# Patient Record
Sex: Male | Born: 1960 | Race: Black or African American | Hispanic: No | Marital: Married | Smoking: Former smoker
Health system: Southern US, Community
[De-identification: ages and names within clinical notes are randomized; demographics above are authoritative.]

## PROBLEM LIST (undated history)

## (undated) DIAGNOSIS — I5043 Acute on chronic combined systolic (congestive) and diastolic (congestive) heart failure: Secondary | ICD-10-CM

## (undated) DIAGNOSIS — I2510 Atherosclerotic heart disease of native coronary artery without angina pectoris: Secondary | ICD-10-CM

## (undated) DIAGNOSIS — E782 Mixed hyperlipidemia: Secondary | ICD-10-CM

## (undated) DIAGNOSIS — I10 Essential (primary) hypertension: Secondary | ICD-10-CM

## (undated) DIAGNOSIS — F1010 Alcohol abuse, uncomplicated: Secondary | ICD-10-CM

## (undated) DIAGNOSIS — I160 Hypertensive urgency: Secondary | ICD-10-CM

## (undated) DIAGNOSIS — I509 Heart failure, unspecified: Secondary | ICD-10-CM

## (undated) DIAGNOSIS — I219 Acute myocardial infarction, unspecified: Secondary | ICD-10-CM

## (undated) DIAGNOSIS — I340 Nonrheumatic mitral (valve) insufficiency: Secondary | ICD-10-CM

## (undated) DIAGNOSIS — I429 Cardiomyopathy, unspecified: Secondary | ICD-10-CM

## (undated) DIAGNOSIS — J449 Chronic obstructive pulmonary disease, unspecified: Secondary | ICD-10-CM

## (undated) DIAGNOSIS — E669 Obesity, unspecified: Secondary | ICD-10-CM

## (undated) DIAGNOSIS — E119 Type 2 diabetes mellitus without complications: Secondary | ICD-10-CM

## (undated) DIAGNOSIS — J9601 Acute respiratory failure with hypoxia: Secondary | ICD-10-CM

## (undated) HISTORY — PX: OTHER SURGICAL HISTORY: SHX169

## (undated) HISTORY — DX: Obesity, unspecified: E66.9

## (undated) HISTORY — DX: Acute on chronic combined systolic (congestive) and diastolic (congestive) heart failure: I50.43

## (undated) HISTORY — PX: CARDIAC CATHETERIZATION: SHX172

## (undated) HISTORY — DX: Acute respiratory failure with hypoxia: J96.01

## (undated) HISTORY — DX: Hypertensive urgency: I16.0

## (undated) HISTORY — DX: Type 2 diabetes mellitus without complications: E11.9

---

## 2001-09-08 ENCOUNTER — Ambulatory Visit (HOSPITAL_COMMUNITY): Admission: RE | Admit: 2001-09-08 | Discharge: 2001-09-08 | Payer: Self-pay | Admitting: Family Medicine

## 2001-09-08 ENCOUNTER — Encounter: Payer: Self-pay | Admitting: Family Medicine

## 2003-06-02 ENCOUNTER — Encounter: Payer: Self-pay | Admitting: *Deleted

## 2003-06-02 ENCOUNTER — Emergency Department (HOSPITAL_COMMUNITY): Admission: EM | Admit: 2003-06-02 | Discharge: 2003-06-02 | Payer: Self-pay | Admitting: *Deleted

## 2004-12-29 ENCOUNTER — Ambulatory Visit: Payer: Self-pay | Admitting: Family Medicine

## 2005-01-07 ENCOUNTER — Ambulatory Visit: Payer: Self-pay | Admitting: Family Medicine

## 2005-02-09 ENCOUNTER — Ambulatory Visit: Payer: Self-pay | Admitting: Family Medicine

## 2005-03-13 ENCOUNTER — Ambulatory Visit: Payer: Self-pay | Admitting: Family Medicine

## 2005-05-20 ENCOUNTER — Ambulatory Visit: Payer: Self-pay | Admitting: Family Medicine

## 2005-12-29 ENCOUNTER — Ambulatory Visit: Payer: Self-pay | Admitting: Family Medicine

## 2005-12-31 ENCOUNTER — Encounter (INDEPENDENT_AMBULATORY_CARE_PROVIDER_SITE_OTHER): Payer: Self-pay | Admitting: *Deleted

## 2005-12-31 LAB — CONVERTED CEMR LAB: PSA: 0.33 ng/mL

## 2006-01-06 ENCOUNTER — Ambulatory Visit: Payer: Self-pay | Admitting: *Deleted

## 2006-01-19 ENCOUNTER — Encounter (HOSPITAL_COMMUNITY): Admission: RE | Admit: 2006-01-19 | Discharge: 2006-02-18 | Payer: Self-pay | Admitting: *Deleted

## 2006-01-19 ENCOUNTER — Ambulatory Visit: Payer: Self-pay | Admitting: *Deleted

## 2006-01-21 ENCOUNTER — Ambulatory Visit: Payer: Self-pay | Admitting: *Deleted

## 2006-02-02 ENCOUNTER — Ambulatory Visit: Payer: Self-pay | Admitting: Family Medicine

## 2006-04-22 ENCOUNTER — Ambulatory Visit: Payer: Self-pay | Admitting: Family Medicine

## 2006-04-26 ENCOUNTER — Ambulatory Visit: Payer: Self-pay | Admitting: Family Medicine

## 2006-05-12 ENCOUNTER — Ambulatory Visit: Payer: Self-pay | Admitting: Family Medicine

## 2007-01-20 ENCOUNTER — Ambulatory Visit: Payer: Self-pay | Admitting: Family Medicine

## 2007-03-02 ENCOUNTER — Ambulatory Visit: Payer: Self-pay | Admitting: Family Medicine

## 2007-03-04 ENCOUNTER — Encounter: Payer: Self-pay | Admitting: Family Medicine

## 2007-03-04 LAB — CONVERTED CEMR LAB
Bilirubin Urine: NEGATIVE
Ketones, ur: NEGATIVE mg/dL
Specific Gravity, Urine: 1.022 (ref 1.005–1.03)
Urine Glucose: NEGATIVE mg/dL
Urobilinogen, UA: 1 (ref 0.0–1.0)
pH: 6 (ref 5.0–8.0)

## 2007-03-07 ENCOUNTER — Encounter (INDEPENDENT_AMBULATORY_CARE_PROVIDER_SITE_OTHER): Payer: Self-pay | Admitting: *Deleted

## 2007-03-07 ENCOUNTER — Encounter: Payer: Self-pay | Admitting: Family Medicine

## 2007-03-07 ENCOUNTER — Ambulatory Visit (HOSPITAL_COMMUNITY): Admission: RE | Admit: 2007-03-07 | Discharge: 2007-03-07 | Payer: Self-pay | Admitting: Family Medicine

## 2007-03-07 LAB — CONVERTED CEMR LAB
Albumin: 4.5 g/dL (ref 3.5–5.2)
Alkaline Phosphatase: 74 units/L (ref 39–117)
CO2: 24 meq/L (ref 19–32)
Calcium: 9.4 mg/dL (ref 8.4–10.5)
Creatinine, Ser: 1.04 mg/dL (ref 0.40–1.50)
Hgb A1c MFr Bld: 6.7 % — ABNORMAL HIGH (ref 4.6–6.1)
Indirect Bilirubin: 0.9 mg/dL (ref 0.0–0.9)
LDL Cholesterol: 91 mg/dL (ref 0–99)
Sodium: 139 meq/L (ref 135–145)
Total Bilirubin: 1.1 mg/dL (ref 0.3–1.2)
Total Protein: 7.4 g/dL (ref 6.0–8.3)
VLDL: 59 mg/dL — ABNORMAL HIGH (ref 0–40)

## 2007-03-09 ENCOUNTER — Ambulatory Visit (HOSPITAL_COMMUNITY): Admission: RE | Admit: 2007-03-09 | Discharge: 2007-03-09 | Payer: Self-pay | Admitting: Family Medicine

## 2007-08-03 ENCOUNTER — Ambulatory Visit: Payer: Self-pay | Admitting: Family Medicine

## 2007-12-03 ENCOUNTER — Encounter (INDEPENDENT_AMBULATORY_CARE_PROVIDER_SITE_OTHER): Payer: Self-pay | Admitting: *Deleted

## 2008-02-08 ENCOUNTER — Ambulatory Visit: Payer: Self-pay | Admitting: Family Medicine

## 2008-02-09 ENCOUNTER — Ambulatory Visit: Payer: Self-pay | Admitting: Internal Medicine

## 2008-02-10 ENCOUNTER — Ambulatory Visit: Payer: Self-pay | Admitting: Cardiology

## 2008-02-10 ENCOUNTER — Encounter (HOSPITAL_COMMUNITY): Admission: RE | Admit: 2008-02-10 | Discharge: 2008-03-11 | Payer: Self-pay | Admitting: Internal Medicine

## 2008-03-05 ENCOUNTER — Ambulatory Visit: Payer: Self-pay | Admitting: Internal Medicine

## 2008-03-05 ENCOUNTER — Ambulatory Visit: Admission: RE | Admit: 2008-03-05 | Discharge: 2008-03-05 | Payer: Self-pay | Admitting: Cardiology

## 2008-03-05 ENCOUNTER — Ambulatory Visit (HOSPITAL_COMMUNITY): Admission: RE | Admit: 2008-03-05 | Discharge: 2008-03-05 | Payer: Self-pay | Admitting: Cardiology

## 2008-03-07 ENCOUNTER — Inpatient Hospital Stay (HOSPITAL_BASED_OUTPATIENT_CLINIC_OR_DEPARTMENT_OTHER): Admission: RE | Admit: 2008-03-07 | Discharge: 2008-03-07 | Payer: Self-pay | Admitting: Cardiology

## 2008-03-07 ENCOUNTER — Ambulatory Visit: Payer: Self-pay | Admitting: Cardiology

## 2008-03-08 ENCOUNTER — Ambulatory Visit: Payer: Self-pay | Admitting: Family Medicine

## 2008-03-08 DIAGNOSIS — I10 Essential (primary) hypertension: Secondary | ICD-10-CM

## 2008-03-08 DIAGNOSIS — Z794 Long term (current) use of insulin: Secondary | ICD-10-CM

## 2008-03-08 DIAGNOSIS — E782 Mixed hyperlipidemia: Secondary | ICD-10-CM

## 2008-03-08 DIAGNOSIS — E1165 Type 2 diabetes mellitus with hyperglycemia: Secondary | ICD-10-CM

## 2008-03-14 ENCOUNTER — Ambulatory Visit: Payer: Self-pay | Admitting: Family Medicine

## 2008-03-14 ENCOUNTER — Ambulatory Visit (HOSPITAL_COMMUNITY): Admission: RE | Admit: 2008-03-14 | Discharge: 2008-03-14 | Payer: Self-pay | Admitting: Family Medicine

## 2008-03-14 LAB — CONVERTED CEMR LAB
Albumin: 4.6 g/dL (ref 3.5–5.2)
Bilirubin, Direct: 0.2 mg/dL (ref 0.0–0.3)
Cholesterol: 183 mg/dL (ref 0–200)
HDL: 27 mg/dL — ABNORMAL LOW (ref >39)
LDL Cholesterol: 90 mg/dL (ref 0–99)
Potassium: 3.6 meq/L (ref 3.5–5.3)
Sodium: 142 meq/L (ref 135–145)
Total Protein: 7.4 g/dL (ref 6.0–8.3)

## 2008-03-22 ENCOUNTER — Ambulatory Visit: Payer: Self-pay | Admitting: Internal Medicine

## 2008-08-01 ENCOUNTER — Telehealth: Payer: Self-pay | Admitting: Family Medicine

## 2008-09-21 ENCOUNTER — Encounter: Payer: Self-pay | Admitting: Family Medicine

## 2008-10-17 ENCOUNTER — Ambulatory Visit: Payer: Self-pay | Admitting: Family Medicine

## 2008-10-19 ENCOUNTER — Encounter: Payer: Self-pay | Admitting: Family Medicine

## 2008-10-19 LAB — CONVERTED CEMR LAB
Creatinine, Urine: 161.9 mg/dL
Microalb, Ur: 2.24 mg/dL — ABNORMAL HIGH (ref 0.00–1.89)

## 2008-11-12 ENCOUNTER — Ambulatory Visit: Payer: Self-pay | Admitting: Family Medicine

## 2008-11-17 ENCOUNTER — Encounter: Payer: Self-pay | Admitting: Family Medicine

## 2008-11-17 LAB — CONVERTED CEMR LAB
ALT: 34 units/L (ref 0–53)
AST: 21 units/L (ref 0–37)
Alkaline Phosphatase: 72 units/L (ref 39–117)
BUN: 20 mg/dL (ref 6–23)
Bilirubin, Direct: 0.2 mg/dL (ref 0.0–0.3)
Chloride: 103 meq/L (ref 96–112)
Cholesterol: 168 mg/dL (ref 0–200)
Creatinine, Ser: 1.16 mg/dL (ref 0.40–1.50)
Glucose, Bld: 108 mg/dL — ABNORMAL HIGH (ref 70–99)
HDL: 37 mg/dL — ABNORMAL LOW (ref >39)
Total CHOL/HDL Ratio: 4.5
VLDL: 44 mg/dL — ABNORMAL HIGH (ref 0–40)

## 2008-12-20 ENCOUNTER — Ambulatory Visit: Payer: Self-pay | Admitting: Family Medicine

## 2008-12-20 LAB — CONVERTED CEMR LAB: Glucose, Bld: 196 mg/dL

## 2009-01-21 ENCOUNTER — Ambulatory Visit: Payer: Self-pay | Admitting: Family Medicine

## 2009-01-21 LAB — CONVERTED CEMR LAB: Glucose, Bld: 152 mg/dL

## 2009-02-07 ENCOUNTER — Encounter: Payer: Self-pay | Admitting: Family Medicine

## 2009-03-05 ENCOUNTER — Ambulatory Visit: Payer: Self-pay | Admitting: Family Medicine

## 2009-03-06 ENCOUNTER — Encounter: Payer: Self-pay | Admitting: Family Medicine

## 2009-03-06 LAB — CONVERTED CEMR LAB: Microalb Creat Ratio: 11.7 mg/g (ref 0.0–30.0)

## 2009-05-06 ENCOUNTER — Telehealth: Payer: Self-pay | Admitting: Family Medicine

## 2009-05-09 ENCOUNTER — Ambulatory Visit: Payer: Self-pay | Admitting: Family Medicine

## 2009-05-09 DIAGNOSIS — J42 Unspecified chronic bronchitis: Secondary | ICD-10-CM | POA: Insufficient documentation

## 2009-05-09 LAB — CONVERTED CEMR LAB: Hgb A1c MFr Bld: 8.6 %

## 2009-07-08 ENCOUNTER — Telehealth: Payer: Self-pay | Admitting: Family Medicine

## 2009-11-21 ENCOUNTER — Ambulatory Visit: Payer: Self-pay | Admitting: Family Medicine

## 2009-12-11 ENCOUNTER — Encounter: Payer: Self-pay | Admitting: Family Medicine

## 2009-12-19 LAB — CONVERTED CEMR LAB
ALT: 28 units/L (ref 0–53)
Basophils Absolute: 0 10*3/uL (ref 0.0–0.1)
Basophils Relative: 1 % (ref 0–1)
Calcium: 9.2 mg/dL (ref 8.4–10.5)
Chloride: 100 meq/L (ref 96–112)
Cholesterol: 247 mg/dL — ABNORMAL HIGH (ref 0–200)
HCT: 45.5 % (ref 39.0–52.0)
HDL: 37 mg/dL — ABNORMAL LOW (ref >39)
Hemoglobin: 15.5 g/dL (ref 13.0–17.0)
Lymphocytes Relative: 39 % (ref 12–46)
MCV: 86.2 fL (ref 78.0–100.0)
Monocytes Absolute: 0.5 10*3/uL (ref 0.1–1.0)
Platelets: 196 10*3/uL (ref 150–400)
Potassium: 3.7 meq/L (ref 3.5–5.3)
Sodium: 137 meq/L (ref 135–145)
Total CHOL/HDL Ratio: 6.7
Total Protein: 7 g/dL (ref 6.0–8.3)
Triglycerides: 777 mg/dL — ABNORMAL HIGH (ref <150)

## 2010-01-20 ENCOUNTER — Telehealth: Payer: Self-pay | Admitting: Family Medicine

## 2010-05-06 ENCOUNTER — Ambulatory Visit: Payer: Self-pay | Admitting: Family Medicine

## 2010-05-07 ENCOUNTER — Encounter: Payer: Self-pay | Admitting: Family Medicine

## 2010-05-09 ENCOUNTER — Encounter: Payer: Self-pay | Admitting: Family Medicine

## 2010-05-09 LAB — CONVERTED CEMR LAB: Direct LDL: 58 mg/dL

## 2010-05-14 ENCOUNTER — Encounter: Payer: Self-pay | Admitting: Family Medicine

## 2010-05-19 LAB — CONVERTED CEMR LAB
AST: 17 units/L (ref 0–37)
Alkaline Phosphatase: 74 units/L (ref 39–117)
Bilirubin, Direct: 0.1 mg/dL (ref 0.0–0.3)
Calcium: 9.9 mg/dL (ref 8.4–10.5)
Chloride: 102 meq/L (ref 96–112)
Creatinine, Ser: 1.11 mg/dL (ref 0.40–1.50)
HDL: 37 mg/dL — ABNORMAL LOW (ref >39)
Hgb A1c MFr Bld: 9 % — ABNORMAL HIGH (ref <5.7)
Microalb Creat Ratio: 11.9 mg/g (ref 0.0–30.0)
Potassium: 4.2 meq/L (ref 3.5–5.3)
Sodium: 139 meq/L (ref 135–145)
Total CHOL/HDL Ratio: 6.2

## 2010-07-03 ENCOUNTER — Encounter (INDEPENDENT_AMBULATORY_CARE_PROVIDER_SITE_OTHER): Payer: Self-pay | Admitting: *Deleted

## 2010-08-06 ENCOUNTER — Ambulatory Visit: Payer: Self-pay | Admitting: Family Medicine

## 2010-08-12 ENCOUNTER — Encounter: Payer: Self-pay | Admitting: Family Medicine

## 2010-08-12 DIAGNOSIS — E663 Overweight: Secondary | ICD-10-CM

## 2010-08-12 LAB — CONVERTED CEMR LAB
ALT: 27 units/L (ref 0–53)
BUN: 18 mg/dL (ref 6–23)
CO2: 25 meq/L (ref 19–32)
Calcium: 9.7 mg/dL (ref 8.4–10.5)
Chloride: 101 meq/L (ref 96–112)
Hgb A1c MFr Bld: 10.2 % — ABNORMAL HIGH (ref <5.7)
Indirect Bilirubin: 0.7 mg/dL (ref 0.0–0.9)
Potassium: 3.4 meq/L — ABNORMAL LOW (ref 3.5–5.3)
Sodium: 139 meq/L (ref 135–145)

## 2010-10-01 ENCOUNTER — Telehealth: Payer: Self-pay | Admitting: Family Medicine

## 2010-11-11 ENCOUNTER — Encounter: Payer: Self-pay | Admitting: Family Medicine

## 2010-11-13 ENCOUNTER — Ambulatory Visit: Payer: Self-pay | Admitting: Family Medicine

## 2010-11-13 LAB — CONVERTED CEMR LAB
Bilirubin Urine: NEGATIVE
Nitrite: NEGATIVE
Protein, U semiquant: NEGATIVE
Specific Gravity, Urine: 1.01
WBC Urine, dipstick: NEGATIVE

## 2010-11-14 ENCOUNTER — Encounter: Payer: Self-pay | Admitting: Family Medicine

## 2010-11-14 LAB — CONVERTED CEMR LAB
Alkaline Phosphatase: 87 units/L (ref 39–117)
BUN: 19 mg/dL (ref 6–23)
CO2: 25 meq/L (ref 19–32)
Calcium: 9.7 mg/dL (ref 8.4–10.5)
Chloride: 96 meq/L (ref 96–112)
Creatinine, Ser: 1.13 mg/dL (ref 0.40–1.50)
Glucose, Bld: 284 mg/dL — ABNORMAL HIGH (ref 70–99)
Sodium: 134 meq/L — ABNORMAL LOW (ref 135–145)
Total Bilirubin: 0.8 mg/dL (ref 0.3–1.2)

## 2010-12-12 ENCOUNTER — Telehealth: Payer: Self-pay | Admitting: Family Medicine

## 2010-12-22 ENCOUNTER — Ambulatory Visit
Admission: RE | Admit: 2010-12-22 | Discharge: 2010-12-22 | Payer: Self-pay | Source: Home / Self Care | Attending: Family Medicine | Admitting: Family Medicine

## 2010-12-29 ENCOUNTER — Ambulatory Visit
Admission: RE | Admit: 2010-12-29 | Discharge: 2010-12-29 | Payer: Self-pay | Source: Home / Self Care | Attending: Family Medicine | Admitting: Family Medicine

## 2010-12-29 LAB — CONVERTED CEMR LAB: Glucose, Bld: 187 mg/dL

## 2011-01-04 ENCOUNTER — Encounter: Payer: Self-pay | Admitting: Family Medicine

## 2011-01-11 LAB — CONVERTED CEMR LAB: Glucose, Bld: 133 mg/dL

## 2011-01-13 NOTE — Progress Notes (Signed)
Summary: refill  Phone Note Call from Patient   Summary of Call: needs sugar medicine called into [facility]. [phone] Initial call taken by: Rudene Anda,  October 01, 2010 8:28 AM    Prescriptions: KOMBIGLYZE XR 2.04-999 MG XR24H-TAB (SAXAGLIPTIN-METFORMIN) 2 tablets once daily  #60 x 1   Entered by:   Adella Hare LPN   Authorized by:   Syliva Overman MD   Signed by:   Adella Hare LPN on 45/40/9811   Method used:   Electronically to        [facility]  [address] (retail)       [address]       [place], [place]  [address]       Ph: [phone]       Fax: [phone]   RxID:   [id number]

## 2011-01-13 NOTE — Letter (Signed)
Summary: Letter  Letter   Imported By: Lind Guest 05/14/2010 15:32:36  _____________________________________________________________________  External Attachment:    Type:   Image     Comment:   External Document

## 2011-01-13 NOTE — Letter (Addendum)
Summary: Laboratory/X-Ray Results  [facility]  [address]   [place], [place] [address]   Phone: [phone]  Fax: [phone]    Lab/X-Ray Results  November 14, 2010  MRN: [id number]  [facility] [address] [place], [place]  [address]  Dear Mr. PERCIVAL,  The results of your recent lab has been reviewed and were found: ABNORMAL     I have attempted unsuccessfully to reach you by telephone. Please contact our office.     Adella Hare LPN  Appended Document: Laboratory/X-Ray Results when you spk to the pt,pls find out what his fasting sgars anfd other are,let me know if still over 200 most of the time, i will add extr glucotrol i

## 2011-01-13 NOTE — Assessment & Plan Note (Signed)
Summary: office visit   Vital Signs:  Patient profile:   50 year old male Height:      70 inches Weight:      206.25 pounds BMI:     29.70 O2 Sat:      97 % Pulse rate:   91 / minute Pulse rhythm:   regular Resp:     16 per minute BP sitting:   120 / 90  (left arm)  Vitals Entered By: Everitt Amber LPN (August 06, 2010 4:07 PM)  Nutrition Counseling: Patient's BMI is greater than 25 and therefore counseled on weight management options. CC: Follow up chronic problems   CC:  Follow up chronic problems.  History of Present Illness: Reports  that he has been doing well, staes he both drinks and smokes less Denies recent fever or chills. Denies sinus pressure, nasal congestion , ear pain or sore throat. Denies chest congestion, or cough productive of sputum. Denies chest pain, palpitations, PND, orthopnea or leg swelling. Denies abdominal pain, nausea, vomitting, diarrhea or constipation. Denies change in bowel movements or bloody stool. Denies dysuria , frequency, incontinence or hesitancy. Denies  joint pain, swelling, or reduced mobility. Denies headaches, vertigo, seizures. Denies depression, anxiety or insomnia. Denies  rash, lesions, or itch.     Current Medications (verified): 1)  Avalide 300-25 Mg  Tabs (Irbesartan-Hydrochlorothiazide) .... One Tab By Mouth Every Morning 2)  Proventil Hfa 108 (90 Base) Mcg/act Aers (Albuterol Sulfate) .... 2 Puffs Every 6 To 8 Hours As Needed 3)  Metformin Hcl 1000 Mg Tabs (Metformin Hcl) .... Take 1 Tablet By Mouth Two Times A Day 4)  Glipizide 5 Mg Tabs (Glipizide) .... Take 1 Tablet By Mouth Two Times A Day 5)  Amlodipine Besylate 10 Mg Tabs (Amlodipine Besylate) .... One Tab By Mouth Qd 6)  Spironolactone 25 Mg Tabs (Spironolactone) .... One Tab By Mouth Qd 7)  Metoprolol Succinate 50 Mg Xr24h-Tab (Metoprolol Succinate) .... One Tab By Mouth Once Daily 8)  Klor-Con M20 20 Meq Cr-Tabs (Potassium Chloride Crys Cr) .... One Tab By  Mouth Qd 9)  Vitamin E 200 Unit Caps (Vitamin E) .... One Cap By Mouth Qd 10)  Fish Oil 1000 Mg Caps (Omega-3 Fatty Acids) .... One Cap By Mouth Qd 11)  Garlic 500 Mg Caps (Garlic) .... One Tab By Mouth Qd 12)  Adult Aspirin Low Strength 81 Mg Tbdp (Aspirin) .... One Tab By Mouth Qd 13)  Simvastatin 40 Mg Tabs (Simvastatin) .... Take 1 Tab By Mouth At Bedtime 14)  Hydrochlorothiazide 25 Mg Tabs (Hydrochlorothiazide) .[name].. 1 Daily Q Am 15)  Avapro 300 Mg Tabs (Irbesartan) .[name].. 1 Daily  Allergies (verified): No Known Drug Allergies  Review of Systems      See HPI General:  Complains of fatigue. Eyes:  Complains of blurring; denies discharge, eye pain, and red eye. Endo:  Complains of excessive thirst; denies excessive urination; not testing sugars regularly. Heme:  Denies abnormal bruising and bleeding. Allergy:  Complains of seasonal allergies; denies hives or rash and itching eyes.  Physical Exam  General:  Well-developed,obese,in no acute distress; alert,appropriate and cooperative throughout examination HEENT: No facial asymmetry,  EOMI, No sinus tenderness, TM's Clear, oropharynx  pink and moist.   Chest: Clear to auscultation bilaterally.  CVS: S1, S2, No murmurs, No S3.   Abd: Soft, Nontender.  MS: Adequate ROM spine, hips, shoulders and knees.  Ext: No edema.   CNS: CN 2-12 intact, power tone and sensation normal throughout.  Skin: Intact, no visible lesions or rashes.  Psych: Good eye contact, normal affect.  Memory intact, not anxious or depressed appearing.    Impression & Recommendations:  Problem # 1:  OVERWEIGHT (ICD-278.02) Assessment Unchanged  Ht: 70 (08/06/2010)   Wt: 206.25 (08/06/2010)   BMI: 29.70 (08/06/2010)  Problem # 2:  NICOTINE ADDICTION (ICD-305.1) Assessment: Unchanged  Encouraged smoking cessation and discussed different methods for smoking cessation.   Problem # 3:  HYPERTENSION (ICD-401.9) Assessment: Improved  His updated medication  list for this problem includes:    Avalide 300-25 Mg Tabs (Irbesartan-hydrochlorothiazide) ..... One tab by mouth every morning    Amlodipine Besylate 10 Mg Tabs (Amlodipine besylate) ..... One tab by mouth qd    Spironolactone 25 Mg Tabs (Spironolactone) ..... One tab by mouth qd    Metoprolol Succinate 50 Mg Xr24h-tab (Metoprolol succinate) ..... One tab by mouth once daily    Hydrochlorothiazide 25 Mg Tabs (Hydrochlorothiazide) .[name]... 1 daily q am    Avapro 300 Mg Tabs (Irbesartan) .[name]... 1 daily  Orders: T-Basic Metabolic Panel [id number]) T-Basic Metabolic Panel [phone])  BP today: 120/90 Prior BP: 136/88 (05/06/2010)  Labs Reviewed: K+: 4.2 (05/07/2010) Creat: : 1.11 (05/07/2010)   Chol: 230 (05/07/2010)   HDL: 37 (05/07/2010)   LDL: See Comment mg/dL (29/56/2130)   TG: 865 (05/07/2010)  Problem # 4:  DIABETES MELLITUS, TYPE II, WITHOUT COMPLICATIONS (ICD-250.00) Assessment: Deteriorated  The following medications were removed from the medication list:    Metformin Hcl 1000 Mg Tabs (Metformin hcl) .[name]... Take 1 tablet by mouth two times a day His updated medication list for this problem includes:    Avalide 300-25 Mg Tabs (Irbesartan-hydrochlorothiazide) ..... One tab by mouth every morning    Glipizide 5 Mg Tabs (Glipizide) .[name]... Take 1 tablet by mouth two times a day    Adult Aspirin Low Strength 81 Mg Tbdp (Aspirin) ..... One tab by mouth qd    Avapro 300 Mg Tabs (Irbesartan) .[name]... 1 daily    Kombiglyze Xr 2.04-999 Mg Xr24h-tab (Saxagliptin-metformin) .[name]... 2 tablets once daily  Orders: T- Hemoglobin A1C ([id number]) T- Hemoglobin A1C ([id number])  Labs Reviewed: Creat: 1.11 (05/07/2010)    Reviewed HgBA1c results: 9.0 (05/07/2010)  9.5 (11/21/2009)  Complete Medication List: 1)  Avalide 300-25 Mg Tabs (Irbesartan-hydrochlorothiazide) .... One tab by mouth every morning 2)  Proventil Hfa 108 (90 Base) Mcg/act Aers (Albuterol sulfate) .... 2 puffs every 6 to 8  hours as needed 3)  Glipizide 5 Mg Tabs (Glipizide) .... Take 1 tablet by mouth two times a day 4)  Amlodipine Besylate 10 Mg Tabs (Amlodipine besylate) .... One tab by mouth qd 5)  Spironolactone 25 Mg Tabs (Spironolactone) .... One tab by mouth qd 6)  Metoprolol Succinate 50 Mg Xr24h-tab (Metoprolol succinate) .... One tab by mouth once daily 7)  Klor-con M20 20 Meq Cr-tabs (Potassium chloride crys cr) .... One tab by mouth qd 8)  Vitamin E 200 Unit Caps (Vitamin e) .... One cap by mouth qd 9)  Fish Oil 1000 Mg Caps (Omega-3 fatty acids) .... One cap by mouth qd 10)  Garlic 500 Mg Caps (Garlic) .... One tab by mouth qd 11)  Adult Aspirin Low Strength 81 Mg Tbdp (Aspirin) .... One tab by mouth qd 12)  Hydrochlorothiazide 25 Mg Tabs (Hydrochlorothiazide) .[name].. 1 daily q am 13)  Avapro 300 Mg Tabs (Irbesartan) .[name].. 1 daily 14)  Lipitor 40 Mg Tabs (Atorvastatin calcium) .... Take 1 tab by mouth at bedtime 15)  Tessalon Perles 100 Mg Caps (Benzonatate) .... Take 1 capsule by mouth three times a day 16)  Penicillin V Potassium 500 Mg Tabs (Penicillin v potassium) .... Take 1 tablet by mouth three times a day 17)  Kombiglyze Xr 2.04-999 Mg Xr24h-tab (Saxagliptin-metformin) .... 2 tablets once daily  Other Orders: T-Hepatic Function [id number]) T-Hepatic Function [phone]) T-Lipid Profile [id number])  Patient Instructions: 1)  Please schedule a follow-up appointment in 3 months. 2)  It is important that you exercise regularly at least 20 minutes 5 times a week. If you develop chest pain, have severe difficulty breathing, or feel very tired , stop exercising immediately and seek medical attention. 3)  You need to lose weight. Consider a lower calorie diet and regular exercise.  4)  It is not healthy  for men to drink more than 2-3 drinks per day or for women to drink more than 1-2 drinks per day. 5)  BMP prior to visit, ICD-9: 6)  Hepatic Panel prior to visit, ICD-9:   today 7)  HbgA1C  prior to visit, ICD-9: 8)  PLS change your eating as discussed Prescriptions: KOMBIGLYZE XR 2.04-999 MG XR24H-TAB (SAXAGLIPTIN-METFORMIN) 2 tablets once daily  #60 x 3   Entered and Authorized by:   Syliva Overman MD   Signed by:   Syliva Overman MD on 08/07/2010   Method used:   Historical   RxID:   [id number] AVAPRO 300 MG TABS (IRBESARTAN) 1 daily  #90 x 3   Entered by:   Adella Hare LPN   Authorized by:   Syliva Overman MD   Signed by:   Adella Hare LPN on 44/12/270   Method used:   Electronically to        [facility] (retail)       [address]       [place], [place]  [address]       Ph: [phone]       Fax: [phone]   RxID:   [id number] HYDROCHLOROTHIAZIDE 25 MG TABS (HYDROCHLOROTHIAZIDE) 1 daily q am  #90 x 3   Entered by:   Adella Hare LPN   Authorized by:   Syliva Overman MD   Signed by:   Adella Hare LPN on 10/93/2355   Method used:   Electronically to        [facility] (retail)       [address]       [place], [place]  [address]       Ph: [phone]       Fax: [phone]   RxID:   [id number] KLOR-CON M20 20 MEQ CR-TABS (POTASSIUM CHLORIDE CRYS CR) one tab by mouth qd  #30 Tablet x 3   Entered by:   Adella Hare LPN   Authorized by:   Syliva Overman MD   Signed by:   Adella Hare LPN on 27/02/5008   Method used:   Electronically to        [facility] [address] (retail)       [address]       [place], [place]  [address]       Ph: [phone]       Fax: [phone]   RxID:   [id number] METOPROLOL SUCCINATE 50 MG XR24H-TAB (METOPROLOL SUCCINATE) one tab by mouth once daily  #30 Tablet x 3  Entered by:   Adella Hare LPN   Authorized by:   Syliva Overman MD   Signed by:   Adella Hare LPN on 36/64/4034   Method used:   Electronically to        [facility] [address] (retail)       [address]       [place], [place]  [address]       Ph: [phone]       Fax: [phone]   RxID:   [id number] AMLODIPINE BESYLATE 10 MG TABS (AMLODIPINE BESYLATE) one tab by mouth qd  #30 Tablet x 3   Entered by:   Adella Hare LPN   Authorized by:   Syliva Overman MD   Signed by:   Adella Hare LPN on 32/20/2542   Method used:   Electronically to        [facility] [address] (retail)       [address]       [place], [place]  [address]       Ph: [phone]       Fax: [phone]   RxID:   [id number] PENICILLIN V POTASSIUM 500 MG TABS (PENICILLIN V POTASSIUM) Take 1 tablet by mouth three times a day  #30 x 0   Entered and Authorized by:   Syliva Overman MD   Signed by:   Syliva Overman MD on 08/06/2010   Method used:   Electronically to        [facility] [address] (retail)       [address]       [place], [place]  [address]       Ph: [phone]       Fax: [phone]   RxID:   [id number] TESSALON PERLES 100 MG CAPS (BENZONATATE) Take 1 capsule by mouth three times a day  #30 x 0   Entered and Authorized by:   Syliva Overman MD   Signed by:   Syliva Overman MD on 08/06/2010   Method used:   Electronically to        [facility] [address] (retail)       [address]       [place], [place]  [address]       Ph: [phone]       Fax: [phone]   RxID:   [id number] LIPITOR 40 MG TABS (ATORVASTATIN CALCIUM) Take 1 tab by mouth at bedtime  #30 x 3   Entered and Authorized by:   Syliva Overman MD   Signed by:   Syliva Overman MD on 08/06/2010   Method used:   Printed then faxed to ...       [facility] (retail)       [address]       [place], [place]  [address]       Ph: [phone]       Fax: [phone]   RxID:   [id number]

## 2011-01-13 NOTE — Letter (Signed)
Summary: 1st Missed Appt.  [facility]  [address]   [place], [place] [address]   Phone: [phone]  Fax: [phone]    July 03, 2010  MRN: [id number]  [facility] [address] [place], [place]  [address]  Dear Antonio Cobb,  At [facility], we make every attempt to fit patients into our schedule by reserving several appointment slots for same-day appointments.  However, we cannot always make appointments for patients the same day they are calling.  At the end of the day, we look back at our schedule and find that because of last-minute cancellations and patients not showing up for their scheduled appointments, we have several appointment slots that are left open and could have been used by another person who really needed it.  In the past, you may have been one of the patients who could not get in when you needed to.  But recently, you were one of the patients with an appointment that you didn't show up for or canceled too late for [place] to fill it.  We choose not to charge no-show or last minute cancellation fees to our patients, like many other offices do.  We do not wish to institute that policy and hope we never have to.  However, we kindly request that you assist [place] by providing at least 24 hours' notice if you can't make your appointment.  If no-shows or late cancellations become habitual (i.e. Three or more in a one-year period), we may terminate the physician-patient relationship.    Thank you for your consideration and cooperation.   Luann Bullins

## 2011-01-13 NOTE — Assessment & Plan Note (Signed)
Summary: OV   Vital Signs:  Patient profile:   50 year old male Height:      70 inches Weight:      197.50 pounds BMI:     28.44 O2 Sat:      98 % on Room air Pulse rate:   93 / minute Pulse rhythm:   regular Resp:     16 per minute BP sitting:   140 / 100  (left arm)  Vitals Entered By: Adella Hare LPN (November 13, 2010 4:02 PM)  Nutrition Counseling: Patient's BMI is greater than 25 and therefore counseled on weight management options.  O2 Flow:  Room air CC: sugars are high Is Patient Diabetic? Yes Did you bring your meter with you today? No Pain Assessment Patient in pain? no      Comments did not bring meds to ov    CC:  sugars are high.  History of Present Illness: 3 weeks h/o inc fatigue, poyuria, polydypsia, dry mouth, sugar too high to read. Pt continues to drink alcohol, and remaijns non compliant with the treatment plan. He denies any recent fever or chills, head or chest congestion. He denies dysuria. He denies depression or anxiety. He denies abdominal pain, diarreah,nausea,vomitting or constipation. He denies joint pain, stiffness, or swelling. He denies rash, lesion , skin breakdown or itch.  Preventive Screening-Counseling & Management  Alcohol-Tobacco     Smoking Cessation Counseling: yes  Allergies (verified): No Known Drug Allergies  Review of Systems      See HPI General:  Complains of fatigue, weakness, and weight loss; denies chills and fever; 1 wek history. Eyes:  Complains of blurring; denies eye pain and red eye. ENT:  Denies earache, hoarseness, nasal congestion, postnasal drainage, and sinus pressure. CV:  Denies chest pain or discomfort, near fainting, palpitations, shortness of breath with exertion, and swelling of feet. Resp:  Complains of cough; denies shortness of breath, sputum productive, and wheezing; chronic smokers cough. GI:  Denies abdominal pain, constipation, diarrhea, nausea, and vomiting. GU:  Complains of urinary  frequency; denies dysuria and urinary hesitancy. Neuro:  Denies headaches, memory loss, seizures, and sensation of room spinning. Endo:  Complains of excessive thirst, excessive urination, and weight change; dry mouth, nasty taste in mouth for 5 days. Heme:  Denies abnormal bruising and bleeding. Allergy:  Denies hives or rash and itching eyes.  Physical Exam  General:  Well-developed,well-nourished,in no acute distress; alert,appropriate and cooperative throughout examination HEENT: No facial asymmetry,  EOMI, No sinus tenderness, TM's Clear, oropharynx  pink and moist.   Chest: Clear to auscultation bilaterally.  CVS: S1, S2, No murmurs, No S3.   Abd: Soft, Nontender.  MS: Adequate ROM spine, hips, shoulders and knees.  Ext: No edema.   CNS: CN 2-12 intact, power tone and sensation normal throughout.   Skin: Intact, no visible lesions or rashes.  Psych: Good eye contact, normal affect.  Memory intact, not anxious or depressed appearing.    Impression & Recommendations:  Problem # 1:  ALCOHOL USE (ICD-305.00) Assessment Unchanged the absolute need to discontinue alcohol use is again stressed  Problem # 2:  NICOTINE ADDICTION (ICD-305.1) Assessment: Unchanged  Encouraged smoking cessation and discussed different methods for smoking cessation.   Problem # 3:  DIABETES MELLITUS, TYPE II, WITHOUT COMPLICATIONS (ICD-250.00) Assessment: Deteriorated  The following medications were removed from the medication list:    Glipizide 5 Mg Tabs (Glipizide) .[name]... Take 1 tablet by mouth two times a day His updated  medication list for this problem includes:    Avalide 300-25 Mg Tabs (Irbesartan-hydrochlorothiazide) ..... One tab by mouth every morning    Adult Aspirin Low Strength 81 Mg Tbdp (Aspirin) ..... One tab by mouth qd    Avapro 300 Mg Tabs (Irbesartan) .[name]... 1 daily    Kombiglyze Xr 2.04-999 Mg Xr24h-tab (Saxagliptin-metformin) .[name]... 2 tablets once daily    Glipizide 10 Mg  Xr24h-tab (Glipizide) .[name]... Take 1 tablet by mouth once a day  Orders: Glucose, (CBG) (82962) T- Hemoglobin A1C (14782-95621) Insulin 5 units (H0865) Admin of Therapeutic Inj  intramuscular or subcutaneous (78469)  Labs Reviewed: Creat: 1.10 (08/06/2010)    Reviewed HgBA1c results: 10.2 (08/06/2010)  9.0 (05/07/2010)  Problem # 4:  HYPERLIPIDEMIA (ICD-272.4) Assessment: Comment Only  His updated medication list for this problem includes:    Lipitor 40 Mg Tabs (Atorvastatin calcium) .[name]... Take 1 tab by mouth at bedtime Low fat dietdiscussed and encouraged  Labs Reviewed: SGOT: 23 (08/06/2010)   SGPT: 27 (08/06/2010)   HDL:37 (05/07/2010), 37 (12/11/2009)  LDL:See Comment mg/dL (62/95/2841), See Comment mg/dL (32/44/0102)  VOZD:664 (05/07/2010), 247 (12/11/2009)  Trig:651 (05/07/2010), 777 (12/11/2009)  Problem # 5:  HYPERTENSION (ICD-401.9) Assessment: Deteriorated  The following medications were removed from the medication list:    Spironolactone 25 Mg Tabs (Spironolactone) ..... One tab by mouth qd His updated medication list for this problem includes:    Avalide 300-25 Mg Tabs (Irbesartan-hydrochlorothiazide) ..... One tab by mouth every morning    Amlodipine Besylate 10 Mg Tabs (Amlodipine besylate) ..... One tab by mouth qd    Metoprolol Succinate 50 Mg Xr24h-tab (Metoprolol succinate) ..... One tab by mouth once daily    Hydrochlorothiazide 25 Mg Tabs (Hydrochlorothiazide) .[name]... 1 daily q am    Avapro 300 Mg Tabs (Irbesartan) .[name]... 1 daily    Spironolactone 25 Mg Tabs (Spironolactone) .[name]... Take 1 tablet by mouth once a day  Orders: T-CMP with estimated GFR ([address]) Urinalysis (81003-65000)  BP today: 140/100 Prior BP: 120/90 (08/06/2010)  Labs Reviewed: K+: 3.4 (08/06/2010) Creat: : 1.10 (08/06/2010)   Chol: 230 (05/07/2010)   HDL: 37 (05/07/2010)   LDL: See Comment mg/dL (56/38/7564)   TG: 332 (05/07/2010)  Complete Medication List: 1)  Avalide 300-25 Mg  Tabs (Irbesartan-hydrochlorothiazide) .... One tab by mouth every morning 2)  Proventil Hfa 108 (90 Base) Mcg/act Aers (Albuterol sulfate) .... 2 puffs every 6 to 8 hours as needed 3)  Amlodipine Besylate 10 Mg Tabs (Amlodipine besylate) .... One tab by mouth qd 4)  Metoprolol Succinate 50 Mg Xr24h-tab (Metoprolol succinate) .... One tab by mouth once daily 5)  Klor-con M20 20 Meq Cr-tabs (Potassium chloride crys cr) .... One tab by mouth qd 6)  Vitamin E 200 Unit Caps (Vitamin e) .... One cap by mouth qd 7)  Fish Oil 1000 Mg Caps (Omega-3 fatty acids) .... One cap by mouth qd 8)  Garlic 500 Mg Caps (Garlic) .... One tab by mouth qd 9)  Adult Aspirin Low Strength 81 Mg Tbdp (Aspirin) .... One tab by mouth qd 10)  Hydrochlorothiazide 25 Mg Tabs (Hydrochlorothiazide) .[name].. 1 daily q am 11)  Avapro 300 Mg Tabs (Irbesartan) .[name].. 1 daily 12)  Lipitor 40 Mg Tabs (Atorvastatin calcium) .... Take 1 tab by mouth at bedtime 13)  Kombiglyze Xr 2.04-999 Mg Xr24h-tab (Saxagliptin-metformin) .... 2 tablets once daily 14)  Glipizide 10 Mg Xr24h-tab (Glipizide) .... Take 1 tablet by mouth once a day 15)  Spironolactone 25 Mg Tabs (Spironolactone) .... Take  1 tablet by mouth once a day  Patient Instructions: 1)  F/u in 6 to 8 weeks 2)  BMP prior to visit, ICD-9: 3)  HbgA1C prior to visit, ICD-9:  today. 4)  PLS take glipizide 10mg  one daily at breakfast, start tomorrow 5)  Continue kombiglyze 2 daily 6)  Spironolactone is also sent in your bP is high 7)  Tobacco is very bad for your health and your loved ones! You Should stop smoking!. 8)  It is not healthy  for men to drink more than 2-3 drinks per day or for women to drink more than 1-2 drinks per day. Prescriptions: SPIRONOLACTONE 25 MG TABS (SPIRONOLACTONE) Take 1 tablet by mouth once a day  #30 x 3   Entered and Authorized by:   Syliva Overman MD   Signed by:   Syliva Overman MD on 11/13/2010   Method used:   Printed then faxed to ...        [facility] [address] [facility] (retail)       [address]       [place], [place]  [address]       Ph: [phone]       Fax: [phone]   RxID:   [id number] GLIPIZIDE 10 MG XR24H-TAB (GLIPIZIDE) Take 1 tablet by mouth once a day  #30 x 3   Entered and Authorized by:   Syliva Overman MD   Signed by:   Syliva Overman MD on 11/13/2010   Method used:   Printed then faxed to ...       [facility] [address] [facility] (retail)       [address]       [place], [place]  [address]       Ph: [phone]       Fax: [phone]   RxID:   [id number]    Medication Administration  Injection # 1:    Medication: Insulin 5 units    Diagnosis: DIABETES MELLITUS, TYPE II, WITHOUT COMPLICATIONS (ICD-250.00)    Route: SQ    Site: R deltoid    Exp Date: 11/13    Lot #: [id number]    Mfr: novo nordisk    Comments: 7 units given    Patient tolerated injection without complications    Given by: Adella Hare LPN (November 13, 2010 5:01 PM)  Orders Added: 1)  Glucose, (CBG) [82962] 2)  Est. Patient Level IV [10932] 3)  T-CMP with estimated GFR [[address]] 4)  T- Hemoglobin A1C [83036-23375] 5)  Urinalysis [81003-65000] 6)  Insulin 5 units [J1815] 7)  Admin of Therapeutic Inj  intramuscular or subcutaneous [96372]    Laboratory Results   Urine Tests  Date/Time Received: November 13, 2010 5:00 PM  Date/Time Reported: November 13, 2010 5:00 PM   Routine Urinalysis   Color: yellow Appearance: Clear Glucose: >=1000   (Normal Range: Negative) Bilirubin: negative   (Normal Range: Negative) Ketone: trace (5)   (Normal Range: Negative) Spec. Gravity: 1.010   (Normal Range: 1.003-1.035) Blood: negative   (Normal Range: Negative) pH: 5.0   (Normal Range: 5.0-8.0) Protein: negative   (Normal Range: Negative) Urobilinogen: 0.2   (Normal Range: 0-1) Nitrite: negative   (Normal Range: Negative) Leukocyte Esterace: negative   (Normal Range: Negative)     Blood  Tests   Date/Time Received: November 13, 2010 4:03 PM  Date/Time Reported: November 13, 2010 4:03 PM   Glucose (random): 385 mg/dL   (Normal Range: 16-109)        Medication Administration  Injection # 1:    Medication: Insulin 5 units    Diagnosis: DIABETES MELLITUS, TYPE II, WITHOUT COMPLICATIONS (ICD-250.00)    Route: SQ    Site: R deltoid    Exp Date: 11/13    Lot #: [id number]    Mfr: novo nordisk    Comments: 7 units given    Patient tolerated injection without complications    Given by: Adella Hare LPN (November 13, 2010 5:01 PM)  Orders Added: 1)  Glucose, (CBG) [82962] 2)  Est. Patient Level IV [81191] 3)  T-CMP with estimated GFR [[address]] 4)  T- Hemoglobin A1C [83036-23375] 5)  Urinalysis [81003-65000] 6)  Insulin 5 units [J1815] 7)  Admin of Therapeutic Inj  intramuscular or subcutaneous [47829]

## 2011-01-13 NOTE — Progress Notes (Signed)
  Phone Note From Pharmacy   Caller: [facility]  [address] Summary of Call: Adan Sis not available can this be changed to two individual drugs? Initial call taken by: Worthy Keeler LPN,  January 20, 2010 3:29 PM  Follow-up for Phone Call        Is there a manufacturing  problem or temporarily out of stock at pharmacy? Follow-up by: Esperanza Sheets PA,  January 20, 2010 4:23 PM  Additional Follow-up for Phone Call Additional follow up Details #1::        recall and not available from [facility]. Additional Follow-up by: Worthy Keeler LPN,  January 20, 2010 4:37 PM    Additional Follow-up for Phone Call Additional follow up Details #2::    E Rx'd the seperate meds to pharm.  Please advise pt that these 2 meds replace his Avalide, not to take them in addition to the Avalide. Follow-up by: Esperanza Sheets PA,  January 20, 2010 5:12 PM  New/Updated Medications: HYDROCHLOROTHIAZIDE 25 MG TABS (HYDROCHLOROTHIAZIDE) 1 daily q am AVAPRO 300 MG TABS (IRBESARTAN) 1 daily Prescriptions: AVAPRO 300 MG TABS (IRBESARTAN) 1 daily  #90 x 3   Entered and Authorized by:   Esperanza Sheets PA   Signed by:   Esperanza Sheets PA on 01/20/2010   Method used:   Electronically to        [facility] (retail)       [address]       [place], [place]  [address]       Ph: [phone]       Fax: [phone]   RxID:   [id number] HYDROCHLOROTHIAZIDE 25 MG TABS (HYDROCHLOROTHIAZIDE) 1 daily q am  #90 x 3   Entered and Authorized by:   Esperanza Sheets PA   Signed by:   Esperanza Sheets PA on 01/20/2010   Method used:   Electronically to        [facility] (retail)       [address]       [place], [place]  [address]       Ph: [phone]       Fax: [phone]   RxID:   [id number]  noted, and agree

## 2011-01-13 NOTE — Letter (Signed)
Summary: Letter  Letter   Imported By: Lind Guest 08/12/2010 12:58:41  _____________________________________________________________________  External Attachment:    Type:   Image     Comment:   External Document

## 2011-01-13 NOTE — Letter (Signed)
Summary: 1ST MISSED LETTER  1ST MISSED LETTER   Imported By: Lind Guest 11/12/2010 09:53:40  _____________________________________________________________________  External Attachment:    Type:   Image     Comment:   External Document

## 2011-01-13 NOTE — Progress Notes (Signed)
Summary: dr.knowlton  dr.knowlton   Imported By: Lind Guest 04/01/2010 10:11:55  _____________________________________________________________________  External Attachment:    Type:   Image     Comment:   External Document

## 2011-01-13 NOTE — Assessment & Plan Note (Signed)
Summary: OV   Vital Signs:  Patient profile:   50 year old male Height:      70 inches Weight:      203 pounds BMI:     29.23 O2 Sat:      97 % Pulse rate:   84 / minute Pulse rhythm:   regular Resp:     16 per minute BP sitting:   136 / 88  (left arm) Cuff size:   large  Vitals Entered By: Everitt Amber LPN (May 06, 2010 4:01 PM)  Nutrition Counseling: Patient's BMI is greater than 25 and therefore counseled on weight management options. CC: Follow up chronic problems, stopped the metformin and glipizide because everytime he took it he would get the shakes and feel really funny. States it would be in the 60's, so he stopped the diabetes meds   CC:  Follow up chronic problems, stopped the metformin and glipizide because everytime he took it he would get the shakes and feel really funny. States it would be in the 60's, and so he stopped the diabetes meds.  History of Present Illness:  Reports  that he feels well, and is hppy that he has lost weight with no effort. I explained that this was mostlikely due to uncontrolled and elevated blood sugars, which is not a good thing. Heactuillly discontinued his diabetic meds stating that they were causing his blood sugar to fall too low. He continues to smoke as well as to use alcohol, reportedly less, but unfortiunately i doobt this. Denies recent fever or chills. Denies sinus pressure, nasal congestion , ear pain or sore throat. Denies chest congestion, or cough productive of sputum. Denies chest pain, palpitations, PND, orthopnea or leg swelling. Denies abdominal pain, nausea, vomitting, diarrhea or constipation. Denies change in bowel movements or bloody stool. Denies dysuria , frequency, incontinence or hesitancy. Denies  joint pain, swelling, or reduced mobility. Denies headaches, vertigo, seizures. Denies depression, anxiety or insomnia. Denies  rash, lesions, or itch.     Preventive Screening-Counseling &  Management  Alcohol-Tobacco     Smoking Cessation Counseling: yes  Allergies (verified): No Known Drug Allergies  Review of Systems      See HPI General:  Complains of fatigue. Eyes:  Denies discharge and red eye. Endo:  Complains of excessive thirst and excessive urination. Heme:  Denies abnormal bruising and bleeding. Allergy:  Denies hives or rash and itching eyes.  Physical Exam  General:  Well-developed,obese,in no acute distress; alert,appropriate and cooperative throughout examination HEENT: No facial asymmetry,  EOMI, No sinus tenderness, TM's Clear, oropharynx  pink and moist.   Chest: Clear to auscultation bilaterally.  CVS: S1, S2, No murmurs, No S3.   Abd: Soft, Nontender.  MS: Adequate ROM spine, hips, shoulders and knees.  Ext: No edema.   CNS: CN 2-12 intact, power tone and sensation normal throughout.   Skin: Intact, no visible lesions or rashes.  Psych: Good eye contact, normal affect.  Memory intact, not anxious or depressed appearing.    Impression & Recommendations:  Problem # 1:  ALCOHOL USE (ICD-305.00) Assessment Unchanged pt encouraged of the need to quit alcohol use , it negatively affects him, he is dependent  Problem # 2:  OBESITY (ICD-278.00)  Ht: 70 (05/06/2010)   Wt: 203 (05/06/2010)   BMI: 29.23 (05/06/2010)  Problem # 3:  NICOTINE ADDICTION (ICD-305.1) Assessment: Unchanged  Encouraged smoking cessation and discussed different methods for smoking cessation.   Problem # 4:  DIABETES MELLITUS, TYPE  II, WITHOUT COMPLICATIONS (ICD-250.00) Assessment: Comment Only  His updated medication list for this problem includes:    Avalide 300-25 Mg Tabs (Irbesartan-hydrochlorothiazide) ..... One tab by mouth every morning    Metformin Hcl 1000 Mg Tabs (Metformin hcl) .[name]... Take 1 tablet by mouth two times a day    Glipizide 5 Mg Tabs (Glipizide) .[name]... Take 1 tablet by mouth two times a day    Adult Aspirin Low Strength 81 Mg Tbdp (Aspirin) .....  One tab by mouth qd    Avapro 300 Mg Tabs (Irbesartan) .[name]... 1 daily pt advised of the need to take his meds and start testing his sugars to prevent further deterioration in blood sugar control Orders: T- Hemoglobin A1C (10272-53664) T-Urine Microalbumin w/creat. ratio [phone])  Labs Reviewed: Creat: 1.07 (12/11/2009)    Reviewed HgBA1c results: 9.5 (11/21/2009)  8.6 (05/09/2009)  Problem # 5:  HYPERLIPIDEMIA (ICD-272.4) Assessment: Comment Only  His updated medication list for this problem includes:    Simvastatin 40 Mg Tabs (Simvastatin) .[name]... Take 1 tab by mouth at bedtime  Orders: T-Hepatic Function [phone]) T-Lipid Profile [id number])  Labs Reviewed: SGOT: 20 (12/11/2009)   SGPT: 28 (12/11/2009)   HDL:37 (12/11/2009), 37 (11/17/2008)  LDL:See Comment mg/dL (60/09/9322), 87 (55/73/2202)  Chol:247 (12/11/2009), 168 (11/17/2008)  Trig:777 (12/11/2009), 222 (11/17/2008)  Problem # 6:  HYPERTENSION (ICD-401.9) Assessment: Improved  His updated medication list for this problem includes:    Avalide 300-25 Mg Tabs (Irbesartan-hydrochlorothiazide) ..... One tab by mouth every morning    Amlodipine Besylate 10 Mg Tabs (Amlodipine besylate) ..... One tab by mouth qd    Spironolactone 25 Mg Tabs (Spironolactone) ..... One tab by mouth qd    Metoprolol Succinate 50 Mg Xr24h-tab (Metoprolol succinate) ..... One tab by mouth once daily    Hydrochlorothiazide 25 Mg Tabs (Hydrochlorothiazide) .[name]... 1 daily q am    Avapro 300 Mg Tabs (Irbesartan) .[name]... 1 daily  Orders: T-Basic Metabolic Panel [phone])  BP today: 136/88 Prior BP: 140/90 (11/21/2009)  Labs Reviewed: K+: 3.7 (12/11/2009) Creat: : 1.07 (12/11/2009)   Chol: 247 (12/11/2009)   HDL: 37 (12/11/2009)   LDL: See Comment mg/dL (28/31/5176)   TG: 160 (12/11/2009)  Complete Medication List: 1)  Avalide 300-25 Mg Tabs (Irbesartan-hydrochlorothiazide) .... One tab by mouth every morning 2)  Proventil Hfa  108 (90 Base) Mcg/act Aers (Albuterol sulfate) .... 2 puffs every 6 to 8 hours as needed 3)  Metformin Hcl 1000 Mg Tabs (Metformin hcl) .... Take 1 tablet by mouth two times a day 4)  Glipizide 5 Mg Tabs (Glipizide) .... Take 1 tablet by mouth two times a day 5)  Amlodipine Besylate 10 Mg Tabs (Amlodipine besylate) .... One tab by mouth qd 6)  Spironolactone 25 Mg Tabs (Spironolactone) .... One tab by mouth qd 7)  Metoprolol Succinate 50 Mg Xr24h-tab (Metoprolol succinate) .... One tab by mouth once daily 8)  Klor-con M20 20 Meq Cr-tabs (Potassium chloride crys cr) .... One tab by mouth qd 9)  Vitamin E 200 Unit Caps (Vitamin e) .... One cap by mouth qd 10)  Fish Oil 1000 Mg Caps (Omega-3 fatty acids) .... One cap by mouth qd 11)  Garlic 500 Mg Caps (Garlic) .... One tab by mouth qd 12)  Adult Aspirin Low Strength 81 Mg Tbdp (Aspirin) .... One tab by mouth qd 13)  Simvastatin 40 Mg Tabs (Simvastatin) .... Take 1 tab by mouth at bedtime 14)  Hydrochlorothiazide 25 Mg Tabs (Hydrochlorothiazide) .[name].. 1 daily q am 15)  Avapro 300 Mg Tabs (Irbesartan) .[name].. 1 daily  Patient Instructions: 1)  Please schedule a follow-up appointment in 3 months. 2)  It is important that you exercise regularly at least 20 minutes 5 times a week. If you develop chest pain, have severe difficulty breathing, or feel very tired , stop exercising immediately and seek medical attention. 3)  You need to lose weight. Consider a lower calorie diet and regular exercise.  4)  Check your blood sugars regularly. If your readings are usually above : or below 70 you should contact our office. 5)  Tobacco is very bad for your health and your loved ones! You Should stop smoking!. 6)  Stop Smoking Tips: Choose a Quit date. Cut down before the Quit date. decide what you will do as a substitute when you feel the urge to smoke(gum,toothpick,exercise). 7)  BMP prior to visit, ICD-9: 8)  Hepatic Panel prior to visit, ICD-9: 9)  Lipid Panel  prior to visit, ICD-9:   labs today 10)  HbgA1C prior to visit, ICD-9: 11)  Urine Microalbumin prior to visit, ICD-9:

## 2011-01-13 NOTE — Letter (Signed)
Summary: 1st Missed Appt.  [facility]  [address]   [place], [place] [address]   Phone: [phone]  Fax: [phone]    July 03, 2010  MRN: [id number]  Menachem Hebert [address] [place], [place]  [address]  Dear Mr. Fennimore,  At [facility], we make every attempt to fit patients into our schedule by reserving several appointment slots for same-day appointments.  However, we cannot always make appointments for patients the same day they are calling.  At the end of the day, we look back at our schedule and find that because of last-minute cancellations and patients not showing up for their scheduled appointments, we have several appointment slots that are left open and could have been used by another person who really needed it.  In the past, you may have been one of the patients who could not get in when you needed to.  But recently, you were one of the patients with an appointment that you didn't show up for or canceled too late for us to fill it.  We choose not to charge no-show or last minute cancellation fees to our patients, like many other offices do.  We do not wish to institute that policy and hope we never have to.  However, we kindly request that you assist us by providing at least 24 hours' notice if you can't make your appointment.  If no-shows or late cancellations become habitual (i.e. Three or more in a one-year period), we may terminate the physician-patient relationship.    Thank you for your consideration and cooperation.   Luann Bullins  

## 2011-01-15 NOTE — Progress Notes (Signed)
Summary: please advise  Phone Note Call from Patient   Summary of Call: Patient called in and would like for you to call him he states his son's girlfriends mom called and said they need to get checked for something, he would like to discuss with you. Initial call taken by: Curtis Sites,  December 12, 2010 9:29 AM  Follow-up for Phone Call        sons girlfriends doctor told her that anyone she has been in contact with needs to be tested for TB  son, dad, and mom all coming for nurse visit ppd skin test 12/22/10 Follow-up by: Adella Hare LPN,  December 12, 2010 2:08 PM

## 2011-01-15 NOTE — Assessment & Plan Note (Signed)
Summary: F UP   Vital Signs:  Patient profile:   50 year old male Height:      70 inches Weight:      205 pounds BMI:     29.52 O2 Sat:      98 % Pulse rate:   83 / minute Pulse rhythm:   regular Resp:     16 per minute BP sitting:   130 / 84  (left arm) Cuff size:   large  Vitals Entered By: Everitt Amber LPN (December 29, 2010 4:10 PM)  Nutrition Counseling: Patient's BMI is greater than 25 and therefore counseled on weight management options. CC: Follow up chronic problems   CC:  Follow up chronic problems.  History of Present Illness: Reports  that he is doing fairly well. He has no expressed concern. Denies recent fever or chills. Denies sinus pressure, nasal congestion , ear pain or sore throat. Denies chest congestion, or cough productive of sputum. Denies chest pain, palpitations, PND, orthopnea or leg swelling. Denies abdominal pain, nausea, vomitting, diarrhea or constipation. Denies change in bowel movements or bloody stool. Denies dysuria , frequency, incontinence or hesitancy. Denies  joint pain, swelling, or reduced mobility. Denies headaches, vertigo, seizures. Denies depression, anxiety or insomnia. Denies  rash, lesions, or itch.     Preventive Screening-Counseling & Management  Alcohol-Tobacco     Smoking Cessation Counseling: yes  Current Medications (verified): 1)  Proventil Hfa 108 (90 Base) Mcg/act Aers (Albuterol Sulfate) .... 2 Puffs Every 6 To 8 Hours As Needed 2)  Amlodipine Besylate 10 Mg Tabs (Amlodipine Besylate) .... One Tab By Mouth Qd 3)  Metoprolol Succinate 50 Mg Xr24h-Tab (Metoprolol Succinate) .... One Tab By Mouth Once Daily 4)  Klor-Con M20 20 Meq Cr-Tabs (Potassium Chloride Crys Cr) .... One Tab By Mouth Qd 5)  Vitamin E 200 Unit Caps (Vitamin E) .... One Cap By Mouth Qd 6)  Fish Oil 1000 Mg Caps (Omega-3 Fatty Acids) .... One Cap By Mouth Qd 7)  Garlic 500 Mg Caps (Garlic) .... One Tab By Mouth Qd 8)  Adult Aspirin Low Strength  81 Mg Tbdp (Aspirin) .... One Tab By Mouth Qd 9)  Hydrochlorothiazide 25 Mg Tabs (Hydrochlorothiazide) .[name].. 1 Daily Q Am 10)  Avapro 300 Mg Tabs (Irbesartan) .[name].. 1 Daily 11)  Kombiglyze Xr 2.04-999 Mg Xr24h-Tab (Saxagliptin-Metformin) .... 2 Tablets Once Daily 12)  Glipizide 10 Mg Xr24h-Tab (Glipizide) .... Take 1 Tablet By Mouth Once A Day 13)  Spironolactone 25 Mg Tabs (Spironolactone) .... Take 1 Tablet By Mouth Once A Day  Allergies (verified): No Known Drug Allergies  Review of Systems General:  Complains of fatigue. Eyes:  Complains of blurring; denies eye irritation and halos. Endo:  Complains of excessive hunger, excessive thirst, excessive urination, and polyuria. Heme:  Denies abnormal bruising and bleeding. Allergy:  Complains of seasonal allergies; denies hives or rash and itching eyes.  Physical Exam  General:  Well-developed,well-nourished,in no acute distress; alert,appropriate and cooperative throughout examination HEENT: No facial asymmetry,  EOMI, No sinus tenderness, TM's Clear, oropharynx  pink and moist.   Chest: Clear to auscultation bilaterally.  CVS: S1, S2, No murmurs, No S3.   Abd: Soft, Nontender.  MS: Adequate ROM spine, hips, shoulders and knees.  Ext: No edema.   CNS: CN 2-12 intact, power tone and sensation normal throughout.   Skin: Intact, no visible lesions or rashes.  Psych: Good eye contact, normal affect.  Memory intact, not anxious or depressed appearing.  Impression & Recommendations:  Problem # 1:  OVERWEIGHT (ICD-278.02) Assessment Deteriorated  Orders: T-TSH ([id number])  Ht: 70 (12/29/2010)   Wt: 205 (12/29/2010)   BMI: 29.52 (12/29/2010) therapeutic lifestyle change discussed and encouraged  Problem # 2:  ALCOHOL USE (ICD-305.00) Assessment: Comment Only Pt and spouse report lessuse, hiowever I believe hat tey are both in denial to some extent. I have repeatedly recommended professional help  Problem # 3:  NICOTINE  ADDICTION (ICD-305.1) Assessment: Unchanged  Encouraged smoking cessation and discussed different methods for smoking cessation.   Problem # 4:  HYPERLIPIDEMIA (ICD-272.4) Assessment: Comment Only  The following medications were removed from the medication list:    Lipitor 40 Mg Tabs (Atorvastatin calcium) .[name]... Take 1 tab by mouth at bedtime His updated medication list for this problem includes:    Lipitor 40 Mg Tabs (Atorvastatin calcium) .[name]... Take 1 tab by mouth at bedtime  for high cholesterol Low fat dietdiscussed and encouraged  Orders: T-Hepatic Function [id number]) T-Lipid Profile [id number])  Labs Reviewed: SGOT: 19 (11/13/2010)   SGPT: 28 (11/13/2010)   HDL:37 (05/07/2010), 37 (12/11/2009)  LDL:See Comment mg/dL (57/84/6962), See Comment mg/dL (95/28/4132)  GMWN:027 (05/07/2010), 247 (12/11/2009)  Trig:651 (05/07/2010), 777 (12/11/2009)  Problem # 5:  DIABETES MELLITUS, TYPE II, WITHOUT COMPLICATIONS (ICD-250.00) Assessment: Deteriorated  The following medications were removed from the medication list:    Avalide 300-25 Mg Tabs (Irbesartan-hydrochlorothiazide) ..... One tab by mouth every morning His updated medication list for this problem includes:    Adult Aspirin Low Strength 81 Mg Tbdp (Aspirin) ..... One tab by mouth qd    Avapro 300 Mg Tabs (Irbesartan) .[name]... 1 daily    Kombiglyze Xr 2.04-999 Mg Xr24h-tab (Saxagliptin-metformin) .[name]... 2 tablets once daily    Glipizide 10 Mg Xr24h-tab (Glipizide) .[name]... Take 1 tablet by mouth once a day  Orders: T- Hemoglobin A1C ([id number]) Glucose, (CBG) (74259)  Labs Reviewed: Creat: 1.13 (11/13/2010)    Reviewed HgBA1c results: 12.0 (11/13/2010)  10.2 (08/06/2010)  Problem # 6:  HYPERTENSION (ICD-401.9) Assessment: Improved  The following medications were removed from the medication list:    Avalide 300-25 Mg Tabs (Irbesartan-hydrochlorothiazide) ..... One tab by mouth every morning His updated medication list  for this problem includes:    Amlodipine Besylate 10 Mg Tabs (Amlodipine besylate) ..... One tab by mouth qd    Metoprolol Succinate 50 Mg Xr24h-tab (Metoprolol succinate) ..... One tab by mouth once daily    Hydrochlorothiazide 25 Mg Tabs (Hydrochlorothiazide) .[name]... 1 daily q am    Avapro 300 Mg Tabs (Irbesartan) .[name]... 1 daily    Spironolactone 25 Mg Tabs (Spironolactone) .[name]... Take 1 tablet by mouth once a day  Orders: T-Basic Metabolic Panel [id number])  BP today: 130/84 Prior BP: 140/100 (11/13/2010)  Labs Reviewed: K+: 3.4 (11/13/2010) Creat: : 1.13 (11/13/2010)   Chol: 230 (05/07/2010)   HDL: 37 (05/07/2010)   LDL: See Comment mg/dL (29/51/8841)   TG: 660 (05/07/2010)  Complete Medication List: 1)  Proventil Hfa 108 (90 Base) Mcg/act Aers (Albuterol sulfate) .... 2 puffs every 6 to 8 hours as needed 2)  Amlodipine Besylate 10 Mg Tabs (Amlodipine besylate) .... One tab by mouth qd 3)  Metoprolol Succinate 50 Mg Xr24h-tab (Metoprolol succinate) .... One tab by mouth once daily 4)  Klor-con M20 20 Meq Cr-tabs (Potassium chloride crys cr) .... One tab by mouth qd 5)  Vitamin E 200 Unit Caps (Vitamin e) .... One cap by mouth qd 6)  Fish Oil 1000 Mg Caps (Omega-3 fatty  acids) .... One cap by mouth qd 7)  Garlic 500 Mg Caps (Garlic) .... One tab by mouth qd 8)  Adult Aspirin Low Strength 81 Mg Tbdp (Aspirin) .... One tab by mouth qd 9)  Hydrochlorothiazide 25 Mg Tabs (Hydrochlorothiazide) .[name].. 1 daily q am 10)  Avapro 300 Mg Tabs (Irbesartan) .[name].. 1 daily 11)  Kombiglyze Xr 2.04-999 Mg Xr24h-tab (Saxagliptin-metformin) .... 2 tablets once daily 12)  Glipizide 10 Mg Xr24h-tab (Glipizide) .... Take 1 tablet by mouth once a day 13)  Spironolactone 25 Mg Tabs (Spironolactone) .... Take 1 tablet by mouth once a day 14)  Lipitor 40 Mg Tabs (Atorvastatin calcium) .... Take 1 tab by mouth at bedtime  for high cholesterol  Other Orders: T-PSA ([id number])  Patient Instructions: 1)   Please schedule a follow-up appointment in 2 months. 2)  Tobacco is very bad for your health and your loved ones! You Should stop smoking!. 3)  Stop Smoking Tips: Choose a Quit date. Cut down before the Quit date. decide what you will do as a substitute when you feel the urge to smoke(gum,toothpick,exercise). 4)  It is important that you exercise regularly at least 20 minutes 5 times a week. If you develop chest pain, have severe difficulty breathing, or feel very tired , stop exercising immediately and seek medical attention. 5)  You need to lose weight. Consider a lower calorie diet and regular exercise.  6)  BMP prior to visit, ICD-9: 7)  HbgA1C prior to visit, ICD-9: 8)  Hepatic Panel prior to visit, ICD-9:  fasting in March 9)  Lipid Panel prior to visit, ICD-9: 10)  TSH prior to visit, ICD-9: 11)  PSA prior to visit, ICD-9: 12)  You absolutely nEED to cut back/stop the cake, cookies, candy Prescriptions: KLOR-CON M20 20 MEQ CR-TABS (POTASSIUM CHLORIDE CRYS CR) one tab by mouth qd  #30 Tablet x 3   Entered by:   Adella Hare LPN   Authorized by:   Syliva Overman MD   Signed by:   Adella Hare LPN on 87/56/4332   Method used:   Electronically to        [facility] (retail)       [address]       [place], [place]  [address]       Ph: [phone]       Fax: [phone]   RxID:   [id number] METOPROLOL SUCCINATE 50 MG XR24H-TAB (METOPROLOL SUCCINATE) one tab by mouth once daily  #30 Tablet x 3   Entered by:   Adella Hare LPN   Authorized by:   Syliva Overman MD   Signed by:   Adella Hare LPN on 83/15/1761   Method used:   Electronically to        [facility] (retail)       [address]       [place], [place]  [address]       Ph: [phone]       Fax: [phone]   RxID:   [id number] AMLODIPINE BESYLATE 10 MG TABS (AMLODIPINE BESYLATE) one tab by mouth qd  #30 Tablet x 3   Entered by:   Adella Hare LPN   Authorized by:   Syliva Overman MD   Signed by:   Adella Hare LPN on 81/12/7508   Method [facility]:   [facility]       [address]       [place], [place]  [address]       Ph: [phone]       Fax: [phone]   RxID:   [id number] LIPITOR 40 MG TABS (ATORVASTATIN CALCIUM) Take 1 tab by mouth at bedtime  for high cholesterol  #30 x 4   Entered and Authorized by:   Syliva Overman MD   Signed by:   Syliva Overman MD on 12/29/2010   Method used:   Electronically to        [facility] (retail)       [address]       [place], [place]  [address]       Ph: [phone]       Fax: [phone]   RxID:   [id number]    Orders Added: 1)  Est. Patient Level IV [06301] 2)  T-Basic Metabolic Panel [60109-32355] 3)  T-Hepatic Function [80076-22960] 4)  T-Lipid Profile [80061-22930] 5)  T- Hemoglobin A1C [83036-23375] 6)  T-PSA [73220-25427] 7)  T-TSH [06237-62831] 8)  Glucose, (CBG) [82962]    Laboratory Results   Blood Tests   Date/Time Received: December 29, 2010 4:54 PM  Date/Time Reported: December 29, 2010 4:54 PM   Glucose (random): 187 mg/dL   (Normal Range: 51-761)

## 2011-01-15 NOTE — Assessment & Plan Note (Signed)
Summary: ppd test/per Jamie--slj  Nurse Visit   Allergies: No Known Drug Allergies  Immunizations Administered:  PPD Skin Test:    Vaccine Type: PPD    Site: left forearm    Mfr: Sanofi Pasteur    Dose: 0.1 ml    Route: ID    Given by: Adella Hare LPN    Exp. Date: 10/16/2011    Lot #: [id number]  Orders Added: 1)  TB Skin Test [86580] 2)  Admin 1st Vaccine [90471]  pt to return in 48 to 72 hours to have test read

## 2011-03-09 ENCOUNTER — Encounter: Payer: Self-pay | Admitting: Family Medicine

## 2011-03-10 ENCOUNTER — Encounter: Payer: Self-pay | Admitting: Family Medicine

## 2011-03-11 ENCOUNTER — Encounter: Payer: Self-pay | Admitting: Family Medicine

## 2011-03-11 ENCOUNTER — Ambulatory Visit (INDEPENDENT_AMBULATORY_CARE_PROVIDER_SITE_OTHER): Payer: PRIVATE HEALTH INSURANCE | Admitting: Family Medicine

## 2011-03-11 VITALS — BP 120/88 | HR 88 | Resp 16 | Ht 69.5 in | Wt 204.0 lb

## 2011-03-11 DIAGNOSIS — F17200 Nicotine dependence, unspecified, uncomplicated: Secondary | ICD-10-CM

## 2011-03-11 DIAGNOSIS — I10 Essential (primary) hypertension: Secondary | ICD-10-CM

## 2011-03-11 DIAGNOSIS — E119 Type 2 diabetes mellitus without complications: Secondary | ICD-10-CM

## 2011-03-11 DIAGNOSIS — E785 Hyperlipidemia, unspecified: Secondary | ICD-10-CM

## 2011-03-11 MED ORDER — SPIRONOLACTONE 25 MG PO TABS: 25.0000 mg | ORAL_TABLET | Freq: Every day | ORAL | Status: DC

## 2011-03-11 NOTE — Progress Notes (Signed)
Did not being meds to OV

## 2011-03-11 NOTE — Patient Instructions (Addendum)
F/u in 3 months.  You need to quit nicotine and alcohol use.  pls start taking the lipitor for your cholesterol, also the spironolactone for your blood pressure and heart.  Today labs are for hBA1C, chem 7.  lipid , hepatic , chem , hBA1c  Fasting in 3 months

## 2011-03-12 LAB — BASIC METABOLIC PANEL
CO2: 22 mEq/L (ref 19–32)
Calcium: 9.7 mg/dL (ref 8.4–10.5)
Sodium: 142 mEq/L (ref 135–145)

## 2011-03-12 MED ORDER — GLIPIZIDE ER 5 MG PO TB24: 5.0000 mg | ORAL_TABLET | Freq: Every day | ORAL | Status: DC

## 2011-03-15 ENCOUNTER — Encounter: Payer: Self-pay | Admitting: Family Medicine

## 2011-03-15 NOTE — Progress Notes (Signed)
Subjective:    Patient ID: Antonio Cobb, male    DOB: 04/30/1961, 50 y.o.   MRN: [id number] The PT is here for follow up and re-evaluation of chronic medical conditions, medication management and review of recent lab and radiology data.  Preventive health is updated, specifically  Cancer screening, Osteoporosis screening and Immunization.   Questions or concerns regarding consultations or procedures which the PT has had in the interim are  addressed. The PT denies any adverse reactions to current medications since the last visit.  There are no new concerns.  There are no specific complaints     Hypertension This is a chronic problem. The current episode started more than 1 year ago. The problem has been gradually improving since onset. The problem is uncontrolled. Associated symptoms include chest pain ( intermittent ) and shortness of breath. Pertinent negatives include no headaches, neck pain, orthopnea, palpitations, peripheral edema or PND. There are no associated agents to hypertension. Risk factors for coronary artery disease include diabetes mellitus, dyslipidemia, male gender, obesity, smoking/tobacco exposure and stress. Past treatments include angiotensin blockers, calcium channel blockers, diuretics and beta blockers. The current treatment provides moderate improvement. Compliance problems: non compiant ,states unsure what he is on.   Diabetes He presents for his follow-up diabetic visit. He has type 2 diabetes mellitus. No MedicAlert identification noted. His disease course has been worsening. Pertinent negatives for hypoglycemia include no confusion, headaches, nervousness/anxiousness or seizures. Associated symptoms include chest pain ( intermittent ). Symptoms are improving. Current diabetic treatment includes oral agent (triple therapy). He is compliant with treatment some of the time. His weight is stable. He has not had a previous visit with a dietician. An ACE  inhibitor/angiotensin II receptor blocker is being taken. He does not see a podiatrist. Hyperlipidemia Associated symptoms include chest pain ( intermittent ) and shortness of breath.  Nicotine Dependence His urge triggers include company of smokers.  Alcohol Problem Pertinent negatives include no agitation, confusion or seizures. Pertinent negatives include no fever, nausea or vomiting.      Review of Systems  Constitutional: Negative for fever, chills and activity change.  HENT: Positive for postnasal drip. Negative for hearing loss, congestion, facial swelling, sneezing, neck pain, neck stiffness and sinus pressure.   Eyes: Negative for pain and redness.  Respiratory: Positive for cough ( chronic smoker's cough) and shortness of breath. Negative for choking and chest tightness.   Cardiovascular: Positive for chest pain ( intermittent ). Negative for palpitations, orthopnea, leg swelling and PND.  Gastrointestinal: Negative for nausea, vomiting, diarrhea, constipation and abdominal distention.  Genitourinary: Negative for dysuria.  Skin: Negative for rash and wound.  Neurological: Negative for seizures, numbness and headaches.  Hematological: Negative for adenopathy. Does not bruise/bleed easily.  Psychiatric/Behavioral: Positive for sleep disturbance ( reports nightmares, wonders  if due to statin). Negative for behavioral problems, confusion and agitation. The patient is not nervous/anxious.        Objective:   Physical Exam  [nursing notereviewed. Patient alert and oriented and in no Cardiopulmonary distress.  HEENT: No facial asymmetry, EOMI, no sinus tenderness, TM's clear, Oropharynx pink and moist.  Neck supple no adenopathy.  Chest: Clear to auscultation bilaterally.  CVS: S1, S2 no murmurs, no S3.  ABD: Soft non tender. Bowel sounds normal.  Ext: No edema  MS: Adequate ROM spine, shoulders, hips and knees.  Skin: Intact, no ulcerations or rash noted.  Psych:  Good eye contact, normal affect. Memory intact not anxious or depressed appearing.  CNS: CN 2-12 intact, power, tone and sensation normal throughout.         Assessment & Plan:  1.Hypertension: improved,no change in meds. 2.Type 2 DM , blood glucose checked in office, and labs drawn today, appears to be improved. 3.Hyperlipidemia: unchanged, compliance issues adderssed, Hyperlipidemia:Low fat diet discussed and encouraged. 4.Nicotine : unchanged, cessation counselling done. 5.Alcohol dependence: improved buy history, encouraged abstinance

## 2011-03-16 ENCOUNTER — Other Ambulatory Visit: Payer: Self-pay | Admitting: Family Medicine

## 2011-04-28 NOTE — Procedures (Signed)
NAME:  Antonio Cobb, Antonio Cobb            ACCOUNT NO.:  [id number]   MEDICAL RECORD NO.:  [id number]         PATIENT TYPE:  POUT   LOCATION:  SLEE                          FACILITY:  [facility]   PHYSICIAN:  Kofi A. Gerilyn Pilgrim, M.D. DATE OF BIRTH:  1961-05-05   DATE OF PROCEDURE:  03/05/2008  DATE OF DISCHARGE:                             SLEEP DISORDER REPORT   POLYSOMNOGRAPHY REPORT   REFERRING PHYSICIAN:  Dietrich Pates.   RECORDING DATE:  03/05/2008.   INDICATIONS:  A 50 year old man who presents with snoring hence is being  evaluated for possible obstructive sleep apnea syndrome, BMI of 31,  Epworth sleepiness scale 7.   MEDICATION:  1. Metformin.  2. Potassium.  3. Metoprolol.  4. Glipizide.  5. Avapro.  6. Amlodipine.  7. Aspirin.  8. Tekturna.   SLEEP STAGE SUMMARY:  The total recording time was 402 minutes.  The  sleep latency is 7 minutes.  The REM latency is 70 minutes.  Sleep  efficiency 88%.  Stage N1 of 9% and 2 of 64% and 3 of 0%.  REM sleep  22%.   RESPIRATORY SUMMARY:  Baseline oxygen saturation 98%.  Lowest saturation  86% during REM sleep, non-REM lowest saturation 89%.  The AHI index is  13.5 with most of the events seen during REM sleep.  The REM AHI is  34.5.   LEG MOVEMENT SUMMARY:  No nocturna leg movements observed.   ELECTROCARDIOGRAM STUDY:  Average heart rate is 69, rare PVCs are seen.   IMPRESSION:  Mild obstructive sleep apnea syndrome.   RECOMMENDATIONS:  Consider formal titration study.   Thanks for this referral.      Kofi A. Gerilyn Pilgrim, M.D.  Electronically Signed     KAD/MEDQ  D:  03/06/2008  T:  03/07/2008  Job:  [id number]

## 2011-04-28 NOTE — Cardiovascular Report (Signed)
[name], [name] NO.:  [id number]   MEDICAL RECORD NO.:  [id number]          PATIENT TYPE:  OIB   LOCATION:  1961                         FACILITY:  [facility]   PHYSICIAN:  Rollene Rotunda, MD, FACCDATE OF BIRTH:  1961/06/14   DATE OF PROCEDURE:  DATE OF DISCHARGE:                            CARDIAC CATHETERIZATION   PRIMARY CARE PHYSICIAN:  Milus Mallick. Lodema Hong, M.D.   CARDIOLOGIST:  Pricilla Riffle, MD.   REASON FOR PRESENTATION:  Evaluate patient with cardiomyopathy and  abnormal Cardiolite suggesting possible inferior wall ischemia.  The  patient has an ejection fraction around 40%.  He has very difficult to  control hypertension.   PROCEDURE NOTE:  Left heart catheterization was performed via the right  femoral artery.  The area was cannulated using anterior wall puncture  with number a 4-French arterial sheath was inserted via the modified  Seldinger technique.  Preformed Judkins and a pigtail catheter were  utilized.  Of note, Judkins left I was used to cannulate the right  coronary which had a high anterior takeoff.  A distal aortogram was  obtained as well.  The patient tolerated the procedure well and left the  lab in stable condition.   RESULTS:  Hemodynamics:  LV 176/37, AO 170/110.   Coronary arteries:  The left main had luminal irregularities.  There was  distal 25% stenosis.  The LAD had ostial calcified plaque.  There was  diffuse 25-30% plaque.  There was mid 40% stenosis.  First diagonal was  small and normal.  Second diagonal was small with ostial 25% stenosis.  Circumflex in the AV groove had ostial 25% stenosis.  There were diffuse  25-30% lesions.  The ramus intermediate was large with luminal  irregularities.  First obtuse marginal was small and normal.  Second  obtuse marginal was moderate with diffuse 25-30% stenosis.  The right  coronary artery was dominant.  It had long mid 50% stenosis.  There were  diffuse luminal irregularities.   The PDA was small-moderate size with  diffuse nonobstructive disease.  Posterolaterals appeared to be very  small and diffusely diseased.   Left ventriculogram:  The left ventriculogram was obtained in the RAO  projection.  The EF was about 45% with global hypokinesis and inferior  akinesis.   Aortogram:  The distal aortogram was obtained secondary to the difficult  to control hypertension, specifically looking at the renal arteries.  These were somewhat underfilled, but could be determined to be widely  patent.   CONCLUSION:  Diffuse nonobstructive plaque.  Calcification and plaque  are significant for this gentleman's age.  Mild-moderate LV dysfunction  felt to be nonischemic though with regional wall motion abnormalities.  No obstructive renal artery stenosis to explain his hypertension.   PLAN:  The patient will need aggressive risk reduction.  I am going to  add spironolactone.  He was hypokalemic.  We will be watching his  potassium carefully once he starts this drug.      Rollene Rotunda, MD, [facility]  Electronically Signed     JH/MEDQ  D:  03/07/2008  T:  03/07/2008  Job:  [id number]   cc:   Milus Mallick. Lodema Hong, M.D.  Pricilla Riffle, MD, [facility]

## 2011-04-28 NOTE — Assessment & Plan Note (Signed)
[facility]                       [place] [facility] OFFICE NOTE   NAME:Antonio Cobb                   MRN:          [id number]  DATE:03/05/2008                            DOB:          10/29/1961    IDENTIFICATION:  Antonio Cobb is a 50 year old gentleman who I saw back  in February for an abnormal EKG and chest pain. Refer to this for full  details.   With the patient's risk factors for coronary disease,  I went ahead and  scheduled him for an echocardiogram and a Myoview study.   He had these done on February 27.  He exercised for a total of 10.4  METs.  He stopped because of dyspnea and blood pressure elevation.  Peak  blood pressure was of 260/120.  Consistent with hypertensive response.  He was treated for this. Myoview scan showed LV EF of 32% with what  appeared to be in inferior infarct, slight anterior wall ischemia, but  difficult to question if quantitatively significant.   The patient also had an echocardiogram that again showed the LV function  to be mildly decreased at 45% with severe hypokinesis of the posterior  wall apical hypokinesis.  Given these findings, I recommend the patient  have cardiac catheterization.  He comes in today to discuss this.   CURRENT MEDICINES:  1. Include metformin 1 gram b.i.d.  2. Avalide 300/25 daily.  3. Toprol XL 50.  4. Glipizide 5.  5. Potassium 20.  6. Simvastatin 20.  7. Amlodipine 5.  8. Aspirin 81 mg daily.   ALLERGIES:  None.   PAST MEDICAL HISTORY:  1. Diabetes.  2. Hypertension.  3. Dyslipidemia.   SOCIAL HISTORY:  The patient continues to smoke.  He is down to four  cigarettes per day.  Does not drink.   FAMILY HISTORY:  Mother died of heart attack at age 31.  Father died of  emphysema.  One brother and one sister with no known heart problems.   REVIEW OF SYSTEMS:  All systems reviewed; negative to the same problem  except as noted above.   PHYSICAL EXAM:  The  patient currently in no distress.  Blood pressure is  150/100 today, pulse is 70.  Weight is 216.  LUNGS:  His lungs are clear.  HEENT:  Normocephalic, atraumatic, EOMI, PERRL.  Mucous membranes are  moist.  CARDIAC:  Cardiac exam regular rate and rhythm, S1-S2, no S3; no  significant murmurs.  ABDOMEN: Abdomen is supple, no masses.  No hepatomegaly.  Normal bowel  sounds.  EXTREMITIES:  Good distal pulses.  No lower extremity edema.   A 12-lead EKG done last visit shows sinus rhythm 78 beats per minute.  T-  wave inversion in the inferior and lateral leads.   IMPRESSION:  Antonio Cobb is a 50 year old gentleman with diabetes,  dyslipidemia and significant hypertension.  He has been seen in the past  and actually had a Myoview study done in February 2007 which showed  normal perfusion.  Now with a Myoview scan with defects as noted and  echocardiogram also with regional wall motion abnormalities. Based on  all the  above, I would recommend left heart catheterization to find his  anatomy. Discussed the risks and benefit and the patient is agreeing to  proceed.  He understands.   I would increase his amlodipine to 10 mg daily, increase his Toprol to  75 mg daily.  (1.5 half tabs daily).  We will plan for this on Wednesday  and will get pre labs today.  Continue other meds for now, but hold  metformin and glipizide the day of the procedure.     Antonio Riffle, MD, [facility]  Electronically Signed    PVR/MedQ  DD: 03/05/2008  DT: 03/05/2008  Job #: [id number]   cc:   Antonio Cobb. Antonio Cobb, M.D.

## 2011-04-28 NOTE — Procedures (Signed)
NAME:  Antonio Cobb, Antonio Cobb            ACCOUNT NO.:  [id number]   MEDICAL RECORD NO.:  [id number]         PATIENT TYPE:  POUT   LOCATION:                                FACILITY:  [facility]   PHYSICIAN:  Kofi A. Gerilyn Pilgrim, M.D. DATE OF BIRTH:  Jan 02, 1961   DATE OF PROCEDURE:  03/05/2008  DATE OF DISCHARGE:                             SLEEP DISORDER REPORT   POLYSOMNOGRAPHY REPORT   REFERRING PHYSICIAN:  Dietrich Pates.   RECORDING DATE:  03/05/2008.   INDICATIONS:  A 50 year old man who presents with snoring hence is being  evaluated for possible obstructive sleep apnea syndrome, BMI of 31,  Epworth sleepiness scale 7.   MEDICATION:  1. Metformin.  2. Potassium.  3. Metoprolol.  4. Glipizide.  5. Avapro.  6. Amlodipine.  7. Aspirin.  8. Tekturna.   SLEEP STAGE SUMMARY:  The total recording time was 402 minutes.  The  sleep latency is 7 minutes.  The REM latency is 70 minutes.  Sleep  efficiency 88%.  Stage N1 of 9% and 2 of 64% and 3 of 0%.  REM sleep  22%.   RESPIRATORY SUMMARY:  Baseline oxygen saturation 98%.  Lowest saturation  86% during REM sleep, non-REM lowest saturation 89%.  The AHI index is  13.5 with most of the events seen during REM sleep.  The REM AHI is  34.5.   LEG MOVEMENT SUMMARY:  No nocturna leg movements observed.   ELECTROCARDIOGRAM STUDY:  Average heart rate is 69, rare PVCs are seen.   IMPRESSION:  Mild obstructive sleep apnea syndrome.   RECOMMENDATIONS:  Consider formal titration study.   Thanks for this referral.      Kofi A. Gerilyn Pilgrim, M.D.     KAD/MEDQ  D:  03/06/2008  T:  03/07/2008  Job:  [id number]

## 2011-04-28 NOTE — Assessment & Plan Note (Signed)
[facility]                       [place] [facility] OFFICE NOTE   NAME:Antonio Cobb, Antonio Cobb                   MRN:          [id number]  DATE:03/22/2008                            DOB:          10/18/1961    IDENTIFICATION:  Mr. Clubb is a 50 year old gentleman who I saw back  in March, refer to this for full details.   Because of the patient's abnormal test findings, I set him up for  cardiac catheterization.  He had this done on March 25 by Angelina Sheriff.  This showed a 25% distal left main, the LAD had ostial calcification.  There were 25-30% diffuse plaque noted, 40% mid stenosis, D1 was small,  D2 small with a 25% stenosis. The circumflex artery had a diffuse 25-30%  narrowing along the vessel, second OM 25-30%, RCA was dominant and had a  long 50% stenosis.  PLSA appeared small and diffusely diseased. LVEF was  45%.   In addition since seen, the patient has had a sleep study done by Dr.  Gerilyn Pilgrim and this showed mild obstructive sleep apnea.   Since seen the patient denies chest pain, no shortness of breath.   CURRENT MEDICINES:  1. Metformin 1 gram b.i.d.  2. Avalide 300/25 daily.  3. Potassium 20 mEq daily.  4. Aspirin 81.  5. Aldactone 25.  6. Metoprolol 75 daily.  7. Crestor 10.  8. Amlodipine 10.  9. Glipizide 5 b.i.d.   PHYSICAL EXAM:  The patient is in no distress.  He does have a cold  today, has got some nasal congestion and is coughing.  Blood pressure is 136/90, pulse is 64 and regular.  Weight 211, down 5  pounds from previous.  LUNGS:  Relatively clear.  CARDIAC:  Regular rate and rhythm, S1 and S2 no murmurs.  ABDOMEN:  Benign.  EXTREMITIES:  No lower extremity edema.   IMPRESSION:  1. Coronary artery disease, mild but diffuse with definite plaquing      and calcification.  Recommend aggressive risk factor modification.  2. Left ventricular dysfunction, mild. He is asymptomatic.  He is on      an ACE beta  blocker, aldactone, aspirin. Blood pressure is still      up, I have increased the metoprolol.  I would aggressively manage      risk factors for coronary artery disease.  3. Dyslipidemia.  Will get a fasting LipoMed.  4. Diabetes. Again needs aggressive control.   I will set to see the patient back later this summer.  I will refer the  patient to  Dr. Juanetta Gosling with a positive sleep study.     Pricilla Riffle, MD, [facility]  Electronically Signed    PVR/MedQ  DD: 03/22/2008  DT: 03/23/2008  Job #: [id number]   cc:   Milus Mallick. Lodema Hong, M.D.  Edward L. Juanetta Gosling, M.D.

## 2011-04-28 NOTE — Assessment & Plan Note (Signed)
[facility]                       [place] [facility] OFFICE NOTE   NAME:Haisley, TRASHAWN OQUENDO                   MRN:          [id number]  DATE:02/09/2008                            DOB:          02-29-1961    IDENTIFICATION:  Mr. Gibbs is a 50 year old gentleman who is referred  by Syliva Overman for evaluation of abnormal EKG and chest pain.   HISTORY OF PRESENT ILLNESS:  The patient has no known history of  coronary artery disease.  Note, he was last seen actually by Dionicio Stall  back in February 2007 for similar symptoms.  He underwent Myoview at  that time exercising 8 minutes 22 seconds, developed a hypertensive  response, the test was stopped before a region maximal heart rate.  Myoview scan showed a prominent right ventricle but normal perfusion.   The patient comes in today.  Says he has had occasional episodes of  substernal pain.  He does not associate them with any particular  activity.  A few weeks ago he had an episode that occurred below his  sternum, radiated to the left side of his chest.  He questioned if it  was a pulled muscle though, he says it did not change when he moved his  upper extremities.  It went away on its own.  He has not had any since.   He takes activities as tolerated.  He said people at work said he gets a  little more short of breath, although he does not notice.   CURRENT MEDICATIONS:  1. Metformin 1 gram b.i.d.  2. Avalide 300/25 daily.  3. Toprol XL 50.  4. Glipizide 5 b.i.d.  5. Potassium 20 mEq daily.   PAST MEDICAL HISTORY:  1. Hypertension.  The patient is erratic in taking medicines and does      not have scheduled time during the day he takes them.  2. Diabetes on oral agents.  3. Tobacco use.   SOCIAL HISTORY:  The patient is married.  He has two children.  He is a  Curator.  He continues to smoke about a pack per day, one time nearly  quit, was down to one cigarette per day.  Used to  drink heavily, but not  now.   REVIEW OF SYSTEMS:  All systems reviewed, negative to the above problem  except as noted above.  Wife reports the patient does snore a lot.   PHYSICAL EXAMINATION:  GENERAL:  The patient is in no distress.  VITAL SIGNS:  Blood pressure is 142/110, pulse is 68, weight 212 (the  patient did not take medicines before coming here).  HEENT:  Normocephalic, atraumatic, EOMI, PERRL.  Mucous membranes moist.  NECK:  JVP is normal.  No thyromegaly or bruits.  LUNGS:  Clear to auscultation without rales or wheezes.  CARDIAC:  Regular rate and rhythm, S1-S2, no S3-S4 or murmurs.  ABDOMEN:  Benign.  Normal bowel sounds.  No hepatomegaly.  EXTREMITIES:  Good distal pulses.  No lower extremity edema.   STUDIES:  A 12-lead EKG, normal sinus rhythm, 78 beats per minute.  T-  wave inversion in leads 2, 3,  F, V4-V6.   IMPRESSION:  1. The patient is a 50 year old with multiple risk factors for      coronary artery disease.  Symptoms are somewhat atypical, but he is      a difficult historian.  His EKG is unchanged from the one a couple      years ago, but again, it is also difficult to interpret potential      changes through.  What I have recommended, is to go ahead and do      another stress test and rule out inducible ischemia.  We will also      set up for an echocardiogram given that his RV was noted to be      prominent on the previous Myoview, I question in deed if he has      sleep apnea, I think this probably should be tested for, especially      with his difficult hypertension.  I will be in touch with him with      test results too.  2. Hypertension.  The patient needs to get a pill sorter to take his      medicines at the same time in the morning every day.  I have told      him this and he says he has had some.  I have discussed at length      the long-term effects of blood pressure not being treated      adequately.  This will need to be followed before I  change any      medicines for now.  Again, a sleep study would be helpful as indeed      this may be contributing if he has sleep apnea.   HEALTH CARE MAINTENANCE:  Counseled on tobacco.  Looking at his labs, I  do not have this year's labs, and he is a patient though with so many  risks, that I would consider aggressively treating to an LDL of around  70 to prevent artery disease from developing.   I will be in touch with the patient regarding the results.  I have not  set a definite follow up.     Pricilla Riffle, MD, [facility]  Electronically Signed    PVR/MedQ  DD: 02/09/2008  DT: 02/10/2008  Job #: [id number]   cc:   Milus Mallick. Lodema Hong, M.D.

## 2011-05-01 NOTE — Procedures (Signed)
NAMENICO, SYME NO.:  [id number]   MEDICAL RECORD NO.:  [id number]         PATIENT TYPE:  REC   LOCATION:                                FACILITY:  [facility]   PHYSICIAN:  Vida Roller, M.D.   DATE OF BIRTH:  04/09/1961   DATE OF PROCEDURE:  01/19/2006  DATE OF DISCHARGE:                                    STRESS TEST   HISTORY:  Mr. Vandam is a 50 year old male with no known coronary  disease/atypical chest discomfort. Cardiac risk factors include diabetes,  hypertension, hyperlipidemia, tobacco abuse and family history.   BASELINE DATA:  Electrocardiogram sinus rhythm at 62 beats per minute,  nonspecific ST abnormalities, blood pressure is 152/98.   The patient exercised for a total of 8 minutes and 22 seconds Bruce protocol  stage III and 10.1 METS. Maximal heart rate achieved was 140 beats per  minute which is 80% of predicted maximum. Maximal blood pressure is 222/110,  this resolved down to 148/98 in recovery. Exercise was stopped secondary to  hypertension. The patient denied any chest discomfort or shortness of  breath.   Final images and results are pending MD review.      Jae Dire, P.A. [facility]      Vida Roller, M.D.  Electronically Signed    AB/MEDQ  D:  01/19/2006  T:  01/19/2006  Job:  [id number]

## 2011-06-18 ENCOUNTER — Encounter: Payer: Self-pay | Admitting: Family Medicine

## 2011-06-19 ENCOUNTER — Other Ambulatory Visit: Payer: Self-pay | Admitting: Family Medicine

## 2011-06-20 LAB — HEPATIC FUNCTION PANEL
ALT: 26 U/L (ref 0–53)
Albumin: 4.3 g/dL (ref 3.5–5.2)
Bilirubin, Direct: 0.1 mg/dL (ref 0.0–0.3)
Total Protein: 7.1 g/dL (ref 6.0–8.3)

## 2011-06-20 LAB — HEMOGLOBIN A1C
Hgb A1c MFr Bld: 8.7 % — ABNORMAL HIGH (ref <5.7)
Mean Plasma Glucose: 203 mg/dL — ABNORMAL HIGH (ref <117)

## 2011-06-20 LAB — LIPID PANEL
Cholesterol: 195 mg/dL (ref 0–200)
Total CHOL/HDL Ratio: 5.7 Ratio
Triglycerides: 471 mg/dL — ABNORMAL HIGH (ref <150)

## 2011-06-20 LAB — BASIC METABOLIC PANEL
Glucose, Bld: 157 mg/dL — ABNORMAL HIGH (ref 70–99)
Potassium: 3.6 mEq/L (ref 3.5–5.3)
Sodium: 139 mEq/L (ref 135–145)

## 2011-06-29 ENCOUNTER — Ambulatory Visit: Payer: PRIVATE HEALTH INSURANCE | Admitting: Family Medicine

## 2011-06-29 ENCOUNTER — Encounter: Payer: Self-pay | Admitting: Family Medicine

## 2011-06-30 ENCOUNTER — Encounter: Payer: Self-pay | Admitting: Family Medicine

## 2011-06-30 ENCOUNTER — Encounter: Payer: Self-pay | Admitting: *Deleted

## 2011-07-01 ENCOUNTER — Encounter: Payer: Self-pay | Admitting: Family Medicine

## 2011-08-12 ENCOUNTER — Encounter: Payer: Self-pay | Admitting: Family Medicine

## 2011-08-13 ENCOUNTER — Encounter: Payer: Self-pay | Admitting: Family Medicine

## 2011-08-13 ENCOUNTER — Ambulatory Visit (INDEPENDENT_AMBULATORY_CARE_PROVIDER_SITE_OTHER): Payer: PRIVATE HEALTH INSURANCE | Admitting: Family Medicine

## 2011-08-13 DIAGNOSIS — Z125 Encounter for screening for malignant neoplasm of prostate: Secondary | ICD-10-CM

## 2011-08-13 DIAGNOSIS — Z1211 Encounter for screening for malignant neoplasm of colon: Secondary | ICD-10-CM

## 2011-08-13 DIAGNOSIS — F17200 Nicotine dependence, unspecified, uncomplicated: Secondary | ICD-10-CM

## 2011-08-13 DIAGNOSIS — I10 Essential (primary) hypertension: Secondary | ICD-10-CM

## 2011-08-13 DIAGNOSIS — R5381 Other malaise: Secondary | ICD-10-CM

## 2011-08-13 DIAGNOSIS — E785 Hyperlipidemia, unspecified: Secondary | ICD-10-CM

## 2011-08-13 DIAGNOSIS — Z23 Encounter for immunization: Secondary | ICD-10-CM

## 2011-08-13 DIAGNOSIS — E119 Type 2 diabetes mellitus without complications: Secondary | ICD-10-CM

## 2011-08-13 DIAGNOSIS — F1010 Alcohol abuse, uncomplicated: Secondary | ICD-10-CM

## 2011-08-13 DIAGNOSIS — R5383 Other fatigue: Secondary | ICD-10-CM

## 2011-08-13 NOTE — Patient Instructions (Addendum)
CPE in 3 months.  New additional medication for cholesterol, crestor, also you need to stop cheese and red meat   Blood sugar is HIGH, PLEASE register and go to class, also stop ice cream. PLS take TWO KOMBIGLYZE every day as prescribed  Test once daily 7 days per week.  Call if numbers are high  LABWORK  NEEDS TO BE DONE BETWEEN 3 TO 7 DAYS BEFORE YOUR NEXT SCEDULED  VISIT.  THIS WILL IMPROVE THE QUALITY OF YOUR CARE.   You are referred for colonoscopy and eye exam  Pneumonia vaccine today.   Please think about quitting smoking.  This is very important for your health.  Consider setting a quit date, then cutting back or switching brands to prepare to stop.  Also think of the money you will save every day by not smoking.  Quick Tips to Quit Smoking: Fix a date i.e. keep a date in mind from when you would not touch a tobacco product to smoke  Keep yourself busy and block your mind with work loads or reading books or watching movies in malls where smoking is not allowed  Vanish off the things which reminds you about smoking for example match box, or your favorite lighter, or the pipe you used for smoking, or your favorite jeans and shirt with which you used to enjoy smoking, or the club where you used to do smoking  Try to avoid certain people places and incidences where and with whom smoking is a common factor to add on  Praise yourself with some token gifts from the money you saved by stopping smoking  Anti Smoking teams are there to help you. Join their programs  Anti-smoking Gums are there in many medical shops. Try them to quit smoking   Side-effects of Smoking: Disease caused by smoking cigarettes are emphysema, bronchitis, heart failures  Premature death  Cancer is the major side effect of smoking  Heart attacks and strokes are the quick effects of smoking causing sudden death  Some smokers lives end up with limbs amputated  Breathing problem or fast breathing is another  side effect of smoking  Due to more intakes of smokes, carbon mono-oxide goes into your brain and other muscles of the body which leads to swelling of the veins and blockage to the air passage to lungs  Carbon monoxide blocks blood vessels which leads to blockage in the flow of blood to different major body organs like heart lungs and thus leads to attacks and deaths  During pregnancy smoking is very harmful and leads to premature birth of the infant, spontaneous abortions, low weight of the infant during birth  Fat depositions to narrow and blocked blood vessels causing heart attacks  In many cases cigarette smoking caused infertility in men

## 2011-08-15 LAB — MICROALBUMIN / CREATININE URINE RATIO
Creatinine, Urine: 139.1 mg/dL
Microalb Creat Ratio: 4.7 mg/g (ref 0.0–30.0)

## 2011-08-23 NOTE — Assessment & Plan Note (Signed)
Unchanged, counseled to quit 

## 2011-08-23 NOTE — Assessment & Plan Note (Signed)
Uncontrolled, pt counseled re the need to follow diabetic diet, states he will attend group session on this

## 2011-08-23 NOTE — Assessment & Plan Note (Signed)
Reports less dependent, counseled re the need to quit

## 2011-08-23 NOTE — Assessment & Plan Note (Signed)
Uncontrolled, dietary non compliance remains a major problem

## 2011-08-23 NOTE — Progress Notes (Signed)
  Subjective:    Patient ID: Antonio Cobb, male    DOB: Mar 03, 1961, 50 y.o.   MRN: [id number]  HPI The PT is here for follow up and re-evaluation of chronic medical conditions, medication management and review of any available recent lab and radiology data.  Preventive health is updated, specifically  Cancer screening and Immunization.   Questions or concerns regarding consultations or procedures which the PT has had in the interim are  addressed. The PT denies any adverse reactions to current medications since the last visit.  Pt reports improved blood sugar control, denies polyuria, polydipsia, b;lurred vision or hypoglycemic episodes. He has been testing, taking med, drinking less alcohol, and reports fasting sugars averaging around 140 to 150      Review of Systems See HPI Denies recent fever or chills. Denies sinus pressure, nasal congestion, ear pain or sore throat. Denies chest congestion, productive cough or wheezing. Denies chest pains, palpitations and leg swelling Denies abdominal pain, nausea, vomiting,diarrhea or constipation.   Denies dysuria, frequency, hesitancy or incontinence. Denies joint pain, swelling and limitation in mobility. Denies headaches, seizures, numbness, or tingling. Denies depression, anxiety or insomnia. Denies skin break down or rash.        Objective:   Physical Exam Patient alert and oriented and in no cardiopulmonary distress.  HEENT: No facial asymmetry, EOMI, no sinus tenderness,  oropharynx pink and moist.  Neck supple no adenopathy.  Chest: Clear to auscultation bilaterally.Decreased air entry bilaterally  CVS: S1, S2 no murmurs, no S3.  ABD: Soft non tender. Bowel sounds normal.  Ext: No edema  MS: Adequate ROM spine, shoulders, hips and knees.  Skin: Intact, no ulcerations or rash noted.  Psych: Good eye contact, normal affect. Memory intact not anxious or depressed appearing.  CNS: CN 2-12 intact, power, tone and  sensation normal throughout.        Assessment & Plan:

## 2011-08-23 NOTE — Assessment & Plan Note (Signed)
Controlled, no change in medication  

## 2011-08-24 ENCOUNTER — Other Ambulatory Visit: Payer: Self-pay | Admitting: Family Medicine

## 2011-08-26 ENCOUNTER — Telehealth: Payer: Self-pay

## 2011-08-26 NOTE — Telephone Encounter (Signed)
LMOM to call.

## 2011-08-27 NOTE — Telephone Encounter (Signed)
LMOM to call. Mailing a letter to call.  

## 2011-08-31 ENCOUNTER — Other Ambulatory Visit: Payer: Self-pay | Admitting: Family Medicine

## 2011-09-07 LAB — POCT I-STAT GLUCOSE: Operator id: 103831

## 2011-09-07 LAB — POCT I-STAT, CHEM 8
Calcium, Ion: 1.21
Chloride: 102
HCT: 44

## 2011-09-23 ENCOUNTER — Ambulatory Visit (INDEPENDENT_AMBULATORY_CARE_PROVIDER_SITE_OTHER): Payer: PRIVATE HEALTH INSURANCE | Admitting: Gastroenterology

## 2011-09-23 ENCOUNTER — Encounter: Payer: Self-pay | Admitting: Gastroenterology

## 2011-09-23 VITALS — BP 150/100 | HR 80 | Temp 98.0°F | Ht 70.0 in | Wt 205.2 lb

## 2011-09-23 DIAGNOSIS — Z1211 Encounter for screening for malignant neoplasm of colon: Secondary | ICD-10-CM

## 2011-09-23 NOTE — Patient Instructions (Signed)
We have set you up for a colonoscopy with Dr. Darrick Penna.  Do not take any diabetes medication the morning of the procedure.

## 2011-09-23 NOTE — Progress Notes (Signed)
Referring Provider: Syliva Overman, MD Primary Care Physician:  Syliva Overman, MD, MD Primary Gastroenterologist:  Dr. Darrick Penna  Chief Complaint  Patient presents with  . Colonoscopy    HPI:   Referred by Dr. Lodema Hong for initial screening colonoscopy. Denies melena or hematochezia. Denies abdominal pain or nausea and vomiting. No reflux. No unintentional weight loss or lack of appetite. Denies change in bowel habits. Usually has BM three times a day, which is his baseline.  No known family history of colon cancer.    Past Medical History  Diagnosis Date  . Alcohol use   . Obesity   . Nicotine addiction   . Hyperlipidemia   . Diabetes mellitus, type 2   . Hypertension   . Other and unspecified alcohol dependence, unspecified drinking behavior     Past Surgical History  Procedure Date  . None     Current Outpatient Prescriptions  Medication Sig Dispense Refill  . albuterol (PROVENTIL HFA) 108 (90 BASE) MCG/ACT inhaler Inhale 2 puffs into the lungs. EVERY 6-8 HOURS AS NEEDED       . amLODipine (NORVASC) 10 MG tablet take 1 tablet by mouth once daily  30 tablet  3  . aspirin (ASPIRIN ADULT LOW STRENGTH) 81 MG EC tablet Take 81 mg by mouth daily.        . Garlic 500 MG CAPS Take by mouth.        [name] GLIPIZIDE XL 10 MG 24 hr tablet TAKE 1 TABLET EVERY DAY. STOP GLIPIZIDE 5 MG.  30 tablet  3  . hydrochlorothiazide 25 MG tablet take 1 tablet by mouth every morning  90 tablet  0  . irbesartan (AVAPRO) 300 MG tablet take 1 tablet by mouth once daily  90 tablet  0  . KOMBIGLYZE XR 2.04-999 MG TB24 TAKE 2 TABLETS ONCE DAILY  60 tablet  3  . metoprolol (TOPROL-XL) 50 MG 24 hr tablet take 1 tablet by mouth once daily  30 tablet  3  . Omega-3 Fatty Acids (FISH OIL) 1000 MG CAPS Take by mouth daily.        [name] spironolactone (ALDACTONE) 25 MG tablet Take 1 tablet (25 mg total) by mouth daily.  30 tablet  4  . vitamin E 200 UNIT capsule Take 200 Units by mouth daily.           Allergies as of 09/23/2011  . (No Known Allergies)    Family History  Problem Relation Age of Onset  . Emphysema Mother   . Heart attack Mother   . Hypertension Mother   . Diabetes Mother   . Heart disease Mother   . COPD Mother   . Emphysema Father   . COPD Father   . Stroke Sister   . Asthma Sister   . Colon cancer Neg Hx     History   Social History  . Marital Status: Married    Spouse Name: N/A    Number of Children: N/A  . Years of Education: N/A   Occupational History  . [employer]    Social History Main Topics  . Smoking status: Current Everyday Smoker -- 0.5 packs/day    Types: Cigarettes  . Smokeless tobacco: Not on file   Comment: since early 20s  . Alcohol Use: Yes     pint will last 3 days  . Drug Use: No  . Sexually Active: Not on file   Other Topics Concern  . Not on file   Social History  Narrative  . No narrative on file    Review of Systems: Gen: Denies any fever, chills, loss of appetite, fatigue, weight loss. CV: Denies chest pain, heart palpitations, syncope, peripheral edema. Resp: Denies shortness of breath with rest, cough, wheezing GI: Denies dysphagia or odynophagia. Denies hematemesis, fecal incontinence, or jaundice.  GU : Denies urinary burning, urinary frequency, urinary incontinence.  MS: Denies joint pain, muscle weakness, cramps, limited movement Derm: Denies rash, itching, dry skin Psych: Denies depression, anxiety, confusion or memory loss  Heme: Denies bruising, bleeding, and enlarged lymph nodes.  Physical Exam: BP 150/100  Pulse 80  Temp(Src) 98 F (36.7 C) (Temporal)  Ht 5\' 10"  (1.778 m)  Wt 205 lb 3.2 oz (93.078 kg)  BMI 29.44 kg/m2 General:   Alert and oriented. Well-developed, well-nourished, pleasant and cooperative. Head:  Normocephalic and atraumatic. Eyes:  Conjunctiva pink, sclera clear, no icterus.   Conjunctiva pink. Ears:  Normal auditory acuity. Nose:  No deformity, discharge,  or  lesions. Mouth:  No deformity or lesions, mucosa pink and moist.  Neck:  Supple, without mass or thyromegaly. Lungs:  Clear to auscultation bilaterally, without wheezing, rales, or rhonchi.  Heart:  S1, S2 present without murmurs noted.  Abdomen:  +BS, soft, non-tender and non-distended. Without mass or HSM. No rebound or guarding. No hernias noted. Rectal:  Deferred  Msk:  Symmetrical without gross deformities. Normal posture. Pulses:  Normal pulses noted. Extremities:  Without clubbing or edema. Neurologic:  Alert and  oriented x4;  grossly normal neurologically. Skin:  Intact, warm and dry without significant lesions or rashes Cervical Nodes:  No significant cervical adenopathy. Psych:  Alert and cooperative. Normal mood and affect.

## 2011-09-24 DIAGNOSIS — Z1211 Encounter for screening for malignant neoplasm of colon: Secondary | ICD-10-CM | POA: Insufficient documentation

## 2011-09-24 NOTE — Progress Notes (Signed)
Cc to PCP 

## 2011-09-24 NOTE — Assessment & Plan Note (Signed)
50 year old male with need for initial screening colonoscopy. No change in bowel habits, melena, or hematochezia. Denies abdominal pain. Does have history of ETOH abuse, with drinking reportedly decreased than "before". States 1 pint will last 3 days. Due to possible difficulty sedating, will proceed with colonoscopy in the OR with Propofol.   Proceed with colonoscopy with Dr. Darrick Penna in the OR with Propofol in the near future. The risks, benefits, and alternatives have been discussed in detail with the patient. They state understanding and desire to proceed.

## 2011-09-25 ENCOUNTER — Encounter: Payer: Self-pay | Admitting: Gastroenterology

## 2011-09-25 ENCOUNTER — Other Ambulatory Visit: Payer: Self-pay | Admitting: Gastroenterology

## 2011-09-25 ENCOUNTER — Telehealth: Payer: Self-pay | Admitting: Gastroenterology

## 2011-09-25 DIAGNOSIS — Z1211 Encounter for screening for malignant neoplasm of colon: Secondary | ICD-10-CM

## 2011-09-25 NOTE — Telephone Encounter (Signed)
Pt moved to OR per AS request

## 2011-10-09 ENCOUNTER — Encounter (HOSPITAL_COMMUNITY): Admission: RE | Admit: 2011-10-09 | Payer: 59 | Source: Ambulatory Visit

## 2011-10-13 ENCOUNTER — Encounter (HOSPITAL_COMMUNITY)
Admission: RE | Admit: 2011-10-13 | Discharge: 2011-10-13 | Disposition: A | Discharge status: Home / Self Care | Payer: 59 | Source: Ambulatory Visit | Attending: Gastroenterology | Admitting: Gastroenterology

## 2011-10-13 ENCOUNTER — Other Ambulatory Visit: Payer: Self-pay

## 2011-10-13 ENCOUNTER — Telehealth: Payer: Self-pay | Admitting: Gastroenterology

## 2011-10-13 ENCOUNTER — Encounter (HOSPITAL_COMMUNITY): Payer: Self-pay

## 2011-10-13 LAB — BASIC METABOLIC PANEL
BUN: 19 mg/dL (ref 6–23)
Chloride: 103 mEq/L (ref 96–112)
Creatinine, Ser: 1.31 mg/dL (ref 0.50–1.35)
GFR calc Af Amer: 71 mL/min — ABNORMAL LOW (ref >90)

## 2011-10-13 LAB — CBC
HCT: 42.6 % (ref 39.0–52.0)
MCH: 29.8 pg (ref 26.0–34.0)
MCHC: 33.8 g/dL (ref 30.0–36.0)
MCV: 88.2 fL (ref 78.0–100.0)
RDW: 12.5 % (ref 11.5–15.5)
WBC: 7.6 10*3/uL (ref 4.0–10.5)

## 2011-10-13 NOTE — Telephone Encounter (Signed)
Pt came to window this afternoon asking if his procedure was Friday or Monday.  I told him it was scheduled for Friday 11/2 and I printed off his instructions and went over them with him. He asked about his prep Rx and I told him I would get with you in the morning to see if it was mailed or if we could fax it to [facility] in [place].

## 2011-10-13 NOTE — Patient Instructions (Addendum)
C20 Antonio Cobb  10/13/2011   Your procedure is scheduled on:  10/16/2011  Report to [facility] at  700  AM.  Call this number if you have problems the morning of surgery: [phone]   Remember:   Do not eat food:After Midnight.  Do not drink clear liquids: After Midnight.  Take these medicines the morning of surgery with A SIP OF WATER: norvasc,metoprolol.Take your albuterol inhaler before you come.   Do not wear jewelry, make-up or nail polish.  Do not wear lotions, powders, or perfumes. You may wear deodorant.  Do not shave 48 hours prior to surgery.  Do not bring valuables to the hospital.  Contacts, dentures or bridgework may not be worn into surgery.  Leave suitcase in the car. After surgery it may be brought to your room.  For patients admitted to the hospital, checkout time is 11:00 AM the day of discharge.   Patients discharged the day of surgery will not be allowed to drive home.  Name and phone number of your driver: family  Special Instructions: N/A   Please read over the following fact sheets that you were given: Pain Booklet, Surgical Site Infection Prevention, Anesthesia Post-op Instructions and Care and Recovery After Surgery olonoscopy A colonoscopy is an exam to evaluate your entire colon. In this exam, your colon is cleansed. A long fiberoptic tube is inserted through your rectum and into your colon. The fiberoptic scope (endoscope) is a long bundle of enclosed and very flexible fibers. These fibers transmit light to the area examined and send images from that area to your caregiver. Discomfort is usually minimal. You may be given a drug to help you sleep (sedative) during or prior to the procedure. This exam helps to detect lumps (tumors), polyps, inflammation, and areas of bleeding. Your caregiver may also take a small piece of tissue (biopsy) that will be examined under a microscope. LET YOUR CAREGIVER KNOW ABOUT:   Allergies to food or medicine.   Medicines  taken, including vitamins, herbs, eyedrops, over-the-counter medicines, and creams.   Use of steroids (by mouth or creams).   Previous problems with anesthetics or numbing medicines.   History of bleeding problems or blood clots.   Previous surgery.   Other health problems, including diabetes and kidney problems.   Possibility of pregnancy, if this applies.  BEFORE THE PROCEDURE   A clear liquid diet may be required for 2 days before the exam.   Ask your caregiver about changing or stopping your regular medications.   Liquid injections (enemas) or laxatives may be required.   A large amount of electrolyte solution may be given to you to drink over a short period of time. This solution is used to clean out your colon.   You should be present 60 minutes prior to your procedure or as directed by your caregiver.  AFTER THE PROCEDURE   If you received a sedative or pain relieving medication, you will need to arrange for someone to drive you home.   Occasionally, there is a little blood passed with the first bowel movement. Do not be concerned.  FINDING OUT THE RESULTS OF YOUR TEST Not all test results are available during your visit. If your test results are not back during the visit, make an appointment with your caregiver to find out the results. Do not assume everything is normal if you have not heard from your caregiver or the medical facility. It is important for you to follow up on all  of your test results. HOME CARE INSTRUCTIONS   It is not unusual to pass moderate amounts of gas and experience mild abdominal cramping following the procedure. This is due to air being used to inflate your colon during the exam. Walking or a warm pack on your belly (abdomen) may help.   You may resume all normal meals and activities after sedatives and medicines have worn off.   Only take over-the-counter or prescription medicines for pain, discomfort, or fever as directed by your caregiver. Do  not use aspirin or blood thinners if a biopsy was taken. Consult your caregiver for medicine usage if biopsies were taken.  SEEK IMMEDIATE MEDICAL CARE IF:   You have a fever.   You pass large blood clots or fill a toilet with blood following the procedure. This may also occur 10 to 14 days following the procedure. This is more likely if a biopsy was taken.   You develop abdominal pain that keeps getting worse and cannot be relieved with medicine.  Document Released: 11/27/2000 Document Revised: 08/12/2011 Document Reviewed: 07/12/2008 [facility] Patient Information 2012 [place], [place].PATIENT INSTRUCTIONS POST-ANESTHESIA  IMMEDIATELY FOLLOWING SURGERY:  Do not drive or operate machinery for the first twenty four hours after surgery.  Do not make any important decisions for twenty four hours after surgery or while taking narcotic pain medications or sedatives.  If you develop intractable nausea and vomiting or a severe headache please notify your doctor immediately.  FOLLOW-UP:  Please make an appointment with your surgeon as instructed. You do not need to follow up with anesthesia unless specifically instructed to do so.  WOUND CARE INSTRUCTIONS (if applicable):  Keep a dry clean dressing on the anesthesia/puncture wound site if there is drainage.  Once the wound has quit draining you may leave it open to air.  Generally you should leave the bandage intact for twenty four hours unless there is drainage.  If the epidural site drains for more than 36-48 hours please call the anesthesia department.  QUESTIONS?:  Please feel free to call your physician or the hospital operator if you have any questions, and they will be happy to assist you.     [facility] Anesthesia Department [address] [place] [phone]

## 2011-10-14 ENCOUNTER — Other Ambulatory Visit: Payer: Self-pay | Admitting: Gastroenterology

## 2011-10-14 DIAGNOSIS — Z1211 Encounter for screening for malignant neoplasm of colon: Secondary | ICD-10-CM

## 2011-10-14 MED ORDER — PEG-KCL-NACL-NASULF-NA ASC-C 100 G PO SOLR: 1.0000 | Freq: Once | ORAL | Status: DC

## 2011-10-14 NOTE — Telephone Encounter (Signed)
Rx faxed to [facility] - pt aware

## 2011-10-16 ENCOUNTER — Ambulatory Visit (HOSPITAL_COMMUNITY)
Admission: RE | Admit: 2011-10-16 | Discharge: 2011-10-16 | Disposition: A | Discharge status: Home / Self Care | Payer: 59 | Source: Ambulatory Visit | Attending: Gastroenterology | Admitting: Gastroenterology

## 2011-10-16 ENCOUNTER — Ambulatory Visit (HOSPITAL_COMMUNITY): Payer: 59 | Admitting: Anesthesiology

## 2011-10-16 ENCOUNTER — Other Ambulatory Visit: Payer: Self-pay | Admitting: Gastroenterology

## 2011-10-16 ENCOUNTER — Encounter (HOSPITAL_COMMUNITY): Payer: Self-pay | Admitting: *Deleted

## 2011-10-16 ENCOUNTER — Encounter (HOSPITAL_COMMUNITY): Payer: Self-pay | Admitting: Anesthesiology

## 2011-10-16 ENCOUNTER — Encounter (HOSPITAL_COMMUNITY)
Admission: RE | Disposition: A | Discharge status: Home / Self Care | Payer: Self-pay | Source: Ambulatory Visit | Attending: Gastroenterology

## 2011-10-16 ENCOUNTER — Ambulatory Visit: Admit: 2011-10-16 | Payer: Self-pay | Admitting: Gastroenterology

## 2011-10-16 DIAGNOSIS — K5730 Diverticulosis of large intestine without perforation or abscess without bleeding: Secondary | ICD-10-CM

## 2011-10-16 DIAGNOSIS — E785 Hyperlipidemia, unspecified: Secondary | ICD-10-CM | POA: Insufficient documentation

## 2011-10-16 DIAGNOSIS — I10 Essential (primary) hypertension: Secondary | ICD-10-CM | POA: Insufficient documentation

## 2011-10-16 DIAGNOSIS — D126 Benign neoplasm of colon, unspecified: Secondary | ICD-10-CM | POA: Insufficient documentation

## 2011-10-16 DIAGNOSIS — K648 Other hemorrhoids: Secondary | ICD-10-CM

## 2011-10-16 DIAGNOSIS — Z1211 Encounter for screening for malignant neoplasm of colon: Secondary | ICD-10-CM

## 2011-10-16 DIAGNOSIS — Z01812 Encounter for preprocedural laboratory examination: Secondary | ICD-10-CM | POA: Insufficient documentation

## 2011-10-16 DIAGNOSIS — Z7982 Long term (current) use of aspirin: Secondary | ICD-10-CM | POA: Insufficient documentation

## 2011-10-16 DIAGNOSIS — E119 Type 2 diabetes mellitus without complications: Secondary | ICD-10-CM | POA: Insufficient documentation

## 2011-10-16 DIAGNOSIS — Z79899 Other long term (current) drug therapy: Secondary | ICD-10-CM | POA: Insufficient documentation

## 2011-10-16 DIAGNOSIS — Z01810 Encounter for preprocedural cardiovascular examination: Secondary | ICD-10-CM | POA: Insufficient documentation

## 2011-10-16 HISTORY — PX: POLYPECTOMY: SHX5525

## 2011-10-16 LAB — GLUCOSE, CAPILLARY: Glucose-Capillary: 161 mg/dL — ABNORMAL HIGH (ref 70–99)

## 2011-10-16 SURGERY — COLONOSCOPY
Anesthesia: Moderate Sedation

## 2011-10-16 SURGERY — COLONOSCOPY WITH PROPOFOL
Anesthesia: Monitor Anesthesia Care

## 2011-10-16 MED ORDER — ONDANSETRON HCL 4 MG/2ML IJ SOLN
4.0000 mg | Freq: Once | INTRAMUSCULAR | Status: AC
  Administered 2011-10-16: 4 mg via INTRAVENOUS

## 2011-10-16 MED ORDER — ACETAMINOPHEN 325 MG PO TABS: 325.0000 mg | ORAL_TABLET | ORAL | Status: DC | PRN

## 2011-10-16 MED ORDER — GLYCOPYRROLATE 0.2 MG/ML IJ SOLN
INTRAMUSCULAR | Status: AC
  Filled 2011-10-16: qty 1

## 2011-10-16 MED ORDER — LACTATED RINGERS IV SOLN
INTRAVENOUS | Status: DC
Start: 2011-10-16 — End: 2011-10-16
  Administered 2011-10-16: 09:00:00 via INTRAVENOUS

## 2011-10-16 MED ORDER — MIDAZOLAM HCL 5 MG/5ML IJ SOLN
INTRAMUSCULAR | Status: DC | PRN
  Administered 2011-10-16: 2 mg via INTRAVENOUS

## 2011-10-16 MED ORDER — GLYCOPYRROLATE 0.2 MG/ML IJ SOLN
0.2000 mg | Freq: Once | INTRAMUSCULAR | Status: AC | PRN
  Administered 2011-10-16: 0.2 mg via INTRAVENOUS

## 2011-10-16 MED ORDER — PROPOFOL 10 MG/ML IV EMUL
INTRAVENOUS | Status: DC | PRN
  Administered 2011-10-16: 55 ug/kg/min via INTRAVENOUS

## 2011-10-16 MED ORDER — MIDAZOLAM HCL 2 MG/2ML IJ SOLN
INTRAMUSCULAR | Status: AC
  Filled 2011-10-16: qty 2

## 2011-10-16 MED ORDER — STERILE WATER FOR IRRIGATION IR SOLN
Status: DC | PRN
  Administered 2011-10-16: 11:00:00

## 2011-10-16 MED ORDER — ONDANSETRON HCL 4 MG/2ML IJ SOLN
INTRAMUSCULAR | Status: AC
  Administered 2011-10-16: 4 mg
  Filled 2011-10-16: qty 2

## 2011-10-16 MED ORDER — MIDAZOLAM HCL 2 MG/2ML IJ SOLN
1.0000 mg | INTRAMUSCULAR | Status: AC | PRN
Start: 2011-10-16 — End: 2011-10-16
  Administered 2011-10-16 (×3): 2 mg via INTRAVENOUS

## 2011-10-16 MED ORDER — FENTANYL CITRATE 0.05 MG/ML IJ SOLN: 25.0000 ug | INTRAMUSCULAR | Status: DC | PRN

## 2011-10-16 MED ORDER — PROPOFOL 10 MG/ML IV EMUL
INTRAVENOUS | Status: AC
  Filled 2011-10-16: qty 20

## 2011-10-16 MED ORDER — ONDANSETRON HCL 4 MG/2ML IJ SOLN: 4.0000 mg | Freq: Once | INTRAMUSCULAR | Status: DC | PRN

## 2011-10-16 MED ORDER — LIDOCAINE HCL 1 % IJ SOLN
INTRAMUSCULAR | Status: DC | PRN
  Administered 2011-10-16: 25 mg via INTRADERMAL

## 2011-10-16 SURGICAL SUPPLY — 22 items
ELECT REM PT RETURN 9FT ADLT (ELECTROSURGICAL) ×3
ELECTRODE REM PT RTRN 9FT ADLT (ELECTROSURGICAL) ×2 IMPLANT
FCP BXJMBJMB 240X2.8X (CUTTING FORCEPS)
FLOOR PAD 36X40 (MISCELLANEOUS) ×3
FORCEPS BIOP RAD 4 LRG CAP 4 (CUTTING FORCEPS) ×3 IMPLANT
FORCEPS BIOP RJ4 240 W/NDL (CUTTING FORCEPS)
FORCEPS BXJMBJMB 240X2.8X (CUTTING FORCEPS) IMPLANT
INJECTOR/SNARE I SNARE (MISCELLANEOUS) IMPLANT
LUBRICANT JELLY 4.5OZ STERILE (MISCELLANEOUS) ×3 IMPLANT
MANIFOLD NEPTUNE II (INSTRUMENTS) ×3 IMPLANT
NEEDLE SCLEROTHERAPY 25GX240 (NEEDLE) IMPLANT
PAD FLOOR 36X40 (MISCELLANEOUS) ×2 IMPLANT
PROBE APC STR FIRE (PROBE) IMPLANT
PROBE INJECTION GOLD (MISCELLANEOUS)
PROBE INJECTION GOLD 7FR (MISCELLANEOUS) IMPLANT
SNARE ROTATE MED OVAL 20MM (MISCELLANEOUS) IMPLANT
SNARE SHORT THROW 27M MED OVAL (MISCELLANEOUS) ×3 IMPLANT
SYR 50ML LL SCALE MARK (SYRINGE) ×3 IMPLANT
TRAP SPECIMEN MUCOUS 40CC (MISCELLANEOUS) IMPLANT
TUBING ENDO SMARTCAP PENTAX (MISCELLANEOUS) ×3 IMPLANT
TUBING IRRIGATION ENDOGATOR (MISCELLANEOUS) ×3 IMPLANT
WATER STERILE IRR 1000ML POUR (IV SOLUTION) ×6 IMPLANT

## 2011-10-16 NOTE — Anesthesia Postprocedure Evaluation (Signed)
  Anesthesia Post-op Note  Patient: Antonio Cobb  Procedure(s) Performed:  COLONOSCOPY WITH PROPOFOL - At cecum 1053; withdrawal time=17 minutes; POLYPECTOMY - Polypoid Lesion, Transverse and Sigmoid Colon  Patient Location: PACU  Anesthesia Type: MAC  Level of Consciousness: awake, alert , oriented and patient cooperative  Airway and Oxygen Therapy: Patient Spontanous Breathing  Post-op Pain: none  Post-op Assessment: Post-op Vital signs reviewed, Patient's Cardiovascular Status Stable, Respiratory Function Stable, Patent Airway and No signs of Nausea or vomiting  Post-op Vital Signs: Reviewed and stable  Complications: No apparent anesthesia complications

## 2011-10-16 NOTE — Transfer of Care (Signed)
Immediate Anesthesia Transfer of Care Note  Patient: Antonio Cobb  Procedure(s) Performed:  COLONOSCOPY WITH PROPOFOL - At cecum 1053; withdrawal time=17 minutes; POLYPECTOMY - Polypoid Lesion, Transverse and Sigmoid Colon  Patient Location: PACU  Anesthesia Type: MAC  Level of Consciousness: sedated  Airway & Oxygen Therapy: Patient Spontanous Breathing  Post-op Assessment: Report given to PACU RN, Post -op Vital signs reviewed and stable and Patient moving all extremities  Post vital signs: stable  Complications: No apparent anesthesia complications

## 2011-10-16 NOTE — Interval H&P Note (Signed)
History and Physical Interval Note:   10/16/2011   8:37 AM   Antonio Cobb  has presented today for surgery, with the diagnosis of screening  The various methods of treatment have been discussed with the patient and family. After consideration of risks, benefits and other options for treatment, the patient has consented to  Procedure(s): COLONOSCOPY WITH PROPOFOL as a surgical intervention .  The patients' history has been reviewed, patient examined, no change in status, stable for surgery.  I have reviewed the patients' chart and labs.  Questions were answered to the patient's satisfaction.     [name]  MD  THE PATIENT WAS EXAMINED AND THERE IS NO CHANGE IN THE PATIENT'S CONDITION SINCE THE ORIGINAL H&P WAS COMPLETED.

## 2011-10-16 NOTE — Anesthesia Preprocedure Evaluation (Signed)
Anesthesia Evaluation  Patient identified by MRN, date of birth, ID band Patient awake    Reviewed: Allergy & Precautions, H&P , NPO status , Patient's Chart, lab work & pertinent test results, reviewed documented beta blocker date and time   Airway Mallampati: I TM Distance: >3 FB Neck ROM: Full    Dental No notable dental hx.    Pulmonary Current Smoker,    Pulmonary exam normal       Cardiovascular hypertension, Pt. on medications and Pt. on home beta blockers Regular Normal    Neuro/Psych    GI/Hepatic negative GI ROS, Neg liver ROS,   Endo/Other  Diabetes mellitus-, Type 2, Oral Hypoglycemic Agents  Renal/GU negative Renal ROS     Musculoskeletal negative musculoskeletal ROS (+)   Abdominal Normal abdominal exam  (+)   Peds  Hematology negative hematology ROS (+)   Anesthesia Other Findings   Reproductive/Obstetrics                           Anesthesia Physical Anesthesia Plan  ASA: II  Anesthesia Plan: MAC   Post-op Pain Management:    Induction: Intravenous  Airway Management Planned: Simple Face Mask  Additional Equipment:   Intra-op Plan:   Post-operative Plan:   Informed Consent: I have reviewed the patients History and Physical, chart, labs and discussed the procedure including the risks, benefits and alternatives for the proposed anesthesia with the patient or authorized representative who has indicated his/her understanding and acceptance.     Plan Discussed with: CRNA  Anesthesia Plan Comments: (Propofol sedation)        Anesthesia Quick Evaluation

## 2011-10-16 NOTE — H&P (Signed)
Reason for Visit     Colonoscopy        Vitals - Last Recorded       BP Pulse Temp(Src) Ht Wt BMI    150/100  80  98 F (36.7 C) (Temporal)  5\' 10"  (1.778 m)  205 lb 3.2 oz (93.078 kg)  29.44 kg/m2       Progress Notes     Gerrit Halls, NP  09/24/2011  2:00 PM  Signed     Referring Provider: Syliva Overman, MD Primary Care Physician:  Syliva Overman, MD, MD Primary Gastroenterologist:  Dr. Darrick Penna    Chief Complaint   Patient presents with   .  Colonoscopy      HPI:    Referred by Dr. Lodema Hong for initial screening colonoscopy. Denies melena or hematochezia. Denies abdominal pain or nausea and vomiting. No reflux. No unintentional weight loss or lack of appetite. Denies change in bowel habits. Usually has BM three times a day, which is his baseline.   No known family history of colon cancer.       Past Medical History   Diagnosis  Date   .  Alcohol use     .  Obesity     .  Nicotine addiction     .  Hyperlipidemia     .  Diabetes mellitus, type 2     .  Hypertension     .  Other and unspecified alcohol dependence, unspecified drinking behavior         Past Surgical History   Procedure  Date   .  None         Current Outpatient Prescriptions   Medication  Sig  Dispense  Refill   .  albuterol (PROVENTIL HFA) 108 (90 BASE) MCG/ACT inhaler  Inhale 2 puffs into the lungs. EVERY 6-8 HOURS AS NEEDED          .  amLODipine (NORVASC) 10 MG tablet  take 1 tablet by mouth once daily   30 tablet   3   .  aspirin (ASPIRIN ADULT LOW STRENGTH) 81 MG EC tablet  Take 81 mg by mouth daily.           .  Garlic 500 MG CAPS  Take by mouth.           [name]  GLIPIZIDE XL 10 MG 24 hr tablet  TAKE 1 TABLET EVERY DAY. STOP GLIPIZIDE 5 MG.   30 tablet   3   .  hydrochlorothiazide 25 MG tablet  take 1 tablet by mouth every morning   90 tablet   0   .  irbesartan (AVAPRO) 300 MG tablet  take 1 tablet by mouth once daily   90 tablet   0   .  KOMBIGLYZE XR 2.04-999 MG TB24  TAKE 2  TABLETS ONCE DAILY   60 tablet   3   .  metoprolol (TOPROL-XL) 50 MG 24 hr tablet  take 1 tablet by mouth once daily   30 tablet   3   .  Omega-3 Fatty Acids (FISH OIL) 1000 MG CAPS  Take by mouth daily.           [name]  spironolactone (ALDACTONE) 25 MG tablet  Take 1 tablet (25 mg total) by mouth daily.   30 tablet   4   .  vitamin E 200 UNIT capsule  Take 200 Units by mouth daily.  Allergies as of 09/23/2011   .  (No Known Allergies)       Family History   Problem  Relation  Age of Onset   .  Emphysema  Mother     .  Heart attack  Mother     .  Hypertension  Mother     .  Diabetes  Mother     .  Heart disease  Mother     .  COPD  Mother     .  Emphysema  Father     .  COPD  Father     .  Stroke  Sister     .  Asthma  Sister     .  Colon cancer  Neg Hx         History       Social History   .  Marital Status:  Married       Spouse Name:  N/A       Number of Children:  N/A   .  Years of Education:  N/A       Occupational History   .  [employer]         Social History Main Topics   .  Smoking status:  Current Everyday Smoker -- 0.5 packs/day       Types:  Cigarettes   .  Smokeless tobacco:  Not on file     Comment: since early 20s   .  Alcohol Use:  Yes         pint will last 3 days   .  Drug Use:  No   .  Sexually Active:  Not on file       Other Topics  Concern   .  Not on file       Social History Narrative   .  No narrative on file      Review of Systems: Gen: Denies any fever, chills, loss of appetite, fatigue, weight loss. CV: Denies chest pain, heart palpitations, syncope, peripheral edema. Resp: Denies shortness of breath with rest, cough, wheezing GI: Denies dysphagia or odynophagia. Denies hematemesis, fecal incontinence, or jaundice.   GU : Denies urinary burning, urinary frequency, urinary incontinence.   MS: Denies joint pain, muscle weakness, cramps, limited movement Derm: Denies rash, itching, dry skin Psych: Denies  depression, anxiety, confusion or memory loss   Heme: Denies bruising, bleeding, and enlarged lymph nodes.   Physical Exam: BP 150/100  Pulse 80  Temp(Src) 98 F (36.7 C) (Temporal)  Ht 5\' 10"  (1.778 m)  Wt 205 lb 3.2 oz (93.078 kg)  BMI 29.44 kg/m2 General:   Alert and oriented. Well-developed, well-nourished, pleasant and cooperative. Head:  Normocephalic and atraumatic. Eyes:  Conjunctiva pink, sclera clear, no icterus.   Conjunctiva pink. Ears:  Normal auditory acuity. Nose:  No deformity, discharge,  or lesions. Mouth:  No deformity or lesions, mucosa pink and moist.   Neck:  Supple, without mass or thyromegaly. Lungs:  Clear to auscultation bilaterally, without wheezing, rales, or rhonchi.   Heart:  S1, S2 present without murmurs noted.   Abdomen:  +BS, soft, non-tender and non-distended. Without mass or HSM. No rebound or guarding. No hernias noted. Rectal:  Deferred   Msk:  Symmetrical without gross deformities. Normal posture. Pulses:  Normal pulses noted. Extremities:  Without clubbing or edema. Neurologic:  Alert and  oriented x4;  grossly normal neurologically. Skin:  Intact, warm and dry without significant lesions or rashes Cervical  Nodes:  No significant cervical adenopathy. Psych:  Alert and cooperative. Normal mood and affect.   [place] Score  09/24/2011  3:37 PM  Signed Cc to PCP     Special screening for malignant neoplasms, colon - Gerrit Halls, NP  09/24/2011  1:59 PM  Signed 50 year old male with need for initial screening colonoscopy. No change in bowel habits, melena, or hematochezia. Denies abdominal pain. Does have history of ETOH abuse, with drinking reportedly decreased than "before". States 1 pint will last 3 days. Due to possible difficulty sedating, will proceed with colonoscopy in the OR with Propofol.    Proceed with colonoscopy with Dr. Darrick Penna in the OR with Propofol in the near future. The risks, benefits, and alternatives have been discussed in  detail with the patient. They state understanding and desire to proceed.

## 2011-10-22 ENCOUNTER — Encounter (HOSPITAL_COMMUNITY): Payer: Self-pay | Admitting: Gastroenterology

## 2011-10-23 ENCOUNTER — Telehealth: Payer: Self-pay | Admitting: Gastroenterology

## 2011-10-23 NOTE — Telephone Encounter (Signed)
Please call pt. HE had simple adenomas removed from HIS colon. TCS in 10 years. High fiber diet.

## 2011-10-26 NOTE — Telephone Encounter (Signed)
Results Cc to PCP  

## 2011-10-26 NOTE — Telephone Encounter (Signed)
LMOM to call.

## 2011-10-26 NOTE — Telephone Encounter (Signed)
Pt informed

## 2011-10-26 NOTE — Telephone Encounter (Signed)
Reminder in epic to have tcs in 10 years 

## 2011-11-02 ENCOUNTER — Encounter: Payer: Self-pay | Admitting: Family Medicine

## 2011-11-08 ENCOUNTER — Other Ambulatory Visit: Payer: Self-pay | Admitting: Family Medicine

## 2011-11-11 ENCOUNTER — Encounter: Payer: Self-pay | Admitting: Family Medicine

## 2011-11-11 ENCOUNTER — Ambulatory Visit (INDEPENDENT_AMBULATORY_CARE_PROVIDER_SITE_OTHER): Payer: 59 | Admitting: Family Medicine

## 2011-11-11 VITALS — BP 150/98 | HR 85 | Resp 16 | Ht 70.0 in | Wt 207.4 lb

## 2011-11-11 DIAGNOSIS — Z23 Encounter for immunization: Secondary | ICD-10-CM

## 2011-11-11 DIAGNOSIS — F1010 Alcohol abuse, uncomplicated: Secondary | ICD-10-CM

## 2011-11-11 DIAGNOSIS — I10 Essential (primary) hypertension: Secondary | ICD-10-CM

## 2011-11-11 DIAGNOSIS — Z1211 Encounter for screening for malignant neoplasm of colon: Secondary | ICD-10-CM

## 2011-11-11 DIAGNOSIS — E785 Hyperlipidemia, unspecified: Secondary | ICD-10-CM

## 2011-11-11 DIAGNOSIS — Z0000 Encounter for general adult medical examination without abnormal findings: Secondary | ICD-10-CM

## 2011-11-11 DIAGNOSIS — Z125 Encounter for screening for malignant neoplasm of prostate: Secondary | ICD-10-CM

## 2011-11-11 DIAGNOSIS — R5383 Other fatigue: Secondary | ICD-10-CM

## 2011-11-11 DIAGNOSIS — E119 Type 2 diabetes mellitus without complications: Secondary | ICD-10-CM

## 2011-11-11 DIAGNOSIS — F17200 Nicotine dependence, unspecified, uncomplicated: Secondary | ICD-10-CM

## 2011-11-11 NOTE — Progress Notes (Signed)
  Subjective:    Patient ID: Antonio Cobb, male    DOB: 1961/08/13, 50 y.o.   MRN: [id number]  HPI The PT is here for annual exam  and re-evaluation of chronic medical conditions, medication management and review of any available recent lab and radiology data.  Preventive health is updated, specifically  Cancer screening and Immunization.  Questions or concerns regarding consultations or procedures which the PT has had in the interim are  addressed. The PT denies any adverse reactions to current medications since the last visit.  He continues to smoke and drink alcohol on a daily basis to the detriment of his health    Review of Systems See HPI Denies recent fever or chills. Denies sinus pressure, nasal congestion, ear pain or sore throat. Denies chest congestion, productive cough or wheezing. Denies chest pains, palpitations and leg swelling Denies abdominal pain, nausea, vomiting,diarrhea or constipation.   Denies dysuria, frequency, hesitancy or incontinence. Denies joint pain, swelling and limitation in mobility. Denies headaches, seizures, numbness, or tingling. Denies depression, anxiety or insomnia. Denies skin break down or rash.        Objective:   Physical Exam Pleasant well nourished male, alert and oriented x 3, in no cardio-pulmonary distress. Afebrile. HEENT No facial trauma or asymetry. Sinuses non tender. EOMI, PERTL, fundoscopic exam is negative for hemorhages or exudates. External ears normal, tympanic membranes clear. Oropharynx moist, no exudate,fair  dentition. Neck: supple, no adenopathy,JVD or thyromegaly.No bruits.  Chest: Clear to ascultation bilaterally.No crackles or wheezes.reduced air entry Non tender to palpation  Breast: No asymetry,no masses. No nipple discharge or inversion. No axillary or supraclavicular adenopathy  Cardiovascular system; Heart sounds normal,  S1 and  S2 ,no S3.  No murmur, or thrill. Apical beat not  displaced Peripheral pulses normal.  Abdomen: Soft, non tender, no organomegaly or masses. No bruits. Bowel sounds normal. No guarding, tenderness or rebound.  Rectal:  No mass. guaiac negative stool. Prostate smooth and firm  GU: Not examined  Musculoskeletal exam: Full ROM of spine, hips , shoulders and knees. No deformity ,swelling or crepitus noted. No muscle wasting or atrophy.   Neurologic: Cranial nerves 2 to 12 intact. Power, tone ,sensation and reflexes normal throughout. No disturbance in gait. No tremor.  Skin: Intact, no ulceration, erythema , scaling or rash noted. Pigmentation normal throughout  Psych; Normal mood and affect. Judgement and concentration normal  Diabetic Foot Check:  Appearance -  calluses Skin - tinea pedis  Sensation - grossly intact to light touch Monofilament testing -  Right - Great toe, medial, central, lateral ball and posterior foot diminished Left - Great toe, medial, central, lateral ball and posterior foot diminished Pulses Left - Dorsalis Pedis and Posterior Tibia normal Right - Dorsalis Pedis and Posterior Tibia normal          Assessment & Plan:

## 2011-11-11 NOTE — Patient Instructions (Signed)
F/u in 4 months.  Labs today.  Fasting labs in 4 months.  Please plan to quit nicotine and alcohol use, this will improve your health, work at it!  Blood pressure is too high today.  We will call with labs so your medication for diabetes can be adjusted if needed  Flu vaccine today

## 2011-11-12 LAB — HEMOGLOBIN A1C
Hgb A1c MFr Bld: 8.8 % — ABNORMAL HIGH (ref <5.7)
Mean Plasma Glucose: 206 mg/dL — ABNORMAL HIGH (ref <117)

## 2011-11-12 NOTE — Progress Notes (Signed)
TCS NOV 2012 SIMPLE ADENOMA

## 2011-11-15 NOTE — Assessment & Plan Note (Signed)
Unchanged, needs to quit 

## 2011-11-15 NOTE — Assessment & Plan Note (Signed)
Low fat diet discussed and encouraged, pt is non compliant with this. Updated labs needed

## 2011-11-15 NOTE — Assessment & Plan Note (Signed)
Rectal exam gauic neg stool had colonoscopy this year

## 2011-11-15 NOTE — Assessment & Plan Note (Signed)
Unchanged, counseled re damage to heart, liver , and adverse effect on blood sugar, need to quit

## 2011-11-15 NOTE — Assessment & Plan Note (Signed)
Uncontrolled, ongoing excessive alcohol use

## 2011-12-06 ENCOUNTER — Other Ambulatory Visit: Payer: Self-pay | Admitting: Family Medicine

## 2011-12-17 ENCOUNTER — Telehealth: Payer: Self-pay | Admitting: Family Medicine

## 2011-12-18 NOTE — Telephone Encounter (Signed)
Called pt, he said when he checked it again it was 200 and this morning it was 160. He states he will keep an eye on it and call if it started going back up again. States he ate a lot of sweets over the holidays

## 2012-01-21 ENCOUNTER — Encounter: Payer: Self-pay | Admitting: Family Medicine

## 2012-01-21 ENCOUNTER — Ambulatory Visit (HOSPITAL_COMMUNITY)
Admission: RE | Admit: 2012-01-21 | Discharge: 2012-01-21 | Disposition: A | Discharge status: Home / Self Care | Payer: 59 | Source: Ambulatory Visit | Attending: Family Medicine | Admitting: Family Medicine

## 2012-01-21 ENCOUNTER — Ambulatory Visit (INDEPENDENT_AMBULATORY_CARE_PROVIDER_SITE_OTHER): Payer: 59 | Admitting: Family Medicine

## 2012-01-21 ENCOUNTER — Encounter (HOSPITAL_COMMUNITY): Payer: Self-pay

## 2012-01-21 VITALS — BP 130/98 | HR 89 | Temp 98.8°F | Resp 16 | Ht 70.0 in | Wt 203.0 lb

## 2012-01-21 DIAGNOSIS — E1165 Type 2 diabetes mellitus with hyperglycemia: Secondary | ICD-10-CM

## 2012-01-21 DIAGNOSIS — E785 Hyperlipidemia, unspecified: Secondary | ICD-10-CM

## 2012-01-21 DIAGNOSIS — J209 Acute bronchitis, unspecified: Secondary | ICD-10-CM

## 2012-01-21 DIAGNOSIS — F1010 Alcohol abuse, uncomplicated: Secondary | ICD-10-CM

## 2012-01-21 DIAGNOSIS — R079 Chest pain, unspecified: Secondary | ICD-10-CM | POA: Insufficient documentation

## 2012-01-21 DIAGNOSIS — I10 Essential (primary) hypertension: Secondary | ICD-10-CM

## 2012-01-21 DIAGNOSIS — F17200 Nicotine dependence, unspecified, uncomplicated: Secondary | ICD-10-CM

## 2012-01-21 MED ORDER — PENICILLIN V POTASSIUM 500 MG PO TABS: 500.0000 mg | ORAL_TABLET | Freq: Three times a day (TID) | ORAL | Status: AC

## 2012-01-21 MED ORDER — BENZONATATE 100 MG PO CAPS: 100.0000 mg | ORAL_CAPSULE | Freq: Four times a day (QID) | ORAL | Status: DC | PRN

## 2012-01-21 MED ORDER — CEFTRIAXONE SODIUM 1 G IJ SOLR
500.0000 mg | Freq: Once | INTRAMUSCULAR | Status: AC
  Administered 2012-01-21: 500 mg via INTRAMUSCULAR

## 2012-01-21 MED ORDER — PREDNISONE (PAK) 5 MG PO TABS: 5.0000 mg | ORAL_TABLET | ORAL | Status: DC

## 2012-01-21 MED ORDER — IPRATROPIUM BROMIDE 0.02 % IN SOLN
0.5000 mg | Freq: Once | RESPIRATORY_TRACT | Status: AC
  Administered 2012-01-21: 0.5 mg via RESPIRATORY_TRACT

## 2012-01-21 MED ORDER — ALBUTEROL SULFATE (5 MG/ML) 0.5% IN NEBU
2.5000 mg | INHALATION_SOLUTION | Freq: Once | RESPIRATORY_TRACT | Status: AC
  Administered 2012-01-21: 2.5 mg via RESPIRATORY_TRACT

## 2012-01-21 MED ORDER — METHYLPREDNISOLONE ACETATE 80 MG/ML IJ SUSP
80.0000 mg | Freq: Once | INTRAMUSCULAR | Status: AC
  Administered 2012-01-21: 80 mg via INTRAMUSCULAR

## 2012-01-21 NOTE — Assessment & Plan Note (Signed)
Improved, but still uncontrolled

## 2012-01-21 NOTE — Patient Instructions (Addendum)
F/u in 3 .5 month  You are being treated for acute bronchitis. Breathing treatment in the office , also rocephin and depo medrol injection. Medication has been sent to your pharmacy also.  You need labs and a CXr today.  Please drink a lot of fluids and get suffucient rest   Acute Bronchitis Bronchitis is when the organs and tissues involved in breathing get puffy (swollen) and can leak fluid. This makes it harder for air to get in and out of the lungs. You may cough a lot and produce thick spit (mucus). Acute means the illness started suddenly. HOME CARE  Rest.   Drink enough fluids to keep the pee (urine) clear or pale yellow.   Medicines may be given that will open up your airways to help you breathe better. Only take medicine as told by your doctor.   Use a cool mist vaporizer. This will help to thin any thick spit.   Do not smoke. Avoid secondhand smoke.  GET HELP RIGHT AWAY IF:   You have a temperature by mouth above 102 F (38.9 C), not controlled by medicine.   You have chills.   You develop severe shortness of breath or chest pain.   You have bloody spit mixed with mucus (sputum).   You throw up (vomit) often.   You lose too much body fluid (dehydrated).   You have a severe headache.   You feel faint.   You do not improve after 1 week of treatment.  MAKE SURE YOU:   Understand these instructions.   Will watch your condition.   Will get help right away if you are not doing well or get worse.  Document Released: 05/18/2008 Document Revised: 08/12/2011 Document Reviewed: 12/18/2009 [facility] Patient Information 2012 [place], [place].

## 2012-01-22 LAB — CBC WITH DIFFERENTIAL/PLATELET
Basophils Absolute: 0.1 10*3/uL (ref 0.0–0.1)
Basophils Relative: 1 % (ref 0–1)
Eosinophils Absolute: 0.5 10*3/uL (ref 0.0–0.7)
Eosinophils Relative: 5 % (ref 0–5)
Lymphocytes Relative: 40 % (ref 12–46)
MCHC: 33.8 g/dL (ref 30.0–36.0)
MCV: 87.5 fL (ref 78.0–100.0)
Monocytes Absolute: 1 10*3/uL (ref 0.1–1.0)
Platelets: 251 10*3/uL (ref 150–400)
RDW: 12.5 % (ref 11.5–15.5)
WBC: 9.9 10*3/uL (ref 4.0–10.5)

## 2012-01-22 LAB — LIPID PANEL
HDL: 33 mg/dL — ABNORMAL LOW (ref >39)
Total CHOL/HDL Ratio: 6.4 Ratio
Triglycerides: 599 mg/dL — ABNORMAL HIGH (ref <150)

## 2012-01-22 LAB — HEMOGLOBIN A1C: Mean Plasma Glucose: 249 mg/dL — ABNORMAL HIGH (ref <117)

## 2012-01-22 LAB — COMPLETE METABOLIC PANEL WITH GFR
ALT: 32 U/L (ref 0–53)
AST: 19 U/L (ref 0–37)
Albumin: 4.5 g/dL (ref 3.5–5.2)
Alkaline Phosphatase: 90 U/L (ref 39–117)
Calcium: 10 mg/dL (ref 8.4–10.5)
Chloride: 98 mEq/L (ref 96–112)
Creat: 1.06 mg/dL (ref 0.50–1.35)
Potassium: 3.6 mEq/L (ref 3.5–5.3)

## 2012-01-29 ENCOUNTER — Other Ambulatory Visit: Payer: Self-pay | Admitting: Family Medicine

## 2012-01-31 NOTE — Assessment & Plan Note (Signed)
Deteriorated, non compliance with treatment plan and diet a continued issue, needs to be followed by endo, will strongly recommend this, I have done this in the past

## 2012-01-31 NOTE — Assessment & Plan Note (Signed)
Uncontrolled, due to poor diet as well as non compliance with medication and treatment plan

## 2012-01-31 NOTE — Assessment & Plan Note (Signed)
Unchanged , cessation counseling done 

## 2012-01-31 NOTE — Assessment & Plan Note (Signed)
Rocephin in office, and antibiotics and decongestants. CXR . Smoking cessation counseling done also

## 2012-01-31 NOTE — Assessment & Plan Note (Signed)
Continued use despite adverse effects on life, no interest in formal rehab

## 2012-01-31 NOTE — Progress Notes (Signed)
  Subjective:    Patient ID: Antonio Cobb, male    DOB: Jul 31, 1961, 51 y.o.   MRN: [id number]  HPI Pt in with a 1 week h/o increased cough and chest congestion, wheezing , espescially at night states he got the illness from his wife who was recently treated. Denies sinus pressure ear pain or sore throat. Pt remains non compliant with diabetic treatment plan, he continues to smoke with no quit date being set , despite acute infection, and he continues to drink alcohol   Review of Systems See HPI C/o intermittent chills and fever in the past week  Denies chest pains, palpitations and leg swelling Denies abdominal pain, nausea, vomiting,diarrhea or constipation.   Denies dysuria, frequency, hesitancy or incontinence. Denies joint pain, swelling and limitation in mobility. Denies headaches, seizures, numbness, or tingling. Denies depression, anxiety or insomnia. Denies skin break down or rash.        Objective:   Physical Exam Patient alert and oriented and in no cardiopulmonary distress.Ill appearing  HEENT: No facial asymmetry, EOMI, no sinus tenderness,  oropharynx pink and moist.  Neck supple no adenopathy.  Chest: decreased air entry, bilateral crakles and wkeezes  CVS: S1, S2 no murmurs, no S3.  ABD: Soft non tender. Bowel sounds normal.  Ext: No edema  MS: Adequate ROM spine, shoulders, hips and knees.  Skin: Intact, no ulcerations or rash noted.  Psych: Good eye contact, normal affect. Memory intact not anxious or depressed appearing.  CNS: CN 2-12 intact, power, tone and sensation normal throughout.        Assessment & Plan:

## 2012-02-01 ENCOUNTER — Other Ambulatory Visit: Payer: Self-pay | Admitting: Family Medicine

## 2012-02-01 DIAGNOSIS — E1129 Type 2 diabetes mellitus with other diabetic kidney complication: Secondary | ICD-10-CM

## 2012-03-10 ENCOUNTER — Ambulatory Visit: Payer: 59 | Admitting: Family Medicine

## 2012-03-12 ENCOUNTER — Other Ambulatory Visit: Payer: Self-pay | Admitting: Family Medicine

## 2012-03-16 ENCOUNTER — Other Ambulatory Visit: Payer: Self-pay | Admitting: Family Medicine

## 2012-03-17 ENCOUNTER — Other Ambulatory Visit: Payer: Self-pay | Admitting: Family Medicine

## 2012-04-14 ENCOUNTER — Other Ambulatory Visit: Payer: Self-pay | Admitting: Family Medicine

## 2012-05-10 ENCOUNTER — Encounter: Payer: Self-pay | Admitting: Family Medicine

## 2012-05-10 ENCOUNTER — Ambulatory Visit (INDEPENDENT_AMBULATORY_CARE_PROVIDER_SITE_OTHER): Payer: 59 | Admitting: Family Medicine

## 2012-05-10 VITALS — BP 124/92 | HR 87 | Resp 18 | Ht 70.0 in | Wt 200.1 lb

## 2012-05-10 DIAGNOSIS — E785 Hyperlipidemia, unspecified: Secondary | ICD-10-CM

## 2012-05-10 DIAGNOSIS — N529 Male erectile dysfunction, unspecified: Secondary | ICD-10-CM

## 2012-05-10 DIAGNOSIS — E663 Overweight: Secondary | ICD-10-CM

## 2012-05-10 DIAGNOSIS — F1010 Alcohol abuse, uncomplicated: Secondary | ICD-10-CM

## 2012-05-10 DIAGNOSIS — E1165 Type 2 diabetes mellitus with hyperglycemia: Secondary | ICD-10-CM

## 2012-05-10 DIAGNOSIS — I10 Essential (primary) hypertension: Secondary | ICD-10-CM

## 2012-05-10 DIAGNOSIS — IMO0002 Reserved for concepts with insufficient information to code with codable children: Secondary | ICD-10-CM

## 2012-05-10 MED ORDER — SPIRONOLACTONE 25 MG PO TABS: 25.0000 mg | ORAL_TABLET | Freq: Every day | ORAL | Status: DC

## 2012-05-10 MED ORDER — TADALAFIL 10 MG PO TABS: 10.0000 mg | ORAL_TABLET | ORAL | Status: DC | PRN

## 2012-05-10 MED ORDER — HYDROCHLOROTHIAZIDE 25 MG PO TABS: 25.0000 mg | ORAL_TABLET | Freq: Every day | ORAL | Status: DC

## 2012-05-10 MED ORDER — TADALAFIL 5 MG PO TABS: 5.0000 mg | ORAL_TABLET | Freq: Every day | ORAL | Status: DC | PRN

## 2012-05-10 MED ORDER — IRBESARTAN 300 MG PO TABS: 300.0000 mg | ORAL_TABLET | Freq: Every day | ORAL | Status: DC

## 2012-05-10 MED ORDER — AMLODIPINE BESYLATE 10 MG PO TABS: 10.0000 mg | ORAL_TABLET | Freq: Every day | ORAL | Status: DC

## 2012-05-10 NOTE — Patient Instructions (Addendum)
F/u in 3.5 month  New medication for erectile dysfunction, use two daily as needed for intercourse only. Lasts for 3 days, so maximum is two per week  You need top stop smoking and drinking this is negatively affecting your health  Fasting lipid, cmp and EGFR, HBa1C  Next Monday. Labs to be sent to Dr Fransico Him also.  Call and make appt with Dr Fransico Him for next week

## 2012-05-10 NOTE — Progress Notes (Signed)
  Subjective:    Patient ID: Antonio Cobb, male    DOB: 1960/12/31, 51 y.o.   MRN: [id number]  HPI The PT is here for follow up and re-evaluation of chronic medical conditions, medication management and review of any available recent lab and radiology data.  Preventive health is updated, specifically  Cancer screening and Immunization.   Questions or concerns regarding consultations or procedures which the PT has had in the interim are  addressed. The PT denies any adverse reactions to current medications since the last visit.  Currently under the care of endo for DM, reports improved control, however unfortunately his alcohol use is still a big problem, asis his smoking.States though he will call himself alcoholic, unwilling to go to AA, will consider use of medication in the future C/o increased difficulty in maintaining erections, desire is present wants help     Review of Systems See HPI Denies recent fever or chills. Denies sinus pressure, nasal congestion, ear pain or sore throat. Denies chest congestion, productive cough or wheezing. Denies chest pains, palpitations and leg swelling Denies abdominal pain, nausea, vomiting,diarrhea or constipation.   Denies dysuria, frequency, hesitancy or incontinence. Denies joint pain, swelling and limitation in mobility. Denies headaches, seizures, numbness, or tingling. Denies depression, anxiety or insomnia. Denies skin break down or rash.        Objective:   Physical Exam Patient alert and oriented and in no cardiopulmonary distress.  HEENT: No facial asymmetry, EOMI, no sinus tenderness,  oropharynx pink and moist.  Neck supple no adenopathy.  Chest: Clear to auscultation bilaterally.Decreased air entry throughout  CVS: S1, S2 no murmurs, no S3.  ABD: Soft non tender. Bowel sounds normal.  Ext: No edema  MS: Adequate ROM spine, shoulders, hips and knees.  Skin: Intact, no ulcerations or rash noted.  Psych: Good eye  contact, normal affect. Memory intact not anxious or depressed appearing.  CNS: CN 2-12 intact, power, tone and sensation normal throughout.        Assessment & Plan:

## 2012-05-15 NOTE — Assessment & Plan Note (Signed)
unchanged Patient re-educated about  the importance of commitment to a  minimum of 150 minutes of exercise per week. The importance of healthy food choices with portion control discussed. Encouraged to start a food diary, count calories and to consider  joining a support group. Sample diet sheets offered. Goals set by the patient for the next several months.    

## 2012-05-15 NOTE — Assessment & Plan Note (Signed)
Unchanged, pt is alcoholic, needs treatment/counseling

## 2012-05-15 NOTE — Assessment & Plan Note (Signed)
Uncontrolled at this visit, was drinking excess alcohol 1 day ago

## 2012-05-15 NOTE — Assessment & Plan Note (Signed)
Deterioration in performance, requests medication trial, wife present and nbi agreement

## 2012-05-15 NOTE — Assessment & Plan Note (Signed)
Remains uncontrolled, but will hopefully improve, the patient's alcoholism is a main factor prohibiting control

## 2012-05-15 NOTE — Assessment & Plan Note (Signed)
Hyperlipidemia:Low fat diet discussed and encouraged.  Updated lab next visit 

## 2012-05-17 LAB — COMPLETE METABOLIC PANEL WITH GFR
ALT: 31 U/L (ref 0–53)
Albumin: 4.1 g/dL (ref 3.5–5.2)
CO2: 24 mEq/L (ref 19–32)
Chloride: 101 mEq/L (ref 96–112)
GFR, Est African American: >89 mL/min
GFR, Est Non African American: 89 mL/min
Glucose, Bld: 129 mg/dL — ABNORMAL HIGH (ref 70–99)
Potassium: 3.5 mEq/L (ref 3.5–5.3)
Sodium: 135 mEq/L (ref 135–145)
Total Protein: 6.8 g/dL (ref 6.0–8.3)

## 2012-05-17 LAB — LIPID PANEL: LDL Cholesterol: 85 mg/dL (ref 0–99)

## 2012-05-19 ENCOUNTER — Other Ambulatory Visit: Payer: Self-pay | Admitting: Family Medicine

## 2012-05-23 ENCOUNTER — Inpatient Hospital Stay (HOSPITAL_COMMUNITY)
Admission: EM | Admit: 2012-05-23 | Discharge: 2012-05-25 | DRG: 247 | Disposition: A | Discharge status: Home / Self Care | Payer: 59 | Attending: Cardiovascular Disease | Admitting: Cardiovascular Disease

## 2012-05-23 ENCOUNTER — Other Ambulatory Visit: Payer: Self-pay

## 2012-05-23 ENCOUNTER — Encounter (HOSPITAL_COMMUNITY): Payer: Self-pay | Admitting: *Deleted

## 2012-05-23 ENCOUNTER — Ambulatory Visit (HOSPITAL_COMMUNITY): Admit: 2012-05-23 | Payer: Self-pay | Admitting: Cardiology

## 2012-05-23 ENCOUNTER — Encounter (HOSPITAL_COMMUNITY)
Admission: EM | Disposition: A | Discharge status: Home / Self Care | Payer: Self-pay | Source: Home / Self Care | Attending: Cardiovascular Disease

## 2012-05-23 DIAGNOSIS — I219 Acute myocardial infarction, unspecified: Secondary | ICD-10-CM

## 2012-05-23 DIAGNOSIS — Z7982 Long term (current) use of aspirin: Secondary | ICD-10-CM

## 2012-05-23 DIAGNOSIS — I10 Essential (primary) hypertension: Secondary | ICD-10-CM | POA: Diagnosis present

## 2012-05-23 DIAGNOSIS — Z955 Presence of coronary angioplasty implant and graft: Secondary | ICD-10-CM

## 2012-05-23 DIAGNOSIS — Z720 Tobacco use: Secondary | ICD-10-CM

## 2012-05-23 DIAGNOSIS — IMO0001 Reserved for inherently not codable concepts without codable children: Secondary | ICD-10-CM | POA: Diagnosis present

## 2012-05-23 DIAGNOSIS — Z8249 Family history of ischemic heart disease and other diseases of the circulatory system: Secondary | ICD-10-CM

## 2012-05-23 DIAGNOSIS — F1020 Alcohol dependence, uncomplicated: Secondary | ICD-10-CM | POA: Diagnosis present

## 2012-05-23 DIAGNOSIS — Z6828 Body mass index (BMI) 28.0-28.9, adult: Secondary | ICD-10-CM

## 2012-05-23 DIAGNOSIS — E785 Hyperlipidemia, unspecified: Secondary | ICD-10-CM | POA: Diagnosis present

## 2012-05-23 DIAGNOSIS — I213 ST elevation (STEMI) myocardial infarction of unspecified site: Secondary | ICD-10-CM

## 2012-05-23 DIAGNOSIS — I428 Other cardiomyopathies: Secondary | ICD-10-CM | POA: Diagnosis present

## 2012-05-23 DIAGNOSIS — I2510 Atherosclerotic heart disease of native coronary artery without angina pectoris: Secondary | ICD-10-CM

## 2012-05-23 DIAGNOSIS — F17200 Nicotine dependence, unspecified, uncomplicated: Secondary | ICD-10-CM | POA: Diagnosis present

## 2012-05-23 DIAGNOSIS — E669 Obesity, unspecified: Secondary | ICD-10-CM | POA: Diagnosis present

## 2012-05-23 DIAGNOSIS — I2582 Chronic total occlusion of coronary artery: Secondary | ICD-10-CM | POA: Diagnosis present

## 2012-05-23 DIAGNOSIS — I2119 ST elevation (STEMI) myocardial infarction involving other coronary artery of inferior wall: Principal | ICD-10-CM | POA: Diagnosis present

## 2012-05-23 DIAGNOSIS — F1010 Alcohol abuse, uncomplicated: Secondary | ICD-10-CM

## 2012-05-23 DIAGNOSIS — N529 Male erectile dysfunction, unspecified: Secondary | ICD-10-CM

## 2012-05-23 DIAGNOSIS — Z860100 Personal history of colon polyps, unspecified: Secondary | ICD-10-CM

## 2012-05-23 DIAGNOSIS — I200 Unstable angina: Secondary | ICD-10-CM

## 2012-05-23 DIAGNOSIS — E7800 Pure hypercholesterolemia, unspecified: Secondary | ICD-10-CM | POA: Diagnosis present

## 2012-05-23 DIAGNOSIS — E782 Mixed hyperlipidemia: Secondary | ICD-10-CM | POA: Diagnosis present

## 2012-05-23 HISTORY — PX: LEFT HEART CATHETERIZATION WITH CORONARY ANGIOGRAM: SHX5451

## 2012-05-23 HISTORY — PX: PERCUTANEOUS CORONARY STENT INTERVENTION (PCI-S): SHX5485

## 2012-05-23 HISTORY — DX: Acute myocardial infarction, unspecified: I21.9

## 2012-05-23 HISTORY — PX: CORONARY STENT PLACEMENT: SHX1402

## 2012-05-23 LAB — COMPREHENSIVE METABOLIC PANEL
ALT: 56 U/L — ABNORMAL HIGH (ref 0–53)
AST: 221 U/L — ABNORMAL HIGH (ref 0–37)
Alkaline Phosphatase: 92 U/L (ref 39–117)
Calcium: 9.5 mg/dL (ref 8.4–10.5)
Potassium: 3.7 mEq/L (ref 3.5–5.1)
Sodium: 133 mEq/L — ABNORMAL LOW (ref 135–145)
Total Protein: 8 g/dL (ref 6.0–8.3)

## 2012-05-23 LAB — CBC
HCT: 46.2 % (ref 39.0–52.0)
Hemoglobin: 15.7 g/dL (ref 13.0–17.0)
Hemoglobin: 16.4 g/dL (ref 13.0–17.0)
MCH: 29.8 pg (ref 26.0–34.0)
MCH: 30.4 pg (ref 26.0–34.0)
MCHC: 34.7 g/dL (ref 30.0–36.0)
Platelets: 243 10*3/uL (ref 150–400)
RBC: 5.39 MIL/uL (ref 4.22–5.81)
RDW: 12.4 % (ref 11.5–15.5)

## 2012-05-23 LAB — CARDIAC PANEL(CRET KIN+CKTOT+MB+TROPI)
CK, MB: 348.3 ng/mL (ref 0.3–4.0)
Relative Index: 8.2 — ABNORMAL HIGH (ref 0.0–2.5)
Relative Index: 9.9 — ABNORMAL HIGH (ref 0.0–2.5)
Total CK: 3089 U/L — ABNORMAL HIGH (ref 7–232)
Total CK: 3504 U/L — ABNORMAL HIGH (ref 7–232)
Troponin I: >20 ng/mL (ref <0.30)

## 2012-05-23 LAB — PROTIME-INR
INR: 0.89 (ref 0.00–1.49)
Prothrombin Time: 12.2 seconds (ref 11.6–15.2)

## 2012-05-23 LAB — MRSA PCR SCREENING: MRSA by PCR: NEGATIVE

## 2012-05-23 SURGERY — LEFT HEART CATHETERIZATION WITH CORONARY ANGIOGRAM
Anesthesia: LOCAL

## 2012-05-23 MED ORDER — ASPIRIN 81 MG PO CHEW
CHEWABLE_TABLET | ORAL | Status: AC
  Administered 2012-05-23: 324 mg
  Filled 2012-05-23: qty 4

## 2012-05-23 MED ORDER — ACETAMINOPHEN 325 MG PO TABS: 650.0000 mg | ORAL_TABLET | ORAL | Status: DC | PRN

## 2012-05-23 MED ORDER — LIDOCAINE HCL (PF) 1 % IJ SOLN
INTRAMUSCULAR | Status: AC
  Filled 2012-05-23: qty 30

## 2012-05-23 MED ORDER — AMLODIPINE BESYLATE 10 MG PO TABS
10.0000 mg | ORAL_TABLET | Freq: Every day | ORAL | Status: DC
  Administered 2012-05-23 – 2012-05-25 (×3): 10 mg via ORAL
  Filled 2012-05-23 (×3): qty 1

## 2012-05-23 MED ORDER — HEPARIN BOLUS VIA INFUSION
4000.0000 [IU] | Freq: Once | INTRAVENOUS | Status: AC
  Administered 2012-05-23: 4000 [IU] via INTRAVENOUS

## 2012-05-23 MED ORDER — SODIUM CHLORIDE 0.9 % IJ SOLN: 3.0000 mL | INTRAMUSCULAR | Status: DC | PRN

## 2012-05-23 MED ORDER — VITAMIN B-1 100 MG PO TABS
100.0000 mg | ORAL_TABLET | Freq: Every day | ORAL | Status: DC
  Administered 2012-05-23 – 2012-05-25 (×3): 100 mg via ORAL
  Filled 2012-05-23 (×3): qty 1

## 2012-05-23 MED ORDER — INSULIN ASPART 100 UNIT/ML ~~LOC~~ SOLN
0.0000 [IU] | Freq: Three times a day (TID) | SUBCUTANEOUS | Status: DC
  Administered 2012-05-23 – 2012-05-24 (×2): 7 [IU] via SUBCUTANEOUS
  Administered 2012-05-24 (×2): 11 [IU] via SUBCUTANEOUS
  Administered 2012-05-25: 7 [IU] via SUBCUTANEOUS

## 2012-05-23 MED ORDER — ALPRAZOLAM 0.25 MG PO TABS: 0.2500 mg | ORAL_TABLET | Freq: Two times a day (BID) | ORAL | Status: DC | PRN

## 2012-05-23 MED ORDER — HYDROCHLOROTHIAZIDE 25 MG PO TABS
25.0000 mg | ORAL_TABLET | Freq: Every day | ORAL | Status: DC
  Administered 2012-05-23 – 2012-05-25 (×3): 25 mg via ORAL
  Filled 2012-05-23 (×3): qty 1

## 2012-05-23 MED ORDER — IRBESARTAN 300 MG PO TABS
300.0000 mg | ORAL_TABLET | Freq: Every day | ORAL | Status: DC
Start: 2012-05-23 — End: 2012-05-25
  Administered 2012-05-23 – 2012-05-25 (×3): 300 mg via ORAL
  Filled 2012-05-23 (×3): qty 1

## 2012-05-23 MED ORDER — ONDANSETRON HCL 4 MG/2ML IJ SOLN: 4.0000 mg | Freq: Four times a day (QID) | INTRAMUSCULAR | Status: DC | PRN

## 2012-05-23 MED ORDER — HEPARIN (PORCINE) IN NACL 2-0.9 UNIT/ML-% IJ SOLN
INTRAMUSCULAR | Status: AC
  Filled 2012-05-23: qty 2000

## 2012-05-23 MED ORDER — SODIUM CHLORIDE 0.9 % IJ SOLN: 3.0000 mL | Freq: Two times a day (BID) | INTRAMUSCULAR | Status: DC

## 2012-05-23 MED ORDER — METOPROLOL SUCCINATE ER 50 MG PO TB24
50.0000 mg | ORAL_TABLET | Freq: Every day | ORAL | Status: DC
  Administered 2012-05-23 – 2012-05-24 (×2): 50 mg via ORAL
  Filled 2012-05-23 (×3): qty 1

## 2012-05-23 MED ORDER — FOLIC ACID 1 MG PO TABS
1.0000 mg | ORAL_TABLET | Freq: Every day | ORAL | Status: DC
  Administered 2012-05-23 – 2012-05-25 (×3): 1 mg via ORAL
  Filled 2012-05-23 (×3): qty 1

## 2012-05-23 MED ORDER — MIDAZOLAM HCL 2 MG/2ML IJ SOLN
INTRAMUSCULAR | Status: AC
  Filled 2012-05-23: qty 2

## 2012-05-23 MED ORDER — BIVALIRUDIN 250 MG IV SOLR
INTRAVENOUS | Status: AC
  Filled 2012-05-23: qty 250

## 2012-05-23 MED ORDER — ENOXAPARIN SODIUM 40 MG/0.4ML ~~LOC~~ SOLN
40.0000 mg | SUBCUTANEOUS | Status: DC
  Administered 2012-05-23 – 2012-05-24 (×2): 40 mg via SUBCUTANEOUS
  Filled 2012-05-23 (×3): qty 0.4

## 2012-05-23 MED ORDER — VERAPAMIL HCL 2.5 MG/ML IV SOLN
INTRAVENOUS | Status: AC
  Filled 2012-05-23: qty 2

## 2012-05-23 MED ORDER — MORPHINE SULFATE 2 MG/ML IJ SOLN
INTRAMUSCULAR | Status: AC
  Administered 2012-05-23: 2 mg via INTRAVENOUS
  Filled 2012-05-23: qty 1

## 2012-05-23 MED ORDER — SODIUM CHLORIDE 0.9 % IV SOLN: 250.0000 mL | INTRAVENOUS | Status: DC | PRN

## 2012-05-23 MED ORDER — NITROGLYCERIN IN D5W 200-5 MCG/ML-% IV SOLN
INTRAVENOUS | Status: AC
  Administered 2012-05-23: 10 ug/min via INTRAVENOUS
  Filled 2012-05-23: qty 250

## 2012-05-23 MED ORDER — SODIUM CHLORIDE 0.9 % IV SOLN: 1.0000 mL/kg/h | INTRAVENOUS | Status: AC

## 2012-05-23 MED ORDER — ASPIRIN 81 MG PO CHEW: 81.0000 mg | CHEWABLE_TABLET | Freq: Every day | ORAL | Status: DC

## 2012-05-23 MED ORDER — HYDROMORPHONE HCL PF 2 MG/ML IJ SOLN
INTRAMUSCULAR | Status: AC
  Filled 2012-05-23: qty 0.5

## 2012-05-23 MED ORDER — SODIUM CHLORIDE 0.9 % IV SOLN: 250.0000 mL | INTRAVENOUS | Status: DC

## 2012-05-23 MED ORDER — ADULT MULTIVITAMIN W/MINERALS CH
1.0000 | ORAL_TABLET | Freq: Every day | ORAL | Status: DC
  Administered 2012-05-24 – 2012-05-25 (×2): 1 via ORAL
  Filled 2012-05-23 (×3): qty 1

## 2012-05-23 MED ORDER — ZOLPIDEM TARTRATE 5 MG PO TABS: 10.0000 mg | ORAL_TABLET | Freq: Every evening | ORAL | Status: DC | PRN

## 2012-05-23 MED ORDER — NITROGLYCERIN 0.2 MG/ML ON CALL CATH LAB
INTRAVENOUS | Status: AC
  Filled 2012-05-23: qty 1

## 2012-05-23 MED ORDER — PRASUGREL HCL 10 MG PO TABS
10.0000 mg | ORAL_TABLET | Freq: Every day | ORAL | Status: DC
  Administered 2012-05-24 – 2012-05-25 (×2): 10 mg via ORAL
  Filled 2012-05-23 (×2): qty 1

## 2012-05-23 MED ORDER — HEPARIN (PORCINE) IN NACL 100-0.45 UNIT/ML-% IJ SOLN
INTRAMUSCULAR | Status: AC
  Administered 2012-05-23: 25000 [IU] via INTRAVENOUS
  Filled 2012-05-23: qty 250

## 2012-05-23 MED ORDER — FENTANYL CITRATE 0.05 MG/ML IJ SOLN
INTRAMUSCULAR | Status: AC
  Filled 2012-05-23: qty 2

## 2012-05-23 MED ORDER — PRASUGREL HCL 10 MG PO TABS
ORAL_TABLET | ORAL | Status: AC
  Filled 2012-05-23: qty 6

## 2012-05-23 MED ORDER — ASPIRIN EC 81 MG PO TBEC
81.0000 mg | DELAYED_RELEASE_TABLET | Freq: Every day | ORAL | Status: DC
  Administered 2012-05-24 – 2012-05-25 (×2): 81 mg via ORAL
  Filled 2012-05-23 (×3): qty 1

## 2012-05-23 MED ORDER — MORPHINE SULFATE 2 MG/ML IJ SOLN
2.0000 mg | INTRAMUSCULAR | Status: DC | PRN
  Administered 2012-05-23: 2 mg via INTRAVENOUS
  Filled 2012-05-23: qty 1

## 2012-05-23 MED ORDER — ATORVASTATIN CALCIUM 40 MG PO TABS
40.0000 mg | ORAL_TABLET | Freq: Every day | ORAL | Status: DC
  Administered 2012-05-23 – 2012-05-24 (×2): 40 mg via ORAL
  Filled 2012-05-23 (×3): qty 1

## 2012-05-23 MED ORDER — ALBUTEROL SULFATE HFA 108 (90 BASE) MCG/ACT IN AERS
1.0000 | INHALATION_SPRAY | RESPIRATORY_TRACT | Status: DC | PRN
  Filled 2012-05-23: qty 6.7

## 2012-05-23 MED ORDER — OXYCODONE-ACETAMINOPHEN 5-325 MG PO TABS: 1.0000 | ORAL_TABLET | ORAL | Status: DC | PRN

## 2012-05-23 MED ORDER — SPIRONOLACTONE 25 MG PO TABS
25.0000 mg | ORAL_TABLET | Freq: Every day | ORAL | Status: DC
  Administered 2012-05-23 – 2012-05-25 (×3): 25 mg via ORAL
  Filled 2012-05-23 (×3): qty 1

## 2012-05-23 MED ORDER — LORAZEPAM 1 MG PO TABS: 1.0000 mg | ORAL_TABLET | Freq: Four times a day (QID) | ORAL | Status: DC | PRN

## 2012-05-23 MED ORDER — OMEGA-3-ACID ETHYL ESTERS 1 G PO CAPS
1.0000 g | ORAL_CAPSULE | Freq: Every day | ORAL | Status: DC
  Administered 2012-05-23 – 2012-05-25 (×3): 1 g via ORAL
  Filled 2012-05-23 (×3): qty 1

## 2012-05-23 MED ORDER — THIAMINE HCL 100 MG/ML IJ SOLN
100.0000 mg | Freq: Every day | INTRAMUSCULAR | Status: DC
  Filled 2012-05-23 (×3): qty 1

## 2012-05-23 MED ORDER — HEPARIN (PORCINE) IN NACL 100-0.45 UNIT/ML-% IJ SOLN
1250.0000 [IU]/h | INTRAMUSCULAR | Status: DC
  Administered 2012-05-23: 25000 [IU] via INTRAVENOUS
  Filled 2012-05-23: qty 250

## 2012-05-23 MED ORDER — LORAZEPAM 2 MG/ML IJ SOLN: 1.0000 mg | Freq: Four times a day (QID) | INTRAMUSCULAR | Status: DC | PRN

## 2012-05-23 MED ORDER — SODIUM CHLORIDE 0.9 % IJ SOLN
3.0000 mL | Freq: Two times a day (BID) | INTRAMUSCULAR | Status: DC
  Administered 2012-05-24: 6 mL via INTRAVENOUS
  Administered 2012-05-24 – 2012-05-25 (×2): 3 mL via INTRAVENOUS

## 2012-05-23 MED ORDER — NITROGLYCERIN IN D5W 200-5 MCG/ML-% IV SOLN
2.0000 ug/min | INTRAVENOUS | Status: DC
  Administered 2012-05-23: 10 ug/min via INTRAVENOUS

## 2012-05-23 NOTE — H&P (Signed)
History and Physical   Cobb ID: Antonio Cobb MRN: [id number], DOB/AGE: August 09, 1961   Admit date: 05/23/2012 Date of Consult: 05/23/2012   Primary Physician: Syliva Overman, MD, MD Primary Cardiologist: Dietrich Pates, MD  Pt. Profile: Antonio Cobb is a 51yo AA male with PMHx significant for CAD (nonobstructive per cath in 2009), NICM (LVEF 45% without WMAs on 2009 cath, felt to be secondary to longstanding, poorly controlled HTN), type 2 DM, hyperlipidemia, hypertension, obesity, tobacco and alcohol abuse who was transferred from [facility] hospital to [facility] for STEMI and emergent cardiac catheterization.   Problem List: Past Medical History  Diagnosis Date  . Alcohol use   . Obesity   . Nicotine addiction   . Hyperlipidemia   . Diabetes mellitus, type 2   . Hypertension   . Other and unspecified alcohol dependence, unspecified drinking behavior   . Elevated cholesterol     Past Surgical History  Procedure Date  . None   . Cardiac catheterization 2 yrs ago  . Polypectomy 10/16/2011    Procedure: POLYPECTOMY;  Surgeon: Arlyce Harman, MD;  Location: [facility];  Service: Endoscopy;;  Polypoid Lesion, Transverse and Sigmoid Colon     Allergies: No Known Allergies  PAST CARDIAC HISTORY  Cardiac catheterization 2009- 25% distal left main, LAD with ostial calcification, 25-30% diffuse plaque, 40% mid stenosis, small D1, D2 small with 25% stenosis, LCx with diffuse 25-30% narrowing, OM2 25-30%, RCA dominant with long 50% stenosis, LVEF 45%.   diffuse nonobstructive CAD, mild-moderate LV dysfunction (LVEF 45%) felt to be nonischemic without regional WMAs, no obstructive RAS to explain hypertension   HPI:   Antonio Cobb reports experiencing intermittent, "squeezing, burning" substernal chest pain with bilateral arm radiation occurring last night, lasting 10-30 minutes, rated at a 6/10 and associated with diaphoresis. Antonio Cobb denies alleviating factors. Antonio Cobb originally  thought this to be heart burn, however Antonio pain persisted and worsened this AM. Antonio Cobb was at work, bent down and experienced constant, more severe chest discomfort prompting his presentation to [facility] ED. Antonio Cobb has never experienced this pain in Antonio past. Antonio Cobb reports medication compliance.   Upon ED arrival, EKG revealed inferolateral ST elevations. POC troponin-I 0.00. CBC WNL. aPTT/PT/INR WNL. Antonio Cobb received a full-dose ASA and was started on heparin. Antonio Cobb was given morphine IV for pain with some improvement. Antonio Cobb was subsequently transferred to [facility] via EMS to undergo cardiac catheterization.   Antonio Cobb arrived, A&O x 4 with persistent chest pain. Antonio Cobb was briefly interviewed, informed, consented and prepped for cardiac catheterization.   Home Medications: Prior to Admission medications   Medication Sig Start Date End Date Taking? Authorizing Provider  albuterol (PROVENTIL HFA) 108 (90 BASE) MCG/ACT inhaler Inhale 2 puffs into Antonio lungs every 4 (four) hours as needed. EVERY 6-8 HOURS AS NEEDED    Historical Provider, MD  amLODipine (NORVASC) 10 MG tablet Take 1 tablet (10 mg total) by mouth daily. 05/10/12   Kerri Perches, MD  aspirin (ASPIRIN ADULT LOW STRENGTH) 81 MG EC tablet Take 81 mg by mouth daily.      Historical Provider, MD  atorvastatin (LIPITOR) 40 MG tablet Take 1 tablet (40 mg total) by mouth daily. 05/19/12   Kerri Perches, MD  Garlic 500 MG CAPS Take by mouth.      Historical Provider, MD  GLIPIZIDE XL 10 MG 24 hr tablet TAKE 1 TABLET EVERY DAY. STOP GLIPIZIDE 5 MG. 03/16/11   Kerri Perches,  MD  hydrochlorothiazide (HYDRODIURIL) 25 MG tablet Take 1 tablet (25 mg total) by mouth daily. 05/10/12   Kerri Perches, MD  irbesartan (AVAPRO) 300 MG tablet Take 1 tablet (300 mg total) by mouth daily. 05/10/12   Kerri Perches, MD  KOMBIGLYZE XR 2.04-999 MG TB24 TAKE 2 TABLETS ONCE DAILY 03/17/12   Kerri Perches, MD  metoprolol succinate (TOPROL-XL) 50 MG  24 hr tablet take 1 tablet by mouth once daily 03/12/12   Kerri Perches, MD  metoprolol succinate (TOPROL-XL) 50 MG 24 hr tablet take 1 tablet by mouth once daily 03/16/12   Kerri Perches, MD  Omega-3 Fatty Acids (FISH OIL) 1000 MG CAPS Take by mouth daily.      Historical Provider, MD  spironolactone (ALDACTONE) 25 MG tablet Take 1 tablet (25 mg total) by mouth daily. 05/10/12   Kerri Perches, MD  tadalafil (CIALIS) 5 MG tablet Take 1 tablet (5 mg total) by mouth daily as needed for erectile dysfunction. 05/10/12 06/09/12  Kerri Perches, MD  vitamin E 200 UNIT capsule Take 200 Units by mouth daily.      Historical Provider, MD    Inpatient Medications:     . aspirin      . heparin  4,000 Units Intravenous Once  . morphine       Prescriptions prior to admission  Medication Sig Dispense Refill  . albuterol (PROVENTIL HFA) 108 (90 BASE) MCG/ACT inhaler Inhale 2 puffs into Antonio lungs every 4 (four) hours as needed. EVERY 6-8 HOURS AS NEEDED      . amLODipine (NORVASC) 10 MG tablet Take 1 tablet (10 mg total) by mouth daily.  30 tablet  3  . aspirin (ASPIRIN ADULT LOW STRENGTH) 81 MG EC tablet Take 81 mg by mouth daily.        [name] atorvastatin (LIPITOR) 40 MG tablet Take 1 tablet (40 mg total) by mouth daily.  30 tablet  5  . Garlic 500 MG CAPS Take by mouth.        [name] GLIPIZIDE XL 10 MG 24 hr tablet TAKE 1 TABLET EVERY DAY. STOP GLIPIZIDE 5 MG.  30 tablet  3  . hydrochlorothiazide (HYDRODIURIL) 25 MG tablet Take 1 tablet (25 mg total) by mouth daily.  90 tablet  0  . irbesartan (AVAPRO) 300 MG tablet Take 1 tablet (300 mg total) by mouth daily.  90 tablet  0  . KOMBIGLYZE XR 2.04-999 MG TB24 TAKE 2 TABLETS ONCE DAILY  60 tablet  3  . metoprolol succinate (TOPROL-XL) 50 MG 24 hr tablet take 1 tablet by mouth once daily  30 tablet  3  . metoprolol succinate (TOPROL-XL) 50 MG 24 hr tablet take 1 tablet by mouth once daily  30 tablet  3  . Omega-3 Fatty Acids (FISH OIL) 1000 MG CAPS  Take by mouth daily.        [name] spironolactone (ALDACTONE) 25 MG tablet Take 1 tablet (25 mg total) by mouth daily.  30 tablet  3  . tadalafil (CIALIS) 5 MG tablet Take 1 tablet (5 mg total) by mouth daily as needed for erectile dysfunction.  30 tablet  0  . vitamin E 200 UNIT capsule Take 200 Units by mouth daily.          Family History  Problem Relation Age of Onset  . Emphysema Mother   . Heart attack Mother   . Hypertension Mother   . Diabetes Mother   . Heart  disease Mother   . COPD Mother   . Emphysema Father   . COPD Father   . Stroke Sister   . Asthma Sister   . Colon cancer Neg Hx   . Anesthesia problems Neg Hx   . Hypotension Neg Hx   . Malignant hyperthermia Neg Hx   . Pseudochol deficiency Neg Hx      History   Social History  . Marital Status: Married    Spouse Name: N/A    Number of Children: N/A  . Years of Education: N/A   Occupational History  . [employer]    Social History Main Topics  . Smoking status: Current Everyday Smoker -- 0.5 packs/day for 30 years    Types: Cigarettes  . Smokeless tobacco: Not on file   Comment: since early 20s  . Alcohol Use: Yes     pint will last 3 days  . Drug Use: No  . Sexually Active: Not on file   Other Topics Concern  . Not on file   Social History Narrative  . No narrative on file    Review of Systems: General: negative for chills, fever, night sweats or weight changes.  Cardiovascular: positive for chest pain, shortness of breath, diaphoresis, negative for dyspnea on exertion, edema, orthopnea, palpitations, paroxysmal nocturnal dyspnea Dermatological: negative for rash Respiratory: negative for cough or wheezing Urologic: negative for hematuria Abdominal: negative for nausea, vomiting, diarrhea, bright red blood per rectum, melena, or hematemesis Neurologic:  negative for visual changes, syncope, or dizziness All other systems reviewed and are otherwise negative except as noted above.  Physical  Exam: Blood pressure 147/96, pulse 73, temperature 97.4 F (36.3 C), temperature source Oral, resp. rate 20, height 5\' 10"  (1.778 m), weight 90.719 kg (200 lb), SpO2 100.00%.    General: Well developed, well nourished, in no acute distress. Head: Normocephalic, atraumatic, sclera non-icteric, no xanthomas, nares are without discharge.  Neck: Negative for carotid bruits. JVD not elevated. Lungs: Clear bilaterally to auscultation without wheezes, rales, or rhonchi. Breathing is unlabored. Heart: RRR with S1 S2. No murmurs, rubs, or gallops appreciated. Abdomen: Soft, non-tender, non-distended with normoactive bowel sounds. No hepatomegaly. No rebound/guarding. No obvious abdominal masses. Msk:  Strength and tone appears normal for age. Extremities: No clubbing, cyanosis or edema.  Distal pedal pulses are 2+ and equal bilaterally. Neuro: Alert and oriented X 3. Moves all extremities spontaneously. Psych:  Responds to questions appropriately with a normal affect.  Labs: Recent Labs  [facility] 05/23/12 0832   WBC 9.3   HGB 15.7   HCT 45.3   MCV 86.0   PLT 243    Lab 05/16/12 1245  NA 135  K 3.5  CL 101  CO2 24  BUN 14  CREATININE 0.98  CALCIUM 9.3  PROT 6.8  BILITOT 1.0  ALKPHOS 74  ALT 31  AST 23  AMYLASE --  LIPASE --  GLUCOSE 129*   Radiology/Studies: No results found.  EKG: NSR, 75 bpm, ST elevations II, III, aVF, V5, V6 (1-2 mm); reciprocal ST depression/TWIs V1-V3  ASSESSMENT AND PLAN:   Mr. Kempner is a 51yo AA male with PMHx significant for CAD (nonobstructive per cath in 2009), NICM (LVEF 45% without WMAs on 2009 cath, felt to be secondary to longstanding, poorly controlled HTN), type 2 DM, hyperlipidemia, hypertension, obesity, tobacco and alcohol abuse who was transferred from [facility] hospital to [facility] for STEMI and emergent cardiac catheterization.   1. ACS/STEMI- angina beginning last night  and worsening into this morning. Cobb has several  cardiac risk factors noted above. EKG in Antonio ED revealed inferolateral ST elevations. Transferred to [facility] hospital from [facility] for emergent cath. Currently undergoing procedure.   - Further recommendations based on interventionalist's findings  - Will continue Toprol-XL; may have room to up-titrate   - Cycle cardiac biomarkers  - Daily EKGs  - BMET now; CBC and BMET daily starting tomorrow  - Heart healthy diet  - Management of risk factors and lifestyle changes imperative   2. NICM- secondary to longstanding, poorly controlled hypertension noted on prior cath in 2009. LVEF 45% at that time.   - Will review LVEF on today's cath   - Continue ARB/BB/spironolactone  - Monitor volume status post-cath   3. Type 2 DM- A1C 8.0 on 05/16/12. Cobb on oral hypoglycemics  - Hold oral agents including Metformin   - Add SSI while inpatient  - Monitor CBGs  4. Hypertension- longstanding and poorly controlled.   - Will monitor this admission with medication additions/adjustments as tolerated   - Continue above antihypertensives; continue HCTZ, Norvasc   5. Hyperlipidemia- lipid panel on 05/16/12 revealed LDL 85, HDL 39, TG 249, TC 174.  - Continue outpatient Lipitor  6. Tobacco abuse- continued tobacco abuse  - Encourage cessation  - Tobacco cessation counseling   7. Alcohol abuse- drinks daily. Noted on PCP note late last month.  - Encourage cessation  - CIWA protocol while inpatient      Signed, R. Hurman Horn, PA-C 05/23/2012, 9:56 AM   Cobb seen, examined. Available data reviewed. Agree with findings, assessment, and plan as outlined by Hurman Horn, PA-C. Pt with acute inferolateral MI and ongoing 10/10 chest pain. Pt independently evaluated and examined. Plan emergency cath and possible PCI. Prior cath films reviewed. Risks and indications of Antonio procedure reviewed with Antonio Cobb and emergency consent obtained.  Tonny Bollman, M.D. 05/23/2012 10:14 PM

## 2012-05-23 NOTE — Progress Notes (Signed)
Called by RN because pt had earlier had chest pain. It resolved with IV morphine. He is currently pain-free but has elevated blood pressure. His ECG shows evolving MI changes but no new ST elevation.  Added IV NTG and PRN IV Lopressor to his meds and will continue to cycle enzymes through tomorrow.

## 2012-05-23 NOTE — ED Notes (Signed)
Pt c/o severe central chest pain with diaphoresis since last night. Denies nausea or shortness of breath. Pt states that the pain is coming and going.

## 2012-05-23 NOTE — CV Procedure (Signed)
   Cardiac Catheterization Procedure Note  Name: Antonio Cobb MRN: [id number] DOB: 10-12-1961  Procedure: Left Heart Cath, Selective Coronary Angiography, LV angiography, PTCA and stenting of the mid left circumflex  Indication: 51 year old gentleman with diabetes, diffuse nonobstructive coronary disease, and tobacco abuse, presenting for emergency cardiac catheterization. The patient presented to the [facility] Emergency Department with an acute inferolateral ST elevation MI.   Procedural Details:  The right wrist was prepped, draped, and anesthetized with 1% lidocaine. Using the modified Seldinger technique, a 6 French sheath was introduced into the right radial artery. 3 mg of verapamil was administered through the sheath, weight-based unfractionated heparin was administered intravenously. Standard Judkins catheters were used for selective coronary angiography and left ventriculography. Catheter exchanges were performed over an exchange length guidewire.  PROCEDURAL FINDINGS Hemodynamics: AO 119/78 LV 121/71   Coronary angiography: Coronary dominance: right  Left mainstem: The left main is patent with diffuse nonobstructive plaque. The distal left main has 30% stenosis.   Left anterior descending (LAD):  the LAD is patent to the LV apex. The mid LAD has 50-60% stenosis at the origin of the second diagonal branch. The first diagonal is very large in caliber and has no significant obstructive disease.   Left circumflex (LCx):  the left circumflex is totally occluded in the mid vessel. There is TIMI 0 flow. Following PCI, there were 2 obtuse marginals and a left posterolateral branch visualized, all of which are patent.   Right coronary artery (RCA): there is a high anterior origin of the RCA. The mid vessel has diffuse plaque estimated at 50-60%. The vessel is dominant. There is diffuse nonobstructive disease throughout. There is a small PDA and small posterolateral branch.  Left  ventriculography: Left ventricular systolic function is  in the low-normal range, LVEF is estimated at 55%, there is hypokinesis of the basal and midinferior wall.   PCI Note:  Following the diagnostic procedure, the decision was made to proceed with PCI. The patient was loaded with Effient 60 mg.  Weight-based bivalirudin was given for anticoagulation. Once a therapeutic ACT was achieved, a 6 [place] XB-LAD guide catheter was inserted.  A Cougar coronary guidewire was used to cross the lesion.  The lesion was predilated with a 2.5x15 mm balloon.  The lesion was then stented with a 3.5x15 mm Resolute drug-eluting stent.   Following PCI, there was 0% residual stenosis and TIMI-3 flow. Final angiography confirmed an excellent result. The patient tolerated the procedure well. There were no immediate procedural complications. A TR band was used for radial hemostasis. The patient was transferred to the post catheterization recovery area for further monitoring.  PCI Data: Vessel - circumflex /Segment - mid  Percent Stenosis (pre)  100  TIMI-flow  0  Stent  3.5 x 15 mm resolute integrity DES Percent Stenosis (post) 0 TIMI-flow (post)  3  Final Conclusions:    1. Total occlusion of the left circumflex with successful primary PCI using a drug-eluting stent platform 2. Diffuse nonobstructive disease of the right coronary artery and LAD 3. Mild left ventricular dysfunction consistent with inferior MI   Recommendations:   transfer to ICU. Post MI medical therapy will be instituted. Tobacco cessation counseling will be done. Anticipate 48 hour hospitalization if his post MI course is uncomplicated.  Tonny Bollman 05/23/2012, 10:02 AM

## 2012-05-23 NOTE — Progress Notes (Signed)
Pt c/o chest tightness/ soreness, nothing like the pain when he came in but still bothersome rating it 2/10. Ekg done, no new changes noted, morphine 2 mg given. On call PA notified, new order received. Will monitor closely.

## 2012-05-23 NOTE — Care Management Note (Addendum)
    Page 1 of 1   05/25/2012     9:58:31 AM   CARE MANAGEMENT NOTE 05/25/2012  Patient:  Antonio Cobb, Antonio Cobb   Account Number:  [id number]  Date Initiated:  05/23/2012  Documentation initiated by:  Junius Creamer  Subjective/Objective Assessment:   adm w mi     Action/Plan:   lives w fam, pcp dr Claris Che simpson   Anticipated DC Date:  05/25/2012   Anticipated DC Plan:  HOME/SELF CARE      DC Planning Services  CM consult      Choice offered to / List presented to:             Status of service:   Medicare Important Message given?   (If response is "NO", the following Medicare IM given date fields will be blank) Date Medicare IM given:   Date Additional Medicare IM given:    Discharge Disposition:  HOME/SELF CARE  Per UR Regulation:  Reviewed for med. necessity/level of care/duration of stay  If discussed at Long Length of Stay Meetings, dates discussed:    Comments:  6/12 10am debbie Remingtyn Depaola rn,bsn gave pt effient card for 30days free. gave pt copay card to see if it will dec copay. his copay is 39.43 per cm sec.  6/10 11:10A debbie Jaide Hillenburg rn,bsn [phone] have asked cm sec to ck on copay for effient 10mg . 39.47 for effient, no rior auth req.

## 2012-05-23 NOTE — Progress Notes (Signed)
ANTICOAGULATION CONSULT NOTE - Initial Consult  Pharmacy Consult for Heparin Indication: chest pain/ACS  No Known Allergies  Patient Measurements: Height: 5\' 10"  (177.8 cm) Weight: 200 lb (90.719 kg) IBW/kg (Calculated) : 73  Heparin Dosing Weight: 90.7  Vital Signs: Temp: 97.4 F (36.3 C) (06/10 0812) Temp src: Oral (06/10 0812) BP: 147/96 mmHg (06/10 0812) Pulse Rate: 73  (06/10 0812)  Labs: No results found for this basename: HGB:2,HCT:3,PLT:3,APTT:3,LABPROT:3,INR:3,HEPARINUNFRC:3,CREATININE:3,CKTOTAL:3,CKMB:3,TROPONINI:3 in the last 72 hours  Estimated Creatinine Clearance: 101 ml/min (by C-G formula based on Cr of 0.98).   Medical History: Past Medical History  Diagnosis Date  . Alcohol use   . Obesity   . Nicotine addiction   . Hyperlipidemia   . Diabetes mellitus, type 2   . Hypertension   . Other and unspecified alcohol dependence, unspecified drinking behavior   . Elevated cholesterol     Medications:  Infusions:    . heparin 25,000 Units (05/23/12 1308)    (Not in a hospital admission)  Assessment: Okay for Protocol Labs pending Tx to [facility]  Goal of Therapy:  Heparin level 0.3-0.7 units/ml Monitor platelets by anticoagulation protocol: Yes   Plan:  Give 4000 units bolus x 1 Start heparin infusion at 1250 units/hr Check anti-Xa level in 6 hours and daily while on heparin Continue to monitor H&H and platelets  Mady Gemma 05/23/2012,8:41 AM

## 2012-05-23 NOTE — ED Provider Notes (Signed)
History  This chart was scribed for Antonio Baker, MD by Bennett Scrape. This patient was seen in room APA06/[facility]06 and the patient's care was started at 8:13AM.  CSN: [id number]  Arrival date & time 05/23/12  0808   First MD Initiated Contact with Patient 05/23/12 0813      Chief Complaint  Patient presents with  . Chest Pain    The history is provided by the patient. No language interpreter was used.    RAMEY SCHIFF is a 51 y.o. male who presents to the Emergency Department complaining of gradual onset, gradually worsening, intermittently felt chest pain with associated diaphoresis that started last night. Pt reports that the episodes have lasted between 10 minutes to 30 minutes. He rates his pain a 6 out of 10 currently. He reports that squatting down worsens the pain and drinking water improves his pain. He denies SOB. He denies prior episodes. He denies heart related medical conditions. He reports that he had a stress test several years ago. He has a h/o HTN, HLD and DM.He is a current smoker but denies alcohol use.  Dr. Okey Dupre was Cardiologist.  Past Medical History  Diagnosis Date  . Alcohol use   . Obesity   . Nicotine addiction   . Hyperlipidemia   . Diabetes mellitus, type 2   . Hypertension   . Other and unspecified alcohol dependence, unspecified drinking behavior     Past Surgical History  Procedure Date  . None   . Cardiac catheterization 2 yrs ago  . Polypectomy 10/16/2011    Procedure: POLYPECTOMY;  Surgeon: Arlyce Harman, MD;  Location: [facility];  Service: Endoscopy;;  Polypoid Lesion, Transverse and Sigmoid Colon    Family History  Problem Relation Age of Onset  . Emphysema Mother   . Heart attack Mother   . Hypertension Mother   . Diabetes Mother   . Heart disease Mother   . COPD Mother   . Emphysema Father   . COPD Father   . Stroke Sister   . Asthma Sister   . Colon cancer Neg Hx   . Anesthesia problems Neg Hx   . Hypotension Neg Hx   .  Malignant hyperthermia Neg Hx   . Pseudochol deficiency Neg Hx     History  Substance Use Topics  . Smoking status: Current Everyday Smoker -- 0.5 packs/day for 30 years    Types: Cigarettes  . Smokeless tobacco: Not on file   Comment: since early 20s  . Alcohol Use: Yes     pint will last 3 days      Review of Systems  Constitutional:       10 Systems reviewed and are negative for acute change except as noted in the HPI.  HENT: Negative for rhinorrhea and neck pain.   Eyes: Negative for discharge and redness.  Gastrointestinal: Negative for diarrhea.  Genitourinary: Negative for dysuria.  Musculoskeletal: Negative for back pain.  Skin: Negative for rash.    Allergies  Review of patient's allergies indicates no known allergies.  Home Medications   Current Outpatient Rx  Name Route Sig Dispense Refill  . ALBUTEROL SULFATE HFA 108 (90 BASE) MCG/ACT IN AERS Inhalation Inhale 2 puffs into the lungs every 4 (four) hours as needed. EVERY 6-8 HOURS AS NEEDED    . AMLODIPINE BESYLATE 10 MG PO TABS Oral Take 1 tablet (10 mg total) by mouth daily. 30 tablet 3    Refill Approved  . ASPIRIN 81 MG  PO TBEC Oral Take 81 mg by mouth daily.      . ATORVASTATIN CALCIUM 40 MG PO TABS Oral Take 1 tablet (40 mg total) by mouth daily. 30 tablet 5  . GARLIC 500 MG PO CAPS Oral Take by mouth.      [name] GLIPIZIDE XL 10 MG PO TB24  TAKE 1 TABLET EVERY DAY. STOP GLIPIZIDE 5 MG. 30 tablet 3  . HYDROCHLOROTHIAZIDE 25 MG PO TABS Oral Take 1 tablet (25 mg total) by mouth daily. 90 tablet 0  . IRBESARTAN 300 MG PO TABS Oral Take 1 tablet (300 mg total) by mouth daily. 90 tablet 0  . KOMBIGLYZE XR 2.04-999 MG PO TB24  TAKE 2 TABLETS ONCE DAILY 60 tablet 3  . METOPROLOL SUCCINATE ER 50 MG PO TB24  take 1 tablet by mouth once daily 30 tablet 3  . METOPROLOL SUCCINATE ER 50 MG PO TB24  take 1 tablet by mouth once daily 30 tablet 3  . FISH OIL 1000 MG PO CAPS Oral Take by mouth daily.      [name] SPIRONOLACTONE 25  MG PO TABS Oral Take 1 tablet (25 mg total) by mouth daily. 30 tablet 3  . TADALAFIL 5 MG PO TABS Oral Take 1 tablet (5 mg total) by mouth daily as needed for erectile dysfunction. 30 tablet 0  . VITAMIN E 200 UNITS PO CAPS Oral Take 200 Units by mouth daily.        There were no vitals taken for this visit.  Physical Exam  Nursing note and vitals reviewed. Constitutional: He is oriented to person, place, and time. He appears well-developed and well-nourished. No distress.  HENT:  Head: Normocephalic and atraumatic.  Eyes: Conjunctivae and EOM are normal.  Neck: Neck supple. No tracheal deviation present.  Cardiovascular: Normal rate and regular rhythm.   Pulmonary/Chest: Effort normal and breath sounds normal. No respiratory distress.  Abdominal: He exhibits no distension.  Musculoskeletal: Normal range of motion.  Neurological: He is alert and oriented to person, place, and time. No sensory deficit.  Skin: Skin is dry.  Psychiatric: He has a normal mood and affect. His behavior is normal.    ED Course  Procedures (including critical care time)  DIAGNOSTIC STUDIES: Oxygen Saturation is 100% on RA, normal by my interpretation.    COORDINATION OF CARE: 8:16AM-Informed pt that EKG shows he is having a MI. Discussed treatment plan including ASA, heparin and transport to [facility] with pt and pt agreed to plan.     Labs Reviewed - No data to display No results found.   No diagnosis found.    MDM   Date: 05/23/2012  Rate: 75  Rhythm: normal sinus rhythm  QRS Axis: normal  Intervals: normal  ST/T Wave abnormalities: acute myocardial infarction  Conduction Disutrbances:none  Narrative Interpretation:   Old EKG Reviewed: none available    I personally performed the services described in this documentation, which was scribed in my presence. The recorded information has been reviewed and considered.  8:22 AM Spoke with cardiologist at [facility]. Patient be  transported by [facility] ems-patient given aspirin and heparin here         Antonio Baker, MD 05/23/12 [phone]

## 2012-05-24 LAB — BASIC METABOLIC PANEL
BUN: 14 mg/dL (ref 6–23)
CO2: 24 mEq/L (ref 19–32)
Calcium: 9.6 mg/dL (ref 8.4–10.5)
Chloride: 94 mEq/L — ABNORMAL LOW (ref 96–112)
Creatinine, Ser: 0.82 mg/dL (ref 0.50–1.35)
Glucose, Bld: 220 mg/dL — ABNORMAL HIGH (ref 70–99)

## 2012-05-24 LAB — CBC
HCT: 46.2 % (ref 39.0–52.0)
MCH: 30 pg (ref 26.0–34.0)
MCHC: 35.5 g/dL (ref 30.0–36.0)
MCV: 84.6 fL (ref 78.0–100.0)
RDW: 12.2 % (ref 11.5–15.5)

## 2012-05-24 LAB — LIPID PANEL
Cholesterol: 215 mg/dL — ABNORMAL HIGH (ref 0–200)
HDL: 33 mg/dL — ABNORMAL LOW (ref >39)
LDL Cholesterol: UNDETERMINED mg/dL (ref 0–99)
Triglycerides: 437 mg/dL — ABNORMAL HIGH (ref <150)

## 2012-05-24 LAB — GLUCOSE, CAPILLARY
Glucose-Capillary: 232 mg/dL — ABNORMAL HIGH (ref 70–99)
Glucose-Capillary: 274 mg/dL — ABNORMAL HIGH (ref 70–99)

## 2012-05-24 LAB — CARDIAC PANEL(CRET KIN+CKTOT+MB+TROPI)
CK, MB: 41.8 ng/mL (ref 0.3–4.0)
Relative Index: 7.2 — ABNORMAL HIGH (ref 0.0–2.5)
Relative Index: 5.4 — ABNORMAL HIGH (ref 0.0–2.5)
Relative Index: 4.5 — ABNORMAL HIGH (ref 0.0–2.5)
Troponin I: >20 ng/mL (ref <0.30)

## 2012-05-24 MED FILL — Dextrose Inj 5%: INTRAVENOUS | Qty: 50 | Status: AC

## 2012-05-24 NOTE — Progress Notes (Signed)
Inpatient Diabetes Program Recommendations  AACE/ADA: New Consensus Statement on Inpatient Glycemic Control (2009)  Target Ranges:  Prepandial:   less than 140 mg/dL      Peak postprandial:   less than 180 mg/dL (1-2 hours)      Critically ill patients:  140 - 180 mg/dL   Results for EFE, FAZZINO (MRN [id number]) as of 05/24/2012 11:06  Ref. Range 05/23/2012 10:13 05/23/2012 17:45 05/23/2012 21:29 05/24/2012 07:28  Glucose-Capillary Latest Range: 70-99 mg/dL 409 (H) 811 (H) 914 (H) 269 (H)     Inpatient Diabetes Program Recommendations Insulin - Basal: Needs Lantus dose as soon as possible  Thank you  Piedad Climes [facility] Inpatient Diabetes Coordinator [phone]

## 2012-05-24 NOTE — Progress Notes (Signed)
    Subjective:  One episode of chest pain last night. Feels well this am. No further chest pain or dyspnea.  Objective:  Vital Signs in the last 24 hours: Temp:  [97.8 F (36.6 C)-98.8 F (37.1 C)] 98.5 F (36.9 C) (06/11 0800) Pulse Rate:  [69-97] 97  (06/11 0800) Resp:  [13-20] 16  (06/11 0000) BP: (108-168)/(52-98) 108/85 mmHg (06/11 0800) SpO2:  [93 %-99 %] 93 % (06/11 0800) Weight:  [87.5 kg (192 lb 14.4 oz)] 87.5 kg (192 lb 14.4 oz) (06/11 0431)  Intake/Output from previous day: 06/10 0701 - 06/11 0700 In: 611.6 [P.O.:480; I.V.:131.6] Out: 2525 [Urine:2525]  Physical Exam: Pt is alert and oriented, NAD HEENT: normal Neck: JVP - normal, carotids 2+= without bruits Lungs: CTA bilaterally CV: RRR without murmur or gallop Abd: soft, NT, Positive BS, no hepatomegaly Ext: no C/C/E, distal pulses intact and equal Skin: warm/dry no rash   Lab Results:  Basename 05/24/12 0225 05/23/12 1035  WBC 10.7* 7.8  HGB 16.4 16.4  PLT 244 237    Basename 05/24/12 0225 05/23/12 1035  NA 131* 133*  K 3.5 3.7  CL 94* 95*  CO2 24 23  GLUCOSE 220* 234*  BUN 14 17  CREATININE 0.82 0.93    Basename 05/24/12 0225 05/23/12 1800  TROPONINI >25.00* >25.00*   Tele: sinus rhythm, no arrhythmia  Assessment/Plan:  1. Inferolateral STEMI - total circumflex occlusion, treated with primary PCI using a DES platform. Continue ASA and effient for at least 12 months. Secondary risk reduction measures to include beta-blocker, ACE, and statin.  2. Tobacco - cessation counseling done - pt is confident he can quit. Also discussed the need to reduce ETOH intake.  3. HTN - Continue Avapro, amlodipine. Increase Toprol XL to BID dosing.  4. Dispo - wean IV NTG today, tx to stepdown bed. If stable tomorrow plan d/c home. Cardiac rehab phase 1 today.  Tonny Bollman, M.D. 05/24/2012, 8:36 AM

## 2012-05-24 NOTE — Progress Notes (Signed)
CARDIAC REHAB PHASE I   PRE:  Rate/Rhythm: 92 SR    BP: sitting 108/76    SaO2:   MODE:  Ambulation: 350 ft   POST:  Rate/Rhythm: 92 SR    BP: sitting 111/81     SaO2:   Tolerated well. Sts he can tell he has had an MI, feels tired in general. No CP/SOB. Began ed but will finish tomorrow.  [id number]  Harriet Masson CES, ACSM

## 2012-05-24 NOTE — Progress Notes (Signed)
Pt currently smokes 1/2 ppd down from 1 ppd. He is in action stage and very interested in quitting. Plans to quit cold [place] and if unable to contact me for f/u and assistance. Referred to 1-800 quit now for f/u and support. Discussed oral fixation substitutes, second hand smoke and in home smoking policy. Reviewed and gave pt Written education/contact information.

## 2012-05-25 DIAGNOSIS — I219 Acute myocardial infarction, unspecified: Secondary | ICD-10-CM

## 2012-05-25 LAB — BASIC METABOLIC PANEL
CO2: 23 mEq/L (ref 19–32)
Chloride: 94 mEq/L — ABNORMAL LOW (ref 96–112)
Creatinine, Ser: 1.17 mg/dL (ref 0.50–1.35)
GFR calc Af Amer: 82 mL/min — ABNORMAL LOW (ref >90)
Potassium: 3.6 mEq/L (ref 3.5–5.1)
Sodium: 132 mEq/L — ABNORMAL LOW (ref 135–145)

## 2012-05-25 LAB — CARDIAC PANEL(CRET KIN+CKTOT+MB+TROPI)
CK, MB: 13.1 ng/mL (ref 0.3–4.0)
Relative Index: 2.5 (ref 0.0–2.5)
Total CK: 615 U/L — ABNORMAL HIGH (ref 7–232)
Total CK: 523 U/L — ABNORMAL HIGH (ref 7–232)
Troponin I: 17.61 ng/mL (ref <0.30)

## 2012-05-25 MED ORDER — METOPROLOL SUCCINATE ER 50 MG PO TB24
50.0000 mg | ORAL_TABLET | Freq: Two times a day (BID) | ORAL | Status: DC
  Administered 2012-05-25: 50 mg via ORAL
  Filled 2012-05-25 (×2): qty 1

## 2012-05-25 MED ORDER — METOPROLOL SUCCINATE ER 50 MG PO TB24: 50.0000 mg | ORAL_TABLET | Freq: Two times a day (BID) | ORAL | Status: DC

## 2012-05-25 MED ORDER — PRASUGREL HCL 10 MG PO TABS: 10.0000 mg | ORAL_TABLET | Freq: Every day | ORAL | Status: DC

## 2012-05-25 MED ORDER — ATORVASTATIN CALCIUM 40 MG PO TABS: 40.0000 mg | ORAL_TABLET | Freq: Every day | ORAL | Status: DC

## 2012-05-25 MED ORDER — INSULIN GLARGINE 100 UNIT/ML ~~LOC~~ SOLN: 30.0000 [IU] | Freq: Every day | SUBCUTANEOUS | Status: DC

## 2012-05-25 MED ORDER — NITROGLYCERIN 0.4 MG SL SUBL: 0.4000 mg | SUBLINGUAL_TABLET | SUBLINGUAL | Status: DC | PRN

## 2012-05-25 MED ORDER — SAXAGLIPTIN-METFORMIN ER 2.5-1000 MG PO TB24: 2.0000 | ORAL_TABLET | Freq: Every day | ORAL | Status: DC

## 2012-05-25 NOTE — Discharge Instructions (Signed)
PLEASE REMEMBER TO BRING ALL OF YOUR MEDICATIONS TO EACH OF YOUR FOLLOW-UP OFFICE VISITS.  PLEASE TAKE ALL NEW MEDICATIONS/MEDICATION CHANGES AS PRESCRIBED.  PLEASE ATTEND ALL SCHEDULED FOLLOW-UP APPOINTMENTS. PLEASE FOLLOW-UP WITH CARDIAC REHABILITATION IN [place], [place].   Activity: Increase activity slowly as tolerated. You may shower, but no soaking baths for 6 days. No driving for 2 weeks. No lifting over 5 lbs for 4 weeks. No sexual activity for 4 weeks.   You May Return to Work: in 2 weeks (as recommended after follow-up appointment)  Wound Care: You may wash cath site gently with soap and water. Keep cath site clean and dry. If you notice pain, swelling, bleeding or pus at your cath site, please call/return [phone].   Myocardial Infarction A myocardial infarction (MI) is damage to the heart that is not reversible. It is also called a heart attack. An MI usually occurs when a heart (coronary) artery becomes blocked or narrowed. This cuts off the blood supply to the heart. When one or more of the heart (coronary) arteries becomes blocked, that area of the heart begins to die. This causes pain felt during an MI.  If you think you might be having an MI, call your local emergency services immediately (911 in [place] It is recommended that you take a 162 mg non-enteric coated aspirin if you do not have an aspirin allergy. Do not drive yourself to the hospital or wait to see if your symptoms go away. The sooner MI is treated, the greater the amount of heart muscle saved. Time is muscle. It can save your life. CAUSES  An MI can occur from:  A gradual buildup of a fatty substance called plaque. When plaque builds up in the arteries, this condition is called atherosclerosis. This buildup can block or reduce the blood supply to the heart artery(s).   A sudden plaque rupture within a heart artery that causes a blood clot (thrombus). A blood clot can block the heart artery which does not allow blood  flow to the heart.   A severe tightening (spasm) of the heart artery. This is a less common cause of a heart attack. When a heart artery spasms, it cuts off blood flow through the artery. Spasms can occur in heart arteries that do not have atherosclerosis.  RISK FACTORS People at risk for an MI usually have one or more risk factors, such as:  High blood pressure.   High cholesterol.   Smoking.   Gender. Men have a higher heart attack risk.   Overweight/obesity.   Age.   Family history.   Lack of exercise.   Diabetes.   Stress.   Excessive alcohol use.   Street drug use (cocaine and methamphetamines).  SYMPTOMS  MI symptoms can vary, such as:  In both men and women, MI symptoms can include the following:   Chest pain. The chest pain may feel like a crushing, squeezing, or "pressure" type feeling. MI pain can be "referred," meaning pain can be caused in one part of the body but felt in another part of the body. Referred MI pain may occur in the left arm, neck, or jaw. Pain may even be felt in the right arm.   Shortness of breath (dyspnea).   Heartburn or indigestion with or without vomiting, shortness of breath, or sweating (diaphoresis).   Sudden, cold sweats.   Sudden lightheadedness.   Upper back pain.   Women can have unique MI symptoms, such as:   Unexplained feelings of nervousness  or anxiety.   Discomfort between the shoulder blades (scapula) or upper back.   Tingling in the hands and arms.   In elderly people (regardless of gender), MI symptoms can be subtle, such as:   Sweating (diaphoresis).   Shortness of breath (dyspnea).   General tiredness (fatigue) or not feeling well (malaise).  DIAGNOSIS  Diagnosis of an MI involves several tests such as:  An assessment of your vital signs such as heart rhythm, blood pressure, respiratory rate, and oxygen level.   An EKG (ECG) to look at the electrical activity of your heart.   Blood tests called  cardiac markers are drawn at scheduled times to measure proteins or enzymes released by the damaged heart muscle.   A chest X-ray.   An echocardiogram to evaluate heart motion and blood flow.   Coronary angiography (cardiac catheterization). This is a diagnostic procedure to look at the heart arteries.  TREATMENT  Acute Intervention. For an MI, the national standard in the [place] is to have an acute intervention in under 90 minutes from the time you get to the hospital. An acute intervention is a special procedure to open up the heart arteries. It is done in a treatment room called a "catheterization lab" (cath lab). Some hospitals do no have a cath lab. If you are having an MI and the hospital does not have a cath lab, the standard is to transport you to a hospital that has one. In the cath lab, acute intervention includes:  Angioplasty. An angioplasty involves inserting a thin, flexible tube (catheter) into an artery in either your groin or wrist. The catheter is threaded to the heart arteries. A balloon at the end of the catheter is inflated to open a narrowed or blocked heart artery. During an angioplasty procedure, a small mesh tube (stent) may be used to keep the heart artery open. Depending on your condition and health history, one of two types of stents may be placed:   Drug-eluting stent (DES). A DES is coated with a medicine to prevent scar tissue from growing over the stent. With drug-eluting stents, blood thinning medicine will need to be taken for up to a year.   Bare metal stent. This type of stent has no special coating to keep tissue from growing over it. This type of stent is used if you cannot take blood thinning medicine for a prolonged time or you need surgery in the near future. After a bare metal stent is placed, blood thinning medicine will need to be taken for about a month.   If you are taking blood thinning medicine (anti-platelet therapy) after stent placement, do  not stop taking it unless your caregiver says it is okay to do so. Make sure you understand how long you need to take the medicine.  Surgical Intervention  If an acute intervention is not successful, surgery may be needed:   Open heart surgery (coronary artery bypass graft, CABG). CABG takes a vein (saphenous vein) from your leg. The vein is then attached to the blocked heart artery which bypasses the blockage. This then allows blood flow to the heart muscle.  Additional Interventions  A "clot buster" medicine (thrombolytic) may be given. This medicine can help break up a clot in the heart artery. This medicine may be given if a person cannot get to a cath lab right away.   Intra-aortic balloon pump (IABP). If you have suffered a very severe MI and are too unstable to go to the  cath lab or to surgery, an IABP may be used. This is a temporary mechanical device used to increase blood flow to the heart and reduce the workload of the heart until you are stable enough to go to the cath lab or surgery.  HOME CARE INSTRUCTIONS After an MI, you may need the following:  Medication. Take medication as directed by your caregiver. Medications after an MI may:   Keep your blood from clotting easily (blood thinners).   Control your blood pressure.   Help lower your cholesterol.   Control abnormal heart rhythms.   Lifestyle changes. Under the guidance of your caregiver, lifestyle changes include:   Quitting smoking, if you smoke. Your caregiver can help you quit.   Being physically active.   Maintaining a healthy weight.   Eating a heart healthy diet. A dietician can help you learn healthy eating options.   Managing diabetes.   Reducing stress.   Limiting alcohol intake.  SEEK IMMEDIATE MEDICAL CARE IF:   You have severe chest pain, especially if the pain is crushing or pressure-like and spreads to the arms, back, neck, or jaw. This is an emergency. Do not wait to see if the pain will go  away. Get medical help at once. Call your local emergency services (911 in the [place] Do not drive yourself to the hospital.   You have shortness of breath during rest, sleep, or with activity.   You have sudden sweating or clammy skin.   You feel sick to your stomach (nauseous) and throw up (vomit).   You suddenly become lightheaded or dizzy.   You feel your heart beating rapidly or you notice "skipped" beats.  MAKE SURE YOU:   Understand these instructions.   Will watch your condition.   Will get help right away if you are not doing well or get worse.  Document Released: 11/30/2005 Document Revised: 11/19/2011 Document Reviewed: 04/29/2011 [facility] Patient Information 2012 [place], [place].

## 2012-05-25 NOTE — Progress Notes (Signed)
Pt walking independently. Sts he is getting used to BP being lower. Very indepth ed completed with pt and wife. Good questions. Wants to quit smoking. Encouraged ETOH cessation or very minimal given trig and DM. Pt seemingly not as committed to this. Declines counseling. Requests his name be sent to [facility]. [phone] Ethelda Chick CES, ACSM

## 2012-05-25 NOTE — Discharge Summary (Signed)
Discharge Summary   Patient ID: Antonio Cobb,  MRN: [id number], DOB/AGE: 1961/11/15 51 y.o.  Admit date: 05/23/2012 Discharge date: 05/25/2012  Discharge Diagnoses Principal Problem:  *STEMI (ST elevation myocardial infarction)  - inferolateral ST elevations on EKG on 05/23/12  - emergent transfer from [facility] hospital to [facility] for cardiac catheterization  - total occlusion of LCx s/p successful DES; nonobstructive CAD in RCA and LAD; EF 55%; basal/midinferior wall hypokinesis  - Ambulated well with cardiac rehab, no further events, will follow-up in [place],   - DAPT- ASA/Effient, ARB, BB, statin, NTG SL PRN  - Follow-up < 7 days in [place] as scheduled below   Active Problems:  DM (diabetes mellitus), type 2, uncontrolled  - SSI on admission  - Re-initiated on home Lantus given morning hyperglycemia 250s  HYPERLIPIDEMIA  - Continued on statin  HYPERTENSION  - Well controlled on outpatient antihypertensives  - Toprol-XL up-titrated to BID dosing  NICM (nonischemic cardiomyopathy)  - LVEF 55% on cath, WMAs noted above  - Euvolemic post-cath  Tobacco abuse  - Underwent tobacco cessation counseling  - Willing to quit without assistance, will call counselor should he request this  Alcohol abuse  - Initiated on CIWA protocol inpatient  - Willing to quit  CAD (coronary artery disease)  - As above  Allergies No Known Allergies  Diagnostic Studies/Procedures  Cardiac catheterization with PCI- 05/23/12  Procedure: Left Heart Cath, Selective Coronary Angiography, LV angiography, PTCA and stenting of the mid left circumflex  Indication: 51 year old gentleman with diabetes, diffuse nonobstructive coronary disease, and tobacco abuse, presenting for emergency cardiac catheterization. The patient presented to the [facility] Emergency Department with an acute inferolateral ST elevation MI.  Procedural Details: The right wrist was prepped, draped, and  anesthetized with 1% lidocaine. Using the modified Seldinger technique, a 6 French sheath was introduced into the right radial artery. 3 mg of verapamil was administered through the sheath, weight-based unfractionated heparin was administered intravenously. Standard Judkins catheters were used for selective coronary angiography and left ventriculography. Catheter exchanges were performed over an exchange length guidewire.  PROCEDURAL FINDINGS  Hemodynamics:  AO 119/78  LV 121/71  Coronary angiography:  Coronary dominance: right  Left mainstem: The left main is patent with diffuse nonobstructive plaque. The distal left main has 30% stenosis.  Left anterior descending (LAD): the LAD is patent to the LV apex. The mid LAD has 50-60% stenosis at the origin of the second diagonal branch. The first diagonal is very large in caliber and has no significant obstructive disease.  Left circumflex (LCx): the left circumflex is totally occluded in the mid vessel. There is TIMI 0 flow. Following PCI, there were 2 obtuse marginals and a left posterolateral branch visualized, all of which are patent.  Right coronary artery (RCA): there is a high anterior origin of the RCA. The mid vessel has diffuse plaque estimated at 50-60%. The vessel is dominant. There is diffuse nonobstructive disease throughout. There is a small PDA and small posterolateral branch.  Left ventriculography: Left ventricular systolic function is in the low-normal range, LVEF is estimated at 55%, there is hypokinesis of the basal and midinferior wall.  PCI Note: Following the diagnostic procedure, the decision was made to proceed with PCI. The patient was loaded with Effient 60 mg. Weight-based bivalirudin was given for anticoagulation. Once a therapeutic ACT was achieved, a 6 [place] XB-LAD guide catheter was inserted. A Cougar coronary guidewire was used to cross the lesion. The lesion was  predilated with a 2.5x15 mm balloon. The lesion was then  stented with a 3.5x15 mm Resolute drug-eluting stent. Following PCI, there was 0% residual stenosis and TIMI-3 flow. Final angiography confirmed an excellent result. The patient tolerated the procedure well. There were no immediate procedural complications. A TR band was used for radial hemostasis. The patient was transferred to the post catheterization recovery area for further monitoring.  PCI Data:  Vessel - circumflex /Segment - mid  Percent Stenosis (pre) 100  TIMI-flow 0  Stent 3.5 x 15 mm resolute integrity DES  Percent Stenosis (post) 0  TIMI-flow (post) 3  Final Conclusions:  1. Total occlusion of the left circumflex with successful primary PCI using a drug-eluting stent platform  2. Diffuse nonobstructive disease of the right coronary artery and LAD  3. Mild left ventricular dysfunction consistent with inferior MI  Recommendations:  transfer to ICU. Post MI medical therapy will be instituted. Tobacco cessation counseling will be done. Anticipate 48 hour hospitalization if his post MI course is uncomplicated.  History of Present Illness  Antonio Cobb is a 51yo AA male with the above problem list who was transferred emergently from [facility] hospital to [facility] with inferolateral STEMI.   Please see H&P for full pre-hospital details. Briefly, he began experiencing intermittent, substernal chest pain the night prior radiating to his arms bilaterally, prompting his presentation to [facility] ED. There, and EKG revealed inferolateral ST elevations. POC troponin-I 0.0. He was subsequently transferred to [facility] via EMS to undergo cardiac catheterization.  Hospital Course   He was informed, consented and underwent the procedure detailed above. He tolerated this well. The recommendation was made for DAPT- ASA/Effient x 1 year. He was transferred to ICU in good condition. Outpatient medications were continued excluding oral hypoglycemics and this was replaced with SSI  while inpatient. Lantus was added given morning hyperglycemia. CIWA protocol was initiated given the patient's alcohol abuse. He underwent tobacco cessation counseling as well, and expressed desire to quit without assistance. Resources were provided for assistance in the future. He ambulated well with cardiac rehab. Access site remained stable. Toprol-XL was up-titrated to BID dosing with good tolerance. There were no acute events post-cath and cardiac biomarkers downtrended appropriately.   Today, he was evaluated by Dr. Excell Seltzer, and found to be stable for discharge. He will follow-up in the [place] office in < 7 days as outlined above. Arrangements were made by case management for Effient assistance, and prescriptions were completed appropriately. FMLA forms were completed to allow abstinence from work for approximately 2 weeks. The patient will be discharged with the below medications. This information, including activity restrictions and supplemental MI material, has been clearly outlined in the discharge AVS.   Discharge Vitals:  Blood pressure 108/85, pulse 97, temperature 97.8 F (36.6 C), temperature source Oral, resp. rate 18, height 5\' 10"  (1.778 m), weight 88.5 kg (195 lb 1.7 oz), SpO2 94.00%.   Weight change: -2.22 kg (-4 lb 14.3 oz)  Labs: Recent Labs  Basename 05/24/12 0225 05/23/12 1035   WBC 10.7* 7.8   HGB 16.4 16.4   HCT 46.2 46.2   MCV 84.6 85.7   PLT 244 237   Lab 05/25/12 0150 05/24/12 0225 05/23/12 1035  NA 132* 131* 133*  K 3.6 3.5 3.7  CL 94* 94* 95*  CO2 23 24 23   BUN 26* 14 17  CREATININE 1.17 0.82 0.93  CALCIUM 9.5 9.6 9.5  PROT -- -- 8.0  BILITOT -- --  0.9  ALKPHOS -- -- 92  ALT -- -- 56*  AST -- -- 221*  AMYLASE -- -- --  LIPASE -- -- --  GLUCOSE 279* 220* 234*   Recent Labs  Basename 05/23/12 1052   HGBA1C 8.3*   Recent Labs  Basename 05/25/12 0959 05/25/12 0150 05/24/12 1740   CKTOTAL 523* 615* 919*   CKMB 13.1* 20.7* 41.8*   CKMBINDEX --  -- --   TROPONINI 17.61* >25.00* >20.00*   Recent Labs  Basename 05/24/12 0225   CHOL 215*   HDL 33*   LDLCALC UNABLE TO CALCULATE IF TRIGLYCERIDE OVER 400 mg/dL   TRIG 161*   CHOLHDL 6.5   LDLDIRECT --   Disposition:  Discharge Orders    Future Appointments: Provider: Department: Dept Phone: Center:   05/31/2012 2:20 PM Jodelle Gross, NP [facility] [phone] [facility]   09/05/2012 4:00 PM Kerri Perches, MD [facility] [phone] [facility]     Future Orders Please Complete By Expires   Amb Referral to Cardiac Rehabilitation        Follow-up Information    Follow up with [facility] on 05/31/2012. (With Joni Reining at 2:20 PM for follow-up after this hospitalization. )    Contact information:   [address] [place] [address]         Discharge Medications:  Medication List  As of 05/25/2012 11:23 AM   START taking these medications         nitroGLYCERIN 0.4 MG SL tablet   Commonly known as: NITROSTAT   Place 1 tablet (0.4 mg total) under the tongue every 5 (five) minutes as needed for chest pain.      prasugrel 10 MG Tabs   Commonly known as: EFFIENT   Take 1 tablet (10 mg total) by mouth daily.         CHANGE how you take these medications         atorvastatin 40 MG tablet   Commonly known as: LIPITOR   Take 1 tablet (40 mg total) by mouth daily at 6 PM.   What changed: how often to take the med      metoprolol succinate 50 MG 24 hr tablet   Commonly known as: TOPROL-XL   Take 1 tablet (50 mg total) by mouth 2 (two) times daily. Take with or immediately following a meal.   What changed: See the new instructions.         CONTINUE taking these medications         albuterol 108 (90 BASE) MCG/ACT inhaler   Commonly known as: PROVENTIL HFA;VENTOLIN HFA      amLODipine 10 MG tablet   Commonly known as: NORVASC   Take 1 tablet (10 mg total) by mouth daily.      ASPIRIN ADULT LOW STRENGTH  81 MG EC tablet   Generic drug: aspirin      Fish Oil 1000 MG Caps      hydrochlorothiazide 25 MG tablet   Commonly known as: HYDRODIURIL   Take 1 tablet (25 mg total) by mouth daily.      irbesartan 300 MG tablet   Commonly known as: AVAPRO   Take 1 tablet (300 mg total) by mouth daily.      LANTUS SOLOSTAR 100 UNIT/ML injection   Generic drug: insulin glargine      Saxagliptin-Metformin 2.04-999 MG Tb24   Take 2 tablets by mouth daily.      spironolactone 25 MG  tablet   Commonly known as: ALDACTONE   Take 1 tablet (25 mg total) by mouth daily.      tadalafil 5 MG tablet   Commonly known as: CIALIS   Take 1 tablet (5 mg total) by mouth daily as needed for erectile dysfunction.      vitamin E 200 UNIT capsule          Where to get your medications    These are the prescriptions that you need to pick up. We sent them to a specific pharmacy, so you will need to go there to get them.   [id number] - [place], [place] - [address]    [address] [place] [address]    Phone: [phone]        atorvastatin 40 MG tablet   metoprolol succinate 50 MG 24 hr tablet   nitroGLYCERIN 0.4 MG SL tablet         You may get these medications from any pharmacy.         prasugrel 10 MG Tabs         Information on where to get these meds is not yet available. Ask your nurse or doctor.         Saxagliptin-Metformin 2.04-999 MG Tb24           Outstanding Labs/Studies: None  Duration of Discharge Encounter: Greater than 30 minutes including physician time.  Signed, R. Hurman Horn, PA-C 05/25/2012, 11:23 AM

## 2012-05-25 NOTE — Progress Notes (Signed)
    Subjective:  Feels good. Walked halls without sx's yesterday. No chest pain or dyspnea.  Objective:  Vital Signs in the last 24 hours: Temp:  [98 F (36.7 C)-98.8 F (37.1 C)] 98.8 F (37.1 C) (06/12 0400) Pulse Rate:  [75-97] 89  (06/11 1600) Resp:  [16-22] 18  (06/12 0400) BP: (86-136)/(59-85) 106/69 mmHg (06/12 0400) SpO2:  [92 %-99 %] 93 % (06/12 0400) Weight:  [88.5 kg (195 lb 1.7 oz)] 88.5 kg (195 lb 1.7 oz) (06/12 0500)  Intake/Output from previous day: 06/11 0701 - 06/12 0700 In: 979 [P.O.:960; I.V.:19] Out: 450 [Urine:450]  Physical Exam: Pt is alert and oriented, NAD HEENT: normal Neck: JVP - normal, carotids 2+= without bruits Lungs: CTA bilaterally CV: RRR without murmur or gallop Abd: soft, NT, Positive BS, no hepatomegaly Ext: no C/C/E, distal pulses intact and equal Skin: warm/dry no rash   Lab Results:  Basename 05/24/12 0225 05/23/12 1035  WBC 10.7* 7.8  HGB 16.4 16.4  PLT 244 237    Basename 05/25/12 0150 05/24/12 0225  NA 132* 131*  K 3.6 3.5  CL 94* 94*  CO2 23 24  GLUCOSE 279* 220*  BUN 26* 14  CREATININE 1.17 0.82    Basename 05/25/12 0150 05/24/12 1740  TROPONINI >25.00* >20.00*   Tele: sinus rhythm  Assessment/Plan:  1. Inferolateral STEMI - s/p PCI of the circumflex. Continue ASA and effient. Follow-up in 2 weeks. Long discussion regarding risk factor modification. His meds have been reviewed and are appropriate (beta-blocker/ARB/statin).  2. Diabetes, Type 2. Resume Lantus 30 units  3. HTN - well-controlled  4. Hyperlipidemia - on statin Rx  5. Dispo - home today. Tobacco cessation done. Follow-up in [place] in 2 weeks. Rtn work 2 weeks after his follow-up OV. Needs FMLA papers filled out.  Tonny Bollman, M.D. 05/25/2012, 6:46 AM

## 2012-05-25 NOTE — Progress Notes (Signed)
Patient discharged to home, patient education completed. Patient verbalizes understanding.

## 2012-05-26 ENCOUNTER — Telehealth: Payer: Self-pay | Admitting: Family Medicine

## 2012-05-26 ENCOUNTER — Encounter: Payer: Self-pay | Admitting: Adult Health

## 2012-05-26 MED ORDER — IRBESARTAN 300 MG PO TABS: 300.0000 mg | ORAL_TABLET | Freq: Every day | ORAL | Status: DC

## 2012-05-26 MED ORDER — GLUCOSE BLOOD VI STRP: ORAL_STRIP | Status: AC

## 2012-05-26 NOTE — Discharge Summary (Signed)
Agree with above. See my progress note the same date.

## 2012-05-26 NOTE — Telephone Encounter (Signed)
Sent in

## 2012-05-31 ENCOUNTER — Ambulatory Visit (INDEPENDENT_AMBULATORY_CARE_PROVIDER_SITE_OTHER): Payer: 59 | Admitting: Adult Health

## 2012-05-31 ENCOUNTER — Encounter: Payer: Self-pay | Admitting: Adult Health

## 2012-05-31 VITALS — BP 111/77 | HR 75 | Resp 16 | Ht 70.0 in | Wt 198.0 lb

## 2012-05-31 DIAGNOSIS — E785 Hyperlipidemia, unspecified: Secondary | ICD-10-CM

## 2012-05-31 DIAGNOSIS — I2510 Atherosclerotic heart disease of native coronary artery without angina pectoris: Secondary | ICD-10-CM

## 2012-05-31 DIAGNOSIS — I10 Essential (primary) hypertension: Secondary | ICD-10-CM

## 2012-05-31 DIAGNOSIS — I214 Non-ST elevation (NSTEMI) myocardial infarction: Secondary | ICD-10-CM

## 2012-05-31 MED ORDER — PANTOPRAZOLE SODIUM 40 MG PO TBEC: 40.0000 mg | DELAYED_RELEASE_TABLET | Freq: Every day | ORAL | Status: DC

## 2012-05-31 NOTE — Assessment & Plan Note (Signed)
He is doing well and is back to his normal energy level. He experienced severe heartburn symptoms with associated pressure at that time. He has had one incidence of GERD symptoms which caused him to take one NTG.   I have prescribed protonix 40 mg daily for GERD symptoms as he is on medications that can exacerbate this. He will continue current medications, especially Effient for at least one year. This has been explained to him. I have referred him to cardiac rehab for assessment, as he work place will not allow him to return to work until he is able to be 100% of capacity.

## 2012-05-31 NOTE — Assessment & Plan Note (Signed)
Continued risk factor modification. He will have lipids checked in 3 months.

## 2012-05-31 NOTE — Assessment & Plan Note (Signed)
Currently very well controlled. No changes in medications.

## 2012-05-31 NOTE — Patient Instructions (Addendum)
Your physician recommends that you schedule a follow-up appointment in: 6 months  Referral made to Cardiac Rehab  Your physician recommends that you return for lab work in:  1 - START Protonix 40 mg daily

## 2012-05-31 NOTE — Progress Notes (Signed)
HPI: [name] is a 51 y/o patient we are following post hospitalization for inferolateral STEMI on 05/23/2012, resulting in DES to the LCx, with nonobstructive CAD in the RCA and LAD. EF of 55%, niow on DAPT with Effient and ASA.Marland KitchenHe also has a history of hypertension, hypercholesterolemia, recent tobacco cessation, and ETOH cessation. He comes today without complaint. He stopped smoking when he was admitted and has not started back. He has some complaints of GERD symptoms which scared him, so he took one NTG.  He has not returned to work and is trying to increase his activity. He is medically compliant. He continues on spironolactone secondary to chronic hypokalemia. He has normal kidney function.  No Known Allergies  Current Outpatient Prescriptions  Medication Sig Dispense Refill  . albuterol (PROVENTIL HFA;VENTOLIN HFA) 108 (90 BASE) MCG/ACT inhaler Inhale 2 puffs into the lungs every 6 (six) hours as needed. For shortness of breath      . amLODipine (NORVASC) 10 MG tablet Take 1 tablet (10 mg total) by mouth daily.  30 tablet  3  . aspirin (ASPIRIN ADULT LOW STRENGTH) 81 MG EC tablet Take 81 mg by mouth daily.        [name] atorvastatin (LIPITOR) 40 MG tablet Take 1 tablet (40 mg total) by mouth daily at 6 PM.  30 tablet  3  . glucose blood test strip Use as instructed for three times daily testing DX 250.00  100 each  12  . hydrochlorothiazide (HYDRODIURIL) 25 MG tablet Take 1 tablet (25 mg total) by mouth daily.  90 tablet  0  . insulin glargine (LANTUS SOLOSTAR) 100 UNIT/ML injection Inject 30 Units into the skin at bedtime.      . irbesartan (AVAPRO) 300 MG tablet Take 1 tablet (300 mg total) by mouth daily.  90 tablet  0  . metoprolol succinate (TOPROL-XL) 50 MG 24 hr tablet Take 1 tablet (50 mg total) by mouth 2 (two) times daily. Take with or immediately following a meal.  60 tablet  3  . nitroGLYCERIN (NITROSTAT) 0.4 MG SL tablet Place 1 tablet (0.4 mg total) under the tongue every 5  (five) minutes as needed for chest pain.  25 tablet  3  . Omega-3 Fatty Acids (FISH OIL) 1000 MG CAPS Take by mouth daily.        . prasugrel (EFFIENT) 10 MG TABS Take 1 tablet (10 mg total) by mouth daily.  30 tablet  0  . Saxagliptin-Metformin 2.04-999 MG TB24 Take 2 tablets by mouth daily.  30 tablet    . spironolactone (ALDACTONE) 25 MG tablet Take 1 tablet (25 mg total) by mouth daily.  30 tablet  3  . tadalafil (CIALIS) 5 MG tablet Take 1 tablet (5 mg total) by mouth daily as needed for erectile dysfunction.  30 tablet  0  . pantoprazole (PROTONIX) 40 MG tablet Take 1 tablet (40 mg total) by mouth daily.  30 tablet  11    Past Medical History  Diagnosis Date  . Alcohol use   . Obesity   . Nicotine addiction   . Hyperlipidemia   . Diabetes mellitus, type 2   . Hypertension   . Other and unspecified alcohol dependence, unspecified drinking behavior   . Elevated cholesterol     Past Surgical History  Procedure Date  . None   . Cardiac catheterization 2 yrs ago  . Polypectomy 10/16/2011    Procedure: POLYPECTOMY;  Surgeon: Arlyce Harman, MD;  Location: [facility];  Service: Endoscopy;;  Polypoid Lesion, Transverse and Sigmoid Colon    WUJ:WJXBJY of systems complete and found to be negative unless listed above  PHYSICAL EXAM BP 111/77  Pulse 75  Resp 16  Ht 5\' 10"  (1.778 m)  Wt 198 lb (89.812 kg)  BMI 28.41 kg/m2  General: Well developed, well nourished, in no acute distress Head: Eyes PERRLA, No xanthomas.   Normal cephalic and atramatic  Lungs: Clear bilaterally to auscultation and percussion. Heart: HRRR S1 S2, without MRG.  Pulses are 2+ & equal.            No carotid bruit. No JVD.  No abdominal bruits. No femoral bruits. Abdomen: Bowel sounds are positive, abdomen soft and non-tender without masses or                  Hernia's noted. Msk:  Back normal, normal gait. Normal strength and tone for age. Extremities: No clubbing, cyanosis or edema. Right wrist site of  catheter insertion is healthy. DP +1 Neuro: Alert and oriented X 3. Psych:  Good affect, responds appropriately   ASSESSMENT AND PLAN

## 2012-06-08 ENCOUNTER — Encounter: Payer: 59 | Admitting: Adult Health

## 2012-07-05 ENCOUNTER — Encounter (HOSPITAL_COMMUNITY): Payer: Self-pay

## 2012-07-05 ENCOUNTER — Encounter (HOSPITAL_COMMUNITY)
Admission: RE | Admit: 2012-07-05 | Discharge: 2012-07-05 | Disposition: A | Discharge status: Home / Self Care | Payer: 59 | Source: Ambulatory Visit | Attending: Cardiology | Admitting: Cardiology

## 2012-07-05 VITALS — BP 120/90 | HR 71 | Ht 70.0 in | Wt 201.7 lb

## 2012-07-05 DIAGNOSIS — Z87891 Personal history of nicotine dependence: Secondary | ICD-10-CM | POA: Insufficient documentation

## 2012-07-05 DIAGNOSIS — Z79899 Other long term (current) drug therapy: Secondary | ICD-10-CM | POA: Insufficient documentation

## 2012-07-05 DIAGNOSIS — E7800 Pure hypercholesterolemia, unspecified: Secondary | ICD-10-CM | POA: Insufficient documentation

## 2012-07-05 DIAGNOSIS — E119 Type 2 diabetes mellitus without complications: Secondary | ICD-10-CM | POA: Insufficient documentation

## 2012-07-05 DIAGNOSIS — I2510 Atherosclerotic heart disease of native coronary artery without angina pectoris: Secondary | ICD-10-CM | POA: Insufficient documentation

## 2012-07-05 DIAGNOSIS — I219 Acute myocardial infarction, unspecified: Secondary | ICD-10-CM

## 2012-07-05 DIAGNOSIS — E876 Hypokalemia: Secondary | ICD-10-CM | POA: Insufficient documentation

## 2012-07-05 DIAGNOSIS — I10 Essential (primary) hypertension: Secondary | ICD-10-CM | POA: Insufficient documentation

## 2012-07-05 DIAGNOSIS — Z5189 Encounter for other specified aftercare: Secondary | ICD-10-CM | POA: Insufficient documentation

## 2012-07-05 DIAGNOSIS — I214 Non-ST elevation (NSTEMI) myocardial infarction: Secondary | ICD-10-CM | POA: Insufficient documentation

## 2012-07-05 DIAGNOSIS — Z955 Presence of coronary angioplasty implant and graft: Secondary | ICD-10-CM

## 2012-07-05 HISTORY — DX: Acute myocardial infarction, unspecified: I21.9

## 2012-07-05 NOTE — Patient Instructions (Signed)
Pt has finished orientation and is scheduled to start CR on 07/11/12 at 11 am. Pt has been instructed to arrive to class 15 minutes early for scheduled class. Pt has been instructed to wear comfortable clothing and shoes with rubber soles. Pt has been told to take their medications 1 hour prior to coming to class.  If the patient is not going to attend class, he/she has been instructed to call.   

## 2012-07-05 NOTE — Progress Notes (Signed)
Patient was referred to Cardiac Rehab by Dr. Excell Seltzer due to stent placement and myocardial infarction. During orientation advised patient on arrival and appointment times what to wear, what to do before, during and after exercise. Reviewed attendance and class policy. Talked about inclement weather and class consultation policy. Pt is scheduled to start Cardiac Rehab on 07/11/12 at 11 am. Pt was advised to come to class 5 minutes before class starts. He was also given instructions on meeting with the dietician and attending the Family Structure classes. Pt is eager to get started.

## 2012-07-11 ENCOUNTER — Encounter (HOSPITAL_COMMUNITY)
Admission: RE | Admit: 2012-07-11 | Discharge: 2012-07-11 | Disposition: A | Discharge status: Home / Self Care | Payer: 59 | Source: Ambulatory Visit | Attending: Cardiology | Admitting: Cardiology

## 2012-07-13 ENCOUNTER — Encounter (HOSPITAL_COMMUNITY)
Admission: RE | Admit: 2012-07-13 | Discharge: 2012-07-13 | Disposition: A | Discharge status: Home / Self Care | Payer: 59 | Source: Ambulatory Visit | Attending: Cardiology | Admitting: Cardiology

## 2012-07-15 ENCOUNTER — Encounter (HOSPITAL_COMMUNITY)
Admission: RE | Admit: 2012-07-15 | Discharge: 2012-07-15 | Disposition: A | Discharge status: Home / Self Care | Payer: 59 | Source: Ambulatory Visit | Attending: Cardiology | Admitting: Cardiology

## 2012-07-15 DIAGNOSIS — E7800 Pure hypercholesterolemia, unspecified: Secondary | ICD-10-CM | POA: Insufficient documentation

## 2012-07-15 DIAGNOSIS — Z79899 Other long term (current) drug therapy: Secondary | ICD-10-CM | POA: Insufficient documentation

## 2012-07-15 DIAGNOSIS — Z5189 Encounter for other specified aftercare: Secondary | ICD-10-CM | POA: Insufficient documentation

## 2012-07-15 DIAGNOSIS — I2510 Atherosclerotic heart disease of native coronary artery without angina pectoris: Secondary | ICD-10-CM | POA: Insufficient documentation

## 2012-07-15 DIAGNOSIS — I10 Essential (primary) hypertension: Secondary | ICD-10-CM | POA: Insufficient documentation

## 2012-07-15 DIAGNOSIS — E876 Hypokalemia: Secondary | ICD-10-CM | POA: Insufficient documentation

## 2012-07-15 DIAGNOSIS — E119 Type 2 diabetes mellitus without complications: Secondary | ICD-10-CM | POA: Insufficient documentation

## 2012-07-15 DIAGNOSIS — Z87891 Personal history of nicotine dependence: Secondary | ICD-10-CM | POA: Insufficient documentation

## 2012-07-15 DIAGNOSIS — I214 Non-ST elevation (NSTEMI) myocardial infarction: Secondary | ICD-10-CM | POA: Insufficient documentation

## 2012-07-18 ENCOUNTER — Encounter (HOSPITAL_COMMUNITY): Payer: 59

## 2012-07-20 ENCOUNTER — Encounter (HOSPITAL_COMMUNITY): Payer: 59

## 2012-07-22 ENCOUNTER — Encounter (HOSPITAL_COMMUNITY): Payer: 59

## 2012-07-25 ENCOUNTER — Encounter (HOSPITAL_COMMUNITY): Payer: 59

## 2012-07-27 ENCOUNTER — Encounter (HOSPITAL_COMMUNITY): Payer: 59

## 2012-07-28 ENCOUNTER — Encounter (HOSPITAL_COMMUNITY)
Admission: RE | Admit: 2012-07-28 | Discharge: 2012-07-28 | Disposition: A | Discharge status: Home / Self Care | Payer: 59 | Source: Ambulatory Visit | Attending: Cardiology | Admitting: Cardiology

## 2012-07-28 NOTE — Progress Notes (Signed)
Cardiac Rehab Medication Review by a Pharmacist  Does the patient  feel that his/her medications are working for him/her?  yes  Has the patient been experiencing any side effects to the medications prescribed?  no  Does the patient measure his/her own blood pressure or blood glucose at home?  yes   Does the patient have any problems obtaining medications due to transportation or finances?   no  Understanding of regimen: excellent Understanding of indications: excellent Potential of compliance: good    Pharmacist comments: Patient endorses sometimes not taking lantus at night if his blood sugar is less than 200. I advised him that the insulin lasts all through the next day and skipping doses could cause large fluctuations in blood sugar. He stated that he will not miss any more doses.  I also suggested getting Zantac for his intermittent symptoms of heart burn that only arise once/week. He has been taking pantoprazole as needed and stating that this does not help his heart burn immediately.    Laurence Slate 07/28/2012 9:09 AM

## 2012-07-29 ENCOUNTER — Encounter (HOSPITAL_COMMUNITY): Payer: 59

## 2012-08-01 ENCOUNTER — Encounter (HOSPITAL_COMMUNITY)
Admission: RE | Admit: 2012-08-01 | Discharge: 2012-08-01 | Disposition: A | Discharge status: Home / Self Care | Payer: 59 | Source: Ambulatory Visit | Attending: Cardiology | Admitting: Cardiology

## 2012-08-01 ENCOUNTER — Encounter (HOSPITAL_COMMUNITY): Payer: 59

## 2012-08-02 NOTE — Progress Notes (Signed)
Patient transferred to phase 2 cardiac rehab at [facility] due to work schedule. Telemetry rhythm Sinus. Vital signs stable. Patient tolerated light exercise without difficulty.Will continue to monitor the patient throughout  the program.

## 2012-08-03 ENCOUNTER — Encounter (HOSPITAL_COMMUNITY): Payer: 59

## 2012-08-03 ENCOUNTER — Encounter (HOSPITAL_COMMUNITY)
Admission: RE | Admit: 2012-08-03 | Discharge: 2012-08-03 | Disposition: A | Discharge status: Home / Self Care | Payer: 59 | Source: Ambulatory Visit | Attending: Cardiology | Admitting: Cardiology

## 2012-08-03 LAB — GLUCOSE, CAPILLARY: Glucose-Capillary: 224 mg/dL — ABNORMAL HIGH (ref 70–99)

## 2012-08-05 ENCOUNTER — Encounter (HOSPITAL_COMMUNITY): Payer: 59

## 2012-08-05 ENCOUNTER — Encounter (HOSPITAL_COMMUNITY)
Admission: RE | Admit: 2012-08-05 | Discharge: 2012-08-05 | Disposition: A | Discharge status: Home / Self Care | Payer: 59 | Source: Ambulatory Visit | Attending: Cardiology | Admitting: Cardiology

## 2012-08-08 ENCOUNTER — Telehealth: Payer: Self-pay | Admitting: Family Medicine

## 2012-08-08 ENCOUNTER — Encounter (HOSPITAL_COMMUNITY): Payer: 59

## 2012-08-08 NOTE — Telephone Encounter (Signed)
Patient called early this a.m. because he had fever with upper respiratory symptoms. He did not know if he would be able to take Tylenol for the fever because of her previous myocardial infarction. Advised him that he may take Tylenol for his fever he can call for appointment if he does not improve

## 2012-08-10 ENCOUNTER — Encounter (HOSPITAL_COMMUNITY): Payer: 59

## 2012-08-10 ENCOUNTER — Encounter (HOSPITAL_COMMUNITY)
Admission: RE | Admit: 2012-08-10 | Discharge: 2012-08-10 | Disposition: A | Discharge status: Home / Self Care | Payer: 59 | Source: Ambulatory Visit | Attending: Cardiology | Admitting: Cardiology

## 2012-08-10 LAB — GLUCOSE, CAPILLARY: Glucose-Capillary: 205 mg/dL — ABNORMAL HIGH (ref 70–99)

## 2012-08-12 ENCOUNTER — Encounter (HOSPITAL_COMMUNITY): Payer: 59

## 2012-08-12 ENCOUNTER — Encounter (HOSPITAL_COMMUNITY)
Admission: RE | Admit: 2012-08-12 | Discharge: 2012-08-12 | Disposition: A | Discharge status: Home / Self Care | Payer: 59 | Source: Ambulatory Visit | Attending: Cardiology | Admitting: Cardiology

## 2012-08-12 NOTE — Progress Notes (Signed)
Reviewed home exercise with pt today.  Pt plans to walk at home for exercise.  Reviewed THR, pulse (needs practice), RPE, sign and symptoms, NTG use, and when to call 911 or MD.  Pt voiced understanding. [place], MA, ACSM RCEP  

## 2012-08-15 ENCOUNTER — Encounter (HOSPITAL_COMMUNITY): Payer: 59

## 2012-08-17 ENCOUNTER — Encounter (HOSPITAL_COMMUNITY)
Admission: RE | Admit: 2012-08-17 | Discharge: 2012-08-17 | Disposition: A | Discharge status: Home / Self Care | Payer: 59 | Source: Ambulatory Visit | Attending: Cardiology | Admitting: Cardiology

## 2012-08-17 ENCOUNTER — Encounter (HOSPITAL_COMMUNITY): Payer: 59

## 2012-08-17 DIAGNOSIS — E7800 Pure hypercholesterolemia, unspecified: Secondary | ICD-10-CM | POA: Insufficient documentation

## 2012-08-17 DIAGNOSIS — E876 Hypokalemia: Secondary | ICD-10-CM | POA: Insufficient documentation

## 2012-08-17 DIAGNOSIS — Z87891 Personal history of nicotine dependence: Secondary | ICD-10-CM | POA: Insufficient documentation

## 2012-08-17 DIAGNOSIS — Z5189 Encounter for other specified aftercare: Secondary | ICD-10-CM | POA: Insufficient documentation

## 2012-08-17 DIAGNOSIS — E119 Type 2 diabetes mellitus without complications: Secondary | ICD-10-CM | POA: Insufficient documentation

## 2012-08-17 DIAGNOSIS — Z79899 Other long term (current) drug therapy: Secondary | ICD-10-CM | POA: Insufficient documentation

## 2012-08-17 DIAGNOSIS — I2510 Atherosclerotic heart disease of native coronary artery without angina pectoris: Secondary | ICD-10-CM | POA: Insufficient documentation

## 2012-08-17 DIAGNOSIS — I214 Non-ST elevation (NSTEMI) myocardial infarction: Secondary | ICD-10-CM | POA: Insufficient documentation

## 2012-08-17 DIAGNOSIS — I10 Essential (primary) hypertension: Secondary | ICD-10-CM | POA: Insufficient documentation

## 2012-08-19 ENCOUNTER — Encounter (HOSPITAL_COMMUNITY)
Admission: RE | Admit: 2012-08-19 | Discharge: 2012-08-19 | Disposition: A | Discharge status: Home / Self Care | Payer: 59 | Source: Ambulatory Visit | Attending: Cardiology | Admitting: Cardiology

## 2012-08-19 ENCOUNTER — Encounter (HOSPITAL_COMMUNITY): Payer: 59

## 2012-08-19 LAB — GLUCOSE, CAPILLARY: Glucose-Capillary: 209 mg/dL — ABNORMAL HIGH (ref 70–99)

## 2012-08-22 ENCOUNTER — Encounter (HOSPITAL_COMMUNITY): Payer: 59

## 2012-08-22 ENCOUNTER — Encounter (HOSPITAL_COMMUNITY)
Admission: RE | Admit: 2012-08-22 | Discharge: 2012-08-22 | Disposition: A | Discharge status: Home / Self Care | Payer: 59 | Source: Ambulatory Visit | Attending: Cardiology | Admitting: Cardiology

## 2012-08-22 LAB — GLUCOSE, CAPILLARY: Glucose-Capillary: 132 mg/dL — ABNORMAL HIGH (ref 70–99)

## 2012-08-24 ENCOUNTER — Encounter (HOSPITAL_COMMUNITY): Payer: 59

## 2012-08-26 ENCOUNTER — Encounter (HOSPITAL_COMMUNITY): Payer: 59

## 2012-08-26 ENCOUNTER — Encounter (HOSPITAL_COMMUNITY)
Admission: RE | Admit: 2012-08-26 | Discharge: 2012-08-26 | Disposition: A | Discharge status: Home / Self Care | Payer: 59 | Source: Ambulatory Visit | Attending: Cardiology | Admitting: Cardiology

## 2012-08-29 ENCOUNTER — Encounter (HOSPITAL_COMMUNITY)
Admission: RE | Admit: 2012-08-29 | Discharge: 2012-08-29 | Disposition: A | Discharge status: Home / Self Care | Payer: 59 | Source: Ambulatory Visit | Attending: Cardiology | Admitting: Cardiology

## 2012-08-29 ENCOUNTER — Encounter (HOSPITAL_COMMUNITY): Payer: 59

## 2012-08-31 ENCOUNTER — Encounter (HOSPITAL_COMMUNITY): Payer: 59

## 2012-08-31 ENCOUNTER — Encounter (HOSPITAL_COMMUNITY)
Admission: RE | Admit: 2012-08-31 | Discharge: 2012-08-31 | Disposition: A | Discharge status: Home / Self Care | Payer: 59 | Source: Ambulatory Visit | Attending: Cardiology | Admitting: Cardiology

## 2012-08-31 LAB — GLUCOSE, CAPILLARY: Glucose-Capillary: 79 mg/dL (ref 70–99)

## 2012-09-01 NOTE — Progress Notes (Signed)
Post exercise CBG 79 yesterday.  Patient given lemonade and graham crackers.  Patient asymptomatic.  Recheck CBG 91. Patient left without complaints. Will continue to monitor the patient throughout  the program.

## 2012-09-02 ENCOUNTER — Encounter (HOSPITAL_COMMUNITY)
Admission: RE | Admit: 2012-09-02 | Discharge: 2012-09-02 | Disposition: A | Discharge status: Home / Self Care | Payer: 59 | Source: Ambulatory Visit | Attending: Cardiology | Admitting: Cardiology

## 2012-09-02 ENCOUNTER — Encounter (HOSPITAL_COMMUNITY): Payer: 59

## 2012-09-05 ENCOUNTER — Encounter (HOSPITAL_COMMUNITY): Payer: 59

## 2012-09-05 ENCOUNTER — Encounter (HOSPITAL_COMMUNITY)
Admission: RE | Admit: 2012-09-05 | Discharge: 2012-09-05 | Disposition: A | Discharge status: Home / Self Care | Payer: 59 | Source: Ambulatory Visit | Attending: Cardiology | Admitting: Cardiology

## 2012-09-05 ENCOUNTER — Ambulatory Visit: Payer: 59 | Admitting: Family Medicine

## 2012-09-05 LAB — GLUCOSE, CAPILLARY: Glucose-Capillary: 113 mg/dL — ABNORMAL HIGH (ref 70–99)

## 2012-09-06 ENCOUNTER — Ambulatory Visit (INDEPENDENT_AMBULATORY_CARE_PROVIDER_SITE_OTHER): Payer: 59 | Admitting: Family Medicine

## 2012-09-06 ENCOUNTER — Encounter: Payer: Self-pay | Admitting: Family Medicine

## 2012-09-06 VITALS — BP 120/82 | HR 84 | Resp 15 | Ht 70.0 in | Wt 203.0 lb

## 2012-09-06 DIAGNOSIS — R5383 Other fatigue: Secondary | ICD-10-CM

## 2012-09-06 DIAGNOSIS — F1010 Alcohol abuse, uncomplicated: Secondary | ICD-10-CM

## 2012-09-06 DIAGNOSIS — E785 Hyperlipidemia, unspecified: Secondary | ICD-10-CM

## 2012-09-06 DIAGNOSIS — F17200 Nicotine dependence, unspecified, uncomplicated: Secondary | ICD-10-CM

## 2012-09-06 DIAGNOSIS — R5381 Other malaise: Secondary | ICD-10-CM

## 2012-09-06 DIAGNOSIS — I10 Essential (primary) hypertension: Secondary | ICD-10-CM

## 2012-09-06 DIAGNOSIS — Z23 Encounter for immunization: Secondary | ICD-10-CM

## 2012-09-06 DIAGNOSIS — E663 Overweight: Secondary | ICD-10-CM

## 2012-09-06 DIAGNOSIS — E1165 Type 2 diabetes mellitus with hyperglycemia: Secondary | ICD-10-CM

## 2012-09-06 NOTE — Progress Notes (Signed)
  Subjective:    Patient ID: Antonio Cobb, male    DOB: 01-14-1961, 51 y.o.   MRN: [id number]  HPI The PT is here for follow up and re-evaluation of chronic medical conditions, medication management and review of any available recent lab and radiology data.  Preventive health is updated, specifically  Cancer screening and Immunization.   Questions or concerns regarding consultations or procedures which the PT has had in the interim are  Addressed.Currently in cardiac rehab which he enjoys and is benefiting from greatly. States he is concerned about his blood pressure, because he is being told different values at different places, wants this checked The PT denies any adverse reactions to current medications since the last visit.  Smoking intermittently and alcohol use is still a problem Blood sugars he reports to be improving , but are still uncontolled and noted to vary with his intakeesp alcohol     Review of Systems See HPI Denies recent fever or chills. Denies sinus pressure, nasal congestion, ear pain or sore throat. Denies chest congestion, productive cough or wheezing. Denies chest pains, palpitations and leg swelling Denies abdominal pain, nausea, vomiting,diarrhea or constipation.   Denies dysuria, frequency, hesitancy or incontinence. Denies joint pain, swelling and limitation in mobility. Denies headaches, seizures, numbness, or tingling. Denies depression, anxiety or insomnia. Denies skin break down or rash.        Objective:   Physical Exam  Patient alert and oriented and in no cardiopulmonary distress.  HEENT: No facial asymmetry, EOMI, no sinus tenderness,  oropharynx pink and moist.  Neck supple no adenopathy.  Chest: Clear to auscultation bilaterally.Decreased air entry throughout  CVS: S1, S2 no murmurs, no S3.  ABD: Soft non tender. Bowel sounds normal.  Ext: No edema  MS: Adequate ROM spine, shoulders, hips and knees.  Skin: Intact, no  ulcerations or rash noted.  Psych: Good eye contact, normal affect. Memory intact not anxious or depressed appearing.  CNS: CN 2-12 intact, power, tone and sensation normal throughout.       Assessment & Plan:

## 2012-09-06 NOTE — Patient Instructions (Addendum)
CPE in [facility] ([place]) call if you need me before  Fasting lipid, cmp and EGFR, TSH, hBA1C first week in December  Flu vaccine today  You need to entirely stop smoking and drinking any alcohol  I willl refer you for a chest CT scan due to your long smoking history  Goal for fasting blood sugar ranges from 80 to 120 and 2 hours after any meal or at bedtime should be between 130 to 170.

## 2012-09-07 ENCOUNTER — Encounter (HOSPITAL_COMMUNITY)
Admission: RE | Admit: 2012-09-07 | Discharge: 2012-09-07 | Disposition: A | Discharge status: Home / Self Care | Payer: 59 | Source: Ambulatory Visit | Attending: Cardiology | Admitting: Cardiology

## 2012-09-07 ENCOUNTER — Encounter (HOSPITAL_COMMUNITY): Payer: 59

## 2012-09-07 DIAGNOSIS — Z23 Encounter for immunization: Secondary | ICD-10-CM

## 2012-09-09 ENCOUNTER — Encounter (HOSPITAL_COMMUNITY): Payer: 59

## 2012-09-09 ENCOUNTER — Encounter (HOSPITAL_COMMUNITY)
Admission: RE | Admit: 2012-09-09 | Discharge: 2012-09-09 | Disposition: A | Discharge status: Home / Self Care | Payer: 59 | Source: Ambulatory Visit | Attending: Cardiology | Admitting: Cardiology

## 2012-09-10 ENCOUNTER — Other Ambulatory Visit: Payer: Self-pay | Admitting: Family Medicine

## 2012-09-12 ENCOUNTER — Encounter (HOSPITAL_COMMUNITY): Admission: RE | Admit: 2012-09-12 | Payer: 59 | Source: Ambulatory Visit

## 2012-09-12 ENCOUNTER — Encounter (HOSPITAL_COMMUNITY): Payer: 59

## 2012-09-12 ENCOUNTER — Encounter (HOSPITAL_COMMUNITY)
Admission: RE | Admit: 2012-09-12 | Discharge: 2012-09-12 | Disposition: A | Discharge status: Home / Self Care | Payer: 59 | Source: Ambulatory Visit | Attending: Cardiology | Admitting: Cardiology

## 2012-09-12 LAB — GLUCOSE, CAPILLARY
Glucose-Capillary: 138 mg/dL — ABNORMAL HIGH (ref 70–99)
Glucose-Capillary: 92 mg/dL (ref 70–99)

## 2012-09-14 ENCOUNTER — Encounter (HOSPITAL_COMMUNITY): Payer: 59

## 2012-09-14 ENCOUNTER — Encounter (HOSPITAL_COMMUNITY)
Admission: RE | Admit: 2012-09-14 | Discharge: 2012-09-14 | Disposition: A | Discharge status: Home / Self Care | Payer: 59 | Source: Ambulatory Visit | Attending: Cardiology | Admitting: Cardiology

## 2012-09-14 DIAGNOSIS — E876 Hypokalemia: Secondary | ICD-10-CM | POA: Insufficient documentation

## 2012-09-14 DIAGNOSIS — E7800 Pure hypercholesterolemia, unspecified: Secondary | ICD-10-CM | POA: Insufficient documentation

## 2012-09-14 DIAGNOSIS — I10 Essential (primary) hypertension: Secondary | ICD-10-CM | POA: Insufficient documentation

## 2012-09-14 DIAGNOSIS — Z87891 Personal history of nicotine dependence: Secondary | ICD-10-CM | POA: Insufficient documentation

## 2012-09-14 DIAGNOSIS — E119 Type 2 diabetes mellitus without complications: Secondary | ICD-10-CM | POA: Insufficient documentation

## 2012-09-14 DIAGNOSIS — I214 Non-ST elevation (NSTEMI) myocardial infarction: Secondary | ICD-10-CM | POA: Insufficient documentation

## 2012-09-14 DIAGNOSIS — Z5189 Encounter for other specified aftercare: Secondary | ICD-10-CM | POA: Insufficient documentation

## 2012-09-14 DIAGNOSIS — I2510 Atherosclerotic heart disease of native coronary artery without angina pectoris: Secondary | ICD-10-CM | POA: Insufficient documentation

## 2012-09-14 DIAGNOSIS — Z79899 Other long term (current) drug therapy: Secondary | ICD-10-CM | POA: Insufficient documentation

## 2012-09-14 LAB — GLUCOSE, CAPILLARY: Glucose-Capillary: 109 mg/dL — ABNORMAL HIGH (ref 70–99)

## 2012-09-16 ENCOUNTER — Encounter (HOSPITAL_COMMUNITY): Payer: 59

## 2012-09-16 ENCOUNTER — Encounter (HOSPITAL_COMMUNITY)
Admission: RE | Admit: 2012-09-16 | Discharge: 2012-09-16 | Disposition: A | Discharge status: Home / Self Care | Payer: 59 | Source: Ambulatory Visit | Attending: Cardiology | Admitting: Cardiology

## 2012-09-19 ENCOUNTER — Encounter (HOSPITAL_COMMUNITY)
Admission: RE | Admit: 2012-09-19 | Discharge: 2012-09-19 | Disposition: A | Discharge status: Home / Self Care | Payer: 59 | Source: Ambulatory Visit | Attending: Cardiology | Admitting: Cardiology

## 2012-09-19 ENCOUNTER — Encounter (HOSPITAL_COMMUNITY): Payer: 59

## 2012-09-19 LAB — GLUCOSE, CAPILLARY
Glucose-Capillary: 140 mg/dL — ABNORMAL HIGH (ref 70–99)
Glucose-Capillary: 163 mg/dL — ABNORMAL HIGH (ref 70–99)

## 2012-09-19 NOTE — Progress Notes (Signed)
Antonio Cobb 51 y.o. male Nutrition Note Spoke with pt.  Nutrition Plan, Nutrition Survey, and cholesterol goals reviewed with pt. Pt is following Step 1 of the Therapeutic Lifestyle Changes diet. Pt recently quit using tobacco products in 05/2012. Pt wt reportedly stable since admission. Wt loss recommended when pt is ready to make another lifestyle change. Wt loss tips reviewed. Pt continues using ETOH. Pt is diabetic. Last A1c indicates blood glucose not well-controlled. Pt states his A1c was re-checked last week. This Clinical research associate went over Diabetes Education test results. Pt expressed understanding of the above information reviewed. Pt aware of nutrition education classes offered and is unable to attend nutrition classes. Nutrition Diagnosis   Food-and nutrition-related knowledge deficit related to lack of exposure to information as related to diagnosis of: ? CVD ? DM (A1c8.3)    Overweight related to excessive energy intake as evidenced by a BMI of 29.3  Nutrition RX/ Estimated Daily Nutrition Needs for: wt maintenance  2600-2800 Kcal, 85-90 gm fat, 17-19 gm sat fat, 2.6-2.8 gm trans-fat, <1500 mg sodium, 325 gm CHO  Nutrition RX/ Estimated Daily Nutrition Needs for: wt loss when pt is ready 1600-2100 Kcal, 45-55 gm fat, 10-14 gm sat fat, 1.6-2.1 gm trans-fat, <1500 mg sodium, 175-250 gm CHO   Nutrition Intervention   Pt's individual nutrition plan including cholesterol goals reviewed with pt.   Benefits of adopting Therapeutic Lifestyle Changes discussed when Medficts reviewed.   Pt to attend the Portion Distortion class - met 09/07/12   Pt to attend the  ? Diabetes Q & A class - met 09/09/12   Pt given handouts for: ? Nutrition I class ? Nutrition II class ? Diabetes Blitz class   Continue client-centered nutrition education by RD, as part of interdisciplinary care. Goal(s)   Pt to identify and limit food sources of saturated fat, trans fat, and cholesterol   Pt to describe the benefit of  including fruits, vegetables, whole grains, and low-fat dairy products in a heart healthy meal plan.   CBG concentrations in the normal range or as close to normal as is safely possible. Monitor and Evaluate progress toward nutrition goal with team. Nutrition Risk: Change to Moderate

## 2012-09-20 ENCOUNTER — Encounter: Payer: Self-pay | Admitting: Cardiology

## 2012-09-20 ENCOUNTER — Encounter: Payer: Self-pay | Admitting: Family Medicine

## 2012-09-20 NOTE — Assessment & Plan Note (Signed)
Endocrinology caring for pt, remains uncontrolled at this time. Pt states he has noted now that he is in cardiac rehab that when he drinks alcohol, which is very often, his blood sugar is high

## 2012-09-20 NOTE — Assessment & Plan Note (Signed)
An ongoing problem, no interest in group therapy, but cutting back per pt and spouse report

## 2012-09-20 NOTE — Assessment & Plan Note (Signed)
unchanged Patient re-educated about  the importance of commitment to a  minimum of 150 minutes of exercise per week. The importance of healthy food choices with portion control discussed. Encouraged to start a food diary, count calories and to consider  joining a support group. Sample diet sheets offered. Goals set by the patient for the next several months.    

## 2012-09-20 NOTE — Assessment & Plan Note (Signed)
Controlled, no change in medication DASH diet and commitment to daily physical activity for a minimum of 30 minutes discussed and encouraged, as a part of hypertension management. The importance of attaining a healthy weight is also discussed.  

## 2012-09-20 NOTE — Assessment & Plan Note (Signed)
Patient counseled for approximately 5 minutes regarding the health risks of ongoing nicotine use, specifically all types of cancer, heart disease, stroke and respiratory failure. The options available for help with cessation ,the behavioral changes to assist the process, and the option to either gradully reduce usage  Or abruptly stop.is also discussed. Pt is also encouraged to set specific goals in number of cigarettes used daily, as well as to set a quit date. Refer for chest CT scan

## 2012-09-20 NOTE — Assessment & Plan Note (Signed)
Uncontrolled, generally non compliant both with medication as well as diet. Trying to change both Lab prior to next visit

## 2012-09-21 ENCOUNTER — Encounter (HOSPITAL_COMMUNITY)
Admission: RE | Admit: 2012-09-21 | Discharge: 2012-09-21 | Disposition: A | Discharge status: Home / Self Care | Payer: 59 | Source: Ambulatory Visit | Attending: Cardiology | Admitting: Cardiology

## 2012-09-21 ENCOUNTER — Encounter (HOSPITAL_COMMUNITY): Payer: 59

## 2012-09-23 ENCOUNTER — Encounter (HOSPITAL_COMMUNITY): Payer: 59

## 2012-09-23 ENCOUNTER — Encounter (HOSPITAL_COMMUNITY)
Admission: RE | Admit: 2012-09-23 | Discharge: 2012-09-23 | Disposition: A | Discharge status: Home / Self Care | Payer: 59 | Source: Ambulatory Visit | Attending: Cardiology | Admitting: Cardiology

## 2012-09-26 ENCOUNTER — Encounter (HOSPITAL_COMMUNITY): Payer: 59

## 2012-09-26 ENCOUNTER — Encounter (HOSPITAL_COMMUNITY)
Admission: RE | Admit: 2012-09-26 | Discharge: 2012-09-26 | Disposition: A | Discharge status: Home / Self Care | Payer: 59 | Source: Ambulatory Visit | Attending: Cardiology | Admitting: Cardiology

## 2012-09-28 ENCOUNTER — Encounter (HOSPITAL_COMMUNITY)
Admission: RE | Admit: 2012-09-28 | Discharge: 2012-09-28 | Disposition: A | Discharge status: Home / Self Care | Payer: 59 | Source: Ambulatory Visit | Attending: Cardiology | Admitting: Cardiology

## 2012-09-28 ENCOUNTER — Encounter (HOSPITAL_COMMUNITY): Payer: 59

## 2012-09-28 LAB — GLUCOSE, CAPILLARY: Glucose-Capillary: 144 mg/dL — ABNORMAL HIGH (ref 70–99)

## 2012-09-30 ENCOUNTER — Encounter (HOSPITAL_COMMUNITY): Payer: 59

## 2012-09-30 ENCOUNTER — Encounter (HOSPITAL_COMMUNITY)
Admission: RE | Admit: 2012-09-30 | Discharge: 2012-09-30 | Disposition: A | Discharge status: Home / Self Care | Payer: 59 | Source: Ambulatory Visit | Attending: Cardiology | Admitting: Cardiology

## 2012-10-03 ENCOUNTER — Encounter (HOSPITAL_COMMUNITY): Payer: 59

## 2012-10-05 ENCOUNTER — Encounter (HOSPITAL_COMMUNITY): Payer: 59

## 2012-10-05 ENCOUNTER — Encounter (HOSPITAL_COMMUNITY)
Admission: RE | Admit: 2012-10-05 | Discharge: 2012-10-05 | Disposition: A | Discharge status: Home / Self Care | Payer: 59 | Source: Ambulatory Visit | Attending: Cardiology | Admitting: Cardiology

## 2012-10-05 ENCOUNTER — Other Ambulatory Visit: Payer: Self-pay | Admitting: Family Medicine

## 2012-10-05 LAB — GLUCOSE, CAPILLARY: Glucose-Capillary: 116 mg/dL — ABNORMAL HIGH (ref 70–99)

## 2012-10-07 ENCOUNTER — Encounter (HOSPITAL_COMMUNITY): Payer: 59

## 2012-10-07 ENCOUNTER — Encounter (HOSPITAL_COMMUNITY)
Admission: RE | Admit: 2012-10-07 | Discharge: 2012-10-07 | Disposition: A | Discharge status: Home / Self Care | Payer: 59 | Source: Ambulatory Visit | Attending: Cardiology | Admitting: Cardiology

## 2012-10-07 LAB — GLUCOSE, CAPILLARY: Glucose-Capillary: 163 mg/dL — ABNORMAL HIGH (ref 70–99)

## 2012-10-10 ENCOUNTER — Encounter (HOSPITAL_COMMUNITY): Payer: 59

## 2012-10-10 ENCOUNTER — Other Ambulatory Visit: Payer: Self-pay | Admitting: Family Medicine

## 2012-10-10 ENCOUNTER — Encounter (HOSPITAL_COMMUNITY)
Admission: RE | Admit: 2012-10-10 | Discharge: 2012-10-10 | Disposition: A | Discharge status: Home / Self Care | Payer: 59 | Source: Ambulatory Visit | Attending: Cardiology | Admitting: Cardiology

## 2012-10-10 DIAGNOSIS — I10 Essential (primary) hypertension: Secondary | ICD-10-CM

## 2012-10-10 DIAGNOSIS — F17200 Nicotine dependence, unspecified, uncomplicated: Secondary | ICD-10-CM

## 2012-10-10 LAB — GLUCOSE, CAPILLARY: Glucose-Capillary: 98 mg/dL (ref 70–99)

## 2012-10-12 ENCOUNTER — Encounter (HOSPITAL_COMMUNITY): Payer: 59

## 2012-10-14 ENCOUNTER — Encounter (HOSPITAL_COMMUNITY): Payer: 59

## 2012-10-17 ENCOUNTER — Encounter (HOSPITAL_COMMUNITY): Payer: 59

## 2012-10-19 ENCOUNTER — Encounter (HOSPITAL_COMMUNITY)
Admission: RE | Admit: 2012-10-19 | Discharge: 2012-10-19 | Disposition: A | Discharge status: Home / Self Care | Payer: 59 | Source: Ambulatory Visit | Attending: Cardiology | Admitting: Cardiology

## 2012-10-19 ENCOUNTER — Encounter (HOSPITAL_COMMUNITY): Payer: 59

## 2012-10-19 DIAGNOSIS — I2510 Atherosclerotic heart disease of native coronary artery without angina pectoris: Secondary | ICD-10-CM | POA: Insufficient documentation

## 2012-10-19 DIAGNOSIS — E119 Type 2 diabetes mellitus without complications: Secondary | ICD-10-CM | POA: Insufficient documentation

## 2012-10-19 DIAGNOSIS — I10 Essential (primary) hypertension: Secondary | ICD-10-CM | POA: Insufficient documentation

## 2012-10-19 DIAGNOSIS — E876 Hypokalemia: Secondary | ICD-10-CM | POA: Insufficient documentation

## 2012-10-19 DIAGNOSIS — I214 Non-ST elevation (NSTEMI) myocardial infarction: Secondary | ICD-10-CM | POA: Insufficient documentation

## 2012-10-19 DIAGNOSIS — Z5189 Encounter for other specified aftercare: Secondary | ICD-10-CM | POA: Insufficient documentation

## 2012-10-19 DIAGNOSIS — E7800 Pure hypercholesterolemia, unspecified: Secondary | ICD-10-CM | POA: Insufficient documentation

## 2012-10-19 DIAGNOSIS — Z79899 Other long term (current) drug therapy: Secondary | ICD-10-CM | POA: Insufficient documentation

## 2012-10-19 DIAGNOSIS — Z87891 Personal history of nicotine dependence: Secondary | ICD-10-CM | POA: Insufficient documentation

## 2012-10-19 NOTE — Progress Notes (Signed)
Intermittent exertional blood pressure elevations noted at cardiac rehab. Antonio Cobb is taking his medications as prescribed.  Exit blood pressure today 126/64.  Will fax exercise flow sheets to Dr.Rothbarts office for review.

## 2012-10-21 ENCOUNTER — Encounter (HOSPITAL_COMMUNITY)
Admission: RE | Admit: 2012-10-21 | Discharge: 2012-10-21 | Disposition: A | Discharge status: Home / Self Care | Payer: 59 | Source: Ambulatory Visit | Attending: Cardiology | Admitting: Cardiology

## 2012-10-21 ENCOUNTER — Encounter (HOSPITAL_COMMUNITY): Payer: 59

## 2012-10-24 ENCOUNTER — Encounter (HOSPITAL_COMMUNITY): Payer: 59

## 2012-10-24 ENCOUNTER — Encounter (HOSPITAL_COMMUNITY)
Admission: RE | Admit: 2012-10-24 | Discharge: 2012-10-24 | Disposition: A | Discharge status: Home / Self Care | Payer: 59 | Source: Ambulatory Visit | Attending: Cardiology | Admitting: Cardiology

## 2012-10-26 ENCOUNTER — Encounter (HOSPITAL_COMMUNITY)
Admission: RE | Admit: 2012-10-26 | Discharge: 2012-10-26 | Disposition: A | Discharge status: Home / Self Care | Payer: 59 | Source: Ambulatory Visit | Attending: Cardiology | Admitting: Cardiology

## 2012-10-26 ENCOUNTER — Encounter (HOSPITAL_COMMUNITY): Payer: 59

## 2012-10-27 ENCOUNTER — Other Ambulatory Visit: Payer: Self-pay | Admitting: Cardiology

## 2012-10-27 ENCOUNTER — Telehealth: Payer: Self-pay | Admitting: Family Medicine

## 2012-10-27 ENCOUNTER — Other Ambulatory Visit: Payer: Self-pay | Admitting: *Deleted

## 2012-10-27 ENCOUNTER — Other Ambulatory Visit: Payer: Self-pay

## 2012-10-27 MED ORDER — PRASUGREL HCL 10 MG PO TABS: 10.0000 mg | ORAL_TABLET | Freq: Every day | ORAL | Status: DC

## 2012-10-27 MED ORDER — METOPROLOL SUCCINATE ER 50 MG PO TB24: 50.0000 mg | ORAL_TABLET | Freq: Two times a day (BID) | ORAL | Status: DC

## 2012-10-27 NOTE — Telephone Encounter (Signed)
Refilled metoprolol.   Cardiologist to refill effient.

## 2012-10-28 ENCOUNTER — Encounter (HOSPITAL_COMMUNITY): Payer: 59

## 2012-10-28 ENCOUNTER — Encounter (HOSPITAL_COMMUNITY)
Admission: RE | Admit: 2012-10-28 | Discharge: 2012-10-28 | Disposition: A | Discharge status: Home / Self Care | Payer: 59 | Source: Ambulatory Visit | Attending: Cardiology | Admitting: Cardiology

## 2012-10-28 LAB — GLUCOSE, CAPILLARY: Glucose-Capillary: 255 mg/dL — ABNORMAL HIGH (ref 70–99)

## 2012-10-31 ENCOUNTER — Encounter (HOSPITAL_COMMUNITY): Payer: 59

## 2012-10-31 ENCOUNTER — Encounter (HOSPITAL_COMMUNITY)
Admission: RE | Admit: 2012-10-31 | Discharge: 2012-10-31 | Disposition: A | Discharge status: Home / Self Care | Payer: 59 | Source: Ambulatory Visit | Attending: Cardiology | Admitting: Cardiology

## 2012-10-31 LAB — GLUCOSE, CAPILLARY: Glucose-Capillary: 236 mg/dL — ABNORMAL HIGH (ref 70–99)

## 2012-11-02 ENCOUNTER — Encounter (HOSPITAL_COMMUNITY): Payer: 59

## 2012-11-02 ENCOUNTER — Encounter (HOSPITAL_COMMUNITY)
Admission: RE | Admit: 2012-11-02 | Discharge: 2012-11-02 | Disposition: A | Discharge status: Home / Self Care | Payer: 59 | Source: Ambulatory Visit | Attending: Cardiology | Admitting: Cardiology

## 2012-11-02 LAB — GLUCOSE, CAPILLARY: Glucose-Capillary: 171 mg/dL — ABNORMAL HIGH (ref 70–99)

## 2012-11-04 ENCOUNTER — Encounter (HOSPITAL_COMMUNITY): Payer: 59

## 2012-11-15 ENCOUNTER — Other Ambulatory Visit: Payer: Self-pay | Admitting: Family Medicine

## 2012-11-18 ENCOUNTER — Other Ambulatory Visit: Payer: Self-pay

## 2012-11-24 ENCOUNTER — Telehealth: Payer: Self-pay | Admitting: Adult Health

## 2012-11-24 NOTE — Telephone Encounter (Signed)
PT NEEDS METOPROLOL CALLED IN LAST TIME IS WAS CALLED IN BY DR SIMPSONS OFFICE BUT THEY DID NOT FILL FOR TWICE A DAY

## 2012-11-25 NOTE — Telephone Encounter (Signed)
.  left message to have patient return my call to advise MD RR sent a RX in for the medication on 10-27-12 for #60 with 3 refills to [facility] and pt should still have refills and directions indicate twice daily

## 2012-11-25 NOTE — Telephone Encounter (Signed)
Called pt to inform about refill that RR sent in on 10-27-12, pt not sure which refill he picked up, called [facility] and spoke to rep Claypool and she advised that the pt prescriptions were called in by several different providers, noted Berenice Primas last refill in April and the 10-27-12 refill was the last one from the April prescription from Berenice Primas, also noted in July MD Odella Aquas from [address] took over filling pt effient, Educational psychologist, advised Marchelle Folks to fill the #60 with 3 refills sent in by RR per pt is out currently and states he has always taken medications twice daily, pharmacy rep agreed that in the pt log he has been taking twice daily with them as well, advised pt the rx will be filled per notes he has been out for a few days and that he needs to keeps his 12-05-12 apt with KL to discuss further, will contact MD Arguello to clarify

## 2012-11-29 ENCOUNTER — Encounter: Payer: 59 | Admitting: Family Medicine

## 2012-11-30 ENCOUNTER — Encounter: Payer: Self-pay | Admitting: Family Medicine

## 2012-12-02 NOTE — Telephone Encounter (Signed)
FYI: Noted after research that MD Odella Aquas whom adjusted pt medications is located at [address] office, please be aware of adjustments to discuss with pt at upcoming OV noted on 12-05-12, see prior notations

## 2012-12-05 ENCOUNTER — Ambulatory Visit (INDEPENDENT_AMBULATORY_CARE_PROVIDER_SITE_OTHER): Payer: 59 | Admitting: Adult Health

## 2012-12-05 ENCOUNTER — Encounter: Payer: Self-pay | Admitting: Adult Health

## 2012-12-05 VITALS — BP 120/89 | HR 88 | Ht 70.0 in | Wt 206.2 lb

## 2012-12-05 DIAGNOSIS — I10 Essential (primary) hypertension: Secondary | ICD-10-CM

## 2012-12-05 DIAGNOSIS — I2510 Atherosclerotic heart disease of native coronary artery without angina pectoris: Secondary | ICD-10-CM

## 2012-12-05 DIAGNOSIS — E785 Hyperlipidemia, unspecified: Secondary | ICD-10-CM

## 2012-12-05 MED ORDER — IRBESARTAN 300 MG PO TABS: 300.0000 mg | ORAL_TABLET | Freq: Every day | ORAL | Status: DC

## 2012-12-05 MED ORDER — NITROGLYCERIN 0.4 MG SL SUBL: 0.4000 mg | SUBLINGUAL_TABLET | SUBLINGUAL | Status: DC | PRN

## 2012-12-05 NOTE — Progress Notes (Signed)
HPI: [name] is a 50 y/o patient of Dr. Dietrich Pates we are seeing for ongoing assessment and treatment of CAD with history of inferolateral STEMI in [facility] f2013, with DES to the LCx, with non-obstructive CAD in the RCA and LAD. EF of 55% on DAPT with ASA and Effient, hypertension, hypercholesterolemia, with ongoing cigar smoking of one a day. He is without complaint, has gone to cardiac rehab and remains active. He has not had to use NTG. He has returned to work as a Curator. He is also working out at a gym without complaints.   No Known Allergies  Current Outpatient Prescriptions  Medication Sig Dispense Refill  . albuterol (PROVENTIL HFA;VENTOLIN HFA) 108 (90 BASE) MCG/ACT inhaler Inhale 2 puffs into the lungs every 6 (six) hours as needed. For shortness of breath      . amLODipine (NORVASC) 10 MG tablet take 1 tablet once daily  30 tablet  3  . aspirin (ASPIRIN ADULT LOW STRENGTH) 81 MG EC tablet Take 81 mg by mouth daily.        [name] atorvastatin (LIPITOR) 40 MG tablet Take 1 tablet (40 mg total) by mouth daily at 6 PM.  30 tablet  3  . glucose blood test strip Use as instructed for three times daily testing DX 250.00  100 each  12  . hydrochlorothiazide (HYDRODIURIL) 25 MG tablet take 1 tablet once daily  30 tablet  3  . insulin glargine (LANTUS SOLOSTAR) 100 UNIT/ML injection Inject 30 Units into the skin at bedtime.      . irbesartan (AVAPRO) 300 MG tablet Take 1 tablet (300 mg total) by mouth daily.  90 tablet  0  . metoprolol succinate (TOPROL-XL) 50 MG 24 hr tablet Take 1 tablet (50 mg total) by mouth 2 (two) times daily. Take with or immediately following a meal.  60 tablet  3  . nitroGLYCERIN (NITROSTAT) 0.4 MG SL tablet Place 1 tablet (0.4 mg total) under the tongue every 5 (five) minutes as needed for chest pain.  25 tablet  3  . Omega-3 Fatty Acids (FISH OIL) 1000 MG CAPS Take by mouth daily.        . prasugrel (EFFIENT) 10 MG TABS Take 1 tablet (10 mg total) by mouth daily.  30  tablet  11  . Saxagliptin-Metformin 2.04-999 MG TB24 Take 2 tablets by mouth daily.  30 tablet    . spironolactone (ALDACTONE) 25 MG tablet take 1 tablet once daily  30 tablet  3    Past Medical History  Diagnosis Date  . Alcohol use   . Obesity   . Nicotine addiction   . Hyperlipidemia   . Diabetes mellitus, type 2   . Hypertension   . Other and unspecified alcohol dependence, unspecified drinking behavior   . Elevated cholesterol   . Myocardial infarction 05/23/12    Past Surgical History  Procedure Date  . None   . Cardiac catheterization 2 yrs ago  . Polypectomy 10/16/2011    Procedure: POLYPECTOMY;  Surgeon: Arlyce Harman, MD;  Location: [facility];  Service: Endoscopy;;  Polypoid Lesion, Transverse and Sigmoid Colon  . Coronary stent placement 05/23/12    ZOX:WRUEAV of systems complete and found to be negative unless listed above  PHYSICAL EXAM BP 120/89  Pulse 88  Ht 5\' 10"  (1.778 m)  Wt 206 lb 4 oz (93.554 kg)  BMI 29.59 kg/m2  SpO2 96%  General: Well developed, well nourished, in no acute distress Head: Eyes PERRLA,  No xanthomas.   Normal cephalic and atramatic  Lungs: Clear bilaterally to auscultation and percussion. Heart: HRRR S1 S2, without MRG.  Pulses are 2+ & equal.            No carotid bruit. No JVD.  No abdominal bruits. No femoral bruits. Abdomen: Bowel sounds are positive, abdomen soft and non-tender without masses or                  Hernia's noted. Msk:  Back normal, normal gait. Normal strength and tone for age. Extremities: No clubbing, cyanosis or edema.  DP +1 Neuro: Alert and oriented X 3. Psych:  Good affect, responds appropriately   EKG:NSR with left axis deviation. Inferior and lateral T-wave inversion  ASSESSMENT AND PLAN

## 2012-12-05 NOTE — Assessment & Plan Note (Signed)
Fasting lipids and LFT's in one month.

## 2012-12-05 NOTE — Progress Notes (Deleted)
Name: Antonio Cobb    DOB: 1961/07/26  Age: 51 y.o.  MR#: [id number]       PCP:  Syliva Overman, MD      Insurance: @PAYORNAME @   CC:   No chief complaint on file.   VS BP 120/89  Pulse 88  Ht 5\' 10"  (1.778 m)  Wt 206 lb 4 oz (93.554 kg)  BMI 29.59 kg/m2  SpO2 96%  Weights Current Weight  12/05/12 206 lb 4 oz (93.554 kg)  09/06/12 203 lb (92.08 kg)  07/28/12 199 lb 11.8 oz (90.6 kg)    Blood Pressure  BP Readings from Last 3 Encounters:  12/05/12 120/89  09/06/12 120/82  07/28/12 130/80     Admit date:  (Not on file) Last encounter with RMR:  11/24/2012   Allergy No Known Allergies  Current Outpatient Prescriptions  Medication Sig Dispense Refill  . albuterol (PROVENTIL HFA;VENTOLIN HFA) 108 (90 BASE) MCG/ACT inhaler Inhale 2 puffs into the lungs every 6 (six) hours as needed. For shortness of breath      . amLODipine (NORVASC) 10 MG tablet take 1 tablet once daily  30 tablet  3  . aspirin (ASPIRIN ADULT LOW STRENGTH) 81 MG EC tablet Take 81 mg by mouth daily.        [name] atorvastatin (LIPITOR) 40 MG tablet Take 1 tablet (40 mg total) by mouth daily at 6 PM.  30 tablet  3  . glucose blood test strip Use as instructed for three times daily testing DX 250.00  100 each  12  . hydrochlorothiazide (HYDRODIURIL) 25 MG tablet take 1 tablet once daily  30 tablet  3  . insulin glargine (LANTUS SOLOSTAR) 100 UNIT/ML injection Inject 30 Units into the skin at bedtime.      . irbesartan (AVAPRO) 300 MG tablet Take 1 tablet (300 mg total) by mouth daily.  90 tablet  0  . metoprolol succinate (TOPROL-XL) 50 MG 24 hr tablet Take 1 tablet (50 mg total) by mouth 2 (two) times daily. Take with or immediately following a meal.  60 tablet  3  . nitroGLYCERIN (NITROSTAT) 0.4 MG SL tablet Place 1 tablet (0.4 mg total) under the tongue every 5 (five) minutes as needed for chest pain.  25 tablet  3  . Omega-3 Fatty Acids (FISH OIL) 1000 MG CAPS Take by mouth daily.        . prasugrel  (EFFIENT) 10 MG TABS Take 1 tablet (10 mg total) by mouth daily.  30 tablet  11  . Saxagliptin-Metformin 2.04-999 MG TB24 Take 2 tablets by mouth daily.  30 tablet    . spironolactone (ALDACTONE) 25 MG tablet take 1 tablet once daily  30 tablet  3    Discontinued Meds:   There are no discontinued medications.  Patient Active Problem List  Diagnosis  . DM (diabetes mellitus), type 2, uncontrolled  . HYPERLIPIDEMIA  . OVERWEIGHT  . NICOTINE ADDICTION  . HYPERTENSION  . OTHER CHRONIC BRONCHITIS  . ED (erectile dysfunction)  . STEMI (ST elevation myocardial infarction)  . NICM (nonischemic cardiomyopathy)  . Tobacco abuse  . Alcohol abuse  . CAD (coronary artery disease)    LABS Hospital Outpatient Visit on 11/02/2012  Component Date Value  . Glucose-Capillary 11/02/2012 [facility] Outpatient Visit on 10/31/2012  Component Date Value  . Glucose-Capillary 10/31/2012 236*  Hospital Outpatient Visit on 10/28/2012  Component Date Value  . Glucose-Capillary 10/28/2012 [facility] Outpatient Visit on 10/26/2012  Component Date  Value  . Glucose-Capillary 10/26/2012 [facility] Outpatient Visit on 10/24/2012  Component Date Value  . Glucose-Capillary 10/24/2012 221*  Hospital Outpatient Visit on 10/21/2012  Component Date Value  . Glucose-Capillary 10/21/2012 119*  Hospital Outpatient Visit on 10/19/2012  Component Date Value  . Glucose-Capillary 10/19/2012 195*  Hospital Outpatient Visit on 10/10/2012  Component Date Value  . Glucose-Capillary 10/10/2012 98   . Glucose-Capillary 10/10/2012 113*  Hospital Outpatient Visit on 10/07/2012  Component Date Value  . Glucose-Capillary 10/07/2012 163*  Hospital Outpatient Visit on 10/05/2012  Component Date Value  . Glucose-Capillary 10/05/2012 116*  There may be more visits with results that are not included.   Results for this Opt Visit:     Results for orders placed during the hospital encounter of 11/02/12   GLUCOSE, CAPILLARY      Component Value Range   Glucose-Capillary 171 (*) 70 - 99 mg/dL    EKG Orders placed in visit on 12/05/12  . EKG 12-LEAD     Prior Assessment and Plan Problem List as of 12/05/2012          DM (diabetes mellitus), type 2, uncontrolled   Last Assessment & Plan Note   09/06/2012 Office Visit Signed 09/20/2012  6:20 PM by Kerri Perches, MD    Endocrinology caring for pt, remains uncontrolled at this time. Pt states he has noted now that he is in cardiac rehab that when he drinks alcohol, which is very often, his blood sugar is high    HYPERLIPIDEMIA   Last Assessment & Plan Note   09/06/2012 Office Visit Signed 09/20/2012  6:19 PM by Kerri Perches, MD    Uncontrolled, generally non compliant both with medication as well as diet. Trying to change both Lab prior to next visit    OVERWEIGHT   Last Assessment & Plan Note   09/06/2012 Office Visit Signed 09/20/2012  6:20 PM by Kerri Perches, MD    unchanged. Patient re-educated about  the importance of commitment to a  minimum of 150 minutes of exercise per week. The importance of healthy food choices with portion control discussed. Encouraged to start a food diary, count calories and to consider  joining a support group. Sample diet sheets offered. Goals set by the patient for the next several months.       NICOTINE ADDICTION   Last Assessment & Plan Note   09/06/2012 Office Visit Signed 09/20/2012  6:18 PM by Kerri Perches, MD    Patient counseled for approximately 5 minutes regarding the health risks of ongoing nicotine use, specifically all types of cancer, heart disease, stroke and respiratory failure. The options available for help with cessation ,the behavioral changes to assist the process, and the option to either gradully reduce usage  Or abruptly stop.is also discussed. Pt is also encouraged to set specific goals in number of cigarettes used daily, as well as to set a quit  date. Refer for chest CT scan    HYPERTENSION   Last Assessment & Plan Note   09/06/2012 Office Visit Signed 09/20/2012  6:18 PM by Kerri Perches, MD    Controlled, no change in medication DASH diet and commitment to daily physical activity for a minimum of 30 minutes discussed and encouraged, as a part of hypertension management. The importance of attaining a healthy weight is also discussed.     OTHER CHRONIC BRONCHITIS   ED (erectile dysfunction)   Last Assessment & Plan Note   05/10/2012 Office  Visit Signed 05/15/2012  9:59 AM by Kerri Perches, MD    Deterioration in performance, requests medication trial, wife present and nbi agreement    STEMI (ST elevation myocardial infarction)   NICM (nonischemic cardiomyopathy)   Tobacco abuse   Alcohol abuse   Last Assessment & Plan Note   09/06/2012 Office Visit Signed 09/20/2012  6:21 PM by Kerri Perches, MD    An ongoing problem, no interest in group therapy, but cutting back per pt and spouse report    CAD (coronary artery disease)   Last Assessment & Plan Note   05/31/2012 Office Visit Signed 05/31/2012  4:56 PM by Jodelle Gross, NP    He is doing well and is back to his normal energy level. He experienced severe heartburn symptoms with associated pressure at that time. He has had one incidence of GERD symptoms which caused him to take one NTG.   I have prescribed protonix 40 mg daily for GERD symptoms as he is on medications that can exacerbate this. He will continue current medications, especially Effient for at least one year. This has been explained to him. I have referred him to cardiac rehab for assessment, as he work place will not allow him to return to work until he is able to be 100% of capacity.         Imaging: No results found.   FRS Calculation: Score not calculated. Missing: Total Cholesterol

## 2012-12-05 NOTE — Assessment & Plan Note (Signed)
Excellent control of BP. Refills provided on Avapro.

## 2012-12-05 NOTE — Telephone Encounter (Signed)
Pt was seen in office today and verified he no longer goes to Cardiac rehab where MD Arguello was located, pt medications were reviewed and addressed by KL at office visit today, pt understood all instructions given, refill for Nitro and Avapro sent to pt pharmacy today per KL, all other medications were updated/reconciled in pt chart

## 2012-12-05 NOTE — Patient Instructions (Addendum)
Your physician recommends that you schedule a follow-up appointment in: 6 MONTHS KL/RR   Your physician recommends that you return for lab work in: ONE MONTH (LIPIDS, LFTS) SLIPS TO BE FAXED

## 2012-12-05 NOTE — Assessment & Plan Note (Signed)
He is doing well without complaints. 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 He will continue on DAPT for another 6 months, although [facility] recommends platelet inhibition for 6 months with DES, have discussed this with the patient. He wishes to continue Effient for one year. Will discontinue on next appointment.

## 2012-12-12 ENCOUNTER — Other Ambulatory Visit: Payer: Self-pay | Admitting: *Deleted

## 2012-12-12 MED ORDER — ATORVASTATIN CALCIUM 40 MG PO TABS: 40.0000 mg | ORAL_TABLET | Freq: Every day | ORAL | Status: DC

## 2012-12-27 ENCOUNTER — Ambulatory Visit (INDEPENDENT_AMBULATORY_CARE_PROVIDER_SITE_OTHER): Payer: 59 | Admitting: Family Medicine

## 2012-12-27 ENCOUNTER — Encounter: Payer: Self-pay | Admitting: Family Medicine

## 2012-12-27 VITALS — BP 140/90 | HR 92 | Resp 16 | Ht 70.0 in | Wt 206.0 lb

## 2012-12-27 DIAGNOSIS — E785 Hyperlipidemia, unspecified: Secondary | ICD-10-CM

## 2012-12-27 DIAGNOSIS — Z0000 Encounter for general adult medical examination without abnormal findings: Secondary | ICD-10-CM

## 2012-12-27 DIAGNOSIS — F1010 Alcohol abuse, uncomplicated: Secondary | ICD-10-CM

## 2012-12-27 DIAGNOSIS — Z720 Tobacco use: Secondary | ICD-10-CM

## 2012-12-27 DIAGNOSIS — E1165 Type 2 diabetes mellitus with hyperglycemia: Secondary | ICD-10-CM

## 2012-12-27 DIAGNOSIS — E663 Overweight: Secondary | ICD-10-CM

## 2012-12-27 DIAGNOSIS — Z125 Encounter for screening for malignant neoplasm of prostate: Secondary | ICD-10-CM

## 2012-12-27 DIAGNOSIS — Z1329 Encounter for screening for other suspected endocrine disorder: Secondary | ICD-10-CM

## 2012-12-27 DIAGNOSIS — F17200 Nicotine dependence, unspecified, uncomplicated: Secondary | ICD-10-CM

## 2012-12-27 NOTE — Patient Instructions (Addendum)
F/u in 4.5 month, please call if you need me before.  Fasting lipid, cmp and EGFr, TSH and PSA as soon as possible  Keep appt with Dr Fransico Him  Check with referral staff for chest CT scan which has been authorized to ensure you have no lung nodules , due to longstanding smoking history.  You need to stop cigars.  You need to stop alcohol  You need to commit to exercise at least 5 days per week.  If you work at each of these you will improve and get healthier

## 2012-12-28 MED ORDER — HYDROCHLOROTHIAZIDE 25 MG PO TABS: ORAL_TABLET | ORAL | Status: DC

## 2012-12-28 MED ORDER — SAXAGLIPTIN-METFORMIN ER 2.5-1000 MG PO TB24: 2.0000 | ORAL_TABLET | Freq: Every day | ORAL | Status: DC

## 2012-12-30 ENCOUNTER — Other Ambulatory Visit: Payer: Self-pay | Admitting: *Deleted

## 2012-12-30 ENCOUNTER — Encounter: Payer: Self-pay | Admitting: *Deleted

## 2012-12-30 ENCOUNTER — Ambulatory Visit (HOSPITAL_COMMUNITY)
Admission: RE | Admit: 2012-12-30 | Discharge: 2012-12-30 | Disposition: A | Discharge status: Home / Self Care | Payer: 59 | Source: Ambulatory Visit | Attending: Family Medicine | Admitting: Family Medicine

## 2012-12-30 DIAGNOSIS — I10 Essential (primary) hypertension: Secondary | ICD-10-CM

## 2012-12-30 DIAGNOSIS — F17200 Nicotine dependence, unspecified, uncomplicated: Secondary | ICD-10-CM

## 2012-12-30 DIAGNOSIS — E785 Hyperlipidemia, unspecified: Secondary | ICD-10-CM

## 2012-12-30 LAB — COMPLETE METABOLIC PANEL WITH GFR
ALT: 30 U/L (ref 0–53)
AST: 22 U/L (ref 0–37)
Albumin: 4.6 g/dL (ref 3.5–5.2)
Alkaline Phosphatase: 80 U/L (ref 39–117)
BUN: 20 mg/dL (ref 6–23)
Potassium: 3.9 mEq/L (ref 3.5–5.3)
Sodium: 140 mEq/L (ref 135–145)
Total Protein: 7.5 g/dL (ref 6.0–8.3)

## 2012-12-30 LAB — PSA: PSA: 0.41 ng/mL (ref <=4.00)

## 2012-12-30 LAB — LIPID PANEL
Cholesterol: 174 mg/dL (ref 0–200)
LDL Cholesterol: 58 mg/dL (ref 0–99)
Total CHOL/HDL Ratio: 4.7 Ratio
VLDL: 79 mg/dL — ABNORMAL HIGH (ref 0–40)

## 2012-12-31 DIAGNOSIS — Z0000 Encounter for general adult medical examination without abnormal findings: Secondary | ICD-10-CM | POA: Insufficient documentation

## 2012-12-31 NOTE — Assessment & Plan Note (Signed)
Unchanged. Patient re-educated about  the importance of commitment to a  minimum of 150 minutes of exercise per week. The importance of healthy food choices with portion control discussed. Encouraged to start a food diary, count calories and to consider  joining a support group. Sample diet sheets offered. Goals set by the patient for the next several months.    

## 2012-12-31 NOTE — Assessment & Plan Note (Signed)
Reports reduced use, counseled to quit

## 2012-12-31 NOTE — Progress Notes (Signed)
  Subjective:    Patient ID: Antonio Cobb, male    DOB: 1961-09-09, 52 y.o.   MRN: [id number]  HPI The PT is here for annual exam and re-evaluation of chronic medical conditions, medication management and review of any available recent lab and radiology data.  Preventive health is updated, specifically  Cancer screening and Immunization.   Questions or concerns regarding consultations or procedures which the PT has had in the interim are  addressed. The PT denies any adverse reactions to current medications since the last visit.  There are no new concerns. Reports reduced nicotine and alcohol use still no quit date set for either, needs to quit both There are no specific complaints       Review of Systems See HPI Denies recent fever or chills. Denies sinus pressure, nasal congestion, ear pain or sore throat. Denies chest congestion, productive cough or wheezing. Denies chest pains, palpitations and leg swelling Denies abdominal pain, nausea, vomiting,diarrhea or constipation.   Denies dysuria, frequency, hesitancy or incontinence. Denies joint pain, swelling and limitation in mobility. Denies headaches, seizures, numbness, or tingling. Denies depression, anxiety or insomnia. Denies skin break down or rash.        Objective:   Physical Exam  Pleasant well nourished male, alert and oriented x 3, in no cardio-pulmonary distress. Afebrile. HEENT No facial trauma or asymetry. Sinuses non tender. EOMI, PERTL, fundoscopic exam is negative for hemorhages or exudates. External ears normal, tympanic membranes clear. Oropharynx moist, no exudate, good dentition. Neck: supple, no adenopathy,JVD or thyromegaly.No bruits.  Chest: Clear to ascultation bilaterally.No crackles or wheezes. Non tender to palpation, decreased air entry throughout  Breast: No asymetry,no masses. No nipple discharge or inversion. No axillary or supraclavicular adenopathy  Cardiovascular  system; Heart sounds normal,  S1 and  S2 ,no S3.  No murmur, or thrill. Apical beat not displaced Peripheral pulses normal.  Abdomen: Soft, non tender, no organomegaly or masses. No bruits. Bowel sounds normal. No guarding, tenderness or rebound.  Rectal:  No mass. guaiac negative stool. Prostate smooth and firm, not enlarged, no palpable nodule   Musculoskeletal exam: Full ROM of spine, hips , shoulders and knees. No deformity ,swelling or crepitus noted. No muscle wasting or atrophy.   Neurologic: Cranial nerves 2 to 12 intact. Power, tone ,sensation and reflexes normal throughout. No disturbance in gait. No tremor.  Skin: Intact, no ulceration, erythema , scaling or rash noted. Pigmentation normal throughout  Psych; Normal mood and affect. Judgement and concentration normal        Assessment & Plan:

## 2012-12-31 NOTE — Assessment & Plan Note (Signed)
Annual exam performed as documented. The importance of low fat diet , with carb restriction stressed. The importance of commitment to daily physical activity stressed. The importance of cessation of nicotine use stressed Updated labs at visit

## 2012-12-31 NOTE — Assessment & Plan Note (Signed)
Reduced use, primarily cigars reportedly, needs to quit has cAd and IDDM Patient counseled for approximately 5 minutes regarding the health risks of ongoing nicotine use, specifically all types of cancer, heart disease, stroke and respiratory failure. The options available for help with cessation ,the behavioral changes to assist the process, and the option to either gradully reduce usage  Or abruptly stop.is also discussed. Pt is also encouraged to set specific goals in number of cigarettes used daily, as well as to set a quit date.

## 2012-12-31 NOTE — Assessment & Plan Note (Signed)
Uncontrolled. Hyperlipidemia:Low fat diet discussed and encouraged.  Updated lab at this visit

## 2013-01-04 NOTE — Addendum Note (Signed)
Addended by: Kandis Fantasia B on: 01/04/2013 04:58 PM   Modules accepted: Orders

## 2013-01-06 NOTE — Progress Notes (Signed)
Cardiac Rehabilitation Program Outcomes Report   Orientation:  07/23/201 Graduate Date: transffering to [facility] Discharge Date:  07/15/2012 # of sessions completed: 3 DX MI and Stent  Cardiologist: Dietrich Pates Family MD:  Syliva Overman Class Time:  11:00  A.  Exercise Program:  Tolerates exercise @ 4.46 METS for 15 minutes and Walk Test Results:  Pre: Pre Walk Test: Resting HR 71, BP 110/80, O2 100%, RPE 6 and RPD 6., 6 minute HR 78, BP 160/90, O2 100%, RPE 6 and RPD 8, Post HR 61,  BP 130/90, O2 100, RPE 6 and RPD 8. walked 1400 ft at 2.65 mph.  B.  Mental Health:  Good mental attitude  C.  Education/Instruction/Skills  Accurately checks own pulse.  Rest:  65  Exercise: 109, Knows THR for exercise and Uses Perceived Exertion Scale and/or Dyspnea Scale  Uses Perceived Exertion Scale and/or Dyspnea Scale  D.  Nutrition/Weight Control/Body Composition:  Adherence to prescribed nutrition program: good    E.  Blood Lipids    Lab Results  Component Value Date   CHOL 174 12/30/2012   HDL 37* 12/30/2012   LDLCALC 58 12/30/2012   LDLDIRECT TEST NOT PERFORMED 06/19/2011   TRIG 396* 12/30/2012   CHOLHDL 4.7 12/30/2012    F.  Lifestyle Changes:  Making positive lifestyle changes  G.  Symptoms noted with exercise:  Asymptomatic  Report Completed By:  Lelon Huh. Tehillah Cipriani RN   Comments:  This is patients 1 st week Report. He achieved a peak METS of 4.46. His resting HR was 65 and resting BP was 138/90, His peak HR was 109 and His peak BP was 140/70. This patient is transffering to [facility]. Due to his work,

## 2013-01-24 ENCOUNTER — Encounter: Payer: Self-pay | Admitting: Cardiology

## 2013-03-03 ENCOUNTER — Other Ambulatory Visit: Payer: Self-pay | Admitting: Family Medicine

## 2013-04-11 ENCOUNTER — Other Ambulatory Visit: Payer: Self-pay | Admitting: Family Medicine

## 2013-05-10 ENCOUNTER — Encounter: Payer: Self-pay | Admitting: Family Medicine

## 2013-05-10 ENCOUNTER — Ambulatory Visit (INDEPENDENT_AMBULATORY_CARE_PROVIDER_SITE_OTHER): Payer: 59 | Admitting: Family Medicine

## 2013-05-10 VITALS — BP 128/84 | HR 78 | Resp 18 | Ht 70.0 in | Wt 206.1 lb

## 2013-05-10 DIAGNOSIS — F17200 Nicotine dependence, unspecified, uncomplicated: Secondary | ICD-10-CM

## 2013-05-10 DIAGNOSIS — I10 Essential (primary) hypertension: Secondary | ICD-10-CM

## 2013-05-10 DIAGNOSIS — R059 Cough, unspecified: Secondary | ICD-10-CM | POA: Insufficient documentation

## 2013-05-10 DIAGNOSIS — E1165 Type 2 diabetes mellitus with hyperglycemia: Secondary | ICD-10-CM

## 2013-05-10 DIAGNOSIS — R05 Cough: Secondary | ICD-10-CM

## 2013-05-10 DIAGNOSIS — Z720 Tobacco use: Secondary | ICD-10-CM

## 2013-05-10 DIAGNOSIS — F1010 Alcohol abuse, uncomplicated: Secondary | ICD-10-CM

## 2013-05-10 DIAGNOSIS — E785 Hyperlipidemia, unspecified: Secondary | ICD-10-CM

## 2013-05-10 MED ORDER — ALBUTEROL SULFATE HFA 108 (90 BASE) MCG/ACT IN AERS: INHALATION_SPRAY | RESPIRATORY_TRACT | Status: DC

## 2013-05-10 NOTE — Progress Notes (Signed)
  Subjective:    Patient ID: Antonio Cobb, male    DOB: Jul 05, 1961, 52 y.o.   MRN: [id number]  HPI The PT is here for follow up and re-evaluation of chronic medical conditions, medication management and review of any available recent lab and radiology data.  Preventive health is updated, specifically  Cancer screening and Immunization.   Questions or concerns regarding consultations or procedures which the PT has had in the interim are  Addressed.Currently doing zumaba, feels better, states he still knows that alcohol dependence is a MAJOR problem he needs to get a hold of, less concerned about the nicotine use which is markedly reduced but still present, he is advise of the need to quit.  The PT denies any adverse reactions to current medications since the last visit.  There are no new concerns.  There are no specific complaints  Blood suagr control is not as good as it was but he is working aggressively to change this, he is followed by endo      Review of Systems See HPI Denies recent fever or chills. Denies sinus pressure, nasal congestion, ear pain or sore throat. Denies chest congestion, productive cough c/o wheezing and cough with exercise, has benefited form proventil in the past and requests this for use before exercise Denies chest pains, palpitations and leg swelling Denies abdominal pain, nausea, vomiting,diarrhea or constipation.   Denies dysuria, frequency, hesitancy or incontinence. Denies joint pain, swelling and limitation in mobility. Denies headaches, seizures, numbness, or tingling. Denies depression, anxiety or insomnia. Denies skin break down or rash.        Objective:   Physical Exam  Patient alert and oriented and in no cardiopulmonary distress.  HEENT: No facial asymmetry, EOMI, no sinus tenderness,  oropharynx pink and moist.  Neck supple no adenopathy.  Chest: Clear to auscultation bilaterally.  CVS: S1, S2 no murmurs, no S3.  ABD: Soft non  tender. Bowel sounds normal.  Ext: No edema  MS: Adequate ROM spine, shoulders, hips and knees.  Skin: Intact, no ulcerations or rash noted.  Psych: Good eye contact, normal affect. Memory intact not anxious or depressed appearing.  CNS: CN 2-12 intact, power, tone and sensation normal throughout.       Assessment & Plan:

## 2013-05-10 NOTE — Patient Instructions (Addendum)
F/u in 4.5 month, call if you need me before   Fasting lipid, cmp and EGFR in 4.5 month    You need to quit all alcohol

## 2013-05-14 NOTE — Assessment & Plan Note (Signed)
Remains uncontrolled with nephropathy, followed bu endo,  Patient advised to reduce carb and sweets, commit to regular physical activity, take meds as prescribed, test blood as directed, and attempt to lose weight, to improve blood sugar control.

## 2013-05-14 NOTE — Assessment & Plan Note (Signed)
H/o exercise induce cough, proventil prescribed for use before exercise

## 2013-05-14 NOTE — Assessment & Plan Note (Signed)
Controlled, no change in medication DASH diet and commitment to daily physical activity for a minimum of 30 minutes discussed and encouraged, as a part of hypertension management. The importance of attaining a healthy weight is also discussed.  

## 2013-05-14 NOTE — Assessment & Plan Note (Signed)
Remains in deniial as to the seriousness of his addiction problem unwiiling to go to AA, counseled of the need, wife present at visit

## 2013-05-14 NOTE — Assessment & Plan Note (Signed)
Reduced but ongoin, needs to quit Patient counseled for approximately 5 minutes regarding the health risks of ongoing nicotine use, specifically all types of cancer, heart disease, stroke and respiratory failure. The options available for help with cessation ,the behavioral changes to assist the process, and the option to either gradully reduce usage  Or abruptly stop.is also discussed. Pt is also encouraged to set specific goals in number of cigarettes used daily, as well as to set a quit date.

## 2013-05-14 NOTE — Assessment & Plan Note (Signed)
triuglyceride elevated, needs o reduce cheese, red meat and butter, no med change

## 2013-05-18 ENCOUNTER — Other Ambulatory Visit: Payer: Self-pay | Admitting: Cardiology

## 2013-07-31 ENCOUNTER — Other Ambulatory Visit: Payer: Self-pay | Admitting: Family Medicine

## 2013-08-11 ENCOUNTER — Other Ambulatory Visit: Payer: Self-pay

## 2013-08-11 MED ORDER — IRBESARTAN 300 MG PO TABS: 300.0000 mg | ORAL_TABLET | Freq: Every day | ORAL | Status: DC

## 2013-08-21 ENCOUNTER — Other Ambulatory Visit: Payer: Self-pay | Admitting: Family Medicine

## 2013-09-04 ENCOUNTER — Other Ambulatory Visit: Payer: Self-pay | Admitting: *Deleted

## 2013-09-04 ENCOUNTER — Encounter: Payer: Self-pay | Admitting: Family Medicine

## 2013-09-04 ENCOUNTER — Other Ambulatory Visit: Payer: Self-pay | Admitting: Family Medicine

## 2013-09-04 MED ORDER — ATORVASTATIN CALCIUM 40 MG PO TABS: 40.0000 mg | ORAL_TABLET | Freq: Every day | ORAL | Status: DC

## 2013-09-05 LAB — COMPLETE METABOLIC PANEL WITH GFR
ALT: 36 U/L (ref 0–53)
Albumin: 4.3 g/dL (ref 3.5–5.2)
CO2: 26 mEq/L (ref 19–32)
Calcium: 9.7 mg/dL (ref 8.4–10.5)
Chloride: 100 mEq/L (ref 96–112)
Creat: 1.59 mg/dL — ABNORMAL HIGH (ref 0.50–1.35)
GFR, Est African American: 56 mL/min — ABNORMAL LOW
Potassium: 3.9 mEq/L (ref 3.5–5.3)
Sodium: 136 mEq/L (ref 135–145)
Total Protein: 7.3 g/dL (ref 6.0–8.3)

## 2013-09-05 LAB — CBC
MCH: 29.2 pg (ref 26.0–34.0)
MCHC: 34.6 g/dL (ref 30.0–36.0)
Platelets: 288 10*3/uL (ref 150–400)
RDW: 13.6 % (ref 11.5–15.5)

## 2013-09-05 LAB — LIPID PANEL: Cholesterol: 181 mg/dL (ref 0–200)

## 2013-09-05 LAB — HEMOGLOBIN A1C
Hgb A1c MFr Bld: 10.9 % — ABNORMAL HIGH (ref <5.7)
Mean Plasma Glucose: 266 mg/dL — ABNORMAL HIGH (ref <117)

## 2013-09-27 ENCOUNTER — Ambulatory Visit: Payer: 59 | Admitting: Family Medicine

## 2013-09-28 ENCOUNTER — Other Ambulatory Visit: Payer: Self-pay | Admitting: Family Medicine

## 2013-09-29 ENCOUNTER — Ambulatory Visit (INDEPENDENT_AMBULATORY_CARE_PROVIDER_SITE_OTHER): Payer: 59 | Admitting: Family Medicine

## 2013-09-29 ENCOUNTER — Encounter: Payer: Self-pay | Admitting: Family Medicine

## 2013-09-29 ENCOUNTER — Ambulatory Visit (HOSPITAL_COMMUNITY)
Admission: RE | Admit: 2013-09-29 | Discharge: 2013-09-29 | Disposition: A | Discharge status: Home / Self Care | Payer: 59 | Source: Ambulatory Visit | Attending: Family Medicine | Admitting: Family Medicine

## 2013-09-29 ENCOUNTER — Encounter (INDEPENDENT_AMBULATORY_CARE_PROVIDER_SITE_OTHER): Payer: Self-pay

## 2013-09-29 VITALS — BP 150/94 | HR 89 | Temp 98.8°F | Resp 16 | Ht 70.0 in | Wt 204.1 lb

## 2013-09-29 DIAGNOSIS — F17200 Nicotine dependence, unspecified, uncomplicated: Secondary | ICD-10-CM

## 2013-09-29 DIAGNOSIS — J209 Acute bronchitis, unspecified: Secondary | ICD-10-CM

## 2013-09-29 DIAGNOSIS — Z720 Tobacco use: Secondary | ICD-10-CM

## 2013-09-29 DIAGNOSIS — Z125 Encounter for screening for malignant neoplasm of prostate: Secondary | ICD-10-CM

## 2013-09-29 DIAGNOSIS — E1065 Type 1 diabetes mellitus with hyperglycemia: Secondary | ICD-10-CM

## 2013-09-29 DIAGNOSIS — R059 Cough, unspecified: Secondary | ICD-10-CM | POA: Insufficient documentation

## 2013-09-29 DIAGNOSIS — I10 Essential (primary) hypertension: Secondary | ICD-10-CM

## 2013-09-29 DIAGNOSIS — IMO0001 Reserved for inherently not codable concepts without codable children: Secondary | ICD-10-CM

## 2013-09-29 DIAGNOSIS — F1010 Alcohol abuse, uncomplicated: Secondary | ICD-10-CM

## 2013-09-29 DIAGNOSIS — R05 Cough: Secondary | ICD-10-CM | POA: Insufficient documentation

## 2013-09-29 DIAGNOSIS — E785 Hyperlipidemia, unspecified: Secondary | ICD-10-CM

## 2013-09-29 MED ORDER — AMOXICILLIN-POT CLAVULANATE 500-125 MG PO TABS: 1.0000 | ORAL_TABLET | Freq: Three times a day (TID) | ORAL | Status: AC

## 2013-09-29 MED ORDER — CEFTRIAXONE SODIUM 500 MG IJ SOLR
500.0000 mg | Freq: Once | INTRAMUSCULAR | Status: AC
  Administered 2013-09-29: 500 mg via INTRAMUSCULAR

## 2013-09-29 MED ORDER — IPRATROPIUM BROMIDE 0.02 % IN SOLN
0.5000 mg | Freq: Once | RESPIRATORY_TRACT | Status: AC
  Administered 2013-09-29: 0.5 mg via RESPIRATORY_TRACT

## 2013-09-29 MED ORDER — ALBUTEROL SULFATE (2.5 MG/3ML) 0.083% IN NEBU
2.5000 mg | INHALATION_SOLUTION | Freq: Once | RESPIRATORY_TRACT | Status: AC
  Administered 2013-09-29: 2.5 mg via RESPIRATORY_TRACT

## 2013-09-29 MED ORDER — BENZONATATE 100 MG PO CAPS: 100.0000 mg | ORAL_CAPSULE | Freq: Four times a day (QID) | ORAL | Status: DC | PRN

## 2013-09-29 NOTE — Assessment & Plan Note (Signed)
Ongoing use despite established CAD Patient counseled for approximately 5 minutes regarding the health risks of ongoing nicotine use, specifically all types of cancer, heart disease, stroke and respiratory failure. The options available for help with cessation ,the behavioral changes to assist the process, and the option to either gradully reduce usage  Or abruptly stop.is also discussed. Pt is also encouraged to set specific goals in number of cigarettes used daily, as well as to set a quit date.

## 2013-09-29 NOTE — Assessment & Plan Note (Signed)
Uncontrolled, pt advised of the importance of compliance with medication and diet DASH diet and commitment to daily physical activity for a minimum of 30 minutes discussed and encouraged, as a part of hypertension management. The importance of attaining a healthy weight is also discussed.

## 2013-09-29 NOTE — Progress Notes (Signed)
  Subjective:    Patient ID: Antonio Cobb, male    DOB: 1961/07/22, 52 y.o.   MRN: [id number]  HPI  2 week h/o increased chest congestion, cough yellow and green sputum, chest tightness , fatigue. No nasal drainage, intermittent chills and wheezing Denies facial pain, nasal drainage , ear pain or sore throat. Pt also here to review chronic health conditions. He has been eating an excessive amount of cheese, not exercising as often as in the past, and continues to booth smoke and drink, though he insists to a lesser degree. States it was sobering when he realized that his younger son who is an uncontrolled diabetic had also started drinking Plans to get eye exam before year end  Review of Systems See HPI  Denies chest pains, palpitations and leg swelling Denies abdominal pain, nausea, vomiting,diarrhea or constipation.   Denies dysuria, frequency, hesitancy or incontinence. Denies joint pain, swelling and limitation in mobility. Denies headaches, seizures, numbness, or tingling. Denies depression, anxiety or insomnia. Denies skin break down or rash.        Objective:   Physical Exam  Patient alert and oriented and in no cardiopulmonary distress.Ill appearing  HEENT: No facial asymmetry, EOMI, no sinus tenderness,  oropharynx pink and moist.  Neck supple no adenopathy.  Chest: Decreased air entry, scattered crackles and wheezes  CVS: S1, S2 no murmurs, no S3.  ABD: Soft non tender. Bowel sounds normal.  Ext: No edema  MS: Adequate ROM spine, shoulders, hips and knees.  Skin: Intact, no ulcerations or rash noted.  Psych: Good eye contact, normal affect. Memory intact not anxious or depressed appearing.  CNS: CN 2-12 intact, power, tone and sensation normal throughout.       Assessment & Plan:

## 2013-09-29 NOTE — Patient Instructions (Addendum)
F/u   January 23 or after, call if you need me before  Nurse visit in 3 weeks for flu vaccine  You need to schedule an appointment for an eye exam  You have acute bronchitis. You will receive a breathing treatment in the office and Rocephin 500mg  IM CXR today please Decongestant and antibiotic augmentin are both sent to your pharmacy, it is important that you take the entire course of both  You need to stop both alcohol and cigarettes for improved health, so keep working on both. Your triglycerides are extremely high, so you ABSOLUTELY need to reduce cheese , fried and fatty foods and red meat  Fasting lipid cmp and EGFR , PSA after January 20  Blood pressure is high today, please take medication as prescribed every day

## 2013-09-29 NOTE — Assessment & Plan Note (Signed)
counseled re need to stop drinking. States he is making a more serious effort since he now sees his ill younger son drinking Still no interest in [place] , though for the first time he called himself an alcoholic

## 2013-09-29 NOTE — Assessment & Plan Note (Signed)
Followed by endo Remains uncontrolled Patient educated about the importance of limiting  Carbohydrate intake , the need to commit to daily physical activity for a minimum of 30 minutes , and to commit weight loss. The fact that changes in all these areas will reduce or eliminate all together the development of diabetes is stressed.   Importance of eye exam stressed  also

## 2013-09-29 NOTE — Assessment & Plan Note (Signed)
Uncontrolled, marked elevation in triglycerides.Cholesterol is actually controlled Pt reports excessive cheese intake Hyperlipidemia:Low fat diet discussed and encouraged. No med change, dietary change only

## 2013-09-29 NOTE — Assessment & Plan Note (Addendum)
2 week h/o worsening symptoms Neb x 1  Rocephin 500mg  IM

## 2013-11-06 ENCOUNTER — Telehealth: Payer: Self-pay | Admitting: *Deleted

## 2013-11-06 ENCOUNTER — Other Ambulatory Visit: Payer: Self-pay | Admitting: Family Medicine

## 2013-11-06 MED ORDER — ATORVASTATIN CALCIUM 40 MG PO TABS: 40.0000 mg | ORAL_TABLET | Freq: Every day | ORAL | Status: DC

## 2013-11-06 NOTE — Telephone Encounter (Signed)
Faxed rx refill for atorvastatin 40 mg #[phone] f [phone]  From [facility]

## 2013-11-06 NOTE — Telephone Encounter (Signed)
Medication sent via escribe.  

## 2014-01-02 ENCOUNTER — Other Ambulatory Visit: Payer: Self-pay | Admitting: Family Medicine

## 2014-01-10 ENCOUNTER — Ambulatory Visit: Payer: 59 | Admitting: Family Medicine

## 2014-01-14 ENCOUNTER — Emergency Department (HOSPITAL_COMMUNITY): Payer: 59

## 2014-01-14 ENCOUNTER — Emergency Department (HOSPITAL_COMMUNITY)
Admission: EM | Admit: 2014-01-14 | Discharge: 2014-01-14 | Disposition: A | Discharge status: Home / Self Care | Payer: 59 | Attending: Emergency Medicine | Admitting: Emergency Medicine

## 2014-01-14 ENCOUNTER — Encounter (HOSPITAL_COMMUNITY): Payer: Self-pay | Admitting: Emergency Medicine

## 2014-01-14 DIAGNOSIS — E7800 Pure hypercholesterolemia, unspecified: Secondary | ICD-10-CM | POA: Insufficient documentation

## 2014-01-14 DIAGNOSIS — R51 Headache: Secondary | ICD-10-CM | POA: Insufficient documentation

## 2014-01-14 DIAGNOSIS — Z794 Long term (current) use of insulin: Secondary | ICD-10-CM | POA: Insufficient documentation

## 2014-01-14 DIAGNOSIS — E785 Hyperlipidemia, unspecified: Secondary | ICD-10-CM | POA: Insufficient documentation

## 2014-01-14 DIAGNOSIS — J40 Bronchitis, not specified as acute or chronic: Secondary | ICD-10-CM | POA: Insufficient documentation

## 2014-01-14 DIAGNOSIS — Z79899 Other long term (current) drug therapy: Secondary | ICD-10-CM | POA: Insufficient documentation

## 2014-01-14 DIAGNOSIS — Z7982 Long term (current) use of aspirin: Secondary | ICD-10-CM | POA: Insufficient documentation

## 2014-01-14 DIAGNOSIS — Z9889 Other specified postprocedural states: Secondary | ICD-10-CM | POA: Insufficient documentation

## 2014-01-14 DIAGNOSIS — E669 Obesity, unspecified: Secondary | ICD-10-CM | POA: Insufficient documentation

## 2014-01-14 DIAGNOSIS — F17200 Nicotine dependence, unspecified, uncomplicated: Secondary | ICD-10-CM | POA: Insufficient documentation

## 2014-01-14 DIAGNOSIS — I252 Old myocardial infarction: Secondary | ICD-10-CM | POA: Insufficient documentation

## 2014-01-14 DIAGNOSIS — E119 Type 2 diabetes mellitus without complications: Secondary | ICD-10-CM | POA: Insufficient documentation

## 2014-01-14 DIAGNOSIS — I10 Essential (primary) hypertension: Secondary | ICD-10-CM | POA: Insufficient documentation

## 2014-01-14 MED ORDER — ALBUTEROL SULFATE (2.5 MG/3ML) 0.083% IN NEBU
5.0000 mg | INHALATION_SOLUTION | Freq: Once | RESPIRATORY_TRACT | Status: AC
  Administered 2014-01-14: 5 mg via RESPIRATORY_TRACT
  Filled 2014-01-14: qty 6

## 2014-01-14 MED ORDER — IPRATROPIUM BROMIDE 0.02 % IN SOLN
0.5000 mg | Freq: Once | RESPIRATORY_TRACT | Status: AC
  Administered 2014-01-14: 0.5 mg via RESPIRATORY_TRACT
  Filled 2014-01-14: qty 2.5

## 2014-01-14 MED ORDER — ALBUTEROL SULFATE HFA 108 (90 BASE) MCG/ACT IN AERS: 1.0000 | INHALATION_SPRAY | Freq: Four times a day (QID) | RESPIRATORY_TRACT | Status: DC | PRN

## 2014-01-14 MED ORDER — PREDNISONE 10 MG PO TABS
60.0000 mg | ORAL_TABLET | Freq: Once | ORAL | Status: AC
  Administered 2014-01-14: 05:00:00 60 mg via ORAL
  Filled 2014-01-14 (×2): qty 1

## 2014-01-14 MED ORDER — PREDNISONE 50 MG PO TABS: ORAL_TABLET | ORAL | Status: DC

## 2014-01-14 NOTE — Discharge Instructions (Signed)
Bronchitis °Bronchitis is swelling (inflammation) of the air tubes leading to your lungs (bronchi). This causes mucus and a cough. If the swelling gets bad, you may have trouble breathing. °HOME CARE  °· Rest. °· Drink enough fluids to keep your pee (urine) clear or pale yellow (unless you have a condition where you have to watch how much you drink). °· Only take medicine as told by your doctor. If you were given antibiotic medicines, finish them even if you start to feel better. °· Avoid smoke, irritating chemicals, and strong smells. These make the problem worse. Quit smoking if you smoke. This helps your lungs heal faster. °· Use a cool mist humidifier. Change the water in the humidifier every day. You can also sit in the bathroom with hot shower running for 5 10 minutes. Keep the door closed. °· See your health care provider as told. °· Wash your hands often. °GET HELP IF: °Your problems do not get better after 1 week. °GET HELP RIGHT AWAY IF:  °· Your fever gets worse. °· You have chills. °· Your chest hurts. °· Your problems breathing get worse. °· You have blood in your mucus. °· You pass out (faint). °· You feel lightheaded. °· You have a bad headache. °· You throw up (vomit) again and again. °MAKE SURE YOU: °· Understand these instructions. °· Will watch your condition. °· Will get help right away if you are not doing well or get worse. °Document Released: 05/18/2008 Document Revised: 09/20/2013 Document Reviewed: 07/25/2013 °ExitCare® Patient Information ©2014 [employer]. ° °

## 2014-01-14 NOTE — ED Notes (Signed)
Mask placed on patient for transport even though temp not greater than 100.4  Has congested cough and runny nose

## 2014-01-14 NOTE — ED Notes (Signed)
Frequent cough began two days ago, onset of vomiting today (twice).  States wife made him come because of his cough, and fever

## 2014-01-14 NOTE — ED Provider Notes (Signed)
CSN: [id number]     Arrival date & time 01/14/14  5732 History   First MD Initiated Contact with Patient 01/14/14 [phone]     Chief Complaint  Patient presents with  . Cough    Patient is a 53 y.o. male presenting with cough. The history is provided by the patient and a significant other.  Cough Severity:  Moderate Onset quality:  Gradual Duration:  2 days Timing:  Intermittent Progression:  Worsening Chronicity:  New Relieved by:  Nothing Worsened by:  Nothing tried Associated symptoms: chills, headaches, myalgias, shortness of breath and sinus congestion   Associated symptoms: no chest pain   pt reports cough and congestion for past 2 days He has had some vomiting with cough but no diarrhea No CP He does report SOB/wheezing He reports using albuterol at home without relief  Past Medical History  Diagnosis Date  . Alcohol use   . Obesity   . Nicotine addiction   . Hyperlipidemia   . Diabetes mellitus, type 2   . Hypertension   . Other and unspecified alcohol dependence, unspecified drinking behavior   . Elevated cholesterol   . Myocardial infarction 05/23/12   Past Surgical History  Procedure Laterality Date  . None    . Cardiac catheterization  2 yrs ago  . Polypectomy  10/16/2011    Procedure: POLYPECTOMY;  Surgeon: [name], MD;  Location: [facility];  Service: Endoscopy;;  Polypoid Lesion, Transverse and Sigmoid Colon  . Coronary stent placement  05/23/12   Family History  Problem Relation Age of Onset  . Emphysema Mother   . Heart attack Mother   . Hypertension Mother   . Diabetes Mother   . Heart disease Mother   . COPD Mother   . Emphysema Father   . COPD Father   . Stroke Sister   . Asthma Sister   . Colon cancer Neg Hx   . Anesthesia problems Neg Hx   . Hypotension Neg Hx   . Malignant hyperthermia Neg Hx   . Pseudochol deficiency Neg Hx    History  Substance Use Topics  . Smoking status: Current Some Day Smoker -- 1.00 packs/day for 30 years   Types: Cigars  . Smokeless tobacco: Not on file     Comment: since early 20s  . Alcohol Use: Yes     Comment: pint will last 3 days    Review of Systems  Constitutional: Positive for chills.  Respiratory: Positive for cough and shortness of breath.   Cardiovascular: Negative for chest pain.  Musculoskeletal: Positive for myalgias.  Neurological: Positive for headaches.  All other systems reviewed and are negative.    Allergies  Review of patient's allergies indicates no known allergies.  Home Medications   Current Outpatient Rx  Name  Route  Sig  Dispense  Refill  . albuterol (PROVENTIL HFA;VENTOLIN HFA) 108 (90 BASE) MCG/ACT inhaler      Two puffs before exercise , as needed, for  wheezing   18 g   3   . amLODipine (NORVASC) 10 MG tablet      take 1 tablet by mouth once daily   30 tablet   2   . atorvastatin (LIPITOR) 40 MG tablet   Oral   Take 1 tablet (40 mg total) by mouth daily at 6 PM.   30 tablet   0     Please call and make an appointment with our offic ...   . hydrochlorothiazide (HYDRODIURIL) 25 MG  tablet      take 1 tablet by mouth once daily   30 tablet   2   . irbesartan (AVAPRO) 300 MG tablet   Oral   Take 1 tablet (300 mg total) by mouth daily.   90 tablet   1     Patient needs to make an appt. for further refills ...   . aspirin (ASPIRIN ADULT LOW STRENGTH) 81 MG EC tablet   Oral   Take 81 mg by mouth daily.           . benzonatate (TESSALON PERLES) 100 MG capsule   Oral   Take 1 capsule (100 mg total) by mouth every 6 (six) hours as needed for cough.   30 capsule   0   . Coenzyme Q10 (CO Q 10 PO)   Oral   Take by mouth.         . insulin glargine (LANTUS SOLOSTAR) 100 UNIT/ML injection   Subcutaneous   Inject 30 Units into the skin at bedtime.         . metoprolol succinate (TOPROL-XL) 50 MG 24 hr tablet      TAKE 1 TABLET BY MOUTH TWICE A DAY. TAKE WITH OR IMMEDIATELY FOLLOWING A MEAL   60 tablet   3   .  EXPIRED: nitroGLYCERIN (NITROSTAT) 0.4 MG SL tablet   Sublingual   Place 1 tablet (0.4 mg total) under the tongue every 5 (five) minutes as needed for chest pain.   25 tablet   3   . prasugrel (EFFIENT) 10 MG TABS   Oral   Take 1 tablet (10 mg total) by mouth daily.   30 tablet   11   . Saxagliptin-Metformin 2.04-999 MG TB24   Oral   Take 2 tablets by mouth daily.   30 tablet   5   . spironolactone (ALDACTONE) 25 MG tablet      take 1 tablet by mouth once daily   30 tablet   3    BP 144/103  Pulse 110  Temp(Src) 99.7 F (37.6 C) (Oral)  Resp 22  Ht 5\' 10"  (1.778 m)  Wt 210 lb (95.255 kg)  BMI 30.13 kg/m2  SpO2 94% Physical Exam CONSTITUTIONAL: Well developed/well nourished HEAD: Normocephalic/atraumatic EYES: EOMI/PERRL ENMT: Mucous membranes moist, nasal congestio noted NECK: supple no meningeal signs SPINE:entire spine nontender CV: S1/S2 noted, no murmurs/rubs/gallops noted LUNGS: decreased BS noted bilaterally with scattered wheeze, but no distress and he can speak comfortably ABDOMEN: soft, nontender, no rebound or guarding GU:no cva tenderness NEURO: Pt is awake/alert, moves all extremitiesx4 EXTREMITIES: pulses normal, full ROM, no LE edema noted SKIN: warm, color normal PSYCH: no abnormalities of mood noted  ED Course  Procedures (including critical care time) Labs Review Labs Reviewed - No data to display Imaging Review No results found.  EKG Interpretation   None       MDM  No diagnosis found. Nursing notes including past medical history and social history reviewed and considered in documentation xrays reviewed and considered   5:38 AM Pt feels improved He still has some wheeze but is improved He is resting comfortable ,and he is able to ambulate without diffiiculty He reports h/o asthma, suspect he may have viral illness that has triggered this episode   Sharyon Cable, MD 01/14/14 [phone]

## 2014-01-16 ENCOUNTER — Ambulatory Visit (INDEPENDENT_AMBULATORY_CARE_PROVIDER_SITE_OTHER): Payer: 59 | Admitting: Family Medicine

## 2014-01-16 ENCOUNTER — Encounter: Payer: Self-pay | Admitting: Family Medicine

## 2014-01-16 VITALS — BP 104/80 | HR 99 | Temp 98.8°F | Resp 18 | Ht 70.0 in | Wt 212.0 lb

## 2014-01-16 DIAGNOSIS — E1165 Type 2 diabetes mellitus with hyperglycemia: Secondary | ICD-10-CM

## 2014-01-16 DIAGNOSIS — IMO0001 Reserved for inherently not codable concepts without codable children: Secondary | ICD-10-CM

## 2014-01-16 DIAGNOSIS — R059 Cough, unspecified: Secondary | ICD-10-CM

## 2014-01-16 DIAGNOSIS — J441 Chronic obstructive pulmonary disease with (acute) exacerbation: Secondary | ICD-10-CM | POA: Insufficient documentation

## 2014-01-16 DIAGNOSIS — IMO0002 Reserved for concepts with insufficient information to code with codable children: Secondary | ICD-10-CM

## 2014-01-16 DIAGNOSIS — Z794 Long term (current) use of insulin: Secondary | ICD-10-CM

## 2014-01-16 DIAGNOSIS — Z720 Tobacco use: Secondary | ICD-10-CM

## 2014-01-16 DIAGNOSIS — E785 Hyperlipidemia, unspecified: Secondary | ICD-10-CM

## 2014-01-16 DIAGNOSIS — F17200 Nicotine dependence, unspecified, uncomplicated: Secondary | ICD-10-CM

## 2014-01-16 DIAGNOSIS — R05 Cough: Secondary | ICD-10-CM

## 2014-01-16 DIAGNOSIS — I10 Essential (primary) hypertension: Secondary | ICD-10-CM

## 2014-01-16 DIAGNOSIS — J209 Acute bronchitis, unspecified: Secondary | ICD-10-CM

## 2014-01-16 DIAGNOSIS — E1065 Type 1 diabetes mellitus with hyperglycemia: Secondary | ICD-10-CM

## 2014-01-16 MED ORDER — ALBUTEROL SULFATE HFA 108 (90 BASE) MCG/ACT IN AERS: INHALATION_SPRAY | RESPIRATORY_TRACT | Status: DC

## 2014-01-16 MED ORDER — METHYLPREDNISOLONE ACETATE 80 MG/ML IJ SUSP
80.0000 mg | Freq: Once | INTRAMUSCULAR | Status: AC
  Administered 2014-01-16: 80 mg via INTRAMUSCULAR

## 2014-01-16 MED ORDER — PENICILLIN V POTASSIUM 500 MG PO TABS: 500.0000 mg | ORAL_TABLET | Freq: Three times a day (TID) | ORAL | Status: DC

## 2014-01-16 MED ORDER — CEFTRIAXONE SODIUM 1 G IJ SOLR
500.0000 mg | Freq: Once | INTRAMUSCULAR | Status: AC
  Administered 2014-01-16: 500 mg via INTRAMUSCULAR

## 2014-01-16 MED ORDER — TIOTROPIUM BROMIDE MONOHYDRATE 18 MCG IN CAPS: 18.0000 ug | ORAL_CAPSULE | Freq: Every day | RESPIRATORY_TRACT | Status: DC

## 2014-01-16 MED ORDER — IPRATROPIUM BROMIDE 0.02 % IN SOLN
0.5000 mg | Freq: Once | RESPIRATORY_TRACT | Status: AC
  Administered 2014-01-16: 0.5 mg via RESPIRATORY_TRACT

## 2014-01-16 MED ORDER — BUDESONIDE-FORMOTEROL FUMARATE 160-4.5 MCG/ACT IN AERO: 2.0000 | INHALATION_SPRAY | Freq: Two times a day (BID) | RESPIRATORY_TRACT | Status: DC

## 2014-01-16 MED ORDER — PREDNISONE 10 MG PO TABS: 10.0000 mg | ORAL_TABLET | Freq: Two times a day (BID) | ORAL | Status: DC

## 2014-01-16 MED ORDER — ALBUTEROL SULFATE (2.5 MG/3ML) 0.083% IN NEBU
2.5000 mg | INHALATION_SOLUTION | Freq: Once | RESPIRATORY_TRACT | Status: AC
  Administered 2014-01-16: 2.5 mg via RESPIRATORY_TRACT

## 2014-01-16 NOTE — Patient Instructions (Addendum)
F/u in 3 month, call if you need me before  You are having a flare up of COPD causing you to wheeze and have shortness of breath. Depo medrol and a breathing treatment are given in the office  You NEED to [place] are to take penicillin prescribed for 10 days for bacterial infection and you have also received Rocephin in the office   Use the symbicort inhaler twice daily every day, spiriva once daily and the albuterol evry 8 hours as needed for wheezing  Work excuse from Jan 31 to return 01/19/2014

## 2014-01-17 ENCOUNTER — Ambulatory Visit: Payer: 59 | Admitting: Family Medicine

## 2014-01-17 LAB — LIPID PANEL
Cholesterol: 127 mg/dL (ref 0–200)
HDL: 29 mg/dL — ABNORMAL LOW (ref >39)
LDL Cholesterol: 57 mg/dL (ref 0–99)
Total CHOL/HDL Ratio: 4.4 Ratio
Triglycerides: 207 mg/dL — ABNORMAL HIGH (ref <150)
VLDL: 41 mg/dL — ABNORMAL HIGH (ref 0–40)

## 2014-01-17 LAB — COMPLETE METABOLIC PANEL WITH GFR
ALT: 67 U/L — ABNORMAL HIGH (ref 0–53)
AST: 54 U/L — ABNORMAL HIGH (ref 0–37)
Albumin: 4.2 g/dL (ref 3.5–5.2)
Alkaline Phosphatase: 74 U/L (ref 39–117)
BUN: 17 mg/dL (ref 6–23)
CO2: 32 mEq/L (ref 19–32)
Calcium: 9.1 mg/dL (ref 8.4–10.5)
Chloride: 99 mEq/L (ref 96–112)
Creat: 1.07 mg/dL (ref 0.50–1.35)
GFR, Est African American: >89 mL/min
GFR, Est Non African American: 79 mL/min
Glucose, Bld: 186 mg/dL — ABNORMAL HIGH (ref 70–99)
Potassium: 3.8 mEq/L (ref 3.5–5.3)
Sodium: 140 mEq/L (ref 135–145)
Total Bilirubin: 1 mg/dL (ref 0.2–1.2)
Total Protein: 6.9 g/dL (ref 6.0–8.3)

## 2014-01-17 LAB — PSA: PSA: 0.38 ng/mL (ref <=4.00)

## 2014-01-18 ENCOUNTER — Telehealth: Payer: Self-pay | Admitting: *Deleted

## 2014-01-18 NOTE — Telephone Encounter (Signed)
[facility] [id number]  FAX [phone] ATORVASTATIN 40 MG  #30 1 TAB DAILY

## 2014-01-18 NOTE — Telephone Encounter (Signed)
Refill to [facility] per request

## 2014-01-30 NOTE — Assessment & Plan Note (Signed)
Antibiotics and decongestants prescribed medication also administered at office visit.    

## 2014-01-30 NOTE — Assessment & Plan Note (Signed)
Elevated TG Hyperlipidemia:Low fat diet discussed and encouraged.

## 2014-01-30 NOTE — Assessment & Plan Note (Signed)
Controlled, no change in medication  

## 2014-01-30 NOTE — Progress Notes (Signed)
   Subjective:    Patient ID: Antonio Cobb, male    DOB: 1961/07/17, 53 y.o.   MRN: [id number]  HPI  The PT is here for follow up and re-evaluation of chronic medical conditions, medication management and review of any available recent lab and radiology data.  Preventive health is updated, specifically  Cancer screening and Immunization.   Evaluated recently in Ed for chest congestion and cough, states not much improved, still short of breath with minimal activity, excesssive wheeze and cough. Sputum when produced is yellow and he has had chills and fever up to last night. Still smokes and unwilling to set a quit date. Not testing blood sugars as he should, reportsa marked fluctuations and generally over 200 when he tests      Review of Systems See HPI  Denies sinus pressure, nasal congestion, ear pain or sore throat.  Denies chest pains, palpitations and leg swelling Denies abdominal pain, nausea, vomiting,diarrhea or constipation.   Denies dysuria, frequency, hesitancy or incontinence. C/o generalized aches and pains associated with his illness as well as fatigue. Denies headaches, seizures, numbness, or tingling. Denies depression, anxiety or insomnia. Denies skin break down or rash.        Objective:   Physical Exam BP 104/80  Pulse 99  Temp(Src) 98.8 F (37.1 C)  Resp 18  Ht 5\' 10"  (1.778 m)  Wt 212 lb (96.163 kg)  BMI 30.42 kg/m2  SpO2 97%   Patient alert and oriented and in mild  cardiopulmonary distress.  HEENT: No facial asymmetry, EOMI, no sinus tenderness,  oropharynx pink and moist.  Neck supple no adenopathy.  Chest: decreased air entry, scattered crackles and wheezes bilaterally CVS: S1, S2 no murmurs, no S3.  ABD: Soft non tender. Bowel sounds normal.  Ext: No edema  MS: Adequate ROM spine, shoulders, hips and knees.  Skin: Intact, no ulcerations or rash noted.  Psych: Good eye contact, normal affect. Memory intact not anxious or  depressed appearing.  CNS: CN 2-12 intact, power, tone and sensation normal throughout.       Assessment & Plan:  COPD exacerbation Uncontrolled, steroid in office and orally, work excuse  Acute bronchitis Antibiotics and decongestants prescribed medication also administered at office visit.  [name]    HYPERTENSION Controlled, no change in medication   Diabetes mellitus, insulin dependent (IDDM), uncontrolled Deteriorated, needs to keep f/u with endo and follow the treatment plan Patient advised to reduce carb and sweets, commit to regular physical activity, take meds as prescribed, test blood as directed, and attempt to lose weight, to improve blood sugar control.   HYPERLIPIDEMIA Elevated TG Hyperlipidemia:Low fat diet discussed and encouraged.    Tobacco abuse unchnaged Patient counseled for approximately 5 minutes regarding the health risks of ongoing nicotine use, specifically all types of cancer, heart disease, stroke and respiratory failure. The options available for help with cessation ,the behavioral changes to assist the process, and the option to either gradully reduce usage  Or abruptly stop.is also discussed. Pt is also encouraged to set specific goals in number of cigarettes used daily, as well as to set a quit date.

## 2014-01-30 NOTE — Assessment & Plan Note (Signed)
unchnaged Patient counseled for approximately 5 minutes regarding the health risks of ongoing nicotine use, specifically all types of cancer, heart disease, stroke and respiratory failure. The options available for help with cessation ,the behavioral changes to assist the process, and the option to either gradully reduce usage  Or abruptly stop.is also discussed. Pt is also encouraged to set specific goals in number of cigarettes used daily, as well as to set a quit date.

## 2014-01-30 NOTE — Assessment & Plan Note (Signed)
Uncontrolled, steroid in office and orally, work excuse

## 2014-01-30 NOTE — Assessment & Plan Note (Signed)
Deteriorated, needs to keep f/u with endo and follow the treatment plan Patient advised to reduce carb and sweets, commit to regular physical activity, take meds as prescribed, test blood as directed, and attempt to lose weight, to improve blood sugar control.

## 2014-02-26 ENCOUNTER — Telehealth: Payer: Self-pay | Admitting: Adult Health

## 2014-02-26 NOTE — Telephone Encounter (Signed)
Refused. Has not been seen in cardiology since 2013. To be refilled by PCP.

## 2014-02-26 NOTE — Telephone Encounter (Signed)
Received fax refill request  Rx # [id number] Medication:  Metoprolol Succ ER 50 mg tab Qty 60 Sig:  Take one tablet by mouth twice a day with a meal Physician:  Purcell Nails

## 2014-03-01 ENCOUNTER — Telehealth: Payer: Self-pay

## 2014-03-01 NOTE — Telephone Encounter (Signed)
Pt overdue for cardiology fu apt.Kerin Ransom LM for pt to call back

## 2014-03-01 NOTE — Telephone Encounter (Signed)
Message copied by Bernita Raisin on Thu Mar 01, 2014 11:25 AM ------      Message from: Priscille Loveless      Created: Thu Mar 01, 2014 10:29 AM       Message left for patient to call to schedule f/u.            Coralyn Mark       ----- Message -----         From: Bernita Raisin, RN         Sent: 02/26/2014   9:29 AM           To: [place] for fu       ------

## 2014-03-05 ENCOUNTER — Telehealth: Payer: Self-pay | Admitting: Adult Health

## 2014-03-05 MED ORDER — ATORVASTATIN CALCIUM 40 MG PO TABS: 40.0000 mg | ORAL_TABLET | Freq: Every day | ORAL | Status: DC

## 2014-03-05 MED ORDER — METOPROLOL SUCCINATE ER 50 MG PO TB24: ORAL_TABLET | ORAL | Status: DC

## 2014-03-05 NOTE — Telephone Encounter (Signed)
Received fax refill request  Rx # [id number] Medication:  Atorvastatin 40 mg tablet Qty 30 Sig:  Take one tablet by month daily at 6 pt Physician:  Purcell Nails Received fax refill request  Rx # [id number] Medication:  Metoprolol Succ ER 50 mg tab Qty 30 Sig:  Take one tablet by mouth twice a day with a meal Physician:  Purcell Nails

## 2014-03-05 NOTE — Telephone Encounter (Signed)
Pt is overdue for follow and tried to contact pt with no answer. Gave pt only 15 pills this time. Told pt on bottle of last refill to call and make an appointment as well. Pt needs to make an appointment with [place] or get prescriptions refilled by primary care physician.

## 2014-03-15 ENCOUNTER — Other Ambulatory Visit: Payer: Self-pay

## 2014-03-15 MED ORDER — HYDROCHLOROTHIAZIDE 25 MG PO TABS: ORAL_TABLET | ORAL | Status: DC

## 2014-03-15 MED ORDER — IRBESARTAN 300 MG PO TABS: 300.0000 mg | ORAL_TABLET | Freq: Every day | ORAL | Status: DC

## 2014-04-11 ENCOUNTER — Ambulatory Visit: Payer: 59 | Admitting: Family Medicine

## 2014-04-24 ENCOUNTER — Ambulatory Visit: Payer: 59 | Admitting: Family Medicine

## 2014-05-12 ENCOUNTER — Other Ambulatory Visit: Payer: Self-pay | Admitting: Family Medicine

## 2014-05-14 ENCOUNTER — Telehealth: Payer: Self-pay | Admitting: Adult Health

## 2014-05-14 NOTE — Telephone Encounter (Signed)
refused

## 2014-05-16 ENCOUNTER — Ambulatory Visit: Payer: 59 | Admitting: Family Medicine

## 2014-06-05 ENCOUNTER — Encounter: Payer: Self-pay | Admitting: *Deleted

## 2014-06-28 ENCOUNTER — Telehealth: Payer: Self-pay | Admitting: Adult Health

## 2014-06-28 NOTE — Telephone Encounter (Signed)
Refused. Has been refused for the past couple of months.

## 2014-06-28 NOTE — Telephone Encounter (Signed)
Received fax refill request  Rx # [id number] Medication:  Metoprolol Succ ER 50 mg tab Qty 30 Sig:  Take one tablet by mouth twice a day with food Physician:  Purcell Nails Received fax refill request  Rx # [id number] Medication:  Atorvastatin 40 mg tablet Qty 15 Sig:  Take one tablet by mouth once daily at 6 pm  Physician:  Purcell Nails

## 2014-07-05 ENCOUNTER — Encounter: Payer: Self-pay | Admitting: Family Medicine

## 2014-07-05 ENCOUNTER — Encounter (INDEPENDENT_AMBULATORY_CARE_PROVIDER_SITE_OTHER): Payer: Self-pay

## 2014-07-05 ENCOUNTER — Ambulatory Visit (INDEPENDENT_AMBULATORY_CARE_PROVIDER_SITE_OTHER): Payer: BC Managed Care – PPO | Admitting: Family Medicine

## 2014-07-05 VITALS — BP 116/76 | HR 88 | Resp 18 | Ht 70.0 in | Wt 211.1 lb

## 2014-07-05 DIAGNOSIS — E1065 Type 1 diabetes mellitus with hyperglycemia: Secondary | ICD-10-CM

## 2014-07-05 DIAGNOSIS — E785 Hyperlipidemia, unspecified: Secondary | ICD-10-CM

## 2014-07-05 DIAGNOSIS — F17200 Nicotine dependence, unspecified, uncomplicated: Secondary | ICD-10-CM

## 2014-07-05 DIAGNOSIS — Z794 Long term (current) use of insulin: Secondary | ICD-10-CM

## 2014-07-05 DIAGNOSIS — Z23 Encounter for immunization: Secondary | ICD-10-CM

## 2014-07-05 DIAGNOSIS — E1165 Type 2 diabetes mellitus with hyperglycemia: Secondary | ICD-10-CM

## 2014-07-05 DIAGNOSIS — I2510 Atherosclerotic heart disease of native coronary artery without angina pectoris: Secondary | ICD-10-CM

## 2014-07-05 DIAGNOSIS — I10 Essential (primary) hypertension: Secondary | ICD-10-CM

## 2014-07-05 DIAGNOSIS — IMO0002 Reserved for concepts with insufficient information to code with codable children: Secondary | ICD-10-CM

## 2014-07-05 DIAGNOSIS — I7090 Unspecified atherosclerosis: Secondary | ICD-10-CM

## 2014-07-05 DIAGNOSIS — F1010 Alcohol abuse, uncomplicated: Secondary | ICD-10-CM

## 2014-07-05 DIAGNOSIS — IMO0001 Reserved for inherently not codable concepts without codable children: Secondary | ICD-10-CM

## 2014-07-05 DIAGNOSIS — I428 Other cardiomyopathies: Secondary | ICD-10-CM

## 2014-07-05 DIAGNOSIS — Z720 Tobacco use: Secondary | ICD-10-CM

## 2014-07-05 NOTE — Patient Instructions (Addendum)
F/u in 3.5 month, call if you need me before  TdAP today   cmp and EGFr today and HBA1C today copy to Dr Luretha Murphy need to get help for your addiction

## 2014-07-05 NOTE — Progress Notes (Signed)
Subjective:    Patient ID: Antonio Cobb, male    DOB: Jan 15, 1961, 53 y.o.   MRN: [id number]  HPI  The PT is here for follow up and re-evaluation of chronic medical conditions, medication management and review of any available recent lab and radiology data.  Preventive health is updated, specifically  Cancer screening and Immunization.   Pt states his alcohol use has increased , states dealing with the stress of death and having multiple alcoholics in his home at this time, still in denial about the significant health impact the alcohol has on his healthThe PT denies any adverse reactions to current medications since the last visit.  Still smokes on avg one cigar daily no plan to quit. States blood sugars are uncontrolled, he does not test regularly either, denies fatigue, polyuria or polydipsia , denies hypoglycemic episodes C/o left lateral chest wall pain with upper body movement for 1 week, felt as though he pulled a muscle before pain started. Denies radiation of pain or any associated nausea, light headedness, or diaphoresis    Review of Systems See HPI Denies recent fever or chills. Denies sinus pressure, nasal congestion, ear pain or sore throat. Denies chest congestion, productive cough or wheezing. Denies  palpitations, PND , orhtopnea and leg swelling Denies abdominal pain, nausea, vomiting,diarrhea or constipation.   Denies dysuria, frequency, hesitancy or incontinence. Denies joint pain, swelling and limitation in mobility. Denies headaches, seizures, numbness, or tingling. Denies depression, anxiety or insomnia. Denies skin break down or rash.        Objective:   Physical Exam BP 116/76  Pulse 88  Resp 18  Ht 5\' 10"  (1.778 m)  Wt 211 lb 1.3 oz (95.745 kg)  BMI 30.29 kg/m2  SpO2 98% Patient alert and oriented and in no cardiopulmonary distress.  HEENT: No facial asymmetry, EOMI,   oropharynx pink and moist.  Neck supple no JVD, no mass.  Chest: Clear  to auscultation bilaterally.Reproducible chest wall pain with upper body movement affecting  CVS: S1, S2 no murmurs, no S3.Regular rate.  ABD: Soft non tender.   Ext: No edema  MS: Adequate ROM spine, shoulders, hips and knees.  Skin: Intact, no ulcerations or rash noted.  Psych: Good eye contact, normal affect. Memory intact not anxious or depressed appearing.  CNS: CN 2-12 intact, power,  normal throughout.no focal deficits noted.        Assessment & Plan:  HYPERTENSION Controlled, no change in medication   NICM (nonischemic cardiomyopathy) I reviewed pt's echocardiogram today again , and advised that his ongoing excessive use of alcohol continues to damage his heart, stated he was unaware of this before, and undoubtedly based on current ongoing heavy alcohol use he knows that his heart is worse. Stated he would use this to help him to stop drinking. He also need cardiology f/u as not seen in over 12 months with double pathology and markedly elevated  risk  Arteriosclerotic cardiovascular disease (ASCVD) Pt denies exertional chest pain, he continues to smoke cigars and drink alcohol excessively. He also has uncontrolled diabetes. Cardiology f/u is past due , he is referred  Diabetes mellitus, insulin dependent (IDDM), uncontrolled Unchanged, pt remains non compliant , he is now treated  By endo. I encourage him to be more consistent in his diet and to help him to understand the importance of good blood sugar control on his health He will f/u with endo , he will call to reschedule his appt, lab will be sent from  today's visit  Alcohol abuse Worsening per pt reporting, and still resistant to getting help through AA or from recovered alcoholic friends that he does have . I again counseled and encouraged him to re address the issue  Tobacco abuse unchanged Patient counseled for approximately 5 minutes regarding the health risks of ongoing nicotine use, specifically all types  of cancer, heart disease, stroke and respiratory failure. The options available for help with cessation ,the behavioral changes to assist the process, and the option to either gradully reduce usage  Or abruptly stop.is also discussed. Pt is also encouraged to set specific goals in number of cigarettes used daily, as well as to set a quit date. No commitment to quitting currently  HYPERLIPIDEMIA Elevated TG, pt needs to lower intake of fried and fatty foods, cheese and butter, continue statin

## 2014-07-06 LAB — COMPLETE METABOLIC PANEL WITH GFR
ALBUMIN: 4.4 g/dL (ref 3.5–5.2)
ALT: 42 U/L (ref 0–53)
AST: 29 U/L (ref 0–37)
Alkaline Phosphatase: 81 U/L (ref 39–117)
BUN: 21 mg/dL (ref 6–23)
CALCIUM: 9.7 mg/dL (ref 8.4–10.5)
CHLORIDE: 103 meq/L (ref 96–112)
CO2: 28 meq/L (ref 19–32)
Creat: 1.12 mg/dL (ref 0.50–1.35)
GFR, EST AFRICAN AMERICAN: 86 mL/min
GFR, Est Non African American: 74 mL/min
Glucose, Bld: 110 mg/dL — ABNORMAL HIGH (ref 70–99)
POTASSIUM: 3.8 meq/L (ref 3.5–5.3)
Sodium: 140 mEq/L (ref 135–145)
Total Bilirubin: 0.8 mg/dL (ref 0.2–1.2)
Total Protein: 7.4 g/dL (ref 6.0–8.3)

## 2014-07-06 LAB — HEMOGLOBIN A1C
HEMOGLOBIN A1C: 9.9 % — AB (ref <5.7)
Mean Plasma Glucose: 237 mg/dL — ABNORMAL HIGH (ref <117)

## 2014-07-06 NOTE — Assessment & Plan Note (Signed)
Elevated TG, pt needs to lower intake of fried and fatty foods, cheese and butter, continue statin

## 2014-07-06 NOTE — Assessment & Plan Note (Signed)
unchanged Patient counseled for approximately 5 minutes regarding the health risks of ongoing nicotine use, specifically all types of cancer, heart disease, stroke and respiratory failure. The options available for help with cessation ,the behavioral changes to assist the process, and the option to either gradully reduce usage  Or abruptly stop.is also discussed. Pt is also encouraged to set specific goals in number of cigarettes used daily, as well as to set a quit date. No commitment to quitting currently

## 2014-07-06 NOTE — Assessment & Plan Note (Signed)
Worsening per pt reporting, and still resistant to getting help through AA or from recovered alcoholic friends that he does have . I again counseled and encouraged him to re address the issue

## 2014-07-06 NOTE — Assessment & Plan Note (Signed)
I reviewed pt's echocardiogram today again , and advised that his ongoing excessive use of alcohol continues to damage his heart, stated he was unaware of this before, and undoubtedly based on current ongoing heavy alcohol use he knows that his heart is worse. Stated he would use this to help him to stop drinking. He also need cardiology f/u as not seen in over 12 months with double pathology and markedly elevated  risk

## 2014-07-06 NOTE — Assessment & Plan Note (Signed)
Pt denies exertional chest pain, he continues to smoke cigars and drink alcohol excessively. He also has uncontrolled diabetes. Cardiology f/u is past due , he is referred

## 2014-07-06 NOTE — Assessment & Plan Note (Signed)
Controlled, no change in medication  

## 2014-07-06 NOTE — Assessment & Plan Note (Signed)
Unchanged, pt remains non compliant , he is now treated  By endo. I encourage him to be more consistent in his diet and to help him to understand the importance of good blood sugar control on his health He will f/u with endo , he will call to reschedule his appt, lab will be sent from today's visit

## 2014-07-16 ENCOUNTER — Other Ambulatory Visit: Payer: Self-pay

## 2014-07-16 DIAGNOSIS — J441 Chronic obstructive pulmonary disease with (acute) exacerbation: Secondary | ICD-10-CM

## 2014-07-16 DIAGNOSIS — R059 Cough, unspecified: Secondary | ICD-10-CM

## 2014-07-16 DIAGNOSIS — R05 Cough: Secondary | ICD-10-CM

## 2014-07-16 MED ORDER — SPIRONOLACTONE 25 MG PO TABS: ORAL_TABLET | ORAL | Status: DC

## 2014-07-16 MED ORDER — ALBUTEROL SULFATE HFA 108 (90 BASE) MCG/ACT IN AERS: INHALATION_SPRAY | RESPIRATORY_TRACT | Status: DC

## 2014-07-16 MED ORDER — IRBESARTAN 300 MG PO TABS: 300.0000 mg | ORAL_TABLET | Freq: Every day | ORAL | Status: DC

## 2014-07-16 MED ORDER — HYDROCHLOROTHIAZIDE 25 MG PO TABS: ORAL_TABLET | ORAL | Status: DC

## 2014-07-16 MED ORDER — METOPROLOL SUCCINATE ER 50 MG PO TB24: ORAL_TABLET | ORAL | Status: DC

## 2014-07-16 MED ORDER — ATORVASTATIN CALCIUM 40 MG PO TABS: 40.0000 mg | ORAL_TABLET | Freq: Every day | ORAL | Status: DC

## 2014-07-16 MED ORDER — AMLODIPINE BESYLATE 10 MG PO TABS: ORAL_TABLET | ORAL | Status: DC

## 2014-08-21 ENCOUNTER — Encounter: Payer: Self-pay | Admitting: Cardiology

## 2014-08-21 ENCOUNTER — Ambulatory Visit (INDEPENDENT_AMBULATORY_CARE_PROVIDER_SITE_OTHER): Payer: BC Managed Care – PPO | Admitting: Cardiology

## 2014-08-21 VITALS — BP 102/72 | HR 88 | Ht 70.0 in | Wt 210.0 lb

## 2014-08-21 DIAGNOSIS — I2510 Atherosclerotic heart disease of native coronary artery without angina pectoris: Secondary | ICD-10-CM

## 2014-08-21 DIAGNOSIS — I10 Essential (primary) hypertension: Secondary | ICD-10-CM

## 2014-08-21 DIAGNOSIS — E785 Hyperlipidemia, unspecified: Secondary | ICD-10-CM

## 2014-08-21 MED ORDER — ATORVASTATIN CALCIUM 80 MG PO TABS: 80.0000 mg | ORAL_TABLET | Freq: Every day | ORAL | Status: DC

## 2014-08-21 MED ORDER — METOPROLOL SUCCINATE ER 50 MG PO TB24: 50.0000 mg | ORAL_TABLET | Freq: Every day | ORAL | Status: DC

## 2014-08-21 NOTE — Patient Instructions (Signed)
Your physician wants you to follow-up in: 6 months with Dr. Bryna Colander will receive a reminder letter in the mail two months in advance. If you don't receive a letter, please call our office to schedule the follow-up appointment.  Your physician has recommended you make the following change in your medication:   DECREASE METOPROLOL TO 50 MG DAILY  INCREASE YOUR LIPITOR TO 36 MG DAILY  Your physician has requested that you regularly monitor and record your blood pressure readings at home. Please use the same machine at the same time of day to check your readings and record them to bring to your follow-up visit.  Thank you for choosing [facility] !

## 2014-08-21 NOTE — Progress Notes (Signed)
Clinical Summary Antonio Cobb is a 53 y.o.male las seen by NP Purcell Nails, this is our first visit together. He is seen for the following medical problems  1. CAD - hx of inferolateral STEMI June 2013, received DES to LCX. LVEF at that time 55%.  - he denies any chest pain other than some left chest wall pain with movement that has resolved. - denies any SOB, no DOE. Can walk up to 2 miles without issues. No LE edema, no orthopnea.  - compliant with meds. Can get some dizziness when standing at times  2. Hyperlipidemia - compliant with atrovastatin  3. HTN - does not check at home regularly - compliant with meds  4. EtOH abuse   Past Medical History  Diagnosis Date  . Alcohol use   . Obesity   . Nicotine addiction   . Hyperlipidemia   . Diabetes mellitus, type 2   . Hypertension   . Other and unspecified alcohol dependence, unspecified drinking behavior   . Elevated cholesterol   . Myocardial infarction 05/23/12     No Known Allergies   Current Outpatient Prescriptions  Medication Sig Dispense Refill  . albuterol (PROVENTIL HFA;VENTOLIN HFA) 108 (90 BASE) MCG/ACT inhaler Two puffs before exercise , as needed, for  wheezing  18 g  3  . amLODipine (NORVASC) 10 MG tablet take 1 tablet by mouth once daily  30 tablet  4  . aspirin (ASPIRIN ADULT LOW STRENGTH) 81 MG EC tablet Take 81 mg by mouth daily.        [name] atorvastatin (LIPITOR) 40 MG tablet Take 1 tablet (40 mg total) by mouth daily at 6 PM.  30 tablet  4  . budesonide-formoterol (SYMBICORT) 160-4.5 MCG/ACT inhaler Inhale 2 puffs into the lungs 2 (two) times daily.  1 Inhaler  12  . Coenzyme Q10 (CO Q 10 PO) Take by mouth.      . hydrochlorothiazide (HYDRODIURIL) 25 MG tablet take 1 tablet by mouth once daily  30 tablet  4  . insulin aspart (NOVOLOG) 100 UNIT/ML injection Inject 15 Units into the skin 3 (three) times daily before meals. Sliding scale begins at 15 units and increases as CBG dictates      . insulin  glargine (LANTUS SOLOSTAR) 100 UNIT/ML injection Inject 60 Units into the skin at bedtime.       . irbesartan (AVAPRO) 300 MG tablet Take 1 tablet (300 mg total) by mouth daily.  30 tablet  4  . metoprolol succinate (TOPROL-XL) 50 MG 24 hr tablet TAKE 1 TABLET BY MOUTH TWICE A DAY. TAKE WITH OR IMMEDIATELY FOLLOWING A MEAL  30 tablet  4  . nitroGLYCERIN (NITROSTAT) 0.4 MG SL tablet Place 1 tablet (0.4 mg total) under the tongue every 5 (five) minutes as needed for chest pain.  25 tablet  3  . prasugrel (EFFIENT) 10 MG TABS Take 1 tablet (10 mg total) by mouth daily.  30 tablet  11  . Saxagliptin-Metformin 2.04-999 MG TB24 Take 2 tablets by mouth daily.  30 tablet  5  . spironolactone (ALDACTONE) 25 MG tablet take 1 tablet by mouth once daily  30 tablet  4  . tiotropium (SPIRIVA) 18 MCG inhalation capsule Place 1 capsule (18 mcg total) into inhaler and inhale daily.  30 capsule  12   No current facility-administered medications for this visit.     Past Surgical History  Procedure Laterality Date  . None    . Cardiac catheterization  2  yrs ago  . Polypectomy  10/16/2011    Procedure: POLYPECTOMY;  Surgeon: [name], MD;  Location: [facility];  Service: Endoscopy;;  Polypoid Lesion, Transverse and Sigmoid Colon  . Coronary stent placement  05/23/12     No Known Allergies    Family History  Problem Relation Age of Onset  . Emphysema Mother   . Heart attack Mother   . Hypertension Mother   . Diabetes Mother   . Heart disease Mother   . COPD Mother   . Emphysema Father   . COPD Father   . Stroke Sister   . Asthma Sister   . Colon cancer Neg Hx   . Anesthesia problems Neg Hx   . Hypotension Neg Hx   . Malignant hyperthermia Neg Hx   . Pseudochol deficiency Neg Hx      Social History Antonio Cobb reports that he has been smoking Cigars.  He does not have any smokeless tobacco history on file. Antonio Cobb reports that he drinks alcohol.   Review of Systems CONSTITUTIONAL:  No weight loss, fever, chills, weakness or fatigue.  HEENT: Eyes: No visual loss, blurred vision, double vision or yellow sclerae.No hearing loss, sneezing, congestion, runny nose or sore throat.  SKIN: No rash or itching.  CARDIOVASCULAR: per HPI RESPIRATORY: No shortness of breath, cough or sputum.  GASTROINTESTINAL: No anorexia, nausea, vomiting or diarrhea. No abdominal pain or blood.  GENITOURINARY: No burning on urination, no polyuria NEUROLOGICAL: occas dizziness MUSCULOSKELETAL: No muscle, back pain, joint pain or stiffness.  LYMPHATICS: No enlarged nodes. No history of splenectomy.  PSYCHIATRIC: No history of depression or anxiety.  ENDOCRINOLOGIC: No reports of sweating, cold or heat intolerance. No polyuria or polydipsia.  [name]   Physical Examination p 88 bp 102/72 Wt 210 lbs BMI 30 Gen: resting comfortably, no acute distress HEENT: no scleral icterus, pupils equal round and reactive, no palptable cervical adenopathy,  CV: RRR, no m/r/g, no JVD, no carotid bruits Resp: Clear to auscultation bilaterally GI: abdomen is soft, non-tender, non-distended, normal bowel sounds, no hepatosplenomegaly MSK: extremities are warm, no edema.  Skin: warm, no rash Neuro:  no focal deficits Psych: appropriate affect   Diagnostic Studies Cath 05/2012 [facility] FINDINGS  Hemodynamics:  AO 119/78  LV 121/71  Coronary angiography:  Coronary dominance: right  Left mainstem: The left main is patent with diffuse nonobstructive plaque. The distal left main has 30% stenosis.  Left anterior descending (LAD): the LAD is patent to the LV apex. The mid LAD has 50-60% stenosis at the origin of the second diagonal branch. The first diagonal is very large in caliber and has no significant obstructive disease.  Left circumflex (LCx): the left circumflex is totally occluded in the mid vessel. There is TIMI 0 flow. Following PCI, there were 2 obtuse marginals and a left posterolateral branch visualized,  all of which are patent.  Right coronary artery (RCA): there is a high anterior origin of the RCA. The mid vessel has diffuse plaque estimated at 50-60%. The vessel is dominant. There is diffuse nonobstructive disease throughout. There is a small PDA and small posterolateral branch.  Left ventriculography: Left ventricular systolic function is in the low-normal range, LVEF is estimated at 55%, there is hypokinesis of the basal and midinferior wall.  PCI Note: Following the diagnostic procedure, the decision was made to proceed with PCI. The patient was loaded with Effient 60 mg. Weight-based bivalirudin was given for anticoagulation. Once a therapeutic ACT was achieved, a 6  [place] XB-LAD guide catheter was inserted. A Cougar coronary guidewire was used to cross the lesion. The lesion was predilated with a 2.5x15 mm balloon. The lesion was then stented with a 3.5x15 mm Resolute drug-eluting stent. Following PCI, there was 0% residual stenosis and TIMI-3 flow. Final angiography confirmed an excellent result. The patient tolerated the procedure well. There were no immediate procedural complications. A TR band was used for radial hemostasis. The patient was transferred to the post catheterization recovery area for further monitoring.  PCI Data:  Vessel - circumflex /Segment - mid  Percent Stenosis (pre) 100  TIMI-flow 0  Stent 3.5 x 15 mm resolute integrity DES  Percent Stenosis (post) 0  TIMI-flow (post) 3  Final Conclusions:  1. Total occlusion of the left circumflex with successful primary PCI using a drug-eluting stent platform  2. Diffuse nonobstructive disease of the right coronary artery and LAD  3. Mild left ventricular dysfunction consistent with inferior MI  Recommendations:  transfer to ICU. Post MI medical therapy will be instituted. Tobacco cessation counseling will be done. Anticipate 48 hour hospitalization if his post MI course is uncomplicated.    Assessment and Plan  1. CAD - no  current symptoms - continue risk factor modification and secondary prevention  2. Hyperlipidemia - change to high dose statin in setting of known CAD  3. HTN - at goal, continue current meds - will keep bp log and bring next visit - occasional orthostatic symptoms, counseled that could be related to combination of his meds and EtOH abuse. Counseled on decreased EtOH use, follow symptoms, may have to decrease dose of meds if symptoms progress  4. EtOH - addressed in detail at recent pcp visit   F/u 6 months   Arnoldo Lenis, M.D., F.A.C.C.

## 2014-10-01 ENCOUNTER — Telehealth: Payer: Self-pay

## 2014-10-01 ENCOUNTER — Other Ambulatory Visit: Payer: Self-pay | Admitting: Family Medicine

## 2014-10-01 MED ORDER — IPRATROPIUM-ALBUTEROL 0.5-2.5 (3) MG/3ML IN SOLN: 3.0000 mL | Freq: Four times a day (QID) | RESPIRATORY_TRACT | Status: DC | PRN

## 2014-10-01 NOTE — Telephone Encounter (Signed)
He wants solution for neb not inhaler

## 2014-10-01 NOTE — Telephone Encounter (Signed)
Patient ensures that he is not having chest pain.

## 2014-10-01 NOTE — Telephone Encounter (Signed)
States he has been using his inhaler but gets SOB at night x 1 week and already has a nebulizer and wants to know if he can have albuterol called in for it to [facility].

## 2014-10-01 NOTE — Telephone Encounter (Signed)
pls call Kasch Lieske rer heart message

## 2014-10-01 NOTE — Telephone Encounter (Signed)
pls send in albuterol MDI 2 puffs every 6 to 8 hrs as needed for wheezing and let him know  Pls let him kniow if he is having chest pain with increased exertional fatigue may be his heart and he will need to go to the ED

## 2014-11-13 ENCOUNTER — Encounter: Payer: Self-pay | Admitting: *Deleted

## 2014-11-13 ENCOUNTER — Ambulatory Visit: Payer: BC Managed Care – PPO | Admitting: Family Medicine

## 2014-11-22 ENCOUNTER — Encounter (HOSPITAL_COMMUNITY): Payer: Self-pay | Admitting: Cardiovascular Disease

## 2015-03-23 ENCOUNTER — Encounter (HOSPITAL_COMMUNITY): Payer: Self-pay | Admitting: Emergency Medicine

## 2015-03-23 ENCOUNTER — Emergency Department (HOSPITAL_COMMUNITY)
Admission: EM | Admit: 2015-03-23 | Discharge: 2015-03-23 | Disposition: A | Discharge status: Home / Self Care | Payer: BLUE CROSS/BLUE SHIELD | Attending: Emergency Medicine | Admitting: Emergency Medicine

## 2015-03-23 ENCOUNTER — Emergency Department (HOSPITAL_COMMUNITY): Payer: BLUE CROSS/BLUE SHIELD

## 2015-03-23 DIAGNOSIS — I252 Old myocardial infarction: Secondary | ICD-10-CM | POA: Insufficient documentation

## 2015-03-23 DIAGNOSIS — R0981 Nasal congestion: Secondary | ICD-10-CM | POA: Diagnosis not present

## 2015-03-23 DIAGNOSIS — Z7982 Long term (current) use of aspirin: Secondary | ICD-10-CM | POA: Insufficient documentation

## 2015-03-23 DIAGNOSIS — Z9889 Other specified postprocedural states: Secondary | ICD-10-CM | POA: Diagnosis not present

## 2015-03-23 DIAGNOSIS — Z720 Tobacco use: Secondary | ICD-10-CM | POA: Insufficient documentation

## 2015-03-23 DIAGNOSIS — B370 Candidal stomatitis: Secondary | ICD-10-CM | POA: Diagnosis not present

## 2015-03-23 DIAGNOSIS — E780 Pure hypercholesterolemia: Secondary | ICD-10-CM | POA: Diagnosis not present

## 2015-03-23 DIAGNOSIS — E669 Obesity, unspecified: Secondary | ICD-10-CM | POA: Insufficient documentation

## 2015-03-23 DIAGNOSIS — Z9861 Coronary angioplasty status: Secondary | ICD-10-CM | POA: Insufficient documentation

## 2015-03-23 DIAGNOSIS — R0602 Shortness of breath: Secondary | ICD-10-CM | POA: Insufficient documentation

## 2015-03-23 DIAGNOSIS — Z79899 Other long term (current) drug therapy: Secondary | ICD-10-CM | POA: Diagnosis not present

## 2015-03-23 DIAGNOSIS — Z794 Long term (current) use of insulin: Secondary | ICD-10-CM | POA: Diagnosis not present

## 2015-03-23 DIAGNOSIS — E785 Hyperlipidemia, unspecified: Secondary | ICD-10-CM | POA: Insufficient documentation

## 2015-03-23 DIAGNOSIS — R739 Hyperglycemia, unspecified: Secondary | ICD-10-CM

## 2015-03-23 DIAGNOSIS — E1165 Type 2 diabetes mellitus with hyperglycemia: Secondary | ICD-10-CM | POA: Insufficient documentation

## 2015-03-23 DIAGNOSIS — Z7951 Long term (current) use of inhaled steroids: Secondary | ICD-10-CM | POA: Diagnosis not present

## 2015-03-23 DIAGNOSIS — E119 Type 2 diabetes mellitus without complications: Secondary | ICD-10-CM | POA: Insufficient documentation

## 2015-03-23 DIAGNOSIS — I10 Essential (primary) hypertension: Secondary | ICD-10-CM | POA: Insufficient documentation

## 2015-03-23 DIAGNOSIS — R05 Cough: Secondary | ICD-10-CM | POA: Insufficient documentation

## 2015-03-23 DIAGNOSIS — K146 Glossodynia: Secondary | ICD-10-CM | POA: Diagnosis present

## 2015-03-23 LAB — CBC WITH DIFFERENTIAL/PLATELET
Basophils Absolute: 0.1 10*3/uL (ref 0.0–0.1)
Basophils Relative: 1 % (ref 0–1)
EOS ABS: 0.4 10*3/uL (ref 0.0–0.7)
Eosinophils Relative: 5 % (ref 0–5)
HEMATOCRIT: 44.2 % (ref 39.0–52.0)
HEMOGLOBIN: 15.3 g/dL (ref 13.0–17.0)
Lymphocytes Relative: 30 % (ref 12–46)
Lymphs Abs: 2.5 10*3/uL (ref 0.7–4.0)
MCH: 29.7 pg (ref 26.0–34.0)
MCHC: 34.6 g/dL (ref 30.0–36.0)
MCV: 85.7 fL (ref 78.0–100.0)
Monocytes Absolute: 0.6 10*3/uL (ref 0.1–1.0)
Monocytes Relative: 8 % (ref 3–12)
NEUTROS ABS: 4.8 10*3/uL (ref 1.7–7.7)
Neutrophils Relative %: 56 % (ref 43–77)
Platelets: 212 10*3/uL (ref 150–400)
RBC: 5.16 MIL/uL (ref 4.22–5.81)
RDW: 12.2 % (ref 11.5–15.5)
WBC: 8.4 10*3/uL (ref 4.0–10.5)

## 2015-03-23 LAB — BASIC METABOLIC PANEL
Anion gap: 10 (ref 5–15)
BUN: 18 mg/dL (ref 6–23)
CO2: 25 mmol/L (ref 19–32)
Calcium: 9.7 mg/dL (ref 8.4–10.5)
Chloride: 95 mmol/L — ABNORMAL LOW (ref 96–112)
Creatinine, Ser: 1.19 mg/dL (ref 0.50–1.35)
GFR calc Af Amer: 78 mL/min — ABNORMAL LOW (ref >90)
GFR calc non Af Amer: 68 mL/min — ABNORMAL LOW (ref >90)
Glucose, Bld: 575 mg/dL (ref 70–99)
Potassium: 4.4 mmol/L (ref 3.5–5.1)
Sodium: 130 mmol/L — ABNORMAL LOW (ref 135–145)

## 2015-03-23 LAB — CBG MONITORING, ED
GLUCOSE-CAPILLARY: 283 mg/dL — AB (ref 70–99)
Glucose-Capillary: >600 mg/dL (ref 70–99)
Glucose-Capillary: 427 mg/dL — ABNORMAL HIGH (ref 70–99)
Glucose-Capillary: 328 mg/dL — ABNORMAL HIGH (ref 70–99)

## 2015-03-23 LAB — URINE MICROSCOPIC-ADD ON

## 2015-03-23 LAB — URINALYSIS, ROUTINE W REFLEX MICROSCOPIC
Bilirubin Urine: NEGATIVE
Glucose, UA: >1000 mg/dL — AB
Hgb urine dipstick: NEGATIVE
KETONES UR: NEGATIVE mg/dL
LEUKOCYTES UA: NEGATIVE
NITRITE: NEGATIVE
PH: 5 (ref 5.0–8.0)
Protein, ur: NEGATIVE mg/dL
Urobilinogen, UA: 0.2 mg/dL (ref 0.0–1.0)

## 2015-03-23 LAB — BETA-HYDROXYBUTYRIC ACID: Beta-Hydroxybutyric Acid: 0.19 mmol/L (ref 0.05–0.27)

## 2015-03-23 LAB — I-STAT CG4 LACTIC ACID, ED: Lactic Acid, Venous: 2.07 mmol/L (ref 0.5–2.0)

## 2015-03-23 MED ORDER — SODIUM CHLORIDE 0.9 % IV SOLN: 1000.0000 mL | Freq: Once | INTRAVENOUS | Status: DC

## 2015-03-23 MED ORDER — SODIUM CHLORIDE 0.9 % IV BOLUS (SEPSIS)
1000.0000 mL | Freq: Once | INTRAVENOUS | Status: AC
  Administered 2015-03-23: 1000 mL via INTRAVENOUS

## 2015-03-23 MED ORDER — INSULIN ASPART 100 UNIT/ML ~~LOC~~ SOLN
SUBCUTANEOUS | Status: AC
  Filled 2015-03-23: qty 1

## 2015-03-23 MED ORDER — NYSTATIN 100000 UNIT/ML MT SUSP: 5.0000 mL | Freq: Four times a day (QID) | OROMUCOSAL | Status: DC

## 2015-03-23 MED ORDER — SODIUM CHLORIDE 0.9 % IV SOLN
1000.0000 mL | Freq: Once | INTRAVENOUS | Status: AC
  Administered 2015-03-23: 1000 mL via INTRAVENOUS

## 2015-03-23 MED ORDER — INSULIN ASPART 100 UNIT/ML IV SOLN
10.0000 [IU] | Freq: Once | INTRAVENOUS | Status: AC
  Administered 2015-03-23: 10 [IU] via INTRAVENOUS

## 2015-03-23 MED ORDER — INSULIN GLARGINE 100 UNIT/ML ~~LOC~~ SOLN: 60.0000 [IU] | Freq: Every day | SUBCUTANEOUS | Status: DC

## 2015-03-23 MED ORDER — INSULIN ASPART 100 UNIT/ML ~~LOC~~ SOLN: 15.0000 [IU] | Freq: Three times a day (TID) | SUBCUTANEOUS | Status: DC

## 2015-03-23 MED ORDER — INSULIN ASPART 100 UNIT/ML ~~LOC~~ SOLN
15.0000 [IU] | Freq: Once | SUBCUTANEOUS | Status: AC
  Administered 2015-03-23: 15 [IU] via SUBCUTANEOUS
  Filled 2015-03-23: qty 1

## 2015-03-23 MED ORDER — INSULIN GLARGINE 100 UNIT/ML ~~LOC~~ SOLN
60.0000 [IU] | Freq: Once | SUBCUTANEOUS | Status: AC
  Administered 2015-03-23: 60 [IU] via SUBCUTANEOUS
  Filled 2015-03-23: qty 0.6

## 2015-03-23 NOTE — ED Notes (Addendum)
Pt reports tongue pain for last several days. Pt denies any fever,n/v. nad noted.minimal white coating noted to pt tongue. Pt denies any recent abx use.

## 2015-03-23 NOTE — ED Provider Notes (Signed)
CSN: [id number]     Arrival date & time 03/23/15  1631 History   First MD Initiated Contact with Patient 03/23/15 1645     Chief Complaint  Patient presents with  . Mouth Injury     (Consider location/radiation/quality/duration/timing/severity/associated sxs/prior Treatment) The history is provided by the patient.   Antonio Cobb is a 54 y.o. male who presents to the ED with tongue pain that started several days ago. He reports that his tongue has been coated white. He denies being on any antibiotics recently. He reports that his mouth feels dry and he needs to put water on his lips and drink water often. He feels like his mouth and tongue are swollen. He has been out of his insulin for several days and is only taking PO medications for his diabetes. He complains of a congested cough that he has had for over 2 weeks.   Past Medical History  Diagnosis Date  . Alcohol use   . Obesity   . Nicotine addiction   . Hyperlipidemia   . Diabetes mellitus, type 2   . Hypertension   . Other and unspecified alcohol dependence, unspecified drinking behavior   . Elevated cholesterol   . Myocardial infarction 05/23/12   Past Surgical History  Procedure Laterality Date  . None    . Cardiac catheterization  2 yrs ago  . Polypectomy  10/16/2011    Procedure: POLYPECTOMY;  Surgeon: [name], MD;  Location: [facility];  Service: Endoscopy;;  Polypoid Lesion, Transverse and Sigmoid Colon  . Coronary stent placement  05/23/12  . Left heart catheterization with coronary angiogram N/A 05/23/2012    Procedure: LEFT HEART CATHETERIZATION WITH CORONARY ANGIOGRAM;  Surgeon: [name], MD;  Location: [facility] CATH LAB;  Service: Cardiovascular;  Laterality: N/A;  . Percutaneous coronary stent intervention (pci-s) N/A 05/23/2012    Procedure: PERCUTANEOUS CORONARY STENT INTERVENTION (PCI-S);  Surgeon: [name], MD;  Location: [facility] CATH LAB;  Service: Cardiovascular;  Laterality: N/A;   Family History   Problem Relation Age of Onset  . Emphysema Mother   . Heart attack Mother   . Hypertension Mother   . Diabetes Mother   . Heart disease Mother   . COPD Mother   . Emphysema Father   . COPD Father   . Stroke Sister   . Asthma Sister   . Colon cancer Neg Hx   . Anesthesia problems Neg Hx   . Hypotension Neg Hx   . Malignant hyperthermia Neg Hx   . Pseudochol deficiency Neg Hx    History  Substance Use Topics  . Smoking status: Light Tobacco Smoker -- 1.00 packs/day for 30 years    Types: Cigars  . Smokeless tobacco: Never Used     Comment: since early 75s  . Alcohol Use: Yes     Comment: pint will last 3 days    Review of Systems  HENT: Positive for congestion and facial swelling.        Sore swollen white coated tongue  Respiratory: Positive for cough and shortness of breath.   all other systems negative    Allergies  Review of patient's allergies indicates no known allergies.  Home Medications   Prior to Admission medications   Medication Sig Start Date End Date Taking? Authorizing Provider  albuterol (PROVENTIL HFA;VENTOLIN HFA) 108 (90 BASE) MCG/ACT inhaler Two puffs before exercise , as needed, for  wheezing 07/16/14   [name], MD  amLODipine (NORVASC) 10 MG tablet take  1 tablet by mouth once daily 07/16/14   [name], MD  aspirin (ASPIRIN ADULT LOW STRENGTH) 81 MG EC tablet Take 81 mg by mouth daily.      Historical Provider, MD  atorvastatin (LIPITOR) 80 MG tablet Take 1 tablet (80 mg total) by mouth daily. 08/21/14   Arnoldo Lenis, MD  budesonide-formoterol [facility]) 160-4.5 MCG/ACT inhaler Inhale 2 puffs into the lungs 2 (two) times daily. 01/16/14   [name], MD  hydrochlorothiazide (HYDRODIURIL) 25 MG tablet take 1 tablet by mouth once daily 07/16/14   [name], MD  insulin aspart (NOVOLOG) 100 UNIT/ML injection Inject 15 Units into the skin 3 (three) times daily before meals. Sliding scale begins at 15 units and  increases as CBG dictates    Historical Provider, MD  insulin glargine (LANTUS SOLOSTAR) 100 UNIT/ML injection Inject 60 Units into the skin at bedtime.     Historical Provider, MD  ipratropium-albuterol (DUONEB) 0.5-2.5 (3) MG/3ML SOLN Take 3 mLs by nebulization every 6 (six) hours as needed. 10/01/14   [name], MD  irbesartan (AVAPRO) 300 MG tablet Take 1 tablet (300 mg total) by mouth daily. 07/16/14   [name], MD  metoprolol succinate (TOPROL-XL) 50 MG 24 hr tablet Take 1 tablet (50 mg total) by mouth daily. Take with or immediately following a meal. 08/21/14   Arnoldo Lenis, MD  nitroGLYCERIN (NITROSTAT) 0.4 MG SL tablet Place 1 tablet (0.4 mg total) under the tongue every 5 (five) minutes as needed for chest pain. 12/05/12 07/05/14  Lendon Colonel, NP  spironolactone (ALDACTONE) 25 MG tablet take 1 tablet by mouth once daily 07/16/14   [name], MD  tiotropium [facility]) 18 MCG inhalation capsule Place 1 capsule (18 mcg total) into inhaler and inhale daily. 01/16/14   [name], MD   BP 148/109 mmHg  Pulse 112  Temp(Src) 98.4 F (36.9 C) (Oral)  Resp 14  Ht 5\' 10"  (1.778 m)  Wt 194 lb 12.8 oz (88.361 kg)  BMI 27.95 kg/m2  SpO2 98% Physical Exam  Constitutional: He is oriented to person, place, and time. He appears well-developed and well-nourished.  HENT:  Head: Normocephalic.  Mouth/Throat: Uvula is midline.  Tongue with white coating  Eyes: EOM are normal.  Neck: Neck supple.  Cardiovascular: Normal rate.   Pulmonary/Chest: Effort normal.  Musculoskeletal: Normal range of motion.  Neurological: He is alert and oriented to person, place, and time. No cranial nerve deficit.  Skin: Skin is warm and dry.  Psychiatric: He has a normal mood and affect.  Nursing note and vitals reviewed.   ED Course  Procedures  Medical screening exam complete.  Patient with glucose >600 would not register on CBG machine. Patient moved to exam room 18 and  Dr. Betsey Holiday will assume care of the patient.  Results for orders placed or performed during the hospital encounter of 03/23/15 (from the past 24 hour(s))  POC CBG, ED     Status: Abnormal   Collection Time: 03/23/15  4:53 PM  Result Value Ref Range   Glucose-Capillary >600 (HH) 70 - 99 mg/dL   Comment 1 Call MD NNP PA [place], NP 03/23/15 [place], MD 03/23/15 [place], MD 03/23/15 2019

## 2015-03-23 NOTE — ED Notes (Signed)
Patient with no complaints at this time. Respirations even and unlabored. Skin warm/dry. Discharge instructions reviewed with patient at this time. Patient given opportunity to voice concerns/ask questions. IV removed per policy and band-aid applied to site. Patient discharged at this time and left Emergency Department with steady gait.  

## 2015-03-23 NOTE — ED Notes (Signed)
CRITICAL VALUE ALERT  Critical value received:  Blood Glucose 575  Date of notification:  03/23/2015  Time of notification:  1802  Critical value read back:Yes.    Nurse who received alert:  Domenica Reamer RN   MD notified (1st page):  Dr Betsey Holiday  Time of first page:  1802  MD notified (2nd page):  Time of second page:  Responding MD:  Dr Betsey Holiday  Time MD responded: [phone]

## 2015-03-26 ENCOUNTER — Telehealth: Payer: Self-pay

## 2015-03-26 DIAGNOSIS — IMO0001 Reserved for inherently not codable concepts without codable children: Secondary | ICD-10-CM

## 2015-03-26 DIAGNOSIS — E785 Hyperlipidemia, unspecified: Secondary | ICD-10-CM

## 2015-03-26 DIAGNOSIS — Z794 Long term (current) use of insulin: Principal | ICD-10-CM

## 2015-03-26 DIAGNOSIS — E1165 Type 2 diabetes mellitus with hyperglycemia: Principal | ICD-10-CM

## 2015-03-26 NOTE — Telephone Encounter (Signed)
Lab add

## 2015-03-27 ENCOUNTER — Telehealth: Payer: Self-pay

## 2015-03-27 DIAGNOSIS — E785 Hyperlipidemia, unspecified: Secondary | ICD-10-CM

## 2015-03-27 DIAGNOSIS — E1165 Type 2 diabetes mellitus with hyperglycemia: Secondary | ICD-10-CM

## 2015-03-27 DIAGNOSIS — IMO0001 Reserved for inherently not codable concepts without codable children: Secondary | ICD-10-CM

## 2015-03-27 DIAGNOSIS — Z794 Long term (current) use of insulin: Secondary | ICD-10-CM

## 2015-03-27 NOTE — Telephone Encounter (Signed)
Error

## 2015-04-03 ENCOUNTER — Other Ambulatory Visit: Payer: Self-pay | Admitting: Family Medicine

## 2015-04-03 ENCOUNTER — Telehealth: Payer: Self-pay | Admitting: Family Medicine

## 2015-04-03 ENCOUNTER — Other Ambulatory Visit: Payer: Self-pay

## 2015-04-03 MED ORDER — METOPROLOL SUCCINATE ER 50 MG PO TB24: 50.0000 mg | ORAL_TABLET | Freq: Every day | ORAL | Status: DC

## 2015-04-03 MED ORDER — SPIRONOLACTONE 25 MG PO TABS: ORAL_TABLET | ORAL | Status: DC

## 2015-04-03 MED ORDER — AMLODIPINE BESYLATE 10 MG PO TABS: ORAL_TABLET | ORAL | Status: DC

## 2015-04-03 MED ORDER — IRBESARTAN 300 MG PO TABS: 300.0000 mg | ORAL_TABLET | Freq: Every day | ORAL | Status: DC

## 2015-04-03 MED ORDER — HYDROCHLOROTHIAZIDE 25 MG PO TABS: ORAL_TABLET | ORAL | Status: DC

## 2015-04-03 NOTE — Telephone Encounter (Signed)
meds refilled 

## 2015-04-04 LAB — LIPID PANEL
CHOLESTEROL: 160 mg/dL (ref 0–200)
HDL: 47 mg/dL (ref >=40)
LDL CALC: 86 mg/dL (ref 0–99)
Total CHOL/HDL Ratio: 3.4 Ratio
Triglycerides: 137 mg/dL (ref <150)
VLDL: 27 mg/dL (ref 0–40)

## 2015-04-04 LAB — HEMOGLOBIN A1C
Hgb A1c MFr Bld: 14 % — ABNORMAL HIGH (ref <5.7)
Mean Plasma Glucose: 355 mg/dL — ABNORMAL HIGH (ref <117)

## 2015-04-15 ENCOUNTER — Encounter: Payer: Self-pay | Admitting: Family Medicine

## 2015-04-15 ENCOUNTER — Ambulatory Visit: Payer: BLUE CROSS/BLUE SHIELD | Admitting: Family Medicine

## 2015-04-29 ENCOUNTER — Other Ambulatory Visit: Payer: Self-pay | Admitting: Family Medicine

## 2015-04-29 ENCOUNTER — Other Ambulatory Visit: Payer: Self-pay

## 2015-04-29 DIAGNOSIS — R05 Cough: Secondary | ICD-10-CM

## 2015-04-29 DIAGNOSIS — R059 Cough, unspecified: Secondary | ICD-10-CM

## 2015-04-29 DIAGNOSIS — J441 Chronic obstructive pulmonary disease with (acute) exacerbation: Secondary | ICD-10-CM

## 2015-04-29 MED ORDER — TIOTROPIUM BROMIDE MONOHYDRATE 18 MCG IN CAPS: 18.0000 ug | ORAL_CAPSULE | Freq: Every day | RESPIRATORY_TRACT | Status: DC

## 2015-04-29 MED ORDER — ALBUTEROL SULFATE HFA 108 (90 BASE) MCG/ACT IN AERS: INHALATION_SPRAY | RESPIRATORY_TRACT | Status: DC

## 2015-05-06 ENCOUNTER — Ambulatory Visit (INDEPENDENT_AMBULATORY_CARE_PROVIDER_SITE_OTHER): Payer: BLUE CROSS/BLUE SHIELD | Admitting: Family Medicine

## 2015-05-06 ENCOUNTER — Encounter: Payer: Self-pay | Admitting: Family Medicine

## 2015-05-06 VITALS — BP 120/82 | HR 86 | Resp 18 | Ht 70.0 in | Wt 205.1 lb

## 2015-05-06 DIAGNOSIS — Z720 Tobacco use: Secondary | ICD-10-CM

## 2015-05-06 DIAGNOSIS — F1010 Alcohol abuse, uncomplicated: Secondary | ICD-10-CM

## 2015-05-06 DIAGNOSIS — J302 Other seasonal allergic rhinitis: Secondary | ICD-10-CM

## 2015-05-06 DIAGNOSIS — E785 Hyperlipidemia, unspecified: Secondary | ICD-10-CM

## 2015-05-06 DIAGNOSIS — J42 Unspecified chronic bronchitis: Secondary | ICD-10-CM | POA: Diagnosis not present

## 2015-05-06 DIAGNOSIS — J309 Allergic rhinitis, unspecified: Secondary | ICD-10-CM | POA: Insufficient documentation

## 2015-05-06 DIAGNOSIS — IMO0001 Reserved for inherently not codable concepts without codable children: Secondary | ICD-10-CM

## 2015-05-06 DIAGNOSIS — I2510 Atherosclerotic heart disease of native coronary artery without angina pectoris: Secondary | ICD-10-CM

## 2015-05-06 DIAGNOSIS — R062 Wheezing: Secondary | ICD-10-CM | POA: Diagnosis not present

## 2015-05-06 DIAGNOSIS — E663 Overweight: Secondary | ICD-10-CM

## 2015-05-06 DIAGNOSIS — Z794 Long term (current) use of insulin: Secondary | ICD-10-CM

## 2015-05-06 DIAGNOSIS — E1165 Type 2 diabetes mellitus with hyperglycemia: Secondary | ICD-10-CM

## 2015-05-06 DIAGNOSIS — I10 Essential (primary) hypertension: Secondary | ICD-10-CM

## 2015-05-06 DIAGNOSIS — E1065 Type 1 diabetes mellitus with hyperglycemia: Secondary | ICD-10-CM | POA: Diagnosis not present

## 2015-05-06 MED ORDER — PREDNISONE 5 MG (21) PO TBPK: 5.0000 mg | ORAL_TABLET | Freq: Every day | ORAL | Status: DC

## 2015-05-06 MED ORDER — MONTELUKAST SODIUM 10 MG PO TABS: 10.0000 mg | ORAL_TABLET | Freq: Every day | ORAL | Status: DC

## 2015-05-06 MED ORDER — FLUTICASONE PROPIONATE 50 MCG/ACT NA SUSP: 2.0000 | Freq: Every day | NASAL | Status: DC

## 2015-05-06 MED ORDER — BUDESONIDE-FORMOTEROL FUMARATE 80-4.5 MCG/ACT IN AERO: 2.0000 | INHALATION_SPRAY | Freq: Two times a day (BID) | RESPIRATORY_TRACT | Status: DC

## 2015-05-06 MED ORDER — AZELASTINE HCL 0.1 % NA SOLN: 2.0000 | Freq: Two times a day (BID) | NASAL | Status: DC

## 2015-05-06 MED ORDER — IPRATROPIUM BROMIDE 0.02 % IN SOLN
0.5000 mg | Freq: Once | RESPIRATORY_TRACT | Status: AC
  Administered 2015-05-06: 0.5 mg via RESPIRATORY_TRACT

## 2015-05-06 MED ORDER — PROMETHAZINE-DM 6.25-15 MG/5ML PO SYRP: 5.0000 mL | ORAL_SOLUTION | Freq: Four times a day (QID) | ORAL | Status: DC | PRN

## 2015-05-06 MED ORDER — METHYLPREDNISOLONE ACETATE 80 MG/ML IJ SUSP
80.0000 mg | Freq: Once | INTRAMUSCULAR | Status: AC
  Administered 2015-05-06: 80 mg via INTRAMUSCULAR

## 2015-05-06 MED ORDER — ALBUTEROL SULFATE (2.5 MG/3ML) 0.083% IN NEBU
2.5000 mg | INHALATION_SOLUTION | Freq: Once | RESPIRATORY_TRACT | Status: AC
  Administered 2015-05-06: 2.5 mg via RESPIRATORY_TRACT

## 2015-05-06 MED ORDER — MOMETASONE FUROATE 50 MCG/ACT NA SUSP: 2.0000 | Freq: Every day | NASAL | Status: DC

## 2015-05-06 NOTE — Patient Instructions (Addendum)
F/u in 4 .5 month, call if you need me before  Neb treatnmnt in office and depomedrol for wheezing , nasal congestion and allergic skin reaction on left flank  Prednisone dose pack prescribed for 6 days  New medications for allergies and wheezing, steroid nasal spray,,astellin nose spray, phenergan DM, prednisone dose pack,singulair, and symbicort  You are referred to Podiatrist, and also to Dr Luan Pulling for furhter evaluation of breathing  Blood pressure is excellent, PLEASE work on blood sugar, cigars andn alcohol  Please work on good  health habits so that your health will improve. 1. Commitment to daily physical activity for 30 to 60  minutes, if you are able to do this.  2. Commitment to wise food choices. Aim for half of your  food intake to be vegetable and fruit, one quarter starchy foods, and one quarter protein. Try to eat on a regular schedule  3 meals per day, snacking between meals should be limited to vegetables or fruits or small portions of nuts. 64 ounces of water per day is generally recommended, unless you have specific health conditions, like heart failure or kidney failure where you will need to limit fluid intake.  3. Commitment to sufficient and a  good quality of physical and mental rest daily, generally between 6 to 8 hours per day.  WITH PERSISTANCE AND PERSEVERANCE, THE IMPOSSIBLE , BECOMES THE NORM! Thanks for choosing [facility], we consider it a privelige to serve you.

## 2015-05-07 ENCOUNTER — Encounter: Payer: Self-pay | Admitting: Family Medicine

## 2015-05-08 LAB — MICROALBUMIN / CREATININE URINE RATIO
CREATININE, URINE: 124.5 mg/dL
Microalb Creat Ratio: 6.4 mg/g (ref 0.0–30.0)
Microalb, Ur: 0.8 mg/dL (ref <2.0)

## 2015-06-09 NOTE — Assessment & Plan Note (Signed)
Deteriorated. Patient re-educated about  the importance of commitment to a  minimum of 150 minutes of exercise per week.  The importance of healthy food choices with portion control discussed. Encouraged to start a food diary, count calories and to consider  joining a support group. Sample diet sheets offered. Goals set by the patient for the next several months.   Weight /BMI 05/06/2015 03/23/2015 08/21/2014  WEIGHT 205 lb 1.9 oz 194 lb 12.8 oz 210 lb  HEIGHT 5\' 10"  5\' 10"  5\' 10"   BMI 29.43 kg/m2 27.95 kg/m2 30.13 kg/m2    Current exercise per week 90 min

## 2015-06-09 NOTE — Assessment & Plan Note (Signed)
Hyperlipidemia:Low fat diet discussed and encouraged.   Lipid Panel  Lab Results  Component Value Date   CHOL 160 04/03/2015   HDL 47 04/03/2015   LDLCALC 86 04/03/2015   LDLDIRECT TEST NOT PERFORMED 06/19/2011   TRIG 137 04/03/2015   CHOLHDL 3.4 04/03/2015   Controlled, no change in medication

## 2015-06-09 NOTE — Assessment & Plan Note (Signed)
Antonio Cobb is reminded of the importance of commitment to daily physical activity for 30 minutes or more, as able and the need to limit carbohydrate intake to 30 to 60 grams per meal to help with blood sugar control.   The need to take medication as prescribed, test blood sugar as directed, and to call between visits if there is a concern that blood sugar is uncontrolled is also discussed.   Antonio Cobb is reminded of the importance of daily foot exam, annual eye examination, and good blood sugar, blood pressure and cholesterol control. Markedly deteriorated, needs to return to endo for management of his diabetes, remains non compliant  Diabetic Labs Latest Ref Rng 05/06/2015 04/03/2015 03/23/2015 07/05/2014 01/16/2014  HbA1c <5.7 % - 14.0(H) - 9.9(H) -  Microalbumin <2.0 mg/dL 0.8 - - - -  Micro/Creat Ratio 0.0 - 30.0 mg/g 6.4 - - - -  Chol 0 - 200 mg/dL - 160 - - 127  HDL >=40 mg/dL - 47 - - 29(L)  Calc LDL 0 - 99 mg/dL - 86 - - 57  Triglycerides <150 mg/dL - 137 - - 207(H)  Creatinine 0.50 - 1.35 mg/dL - - 1.19 1.12 1.07   BP/Weight 05/06/2015 03/23/2015 08/21/2014 07/05/2014 01/16/2014 01/14/2014 26/33/3545  Systolic BP 625 638 937 342 876 811 572  Diastolic BP 82 620 72 76 80 103 94  Wt. (Lbs) 205.12 194.8 210 211.08 212 210 204.12  BMI 29.43 27.95 30.13 30.29 30.42 30.13 29.29   Foot/eye exam completion dates 05/06/2015 09/29/2013  Foot Form Completion Done Done

## 2015-06-09 NOTE — Assessment & Plan Note (Signed)
Controlled, no change in medication DASH diet and commitment to daily physical activity for a minimum of 30 minutes discussed and encouraged, as a part of hypertension management. The importance of attaining a healthy weight is also discussed.  BP/Weight 05/06/2015 03/23/2015 08/21/2014 07/05/2014 01/16/2014 01/14/2014 12/06/8249  Systolic BP 037 048 889 169 450 388 828  Diastolic BP 82 003 72 76 80 103 94  Wt. (Lbs) 205.12 194.8 210 211.08 212 210 204.12  BMI 29.43 27.95 30.13 30.29 30.42 30.13 29.29

## 2015-06-09 NOTE — Assessment & Plan Note (Signed)
Uncontrolled likley has COPD due to long h/o nicotine which persists

## 2015-06-09 NOTE — Assessment & Plan Note (Signed)
Currently stable and asymptomatic, no med change and ongoing cardiology care

## 2015-06-09 NOTE — Assessment & Plan Note (Signed)
Uncontrolled and recurrent, depo medorl and neb treatment and short course of prednisone, refer to pulmonary also

## 2015-06-09 NOTE — Assessment & Plan Note (Signed)
Ongoing daily use and dpendence, pt remains in denial, encouraged once again t quit and join AA

## 2015-06-09 NOTE — Assessment & Plan Note (Signed)
Uncontrolled , start daily meds and aggressive short course of steroids also daily saline flushes of nares

## 2015-06-09 NOTE — Assessment & Plan Note (Signed)
Unchanged Patient counseled for approximately 5 minutes regarding the health risks of ongoing nicotine use, specifically all types of cancer, heart disease, stroke and respiratory failure. The options available for help with cessation ,the behavioral changes to assist the process, and the option to either gradully reduce usage  Or abruptly stop.is also discussed. Pt is also encouraged to set specific goals in number of cigarettes used daily, as well as to set a quit date.  Number of cigarettes/cigars currently smoking daily: 12 to 15

## 2015-06-09 NOTE — Progress Notes (Signed)
Antonio BEBER     MRN: [id number]      DOB: [birth year]   HPI Antonio Cobb is here for follow up and re-evaluation of chronic medical conditions, medication management and review of any available recent lab and radiology data.  Preventive health is updated, specifically  Cancer screening and Immunization.   Questions or concerns regarding consultations or procedures which the PT has had in the interim are  addressed. The PT denies any adverse reactions to current medications since the last visit.  10 day h/o increased and uncontrolled allergies symptoms with nasal and chest congestion with wheeze and increased shortness of breath Requests referral for foot care Denies polyuria, polydipsia, blurred vision , or hypoglycemic episodes. Not testing regualrly  ROS Denies recent fever or chills.  Denies chest pains, palpitations and leg swelling Denies abdominal pain, nausea, vomiting,diarrhea or constipation.   Denies dysuria, frequency, hesitancy or incontinence. Denies joint pain, swelling and limitation in mobility. Denies headaches, seizures, numbness, or tingling. Denies depression, anxiety or insomnia. Denies skin break down or rash.   PE  BP 120/82 mmHg  Pulse 86  Resp 18  Ht 5\' 10"  (1.778 m)  Wt 205 lb 1.9 oz (93.042 kg)  BMI 29.43 kg/m2  SpO2 97%  Patient alert and oriented and in no cardiopulmonary distress.  HEENT: No facial asymmetry, EOMI,   oropharynx pink and moist.  Neck supple no JVD, no mass.marked edema of nasal mucosa, excessive watery eyres with clear drainage, sinuses non tender, Tm mildly erythematous  Chest: decreased air entry bilateral wheezes , no crackles CVS: S1, S2 no murmurs, no S3.Regular rate.  ABD: Soft non tender.   Ext: No edema  MS: Adequate ROM spine, shoulders, hips and knees.  Skin: Intact, no ulcerations or rash noted.  Psych: Good eye contact, normal affect. Memory intact not anxious or depressed appearing.  CNS: CN 2-12  intact, power,  normal throughout.no focal deficits noted.   Assessment & Plan   Wheezing Uncontrolled and recurrent, depo medorl and neb treatment and short course of prednisone, refer to pulmonary also  Tobacco abuse Unchanged Patient counseled for approximately 5 minutes regarding the health risks of ongoing nicotine use, specifically all types of cancer, heart disease, stroke and respiratory failure. The options available for help with cessation ,the behavioral changes to assist the process, and the option to either gradully reduce usage  Or abruptly stop.is also discussed. Pt is also encouraged to set specific goals in number of cigarettes used daily, as well as to set a quit date.  Number of cigarettes/cigars currently smoking daily: 12 to 15   Overweight Deteriorated. Patient re-educated about  the importance of commitment to a  minimum of 150 minutes of exercise per week.  The importance of healthy food choices with portion control discussed. Encouraged to start a food diary, count calories and to consider  joining a support group. Sample diet sheets offered. Goals set by the patient for the next several months.   Weight /BMI 05/06/2015 03/23/2015 08/21/2014  WEIGHT 205 lb 1.9 oz 194 lb 12.8 oz 210 lb  HEIGHT 5\' 10"  5\' 10"  5\' 10"   BMI 29.43 kg/m2 27.95 kg/m2 30.13 kg/m2    Current exercise per week 90 min   Hyperlipemia Hyperlipidemia:Low fat diet discussed and encouraged.   Lipid Panel  Lab Results  Component Value Date   CHOL 160 04/03/2015   HDL 47 04/03/2015   LDLCALC 86 04/03/2015   LDLDIRECT TEST NOT PERFORMED 06/19/2011   TRIG  137 04/03/2015   CHOLHDL 3.4 04/03/2015   Controlled, no change in medication      Diabetes mellitus, insulin dependent (IDDM), uncontrolled Antonio Cobb is reminded of the importance of commitment to daily physical activity for 30 minutes or more, as able and the need to limit carbohydrate intake to 30 to 60 grams per meal to  help with blood sugar control.   The need to take medication as prescribed, test blood sugar as directed, and to call between visits if there is a concern that blood sugar is uncontrolled is also discussed.   Antonio Cobb is reminded of the importance of daily foot exam, annual eye examination, and good blood sugar, blood pressure and cholesterol control. Markedly deteriorated, needs to return to endo for management of his diabetes, remains non compliant  Diabetic Labs Latest Ref Rng 05/06/2015 04/03/2015 03/23/2015 07/05/2014 01/16/2014  HbA1c <5.7 % - 14.0(H) - 9.9(H) -  Microalbumin <2.0 mg/dL 0.8 - - - -  Micro/Creat Ratio 0.0 - 30.0 mg/g 6.4 - - - -  Chol 0 - 200 mg/dL - 160 - - 127  HDL >=40 mg/dL - 47 - - 29(L)  Calc LDL 0 - 99 mg/dL - 86 - - 57  Triglycerides <150 mg/dL - 137 - - 207(H)  Creatinine 0.50 - 1.35 mg/dL - - 1.19 1.12 1.07   BP/Weight 05/06/2015 03/23/2015 08/21/2014 07/05/2014 01/16/2014 01/14/2014 75/64/3329  Systolic BP 518 841 660 630 160 109 323  Diastolic BP 82 557 72 76 80 103 94  Wt. (Lbs) 205.12 194.8 210 211.08 212 210 204.12  BMI 29.43 27.95 30.13 30.29 30.42 30.13 29.29   Foot/eye exam completion dates 05/06/2015 09/29/2013  Foot Form Completion Done Done         Essential hypertension Controlled, no change in medication DASH diet and commitment to daily physical activity for a minimum of 30 minutes discussed and encouraged, as a part of hypertension management. The importance of attaining a healthy weight is also discussed.  BP/Weight 05/06/2015 03/23/2015 08/21/2014 07/05/2014 01/16/2014 01/14/2014 32/20/2542  Systolic BP 706 237 628 315 176 160 737  Diastolic BP 82 106 72 76 80 103 94  Wt. (Lbs) 205.12 194.8 210 211.08 212 210 204.12  BMI 29.43 27.95 30.13 30.29 30.42 30.13 29.29        Arteriosclerotic cardiovascular disease (ASCVD) Currently stable and asymptomatic, no med change and ongoing cardiology care  Chronic bronchitis Uncontrolled likley has COPD  due to long h/o nicotine which persists  Seasonal allergies Uncontrolled , start daily meds and aggressive short course of steroids also daily saline flushes of nares  Alcohol abuse Ongoing daily use and dpendence, pt remains in denial, encouraged once again t quit and join AA

## 2015-07-29 ENCOUNTER — Telehealth: Payer: Self-pay

## 2015-07-29 MED ORDER — PROMETHAZINE-DM 6.25-15 MG/5ML PO SYRP: 5.0000 mL | ORAL_SOLUTION | Freq: Every evening | ORAL | Status: DC | PRN

## 2015-07-29 NOTE — Telephone Encounter (Signed)
C/o dry cough x 1 week, no mucus production. Phenergan DM sent to pharmacy per Dr verbal order

## 2015-08-09 ENCOUNTER — Other Ambulatory Visit: Payer: Self-pay

## 2015-08-09 DIAGNOSIS — R05 Cough: Secondary | ICD-10-CM

## 2015-08-09 DIAGNOSIS — R059 Cough, unspecified: Secondary | ICD-10-CM

## 2015-08-09 MED ORDER — ALBUTEROL SULFATE HFA 108 (90 BASE) MCG/ACT IN AERS: INHALATION_SPRAY | RESPIRATORY_TRACT | Status: DC

## 2015-10-09 ENCOUNTER — Encounter: Payer: Self-pay | Admitting: Family Medicine

## 2015-10-09 ENCOUNTER — Ambulatory Visit (INDEPENDENT_AMBULATORY_CARE_PROVIDER_SITE_OTHER): Payer: BLUE CROSS/BLUE SHIELD | Admitting: Family Medicine

## 2015-10-09 VITALS — BP 120/80 | HR 84 | Resp 16 | Ht 70.0 in | Wt 209.0 lb

## 2015-10-09 DIAGNOSIS — E108 Type 1 diabetes mellitus with unspecified complications: Secondary | ICD-10-CM

## 2015-10-09 DIAGNOSIS — Z720 Tobacco use: Secondary | ICD-10-CM

## 2015-10-09 DIAGNOSIS — J4541 Moderate persistent asthma with (acute) exacerbation: Secondary | ICD-10-CM | POA: Diagnosis not present

## 2015-10-09 DIAGNOSIS — I2510 Atherosclerotic heart disease of native coronary artery without angina pectoris: Secondary | ICD-10-CM

## 2015-10-09 DIAGNOSIS — E663 Overweight: Secondary | ICD-10-CM

## 2015-10-09 DIAGNOSIS — I10 Essential (primary) hypertension: Secondary | ICD-10-CM

## 2015-10-09 DIAGNOSIS — Z125 Encounter for screening for malignant neoplasm of prostate: Secondary | ICD-10-CM

## 2015-10-09 DIAGNOSIS — E785 Hyperlipidemia, unspecified: Secondary | ICD-10-CM

## 2015-10-09 DIAGNOSIS — Z1159 Encounter for screening for other viral diseases: Secondary | ICD-10-CM | POA: Diagnosis not present

## 2015-10-09 DIAGNOSIS — E1065 Type 1 diabetes mellitus with hyperglycemia: Secondary | ICD-10-CM

## 2015-10-09 DIAGNOSIS — J45901 Unspecified asthma with (acute) exacerbation: Secondary | ICD-10-CM | POA: Insufficient documentation

## 2015-10-09 DIAGNOSIS — IMO0002 Reserved for concepts with insufficient information to code with codable children: Secondary | ICD-10-CM

## 2015-10-09 DIAGNOSIS — F1010 Alcohol abuse, uncomplicated: Secondary | ICD-10-CM

## 2015-10-09 MED ORDER — ALBUTEROL SULFATE (2.5 MG/3ML) 0.083% IN NEBU
2.5000 mg | INHALATION_SOLUTION | Freq: Once | RESPIRATORY_TRACT | Status: AC
  Administered 2015-10-09: 2.5 mg via RESPIRATORY_TRACT

## 2015-10-09 MED ORDER — METHYLPREDNISOLONE ACETATE 80 MG/ML IJ SUSP
80.0000 mg | Freq: Once | INTRAMUSCULAR | Status: AC
  Administered 2015-10-09: 80 mg via INTRAMUSCULAR

## 2015-10-09 MED ORDER — IPRATROPIUM BROMIDE 0.02 % IN SOLN
0.5000 mg | Freq: Once | RESPIRATORY_TRACT | Status: AC
  Administered 2015-10-09: 0.5 mg via RESPIRATORY_TRACT

## 2015-10-09 MED ORDER — BUDESONIDE-FORMOTEROL FUMARATE 80-4.5 MCG/ACT IN AERO: 2.0000 | INHALATION_SPRAY | Freq: Two times a day (BID) | RESPIRATORY_TRACT | Status: DC

## 2015-10-09 MED ORDER — PREDNISONE 5 MG (21) PO TBPK: 5.0000 mg | ORAL_TABLET | ORAL | Status: DC

## 2015-10-09 NOTE — Progress Notes (Signed)
   Subjective:    Patient ID: Antonio Cobb, male    DOB: July 26, 1961, 54 y.o.   MRN: [id number]  HPI    Review of Systems     Objective:   Physical Exam        Assessment & Plan:

## 2015-10-09 NOTE — Patient Instructions (Signed)
Return for flu vaccine in 2 weeks  MD follow up in 4 months. Labs today.  You are treated for asthma flare, injection and breathing treatment in office, and resume daily symbicort, and short course of prednisone is prescribed  Use breathing machine at home twice daily  For next 5 days, after this you may be able to stop   PLEASE STOP SMOKING!  BP today is good

## 2015-10-10 LAB — HEPATITIS C ANTIBODY: HCV Ab: NEGATIVE

## 2015-10-10 LAB — HM DIABETES EYE EXAM

## 2015-10-10 LAB — COMPLETE METABOLIC PANEL WITH GFR
ALBUMIN: 4 g/dL (ref 3.6–5.1)
ALK PHOS: 84 U/L (ref 40–115)
ALT: 36 U/L (ref 9–46)
AST: 28 U/L (ref 10–35)
BILIRUBIN TOTAL: 0.8 mg/dL (ref 0.2–1.2)
BUN: 19 mg/dL (ref 7–25)
CO2: 27 mmol/L (ref 20–31)
Calcium: 9.3 mg/dL (ref 8.6–10.3)
Chloride: 105 mmol/L (ref 98–110)
Creat: 1.05 mg/dL (ref 0.70–1.33)
GFR, Est African American: >89 mL/min (ref >=60)
GFR, Est Non African American: 80 mL/min (ref >=60)
GLUCOSE: 100 mg/dL — AB (ref 65–99)
Potassium: 3.6 mmol/L (ref 3.5–5.3)
SODIUM: 143 mmol/L (ref 135–146)
TOTAL PROTEIN: 6.9 g/dL (ref 6.1–8.1)

## 2015-10-10 LAB — HEMOGLOBIN A1C
Hgb A1c MFr Bld: 11.2 % — ABNORMAL HIGH (ref <5.7)
Mean Plasma Glucose: 275 mg/dL — ABNORMAL HIGH (ref <117)

## 2015-10-10 LAB — PSA: PSA: 0.71 ng/mL (ref <=4.00)

## 2015-10-10 LAB — HIV ANTIBODY (ROUTINE TESTING W REFLEX): HIV 1&2 Ab, 4th Generation: NONREACTIVE

## 2015-10-14 NOTE — Progress Notes (Signed)
Antonio Cobb     MRN: [id number]      DOB: May 12, 1961   HPI Antonio Cobb is here for follow up and re-evaluation of chronic medical conditions, medication management and review of any available recent lab and radiology data.  Preventive health is updated, specifically  Cancer screening and Immunization.   3 week h/o progressively worsening dry cough with wheeze and exertional dyspnea , denies fever , chills , facial pain or green nasal d/c  ROS . Denies chest pains, palpitations, PND, orthopnea  and leg swelling Denies abdominal pain, nausea, vomiting,diarrhea or constipation.   Denies dysuria, frequency, hesitancy or incontinence. Denies joint pain, swelling and limitation in mobility. Denies headaches, seizures, numbness, or tingling. Denies depression, anxiety or insomnia. Denies skin break down or rash.   PE  BP 120/80 mmHg  Pulse 84  Resp 16  Ht 5\' 10"  (1.778 m)  Wt 209 lb (94.802 kg)  BMI 29.99 kg/m2  SpO2 95%  Patient alert and oriented and in moderate cardiopulmonary distress.  HEENT: No facial asymmetry, EOMI,   oropharynx pink and moist.  Neck supple no JVD, no mass.  Chest: decreased air entry and bilateral wheezing , no crackles CVS: S1, S2 no murmurs, no S3.Regular rate.  ABD: Soft non tender.   Ext: No edema  MS: Adequate ROM spine, shoulders, hips and knees.  Skin: Intact, no ulcerations or rash noted.  Psych: Good eye contact, normal affect. Memory intact not anxious or depressed appearing.  CNS: CN 2-12 intact, power,  normal throughout.no focal deficits noted.   Assessment & Plan   Acute asthma flare Neb treatment in office and IM depo medrol followed by prednisone dose pack. Re educated re need for daily spirometry to help with asthma evaluation from home so less chance of acute decompensation, also need to use daily prophylactic medication  Tobacco abuse Patient counseled for approximately 5 minutes regarding the health risks of  ongoing nicotine use, specifically all types of cancer, heart disease, stroke and respiratory failure. The options available for help with cessation ,the behavioral changes to assist the process, and the option to either gradully reduce usage  Or abruptly stop.is also discussed. Pt is also encouraged to set specific goals in number of cigarettes used daily, as well as to set a quit date.     Essential hypertension Controlled, no change in medication DASH diet and commitment to daily physical activity for a minimum of 30 minutes discussed and encouraged, as a part of hypertension management. The importance of attaining a healthy weight is also discussed.  BP/Weight 10/09/2015 05/06/2015 03/23/2015 08/21/2014 07/05/2014 07/15/9936 12/19/9676  Systolic BP 938 101 751 025 852 778 242  Diastolic BP 80 82 353 72 76 80 103  Wt. (Lbs) 209 205.12 194.8 210 211.08 212 210  BMI 29.99 29.43 27.95 30.13 30.29 30.42 30.13        Diabetes mellitus, insulin dependent (IDDM), uncontrolled Improved though still uncontrolled, needs to see endo Antonio Cobb is reminded of the importance of commitment to daily physical activity for 30 minutes or more, as able and the need to limit carbohydrate intake to 30 to 60 grams per meal to help with blood sugar control.   The need to take medication as prescribed, test blood sugar as directed, and to call between visits if there is a concern that blood sugar is uncontrolled is also discussed.   Antonio Cobb is reminded of the importance of daily foot exam, annual eye examination, and good  blood sugar, blood pressure and cholesterol control.  Diabetic Labs Latest Ref Rng 10/09/2015 05/06/2015 04/03/2015 03/23/2015 07/05/2014  HbA1c <5.7 % 11.2(H) - 14.0(H) - 9.9(H)  Microalbumin <2.0 mg/dL - 0.8 - - -  Micro/Creat Ratio 0.0 - 30.0 mg/g - 6.4 - - -  Chol 0 - 200 mg/dL - - 160 - -  HDL >=40 mg/dL - - 47 - -  Calc LDL 0 - 99 mg/dL - - 86 - -  Triglycerides <150 mg/dL - - 137 -  -  Creatinine 0.70 - 1.33 mg/dL 1.05 - - 1.19 1.12   BP/Weight 10/09/2015 05/06/2015 03/23/2015 08/21/2014 07/05/2014 08/20/2951 07/18/1323  Systolic BP 401 027 253 664 403 474 259  Diastolic BP 80 82 563 72 76 80 103  Wt. (Lbs) 209 205.12 194.8 210 211.08 212 210  BMI 29.99 29.43 27.95 30.13 30.29 30.42 30.13   Foot/eye exam completion dates Latest Ref Rng 10/10/2015 05/06/2015  Eye Exam No Retinopathy No Retinopathy -  Foot Form Completion - - Done         Hyperlipemia Hyperlipidemia:Low fat diet discussed and encouraged.   Lipid Panel  Lab Results  Component Value Date   CHOL 160 04/03/2015   HDL 47 04/03/2015   LDLCALC 86 04/03/2015   LDLDIRECT TEST NOT PERFORMED 06/19/2011   TRIG 137 04/03/2015   CHOLHDL 3.4 04/03/2015   Updated lab needed at/ before next visit. UnControlled, LDL above goal currently      Arteriosclerotic cardiovascular disease (ASCVD) Denies current chest pain or symptoms suggestive of cardiac decompensation. Importance of med adherence and cessation of nicotine and alcohol use are srtressed  Alcohol abuse Need to abstain discussed  Overweight Deteriorated. Patient re-educated about  the importance of commitment to a  minimum of 150 minutes of exercise per week.  The importance of healthy food choices with portion control discussed. Encouraged to start a food diary, count calories and to consider  joining a support group. Sample diet sheets offered. Goals set by the patient for the next several months.   Weight /BMI 10/09/2015 05/06/2015 03/23/2015  WEIGHT 209 lb 205 lb 1.9 oz 194 lb 12.8 oz  HEIGHT 5\' 10"  5\' 10"  5\' 10"   BMI 29.99 kg/m2 29.43 kg/m2 27.95 kg/m2

## 2015-10-14 NOTE — Assessment & Plan Note (Signed)
Improved though still uncontrolled, needs to see endo Antonio Cobb is reminded of the importance of commitment to daily physical activity for 30 minutes or more, as able and the need to limit carbohydrate intake to 30 to 60 grams per meal to help with blood sugar control.   The need to take medication as prescribed, test blood sugar as directed, and to call between visits if there is a concern that blood sugar is uncontrolled is also discussed.   Antonio Cobb is reminded of the importance of daily foot exam, annual eye examination, and good blood sugar, blood pressure and cholesterol control.  Diabetic Labs Latest Ref Rng 10/09/2015 05/06/2015 04/03/2015 03/23/2015 07/05/2014  HbA1c <5.7 % 11.2(H) - 14.0(H) - 9.9(H)  Microalbumin <2.0 mg/dL - 0.8 - - -  Micro/Creat Ratio 0.0 - 30.0 mg/g - 6.4 - - -  Chol 0 - 200 mg/dL - - 160 - -  HDL >=40 mg/dL - - 47 - -  Calc LDL 0 - 99 mg/dL - - 86 - -  Triglycerides <150 mg/dL - - 137 - -  Creatinine 0.70 - 1.33 mg/dL 1.05 - - 1.19 1.12   BP/Weight 10/09/2015 05/06/2015 03/23/2015 08/21/2014 07/05/2014 05/23/7947 0/12/6551  Systolic BP 748 270 786 754 492 010 071  Diastolic BP 80 82 219 72 76 80 103  Wt. (Lbs) 209 205.12 194.8 210 211.08 212 210  BMI 29.99 29.43 27.95 30.13 30.29 30.42 30.13   Foot/eye exam completion dates Latest Ref Rng 10/10/2015 05/06/2015  Eye Exam No Retinopathy No Retinopathy -  Foot Form Completion - - Done

## 2015-10-14 NOTE — Assessment & Plan Note (Signed)
Need to abstain discussed

## 2015-10-14 NOTE — Assessment & Plan Note (Signed)
Deteriorated. Patient re-educated about  the importance of commitment to a  minimum of 150 minutes of exercise per week.  The importance of healthy food choices with portion control discussed. Encouraged to start a food diary, count calories and to consider  joining a support group. Sample diet sheets offered. Goals set by the patient for the next several months.   Weight /BMI 10/09/2015 05/06/2015 03/23/2015  WEIGHT 209 lb 205 lb 1.9 oz 194 lb 12.8 oz  HEIGHT 5\' 10"  5\' 10"  5\' 10"   BMI 29.99 kg/m2 29.43 kg/m2 27.95 kg/m2

## 2015-10-14 NOTE — Assessment & Plan Note (Signed)
Denies current chest pain or symptoms suggestive of cardiac decompensation. Importance of med adherence and cessation of nicotine and alcohol use are srtressed

## 2015-10-14 NOTE — Assessment & Plan Note (Signed)
Controlled, no change in medication DASH diet and commitment to daily physical activity for a minimum of 30 minutes discussed and encouraged, as a part of hypertension management. The importance of attaining a healthy weight is also discussed.  BP/Weight 10/09/2015 05/06/2015 03/23/2015 08/21/2014 07/05/2014 03/19/9135 07/18/9922  Systolic BP 414 436 016 580 063 494 944  Diastolic BP 80 82 739 72 76 80 103  Wt. (Lbs) 209 205.12 194.8 210 211.08 212 210  BMI 29.99 29.43 27.95 30.13 30.29 30.42 30.13

## 2015-10-14 NOTE — Assessment & Plan Note (Signed)
Neb treatment in office and IM depo medrol followed by prednisone dose pack. Re educated re need for daily spirometry to help with asthma evaluation from home so less chance of acute decompensation, also need to use daily prophylactic medication

## 2015-10-14 NOTE — Assessment & Plan Note (Addendum)
Hyperlipidemia:Low fat diet discussed and encouraged.   Lipid Panel  Lab Results  Component Value Date   CHOL 160 04/03/2015   HDL 47 04/03/2015   LDLCALC 86 04/03/2015   LDLDIRECT TEST NOT PERFORMED 06/19/2011   TRIG 137 04/03/2015   CHOLHDL 3.4 04/03/2015   Updated lab needed at/ before next visit. UnControlled, LDL above goal currently

## 2015-10-14 NOTE — Assessment & Plan Note (Addendum)

## 2016-01-12 ENCOUNTER — Other Ambulatory Visit: Payer: Self-pay | Admitting: Family Medicine

## 2016-02-10 ENCOUNTER — Ambulatory Visit: Payer: BLUE CROSS/BLUE SHIELD | Admitting: Family Medicine

## 2016-02-10 ENCOUNTER — Encounter: Payer: Self-pay | Admitting: Family Medicine

## 2016-03-12 ENCOUNTER — Encounter: Payer: Self-pay | Admitting: Family Medicine

## 2016-03-12 ENCOUNTER — Ambulatory Visit (INDEPENDENT_AMBULATORY_CARE_PROVIDER_SITE_OTHER): Payer: BLUE CROSS/BLUE SHIELD | Admitting: Family Medicine

## 2016-03-12 VITALS — BP 118/84 | HR 90 | Resp 18 | Ht 70.0 in | Wt 203.0 lb

## 2016-03-12 DIAGNOSIS — I429 Cardiomyopathy, unspecified: Secondary | ICD-10-CM

## 2016-03-12 DIAGNOSIS — IMO0002 Reserved for concepts with insufficient information to code with codable children: Secondary | ICD-10-CM

## 2016-03-12 DIAGNOSIS — I10 Essential (primary) hypertension: Secondary | ICD-10-CM

## 2016-03-12 DIAGNOSIS — E108 Type 1 diabetes mellitus with unspecified complications: Secondary | ICD-10-CM

## 2016-03-12 DIAGNOSIS — I2510 Atherosclerotic heart disease of native coronary artery without angina pectoris: Secondary | ICD-10-CM

## 2016-03-12 DIAGNOSIS — Z23 Encounter for immunization: Secondary | ICD-10-CM

## 2016-03-12 DIAGNOSIS — F1010 Alcohol abuse, uncomplicated: Secondary | ICD-10-CM

## 2016-03-12 DIAGNOSIS — I428 Other cardiomyopathies: Secondary | ICD-10-CM

## 2016-03-12 DIAGNOSIS — E1065 Type 1 diabetes mellitus with hyperglycemia: Secondary | ICD-10-CM

## 2016-03-12 DIAGNOSIS — E785 Hyperlipidemia, unspecified: Secondary | ICD-10-CM

## 2016-03-12 DIAGNOSIS — Z720 Tobacco use: Secondary | ICD-10-CM

## 2016-03-12 MED ORDER — GLUCOSE BLOOD VI STRP: ORAL_STRIP | Status: DC

## 2016-03-12 MED ORDER — FREESTYLE LANCETS MISC: Status: DC

## 2016-03-12 NOTE — Patient Instructions (Signed)
F/u in 4.5 month, call if you need me sooner  Fasting labs tomorrow. Test strips for 4 times daily testing sent in  Call and schedule f/u with Dr Luretha Murphy are referred to cardiologist  Call [facility] for help with stopping alcohol, you need to do this  You need to stop smoking also  Thanks for choosing [facility], we consider it a privelige to serve you. Excellent BP, and flu vaccine today Steps to Quit Smoking  Smoking tobacco can be harmful to your health and can affect almost every organ in your body. Smoking puts you, and those around you, at risk for developing many serious chronic diseases. Quitting smoking is difficult, but it is one of the best things that you can do for your health. It is never too late to quit. WHAT ARE THE BENEFITS OF QUITTING SMOKING? When you quit smoking, you lower your risk of developing serious diseases and conditions, such as:  Lung cancer or lung disease, such as COPD.  Heart disease.  Stroke.  Heart attack.  Infertility.  Osteoporosis and bone fractures. Additionally, symptoms such as coughing, wheezing, and shortness of breath may get better when you quit. You may also find that you get sick less often because your body is stronger at fighting off colds and infections. If you are pregnant, quitting smoking can help to reduce your chances of having a baby of low birth weight. HOW DO I GET READY TO QUIT? When you decide to quit smoking, create a plan to make sure that you are successful. Before you quit:  Pick a date to quit. Set a date within the next two weeks to give you time to prepare.  Write down the reasons why you are quitting. Keep this list in places where you will see it often, such as on your bathroom mirror or in your car or wallet.  Identify the people, places, things, and activities that make you want to smoke (triggers) and avoid them. Make sure to take these actions:  Throw away all cigarettes at home, at  work, and in your car.  Throw away smoking accessories, such as Scientist, research (medical).  Clean your car and make sure to empty the ashtray.  Clean your home, including curtains and carpets.  Tell your family, friends, and coworkers that you are quitting. Support from your loved ones can make quitting easier.  Talk with your health care provider about your options for quitting smoking.  Find out what treatment options are covered by your health insurance. WHAT STRATEGIES CAN I USE TO QUIT SMOKING?  Talk with your healthcare provider about different strategies to quit smoking. Some strategies include:  Quitting smoking altogether instead of gradually lessening how much you smoke over a period of time. Research shows that quitting "cold [place]" is more successful than gradually quitting.  Attending in-person counseling to help you build problem-solving skills. You are more likely to have success in quitting if you attend several counseling sessions. Even short sessions of 10 minutes can be effective.  Finding resources and support systems that can help you to quit smoking and remain smoke-free after you quit. These resources are most helpful when you use them often. They can include:  Online chats with a Social worker.  Telephone quitlines.  Printed Furniture conservator/restorer.  Support groups or group counseling.  Text messaging programs.  Mobile phone applications.  Taking medicines to help you quit smoking. (If you are pregnant or breastfeeding, talk with your health care  provider first.) Some medicines contain nicotine and some do not. Both types of medicines help with cravings, but the medicines that include nicotine help to relieve withdrawal symptoms. Your health care provider may recommend:  Nicotine patches, gum, or lozenges.  Nicotine inhalers or sprays.  Non-nicotine medicine that is taken by mouth. Talk with your health care provider about combining strategies, such as taking  medicines while you are also receiving in-person counseling. Using these two strategies together makes you more likely to succeed in quitting than if you used either strategy on its own. If you are pregnant or breastfeeding, talk with your health care provider about finding counseling or other support strategies to quit smoking. Do not take medicine to help you quit smoking unless told to do so by your health care provider. WHAT THINGS CAN I DO TO MAKE IT EASIER TO QUIT? Quitting smoking might feel overwhelming at first, but there is a lot that you can do to make it easier. Take these important actions:  Reach out to your family and friends and ask that they support and encourage you during this time. Call telephone quitlines, reach out to support groups, or work with a counselor for support.  Ask people who smoke to avoid smoking around you.  Avoid places that trigger you to smoke, such as bars, parties, or smoke-break areas at work.  Spend time around people who do not smoke.  Lessen stress in your life, because stress can be a smoking trigger for some people. To lessen stress, try:  Exercising regularly.  Deep-breathing exercises.  Yoga.  Meditating.  Performing a body scan. This involves closing your eyes, scanning your body from head to toe, and noticing which parts of your body are particularly tense. Purposefully relax the muscles in those areas.  Download or purchase mobile phone or tablet apps (applications) that can help you stick to your quit plan by providing reminders, tips, and encouragement. There are many free apps, such as QuitGuide from the [employer] Office manager for Disease Control and Prevention). You can find other support for quitting smoking (smoking cessation) through smokefree.gov and other websites. HOW WILL I FEEL WHEN I QUIT SMOKING? Within the first 24 hours of quitting smoking, you may start to feel some withdrawal symptoms. These symptoms are usually most noticeable  2-3 days after quitting, but they usually do not last beyond 2-3 weeks. Changes or symptoms that you might experience include:  Mood swings.  Restlessness, anxiety, or irritation.  Difficulty concentrating.  Dizziness.  Strong cravings for sugary foods in addition to nicotine.  Mild weight gain.  Constipation.  Nausea.  Coughing or a sore throat.  Changes in how your medicines work in your body.  A depressed mood.  Difficulty sleeping (insomnia). After the first 2-3 weeks of quitting, you may start to notice more positive results, such as:  Improved sense of smell and taste.  Decreased coughing and sore throat.  Slower heart rate.  Lower blood pressure.  Clearer skin.  The ability to breathe more easily.  Fewer sick days. Quitting smoking is very challenging for most people. Do not get discouraged if you are not successful the first time. Some people need to make many attempts to quit before they achieve long-term success. Do your best to stick to your quit plan, and talk with your health care provider if you have any questions or concerns.   This information is not intended to replace advice given to you by your health care provider. Make sure you  discuss any questions you have with your health care provider.   Document Released: 11/24/2001 Document Revised: 04/16/2015 Document Reviewed: 04/16/2015 Elsevier Interactive Patient Education [employer].

## 2016-03-12 NOTE — Progress Notes (Signed)
Subjective:    Patient ID: Antonio Cobb, male    DOB: 14-Apr-1961, 55 y.o.   MRN: [id number]  HPI   [name]     MRN: [id number]      DOB: [birth year]   HPI [name] is here for follow up and re-evaluation of chronic medical conditions, medication management and review of any available recent lab and radiology data.  Preventive health is updated, specifically  Cancer screening and Immunization.   Questions or concerns regarding consultations or procedures which the PT has had in the interim are  addressed. The PT denies any adverse reactions to current medications since the last visit.  C/o intermittent chest discomfort , 2 occasions in past 2 weeks, and a;lso a sense of irregularity in heartbeat, " felt as though heart slowed down, when he was feeling temporal pulse because he felt light headed. Recognizes hew need to quit alcohol, admits to being alcoholic , and may be open to getting help to quit. Not testing blood sugar, needs to return to endo, needs test supplies Denies polyuria, polydipsia, blurred vision , or hypoglycemic episodes.   ROS: Denies recent fever or chills. Denies sinus pressure, nasal congestion, ear pain or sore throat. Denies chest congestion, productive cough or wheezing. Denies chest pains, palpitations and leg swelling Denies abdominal pain, nausea, vomiting,diarrhea or constipation.   Denies dysuria, frequency, hesitancy or incontinence. Denies joint pain, swelling and limitation in mobility. Denies headaches, seizures, numbness, or tingling. Denies depression, anxiety or insomnia. Denies skin break down or rash.   PE  BP 118/84 mmHg  Pulse 90  Resp 18  Ht 5\' 10"  (1.778 m)  Wt 203 lb (92.08 kg)  BMI 29.13 kg/m2  SpO2 98%  Patient alert and oriented and in no cardiopulmonary distress.Appears older than stated age 55: No facial asymmetry, EOMI,   oropharynx pink and moist.  Neck supple no JVD, no mass.  Chest: Clear to  auscultation bilaterally.Decreased though adequate air entry  CVS: S1, S2 no murmurs, no S3.Regular rate.  ABD: Soft non tender.   Ext: No edema  MS: Adequate ROM spine, shoulders, hips and knees.  Skin: Intact, no ulcerations or rash noted.  Psych: Good eye contact, normal affect. Memory intact not anxious or depressed appearing.  CNS: CN 2-12 intact, power,  normal throughout.no focal deficits noted.   Assessment & Plan  Essential hypertension Controlled, no change in medication DASH diet and commitment to daily physical activity for a minimum of 30 minutes discussed and encouraged, as a part of hypertension management. The importance of attaining a healthy weight is also discussed.  BP/Weight 03/12/2016 10/09/2015 05/06/2015 03/23/2015 08/21/2014 [id number]  Systolic BP 123456 123456 123456 0000000 [id number] 123456  Diastolic BP 84 80 82 [id number] 72 76 80  Wt. (Lbs) 203 209 205.12 194.8 210 211.08 212  BMI 29.13 29.99 29.43 27.95 30.13 30.29 30.42        NICM (nonischemic cardiomyopathy) The negative impact that ongoing alcohol dependence is explained, states this is a reason to quit alcohol, info provided for out pt rehab  Needs to re e stab;lish with cardilogy, has established CAD, remains at high risk for ACS, due to co morbidities and ongoing nicotine and alcohol use, recent report of new symptoms, will refer  Arteriosclerotic cardiovascular disease (ASCVD) reports recent chest discomfort , needs Cardiology re eval  Tobacco abuse Patient counseled for approximately 5 minutes regarding the health risks of ongoing nicotine use, specifically all types of cancer, heart  disease, stroke and respiratory failure. The options available for help with cessation ,the behavioral changes to assist the process, and the option to either gradully reduce usage  Or abruptly stop.is also discussed. Pt is also encouraged to set specific goals in number of cigarettes used daily, as well as to set a quit  date.     Alcohol abuse Ongoing alcohol dependence , negatively affecting diabetes and heart health, re educated pt re need to quit to improve health and prolong life, also contact info for out pt rehab provided  Diabetes mellitus, insulin dependent (IDDM), uncontrolled Deteriorated, non compliant with care. Needs to re establish with endo and follo management [plan [name] is reminded of the importance of commitment to daily physical activity for 30 minutes or more, as able and the need to limit carbohydrate intake to 30 to 60 grams per meal to help with blood sugar control.   The need to take medication as prescribed, test blood sugar as directed, and to call between visits if there is a concern that blood sugar is uncontrolled is also discussed.   [name] is reminded of the importance of daily foot exam, annual eye examination, and good blood sugar, blood pressure and cholesterol control.  Diabetic Labs Latest Ref Rng 03/12/2016 10/09/2015 05/06/2015 04/03/2015 03/23/2015  HbA1c <5.7 % 11.5(H) 11.2(H) - 14.0(H) -  Microalbumin <2.0 mg/dL - - 0.8 - -  Micro/Creat Ratio 0.0 - 30.0 mg/g - - 6.4 - -  Chol 125 - 200 mg/dL 165 - - 160 -  HDL >=40 mg/dL 33(L) - - 47 -  Calc LDL <130 mg/dL 70 - - 86 -  Triglycerides <150 mg/dL 309(H) - - 137 -  Creatinine 0.70 - 1.33 mg/dL 1.07 1.05 - - 1.19   BP/Weight 03/12/2016 10/09/2015 05/06/2015 03/23/2015 08/21/2014 [id number]  Systolic BP 123456 123456 123456 0000000 [id number] 123456  Diastolic BP 84 80 82 [id number] 72 76 80  Wt. (Lbs) 203 209 205.12 194.8 210 211.08 212  BMI 29.13 29.99 29.43 27.95 30.13 30.29 30.42   Foot/eye exam completion dates Latest Ref Rng 10/10/2015 05/06/2015  Eye Exam No Retinopathy No Retinopathy -  Foot Form Completion - - Done         Hyperlipidemia LDL goal <70 Hyperlipidemia:Low fat diet discussed and encouraged.   Lipid Panel  Lab Results  Component Value Date   CHOL 165 03/12/2016   HDL 33* 03/12/2016   LDLCALC  70 03/12/2016   LDLDIRECT TEST NOT PERFORMED 06/19/2011   TRIG 309* 03/12/2016   CHOLHDL 5.0 03/12/2016   Elevated tG, dietary modification needed, no med change         Review of Systems     Objective:   Physical Exam        Assessment & Plan:

## 2016-03-13 LAB — LIPID PANEL
Cholesterol: 165 mg/dL (ref 125–200)
HDL: 33 mg/dL — ABNORMAL LOW (ref >=40)
LDL CALC: 70 mg/dL (ref <130)
Total CHOL/HDL Ratio: 5 Ratio (ref <=5.0)
Triglycerides: 309 mg/dL — ABNORMAL HIGH (ref <150)
VLDL: 62 mg/dL — AB (ref <30)

## 2016-03-13 LAB — COMPLETE METABOLIC PANEL WITH GFR
ALT: 26 U/L (ref 9–46)
AST: 21 U/L (ref 10–35)
Albumin: 4 g/dL (ref 3.6–5.1)
Alkaline Phosphatase: 82 U/L (ref 40–115)
BUN: 18 mg/dL (ref 7–25)
CHLORIDE: 100 mmol/L (ref 98–110)
CO2: 28 mmol/L (ref 20–31)
CREATININE: 1.07 mg/dL (ref 0.70–1.33)
Calcium: 9.3 mg/dL (ref 8.6–10.3)
GFR, Est African American: >89 mL/min (ref >=60)
GFR, Est Non African American: 78 mL/min (ref >=60)
Glucose, Bld: 276 mg/dL — ABNORMAL HIGH (ref 65–99)
Potassium: 4 mmol/L (ref 3.5–5.3)
SODIUM: 138 mmol/L (ref 135–146)
Total Bilirubin: 1.2 mg/dL (ref 0.2–1.2)
Total Protein: 6.8 g/dL (ref 6.1–8.1)

## 2016-03-13 LAB — TSH: TSH: 1.38 mIU/L (ref 0.40–4.50)

## 2016-03-13 LAB — HEMOGLOBIN A1C
Hgb A1c MFr Bld: 11.5 % — ABNORMAL HIGH (ref <5.7)
MEAN PLASMA GLUCOSE: 283 mg/dL

## 2016-03-15 NOTE — Assessment & Plan Note (Signed)
reports recent chest discomfort , needs Cardiology re eval

## 2016-03-15 NOTE — Assessment & Plan Note (Signed)
Ongoing alcohol dependence , negatively affecting diabetes and heart health, re educated pt re need to quit to improve health and prolong life, also contact info for out pt rehab provided

## 2016-03-15 NOTE — Assessment & Plan Note (Signed)
Hyperlipidemia:Low fat diet discussed and encouraged.   Lipid Panel  Lab Results  Component Value Date   CHOL 165 03/12/2016   HDL 33* 03/12/2016   LDLCALC 70 03/12/2016   LDLDIRECT TEST NOT PERFORMED 06/19/2011   TRIG 309* 03/12/2016   CHOLHDL 5.0 03/12/2016   Elevated tG, dietary modification needed, no med change

## 2016-03-15 NOTE — Assessment & Plan Note (Signed)

## 2016-03-15 NOTE — Assessment & Plan Note (Signed)
Controlled, no change in medication DASH diet and commitment to daily physical activity for a minimum of 30 minutes discussed and encouraged, as a part of hypertension management. The importance of attaining a healthy weight is also discussed.  BP/Weight 03/12/2016 10/09/2015 05/06/2015 03/23/2015 08/21/2014 [id number]  Systolic BP 123456 123456 123456 0000000 [id number] 123456  Diastolic BP 84 80 82 [id number] 72 76 80  Wt. (Lbs) 203 209 205.12 194.8 210 211.08 212  BMI 29.13 29.99 29.43 27.95 30.13 30.29 30.42

## 2016-03-15 NOTE — Assessment & Plan Note (Signed)
The negative impact that ongoing alcohol dependence is explained, states this is a reason to quit alcohol, info provided for out pt rehab  Needs to re e stab;lish with cardilogy, has established CAD, remains at high risk for ACS, due to co morbidities and ongoing nicotine and alcohol use, recent report of new symptoms, will refer

## 2016-03-15 NOTE — Assessment & Plan Note (Signed)
Deteriorated, non compliant with care. Needs to re establish with endo and follo management [plan Antonio Cobb is reminded of the importance of commitment to daily physical activity for 30 minutes or more, as able and the need to limit carbohydrate intake to 30 to 60 grams per meal to help with blood sugar control.   The need to take medication as prescribed, test blood sugar as directed, and to call between visits if there is a concern that blood sugar is uncontrolled is also discussed.   Antonio Cobb is reminded of the importance of daily foot exam, annual eye examination, and good blood sugar, blood pressure and cholesterol control.  Diabetic Labs Latest Ref Rng 03/12/2016 10/09/2015 05/06/2015 04/03/2015 03/23/2015  HbA1c <5.7 % 11.5(H) 11.2(H) - 14.0(H) -  Microalbumin <2.0 mg/dL - - 0.8 - -  Micro/Creat Ratio 0.0 - 30.0 mg/g - - 6.4 - -  Chol 125 - 200 mg/dL 165 - - 160 -  HDL >=40 mg/dL 33(L) - - 47 -  Calc LDL <130 mg/dL 70 - - 86 -  Triglycerides <150 mg/dL 309(H) - - 137 -  Creatinine 0.70 - 1.33 mg/dL 1.07 1.05 - - 1.19   BP/Weight 03/12/2016 10/09/2015 05/06/2015 03/23/2015 08/21/2014 [id number]  Systolic BP 123456 123456 123456 0000000 [id number] [id number]  Diastolic BP 84 80 82 [id number] 72 76 80  Wt. (Lbs) 203 209 205.12 194.8 210 211.08 212  BMI 29.13 29.99 29.43 27.95 30.13 30.29 30.42   Foot/eye exam completion dates Latest Ref Rng 10/10/2015 05/06/2015  Eye Exam No Retinopathy No Retinopathy -  Foot Form Completion - - Done

## 2016-03-16 NOTE — Addendum Note (Signed)
Addended by: Eual Fines on: 03/16/2016 03:58 PM   Modules accepted: Orders

## 2016-03-25 ENCOUNTER — Telehealth: Payer: Self-pay | Admitting: Family Medicine

## 2016-03-25 ENCOUNTER — Other Ambulatory Visit: Payer: Self-pay

## 2016-03-25 DIAGNOSIS — R05 Cough: Secondary | ICD-10-CM

## 2016-03-25 DIAGNOSIS — R059 Cough, unspecified: Secondary | ICD-10-CM

## 2016-03-25 MED ORDER — MONTELUKAST SODIUM 10 MG PO TABS: 10.0000 mg | ORAL_TABLET | Freq: Every day | ORAL | Status: DC

## 2016-03-25 MED ORDER — ALBUTEROL SULFATE HFA 108 (90 BASE) MCG/ACT IN AERS: INHALATION_SPRAY | RESPIRATORY_TRACT | Status: DC

## 2016-03-25 NOTE — Telephone Encounter (Signed)
Antonio Cobb is calling asking if Dr. Moshe Cipro would call [name] in an inhaler, please advise?

## 2016-03-25 NOTE — Telephone Encounter (Signed)
Med refilled.

## 2016-04-07 ENCOUNTER — Telehealth: Payer: Self-pay

## 2016-04-07 NOTE — Telephone Encounter (Signed)
Pt wife called for an appt for asthma flare. SOB and having to sleep propped up on pillows. Advised UC or ER. Wife agrees. If a follow up is recommended to call next week for an appt

## 2016-04-08 ENCOUNTER — Ambulatory Visit: Payer: Self-pay | Admitting: "Endocrinology

## 2016-04-20 ENCOUNTER — Other Ambulatory Visit: Payer: Self-pay

## 2016-04-20 ENCOUNTER — Emergency Department (HOSPITAL_COMMUNITY): Payer: BLUE CROSS/BLUE SHIELD

## 2016-04-20 ENCOUNTER — Emergency Department (HOSPITAL_COMMUNITY)
Admission: EM | Admit: 2016-04-20 | Discharge: 2016-04-20 | Disposition: A | Discharge status: Home / Self Care | Payer: BLUE CROSS/BLUE SHIELD | Attending: Emergency Medicine | Admitting: Emergency Medicine

## 2016-04-20 ENCOUNTER — Encounter (HOSPITAL_COMMUNITY): Payer: Self-pay | Admitting: Emergency Medicine

## 2016-04-20 DIAGNOSIS — E785 Hyperlipidemia, unspecified: Secondary | ICD-10-CM | POA: Diagnosis not present

## 2016-04-20 DIAGNOSIS — R05 Cough: Secondary | ICD-10-CM | POA: Diagnosis present

## 2016-04-20 DIAGNOSIS — Z79899 Other long term (current) drug therapy: Secondary | ICD-10-CM | POA: Diagnosis not present

## 2016-04-20 DIAGNOSIS — E119 Type 2 diabetes mellitus without complications: Secondary | ICD-10-CM | POA: Insufficient documentation

## 2016-04-20 DIAGNOSIS — E669 Obesity, unspecified: Secondary | ICD-10-CM | POA: Insufficient documentation

## 2016-04-20 DIAGNOSIS — I252 Old myocardial infarction: Secondary | ICD-10-CM | POA: Diagnosis not present

## 2016-04-20 DIAGNOSIS — F17290 Nicotine dependence, other tobacco product, uncomplicated: Secondary | ICD-10-CM | POA: Insufficient documentation

## 2016-04-20 DIAGNOSIS — I10 Essential (primary) hypertension: Secondary | ICD-10-CM | POA: Diagnosis not present

## 2016-04-20 DIAGNOSIS — Z7982 Long term (current) use of aspirin: Secondary | ICD-10-CM | POA: Insufficient documentation

## 2016-04-20 DIAGNOSIS — Z794 Long term (current) use of insulin: Secondary | ICD-10-CM | POA: Diagnosis not present

## 2016-04-20 DIAGNOSIS — J40 Bronchitis, not specified as acute or chronic: Secondary | ICD-10-CM | POA: Insufficient documentation

## 2016-04-20 LAB — BASIC METABOLIC PANEL
ANION GAP: 9 (ref 5–15)
BUN: 15 mg/dL (ref 6–20)
CALCIUM: 9 mg/dL (ref 8.9–10.3)
CO2: 26 mmol/L (ref 22–32)
Chloride: 103 mmol/L (ref 101–111)
Creatinine, Ser: 0.96 mg/dL (ref 0.61–1.24)
GFR calc Af Amer: >60 mL/min (ref >60)
Glucose, Bld: 229 mg/dL — ABNORMAL HIGH (ref 65–99)
POTASSIUM: 3.5 mmol/L (ref 3.5–5.1)
Sodium: 138 mmol/L (ref 135–145)

## 2016-04-20 LAB — CBC WITH DIFFERENTIAL/PLATELET
Basophils Absolute: 0.1 10*3/uL (ref 0.0–0.1)
Basophils Relative: 1 %
EOS PCT: 5 %
Eosinophils Absolute: 0.4 10*3/uL (ref 0.0–0.7)
HEMATOCRIT: 44.8 % (ref 39.0–52.0)
HEMOGLOBIN: 15.1 g/dL (ref 13.0–17.0)
LYMPHS PCT: 36 %
Lymphs Abs: 3 10*3/uL (ref 0.7–4.0)
MCH: 29.5 pg (ref 26.0–34.0)
MCHC: 33.7 g/dL (ref 30.0–36.0)
MCV: 87.5 fL (ref 78.0–100.0)
MONO ABS: 0.5 10*3/uL (ref 0.1–1.0)
Monocytes Relative: 6 %
NEUTROS ABS: 4.3 10*3/uL (ref 1.7–7.7)
Neutrophils Relative %: 52 %
Platelets: 225 10*3/uL (ref 150–400)
RBC: 5.12 MIL/uL (ref 4.22–5.81)
RDW: 12.6 % (ref 11.5–15.5)
WBC: 8.2 10*3/uL (ref 4.0–10.5)

## 2016-04-20 LAB — TROPONIN I: Troponin I: 0.03 ng/mL (ref <0.031)

## 2016-04-20 MED ORDER — PREDNISONE 20 MG PO TABS: 40.0000 mg | ORAL_TABLET | Freq: Every day | ORAL | Status: DC

## 2016-04-20 MED ORDER — GUAIFENESIN-CODEINE 100-10 MG/5ML PO SYRP: 10.0000 mL | ORAL_SOLUTION | Freq: Three times a day (TID) | ORAL | Status: DC | PRN

## 2016-04-20 MED ORDER — DOXYCYCLINE HYCLATE 100 MG PO CAPS: 100.0000 mg | ORAL_CAPSULE | Freq: Two times a day (BID) | ORAL | Status: DC

## 2016-04-20 NOTE — ED Provider Notes (Signed)
CSN: [id number]     Arrival date & time 04/20/16  [id number] History   First MD Initiated Contact with Patient 04/20/16 1117     Chief Complaint  Patient presents with  . Cough     (Consider location/radiation/quality/duration/timing/severity/associated sxs/prior Treatment) HPI   Antonio Cobb is a 55 y.o. male with hx of DM, nicotine addiction, MI 2013, HTN who presents to the Emergency Department complaining of persistent shortness of breath and dry cough for 2 weeks.  Remote hx of COPD and uses nebuliziers at home.  He states his symptoms are worse at night and keep him from sleeping.  He reports hx of similar episodes at this time every year.  He was seen at his PMD's office this morning at sent here for evaluation.  He denies relief from nebs, bloody sputum, fever, chills, chest pain.     Past Medical History  Diagnosis Date  . Alcohol use ([place])   . Obesity   . Nicotine addiction   . Hyperlipidemia   . Diabetes mellitus, type 2 ([place])   . Hypertension   . Other and unspecified alcohol dependence, unspecified drinking behavior   . Elevated cholesterol   . Myocardial infarction [facility]) 05/23/12   Past Surgical History  Procedure Laterality Date  . None    . Cardiac catheterization  2 yrs ago  . Polypectomy  10/16/2011    Procedure: POLYPECTOMY;  Surgeon: [name], MD;  Location: [facility];  Service: Endoscopy;;  Polypoid Lesion, Transverse and Sigmoid Colon  . Coronary stent placement  05/23/12  . Left heart catheterization with coronary angiogram N/A 05/23/2012    Procedure: LEFT HEART CATHETERIZATION WITH CORONARY ANGIOGRAM;  Surgeon: [name], MD;  Location: [facility] CATH LAB;  Service: Cardiovascular;  Laterality: N/A;  . Percutaneous coronary stent intervention (pci-s) N/A 05/23/2012    Procedure: PERCUTANEOUS CORONARY STENT INTERVENTION (PCI-S);  Surgeon: [name], MD;  Location: [facility] CATH LAB;  Service: Cardiovascular;  Laterality: N/A;   Family History  Problem  Relation Age of Onset  . Emphysema Mother   . Heart attack Mother   . Hypertension Mother   . Diabetes Mother   . Heart disease Mother   . COPD Mother   . Emphysema Father   . COPD Father   . Stroke Sister   . Asthma Sister   . Colon cancer Neg Hx   . Anesthesia problems Neg Hx   . Hypotension Neg Hx   . Malignant hyperthermia Neg Hx   . Pseudochol deficiency Neg Hx    Social History  Substance Use Topics  . Smoking status: Light Tobacco Smoker -- 1.00 packs/day for 30 years    Types: Cigars  . Smokeless tobacco: Never Used     Comment: since early 40s  . Alcohol Use: Yes     Comment: pint will last 3 days    Review of Systems  Constitutional: Negative for fever, chills and appetite change.  HENT: Positive for congestion. Negative for sore throat and trouble swallowing.   Respiratory: Positive for cough, chest tightness and shortness of breath. Negative for wheezing.   Cardiovascular: Negative for chest pain.  Gastrointestinal: Negative for nausea, vomiting and abdominal pain.  Genitourinary: Negative for dysuria.  Musculoskeletal: Negative for arthralgias.  Skin: Negative for rash.  Neurological: Negative for dizziness, weakness and numbness.  Hematological: Negative for adenopathy.  All other systems reviewed and are negative.     Allergies  Review of patient's allergies indicates no known allergies.  Home  Medications   Prior to Admission medications   Medication Sig Start Date End Date Taking? Authorizing Provider  albuterol (PROVENTIL HFA;VENTOLIN HFA) 108 (90 Base) MCG/ACT inhaler Two puffs before exercise , as needed, for  wheezing 03/25/16   [name], MD  amLODipine (NORVASC) 10 MG tablet take 1 tablet once daily 01/13/16   [name], MD  aspirin (ASPIRIN ADULT LOW STRENGTH) 81 MG EC tablet Take 81 mg by mouth daily.      Historical Provider, MD  atorvastatin (LIPITOR) 80 MG tablet Take 1 tablet (80 mg total) by mouth daily. 08/21/14    Arnoldo Lenis, MD  azelastine (ASTELIN) 0.1 % nasal spray Place 2 sprays into both nostrils 2 (two) times daily. Use in each nostril as directed 05/06/15   [name], MD  budesonide-formoterol [facility]) 80-4.5 MCG/ACT inhaler Inhale 2 puffs into the lungs 2 (two) times daily. 10/09/15   [name], MD  fluticasone (FLONASE) 50 MCG/ACT nasal spray Place 2 sprays into both nostrils daily. 05/06/15   [name], MD  glucose blood test strip Four times daily testing dx [name] 03/12/16   [name], MD  hydrochlorothiazide (HYDRODIURIL) 25 MG tablet take 1 tablet by mouth once daily 04/03/15   [name], MD  insulin aspart (NOVOLOG) 100 UNIT/ML injection Inject 15 Units into the skin 3 (three) times daily before meals. Sliding scale begins at 15 units and increases as CBG dictates 03/23/15   Orpah Greek, MD  ipratropium-albuterol (DUONEB) 0.5-2.5 (3) MG/3ML SOLN Take 3 mLs by nebulization every 6 (six) hours as needed. 10/01/14   [name], MD  irbesartan (AVAPRO) 300 MG tablet take 1 tablet by mouth once daily 01/13/16   [name], MD  Lancets (FREESTYLE) lancets Four times daily testing dx e11.65 03/12/16   [name], MD  metoprolol succinate (TOPROL-XL) 50 MG 24 hr tablet Take 1 tablet (50 mg total) by mouth daily. Take with or immediately following a meal. 04/03/15   [name], MD  montelukast (SINGULAIR) 10 MG tablet Take 1 tablet (10 mg total) by mouth at bedtime. 03/25/16   [name], MD  nitroGLYCERIN (NITROSTAT) 0.4 MG SL tablet Place 1 tablet (0.4 mg total) under the tongue every 5 (five) minutes as needed for chest pain. Patient not taking: Reported on 03/23/2015 12/05/12 07/05/14  Lendon Colonel, NP  predniSONE (STERAPRED UNI-PAK 21 TAB) 5 MG (21) TBPK tablet Take 1 tablet (5 mg total) by mouth as directed. Use as directed 10/09/15   [name], MD  SPIRIVA HANDIHALER 18 MCG inhalation capsule  inhale the contents of one capsule in the handihaler once daily 04/30/15   [name], MD  spironolactone (ALDACTONE) 25 MG tablet take 1 tablet by mouth once daily 04/03/15   [name], MD  tiotropium [facility]) 18 MCG inhalation capsule Place 1 capsule (18 mcg total) into inhaler and inhale daily. 04/29/15   [name], MD  TOUJEO SOLOSTAR 300 UNIT/ML SOPN 60 Units. 04/08/15   Historical Provider, MD   BP 142/99 mmHg  Pulse 94  Temp(Src) 98.1 F (36.7 C) (Oral)  Resp 18  Ht 5\' 10"  (1.778 m)  Wt 92.08 kg  BMI 29.13 kg/m2  SpO2 98% Physical Exam  Constitutional: He is oriented to person, place, and time. He appears well-developed and well-nourished. No distress.  HENT:  Head: Normocephalic and atraumatic.  Right Ear: Tympanic membrane and ear canal normal.  Left Ear: Tympanic membrane and ear canal normal.  Mouth/Throat: Uvula is midline, oropharynx is clear and moist and mucous membranes are normal. No oropharyngeal exudate.  Eyes: EOM are normal. Pupils are equal, round, and reactive to light.  Neck: Normal range of motion, full passive range of motion without pain and phonation normal. Neck supple. No JVD present. No tracheal deviation present.  Cardiovascular: Normal rate, regular rhythm and intact distal pulses.   No murmur heard. Pulmonary/Chest: Effort normal and breath sounds normal. No respiratory distress. He has no wheezes. He has no rales. He exhibits no tenderness.  Lungs clear to ascultation bilaterally  Abdominal: Soft. He exhibits no distension. There is no tenderness. There is no rebound.  Musculoskeletal: Normal range of motion. He exhibits no edema.  No LE edema  Lymphadenopathy:    He has no cervical adenopathy.  Neurological: He is alert and oriented to person, place, and time. He exhibits normal muscle tone. Coordination normal.  Skin: Skin is warm and dry.  Psychiatric: He has a normal mood and affect.  Nursing note and vitals  reviewed.   ED Course  Procedures (including critical care time) Labs Review Labs Reviewed  BASIC METABOLIC PANEL - Abnormal; Notable for the following:    Glucose, Bld 229 (*)    All other components within normal limits  TROPONIN I  CBC WITH DIFFERENTIAL/PLATELET    Imaging Review Dg Chest 2 View  04/20/2016  CLINICAL DATA:  Cough and shortness of breath for 2 weeks. Initial encounter. EXAM: CHEST  2 VIEW COMPARISON:  PA and lateral chest 03/23/2015 and 01/14/2014. FINDINGS: There is some peribronchial thickening. Small linear focus of opacity in the right upper lobe is unchanged and most consistent with scar. No consolidative process, pneumothorax or effusion. No focal bony abnormality. IMPRESSION: Bronchitic change without focal process. Electronically Signed   By: Inge Rise M.D.   On: 04/20/2016 11:12   I have personally reviewed and evaluated these images and lab results as part of my medical decision-making.   EKG Interpretation   Date/Time:  Monday Apr 20 2016 12:01:26 EDT Ventricular Rate:  89 PR Interval:  136 QRS Duration: 78 QT Interval:  378 QTC Calculation: 459 R Axis:   -5 Text Interpretation:  Normal sinus rhythm Nonspecific T wave abnormality  Abnormal ECG No significant change since last tracing Confirmed by LIU MD,  DANA [id number]) on 04/20/2016 12:06:31 PM      MDM   Final diagnoses:  Bronchitis    Pt is well appearing.  Non-toxic.  Vitals stable.  No tachycardia or tachypnea.  Has used albuterol nebs at home this morning w/o relief.  Sent here for eval by PMD.    Lungs are clear. No respiratory distress, or clinical sx's for PNA, ACS or CHF.  Likely bronchitis.  Pt has ambulated in the dept with a steady gait, no hypoxia or respir distress noted.  He appears stable for d/c and continued inhaler use, prednisone and doxy.  Agrees to PMD f/u or ER return if needed   Kem Parkinson, PA-C 04/22/16 2108  Forde Dandy, MD 04/23/16 [phone]

## 2016-04-20 NOTE — ED Notes (Signed)
PT c/o dry painful cough x2 weeks unrelieved by home breathing treatments and inhaler.

## 2016-04-24 ENCOUNTER — Other Ambulatory Visit: Payer: Self-pay

## 2016-04-24 MED ORDER — HYDROCHLOROTHIAZIDE 25 MG PO TABS: ORAL_TABLET | ORAL | Status: DC

## 2016-04-27 ENCOUNTER — Emergency Department (HOSPITAL_COMMUNITY): Payer: BLUE CROSS/BLUE SHIELD

## 2016-04-27 ENCOUNTER — Encounter (HOSPITAL_COMMUNITY): Payer: Self-pay | Admitting: Emergency Medicine

## 2016-04-27 ENCOUNTER — Emergency Department (HOSPITAL_COMMUNITY)
Admission: EM | Admit: 2016-04-27 | Discharge: 2016-04-27 | Disposition: A | Discharge status: Home / Self Care | Payer: BLUE CROSS/BLUE SHIELD | Attending: Emergency Medicine | Admitting: Emergency Medicine

## 2016-04-27 ENCOUNTER — Ambulatory Visit (INDEPENDENT_AMBULATORY_CARE_PROVIDER_SITE_OTHER): Payer: BLUE CROSS/BLUE SHIELD | Admitting: Family Medicine

## 2016-04-27 VITALS — BP 134/82 | HR 97 | Resp 18 | Ht 70.0 in | Wt 209.0 lb

## 2016-04-27 DIAGNOSIS — I10 Essential (primary) hypertension: Secondary | ICD-10-CM

## 2016-04-27 DIAGNOSIS — IMO0002 Reserved for concepts with insufficient information to code with codable children: Secondary | ICD-10-CM

## 2016-04-27 DIAGNOSIS — M7989 Other specified soft tissue disorders: Secondary | ICD-10-CM | POA: Insufficient documentation

## 2016-04-27 DIAGNOSIS — E1065 Type 1 diabetes mellitus with hyperglycemia: Secondary | ICD-10-CM

## 2016-04-27 DIAGNOSIS — F17210 Nicotine dependence, cigarettes, uncomplicated: Secondary | ICD-10-CM | POA: Insufficient documentation

## 2016-04-27 DIAGNOSIS — Z7982 Long term (current) use of aspirin: Secondary | ICD-10-CM | POA: Insufficient documentation

## 2016-04-27 DIAGNOSIS — R0602 Shortness of breath: Secondary | ICD-10-CM

## 2016-04-27 DIAGNOSIS — Z794 Long term (current) use of insulin: Secondary | ICD-10-CM | POA: Diagnosis not present

## 2016-04-27 DIAGNOSIS — I509 Heart failure, unspecified: Secondary | ICD-10-CM | POA: Diagnosis not present

## 2016-04-27 DIAGNOSIS — J42 Unspecified chronic bronchitis: Secondary | ICD-10-CM | POA: Diagnosis not present

## 2016-04-27 DIAGNOSIS — E119 Type 2 diabetes mellitus without complications: Secondary | ICD-10-CM | POA: Insufficient documentation

## 2016-04-27 DIAGNOSIS — Z79899 Other long term (current) drug therapy: Secondary | ICD-10-CM | POA: Diagnosis not present

## 2016-04-27 DIAGNOSIS — E1049 Type 1 diabetes mellitus with other diabetic neurological complication: Secondary | ICD-10-CM

## 2016-04-27 DIAGNOSIS — E669 Obesity, unspecified: Secondary | ICD-10-CM | POA: Diagnosis not present

## 2016-04-27 DIAGNOSIS — E785 Hyperlipidemia, unspecified: Secondary | ICD-10-CM | POA: Diagnosis not present

## 2016-04-27 DIAGNOSIS — I252 Old myocardial infarction: Secondary | ICD-10-CM | POA: Insufficient documentation

## 2016-04-27 DIAGNOSIS — I428 Other cardiomyopathies: Secondary | ICD-10-CM

## 2016-04-27 DIAGNOSIS — I429 Cardiomyopathy, unspecified: Secondary | ICD-10-CM

## 2016-04-27 DIAGNOSIS — R062 Wheezing: Secondary | ICD-10-CM

## 2016-04-27 DIAGNOSIS — I110 Hypertensive heart disease with heart failure: Secondary | ICD-10-CM | POA: Diagnosis not present

## 2016-04-27 DIAGNOSIS — J441 Chronic obstructive pulmonary disease with (acute) exacerbation: Secondary | ICD-10-CM

## 2016-04-27 DIAGNOSIS — Z720 Tobacco use: Secondary | ICD-10-CM

## 2016-04-27 LAB — TROPONIN I: Troponin I: 0.03 ng/mL (ref <0.031)

## 2016-04-27 LAB — CBC WITH DIFFERENTIAL/PLATELET
BASOS ABS: 0 10*3/uL (ref 0.0–0.1)
Basophils Relative: 0 %
Eosinophils Absolute: 0.4 10*3/uL (ref 0.0–0.7)
Eosinophils Relative: 4 %
HEMATOCRIT: 44.1 % (ref 39.0–52.0)
HEMOGLOBIN: 14.3 g/dL (ref 13.0–17.0)
LYMPHS PCT: 30 %
Lymphs Abs: 2.7 10*3/uL (ref 0.7–4.0)
MCH: 28.6 pg (ref 26.0–34.0)
MCHC: 32.4 g/dL (ref 30.0–36.0)
MCV: 88.2 fL (ref 78.0–100.0)
Monocytes Absolute: 0.8 10*3/uL (ref 0.1–1.0)
Monocytes Relative: 9 %
NEUTROS ABS: 5.1 10*3/uL (ref 1.7–7.7)
Neutrophils Relative %: 57 %
PLATELETS: 229 10*3/uL (ref 150–400)
RBC: 5 MIL/uL (ref 4.22–5.81)
RDW: 12.8 % (ref 11.5–15.5)
WBC: 9 10*3/uL (ref 4.0–10.5)

## 2016-04-27 LAB — BASIC METABOLIC PANEL
ANION GAP: 8 (ref 5–15)
BUN: 15 mg/dL (ref 6–20)
CALCIUM: 9.2 mg/dL (ref 8.9–10.3)
CO2: 27 mmol/L (ref 22–32)
Chloride: 101 mmol/L (ref 101–111)
Creatinine, Ser: 0.94 mg/dL (ref 0.61–1.24)
GFR calc Af Amer: >60 mL/min (ref >60)
GLUCOSE: 237 mg/dL — AB (ref 65–99)
Potassium: 3.6 mmol/L (ref 3.5–5.1)
Sodium: 136 mmol/L (ref 135–145)

## 2016-04-27 LAB — BRAIN NATRIURETIC PEPTIDE: B Natriuretic Peptide: 357 pg/mL — ABNORMAL HIGH (ref 0.0–100.0)

## 2016-04-27 MED ORDER — IPRATROPIUM-ALBUTEROL 0.5-2.5 (3) MG/3ML IN SOLN: 3.0000 mL | Freq: Four times a day (QID) | RESPIRATORY_TRACT | Status: DC | PRN

## 2016-04-27 MED ORDER — IPRATROPIUM BROMIDE 0.02 % IN SOLN
0.5000 mg | Freq: Once | RESPIRATORY_TRACT | Status: AC
  Administered 2016-04-27: 0.5 mg via RESPIRATORY_TRACT

## 2016-04-27 MED ORDER — FUROSEMIDE 10 MG/ML IJ SOLN
40.0000 mg | Freq: Once | INTRAMUSCULAR | Status: AC
  Administered 2016-04-27: 40 mg via INTRAVENOUS
  Filled 2016-04-27: qty 4

## 2016-04-27 MED ORDER — FUROSEMIDE 40 MG PO TABS: 40.0000 mg | ORAL_TABLET | Freq: Every day | ORAL | Status: DC

## 2016-04-27 MED ORDER — ALBUTEROL SULFATE (2.5 MG/3ML) 0.083% IN NEBU
2.5000 mg | INHALATION_SOLUTION | Freq: Once | RESPIRATORY_TRACT | Status: AC
  Administered 2016-04-27: 2.5 mg via RESPIRATORY_TRACT

## 2016-04-27 NOTE — ED Notes (Signed)
Pt c/o sob. Sent by Dr. Moshe Cipro. Sx worse x 2 weeks, unable to lay flat. ble edema noted.

## 2016-04-27 NOTE — Patient Instructions (Signed)
F/u in 3 month, call if you need me sooner  If not admiitted vITAL: that you keep appt with cardiology tomorrow  You need to return to the ED for further evaluation, based on c/o nearly passing out today, and symptoms suggestive of decompensated heart failure for the past weeek  Despite the breathing treatment today in the office , you still are not much  Improved  You are expected in the ED, I have spoken to the MD directly

## 2016-04-27 NOTE — Discharge Instructions (Signed)
Lasix as prescribed.  Keep your appointment with cardiology for tomorrow as scheduled.  Return to the ER if symptoms significantly worsen or change.   Heart Failure Heart failure is a condition in which the heart has trouble pumping blood. This means your heart does not pump blood efficiently for your body to work well. In some cases of heart failure, fluid may back up into your lungs or you may have swelling (edema) in your lower legs. Heart failure is usually a long-term (chronic) condition. It is important for you to take good care of yourself and follow your health care provider's treatment plan. CAUSES  Some health conditions can cause heart failure. Those health conditions include:  High blood pressure (hypertension). Hypertension causes the heart muscle to work harder than normal. When pressure in the blood vessels is high, the heart needs to pump (contract) with more force in order to circulate blood throughout the body. High blood pressure eventually causes the heart to become stiff and weak.  Coronary artery disease (CAD). CAD is the buildup of cholesterol and fat (plaque) in the arteries of the heart. The blockage in the arteries deprives the heart muscle of oxygen and blood. This can cause chest pain and may lead to a heart attack. High blood pressure can also contribute to CAD.  Heart attack (myocardial infarction). A heart attack occurs when one or more arteries in the heart become blocked. The loss of oxygen damages the muscle tissue of the heart. When this happens, part of the heart muscle dies. The injured tissue does not contract as well and weakens the heart's ability to pump blood.  Abnormal heart valves. When the heart valves do not open and close properly, it can cause heart failure. This makes the heart muscle pump harder to keep the blood flowing.  Heart muscle disease (cardiomyopathy or myocarditis). Heart muscle disease is damage to the heart muscle from a variety of  causes. These can include drug or alcohol abuse, infections, or unknown reasons. These can increase the risk of heart failure.  Lung disease. Lung disease makes the heart work harder because the lungs do not work properly. This can cause a strain on the heart, leading it to fail.  Diabetes. Diabetes increases the risk of heart failure. High blood sugar contributes to high fat (lipid) levels in the blood. Diabetes can also cause slow damage to tiny blood vessels that carry important nutrients to the heart muscle. When the heart does not get enough oxygen and food, it can cause the heart to become weak and stiff. This leads to a heart that does not contract efficiently.  Other conditions can contribute to heart failure. These include abnormal heart rhythms, thyroid problems, and low blood counts (anemia). Certain unhealthy behaviors can increase the risk of heart failure, including:  Being overweight.  Smoking or chewing tobacco.  Eating foods high in fat and cholesterol.  Abusing illicit drugs or alcohol.  Lacking physical activity. SYMPTOMS  Heart failure symptoms may vary and can be hard to detect. Symptoms may include:  Shortness of breath with activity, such as climbing stairs.  Persistent cough.  Swelling of the feet, ankles, legs, or abdomen.  Unexplained weight gain.  Difficulty breathing when lying flat (orthopnea).  Waking from sleep because of the need to sit up and get more air.  Rapid heartbeat.  Fatigue and loss of energy.  Feeling light-headed, dizzy, or close to fainting.  Loss of appetite.  Nausea.  Increased urination during the night (nocturia).  DIAGNOSIS  A diagnosis of heart failure is based on your history, symptoms, physical examination, and diagnostic tests. Diagnostic tests for heart failure may include:  Echocardiography.  Electrocardiography.  Chest X-ray.  Blood tests.  Exercise stress test.  Cardiac angiography.  Radionuclide  scans. TREATMENT  Treatment is aimed at managing the symptoms of heart failure. Medicines, behavioral changes, or surgical intervention may be necessary to treat heart failure.  Medicines to help treat heart failure may include:  Angiotensin-converting enzyme (ACE) inhibitors. This type of medicine blocks the effects of a blood protein called angiotensin-converting enzyme. ACE inhibitors relax (dilate) the blood vessels and help lower blood pressure.  Angiotensin receptor blockers (ARBs). This type of medicine blocks the actions of a blood protein called angiotensin. Angiotensin receptor blockers dilate the blood vessels and help lower blood pressure.  Water pills (diuretics). Diuretics cause the kidneys to remove salt and water from the blood. The extra fluid is removed through urination. This loss of extra fluid lowers the volume of blood the heart pumps.  Beta blockers. These prevent the heart from beating too fast and improve heart muscle strength.  Digitalis. This increases the force of the heartbeat.  Healthy behavior changes include:  Obtaining and maintaining a healthy weight.  Stopping smoking or chewing tobacco.  Eating heart-healthy foods.  Limiting or avoiding alcohol.  Stopping illicit drug use.  Physical activity as directed by your health care provider.  Surgical treatment for heart failure may include:  A procedure to open blocked arteries, repair damaged heart valves, or remove damaged heart muscle tissue.  A pacemaker to improve heart muscle function and control certain abnormal heart rhythms.  An internal cardioverter defibrillator to treat certain serious abnormal heart rhythms.  A left ventricular assist device (LVAD) to assist the pumping ability of the heart. HOME CARE INSTRUCTIONS   Take medicines only as directed by your health care provider. Medicines are important in reducing the workload of your heart, slowing the progression of heart failure, and  improving your symptoms.  Do not stop taking your medicine unless directed by your health care provider.  Do not skip any dose of medicine.  Refill your prescriptions before you run out of medicine. Your medicines are needed every day.  Engage in moderate physical activity if directed by your health care provider. Moderate physical activity can benefit some people. The elderly and people with severe heart failure should consult with a health care provider for physical activity recommendations.  Eat heart-healthy foods. Food choices should be free of trans fat and low in saturated fat, cholesterol, and salt (sodium). Healthy choices include fresh or frozen fruits and vegetables, fish, lean meats, legumes, fat-free or low-fat dairy products, and whole grain or high fiber foods. Talk to a dietitian to learn more about heart-healthy foods.  Limit sodium if directed by your health care provider. Sodium restriction may reduce symptoms of heart failure in some people. Talk to a dietitian to learn more about heart-healthy seasonings.  Use healthy cooking methods. Healthy cooking methods include roasting, grilling, broiling, baking, poaching, steaming, or stir-frying. Talk to a dietitian to learn more about healthy cooking methods.  Limit fluids if directed by your health care provider. Fluid restriction may reduce symptoms of heart failure in some people.  Weigh yourself every day. Daily weights are important in the early recognition of excess fluid. You should weigh yourself every morning after you urinate and before you eat breakfast. Wear the same amount of clothing each time you weigh yourself.  Record your daily weight. Provide your health care provider with your weight record.  Monitor and record your blood pressure if directed by your health care provider.  Check your pulse if directed by your health care provider.  Lose weight if directed by your health care provider. Weight loss may reduce  symptoms of heart failure in some people.  Stop smoking or chewing tobacco. Nicotine makes your heart work harder by causing your blood vessels to constrict. Do not use nicotine gum or patches before talking to your health care provider.  Keep all follow-up visits as directed by your health care provider. This is important.  Limit alcohol intake to no more than 1 drink per day for nonpregnant women and 2 drinks per day for men. One drink equals 12 ounces of beer, 5 ounces of wine, or 1 ounces of hard liquor. Drinking more than that is harmful to your heart. Tell your health care provider if you drink alcohol several times a week. Talk with your health care provider about whether alcohol is safe for you. If your heart has already been damaged by alcohol or you have severe heart failure, drinking alcohol should be stopped completely.  Stop illicit drug use.  Stay up-to-date with immunizations. It is especially important to prevent respiratory infections through current pneumococcal and influenza immunizations.  Manage other health conditions such as hypertension, diabetes, thyroid disease, or abnormal heart rhythms as directed by your health care provider.  Learn to manage stress.  Plan rest periods when fatigued.  Learn strategies to manage high temperatures. If the weather is extremely hot:  Avoid vigorous physical activity.  Use air conditioning or fans or seek a cooler location.  Avoid caffeine and alcohol.  Wear loose-fitting, lightweight, and light-colored clothing.  Learn strategies to manage cold temperatures. If the weather is extremely cold:  Avoid vigorous physical activity.  Layer clothes.  Wear mittens or gloves, a hat, and a scarf when going outside.  Avoid alcohol.  Obtain ongoing education and support as needed.  Participate in or seek rehabilitation as needed to maintain or improve independence and quality of life. SEEK MEDICAL CARE IF:   You have a rapid  weight gain.  You have increasing shortness of breath that is unusual for you.  You are unable to participate in your usual physical activities.  You tire easily.  You cough more than normal, especially with physical activity.  You have any or more swelling in areas such as your hands, feet, ankles, or abdomen.  You are unable to sleep because it is hard to breathe.  You feel like your heart is beating fast (palpitations).  You become dizzy or light-headed upon standing up. SEEK IMMEDIATE MEDICAL CARE IF:   You have difficulty breathing.  There is a change in mental status such as decreased alertness or difficulty with concentration.  You have a pain or discomfort in your chest.  You have an episode of fainting (syncope). MAKE SURE YOU:   Understand these instructions.  Will watch your condition.  Will get help right away if you are not doing well or get worse.   This information is not intended to replace advice given to you by your health care provider. Make sure you discuss any questions you have with your health care provider.   Document Released: 11/30/2005 Document Revised: 04/16/2015 Document Reviewed: 12/30/2012 Elsevier Interactive Patient Education [employer].

## 2016-04-27 NOTE — ED Provider Notes (Signed)
CSN: [id number]     Arrival date & time 04/27/16  0932 History  By signing my name below, I, Terrance Branch, attest that this documentation has been prepared under the direction and in the presence of Veryl Speak, MD. Electronically Signed: Randa Evens, ED Scribe. 04/27/2016. 10:07 AM.     Chief Complaint  Patient presents with  . Shortness of Breath    The history is provided by the patient. No language interpreter was used.   HPI Comments: Antonio Cobb is a 56 y.o. male who presents to the Emergency Department complaining of worsening SOB onset 3 weeks prior. Pt reports associated cough, chest tightness and lower leg swelling. Pt states that his symptoms are worse when laying flat. Pt states that he has not been complaint with his hydrochlorothiazide today. PT denies CP or other related symptoms.   Past Medical History  Diagnosis Date  . Alcohol use ([place])   . Obesity   . Nicotine addiction   . Hyperlipidemia   . Diabetes mellitus, type 2 ([place])   . Hypertension   . Other and unspecified alcohol dependence, unspecified drinking behavior   . Elevated cholesterol   . Myocardial infarction [facility]) 05/23/12   Past Surgical History  Procedure Laterality Date  . None    . Cardiac catheterization  2 yrs ago  . Polypectomy  10/16/2011    Procedure: POLYPECTOMY;  Surgeon: [name], MD;  Location: [facility];  Service: Endoscopy;;  Polypoid Lesion, Transverse and Sigmoid Colon  . Coronary stent placement  05/23/12  . Left heart catheterization with coronary angiogram N/A 05/23/2012    Procedure: LEFT HEART CATHETERIZATION WITH CORONARY ANGIOGRAM;  Surgeon: [name], MD;  Location: [facility] CATH LAB;  Service: Cardiovascular;  Laterality: N/A;  . Percutaneous coronary stent intervention (pci-s) N/A 05/23/2012    Procedure: PERCUTANEOUS CORONARY STENT INTERVENTION (PCI-S);  Surgeon: [name], MD;  Location: [facility] CATH LAB;  Service: Cardiovascular;  Laterality: N/A;   Family  History  Problem Relation Age of Onset  . Emphysema Mother   . Heart attack Mother   . Hypertension Mother   . Diabetes Mother   . Heart disease Mother   . COPD Mother   . Emphysema Father   . COPD Father   . Stroke Sister   . Asthma Sister   . Colon cancer Neg Hx   . Anesthesia problems Neg Hx   . Hypotension Neg Hx   . Malignant hyperthermia Neg Hx   . Pseudochol deficiency Neg Hx    Social History  Substance Use Topics  . Smoking status: Light Tobacco Smoker -- 1.00 packs/day for 30 years    Types: Cigars  . Smokeless tobacco: Never Used     Comment: since early 27s  . Alcohol Use: Yes     Comment: pint will last 3 days    Review of Systems  Respiratory: Positive for cough, chest tightness and shortness of breath.   Cardiovascular: Positive for leg swelling. Negative for chest pain.  All other systems reviewed and are negative.     Allergies  Review of patient's allergies indicates no known allergies.  Home Medications   Prior to Admission medications   Medication Sig Start Date End Date Taking? Authorizing Provider  albuterol (PROVENTIL HFA;VENTOLIN HFA) 108 (90 Base) MCG/ACT inhaler Two puffs before exercise , as needed, for  wheezing 03/25/16   [name], MD  amLODipine (NORVASC) 10 MG tablet take 1 tablet once daily 01/13/16   Norwood Levo  Moshe Cipro, MD  aspirin (ASPIRIN ADULT LOW STRENGTH) 81 MG EC tablet Take 81 mg by mouth daily.      Historical Provider, MD  atorvastatin (LIPITOR) 80 MG tablet Take 1 tablet (80 mg total) by mouth daily. 08/21/14   Arnoldo Lenis, MD  doxycycline (VIBRAMYCIN) 100 MG capsule Take 1 capsule (100 mg total) by mouth 2 (two) times daily. 04/20/16   Tammy Triplett, PA-C  glucose blood test strip Four times daily testing dx [name] 03/12/16   [name], MD  guaiFENesin-codeine [facility]) 100-10 MG/5ML syrup Take 10 mLs by mouth 3 (three) times daily as needed. 04/20/16   Tammy Triplett, PA-C  hydrochlorothiazide  (HYDRODIURIL) 25 MG tablet take 1 tablet by mouth once daily 04/24/16   [name], MD  insulin aspart (NOVOLOG) 100 UNIT/ML injection Inject 15 Units into the skin 3 (three) times daily before meals. Sliding scale begins at 15 units and increases as CBG dictates Patient taking differently: Inject into the skin 3 (three) times daily before meals. Sliding scale per home instructions 03/23/15   Orpah Greek, MD  Insulin Glargine (LANTUS SOLOSTAR) 100 UNIT/ML Solostar Pen Inject 60 Units into the skin See admin instructions. Everynight    Historical Provider, MD  ipratropium-albuterol (DUONEB) 0.5-2.5 (3) MG/3ML SOLN Take 3 mLs by nebulization every 6 (six) hours as needed. 10/01/14   [name], MD  irbesartan (AVAPRO) 300 MG tablet take 1 tablet by mouth once daily 01/13/16   [name], MD  Lancets (FREESTYLE) lancets Four times daily testing dx e11.65 03/12/16   [name], MD  metoprolol succinate (TOPROL-XL) 50 MG 24 hr tablet Take 1 tablet (50 mg total) by mouth daily. Take with or immediately following a meal. 04/03/15   [name], MD  montelukast (SINGULAIR) 10 MG tablet Take 1 tablet (10 mg total) by mouth at bedtime. 03/25/16   [name], MD  nitroGLYCERIN (NITROSTAT) 0.4 MG SL tablet Place 1 tablet (0.4 mg total) under the tongue every 5 (five) minutes as needed for chest pain. 12/05/12 04/20/16  Lendon Colonel, NP  predniSONE (DELTASONE) 20 MG tablet Take 2 tablets (40 mg total) by mouth daily. For 5 days 04/20/16   Kem Parkinson, PA-C  SPIRIVA HANDIHALER 18 MCG inhalation capsule inhale the contents of one capsule in the handihaler once daily 04/30/15   [name], MD  spironolactone (ALDACTONE) 25 MG tablet take 1 tablet by mouth once daily 04/03/15   [name], MD   BP 142/103 mmHg  Pulse 99  Temp(Src) 97.8 F (36.6 C)  Resp 22  Ht 5\' 9"  (1.753 m)  Wt 209 lb (94.802 kg)  BMI 30.85 kg/m2  SpO2 94%   Physical Exam   Constitutional: He is oriented to person, place, and time. He appears well-developed and well-nourished. No distress.  HENT:  Head: Normocephalic and atraumatic.  Eyes: Conjunctivae and EOM are normal.  Neck: Neck supple. No tracheal deviation present.  Cardiovascular: Normal rate, regular rhythm and normal heart sounds.   No murmur heard. Pulmonary/Chest: Effort normal. No respiratory distress. He has rales.  Rales in bases bilaterally.  Musculoskeletal: Normal range of motion. He exhibits edema.  1+ pitting edema of lower legs.   Neurological: He is alert and oriented to person, place, and time.  Skin: Skin is warm and dry.  Psychiatric: He has a normal mood and affect. His behavior is normal.  Nursing note and vitals reviewed.   ED  Course  Procedures (including critical care time) DIAGNOSTIC STUDIES: Oxygen Saturation is 94% on RA, adequate by my interpretation.    COORDINATION OF CARE: 10:05 AM-Discussed treatment plan with pt at bedside and pt agreed to plan.     Labs Review Labs Reviewed  CBC WITH DIFFERENTIAL/PLATELET  BASIC METABOLIC PANEL  BRAIN NATRIURETIC PEPTIDE  TROPONIN I    Imaging Review No results found. I have personally reviewed and evaluated these images and lab results as part of my medical decision-making.   EKG Interpretation   Date/Time:  Monday Apr 27 2016 09:40:49 EDT Ventricular Rate:  97 PR Interval:  144 QRS Duration: 81 QT Interval:  335 QTC Calculation: 425 R Axis:   0 Text Interpretation:  Sinus rhythm Borderline repolarization abnormality  Confirmed by Lorita Forinash  MD, Heaven Meeker ([id number]) on 04/27/2016 9:57:04 AM      MDM   Final diagnoses:  None   Patient is a 55 year old male sent by his primary Dr. for evaluation of orthopnea and dyspnea on exertion which is been worsening over the past 2 weeks. His history and workup is most consistent with congestive heart failure. His BNP is mildly elevated and chest x-ray shows CHF. He was  treated with Lasix in the ER with a good diuresis. There is no hypoxia and no respiratory distress. There is also no evidence for an acute cardiac event as his EKG is unchanged and troponin is negative. He has a follow-up appointment scheduled tomorrow with cardiology. He appears to be appropriate for discharge. I will prescribe Lasix to be taken at home.   I personally performed the services described in this documentation, which was scribed in my presence. The recorded information has been reviewed and is accurate.       Veryl Speak, MD 04/27/16 1102

## 2016-04-27 NOTE — Progress Notes (Signed)
Subjective:    Patient ID: Antonio Cobb, male    DOB: Jun 03, 1961, 55 y.o.   MRN: [id number]  HPI 1 week h/o worsening cough and shortness of breath. Was in ED 1 week ago and diagnosed with bronchitis. Unable to lie flat, short of breath  With minimal activity, no sputum, reports that he nearly passsed out this morning , and wife met him at the office Notes leg swelling also Reportedly had to be in a scooter at the grocery store 2 days  Ago as unable to walk with excessive fatigue and cough   Review of Systems See HPI  Denies recent fever or chills. Denies sinus pressure, nasal congestion, ear pain or sore throat. c/o chest congestion, and wheezing. a or constipation.   Denies dysuria, frequency, hesitancy or incontinence. Denies joint pain, swelling and limitation in mobility. Denies headaches, seizures, numbness, or tingling. Denies depression, anxiety or insomnia. Denies skin break down or rash.        Objective:   Physical Exam  BP 134/82 mmHg  Pulse 97  Resp 18  Ht _0  (1.778 m)  Wt 209 lb (94.802 kg)  BMI 29.99 kg/m2  SpO2 93%  Patient alert and oriented and in moderately severe cardiopulmonary distress.unable to speak in sentences without coughing  HEENT: No facial asymmetry, EOMI,   oropharynx pink and moist.  Neck supple no JVD, no mass.  Chest: decreased air entry with wheezing  CVS: S1, S2 no murmurs, no S3.Regular rate.  ABD: Soft non tender.   Ext: trace  edema  MS: Adequate ROM spine, shoulders, hips and knees.  Skin: Intact, no ulcerations or rash noted.  Psych: Good eye contact, normal affect. Memory intact not anxious or depressed appearing.  CNS: CN 2-12 intact, power,  normal throughout.no focal deficits noted.      Assessment & Plan:  COPD with exacerbation ([place]) Acute flare with progressive deterioration in respiratory status. Neb treatment in office and sent to ED for further eval and management Neb solution for home use  also sent in  Wheezing Bilateral wheezes with reduced air entry and low oxygen Neb treatment in office  NICM (nonischemic cardiomyopathy) Signs and symptoms of heart failyure. Needs ED evaluation for full evaluation, pt reports near syncope this morning and is in cardiopulmonary distress. Discussed with ED Provider prior to sending to ED with his wife accompanying him  Diabetes mellitus, insulin dependent (IDDM), uncontrolled Non compliant. Uncontrolled, Needs endo management, does not go consistently, in denial of severity iof his illness Antonio Cobb is reminded of the importance of commitment to daily physical activity for 30 minutes or more, as able and the need to limit carbohydrate intake to 30 to 60 grams per meal to help with blood sugar control.   The need to take medication as prescribed, test blood sugar as directed, and to call between visits if there is a concern that blood sugar is uncontrolled is also discussed.   Antonio Cobb is reminded of the importance of daily foot exam, annual eye examination, and good blood sugar, blood pressure and cholesterol control.  Diabetic Labs Latest Ref Rng 04/27/2016 04/20/2016 03/12/2016 10/09/2015 05/06/2015  HbA1c <5.7 % - - 11.5(H) 11.2(H) -  Microalbumin <2.0 mg/dL - - - - 0.8  Micro/Creat Ratio 0.0 - 30.0 mg/g - - - - 6.4  Chol 125 - 200 mg/dL - - 165 - -  HDL >=40 mg/dL - - 33(L) - -  Calc LDL <130 mg/dL - - 70 - -  Triglycerides <150 mg/dL - - 309(H) - -  Creatinine 0.61 - 1.24 mg/dL 0.94 0.96 1.07 1.05 -   BP/Weight 04/27/2016 04/27/2016 04/20/2016 03/12/2016 10/09/2015 4/88/4573 02/14/4829  Systolic BP 159 968 957 022 026 691 675  Diastolic BP 612 82 90 84 80 82 110  Wt. (Lbs) 209 209 203 203 209 205.12 194.8  BMI 30.85 29.99 29.13 29.13 29.99 29.43 27.95   Foot/eye exam completion dates Latest Ref Rng 10/10/2015 05/06/2015  Eye Exam No Retinopathy No Retinopathy -  Foot Form Completion - - Done         Essential  hypertension Controlled, no change in medication  DASH diet and commitment to daily physical activity for a minimum of 30 minutes discussed and encouraged, as a part of hypertension management. The importance of attaining a healthy weight is also discussed.  BP/Weight 04/27/2016 04/27/2016 04/20/2016 03/12/2016 10/09/2015 5/48/3234 05/21/8736  Systolic BP 308 168 387 065 826 088 835  Diastolic BP 844 82 90 84 80 82 110  Wt. (Lbs) 209 209 203 203 209 205.12 194.8  BMI 30.85 29.99 29.13 29.13 29.99 29.43 27.95        Tobacco abuse Patient counseled s regarding the health risks of ongoing nicotine use, specifically all types of cancer, heart disease, stroke and respiratory failure. The options available for help with cessation ,the behavioral changes to assist the process, and the option to either gradully reduce usage  Or abruptly stop.is also discussed. Pt is also encouraged to set specific goals in number of cigarettes used daily, as well as to set a quit date.

## 2016-04-28 ENCOUNTER — Ambulatory Visit (INDEPENDENT_AMBULATORY_CARE_PROVIDER_SITE_OTHER): Payer: BLUE CROSS/BLUE SHIELD | Admitting: Cardiology

## 2016-04-28 ENCOUNTER — Encounter: Payer: Self-pay | Admitting: Family Medicine

## 2016-04-28 ENCOUNTER — Encounter: Payer: Self-pay | Admitting: Cardiology

## 2016-04-28 VITALS — BP 110/76 | HR 97 | Ht 70.0 in | Wt 202.0 lb

## 2016-04-28 DIAGNOSIS — R0602 Shortness of breath: Secondary | ICD-10-CM

## 2016-04-28 DIAGNOSIS — E785 Hyperlipidemia, unspecified: Secondary | ICD-10-CM

## 2016-04-28 DIAGNOSIS — I2510 Atherosclerotic heart disease of native coronary artery without angina pectoris: Secondary | ICD-10-CM

## 2016-04-28 DIAGNOSIS — Z79899 Other long term (current) drug therapy: Secondary | ICD-10-CM | POA: Diagnosis not present

## 2016-04-28 DIAGNOSIS — R062 Wheezing: Secondary | ICD-10-CM | POA: Insufficient documentation

## 2016-04-28 DIAGNOSIS — J441 Chronic obstructive pulmonary disease with (acute) exacerbation: Secondary | ICD-10-CM | POA: Insufficient documentation

## 2016-04-28 DIAGNOSIS — I10 Essential (primary) hypertension: Secondary | ICD-10-CM

## 2016-04-28 MED ORDER — FUROSEMIDE 40 MG PO TABS: ORAL_TABLET | ORAL | Status: DC

## 2016-04-28 NOTE — Assessment & Plan Note (Signed)
Signs and symptoms of heart failyure. Needs ED evaluation for full evaluation, pt reports near syncope this morning and is in cardiopulmonary distress. Discussed with ED Provider prior to sending to ED with his wife accompanying him

## 2016-04-28 NOTE — Progress Notes (Signed)
Patient ID: Antonio Cobb, male   DOB: 02-Apr-1961, 55 y.o.   MRN: [id number]     Clinical Summary Antonio Cobb is a 55 y.o.male seen today for follow up of the following medical problems.   1. CAD - hx of inferolateral STEMI June 2013, received DES to LCX. LVEF at that time 55%.  - he denies any recent chest pain. Has had some recent SOB and LE edema.  - compliant with meds  2. Hyperlipidemia - compliant with statin  3. HTN - does not check at home regularly - compliant with meds  4. COPD - followed by pcp  5. SOB - signifiant SOB at last pcp as well as LE edema and orthopnea. Seen in ER 04/27/16, found to evidence of fluid overload. Managed with IV lasix and discharged home. BNP 357. CXR with pulm edema. Started on oral lasix 40mg  daily at ER discahrge.  - SOB started about 3 weeks. +cough nonproductive. Mild wheezing. Has noted some LE edema 3 days ago. Recently started on prednisone a few days ago. No chest pain. +orthopnea that is new.   Past Medical History  Diagnosis Date  . Alcohol use ([place])   . Obesity   . Nicotine addiction   . Hyperlipidemia   . Diabetes mellitus, type 2 ([place])   . Hypertension   . Other and unspecified alcohol dependence, unspecified drinking behavior   . Elevated cholesterol   . Myocardial infarction ([place]) 05/23/12     No Known Allergies   Current Outpatient Prescriptions  Medication Sig Dispense Refill  . albuterol (PROVENTIL HFA;VENTOLIN HFA) 108 (90 Base) MCG/ACT inhaler Two puffs before exercise , as needed, for  wheezing 18 g 2  . amLODipine (NORVASC) 10 MG tablet take 1 tablet once daily 30 tablet 4  . aspirin (ASPIRIN ADULT LOW STRENGTH) 81 MG EC tablet Take 81 mg by mouth daily.      [name] atorvastatin (LIPITOR) 80 MG tablet Take 1 tablet (80 mg total) by mouth daily. 90 tablet 3  . doxycycline (VIBRAMYCIN) 100 MG capsule Take 1 capsule (100 mg total) by mouth 2 (two) times daily. 20 capsule 0  . furosemide (LASIX) 40 MG tablet Take  1 tablet (40 mg total) by mouth daily. 30 tablet 0  . glucose blood test strip Four times daily testing dx [name] 150 each 1  . guaiFENesin-codeine (ROBITUSSIN AC) 100-10 MG/5ML syrup Take 10 mLs by mouth 3 (three) times daily as needed. 120 mL 0  . hydrochlorothiazide (HYDRODIURIL) 25 MG tablet take 1 tablet by mouth once daily 30 tablet 4  . insulin aspart (NOVOLOG) 100 UNIT/ML injection Inject 15 Units into the skin 3 (three) times daily before meals. Sliding scale begins at 15 units and increases as CBG dictates (Patient taking differently: Inject into the skin 3 (three) times daily before meals. Sliding scale per home instructions) 10 mL 0  . Insulin Glargine (LANTUS SOLOSTAR) 100 UNIT/ML Solostar Pen Inject 40 Units into the skin See admin instructions. Everynight    . ipratropium-albuterol (DUONEB) 0.5-2.5 (3) MG/3ML SOLN Take 3 mLs by nebulization every 6 (six) hours as needed. 360 mL 0  . irbesartan (AVAPRO) 300 MG tablet take 1 tablet by mouth once daily 30 tablet 4  . Lancets (FREESTYLE) lancets Four times daily testing dx e11.65 150 each 1  . metoprolol succinate (TOPROL-XL) 50 MG 24 hr tablet Take 1 tablet (50 mg total) by mouth daily. Take with or immediately following a meal. 90 tablet 3  .  montelukast (SINGULAIR) 10 MG tablet Take 1 tablet (10 mg total) by mouth at bedtime. 30 tablet 5  . nitroGLYCERIN (NITROSTAT) 0.4 MG SL tablet Place 1 tablet (0.4 mg total) under the tongue every 5 (five) minutes as needed for chest pain. 25 tablet 3  . predniSONE (DELTASONE) 20 MG tablet Take 2 tablets (40 mg total) by mouth daily. For 5 days 10 tablet 0  . SPIRIVA HANDIHALER 18 MCG inhalation capsule inhale the contents of one capsule in the handihaler once daily 30 capsule 0  . spironolactone (ALDACTONE) 25 MG tablet take 1 tablet by mouth once daily 30 tablet 4   No current facility-administered medications for this visit.     Past Surgical History  Procedure Laterality Date  . None    .  Cardiac catheterization  2 yrs ago  . Polypectomy  10/16/2011    Procedure: POLYPECTOMY;  Surgeon: [name], MD;  Location: [facility];  Service: Endoscopy;;  Polypoid Lesion, Transverse and Sigmoid Colon  . Coronary stent placement  05/23/12  . Left heart catheterization with coronary angiogram N/A 05/23/2012    Procedure: LEFT HEART CATHETERIZATION WITH CORONARY ANGIOGRAM;  Surgeon: [name], MD;  Location: [facility] CATH LAB;  Service: Cardiovascular;  Laterality: N/A;  . Percutaneous coronary stent intervention (pci-s) N/A 05/23/2012    Procedure: PERCUTANEOUS CORONARY STENT INTERVENTION (PCI-S);  Surgeon: [name], MD;  Location: [facility] CATH LAB;  Service: Cardiovascular;  Laterality: N/A;     No Known Allergies    Family History  Problem Relation Age of Onset  . Emphysema Mother   . Heart attack Mother   . Hypertension Mother   . Diabetes Mother   . Heart disease Mother   . COPD Mother   . Emphysema Father   . COPD Father   . Stroke Sister   . Asthma Sister   . Colon cancer Neg Hx   . Anesthesia problems Neg Hx   . Hypotension Neg Hx   . Malignant hyperthermia Neg Hx   . Pseudochol deficiency Neg Hx      Social History Antonio Cobb reports that he has been smoking Cigars.  He has never used smokeless tobacco. Antonio Cobb reports that he drinks alcohol.   Review of Systems CONSTITUTIONAL: No weight loss, fever, chills, weakness or fatigue.  HEENT: Eyes: No visual loss, blurred vision, double vision or yellow sclerae.No hearing loss, sneezing, congestion, runny nose or sore throat.  SKIN: No rash or itching.  CARDIOVASCULAR: per HPI RESPIRATORY: per HPI.  GASTROINTESTINAL: No anorexia, nausea, vomiting or diarrhea. No abdominal pain or blood.  GENITOURINARY: No burning on urination, no polyuria NEUROLOGICAL: No headache, dizziness, syncope, paralysis, ataxia, numbness or tingling in the extremities. No change in bowel or bladder control.  MUSCULOSKELETAL: No  muscle, back pain, joint pain or stiffness.  LYMPHATICS: No enlarged nodes. No history of splenectomy.  PSYCHIATRIC: No history of depression or anxiety.  ENDOCRINOLOGIC: No reports of sweating, cold or heat intolerance. No polyuria or polydipsia.  [name]   Physical Examination Filed Vitals:   04/28/16 1448  BP: 110/76  Pulse: 97   Filed Vitals:   04/28/16 1448  Height: 5\' 10"  (1.778 m)  Weight: 202 lb (91.627 kg)    Gen: resting comfortably, no acute distress HEENT: no scleral icterus, pupils equal round and reactive, no palptable cervical adenopathy,  CV: RRR, no mr/g, no jvd Resp: Clear to auscultation bilaterally GI: abdomen is soft, non-tender, non-distended, normal bowel sounds, no hepatosplenomegaly MSK: extremities are  warm, trace bilateral edema.  Skin: warm, no rash Neuro:  no focal deficits Psych: appropriate affect   Diagnostic Studies Cath 05/2012 [facility] FINDINGS  Hemodynamics:  AO 119/78  LV 121/71  Coronary angiography:  Coronary dominance: right  Left mainstem: The left main is patent with diffuse nonobstructive plaque. The distal left main has 30% stenosis.  Left anterior descending (LAD): the LAD is patent to the LV apex. The mid LAD has 50-60% stenosis at the origin of the second diagonal branch. The first diagonal is very large in caliber and has no significant obstructive disease.  Left circumflex (LCx): the left circumflex is totally occluded in the mid vessel. There is TIMI 0 flow. Following PCI, there were 2 obtuse marginals and a left posterolateral branch visualized, all of which are patent.  Right coronary artery (RCA): there is a high anterior origin of the RCA. The mid vessel has diffuse plaque estimated at 50-60%. The vessel is dominant. There is diffuse nonobstructive disease throughout. There is a small PDA and small posterolateral branch.  Left ventriculography: Left ventricular systolic function is in the low-normal range, LVEF is  estimated at 55%, there is hypokinesis of the basal and midinferior wall.  PCI Note: Following the diagnostic procedure, the decision was made to proceed with PCI. The patient was loaded with Effient 60 mg. Weight-based bivalirudin was given for anticoagulation. Once a therapeutic ACT was achieved, a 6 [place] XB-LAD guide catheter was inserted. A Cougar coronary guidewire was used to cross the lesion. The lesion was predilated with a 2.5x15 mm balloon. The lesion was then stented with a 3.5x15 mm Resolute drug-eluting stent. Following PCI, there was 0% residual stenosis and TIMI-3 flow. Final angiography confirmed an excellent result. The patient tolerated the procedure well. There were no immediate procedural complications. A TR band was used for radial hemostasis. The patient was transferred to the post catheterization recovery area for further monitoring.  PCI Data:  Vessel - circumflex /Segment - mid  Percent Stenosis (pre) 100  TIMI-flow 0  Stent 3.5 x 15 mm resolute integrity DES  Percent Stenosis (post) 0  TIMI-flow (post) 3  Final Conclusions:  1. Total occlusion of the left circumflex with successful primary PCI using a drug-eluting stent platform  2. Diffuse nonobstructive disease of the right coronary artery and LAD  3. Mild left ventricular dysfunction consistent with inferior MI  Recommendations:  transfer to ICU. Post MI medical therapy will be instituted. Tobacco cessation counseling will be done. Anticipate 48 hour hospitalization if his post MI course is uncomplicated.    Assessment and Plan  1. CAD - no current symptoms - we will continue current meds  2. Hyperlipidemia - continue current staitn  3. HTN - at goal, continue current meds  4. SOB - some clinical evidence of volume overload. We will obtain echo to look for change in cardiac function. He will continue lasix 40mg  daily for 3 more days, then change to prn only. Recently started prednisone for  upper respiraotry symptoms, could also have played some role in fluid retention. - check BMET and Mg in 1 week on diuretic  F/u 2 weeks.       Arnoldo Lenis, M.D

## 2016-04-28 NOTE — Assessment & Plan Note (Signed)
Controlled, no change in medication  DASH diet and commitment to daily physical activity for a minimum of 30 minutes discussed and encouraged, as a part of hypertension management. The importance of attaining a healthy weight is also discussed.  BP/Weight 04/27/2016 04/27/2016 04/20/2016 03/12/2016 10/09/2015 [id number]  Systolic BP [id number] [id number] 123456 [id number]  Diastolic BP A999333 82 90 84 80 82 110  Wt. (Lbs) 209 209 203 203 209 205.12 194.8  BMI 30.85 29.99 29.13 29.13 29.99 29.43 27.95

## 2016-04-28 NOTE — Patient Instructions (Signed)
Medication Instructions:  START LASIX 40 MG DAILY FOR NEXT 3 DAYS- THEN TAKE 40 MG DAILY AS NEEDED   Labwork: Your physician recommends that you return for lab work in: [place]    Testing/Procedures: Your physician has requested that you have an echocardiogram. Echocardiography is a painless test that uses sound waves to create images of your heart. It provides your doctor with information about the size and shape of your heart and how well your heart's chambers and valves are working. This procedure takes approximately one hour. There are no restrictions for this procedure.    Follow-Up: Your physician recommends that you schedule a follow-up appointment in: 2 WEEKS    Any Other Special Instructions Will Be Listed Below (If Applicable).     If you need a refill on your cardiac medications before your next appointment, please call your pharmacy.

## 2016-04-28 NOTE — Assessment & Plan Note (Signed)
Bilateral wheezes with reduced air entry and low oxygen Neb treatment in office

## 2016-04-28 NOTE — Assessment & Plan Note (Addendum)
Patient counseled s regarding the health risks of ongoing nicotine use, specifically all types of cancer, heart disease, stroke and respiratory failure. The options available for help with cessation ,the behavioral changes to assist the process, and the option to either gradully reduce usage  Or abruptly stop.is also discussed. Pt is also encouraged to set specific goals in number of cigarettes used daily, as well as to set a quit date.

## 2016-04-28 NOTE — Assessment & Plan Note (Signed)
Non compliant. Uncontrolled, Needs endo management, does not go consistently, in denial of severity iof his illness Antonio Cobb is reminded of the importance of commitment to daily physical activity for 30 minutes or more, as able and the need to limit carbohydrate intake to 30 to 60 grams per meal to help with blood sugar control.   The need to take medication as prescribed, test blood sugar as directed, and to call between visits if there is a concern that blood sugar is uncontrolled is also discussed.   Antonio Cobb is reminded of the importance of daily foot exam, annual eye examination, and good blood sugar, blood pressure and cholesterol control.  Diabetic Labs Latest Ref Rng 04/27/2016 04/20/2016 03/12/2016 10/09/2015 05/06/2015  HbA1c <5.7 % - - 11.5(H) 11.2(H) -  Microalbumin <2.0 mg/dL - - - - 0.8  Micro/Creat Ratio 0.0 - 30.0 mg/g - - - - 6.4  Chol 125 - 200 mg/dL - - 165 - -  HDL >=40 mg/dL - - 33(L) - -  Calc LDL <130 mg/dL - - 70 - -  Triglycerides <150 mg/dL - - 309(H) - -  Creatinine 0.61 - 1.24 mg/dL 0.94 0.96 1.07 1.05 -   BP/Weight 04/27/2016 04/27/2016 04/20/2016 03/12/2016 10/09/2015 [id number] [id number]  Systolic BP A999333 [id number] [id number] 123456 [id number]  Diastolic BP A999333 82 90 84 80 82 110  Wt. (Lbs) 209 209 203 203 209 205.12 194.8  BMI 30.85 29.99 29.13 29.13 29.99 29.43 27.95   Foot/eye exam completion dates Latest Ref Rng 10/10/2015 05/06/2015  Eye Exam No Retinopathy No Retinopathy -  Foot Form Completion - - Done

## 2016-04-28 NOTE — Assessment & Plan Note (Signed)
Acute flare with progressive deterioration in respiratory status. Neb treatment in office and sent to ED for further eval and management Neb solution for home use also sent in

## 2016-04-29 ENCOUNTER — Ambulatory Visit (HOSPITAL_COMMUNITY)
Admission: RE | Admit: 2016-04-29 | Discharge: 2016-04-29 | Disposition: A | Discharge status: Home / Self Care | Payer: BLUE CROSS/BLUE SHIELD | Source: Ambulatory Visit | Attending: Cardiology | Admitting: Cardiology

## 2016-04-29 DIAGNOSIS — I10 Essential (primary) hypertension: Secondary | ICD-10-CM | POA: Diagnosis not present

## 2016-04-29 DIAGNOSIS — I5020 Unspecified systolic (congestive) heart failure: Secondary | ICD-10-CM | POA: Insufficient documentation

## 2016-04-29 DIAGNOSIS — I429 Cardiomyopathy, unspecified: Secondary | ICD-10-CM | POA: Insufficient documentation

## 2016-04-29 DIAGNOSIS — R0602 Shortness of breath: Secondary | ICD-10-CM | POA: Insufficient documentation

## 2016-04-29 DIAGNOSIS — J449 Chronic obstructive pulmonary disease, unspecified: Secondary | ICD-10-CM | POA: Diagnosis not present

## 2016-04-29 DIAGNOSIS — E785 Hyperlipidemia, unspecified: Secondary | ICD-10-CM | POA: Insufficient documentation

## 2016-04-29 DIAGNOSIS — I340 Nonrheumatic mitral (valve) insufficiency: Secondary | ICD-10-CM | POA: Insufficient documentation

## 2016-04-29 DIAGNOSIS — Z720 Tobacco use: Secondary | ICD-10-CM | POA: Diagnosis not present

## 2016-04-29 DIAGNOSIS — Z87891 Personal history of nicotine dependence: Secondary | ICD-10-CM | POA: Diagnosis not present

## 2016-04-29 DIAGNOSIS — E119 Type 2 diabetes mellitus without complications: Secondary | ICD-10-CM | POA: Insufficient documentation

## 2016-04-30 ENCOUNTER — Telehealth: Payer: Self-pay

## 2016-04-30 NOTE — Telephone Encounter (Signed)
-----   Message from Arnoldo Lenis, MD sent at 04/30/2016  1:37 PM EDT ----- Echo shows that the pumping function of his heart has decreased, this is probably why he is more short of breath. We will discuss some medications changes we will need to make at our next visit in the next 2 weeks, and also discuss potential further testing on his heart. For the time being he is to continue his current meds    Zandra Abts MD

## 2016-04-30 NOTE — Telephone Encounter (Signed)
Called pt. Went straight to voicemail. lmtcb 5/18- lm

## 2016-05-12 ENCOUNTER — Ambulatory Visit (INDEPENDENT_AMBULATORY_CARE_PROVIDER_SITE_OTHER): Payer: BLUE CROSS/BLUE SHIELD | Admitting: Adult Health

## 2016-05-12 ENCOUNTER — Encounter: Payer: Self-pay | Admitting: *Deleted

## 2016-05-12 ENCOUNTER — Encounter: Payer: Self-pay | Admitting: Adult Health

## 2016-05-12 ENCOUNTER — Other Ambulatory Visit (HOSPITAL_COMMUNITY)
Admission: RE | Admit: 2016-05-12 | Discharge: 2016-05-12 | Disposition: A | Discharge status: Home / Self Care | Payer: BLUE CROSS/BLUE SHIELD | Source: Ambulatory Visit | Attending: Adult Health | Admitting: Adult Health

## 2016-05-12 VITALS — BP 140/90 | HR 89 | Ht 70.0 in | Wt 205.0 lb

## 2016-05-12 DIAGNOSIS — Z5181 Encounter for therapeutic drug level monitoring: Secondary | ICD-10-CM | POA: Insufficient documentation

## 2016-05-12 DIAGNOSIS — Z01810 Encounter for preprocedural cardiovascular examination: Secondary | ICD-10-CM | POA: Diagnosis not present

## 2016-05-12 DIAGNOSIS — I2510 Atherosclerotic heart disease of native coronary artery without angina pectoris: Secondary | ICD-10-CM | POA: Diagnosis not present

## 2016-05-12 DIAGNOSIS — Z79899 Other long term (current) drug therapy: Secondary | ICD-10-CM | POA: Insufficient documentation

## 2016-05-12 LAB — BASIC METABOLIC PANEL
Anion gap: 8 (ref 5–15)
BUN: 18 mg/dL (ref 6–20)
CO2: 27 mmol/L (ref 22–32)
CREATININE: 0.81 mg/dL (ref 0.61–1.24)
Calcium: 9.2 mg/dL (ref 8.9–10.3)
Chloride: 104 mmol/L (ref 101–111)
GFR calc Af Amer: >60 mL/min (ref >60)
GLUCOSE: 96 mg/dL (ref 65–99)
POTASSIUM: 3.1 mmol/L — AB (ref 3.5–5.1)
SODIUM: 139 mmol/L (ref 135–145)

## 2016-05-12 LAB — CBC WITH DIFFERENTIAL/PLATELET
BASOS PCT: 1 %
Basophils Absolute: 0.1 10*3/uL (ref 0.0–0.1)
EOS ABS: 0.5 10*3/uL (ref 0.0–0.7)
Eosinophils Relative: 5 %
HEMATOCRIT: 41.5 % (ref 39.0–52.0)
HEMOGLOBIN: 14 g/dL (ref 13.0–17.0)
LYMPHS ABS: 3.7 10*3/uL (ref 0.7–4.0)
Lymphocytes Relative: 40 %
MCH: 29 pg (ref 26.0–34.0)
MCHC: 33.7 g/dL (ref 30.0–36.0)
MCV: 85.9 fL (ref 78.0–100.0)
MONO ABS: 0.8 10*3/uL (ref 0.1–1.0)
MONOS PCT: 9 %
NEUTROS ABS: 4.2 10*3/uL (ref 1.7–7.7)
NEUTROS PCT: 45 %
Platelets: 273 10*3/uL (ref 150–400)
RBC: 4.83 MIL/uL (ref 4.22–5.81)
RDW: 12.2 % (ref 11.5–15.5)
WBC: 9.2 10*3/uL (ref 4.0–10.5)

## 2016-05-12 LAB — PROTIME-INR
INR: 0.91 (ref 0.00–1.49)
PROTHROMBIN TIME: 12.5 s (ref 11.6–15.2)

## 2016-05-12 MED ORDER — FUROSEMIDE 40 MG PO TABS: 40.0000 mg | ORAL_TABLET | Freq: Every day | ORAL | Status: DC

## 2016-05-12 NOTE — Patient Instructions (Signed)
Your physician recommends that you schedule a follow-up appointment in: 2 Weeks   Your physician has recommended you make the following change in your medication:   Take Lasix 40 mg Daily  Your physician has requested that you have a cardiac catheterization. Cardiac catheterization is used to diagnose and/or treat various heart conditions. Doctors may recommend this procedure for a number of different reasons. The most common reason is to evaluate chest pain. Chest pain can be a symptom of coronary artery disease (CAD), and cardiac catheterization can show whether plaque is narrowing or blocking your heart's arteries. This procedure is also used to evaluate the valves, as well as measure the blood flow and oxygen levels in different parts of your heart. For further information please visit [email or web address]. Please follow instruction sheet, as given.  If you need a refill on your cardiac medications before your next appointment, please call your pharmacy.  Thank you for choosing [facility]!

## 2016-05-12 NOTE — Progress Notes (Signed)
Name: Antonio Cobb    DOB: July 15, 1961  Age: 55 y.o.  MR#: [id number]       PCP:  Tula Nakayama, MD      Insurance: Payor: [employer] / Plan: [employer] / Product Type: *No Product type* /   CC:   No chief complaint on file.   VS Filed Vitals:   05/12/16 1502  BP: 140/90  Pulse: 89  Height: 5\' 10"  (1.778 m)  Weight: 205 lb (92.987 kg)  SpO2: 98%    Weights Current Weight  05/12/16 205 lb (92.987 kg)  04/28/16 202 lb (91.627 kg)  04/27/16 209 lb (94.802 kg)    Blood Pressure  BP Readings from Last 3 Encounters:  05/12/16 140/90  04/28/16 110/76  04/27/16 142/102     Admit date:  (Not on file) Last encounter with RMR:  Visit date not found   Allergy Review of patient's allergies indicates no known allergies.  Current Outpatient Prescriptions  Medication Sig Dispense Refill  . albuterol (PROVENTIL HFA;VENTOLIN HFA) 108 (90 Base) MCG/ACT inhaler Two puffs before exercise , as needed, for  wheezing 18 g 2  . amLODipine (NORVASC) 10 MG tablet take 1 tablet once daily 30 tablet 4  . aspirin (ASPIRIN ADULT LOW STRENGTH) 81 MG EC tablet Take 81 mg by mouth daily.      [name] atorvastatin (LIPITOR) 80 MG tablet Take 1 tablet (80 mg total) by mouth daily. 90 tablet 3  . furosemide (LASIX) 40 MG tablet TAKE 40 MG DAILY FOR NEXT THREE DAYS. THEN TAKE 40 MG DAILY AS NEEDED. 30 tablet 3  . glucose blood test strip Four times daily testing dx [name] 150 each 1  . hydrochlorothiazide (HYDRODIURIL) 25 MG tablet take 1 tablet by mouth once daily 30 tablet 4  . insulin aspart (NOVOLOG) 100 UNIT/ML injection Inject 15 Units into the skin 3 (three) times daily before meals. Sliding scale begins at 15 units and increases as CBG dictates (Patient taking differently: Inject into the skin 3 (three) times daily before meals. Sliding scale per home instructions) 10 mL 0  . Insulin Glargine (LANTUS SOLOSTAR) 100 UNIT/ML Solostar Pen Inject 40 Units into the skin See admin instructions.  Everynight    . ipratropium-albuterol (DUONEB) 0.5-2.5 (3) MG/3ML SOLN Take 3 mLs by nebulization every 6 (six) hours as needed. 360 mL 0  . irbesartan (AVAPRO) 300 MG tablet take 1 tablet by mouth once daily 30 tablet 4  . Lancets (FREESTYLE) lancets Four times daily testing dx e11.65 150 each 1  . metoprolol succinate (TOPROL-XL) 50 MG 24 hr tablet Take 1 tablet (50 mg total) by mouth daily. Take with or immediately following a meal. 90 tablet 3  . montelukast (SINGULAIR) 10 MG tablet Take 1 tablet (10 mg total) by mouth at bedtime. 30 tablet 5  . SPIRIVA HANDIHALER 18 MCG inhalation capsule inhale the contents of one capsule in the handihaler once daily 30 capsule 0  . spironolactone (ALDACTONE) 25 MG tablet take 1 tablet by mouth once daily 30 tablet 4  . nitroGLYCERIN (NITROSTAT) 0.4 MG SL tablet Place 1 tablet (0.4 mg total) under the tongue every 5 (five) minutes as needed for chest pain. 25 tablet 3   No current facility-administered medications for this visit.    Discontinued Meds:    Medications Discontinued During This Encounter  Medication Reason  . doxycycline (VIBRAMYCIN) 100 MG capsule Error  . guaiFENesin-codeine (ROBITUSSIN AC) 100-10 MG/5ML syrup Error  . predniSONE (DELTASONE)  20 MG tablet Error    Patient Active Problem List   Diagnosis Date Noted  . Wheezing 04/28/2016  . COPD with exacerbation ([place]) 04/28/2016  . Acute asthma flare 10/09/2015  . Seasonal allergies 05/06/2015  . Cough 05/10/2013  . NICM (nonischemic cardiomyopathy) ([employer]) 05/23/2012  . Tobacco abuse 05/23/2012  . Alcohol abuse 05/23/2012  . Arteriosclerotic cardiovascular disease (ASCVD) 05/23/2012  . ED (erectile dysfunction) 05/10/2012  . Overweight 08/12/2010  . Chronic bronchitis ([place]) 05/09/2009  . Diabetes mellitus, insulin dependent (IDDM), uncontrolled ([place]) 03/08/2008  . Hyperlipidemia LDL goal <70 03/08/2008  . Essential hypertension 03/08/2008    LABS    Component Value  Date/Time   NA 136 04/27/2016 0943   NA 138 04/20/2016 1206   NA 138 03/12/2016 1111   K 3.6 04/27/2016 0943   K 3.5 04/20/2016 1206   K 4.0 03/12/2016 1111   CL 101 04/27/2016 0943   CL 103 04/20/2016 1206   CL 100 03/12/2016 1111   CO2 27 04/27/2016 0943   CO2 26 04/20/2016 1206   CO2 28 03/12/2016 1111   GLUCOSE 237* 04/27/2016 0943   GLUCOSE 229* 04/20/2016 1206   GLUCOSE 276* 03/12/2016 1111   BUN 15 04/27/2016 0943   BUN 15 04/20/2016 1206   BUN 18 03/12/2016 1111   CREATININE 0.94 04/27/2016 0943   CREATININE 0.96 04/20/2016 1206   CREATININE 1.07 03/12/2016 1111   CREATININE 1.05 10/09/2015 1648   CREATININE 1.19 03/23/2015 1720   CREATININE 1.12 07/05/2014 1643   CALCIUM 9.2 04/27/2016 0943   CALCIUM 9.0 04/20/2016 1206   CALCIUM 9.3 03/12/2016 1111   GFRNONAA >60 04/27/2016 0943   GFRNONAA >60 04/20/2016 1206   GFRNONAA 78 03/12/2016 1111   GFRNONAA 80 10/09/2015 1648   GFRNONAA 68* 03/23/2015 1720   GFRNONAA 74 07/05/2014 1643   GFRAA >60 04/27/2016 0943   GFRAA >60 04/20/2016 1206   GFRAA >89 03/12/2016 1111   GFRAA >89 10/09/2015 1648   GFRAA 78* 03/23/2015 1720   GFRAA 86 07/05/2014 1643   CMP     Component Value Date/Time   NA 136 04/27/2016 0943   K 3.6 04/27/2016 0943   CL 101 04/27/2016 0943   CO2 27 04/27/2016 0943   GLUCOSE 237* 04/27/2016 0943   BUN 15 04/27/2016 0943   CREATININE 0.94 04/27/2016 0943   CREATININE 1.07 03/12/2016 1111   CALCIUM 9.2 04/27/2016 0943   PROT 6.8 03/12/2016 1111   ALBUMIN 4.0 03/12/2016 1111   AST 21 03/12/2016 1111   ALT 26 03/12/2016 1111   ALKPHOS 82 03/12/2016 1111   BILITOT 1.2 03/12/2016 1111   GFRNONAA >60 04/27/2016 0943   GFRNONAA 78 03/12/2016 1111   GFRAA >60 04/27/2016 0943   GFRAA >89 03/12/2016 1111       Component Value Date/Time   WBC 9.0 04/27/2016 0943   WBC 8.2 04/20/2016 1206   WBC 8.4 03/23/2015 1720   HGB 14.3 04/27/2016 0943   HGB 15.1 04/20/2016 1206   HGB 15.3 03/23/2015  1720   HCT 44.1 04/27/2016 0943   HCT 44.8 04/20/2016 1206   HCT 44.2 03/23/2015 1720   MCV 88.2 04/27/2016 0943   MCV 87.5 04/20/2016 1206   MCV 85.7 03/23/2015 1720    Lipid Panel     Component Value Date/Time   CHOL 165 03/12/2016 1111   TRIG 309* 03/12/2016 1111   HDL 33* 03/12/2016 1111   CHOLHDL 5.0 03/12/2016 1111   VLDL 62* 03/12/2016 1111   LDLCALC  70 03/12/2016 1111   LDLDIRECT TEST NOT PERFORMED 06/19/2011 1230    ABG    Component Value Date/Time   TCO2 29 03/07/2008 0840     Lab Results  Component Value Date   TSH 1.38 03/12/2016   BNP (last 3 results)  Recent Labs  04/27/16 0945  BNP 357.0*    ProBNP (last 3 results) No results for input(s): PROBNP in the last 8760 hours.  Cardiac Panel (last 3 results) No results for input(s): CKTOTAL, CKMB, TROPONINI, RELINDX in the last 72 hours.  Iron/TIBC/Ferritin/ %Sat No results found for: IRON, TIBC, FERRITIN, IRONPCTSAT   EKG Orders placed or performed during the hospital encounter of 04/27/16  . ED EKG  . ED EKG  . EKG 12-Lead  . EKG 12-Lead  . EKG     Prior Assessment and Plan Problem List as of 05/12/2016      Cardiovascular and Mediastinum   Essential hypertension   Last Assessment & Plan 04/27/2016 Office Visit Written 04/28/2016  8:04 AM by [name], MD    Controlled, no change in medication  DASH diet and commitment to daily physical activity for a minimum of 30 minutes discussed and encouraged, as a part of hypertension management. The importance of attaining a healthy weight is also discussed.  BP/Weight 04/27/2016 04/27/2016 04/20/2016 03/12/2016 10/09/2015 [id number] [id number]  Systolic BP A999333 [id number] 123456 123456 [id number] 0000000  Diastolic BP A999333 82 90 84 80 82 110  Wt. (Lbs) 209 209 203 203 209 205.12 194.8  BMI 30.85 29.99 29.13 29.13 29.99 29.43 27.95            NICM (nonischemic cardiomyopathy) [facility])   Last Assessment & Plan 04/27/2016 Office Visit Written 04/28/2016  8:00 AM by  [name], MD    Signs and symptoms of heart failyure. Needs ED evaluation for full evaluation, pt reports near syncope this morning and is in cardiopulmonary distress. Discussed with ED Provider prior to sending to ED with his wife accompanying him      Arteriosclerotic cardiovascular disease (ASCVD)   Last Assessment & Plan 03/12/2016 Office Visit Written 03/15/2016  8:24 PM by [name], MD    reports recent chest discomfort , needs Cardiology re eval        Respiratory   Chronic bronchitis [facility])   Last Assessment & Plan 05/06/2015 Office Visit Written 06/09/2015  7:44 PM by [name], MD    Uncontrolled likley has COPD due to long h/o nicotine which persists      Seasonal allergies   Last Assessment & Plan 05/06/2015 Office Visit Written 06/09/2015  7:45 PM by [name], MD    Uncontrolled , start daily meds and aggressive short course of steroids also daily saline flushes of nares      Acute asthma flare   Last Assessment & Plan 10/09/2015 Office Visit Written 10/14/2015  5:14 AM by [name], MD    Neb treatment in office and IM depo medrol followed by prednisone dose pack. Re educated re need for daily spirometry to help with asthma evaluation from home so less chance of acute decompensation, also need to use daily prophylactic medication      COPD with exacerbation [facility])   Last Assessment & Plan 04/27/2016 Office Visit Written 04/28/2016  7:58 AM by [name], MD    Acute flare with progressive deterioration in respiratory status. Neb treatment in office and sent to ED for further eval and management Neb  solution for home use also sent in        Endocrine   Diabetes mellitus, insulin dependent (IDDM), uncontrolled [facility])   Last Assessment & Plan 04/27/2016 Office Visit Written 04/28/2016  8:01 AM by [name], MD    Non compliant. Uncontrolled, Needs endo management, does not go consistently, in denial of severity iof  his illness Mr. Presswood is reminded of the importance of commitment to daily physical activity for 30 minutes or more, as able and the need to limit carbohydrate intake to 30 to 60 grams per meal to help with blood sugar control.   The need to take medication as prescribed, test blood sugar as directed, and to call between visits if there is a concern that blood sugar is uncontrolled is also discussed.   Mr. Plachy is reminded of the importance of daily foot exam, annual eye examination, and good blood sugar, blood pressure and cholesterol control.  Diabetic Labs Latest Ref Rng 04/27/2016 04/20/2016 03/12/2016 10/09/2015 05/06/2015  HbA1c <5.7 % - - 11.5(H) 11.2(H) -  Microalbumin <2.0 mg/dL - - - - 0.8  Micro/Creat Ratio 0.0 - 30.0 mg/g - - - - 6.4  Chol 125 - 200 mg/dL - - 165 - -  HDL >=40 mg/dL - - 33(L) - -  Calc LDL <130 mg/dL - - 70 - -  Triglycerides <150 mg/dL - - 309(H) - -  Creatinine 0.61 - 1.24 mg/dL 0.94 0.96 1.07 1.05 -   BP/Weight 04/27/2016 04/27/2016 04/20/2016 03/12/2016 10/09/2015 [id number] [id number]  Systolic BP A999333 [id number] 123456 123456 123456 0000000  Diastolic BP A999333 82 90 84 80 82 110  Wt. (Lbs) 209 209 203 203 209 205.12 194.8  BMI 30.85 29.99 29.13 29.13 29.99 29.43 27.95   Foot/eye exam completion dates Latest Ref Rng 10/10/2015 05/06/2015  Eye Exam No Retinopathy No Retinopathy -  Foot Form Completion - - Done               Genitourinary   ED (erectile dysfunction)   Last Assessment & Plan 05/10/2012 Office Visit Written 05/15/2012  9:59 AM by [name], MD    Deterioration in performance, requests medication trial, wife present and nbi agreement        Other   Hyperlipidemia LDL goal <70   Last Assessment & Plan 03/12/2016 Office Visit Written 03/15/2016  8:30 PM by [name], MD    Hyperlipidemia:Low fat diet discussed and encouraged.   Lipid Panel  Lab Results  Component Value Date   CHOL 165 03/12/2016   HDL 33* 03/12/2016   LDLCALC 70  03/12/2016   LDLDIRECT TEST NOT PERFORMED 06/19/2011   TRIG 309* 03/12/2016   CHOLHDL 5.0 03/12/2016   Elevated tG, dietary modification needed, no med change         Overweight   Last Assessment & Plan 10/09/2015 Office Visit Written 10/14/2015  5:19 AM by [name], MD    Deteriorated. Patient re-educated about  the importance of commitment to a  minimum of 150 minutes of exercise per week.  The importance of healthy food choices with portion control discussed. Encouraged to start a food diary, count calories and to consider  joining a support group. Sample diet sheets offered. Goals set by the patient for the next several months.   Weight /BMI 10/09/2015 05/06/2015 03/23/2015  WEIGHT 209 lb 205 lb 1.9 oz 194 lb 12.8 oz  HEIGHT 5\' 10"  5\' 10"  5\' 10"   BMI 29.99 kg/m2 29.43  kg/m2 27.95 kg/m2          Tobacco abuse   Last Assessment & Plan 04/27/2016 Office Visit Edited 04/28/2016  8:04 AM by [name], MD    Patient counseled s regarding the health risks of ongoing nicotine use, specifically all types of cancer, heart disease, stroke and respiratory failure. The options available for help with cessation ,the behavioral changes to assist the process, and the option to either gradully reduce usage  Or abruptly stop.is also discussed. Pt is also encouraged to set specific goals in number of cigarettes used daily, as well as to set a quit date.         Alcohol abuse   Last Assessment & Plan 03/12/2016 Office Visit Written 03/15/2016  8:26 PM by [name], MD    Ongoing alcohol dependence , negatively affecting diabetes and heart health, re educated pt re need to quit to improve health and prolong life, also contact info for out pt rehab provided      Cough   Last Assessment & Plan 05/10/2013 Office Visit Written 05/14/2013  9:32 PM by [name], MD    H/o exercise induce cough, proventil prescribed for use before exercise      Wheezing   Last  Assessment & Plan 04/27/2016 Office Visit Written 04/28/2016  7:59 AM by [name], MD    Bilateral wheezes with reduced air entry and low oxygen Neb treatment in office          Imaging: Dg Chest 2 View  04/27/2016  CLINICAL DATA:  Cough for 2 weeks.  Shortness of breath. EXAM: CHEST  2 VIEW COMPARISON:  Apr 20, 2016 and January 14, 2014 FINDINGS: There is diffuse interstitial edema with a small right pleural effusion. There is atelectasis in the right upper lobe. Heart is mildly enlarged with mild pulmonary venous hypertension. No adenopathy evident. No bone lesions. IMPRESSION: Evidence of a degree of congestive heart failure. Right upper lobe atelectasis. No airspace consolidation. Electronically Signed   By: Lowella Grip III M.D.   On: 04/27/2016 10:38   Dg Chest 2 View  04/20/2016  CLINICAL DATA:  Cough and shortness of breath for 2 weeks. Initial encounter. EXAM: CHEST  2 VIEW COMPARISON:  PA and lateral chest 03/23/2015 and 01/14/2014. FINDINGS: There is some peribronchial thickening. Small linear focus of opacity in the right upper lobe is unchanged and most consistent with scar. No consolidative process, pneumothorax or effusion. No focal bony abnormality. IMPRESSION: Bronchitic change without focal process. Electronically Signed   By: Inge Rise M.D.   On: 04/20/2016 11:12

## 2016-05-12 NOTE — Progress Notes (Signed)
Cardiology Office Note   Date:  05/12/2016   ID:  Antonio Cobb, DOB 13-Jan-1961, MRN [id number]  PCP:  Antonio Nakayama, MD  Cardiologist: Antonio Spring, NP   No chief complaint on file.     History of Present Illness: Antonio Cobb is a 55 y.o. male who presents for ongoing assessment and management of coronary artery disease with history of inferior lateral ST elevation MI in June of 2013, with drug-eluting stents the left circumflex, hyperlipidemia, hypertension, COPD, and chronic shortness of breath, and chronic diastolic heart failure.  The patient was seen in the emergency room in May 2017 with pulmonary edema and started on oral Lasix. He was seen by Dr. Harl Cobb on 04/28/2016 on followup.Antonio Cobb repeat echocardiogram was ordered, Lasix was increased to 40 mg daily for 3 days and then to when necessary only. It was noted that he had been on prednisone for upper respiratory symptoms which may have contributed to fluid retention. The patient was scheduled  For a llowup BMET and magnesium as well.  Echocardiogram 04/29/2016 Left ventricle: The cavity size was normal. Wall thickness was  increased in a pattern of mild LVH. Systolic function was  severely reduced. The estimated ejection fraction was in the  range of 30% to 35%. Doppler parameters are consistent with  abnormal left ventricular relaxation (grade 1 diastolic  dysfunction). Doppler parameters are consistent with high  ventricular filling pressure. - Regional wall motion abnormality: Akinesis of the mid  inferoseptal, mid inferior, and mid inferolateral myocardium;  severe hypokinesis of the basal anterior, apical inferior, and  mid anterolateral myocardium. - Aortic valve: Mildly to moderately calcified annulus. Trileaflet. - Mitral valve: Calcified annulus. There was mild regurgitation. - Left atrium: The atrium was mildly dilated.  Labs on 04/27/2016: Sodium 136, potassium 3.6, chloride 101, CO2  27, creatinine 0.94. BNP 357.   He today short of breath, with some mild lower extremity edema he denies chest pain, but does have significant fatigue and dyspnea on exertion. He states he is unable to climb stairs without becoming very short of breath and having to stop.  Past Medical History  Diagnosis Date  . Alcohol use ([place])   . Obesity   . Nicotine addiction   . Hyperlipidemia   . Diabetes mellitus, type 2 ([place])   . Hypertension   . Other and unspecified alcohol dependence, unspecified drinking behavior   . Elevated cholesterol   . Myocardial infarction [facility]) 05/23/12    Past Surgical History  Procedure Laterality Date  . None    . Cardiac catheterization  2 yrs ago  . Polypectomy  10/16/2011    Procedure: POLYPECTOMY;  Surgeon: Antonio Peng, MD;  Location: [facility];  Service: Endoscopy;;  Polypoid Lesion, Transverse and Sigmoid Colon  . Coronary stent placement  05/23/12  . Left heart catheterization with coronary angiogram N/A 05/23/2012    Procedure: LEFT HEART CATHETERIZATION WITH CORONARY ANGIOGRAM;  Surgeon: Antonio Mocha, MD;  Location[facility] C[facility] LAB;  Service: Cardiovascular;  Laterality: N/A;  . Percutaneous coronary stent intervention (pci-s) N/A 05/23/2012    Procedure: PERCUTANEOUS CORONARY STENT INTERVENTION (PCI-S);  Surgeon: Antonio Mocha, MD;  Location: [facility] CATH LAB;  Service: Cardiovascular;  Laterality: N/A;     Current Outpatient Prescriptions  Medication Sig Dispense Refill  . albuterol (PROVENTIL HFA;VENTOLIN HFA) 108 (90 Base) MCG/ACT inhaler Two puffs before exercise , as needed, for  wheezing 18 g 2  . amLODipine (NORVASC) 10 MG tablet take 1 tablet once  daily 30 tablet 4  . aspirin (ASPIRIN ADULT LOW STRENGTH) 81 MG EC tablet Take 81 mg by mouth daily.      Antonio Kitchen atorvastatin (LIPITOR) 80 MG tablet Take 1 tablet (80 mg total) by mouth daily. 90 tablet 3  . furosemide (LASIX) 40 MG tablet TAKE 40 MG DAILY FOR NEXT THREE DAYS. THEN TAKE 40 MG DAILY AS NEEDED.  30 tablet 3  . glucose blood test strip Four times daily testing dx [name] 150 each 1  . hydrochlorothiazide (HYDRODIURIL) 25 MG tablet take 1 tablet by mouth once daily 30 tablet 4  . insulin aspart (NOVOLOG) 100 UNIT/ML injection Inject 15 Units into the skin 3 (three) times daily before meals. Sliding scale begins at 15 units and increases as CBG dictates (Patient taking differently: Inject into the skin 3 (three) times daily before meals. Sliding scale per home instructions) 10 mL 0  . Insulin Glargine (LANTUS SOLOSTAR) 100 UNIT/ML Solostar Pen Inject 40 Units into the skin See admin instructions. Everynight    . ipratropium-albuterol (DUONEB) 0.5-2.5 (3) MG/3ML SOLN Take 3 mLs by nebulization every 6 (six) hours as needed. 360 mL 0  . irbesartan (AVAPRO) 300 MG tablet take 1 tablet by mouth once daily 30 tablet 4  . Lancets (FREESTYLE) lancets Four times daily testing dx e11.65 150 each 1  . metoprolol succinate (TOPROL-XL) 50 MG 24 hr tablet Take 1 tablet (50 mg total) by mouth daily. Take with or immediately following a meal. 90 tablet 3  . montelukast (SINGULAIR) 10 MG tablet Take 1 tablet (10 mg total) by mouth at bedtime. 30 tablet 5  . SPIRIVA HANDIHALER 18 MCG inhalation capsule inhale the contents of one capsule in the handihaler once daily 30 capsule 0  . spironolactone (ALDACTONE) 25 MG tablet take 1 tablet by mouth once daily 30 tablet 4  . nitroGLYCERIN (NITROSTAT) 0.4 MG SL tablet Place 1 tablet (0.4 mg total) under the tongue every 5 (five) minutes as needed for chest pain. 25 tablet 3   No current facility-administered medications for this visit.    Allergies:   Review of patient's allergies indicates no known allergies.    Social History:  The patient  reports that he has been smoking Cigars.  He has never used smokeless tobacco. He reports that he drinks alcohol. He reports that he does not use illicit drugs.   Family History:  The patient's family history includes Asthma  in his sister; COPD in his father and mother; Diabetes in his mother; Emphysema in his father and mother; Heart attack in his mother; Heart disease in his mother; Hypertension in his mother; Stroke in his sister. There is no history of Colon cancer, Anesthesia problems, Hypotension, Malignant hyperthermia, or Pseudochol deficiency.    ROS: All other systems are reviewed and negative. Unless otherwise mentioned in H&P    PHYSICAL EXAM: VS:  BP 140/90 mmHg  Pulse 89  Ht 5\' 10"  (1.778 m)  Wt 205 lb (92.987 kg)  BMI 29.41 kg/m2  SpO2 98% , BMI Body mass index is 29.41 kg/(m^2). GEN: Well nourished, well developed, in no acute distress HEENT: normal Neck: mild JVD, carotid bruits, or masses Cardiac: RRR;tachycardic, no murmurs, rubs, or gallops, 1+edema  Respiratory: bibasilar crackles, with frequent coughing on inspiration GI: soft, nontender, nondistended, + BS MS: no deformity or atrophy Skin: warm and dry, no rash Neuro:  Strength and sensation are intact Psych: euthymic mood, full affect   Recent Labs: 03/12/2016: ALT 26; TSH 1.38  04/27/2016: B Natriuretic Peptide 357.0*; BUN 15; Creatinine, Ser 0.94; Hemoglobin 14.3; Platelets 229; Potassium 3.6; Sodium 136    Lipid Panel    Component Value Date/Time   CHOL 165 03/12/2016 1111   TRIG 309* 03/12/2016 1111   HDL 33* 03/12/2016 1111   CHOLHDL 5.0 03/12/2016 1111   VLDL 62* 03/12/2016 1111   LDLCALC 70 03/12/2016 1111   LDLDIRECT TEST NOT PERFORMED 06/19/2011 1230      Wt Readings from Last 3 Encounters:  05/12/16 205 lb (92.987 kg)  04/28/16 202 lb (91.627 kg)  04/27/16 209 lb (94.802 kg)      Other studies Reviewed: Additional studies/ records that were reviewed today include: left heart cardiac cath June 2013 Review of the above records demonstrates: diffuse nonobstructive plaque, calcification and plaque is significant for his age. Mild/moderate LV dysfunction felt to be nonischemic. PTCA and stenting of the head  left circumflex. In   ASSESSMENT AND PLAN:  1.  Systolic dysfunction: Significantly reduced from cardiac catheterization completed June 2013 with EF about 30-35%. The patient is symptomatic with dyspnea on exertion fluid retention and fatigue.the patient will have Lasix increased to 40 mg daily and senna when necessary. He will continue BB,  spironolactone, ASA. He is advised to take his medicines when he returns home as he has not taken them as directed today.  2. CAD: history of drug-eluting stent to the mid circumflex in 2013. Due to reduced ejection fraction systolic function symptoms and known history of CAD the patient will be scheduled for a right and left heart catheterization. This has been discussed with Dr. Harl Cobb who is in agreement with this plan, I have talked with the patient and his wife explaining the procedure risks and benefits and answering multiple questions. He is willing to proceed and this is scheduled for Friday, June 2 with Dr.Varonasi. In the interim he is advised to take metoprolol as directed. Continue statin and avoid high cholesterol foods.  3. Hypertension: blood pressure is not well controlled for her reduced systolic function. The patient Lasix will be given daily. Will not increase his ARB at this time.  4. Ongoing tobacco abuse:the patient is advised on smoking cessation.  Current medicines are reviewed at length with the patient today.    Labs/ tests ordered today include: right and left heart cath with pre-atheterization labs  No orders of the defined types were placed in this encounter.     Disposition:   FU with  Cardiology post procedure.  Signed, Jory Sims, NP  05/12/2016 3:16 PM    [facility] [address] [place], [place] [address] Phone: [phone]; Fax: [phone]

## 2016-05-14 ENCOUNTER — Telehealth: Payer: Self-pay | Admitting: *Deleted

## 2016-05-14 DIAGNOSIS — E876 Hypokalemia: Secondary | ICD-10-CM

## 2016-05-14 MED ORDER — POTASSIUM CHLORIDE CRYS ER 20 MEQ PO TBCR: 20.0000 meq | EXTENDED_RELEASE_TABLET | Freq: Every day | ORAL | Status: DC

## 2016-05-14 NOTE — Telephone Encounter (Signed)
-----   Message from Lendon Colonel, NP sent at 05/14/2016  6:59 AM EDT ----- Please start him on potassium, 41mEq daily. Have BMET ordered for am of cath please (May 15, 2016).

## 2016-05-15 ENCOUNTER — Encounter (HOSPITAL_COMMUNITY)
Admission: RE | Disposition: A | Discharge status: Home / Self Care | Payer: Self-pay | Source: Ambulatory Visit | Attending: Interventional Cardiology

## 2016-05-15 ENCOUNTER — Ambulatory Visit (HOSPITAL_COMMUNITY)
Admission: RE | Admit: 2016-05-15 | Discharge: 2016-05-15 | Disposition: A | Discharge status: Home / Self Care | Payer: BLUE CROSS/BLUE SHIELD | Source: Ambulatory Visit | Attending: Interventional Cardiology | Admitting: Interventional Cardiology

## 2016-05-15 ENCOUNTER — Telehealth: Payer: Self-pay | Admitting: Adult Health

## 2016-05-15 ENCOUNTER — Encounter (HOSPITAL_COMMUNITY): Payer: Self-pay | Admitting: Interventional Cardiology

## 2016-05-15 DIAGNOSIS — E669 Obesity, unspecified: Secondary | ICD-10-CM | POA: Insufficient documentation

## 2016-05-15 DIAGNOSIS — Z794 Long term (current) use of insulin: Secondary | ICD-10-CM | POA: Insufficient documentation

## 2016-05-15 DIAGNOSIS — J449 Chronic obstructive pulmonary disease, unspecified: Secondary | ICD-10-CM | POA: Insufficient documentation

## 2016-05-15 DIAGNOSIS — I2510 Atherosclerotic heart disease of native coronary artery without angina pectoris: Secondary | ICD-10-CM

## 2016-05-15 DIAGNOSIS — I252 Old myocardial infarction: Secondary | ICD-10-CM | POA: Diagnosis not present

## 2016-05-15 DIAGNOSIS — F17290 Nicotine dependence, other tobacco product, uncomplicated: Secondary | ICD-10-CM | POA: Diagnosis not present

## 2016-05-15 DIAGNOSIS — I5021 Acute systolic (congestive) heart failure: Secondary | ICD-10-CM | POA: Insufficient documentation

## 2016-05-15 DIAGNOSIS — Z6829 Body mass index (BMI) 29.0-29.9, adult: Secondary | ICD-10-CM | POA: Insufficient documentation

## 2016-05-15 DIAGNOSIS — Z01810 Encounter for preprocedural cardiovascular examination: Secondary | ICD-10-CM

## 2016-05-15 DIAGNOSIS — Z7982 Long term (current) use of aspirin: Secondary | ICD-10-CM | POA: Insufficient documentation

## 2016-05-15 DIAGNOSIS — Z955 Presence of coronary angioplasty implant and graft: Secondary | ICD-10-CM | POA: Insufficient documentation

## 2016-05-15 DIAGNOSIS — I110 Hypertensive heart disease with heart failure: Secondary | ICD-10-CM | POA: Insufficient documentation

## 2016-05-15 DIAGNOSIS — I272 Other secondary pulmonary hypertension: Secondary | ICD-10-CM | POA: Insufficient documentation

## 2016-05-15 DIAGNOSIS — R931 Abnormal findings on diagnostic imaging of heart and coronary circulation: Secondary | ICD-10-CM | POA: Insufficient documentation

## 2016-05-15 DIAGNOSIS — E7800 Pure hypercholesterolemia, unspecified: Secondary | ICD-10-CM | POA: Diagnosis not present

## 2016-05-15 DIAGNOSIS — Z8249 Family history of ischemic heart disease and other diseases of the circulatory system: Secondary | ICD-10-CM | POA: Insufficient documentation

## 2016-05-15 DIAGNOSIS — E785 Hyperlipidemia, unspecified: Secondary | ICD-10-CM | POA: Insufficient documentation

## 2016-05-15 DIAGNOSIS — E119 Type 2 diabetes mellitus without complications: Secondary | ICD-10-CM | POA: Diagnosis not present

## 2016-05-15 DIAGNOSIS — I429 Cardiomyopathy, unspecified: Secondary | ICD-10-CM | POA: Insufficient documentation

## 2016-05-15 DIAGNOSIS — Z7952 Long term (current) use of systemic steroids: Secondary | ICD-10-CM | POA: Diagnosis not present

## 2016-05-15 DIAGNOSIS — I5043 Acute on chronic combined systolic (congestive) and diastolic (congestive) heart failure: Secondary | ICD-10-CM | POA: Insufficient documentation

## 2016-05-15 DIAGNOSIS — Z7289 Other problems related to lifestyle: Secondary | ICD-10-CM | POA: Insufficient documentation

## 2016-05-15 HISTORY — PX: CARDIAC CATHETERIZATION: SHX172

## 2016-05-15 LAB — BASIC METABOLIC PANEL
Anion gap: 8 (ref 5–15)
BUN: 24 mg/dL — AB (ref 6–20)
CO2: 28 mmol/L (ref 22–32)
Calcium: 9.1 mg/dL (ref 8.9–10.3)
Chloride: 97 mmol/L — ABNORMAL LOW (ref 101–111)
Creatinine, Ser: 1.31 mg/dL — ABNORMAL HIGH (ref 0.61–1.24)
GFR calc Af Amer: >60 mL/min (ref >60)
GFR, EST NON AFRICAN AMERICAN: 60 mL/min — AB (ref >60)
GLUCOSE: 281 mg/dL — AB (ref 65–99)
POTASSIUM: 3.5 mmol/L (ref 3.5–5.1)
Sodium: 133 mmol/L — ABNORMAL LOW (ref 135–145)

## 2016-05-15 LAB — POCT I-STAT 3, VENOUS BLOOD GAS (G3P V)
Acid-Base Excess: 2 mmol/L (ref 0.0–2.0)
BICARBONATE: 27.9 meq/L — AB (ref 20.0–24.0)
O2 Saturation: 66 %
PCO2 VEN: 46.4 mmHg (ref 45.0–50.0)
TCO2: 29 mmol/L (ref 0–100)
pH, Ven: 7.386 — ABNORMAL HIGH (ref 7.250–7.300)
pO2, Ven: 35 mmHg (ref 31.0–45.0)

## 2016-05-15 LAB — POCT I-STAT 3, ART BLOOD GAS (G3+)
Acid-Base Excess: 2 mmol/L (ref 0.0–2.0)
BICARBONATE: 26.4 meq/L — AB (ref 20.0–24.0)
O2 Saturation: 95 %
PH ART: 7.417 (ref 7.350–7.450)
PO2 ART: 73 mmHg — AB (ref 80.0–100.0)
TCO2: 28 mmol/L (ref 0–100)
pCO2 arterial: 40.9 mmHg (ref 35.0–45.0)

## 2016-05-15 LAB — GLUCOSE, CAPILLARY: Glucose-Capillary: 268 mg/dL — ABNORMAL HIGH (ref 65–99)

## 2016-05-15 SURGERY — RIGHT/LEFT HEART CATH AND CORONARY ANGIOGRAPHY
Anesthesia: LOCAL

## 2016-05-15 MED ORDER — IOPAMIDOL (ISOVUE-370) INJECTION 76%
INTRAVENOUS | Status: AC
  Filled 2016-05-15: qty 100

## 2016-05-15 MED ORDER — SODIUM CHLORIDE 0.9% FLUSH: 3.0000 mL | Freq: Two times a day (BID) | INTRAVENOUS | Status: DC

## 2016-05-15 MED ORDER — HEPARIN SODIUM (PORCINE) 1000 UNIT/ML IJ SOLN
INTRAMUSCULAR | Status: DC | PRN
  Administered 2016-05-15: 5000 [IU] via INTRAVENOUS

## 2016-05-15 MED ORDER — SODIUM CHLORIDE 0.9 % IV BOLUS (SEPSIS)
250.0000 mL | Freq: Once | INTRAVENOUS | Status: AC
  Administered 2016-05-15: 250 mL via INTRAVENOUS

## 2016-05-15 MED ORDER — MIDAZOLAM HCL 2 MG/2ML IJ SOLN
INTRAMUSCULAR | Status: AC
  Filled 2016-05-15: qty 2

## 2016-05-15 MED ORDER — LIDOCAINE HCL (PF) 1 % IJ SOLN
INTRAMUSCULAR | Status: DC | PRN
  Administered 2016-05-15: 2 mL
  Administered 2016-05-15: 1 mL

## 2016-05-15 MED ORDER — HEPARIN SODIUM (PORCINE) 1000 UNIT/ML IJ SOLN
INTRAMUSCULAR | Status: AC
  Filled 2016-05-15: qty 1

## 2016-05-15 MED ORDER — SODIUM CHLORIDE 0.9 % IV SOLN: 250.0000 mL | INTRAVENOUS | Status: DC | PRN

## 2016-05-15 MED ORDER — HEPARIN (PORCINE) IN NACL 2-0.9 UNIT/ML-% IJ SOLN
INTRAMUSCULAR | Status: AC
  Filled 2016-05-15: qty 1000

## 2016-05-15 MED ORDER — ASPIRIN 81 MG PO CHEW
81.0000 mg | CHEWABLE_TABLET | Freq: Once | ORAL | Status: AC
  Administered 2016-05-15: 81 mg via ORAL

## 2016-05-15 MED ORDER — SODIUM CHLORIDE 0.9 % IV SOLN
INTRAVENOUS | Status: DC
  Administered 2016-05-15: 06:00:00 via INTRAVENOUS

## 2016-05-15 MED ORDER — VERAPAMIL HCL 2.5 MG/ML IV SOLN
INTRAVENOUS | Status: AC
  Filled 2016-05-15: qty 2

## 2016-05-15 MED ORDER — FENTANYL CITRATE (PF) 100 MCG/2ML IJ SOLN
INTRAMUSCULAR | Status: AC
  Filled 2016-05-15: qty 2

## 2016-05-15 MED ORDER — MIDAZOLAM HCL 2 MG/2ML IJ SOLN
INTRAMUSCULAR | Status: DC | PRN
  Administered 2016-05-15: 1 mg via INTRAVENOUS

## 2016-05-15 MED ORDER — VERAPAMIL HCL 2.5 MG/ML IV SOLN
INTRAVENOUS | Status: DC | PRN
  Administered 2016-05-15: 10 mL via INTRA_ARTERIAL

## 2016-05-15 MED ORDER — LIDOCAINE HCL (PF) 1 % IJ SOLN
INTRAMUSCULAR | Status: AC
  Filled 2016-05-15: qty 30

## 2016-05-15 MED ORDER — FENTANYL CITRATE (PF) 100 MCG/2ML IJ SOLN
INTRAMUSCULAR | Status: DC | PRN
  Administered 2016-05-15: 25 ug via INTRAVENOUS

## 2016-05-15 MED ORDER — IOPAMIDOL (ISOVUE-370) INJECTION 76%
INTRAVENOUS | Status: DC | PRN
  Administered 2016-05-15: 80 mL via INTRAVENOUS

## 2016-05-15 MED ORDER — HEPARIN (PORCINE) IN NACL 2-0.9 UNIT/ML-% IJ SOLN
INTRAMUSCULAR | Status: DC | PRN
  Administered 2016-05-15: 1000 mL

## 2016-05-15 MED ORDER — SODIUM CHLORIDE 0.9% FLUSH: 3.0000 mL | INTRAVENOUS | Status: DC | PRN

## 2016-05-15 MED ORDER — SPIRONOLACTONE 25 MG PO TABS: 25.0000 mg | ORAL_TABLET | Freq: Every day | ORAL | Status: DC

## 2016-05-15 MED ORDER — ASPIRIN 81 MG PO CHEW
CHEWABLE_TABLET | ORAL | Status: AC
  Administered 2016-05-15: 81 mg via ORAL
  Filled 2016-05-15: qty 1

## 2016-05-15 MED ORDER — SODIUM CHLORIDE 0.9 % IV SOLN: INTRAVENOUS | Status: AC

## 2016-05-15 SURGICAL SUPPLY — 12 items
CATH BALLN WEDGE 5F 110CM (CATHETERS) ×2 IMPLANT
CATH INFINITI 5 FR JL3.5 (CATHETERS) ×2 IMPLANT
CATH INFINITI 5 FR MPA2 (CATHETERS) ×2 IMPLANT
CATH INFINITI JR4 5F (CATHETERS) ×2 IMPLANT
DEVICE RAD COMP TR BAND LRG (VASCULAR PRODUCTS) ×2 IMPLANT
GLIDESHEATH SLEND SS 6F .021 (SHEATH) ×4 IMPLANT
KIT HEART LEFT (KITS) ×2 IMPLANT
PACK CARDIAC CATHETERIZATION (CUSTOM PROCEDURE TRAY) ×2 IMPLANT
SHEATH FAST CATH BRACH 5F 5CM (SHEATH) ×2 IMPLANT
TRANSDUCER W/STOPCOCK (MISCELLANEOUS) ×2 IMPLANT
TUBING CIL FLEX 10 FLL-RA (TUBING) ×2 IMPLANT
WIRE SAFE-T 1.5MM-J .035X260CM (WIRE) ×2 IMPLANT

## 2016-05-15 NOTE — Discharge Instructions (Signed)
Radial Site Care °Refer to this sheet in the next few weeks. These instructions provide you with information about caring for yourself after your procedure. Your health care provider may also give you more specific instructions. Your treatment has been planned according to current medical practices, but problems sometimes occur. Call your health care provider if you have any problems or questions after your procedure. °WHAT TO EXPECT AFTER THE PROCEDURE °After your procedure, it is typical to have the following: °· Bruising at the radial site that usually fades within 1-2 weeks. °· Blood collecting in the tissue (hematoma) that may be painful to the touch. It should usually decrease in size and tenderness within 1-2 weeks. °HOME CARE INSTRUCTIONS °· Take medicines only as directed by your health care provider. °· You may shower 24-48 hours after the procedure or as directed by your health care provider. Remove the bandage (dressing) and gently wash the site with plain soap and water. Pat the area dry with a clean towel. Do not rub the site, because this may cause bleeding. °· Do not take baths, swim, or use a hot tub until your health care provider approves. °· Check your insertion site every day for redness, swelling, or drainage. °· Do not apply powder or lotion to the site. °· Do not flex or bend the affected arm for 24 hours or as directed by your health care provider. °· Do not push or pull heavy objects with the affected arm for 24 hours or as directed by your health care provider. °· Do not lift over 10 lb (4.5 kg) for 5 days after your procedure or as directed by your health care provider. °· Ask your health care provider when it is okay to: °¨ Return to work or school. °¨ Resume usual physical activities or sports. °¨ Resume sexual activity. °· Do not drive home if you are discharged the same day as the procedure. Have someone else drive you. °· You may drive 24 hours after the procedure unless otherwise  instructed by your health care provider. °· Do not operate machinery or power tools for 24 hours after the procedure. °· If your procedure was done as an outpatient procedure, which means that you went home the same day as your procedure, a responsible adult should be with you for the first 24 hours after you arrive home. °· Keep all follow-up visits as directed by your health care provider. This is important. °SEEK MEDICAL CARE IF: °· You have a fever. °· You have chills. °· You have increased bleeding from the radial site. Hold pressure on the site. °SEEK IMMEDIATE MEDICAL CARE IF: °· You have unusual pain at the radial site. °· You have redness, warmth, or swelling at the radial site. °· You have drainage (other than a small amount of blood on the dressing) from the radial site. °· The radial site is bleeding, and the bleeding does not stop after 30 minutes of holding steady pressure on the site. °· Your arm or hand becomes pale, cool, tingly, or numb. °  °This information is not intended to replace advice given to you by your health care provider. Make sure you discuss any questions you have with your health care provider. °  °Document Released: 01/02/2011 Document Revised: 12/21/2014 Document Reviewed: 06/18/2014 °Elsevier Interactive Patient Education ©2016 [employer] ° ° ° ° °Angiogram, Care After °Refer to this sheet in the next few weeks. These instructions provide you with information about caring for yourself after your procedure.   Your health care provider may also give you more specific instructions. Your treatment has been planned according to current medical practices, but problems sometimes occur. Call your health care provider if you have any problems or questions after your procedure. °WHAT TO EXPECT AFTER THE PROCEDURE °After your procedure, it is typical to have the following: °· Bruising at the catheter insertion site that usually fades within 1-2 weeks. °· Blood collecting in the tissue  (hematoma) that may be painful to the touch. It should usually decrease in size and tenderness within 1-2 weeks. °HOME CARE INSTRUCTIONS °· Take medicines only as directed by your health care provider. °· You may shower 24-48 hours after the procedure or as directed by your health care provider. Remove the bandage (dressing) and gently wash the site with plain soap and water. Pat the area dry with a clean towel. Do not rub the site, because this may cause bleeding. °· Do not take baths, swim, or use a hot tub until your health care provider approves. °· Check your insertion site every day for redness, swelling, or drainage. °· Do not apply powder or lotion to the site. °· Do not lift over 10 lb (4.5 kg) for 5 days after your procedure or as directed by your health care provider. °· Ask your health care provider when it is okay to: °¨ Return to work or school. °¨ Resume usual physical activities or sports. °¨ Resume sexual activity. °· Do not drive home if you are discharged the same day as the procedure. Have someone else drive you. °· You may drive 24 hours after the procedure unless otherwise instructed by your health care provider. °· Do not operate machinery or power tools for 24 hours after the procedure or as directed by your health care provider. °· If your procedure was done as an outpatient procedure, which means that you went home the same day as your procedure, a responsible adult should be with you for the first 24 hours after you arrive home. °· Keep all follow-up visits as directed by your health care provider. This is important. °SEEK MEDICAL CARE IF: °· You have a fever. °· You have chills. °· You have increased bleeding from the catheter insertion site. Hold pressure on the site. °SEEK IMMEDIATE MEDICAL CARE IF: °· You have unusual pain at the catheter insertion site. °· You have redness, warmth, or swelling at the catheter insertion site. °· You have drainage (other than a small amount of blood on  the dressing) from the catheter insertion site. °· The catheter insertion site is bleeding, and the bleeding does not stop after 30 minutes of holding steady pressure on the site. °· The area near or just beyond the catheter insertion site becomes pale, cool, tingly, or numb. °  °This information is not intended to replace advice given to you by your health care provider. Make sure you discuss any questions you have with your health care provider. °  °Document Released: 06/18/2005 Document Revised: 12/21/2014 Document Reviewed: 05/03/2013 °Elsevier Interactive Patient Education ©2016 [employer] ° ° °

## 2016-05-15 NOTE — Telephone Encounter (Signed)
Pt's wife is needing a work note where she went w/ him to his Cath this morning, pt's wife states they would not give her one today at [facility] and they did not give the pt anything to give his employer concerning work restrictions

## 2016-05-15 NOTE — Telephone Encounter (Signed)
I have work slip for Mrs Lapiana

## 2016-05-15 NOTE — H&P (View-Only) (Signed)
Patient ID: Antonio Cobb, male   DOB: 1961-03-27, 55 y.o.   MRN: [id number]     Clinical Summary Antonio Cobb is a 55 y.o.male seen today for follow up of the following medical problems.   1. CAD - hx of inferolateral STEMI June 2013, received DES to LCX. LVEF at that time 55%.  - he denies any recent chest pain. Has had some recent SOB and LE edema.  - compliant with meds  2. Hyperlipidemia - compliant with statin  3. HTN - does not check at home regularly - compliant with meds  4. COPD - followed by pcp  5. SOB - signifiant SOB at last pcp as well as LE edema and orthopnea. Seen in ER 04/27/16, found to evidence of fluid overload. Managed with IV lasix and discharged home. BNP 357. CXR with pulm edema. Started on oral lasix 40mg  daily at ER discahrge.  - SOB started about 3 weeks. +cough nonproductive. Mild wheezing. Has noted some LE edema 3 days ago. Recently started on prednisone a few days ago. No chest pain. +orthopnea that is new.   Past Medical History  Diagnosis Date  . Alcohol use (Antonio Cobb)   . Obesity   . Nicotine addiction   . Hyperlipidemia   . Diabetes mellitus, type 2 (Antonio Cobb)   . Hypertension   . Other and unspecified alcohol dependence, unspecified drinking behavior   . Elevated cholesterol   . Myocardial infarction (Antonio Cobb) 05/23/12     No Known Allergies   Current Outpatient Prescriptions  Medication Sig Dispense Refill  . albuterol (PROVENTIL HFA;VENTOLIN HFA) 108 (90 Base) MCG/ACT inhaler Two puffs before exercise , as needed, for  wheezing 18 g 2  . amLODipine (NORVASC) 10 MG tablet take 1 tablet once daily 30 tablet 4  . aspirin (ASPIRIN ADULT LOW STRENGTH) 81 MG EC tablet Take 81 mg by mouth daily.      [name] atorvastatin (LIPITOR) 80 MG tablet Take 1 tablet (80 mg total) by mouth daily. 90 tablet 3  . doxycycline (VIBRAMYCIN) 100 MG capsule Take 1 capsule (100 mg total) by mouth 2 (two) times daily. 20 capsule 0  . furosemide (LASIX) 40 MG tablet Take  1 tablet (40 mg total) by mouth daily. 30 tablet 0  . glucose blood test strip Four times daily testing dx [name] 150 each 1  . guaiFENesin-codeine (ROBITUSSIN AC) 100-10 MG/5ML syrup Take 10 mLs by mouth 3 (three) times daily as needed. 120 mL 0  . hydrochlorothiazide (HYDRODIURIL) 25 MG tablet take 1 tablet by mouth once daily 30 tablet 4  . insulin aspart (NOVOLOG) 100 UNIT/ML injection Inject 15 Units into the skin 3 (three) times daily before meals. Sliding scale begins at 15 units and increases as CBG dictates (Patient taking differently: Inject into the skin 3 (three) times daily before meals. Sliding scale per home instructions) 10 mL 0  . Insulin Glargine (LANTUS SOLOSTAR) 100 UNIT/ML Solostar Pen Inject 40 Units into the skin See admin instructions. Everynight    . ipratropium-albuterol (DUONEB) 0.5-2.5 (3) MG/3ML SOLN Take 3 mLs by nebulization every 6 (six) hours as needed. 360 mL 0  . irbesartan (AVAPRO) 300 MG tablet take 1 tablet by mouth once daily 30 tablet 4  . Lancets (FREESTYLE) lancets Four times daily testing dx e11.65 150 each 1  . metoprolol succinate (TOPROL-XL) 50 MG 24 hr tablet Take 1 tablet (50 mg total) by mouth daily. Take with or immediately following a meal. 90 tablet 3  .  montelukast (SINGULAIR) 10 MG tablet Take 1 tablet (10 mg total) by mouth at bedtime. 30 tablet 5  . nitroGLYCERIN (NITROSTAT) 0.4 MG SL tablet Place 1 tablet (0.4 mg total) under the tongue every 5 (five) minutes as needed for chest pain. 25 tablet 3  . predniSONE (DELTASONE) 20 MG tablet Take 2 tablets (40 mg total) by mouth daily. For 5 days 10 tablet 0  . SPIRIVA HANDIHALER 18 MCG inhalation capsule inhale the contents of one capsule in the handihaler once daily 30 capsule 0  . spironolactone (ALDACTONE) 25 MG tablet take 1 tablet by mouth once daily 30 tablet 4   No current facility-administered medications for this visit.     Past Surgical History  Procedure Laterality Date  . None    .  Cardiac catheterization  2 yrs ago  . Polypectomy  10/16/2011    Procedure: POLYPECTOMY;  Surgeon: [name], MD;  Location: [facility];  Service: Endoscopy;;  Polypoid Lesion, Transverse and Sigmoid Colon  . Coronary stent placement  05/23/12  . Left heart catheterization with coronary angiogram N/A 05/23/2012    Procedure: LEFT HEART CATHETERIZATION WITH CORONARY ANGIOGRAM;  Surgeon: [name], MD;  Location: [facility] CATH LAB;  Service: Cardiovascular;  Laterality: N/A;  . Percutaneous coronary stent intervention (pci-s) N/A 05/23/2012    Procedure: PERCUTANEOUS CORONARY STENT INTERVENTION (PCI-S);  Surgeon: [name], MD;  Location: [facility] CATH LAB;  Service: Cardiovascular;  Laterality: N/A;     No Known Allergies    Family History  Problem Relation Age of Onset  . Emphysema Mother   . Heart attack Mother   . Hypertension Mother   . Diabetes Mother   . Heart disease Mother   . COPD Mother   . Emphysema Father   . COPD Father   . Stroke Sister   . Asthma Sister   . Colon cancer Neg Hx   . Anesthesia problems Neg Hx   . Hypotension Neg Hx   . Malignant hyperthermia Neg Hx   . Pseudochol deficiency Neg Hx      Social History [name] reports that he has been smoking Cigars.  He has never used smokeless tobacco. [name] reports that he drinks alcohol.   Review of Systems CONSTITUTIONAL: No weight loss, fever, chills, weakness or fatigue.  HEENT: Eyes: No visual loss, blurred vision, double vision or yellow sclerae.No hearing loss, sneezing, congestion, runny nose or sore throat.  SKIN: No rash or itching.  CARDIOVASCULAR: per HPI RESPIRATORY: per HPI.  GASTROINTESTINAL: No anorexia, nausea, vomiting or diarrhea. No abdominal pain or blood.  GENITOURINARY: No burning on urination, no polyuria NEUROLOGICAL: No headache, dizziness, syncope, paralysis, ataxia, numbness or tingling in the extremities. No change in bowel or bladder control.  MUSCULOSKELETAL: No  muscle, back pain, joint pain or stiffness.  LYMPHATICS: No enlarged nodes. No history of splenectomy.  PSYCHIATRIC: No history of depression or anxiety.  ENDOCRINOLOGIC: No reports of sweating, cold or heat intolerance. No polyuria or polydipsia.  [name]   Physical Examination Filed Vitals:   04/28/16 1448  BP: 110/76  Pulse: 97   Filed Vitals:   04/28/16 1448  Height: 5\' 10"  (1.778 m)  Weight: 202 lb (91.627 kg)    Gen: resting comfortably, no acute distress HEENT: no scleral icterus, pupils equal round and reactive, no palptable cervical adenopathy,  CV: RRR, no mr/g, no jvd Resp: Clear to auscultation bilaterally GI: abdomen is soft, non-tender, non-distended, normal bowel sounds, no hepatosplenomegaly MSK: extremities are  warm, trace bilateral edema.  Skin: warm, no rash Neuro:  no focal deficits Psych: appropriate affect   Diagnostic Studies Cath 05/2012 [facility] FINDINGS  Hemodynamics:  AO 119/78  LV 121/71  Coronary angiography:  Coronary dominance: right  Left mainstem: The left main is patent with diffuse nonobstructive plaque. The distal left main has 30% stenosis.  Left anterior descending (LAD): the LAD is patent to the LV apex. The mid LAD has 50-60% stenosis at the origin of the second diagonal Montrice Montuori. The first diagonal is very large in caliber and has no significant obstructive disease.  Left circumflex (LCx): the left circumflex is totally occluded in the mid vessel. There is TIMI 0 flow. Following PCI, there were 2 obtuse marginals and a left posterolateral Mykal Kirchman visualized, all of which are patent.  Right coronary artery (RCA): there is a high anterior origin of the RCA. The mid vessel has diffuse plaque estimated at 50-60%. The vessel is dominant. There is diffuse nonobstructive disease throughout. There is a small PDA and small posterolateral Florrie Ramires.  Left ventriculography: Left ventricular systolic function is in the low-normal range, LVEF is  estimated at 55%, there is hypokinesis of the basal and midinferior wall.  PCI Note: Following the diagnostic procedure, the decision was made to proceed with PCI. The patient was loaded with Effient 60 mg. Weight-based bivalirudin was given for anticoagulation. Once a therapeutic ACT was achieved, a 6 [place] XB-LAD guide catheter was inserted. A Cougar coronary guidewire was used to cross the lesion. The lesion was predilated with a 2.5x15 mm balloon. The lesion was then stented with a 3.5x15 mm Resolute drug-eluting stent. Following PCI, there was 0% residual stenosis and TIMI-3 flow. Final angiography confirmed an excellent result. The patient tolerated the procedure well. There were no immediate procedural complications. A TR band was used for radial hemostasis. The patient was transferred to the post catheterization recovery area for further monitoring.  PCI Data:  Vessel - circumflex /Segment - mid  Percent Stenosis (pre) 100  TIMI-flow 0  Stent 3.5 x 15 mm resolute integrity DES  Percent Stenosis (post) 0  TIMI-flow (post) 3  Final Conclusions:  1. Total occlusion of the left circumflex with successful primary PCI using a drug-eluting stent platform  2. Diffuse nonobstructive disease of the right coronary artery and LAD  3. Mild left ventricular dysfunction consistent with inferior MI  Recommendations:  transfer to ICU. Post MI medical therapy will be instituted. Tobacco cessation counseling will be done. Anticipate 48 hour hospitalization if his post MI course is uncomplicated.    Assessment and Plan  1. CAD - no current symptoms - we will continue current meds  2. Hyperlipidemia - continue current staitn  3. HTN - at goal, continue current meds  4. SOB - some clinical evidence of volume overload. We will obtain echo to look for change in cardiac function. He will continue lasix 40mg  daily for 3 more days, then change to prn only. Recently started prednisone for  upper respiraotry symptoms, could also have played some role in fluid retention. - check BMET and Mg in 1 week on diuretic  F/u 2 weeks.       Arnoldo Lenis, M.D

## 2016-05-15 NOTE — Interval H&P Note (Signed)
Cath Lab Visit (complete for each Cath Lab visit)  Clinical Evaluation Leading to the Procedure:   ACS: No.  Non-ACS:    Anginal Classification: CCS IV  Anti-ischemic medical therapy: Minimal Therapy (1 class of medications)  Non-Invasive Test Results: High-risk stress test findings: cardiac mortality >3%/year  Prior CABG: No previous CABG   New onset heart failure   History and Physical Interval Note:  05/15/2016 8:09 AM  Edwena Bunde  has presented today for surgery, with the diagnosis of Decreased EF, cad  The various methods of treatment have been discussed with the patient and family. After consideration of risks, benefits and other options for treatment, the patient has consented to  Procedure(s): Right/Left Heart Cath and Coronary Angiography (N/A) as a surgical intervention .  The patient's history has been reviewed, patient examined, no change in status, stable for surgery.  I have reviewed the patient's chart and labs.  Questions were answered to the patient's satisfaction.     Larae Grooms

## 2016-05-15 NOTE — Progress Notes (Addendum)
Inpatient Diabetes Program Recommendations  AACE/ADA: New Consensus Statement on Inpatient Glycemic Control (2015)  Target Ranges:  Prepandial:   less than 140 mg/dL      Peak postprandial:   less than 180 mg/dL (1-2 hours)      Critically ill patients:  140 - 180 mg/dL   Lab Results  Component Value Date   GLUCAP 268* 05/15/2016   HGBA1C 11.5* 03/12/2016    Review of Glycemic Control  Diabetes history: DM 2 Outpatient Diabetes medications: Lantus 60, Novolog 11-20 units TID with meals Current orders for Inpatient glycemic control: None  Note: Last A1c in March 2017 very uncontrolled at 11.5%  Inpatient Diabetes Program Recommendations:  Insulin - Basal: If patient remains inpatient after cath may want to order a portion of home basal insulin, Lantus 40 units Q24hrs. Correction (SSI): Lab glucose 281 mg/dl this am. Consider ordering Novolog Moderate Correction Q4 hours when NPO for cath. Then change to ACHS.  Thanks,  Tama Headings RN, MSN, [facility] Inpatient Diabetes Coordinator Team Pager [phone] (8a-5p)

## 2016-05-15 NOTE — Telephone Encounter (Signed)
I will forward to Ms Purcell Nails NP

## 2016-05-19 NOTE — Telephone Encounter (Signed)
Wife picked up excuse slip

## 2016-05-29 ENCOUNTER — Encounter: Payer: BLUE CROSS/BLUE SHIELD | Admitting: Adult Health

## 2016-05-29 ENCOUNTER — Encounter: Payer: Self-pay | Admitting: Adult Health

## 2016-05-29 ENCOUNTER — Ambulatory Visit (INDEPENDENT_AMBULATORY_CARE_PROVIDER_SITE_OTHER): Payer: BLUE CROSS/BLUE SHIELD | Admitting: Adult Health

## 2016-05-29 VITALS — BP 112/74 | HR 101 | Ht 70.0 in | Wt 203.0 lb

## 2016-05-29 DIAGNOSIS — I429 Cardiomyopathy, unspecified: Secondary | ICD-10-CM

## 2016-05-29 DIAGNOSIS — I10 Essential (primary) hypertension: Secondary | ICD-10-CM

## 2016-05-29 DIAGNOSIS — F1010 Alcohol abuse, uncomplicated: Secondary | ICD-10-CM

## 2016-05-29 MED ORDER — HYDROCHLOROTHIAZIDE 25 MG PO TABS: ORAL_TABLET | ORAL | Status: DC

## 2016-05-29 MED ORDER — NITROGLYCERIN 0.4 MG SL SUBL: 0.4000 mg | SUBLINGUAL_TABLET | SUBLINGUAL | Status: DC | PRN

## 2016-05-29 MED ORDER — SPIRONOLACTONE 25 MG PO TABS: 25.0000 mg | ORAL_TABLET | Freq: Every day | ORAL | Status: DC

## 2016-05-29 NOTE — Progress Notes (Deleted)
Name: Antonio Cobb    DOB: [birth year]  Age: 55 y.o.  MR#: [id number]       PCP:  Tula Nakayama, MD      Insurance: Payor: [place] / Plan: [place] / Product Type: *No Product type* /   CC:   No chief complaint on file.   VS Filed Vitals:   05/29/16 1454  BP: 112/74  Pulse: 101  Height: 5\' 10"  (1.778 m)  Weight: 203 lb (92.08 kg)  SpO2: 94%    Weights Current Weight  05/29/16 203 lb (92.08 kg)  05/15/16 209 lb (94.802 kg)  05/12/16 205 lb (92.987 kg)    Blood Pressure  BP Readings from Last 3 Encounters:  05/29/16 112/74  05/15/16 107/85  05/12/16 140/90     Admit date:  (Not on file) Last encounter with RMR:  05/15/2016   Allergy Review of patient's allergies indicates no known allergies.  Current Outpatient Prescriptions  Medication Sig Dispense Refill  . albuterol (PROVENTIL HFA;VENTOLIN HFA) 108 (90 Base) MCG/ACT inhaler Two puffs before exercise , as needed, for  wheezing (Patient taking differently: Inhale 2 puffs into the lungs as needed for wheezing. Two puffs before exercise , as needed, for  wheezing) 18 g 2  . amLODipine (NORVASC) 10 MG tablet take 1 tablet once daily 30 tablet 4  . aspirin (ASPIRIN ADULT LOW STRENGTH) 81 MG EC tablet Take 81 mg by mouth daily.      [name] atorvastatin (LIPITOR) 40 MG tablet Take 40 mg by mouth daily.    . furosemide (LASIX) 40 MG tablet Take 1 tablet (40 mg total) by mouth daily. 90 tablet 3  . glucose blood test strip Four times daily testing dx [name] 150 each 1  . hydrochlorothiazide (HYDRODIURIL) 25 MG tablet take 1 tablet by mouth once daily 90 tablet 3  . insulin aspart (NOVOLOG) 100 UNIT/ML injection Inject 15 Units into the skin 3 (three) times daily before meals. Sliding scale begins at 15 units and increases as CBG dictates (Patient taking differently: Inject 11-20 Units into the skin 3 (three) times daily before meals. Sliding scale per home instructions) 10 mL 0  . Insulin Glargine (LANTUS SOLOSTAR)  100 UNIT/ML Solostar Pen Inject 60 Units into the skin daily at 10 pm. Everynight    . ipratropium-albuterol (DUONEB) 0.5-2.5 (3) MG/3ML SOLN Take 3 mLs by nebulization every 6 (six) hours as needed. (Patient taking differently: Take 3 mLs by nebulization every 6 (six) hours as needed (for wheezing or shortness of breath). ) 360 mL 0  . irbesartan (AVAPRO) 300 MG tablet take 1 tablet by mouth once daily 30 tablet 4  . Lancets (FREESTYLE) lancets Four times daily testing dx e11.65 150 each 1  . metoprolol succinate (TOPROL-XL) 50 MG 24 hr tablet Take 1 tablet (50 mg total) by mouth daily. Take with or immediately following a meal. 90 tablet 3  . montelukast (SINGULAIR) 10 MG tablet Take 1 tablet (10 mg total) by mouth at bedtime. 30 tablet 5  . nitroGLYCERIN (NITROSTAT) 0.4 MG SL tablet Place 1 tablet (0.4 mg total) under the tongue every 5 (five) minutes as needed for chest pain. 25 tablet 3  . potassium chloride SA (KLOR-CON M20) 20 MEQ tablet Take 1 tablet (20 mEq total) by mouth daily. 90 tablet 3  . SPIRIVA HANDIHALER 18 MCG inhalation capsule inhale the contents of one capsule in the handihaler once daily 30 capsule 0  . spironolactone (ALDACTONE) 25 MG tablet  Take 1 tablet (25 mg total) by mouth daily. 90 tablet 3   No current facility-administered medications for this visit.    Discontinued Meds:    Medications Discontinued During This Encounter  Medication Reason  . atorvastatin (LIPITOR) 80 MG tablet Error  . nitroGLYCERIN (NITROSTAT) 0.4 MG SL tablet Reorder  . spironolactone (ALDACTONE) 25 MG tablet Reorder  . hydrochlorothiazide (HYDRODIURIL) 25 MG tablet Reorder    Patient Active Problem List   Diagnosis Date Noted  . Abnormal echocardiogram   . Acute systolic heart failure ([place])   . Wheezing 04/28/2016  . COPD with exacerbation ([place]) 04/28/2016  . Acute asthma flare 10/09/2015  . Seasonal allergies 05/06/2015  . Cough 05/10/2013  . NICM (nonischemic cardiomyopathy)  ([place]) 05/23/2012  . Tobacco abuse 05/23/2012  . Alcohol abuse 05/23/2012  . Arteriosclerotic cardiovascular disease (ASCVD) 05/23/2012  . ED (erectile dysfunction) 05/10/2012  . Overweight 08/12/2010  . Chronic bronchitis ([place]) 05/09/2009  . Diabetes mellitus, insulin dependent (IDDM), uncontrolled ([place]) 03/08/2008  . Hyperlipidemia LDL goal <70 03/08/2008  . Essential hypertension 03/08/2008    LABS    Component Value Date/Time   NA 133* 05/15/2016 0612   NA 139 05/12/2016 1610   NA 136 04/27/2016 0943   K 3.5 05/15/2016 0612   K 3.1* 05/12/2016 1610   K 3.6 04/27/2016 0943   CL 97* 05/15/2016 0612   CL 104 05/12/2016 1610   CL 101 04/27/2016 0943   CO2 28 05/15/2016 0612   CO2 27 05/12/2016 1610   CO2 27 04/27/2016 0943   GLUCOSE 281* 05/15/2016 0612   GLUCOSE 96 05/12/2016 1610   GLUCOSE 237* 04/27/2016 0943   BUN 24* 05/15/2016 0612   BUN 18 05/12/2016 1610   BUN 15 04/27/2016 0943   CREATININE 1.31* 05/15/2016 0612   CREATININE 0.81 05/12/2016 1610   CREATININE 0.94 04/27/2016 0943   CREATININE 1.07 03/12/2016 1111   CREATININE 1.05 10/09/2015 1648   CREATININE 1.12 07/05/2014 1643   CALCIUM 9.1 05/15/2016 0612   CALCIUM 9.2 05/12/2016 1610   CALCIUM 9.2 04/27/2016 0943   GFRNONAA 60* 05/15/2016 0612   GFRNONAA >60 05/12/2016 1610   GFRNONAA >60 04/27/2016 0943   GFRNONAA 78 03/12/2016 1111   GFRNONAA 80 10/09/2015 1648   GFRNONAA 74 07/05/2014 1643   GFRAA >60 05/15/2016 0612   GFRAA >60 05/12/2016 1610   GFRAA >60 04/27/2016 0943   GFRAA >89 03/12/2016 1111   GFRAA >89 10/09/2015 1648   GFRAA 86 07/05/2014 1643   CMP     Component Value Date/Time   NA 133* 05/15/2016 0612   K 3.5 05/15/2016 0612   CL 97* 05/15/2016 0612   CO2 28 05/15/2016 0612   GLUCOSE 281* 05/15/2016 0612   BUN 24* 05/15/2016 0612   CREATININE 1.31* 05/15/2016 0612   CREATININE 1.07 03/12/2016 1111   CALCIUM 9.1 05/15/2016 0612   PROT 6.8 03/12/2016 1111   ALBUMIN 4.0  03/12/2016 1111   AST 21 03/12/2016 1111   ALT 26 03/12/2016 1111   ALKPHOS 82 03/12/2016 1111   BILITOT 1.2 03/12/2016 1111   GFRNONAA 60* 05/15/2016 0612   GFRNONAA 78 03/12/2016 1111   GFRAA >60 05/15/2016 0612   GFRAA >89 03/12/2016 1111       Component Value Date/Time   WBC 9.2 05/12/2016 1610   WBC 9.0 04/27/2016 0943   WBC 8.2 04/20/2016 1206   HGB 14.0 05/12/2016 1610   HGB 14.3 04/27/2016 0943   HGB 15.1 04/20/2016 1206  HCT 41.5 05/12/2016 1610   HCT 44.1 04/27/2016 0943   HCT 44.8 04/20/2016 1206   MCV 85.9 05/12/2016 1610   MCV 88.2 04/27/2016 0943   MCV 87.5 04/20/2016 1206    Lipid Panel     Component Value Date/Time   CHOL 165 03/12/2016 1111   TRIG 309* 03/12/2016 1111   HDL 33* 03/12/2016 1111   CHOLHDL 5.0 03/12/2016 1111   VLDL 62* 03/12/2016 1111   LDLCALC 70 03/12/2016 1111   LDLDIRECT TEST NOT PERFORMED 06/19/2011 1230    ABG    Component Value Date/Time   PHART 7.417 05/15/2016 0834   PCO2ART 40.9 05/15/2016 0834   PO2ART 73.0* 05/15/2016 0834   HCO3 27.9* 05/15/2016 0837   TCO2 29 05/15/2016 0837   O2SAT 66.0 05/15/2016 0837     Lab Results  Component Value Date   TSH 1.38 03/12/2016   BNP (last 3 results)  Recent Labs  04/27/16 0945  BNP 357.0*    ProBNP (last 3 results) No results for input(s): PROBNP in the last 8760 hours.  Cardiac Panel (last 3 results) No results for input(s): CKTOTAL, CKMB, TROPONINI, RELINDX in the last 72 hours.  Iron/TIBC/Ferritin/ %Sat No results found for: IRON, TIBC, FERRITIN, IRONPCTSAT   EKG Orders placed or performed during the hospital encounter of 04/27/16  . ED EKG  . ED EKG  . EKG 12-Lead  . EKG 12-Lead  . EKG     Prior Assessment and Plan Problem List as of 05/29/2016      Cardiovascular and Mediastinum   Essential hypertension   Last Assessment & Plan 04/27/2016 Office Visit Written 04/28/2016  8:04 AM by [name], MD    Controlled, no change in medication  DASH  diet and commitment to daily physical activity for a minimum of 30 minutes discussed and encouraged, as a part of hypertension management. The importance of attaining a healthy weight is also discussed.  BP/Weight 04/27/2016 04/27/2016 04/20/2016 03/12/2016 10/09/2015 [id number] [id number]  Systolic BP A999333 [id number] 123456 123456 [id number]  Diastolic BP A999333 82 90 84 80 82 110  Wt. (Lbs) 209 209 203 203 209 205.12 194.8  BMI 30.85 29.99 29.13 29.13 29.99 29.43 27.95            NICM (nonischemic cardiomyopathy) [facility])   Last Assessment & Plan 04/27/2016 Office Visit Written 04/28/2016  8:00 AM by [name], MD    Signs and symptoms of heart failyure. Needs ED evaluation for full evaluation, pt reports near syncope this morning and is in cardiopulmonary distress. Discussed with ED Provider prior to sending to ED with his wife accompanying him      Arteriosclerotic cardiovascular disease (ASCVD)   Last Assessment & Plan 03/12/2016 Office Visit Written 03/15/2016  8:24 PM by [name], MD    reports recent chest discomfort , needs Cardiology re eval      Acute systolic heart failure [facility])     Respiratory   Chronic bronchitis [facility])   Last Assessment & Plan 05/06/2015 Office Visit Written 06/09/2015  7:44 PM by [name], MD    Uncontrolled likley has COPD due to long h/o nicotine which persists      Seasonal allergies   Last Assessment & Plan 05/06/2015 Office Visit Written 06/09/2015  7:45 PM by [name], MD    Uncontrolled , start daily meds and aggressive short course of steroids also daily saline flushes of nares      Acute asthma  flare   Last Assessment & Plan 10/09/2015 Office Visit Written 10/14/2015  5:14 AM by [name], MD    Neb treatment in office and IM depo medrol followed by prednisone dose pack. Re educated re need for daily spirometry to help with asthma evaluation from home so less chance of acute decompensation, also need to use daily  prophylactic medication      COPD with exacerbation [facility])   Last Assessment & Plan 04/27/2016 Office Visit Written 04/28/2016  7:58 AM by [name], MD    Acute flare with progressive deterioration in respiratory status. Neb treatment in office and sent to ED for further eval and management Neb solution for home use also sent in        Endocrine   Diabetes mellitus, insulin dependent (IDDM), uncontrolled [facility])   Last Assessment & Plan 04/27/2016 Office Visit Written 04/28/2016  8:01 AM by [name], MD    Non compliant. Uncontrolled, Needs endo management, does not go consistently, in denial of severity iof his illness Mr. Nigrelli is reminded of the importance of commitment to daily physical activity for 30 minutes or more, as able and the need to limit carbohydrate intake to 30 to 60 grams per meal to help with blood sugar control.   The need to take medication as prescribed, test blood sugar as directed, and to call between visits if there is a concern that blood sugar is uncontrolled is also discussed.   Mr. Viverito is reminded of the importance of daily foot exam, annual eye examination, and good blood sugar, blood pressure and cholesterol control.  Diabetic Labs Latest Ref Rng 04/27/2016 04/20/2016 03/12/2016 10/09/2015 05/06/2015  HbA1c <5.7 % - - 11.5(H) 11.2(H) -  Microalbumin <2.0 mg/dL - - - - 0.8  Micro/Creat Ratio 0.0 - 30.0 mg/g - - - - 6.4  Chol 125 - 200 mg/dL - - 165 - -  HDL >=40 mg/dL - - 33(L) - -  Calc LDL <130 mg/dL - - 70 - -  Triglycerides <150 mg/dL - - 309(H) - -  Creatinine 0.61 - 1.24 mg/dL 0.94 0.96 1.07 1.05 -   BP/Weight 04/27/2016 04/27/2016 04/20/2016 03/12/2016 10/09/2015 [id number] [id number]  Systolic BP A999333 [id number] 123456 123456 [id number] 0000000  Diastolic BP A999333 82 90 84 80 82 110  Wt. (Lbs) 209 209 203 203 209 205.12 194.8  BMI 30.85 29.99 29.13 29.13 29.99 29.43 27.95   Foot/eye exam completion dates Latest Ref Rng 10/10/2015 05/06/2015  Eye Exam No  Retinopathy No Retinopathy -  Foot Form Completion - - Done               Genitourinary   ED (erectile dysfunction)   Last Assessment & Plan 05/10/2012 Office Visit Written 05/15/2012  9:59 AM by [name], MD    Deterioration in performance, requests medication trial, wife present and nbi agreement        Other   Hyperlipidemia LDL goal <70   Last Assessment & Plan 03/12/2016 Office Visit Written 03/15/2016  8:30 PM by [name], MD    Hyperlipidemia:Low fat diet discussed and encouraged.   Lipid Panel  Lab Results  Component Value Date   CHOL 165 03/12/2016   HDL 33* 03/12/2016   LDLCALC 70 03/12/2016   LDLDIRECT TEST NOT PERFORMED 06/19/2011   TRIG 309* 03/12/2016   CHOLHDL 5.0 03/12/2016   Elevated tG, dietary modification needed, no med change  Overweight   Last Assessment & Plan 10/09/2015 Office Visit Written 10/14/2015  5:19 AM by [name], MD    Deteriorated. Patient re-educated about  the importance of commitment to a  minimum of 150 minutes of exercise per week.  The importance of healthy food choices with portion control discussed. Encouraged to start a food diary, count calories and to consider  joining a support group. Sample diet sheets offered. Goals set by the patient for the next several months.   Weight /BMI 10/09/2015 05/06/2015 03/23/2015  WEIGHT 209 lb 205 lb 1.9 oz 194 lb 12.8 oz  HEIGHT 5\' 10"  5\' 10"  5\' 10"   BMI 29.99 kg/m2 29.43 kg/m2 27.95 kg/m2          Tobacco abuse   Last Assessment & Plan 04/27/2016 Office Visit Edited 04/28/2016  8:04 AM by [name], MD    Patient counseled s regarding the health risks of ongoing nicotine use, specifically all types of cancer, heart disease, stroke and respiratory failure. The options available for help with cessation ,the behavioral changes to assist the process, and the option to either gradully reduce usage  Or abruptly stop.is also discussed. Pt is also  encouraged to set specific goals in number of cigarettes used daily, as well as to set a quit date.         Alcohol abuse   Last Assessment & Plan 03/12/2016 Office Visit Written 03/15/2016  8:26 PM by [name], MD    Ongoing alcohol dependence , negatively affecting diabetes and heart health, re educated pt re need to quit to improve health and prolong life, also contact info for out pt rehab provided      Cough   Last Assessment & Plan 05/10/2013 Office Visit Written 05/14/2013  9:32 PM by [name], MD    H/o exercise induce cough, proventil prescribed for use before exercise      Wheezing   Last Assessment & Plan 04/27/2016 Office Visit Written 04/28/2016  7:59 AM by [name], MD    Bilateral wheezes with reduced air entry and low oxygen Neb treatment in office      Abnormal echocardiogram       Imaging: No results found.

## 2016-05-29 NOTE — Progress Notes (Signed)
Cardiology Office Note   Date:  05/29/2016   ID:  Antonio Cobb, DOB Jan 19, 1961, MRN [id number]  PCP:  Antonio Nakayama, MD  Cardiologist: Antonio Spring, NP   No chief complaint on file.     History of Present Illness: Antonio Cobb is a 55 y.o. male who presents for ongoing assessment and management of non-ischemic cardiomyopathy, EF of 30-35%, CAD with stent to the mid CX, hypertension, hyperlipidemia, with COPD. He had worsening dyspnea and was sent for cardiac cath.. He had cardiac catheterization on 05/15/2016 which revealed:    Patent stent in the mid circumflex.  Moderate disease in the RCA and LAD.  Mild pulmonary hypertension.  Cardiac output 5.1 L/m. Cardiac index 2.4  Pulmonary artery saturation 66%.  Cardiomyopathy not related to obstructive coronary artery disease. The patient did state that he drinks alcohol heavily. I did explain him the cutting back on his alcohol may help his cardiac function   He is hear today without complaints. He continues to struggle with salt. He has cut his drinking down to two days a week and continues to minimize the amount of ETOH he is drinking. He is medically compliant.   Past Medical History  Diagnosis Date  . Alcohol use ([place])   . Obesity   . Nicotine addiction   . Hyperlipidemia   . Diabetes mellitus, type 2 ([place])   . Hypertension   . Other and unspecified alcohol dependence, unspecified drinking behavior   . Elevated cholesterol   . Myocardial infarction [facility]) 05/23/12    Past Surgical History  Procedure Laterality Date  . None    . Cardiac catheterization  2 yrs ago  . Polypectomy  10/16/2011    Procedure: POLYPECTOMY;  Surgeon: [name], MD;  Location: [facility] ORS;  Service: Endoscopy;;  Polypoid Lesion, Transverse and Sigmoid Colon  . Coronary stent placement  05/23/12  . Left heart catheterization with coronary angiogram N/A 05/23/2012    Procedure: LEFT HEART CATHETERIZATION WITH CORONARY  ANGIOGRAM;  Surgeon: [name], MD;  Location: [facility] CATH LAB;  Service: Cardiovascular;  Laterality: N/A;  . Percutaneous coronary stent intervention (pci-s) N/A 05/23/2012    Procedure: PERCUTANEOUS CORONARY STENT INTERVENTION (PCI-S);  Surgeon: [name], MD;  Location: [facility] CATH LAB;  Service: Cardiovascular;  Laterality: N/A;  . Cardiac catheterization N/A 05/15/2016    Procedure: Right/Left Heart Cath and Coronary Angiography;  Surgeon: [name], MD;  Location: [facility] CV LAB;  Service: Cardiovascular;  Laterality: N/A;     Current Outpatient Prescriptions  Medication Sig Dispense Refill  . albuterol (PROVENTIL HFA;VENTOLIN HFA) 108 (90 Base) MCG/ACT inhaler Two puffs before exercise , as needed, for  wheezing (Patient taking differently: Inhale 2 puffs into the lungs as needed for wheezing. Two puffs before exercise , as needed, for  wheezing) 18 g 2  . amLODipine (NORVASC) 10 MG tablet take 1 tablet once daily 30 tablet 4  . aspirin (ASPIRIN ADULT LOW STRENGTH) 81 MG EC tablet Take 81 mg by mouth daily.      [name] atorvastatin (LIPITOR) 40 MG tablet Take 40 mg by mouth daily.    . furosemide (LASIX) 40 MG tablet Take 1 tablet (40 mg total) by mouth daily. 90 tablet 3  . glucose blood test strip Four times daily testing dx [name] 150 each 1  . hydrochlorothiazide (HYDRODIURIL) 25 MG tablet take 1 tablet by mouth once daily 90 tablet 3  . insulin aspart (NOVOLOG) 100 UNIT/ML injection Inject  15 Units into the skin 3 (three) times daily before meals. Sliding scale begins at 15 units and increases as CBG dictates (Patient taking differently: Inject 11-20 Units into the skin 3 (three) times daily before meals. Sliding scale per home instructions) 10 mL 0  . Insulin Glargine (LANTUS SOLOSTAR) 100 UNIT/ML Solostar Pen Inject 60 Units into the skin daily at 10 pm. Everynight    . ipratropium-albuterol (DUONEB) 0.5-2.5 (3) MG/3ML SOLN Take 3 mLs by nebulization every 6 (six) hours as  needed. (Patient taking differently: Take 3 mLs by nebulization every 6 (six) hours as needed (for wheezing or shortness of breath). ) 360 mL 0  . irbesartan (AVAPRO) 300 MG tablet take 1 tablet by mouth once daily 30 tablet 4  . Lancets (FREESTYLE) lancets Four times daily testing dx e11.65 150 each 1  . metoprolol succinate (TOPROL-XL) 50 MG 24 hr tablet Take 1 tablet (50 mg total) by mouth daily. Take with or immediately following a meal. 90 tablet 3  . montelukast (SINGULAIR) 10 MG tablet Take 1 tablet (10 mg total) by mouth at bedtime. 30 tablet 5  . nitroGLYCERIN (NITROSTAT) 0.4 MG SL tablet Place 1 tablet (0.4 mg total) under the tongue every 5 (five) minutes as needed for chest pain. 25 tablet 3  . potassium chloride SA (KLOR-CON M20) 20 MEQ tablet Take 1 tablet (20 mEq total) by mouth daily. 90 tablet 3  . SPIRIVA HANDIHALER 18 MCG inhalation capsule inhale the contents of one capsule in the handihaler once daily 30 capsule 0  . spironolactone (ALDACTONE) 25 MG tablet Take 1 tablet (25 mg total) by mouth daily. 90 tablet 3   No current facility-administered medications for this visit.    Allergies:   Review of patient's allergies indicates no known allergies.    Social History:  The patient  reports that he quit smoking 6 days ago. His smoking use included Cigars. He has never used smokeless tobacco. He reports that he drinks alcohol. He reports that he does not use illicit drugs.   Family History:  The patient's family history includes Asthma in his sister; COPD in his father and mother; Diabetes in his mother; Emphysema in his father and mother; Heart attack in his mother; Heart disease in his mother; Hypertension in his mother; Stroke in his sister. There is no history of Colon cancer, Anesthesia problems, Hypotension, Malignant hyperthermia, or Pseudochol deficiency.    ROS: All other systems are reviewed and negative. Unless otherwise mentioned in H&P    PHYSICAL EXAM: VS:  BP  112/74 mmHg  Pulse 101  Ht 5\' 10"  (1.778 m)  Wt 203 lb (92.08 kg)  BMI 29.13 kg/m2  SpO2 94% , BMI Body mass index is 29.13 kg/(m^2). GEN: Well nourished, well developed, in no acute distress HEENT: normal Neck: no JVD, carotid bruits, or masses Cardiac: RRR; distant heart sounds,  no murmurs, rubs, or gallops,no edema  Respiratory:  clear to auscultation bilaterally, normal work of breathing GI: soft, nontender, nondistended, + BS MS: no deformity or atrophy Skin: warm and dry, no rash Neuro:  Strength and sensation are intact Psych: euthymic mood, full affect  Recent Labs: 03/12/2016: ALT 26; TSH 1.38 04/27/2016: B Natriuretic Peptide 357.0* 05/12/2016: Hemoglobin 14.0; Platelets 273 05/15/2016: BUN 24*; Creatinine, Ser 1.31*; Potassium 3.5; Sodium 133*    Lipid Panel    Component Value Date/Time   CHOL 165 03/12/2016 1111   TRIG 309* 03/12/2016 1111   HDL 33* 03/12/2016 1111   CHOLHDL  5.0 03/12/2016 1111   VLDL 62* 03/12/2016 1111   LDLCALC 70 03/12/2016 1111   LDLDIRECT TEST NOT PERFORMED 06/19/2011 1230      Wt Readings from Last 3 Encounters:  05/29/16 203 lb (92.08 kg)  05/15/16 209 lb (94.802 kg)  05/12/16 205 lb (92.987 kg)    ASSESSMENT AND PLAN:  1.  NICM: Cardiac cath above does not reveal any CAD. He is to continue current medical regimen He is on metoprolol Will consider changing to coreg on next office visit. Continue ARB. Mayu be a candidate for Entresto.  2. Hypertension: BP is controlled. I have reinforced decreased salt intake. Given him writtien instructions. He verbalizes understanding.   3. ETOH Abuse: He states he has cut down his drinking to two days a week. I have explained that this is dangerous to his heart along with medications. I have offered him information on AA. He refused at this time.    Current medicines are reviewed at length with the patient today.    Labs/ tests ordered today include: None No orders of the defined types were  placed in this encounter.     Disposition:   FU with  3 months   Signed, Jory Sims, NP  05/29/2016 3:03 PM    [facility] [address] [place], [place] [address] Phone: [phone]; Fax: [phone]

## 2016-05-29 NOTE — Patient Instructions (Signed)
Your physician recommends that you schedule a follow-up appointment in: 3 Months with Kathryn Lawrence, NP   Your physician recommends that you continue on your current medications as directed. Please refer to the Current Medication list given to you today.  If you need a refill on your cardiac medications before your next appointment, please call your pharmacy.  Thank you for choosing [facility]!    

## 2016-06-17 ENCOUNTER — Encounter: Payer: Self-pay | Admitting: Family Medicine

## 2016-06-17 ENCOUNTER — Encounter (INDEPENDENT_AMBULATORY_CARE_PROVIDER_SITE_OTHER): Payer: Self-pay

## 2016-06-17 ENCOUNTER — Ambulatory Visit (INDEPENDENT_AMBULATORY_CARE_PROVIDER_SITE_OTHER): Payer: BLUE CROSS/BLUE SHIELD | Admitting: Family Medicine

## 2016-06-17 VITALS — BP 106/76 | HR 92 | Resp 18 | Ht 70.0 in | Wt 209.0 lb

## 2016-06-17 DIAGNOSIS — E785 Hyperlipidemia, unspecified: Secondary | ICD-10-CM

## 2016-06-17 DIAGNOSIS — E1049 Type 1 diabetes mellitus with other diabetic neurological complication: Secondary | ICD-10-CM | POA: Diagnosis not present

## 2016-06-17 DIAGNOSIS — E1065 Type 1 diabetes mellitus with hyperglycemia: Secondary | ICD-10-CM

## 2016-06-17 DIAGNOSIS — F1010 Alcohol abuse, uncomplicated: Secondary | ICD-10-CM

## 2016-06-17 DIAGNOSIS — Z720 Tobacco use: Secondary | ICD-10-CM

## 2016-06-17 DIAGNOSIS — I10 Essential (primary) hypertension: Secondary | ICD-10-CM

## 2016-06-17 DIAGNOSIS — IMO0002 Reserved for concepts with insufficient information to code with codable children: Secondary | ICD-10-CM

## 2016-06-17 DIAGNOSIS — G4700 Insomnia, unspecified: Secondary | ICD-10-CM

## 2016-06-17 MED ORDER — SPIRONOLACTONE 25 MG PO TABS: 25.0000 mg | ORAL_TABLET | Freq: Every day | ORAL | Status: DC

## 2016-06-17 MED ORDER — TEMAZEPAM 7.5 MG PO CAPS: 7.5000 mg | ORAL_CAPSULE | Freq: Every evening | ORAL | Status: DC | PRN

## 2016-06-17 MED ORDER — FUROSEMIDE 40 MG PO TABS: 40.0000 mg | ORAL_TABLET | Freq: Every day | ORAL | Status: DC

## 2016-06-17 MED ORDER — ATORVASTATIN CALCIUM 40 MG PO TABS: 40.0000 mg | ORAL_TABLET | Freq: Every day | ORAL | Status: DC

## 2016-06-17 NOTE — Assessment & Plan Note (Signed)
Sleep hygiene reviewed and written information offered also. Prescription sent for  medication needed.  

## 2016-06-17 NOTE — Patient Instructions (Addendum)
CPE in 3.5 month, call if you need me before  HBA1C , chem 7 and EGFR today  NO more alcohol , this is damaging your heart  Congrats on stopping smoking  Pls sched and keep appt with Dr Dorris Fetch  Thank you  for choosing [facility]. We consider it a privelige to serve you.  Delivering excellent health care in a caring and  compassionate way is our goal.  Partnering with you,  so that together we can achieve this goal is our strategy.

## 2016-06-17 NOTE — Progress Notes (Signed)
Antonio Cobb     MRN: [id number]      DOB: [birth year]   HPI Antonio Cobb is here for follow up and re-evaluation of chronic medical conditions, medication management and review of any available recent lab and radiology data.  Preventive health is updated, specifically  Cancer screening and Immunization.   Questions or concerns regarding consultations or procedures which the PT has had in the interim are  Addressed.States the cardiology consult scared him into understanding how important it is for him to stop smoking and drinking, still in denial re alcohol, quit nicotine  X 10 dayThere are no specific complaints  Not testing blood sugars, c/o fatigue needs to see endo, c/o thirst and excess urination C/o poor sleep , negative depression screen  ROS Denies recent fever or chills. Denies sinus pressure, nasal congestion, ear pain or sore throat. Denies chest congestion, productive cough or wheezing. Denies chest pains, palpitations and leg swelling Denies abdominal pain, nausea, vomiting,diarrhea or constipation.   Denies dysuria, frequency, hesitancy or incontinence. Denies joint pain, swelling and limitation in mobility. Denies headaches, seizures, numbness, or tingling. Denies depression, anxiety  Denies skin break down or rash.   PE  BP 106/76 mmHg  Pulse 92  Resp 18  Ht 5\' 10"  (1.778 m)  Wt 209 lb (94.802 kg)  BMI 29.99 kg/m2  SpO2 99%  Patient alert and oriented and in no cardiopulmonary distress.  HEENT: No facial asymmetry, EOMI,   oropharynx pink and moist.  Neck supple no JVD, no mass.  Chest: Clear to auscultation bilaterally.  CVS: S1, S2 no murmurs, no S3.Regular rate.  ABD: Soft non tender.   Ext: No edema  MS: Adequate ROM spine, shoulders, hips and knees.  Skin: Intact, no ulcerations or rash noted.  Psych: Good eye contact, normal affect. Memory intact not anxious or depressed appearing.  CNS: CN 2-12 intact, power,  normal throughout.no focal  deficits noted.   Assessment & Plan  Essential hypertension Controlled, no change in medication DASH diet and commitment to daily physical activity for a minimum of 30 minutes discussed and encouraged, as a part of hypertension management. The importance of attaining a healthy weight is also discussed.  BP/Weight 06/17/2016 05/29/2016 05/15/2016 05/12/2016 04/28/2016 04/27/2016 A999333  Systolic BP A999333 [id number] XX123456 XX123456 A999333 A999333 [id number]  Diastolic BP 76 74 85 90 76 102 82  Wt. (Lbs) 209 203 209 205 202 209 209  BMI 29.99 29.13 29.99 29.41 28.98 30.85 29.99        Insomnia Sleep hygiene reviewed and written information offered also. Prescription sent for  medication needed.   Alcohol abuse Ongoing dependence , refusing treatment states he can do this on his own, spouse present and encouraged to help him as best able  Hyperlipidemia LDL goal <70 Hyperlipidemia:Low fat diet discussed and encouraged. Needs to reduce fat intake and commit to regular exercise   Lipid Panel  Lab Results  Component Value Date   CHOL 165 03/12/2016   HDL 33* 03/12/2016   LDLCALC 70 03/12/2016   LDLDIRECT TEST NOT PERFORMED 06/19/2011   TRIG 309* 03/12/2016   CHOLHDL 5.0 03/12/2016        Diabetes mellitus, insulin dependent (IDDM), uncontrolled Uncontrolled, needs to re establish with endo Updated lab needed  Antonio Cobb is reminded of the importance of commitment to daily physical activity for 30 minutes or more, as able and the need to limit carbohydrate intake to 30 to 60 grams per meal to help with  blood sugar control.   The need to take medication as prescribed, test blood sugar as directed, and to call between visits if there is a concern that blood sugar is uncontrolled is also discussed.   Antonio Cobb is reminded of the importance of daily foot exam, annual eye examination, and good blood sugar, blood pressure and cholesterol control.  Diabetic Labs Latest Ref Rng 05/15/2016 05/12/2016 04/27/2016  04/20/2016 03/12/2016  HbA1c <5.7 % - - - - 11.5(H)  Microalbumin <2.0 mg/dL - - - - -  Micro/Creat Ratio 0.0 - 30.0 mg/g - - - - -  Chol 125 - 200 mg/dL - - - - 165  HDL >=40 mg/dL - - - - 33(L)  Calc LDL <130 mg/dL - - - - 70  Triglycerides <150 mg/dL - - - - 309(H)  Creatinine 0.61 - 1.24 mg/dL 1.31(H) 0.81 0.94 0.96 1.07   BP/Weight 06/17/2016 05/29/2016 05/15/2016 05/12/2016 04/28/2016 04/27/2016 A999333  Systolic BP A999333 [id number] XX123456 XX123456 A999333 A999333 Q000111Q  Diastolic BP 76 74 85 90 76 102 82  Wt. (Lbs) 209 203 209 205 202 209 209  BMI 29.99 29.13 29.99 29.41 28.98 30.85 29.99   Foot/eye exam completion dates Latest Ref Rng 10/10/2015 05/06/2015  Eye Exam No Retinopathy No Retinopathy -  Foot Form Completion - - Done

## 2016-06-17 NOTE — Assessment & Plan Note (Signed)
Uncontrolled, needs to re establish with endo Updated lab needed  Mr. Berney is reminded of the importance of commitment to daily physical activity for 30 minutes or more, as able and the need to limit carbohydrate intake to 30 to 60 grams per meal to help with blood sugar control.   The need to take medication as prescribed, test blood sugar as directed, and to call between visits if there is a concern that blood sugar is uncontrolled is also discussed.   Mr. Barsamian is reminded of the importance of daily foot exam, annual eye examination, and good blood sugar, blood pressure and cholesterol control.  Diabetic Labs Latest Ref Rng 05/15/2016 05/12/2016 04/27/2016 04/20/2016 03/12/2016  HbA1c <5.7 % - - - - 11.5(H)  Microalbumin <2.0 mg/dL - - - - -  Micro/Creat Ratio 0.0 - 30.0 mg/g - - - - -  Chol 125 - 200 mg/dL - - - - 165  HDL >=40 mg/dL - - - - 33(L)  Calc LDL <130 mg/dL - - - - 70  Triglycerides <150 mg/dL - - - - 309(H)  Creatinine 0.61 - 1.24 mg/dL 1.31(H) 0.81 0.94 0.96 1.07   BP/Weight 06/17/2016 05/29/2016 05/15/2016 05/12/2016 04/28/2016 04/27/2016 A999333  Systolic BP A999333 XX123456 XX123456 XX123456 A999333 A999333 Q000111Q  Diastolic BP 76 74 85 90 76 102 82  Wt. (Lbs) 209 203 209 205 202 209 209  BMI 29.99 29.13 29.99 29.41 28.98 30.85 29.99   Foot/eye exam completion dates Latest Ref Rng 10/10/2015 05/06/2015  Eye Exam No Retinopathy No Retinopathy -  Foot Form Completion - - Done

## 2016-06-17 NOTE — Assessment & Plan Note (Signed)
Controlled, no change in medication DASH diet and commitment to daily physical activity for a minimum of 30 minutes discussed and encouraged, as a part of hypertension management. The importance of attaining a healthy weight is also discussed.  BP/Weight 06/17/2016 05/29/2016 05/15/2016 05/12/2016 04/28/2016 04/27/2016 A999333  Systolic BP A999333 XX123456 XX123456 XX123456 A999333 A999333 Q000111Q  Diastolic BP 76 74 85 90 76 102 82  Wt. (Lbs) 209 203 209 205 202 209 209  BMI 29.99 29.13 29.99 29.41 28.98 30.85 29.99

## 2016-06-17 NOTE — Assessment & Plan Note (Signed)
Hyperlipidemia:Low fat diet discussed and encouraged. Needs to reduce fat intake and commit to regular exercise   Lipid Panel  Lab Results  Component Value Date   CHOL 165 03/12/2016   HDL 33* 03/12/2016   LDLCALC 70 03/12/2016   LDLDIRECT TEST NOT PERFORMED 06/19/2011   TRIG 309* 03/12/2016   CHOLHDL 5.0 03/12/2016

## 2016-06-17 NOTE — Assessment & Plan Note (Signed)
Ongoing dependence , refusing treatment states he can do this on his own, spouse present and encouraged to help him as best able

## 2016-06-18 LAB — COMPLETE METABOLIC PANEL WITH GFR
ALK PHOS: 81 U/L (ref 40–115)
ALT: 19 U/L (ref 9–46)
AST: 19 U/L (ref 10–35)
Albumin: 4.2 g/dL (ref 3.6–5.1)
BUN: 22 mg/dL (ref 7–25)
CALCIUM: 9.9 mg/dL (ref 8.6–10.3)
CHLORIDE: 102 mmol/L (ref 98–110)
CO2: 28 mmol/L (ref 20–31)
Creat: 1.16 mg/dL (ref 0.70–1.33)
GFR, EST NON AFRICAN AMERICAN: 70 mL/min (ref >=60)
GFR, Est African American: 81 mL/min (ref >=60)
Glucose, Bld: 193 mg/dL — ABNORMAL HIGH (ref 65–99)
POTASSIUM: 3.9 mmol/L (ref 3.5–5.3)
SODIUM: 139 mmol/L (ref 135–146)
Total Bilirubin: 1 mg/dL (ref 0.2–1.2)
Total Protein: 7.4 g/dL (ref 6.1–8.1)

## 2016-06-18 LAB — HEMOGLOBIN A1C
HEMOGLOBIN A1C: 10.6 % — AB (ref <5.7)
Mean Plasma Glucose: 258 mg/dL

## 2016-06-29 ENCOUNTER — Other Ambulatory Visit: Payer: Self-pay

## 2016-06-29 MED ORDER — METOPROLOL SUCCINATE ER 50 MG PO TB24: 50.0000 mg | ORAL_TABLET | Freq: Every day | ORAL | Status: DC

## 2016-08-31 ENCOUNTER — Encounter: Payer: Self-pay | Admitting: Adult Health

## 2016-08-31 ENCOUNTER — Ambulatory Visit: Payer: BLUE CROSS/BLUE SHIELD | Admitting: Adult Health

## 2016-08-31 NOTE — Progress Notes (Deleted)
Cardiology Office Note   Date:  08/31/2016   ID:  RYANCHRISTOPHER GROPP, DOB 04-07-1961, MRN [id number]  PCP:  Tula Nakayama, MD  Cardiologist:Branch/   Jory Sims, NP   No chief complaint on file.     History of Present Illness: Antonio Cobb is a 55 y.o. male who presents for ongoing assessment and management of nonischemic cardiomyopathy, EF of 30-35%, coronary artery disease with stent to the mid circumflex, hypertension, hyperlipidemia, with known history of COPD.  This recent catheterization on 05/15/2016 revealed  Patent stent in the mid circumflex.  Moderate disease in the RCA and LAD.  Mild pulmonary hypertension.  Cardiac output 5.1 L/m. Cardiac index 2.4  Pulmonary artery saturation 66%.   On last office visit on 05/29/2016 he was stable from a cardiac standpoint. He was going to be considered for [facility] on this visit. He unfortunately continued to drink, and was offered information on AA, which he refused.   Past Medical History:  Diagnosis Date  . Alcohol use ([place])   . Diabetes mellitus, type 2 ([place])   . Elevated cholesterol   . Hyperlipidemia   . Hypertension   . Myocardial infarction ([place]) 05/23/12  . Nicotine addiction   . Obesity   . Other and unspecified alcohol dependence, unspecified drinking behavior     Past Surgical History:  Procedure Laterality Date  . CARDIAC CATHETERIZATION  2 yrs ago  . CARDIAC CATHETERIZATION N/A 05/15/2016   Procedure: Right/Left Heart Cath and Coronary Angiography;  Surgeon: [name], MD;  Location: [facility];  Service: Cardiovascular;  Laterality: N/A;  . CORONARY STENT PLACEMENT  05/23/12  . LEFT HEART CATHETERIZATION WITH CORONARY ANGIOGRAM N/A 05/23/2012   Procedure: LEFT HEART CATHETERIZATION WITH CORONARY ANGIOGRAM;  Surgeon: [name], MD;  Location: [facility] CATH LAB;  Service: Cardiovascular;  Laterality: N/A;  . None    . PERCUTANEOUS CORONARY STENT INTERVENTION (PCI-S) N/A  05/23/2012   Procedure: PERCUTANEOUS CORONARY STENT INTERVENTION (PCI-S);  Surgeon: [name], MD;  Location: [facility] CATH LAB;  Service: Cardiovascular;  Laterality: N/A;  . POLYPECTOMY  10/16/2011   Procedure: POLYPECTOMY;  Surgeon: [name], MD;  Location: [facility];  Service: Endoscopy;;  Polypoid Lesion, Transverse and Sigmoid Colon     Current Outpatient Prescriptions  Medication Sig Dispense Refill  . albuterol (PROVENTIL HFA;VENTOLIN HFA) 108 (90 Base) MCG/ACT inhaler Two puffs before exercise , as needed, for  wheezing (Patient taking differently: Inhale 2 puffs into the lungs as needed for wheezing. Two puffs before exercise , as needed, for  wheezing) 18 g 2  . amLODipine (NORVASC) 10 MG tablet take 1 tablet once daily 30 tablet 4  . aspirin (ASPIRIN ADULT LOW STRENGTH) 81 MG EC tablet Take 81 mg by mouth daily.      [name] atorvastatin (LIPITOR) 40 MG tablet Take 1 tablet (40 mg total) by mouth daily. 90 tablet 1  . furosemide (LASIX) 40 MG tablet Take 1 tablet (40 mg total) by mouth daily. 90 tablet 1  . glucose blood test strip Four times daily testing dx [name] 150 each 1  . hydrochlorothiazide (HYDRODIURIL) 25 MG tablet take 1 tablet by mouth once daily 90 tablet 3  . insulin aspart (NOVOLOG) 100 UNIT/ML injection Inject 15 Units into the skin 3 (three) times daily before meals. Sliding scale begins at 15 units and increases as CBG dictates (Patient taking differently: Inject 11-20 Units into the skin 3 (three) times daily before meals. Sliding scale per home  instructions) 10 mL 0  . Insulin Glargine (LANTUS SOLOSTAR) 100 UNIT/ML Solostar Pen Inject 60 Units into the skin daily at 10 pm. Everynight    . ipratropium-albuterol (DUONEB) 0.5-2.5 (3) MG/3ML SOLN Take 3 mLs by nebulization every 6 (six) hours as needed. (Patient taking differently: Take 3 mLs by nebulization every 6 (six) hours as needed (for wheezing or shortness of breath). ) 360 mL 0  . irbesartan (AVAPRO) 300 MG tablet  take 1 tablet by mouth once daily 30 tablet 4  . Lancets (FREESTYLE) lancets Four times daily testing dx e11.65 150 each 1  . metoprolol succinate (TOPROL-XL) 50 MG 24 hr tablet Take 1 tablet (50 mg total) by mouth daily. Take with or immediately following a meal. 90 tablet 0  . montelukast (SINGULAIR) 10 MG tablet Take 1 tablet (10 mg total) by mouth at bedtime. 30 tablet 5  . nitroGLYCERIN (NITROSTAT) 0.4 MG SL tablet Place 1 tablet (0.4 mg total) under the tongue every 5 (five) minutes as needed for chest pain. 25 tablet 3  . potassium chloride SA (KLOR-CON M20) 20 MEQ tablet Take 1 tablet (20 mEq total) by mouth daily. 90 tablet 3  . SPIRIVA HANDIHALER 18 MCG inhalation capsule inhale the contents of one capsule in the handihaler once daily 30 capsule 0  . spironolactone (ALDACTONE) 25 MG tablet Take 1 tablet (25 mg total) by mouth daily. 90 tablet 1  . temazepam (RESTORIL) 7.5 MG capsule Take 1 capsule (7.5 mg total) by mouth at bedtime as needed for sleep. 30 capsule 3   No current facility-administered medications for this visit.     Allergies:   Review of patient's allergies indicates no known allergies.    Social History:  The patient  reports that he quit smoking about 3 months ago. His smoking use included Cigars. He has a 30.00 pack-year smoking history. He has never used smokeless tobacco. He reports that he drinks alcohol. He reports that he does not use drugs.   Family History:  The patient's family history includes Asthma in his sister; COPD in his father and mother; Diabetes in his mother; Emphysema in his father and mother; Heart attack in his mother; Heart disease in his mother; Hypertension in his mother; Stroke in his sister.    ROS: All other systems are reviewed and negative. Unless otherwise mentioned in H&P    PHYSICAL EXAM: VS:  There were no vitals taken for this visit. , BMI There is no height or weight on file to calculate BMI. GEN: Well nourished, well  developed, in no acute distress  HEENT: normal  Neck: no JVD, carotid bruits, or masses Cardiac: ***RRR; no murmurs, rubs, or gallops,no edema  Respiratory:  clear to auscultation bilaterally, normal work of breathing GI: soft, nontender, nondistended, + BS MS: no deformity or atrophy  Skin: warm and dry, no rash Neuro:  Strength and sensation are intact Psych: euthymic mood, full affect   EKG:  EKG {ACTION; IS/IS [id number] ordered today. The ekg ordered today demonstrates ***   Recent Labs: 03/12/2016: TSH 1.38 04/27/2016: B Natriuretic Peptide 357.0 05/12/2016: Hemoglobin 14.0; Platelets 273 06/17/2016: ALT 19; BUN 22; Creat 1.16; Potassium 3.9; Sodium 139    Lipid Panel    Component Value Date/Time   CHOL 165 03/12/2016 1111   TRIG 309 (H) 03/12/2016 1111   HDL 33 (L) 03/12/2016 1111   CHOLHDL 5.0 03/12/2016 1111   VLDL 62 (H) 03/12/2016 1111   LDLCALC 70 03/12/2016 1111   LDLDIRECT  TEST NOT PERFORMED 06/19/2011 1230      Wt Readings from Last 3 Encounters:  06/17/16 209 lb (94.8 kg)  05/29/16 203 lb (92.1 kg)  05/15/16 209 lb (94.8 kg)      Other studies Reviewed: Additional studies/ records that were reviewed today include: ***. Review of the above records demonstrates: ***   ASSESSMENT AND PLAN:  1.  ***   Current medicines are reviewed at length with the patient today.    Labs/ tests ordered today include: *** No orders of the defined types were placed in this encounter.    Disposition:   FU with *** in {gen number [id number] {TIME; UNITS DAY/WEEK/MONTH:19136}   Signed, Jory Sims, NP  08/31/2016 7:34 AM    [facility] [address], [place], [place] [address] Phone: [phone]; Fax: [phone]

## 2016-09-28 ENCOUNTER — Encounter: Payer: Self-pay | Admitting: Adult Health

## 2016-09-28 ENCOUNTER — Ambulatory Visit (INDEPENDENT_AMBULATORY_CARE_PROVIDER_SITE_OTHER): Payer: BLUE CROSS/BLUE SHIELD | Admitting: Adult Health

## 2016-09-28 VITALS — BP 122/72 | HR 95 | Ht 70.0 in | Wt 206.0 lb

## 2016-09-28 DIAGNOSIS — I4821 Permanent atrial fibrillation: Secondary | ICD-10-CM

## 2016-09-28 DIAGNOSIS — I429 Cardiomyopathy, unspecified: Secondary | ICD-10-CM

## 2016-09-28 DIAGNOSIS — Z79899 Other long term (current) drug therapy: Secondary | ICD-10-CM | POA: Diagnosis not present

## 2016-09-28 DIAGNOSIS — I5022 Chronic systolic (congestive) heart failure: Secondary | ICD-10-CM | POA: Diagnosis not present

## 2016-09-28 DIAGNOSIS — I482 Chronic atrial fibrillation: Secondary | ICD-10-CM | POA: Diagnosis not present

## 2016-09-28 NOTE — Patient Instructions (Signed)
Medication Instructions:  Your physician recommends that you continue on your current medications as directed. Please refer to the Current Medication list given to you today.   Labwork: Your physician recommends that you return for lab work in: TODAY BMET   Testing/Procedures: Your physician has requested that you have an echocardiogram. Echocardiography is a painless test that uses sound waves to create images of your heart. It provides your doctor with information about the size and shape of your heart and how well your heart's chambers and valves are working. This procedure takes approximately one hour. There are no restrictions for this procedure.    Follow-Up: Your physician recommends that you schedule a follow-up appointment in: 3 MONTHS '   Any Other Special Instructions Will Be Listed Below (If Applicable).     If you need a refill on your cardiac medications before your next appointment, please call your pharmacy.

## 2016-09-28 NOTE — Progress Notes (Signed)
Name: Antonio Cobb    DOB: May 03, 1961  Age: 55 y.o.  MR#: [id number]       PCP:  Tula Nakayama, MD      Insurance: Payor: [place] / Plan: [place] / Product Type: *No Product type* /   CC:   No chief complaint on file.   VS Vitals:   09/28/16 1521  Weight: 206 lb (93.4 kg)  Height: 5\' 10"  (1.778 m)    Weights Current Weight  09/28/16 206 lb (93.4 kg)  06/17/16 209 lb (94.8 kg)  05/29/16 203 lb (92.1 kg)    Blood Pressure  BP Readings from Last 3 Encounters:  06/17/16 106/76  05/29/16 112/74  05/15/16 107/85     Admit date:  (Not on file) Last encounter with RMR:  08/31/2016   Allergy Review of patient's allergies indicates no known allergies.  Current Outpatient Prescriptions  Medication Sig Dispense Refill  . albuterol (PROVENTIL HFA;VENTOLIN HFA) 108 (90 Base) MCG/ACT inhaler Two puffs before exercise , as needed, for  wheezing (Patient taking differently: Inhale 2 puffs into the lungs as needed for wheezing. Two puffs before exercise , as needed, for  wheezing) 18 g 2  . amLODipine (NORVASC) 10 MG tablet take 1 tablet once daily 30 tablet 4  . aspirin (ASPIRIN ADULT LOW STRENGTH) 81 MG EC tablet Take 81 mg by mouth daily.      [name] atorvastatin (LIPITOR) 40 MG tablet Take 1 tablet (40 mg total) by mouth daily. 90 tablet 1  . furosemide (LASIX) 40 MG tablet Take 1 tablet (40 mg total) by mouth daily. 90 tablet 1  . glucose blood test strip Four times daily testing dx [name] 150 each 1  . hydrochlorothiazide (HYDRODIURIL) 25 MG tablet take 1 tablet by mouth once daily 90 tablet 3  . insulin aspart (NOVOLOG) 100 UNIT/ML injection Inject 15 Units into the skin 3 (three) times daily before meals. Sliding scale begins at 15 units and increases as CBG dictates (Patient taking differently: Inject 11-20 Units into the skin 3 (three) times daily before meals. Sliding scale per home instructions) 10 mL 0  . Insulin Glargine (LANTUS SOLOSTAR) 100 UNIT/ML Solostar  Pen Inject 60 Units into the skin daily at 10 pm. Everynight    . ipratropium-albuterol (DUONEB) 0.5-2.5 (3) MG/3ML SOLN Take 3 mLs by nebulization every 6 (six) hours as needed. (Patient taking differently: Take 3 mLs by nebulization every 6 (six) hours as needed (for wheezing or shortness of breath). ) 360 mL 0  . irbesartan (AVAPRO) 300 MG tablet take 1 tablet by mouth once daily 30 tablet 4  . Lancets (FREESTYLE) lancets Four times daily testing dx e11.65 150 each 1  . metoprolol succinate (TOPROL-XL) 50 MG 24 hr tablet Take 1 tablet (50 mg total) by mouth daily. Take with or immediately following a meal. 90 tablet 0  . montelukast (SINGULAIR) 10 MG tablet Take 1 tablet (10 mg total) by mouth at bedtime. 30 tablet 5  . nitroGLYCERIN (NITROSTAT) 0.4 MG SL tablet Place 1 tablet (0.4 mg total) under the tongue every 5 (five) minutes as needed for chest pain. 25 tablet 3  . potassium chloride SA (KLOR-CON M20) 20 MEQ tablet Take 1 tablet (20 mEq total) by mouth daily. 90 tablet 3  . SPIRIVA HANDIHALER 18 MCG inhalation capsule inhale the contents of one capsule in the handihaler once daily 30 capsule 0  . spironolactone (ALDACTONE) 25 MG tablet Take 1 tablet (25 mg total)  by mouth daily. 90 tablet 1  . temazepam (RESTORIL) 7.5 MG capsule Take 1 capsule (7.5 mg total) by mouth at bedtime as needed for sleep. 30 capsule 3   No current facility-administered medications for this visit.     Discontinued Meds:   There are no discontinued medications.  Patient Active Problem List   Diagnosis Date Noted  . Insomnia 06/17/2016  . Abnormal echocardiogram   . Wheezing 04/28/2016  . Acute asthma flare 10/09/2015  . Seasonal allergies 05/06/2015  . Cough 05/10/2013  . NICM (nonischemic cardiomyopathy) ([place]) 05/23/2012  . Tobacco abuse 05/23/2012  . Alcohol abuse 05/23/2012  . Arteriosclerotic cardiovascular disease (ASCVD) 05/23/2012  . ED (erectile dysfunction) 05/10/2012  . Overweight 08/12/2010   . Diabetes mellitus, insulin dependent (IDDM), uncontrolled ([place]) 03/08/2008  . Hyperlipidemia LDL goal <70 03/08/2008  . Essential hypertension 03/08/2008    LABS    Component Value Date/Time   NA 139 06/17/2016 1638   NA 133 (L) 05/15/2016 0612   NA 139 05/12/2016 1610   K 3.9 06/17/2016 1638   K 3.5 05/15/2016 0612   K 3.1 (L) 05/12/2016 1610   CL 102 06/17/2016 1638   CL 97 (L) 05/15/2016 0612   CL 104 05/12/2016 1610   CO2 28 06/17/2016 1638   CO2 28 05/15/2016 0612   CO2 27 05/12/2016 1610   GLUCOSE 193 (H) 06/17/2016 1638   GLUCOSE 281 (H) 05/15/2016 0612   GLUCOSE 96 05/12/2016 1610   BUN 22 06/17/2016 1638   BUN 24 (H) 05/15/2016 0612   BUN 18 05/12/2016 1610   CREATININE 1.16 06/17/2016 1638   CREATININE 1.31 (H) 05/15/2016 0612   CREATININE 0.81 05/12/2016 1610   CREATININE 0.94 04/27/2016 0943   CREATININE 1.07 03/12/2016 1111   CREATININE 1.05 10/09/2015 1648   CALCIUM 9.9 06/17/2016 1638   CALCIUM 9.1 05/15/2016 0612   CALCIUM 9.2 05/12/2016 1610   GFRNONAA 70 06/17/2016 1638   GFRNONAA 60 (L) 05/15/2016 0612   GFRNONAA >60 05/12/2016 1610   GFRNONAA >60 04/27/2016 0943   GFRNONAA 78 03/12/2016 1111   GFRNONAA 80 10/09/2015 1648   GFRAA 81 06/17/2016 1638   GFRAA >60 05/15/2016 0612   GFRAA >60 05/12/2016 1610   GFRAA >60 04/27/2016 0943   GFRAA >89 03/12/2016 1111   GFRAA >89 10/09/2015 1648   CMP     Component Value Date/Time   NA 139 06/17/2016 1638   K 3.9 06/17/2016 1638   CL 102 06/17/2016 1638   CO2 28 06/17/2016 1638   GLUCOSE 193 (H) 06/17/2016 1638   BUN 22 06/17/2016 1638   CREATININE 1.16 06/17/2016 1638   CALCIUM 9.9 06/17/2016 1638   PROT 7.4 06/17/2016 1638   ALBUMIN 4.2 06/17/2016 1638   AST 19 06/17/2016 1638   ALT 19 06/17/2016 1638   ALKPHOS 81 06/17/2016 1638   BILITOT 1.0 06/17/2016 1638   GFRNONAA 70 06/17/2016 1638   GFRAA 81 06/17/2016 1638       Component Value Date/Time   WBC 9.2 05/12/2016 1610   WBC  9.0 04/27/2016 0943   WBC 8.2 04/20/2016 1206   HGB 14.0 05/12/2016 1610   HGB 14.3 04/27/2016 0943   HGB 15.1 04/20/2016 1206   HCT 41.5 05/12/2016 1610   HCT 44.1 04/27/2016 0943   HCT 44.8 04/20/2016 1206   MCV 85.9 05/12/2016 1610   MCV 88.2 04/27/2016 0943   MCV 87.5 04/20/2016 1206    Lipid Panel     Component Value Date/Time  CHOL 165 03/12/2016 1111   TRIG 309 (H) 03/12/2016 1111   HDL 33 (L) 03/12/2016 1111   CHOLHDL 5.0 03/12/2016 1111   VLDL 62 (H) 03/12/2016 1111   LDLCALC 70 03/12/2016 1111   LDLDIRECT TEST NOT PERFORMED 06/19/2011 1230    ABG    Component Value Date/Time   PHART 7.417 05/15/2016 0834   PCO2ART 40.9 05/15/2016 0834   PO2ART 73.0 (L) 05/15/2016 0834   HCO3 27.9 (H) 05/15/2016 0837   TCO2 29 05/15/2016 0837   O2SAT 66.0 05/15/2016 0837     Lab Results  Component Value Date   TSH 1.38 03/12/2016   BNP (last 3 results)  Recent Labs  04/27/16 0945  BNP 357.0*    ProBNP (last 3 results) No results for input(s): PROBNP in the last 8760 hours.  Cardiac Panel (last 3 results) No results for input(s): CKTOTAL, CKMB, TROPONINI, RELINDX in the last 72 hours.  Iron/TIBC/Ferritin/ %Sat No results found for: IRON, TIBC, FERRITIN, IRONPCTSAT   EKG Orders placed or performed during the hospital encounter of 04/27/16  . ED EKG  . ED EKG  . EKG 12-Lead  . EKG 12-Lead  . EKG     Prior Assessment and Plan Problem List as of 09/28/2016 Reviewed: 06/17/2016 10:33 PM by Tula Nakayama, MD     Cardiovascular and Mediastinum   Essential hypertension   Last Assessment & Plan 06/17/2016 Office Visit Written 06/17/2016 10:37 PM by [name], MD    Controlled, no change in medication DASH diet and commitment to daily physical activity for a minimum of 30 minutes discussed and encouraged, as a part of hypertension management. The importance of attaining a healthy weight is also discussed.  BP/Weight 06/17/2016 05/29/2016 05/15/2016 05/12/2016  04/28/2016 04/27/2016 A999333  Systolic BP A999333 [id number] [id number] XX123456 A999333 A999333 [id number]  Diastolic BP 76 74 85 90 76 102 82  Wt. (Lbs) 209 203 209 205 202 209 209  BMI 29.99 29.13 29.99 29.41 28.98 30.85 29.99            NICM (nonischemic cardiomyopathy) [facility])   Last Assessment & Plan 04/27/2016 Office Visit Written 04/28/2016  8:00 AM by [name], MD    Signs and symptoms of heart failyure. Needs ED evaluation for full evaluation, pt reports near syncope this morning and is in cardiopulmonary distress. Discussed with ED Provider prior to sending to ED with his wife accompanying him      Arteriosclerotic cardiovascular disease (ASCVD)   Last Assessment & Plan 03/12/2016 Office Visit Written 03/15/2016  8:24 PM by [name], MD    reports recent chest discomfort , needs Cardiology re eval        Respiratory   Seasonal allergies   Last Assessment & Plan 05/06/2015 Office Visit Written 06/09/2015  7:45 PM by [name], MD    Uncontrolled , start daily meds and aggressive short course of steroids also daily saline flushes of nares      Acute asthma flare   Last Assessment & Plan 10/09/2015 Office Visit Written 10/14/2015  5:14 AM by [name], MD    Neb treatment in office and IM depo medrol followed by prednisone dose pack. Re educated re need for daily spirometry to help with asthma evaluation from home so less chance of acute decompensation, also need to use daily prophylactic medication        Endocrine   Diabetes mellitus, insulin dependent (IDDM), uncontrolled ([place])   Last Assessment & Plan  06/17/2016 Office Visit Written 06/17/2016 10:40 PM by [name], MD    Uncontrolled, needs to re establish with endo Updated lab needed  Mr. Kawczynski is reminded of the importance of commitment to daily physical activity for 30 minutes or more, as able and the need to limit carbohydrate intake to 30 to 60 grams per meal to help with blood sugar control.   The need to  take medication as prescribed, test blood sugar as directed, and to call between visits if there is a concern that blood sugar is uncontrolled is also discussed.   Mr. Bossio is reminded of the importance of daily foot exam, annual eye examination, and good blood sugar, blood pressure and cholesterol control.  Diabetic Labs Latest Ref Rng 05/15/2016 05/12/2016 04/27/2016 04/20/2016 03/12/2016  HbA1c <5.7 % - - - - 11.5(H)  Microalbumin <2.0 mg/dL - - - - -  Micro/Creat Ratio 0.0 - 30.0 mg/g - - - - -  Chol 125 - 200 mg/dL - - - - 165  HDL >=40 mg/dL - - - - 33(L)  Calc LDL <130 mg/dL - - - - 70  Triglycerides <150 mg/dL - - - - 309(H)  Creatinine 0.61 - 1.24 mg/dL 1.31(H) 0.81 0.94 0.96 1.07   BP/Weight 06/17/2016 05/29/2016 05/15/2016 05/12/2016 04/28/2016 04/27/2016 A999333  Systolic BP A999333 [id number] XX123456 XX123456 A999333 A999333 [id number]  Diastolic BP 76 74 85 90 76 102 82  Wt. (Lbs) 209 203 209 205 202 209 209  BMI 29.99 29.13 29.99 29.41 28.98 30.85 29.99   Foot/eye exam completion dates Latest Ref Rng 10/10/2015 05/06/2015  Eye Exam No Retinopathy No Retinopathy -  Foot Form Completion - - Done               Genitourinary   ED (erectile dysfunction)   Last Assessment & Plan 05/10/2012 Office Visit Written 05/15/2012  9:59 AM by [name], MD    Deterioration in performance, requests medication trial, wife present and nbi agreement        Other   Hyperlipidemia LDL goal <70   Last Assessment & Plan 06/17/2016 Office Visit Written 06/17/2016 10:40 PM by [name], MD    Hyperlipidemia:Low fat diet discussed and encouraged. Needs to reduce fat intake and commit to regular exercise   Lipid Panel  Lab Results  Component Value Date   CHOL 165 03/12/2016   HDL 33* 03/12/2016   LDLCALC 70 03/12/2016   LDLDIRECT TEST NOT PERFORMED 06/19/2011   TRIG 309* 03/12/2016   CHOLHDL 5.0 03/12/2016            Overweight   Last Assessment & Plan 10/09/2015 Office Visit Written 10/14/2015  5:19  AM by [name], MD    Deteriorated. Patient re-educated about  the importance of commitment to a  minimum of 150 minutes of exercise per week.  The importance of healthy food choices with portion control discussed. Encouraged to start a food diary, count calories and to consider  joining a support group. Sample diet sheets offered. Goals set by the patient for the next several months.   Weight /BMI 10/09/2015 05/06/2015 03/23/2015  WEIGHT 209 lb 205 lb 1.9 oz 194 lb 12.8 oz  HEIGHT 5\' 10"  5\' 10"  5\' 10"   BMI 29.99 kg/m2 29.43 kg/m2 27.95 kg/m2          Tobacco abuse   Last Assessment & Plan 04/27/2016 Office Visit Edited 04/28/2016  8:04 AM by [name], MD  Patient counseled s regarding the health risks of ongoing nicotine use, specifically all types of cancer, heart disease, stroke and respiratory failure. The options available for help with cessation ,the behavioral changes to assist the process, and the option to either gradully reduce usage  Or abruptly stop.is also discussed. Pt is also encouraged to set specific goals in number of cigarettes used daily, as well as to set a quit date.         Alcohol abuse   Last Assessment & Plan 06/17/2016 Office Visit Written 06/17/2016 10:39 PM by [name], MD    Ongoing dependence , refusing treatment states he can do this on his own, spouse present and encouraged to help him as best able      Cough   Last Assessment & Plan 05/10/2013 Office Visit Written 05/14/2013  9:32 PM by [name], MD    H/o exercise induce cough, proventil prescribed for use before exercise      Wheezing   Last Assessment & Plan 04/27/2016 Office Visit Written 04/28/2016  7:59 AM by [name], MD    Bilateral wheezes with reduced air entry and low oxygen Neb treatment in office      Abnormal echocardiogram   Insomnia   Last Assessment & Plan 06/17/2016 Office Visit Written 06/17/2016 10:38 PM by [name], MD     Sleep hygiene reviewed and written information offered also. Prescription sent for  medication needed.           Imaging: No results found.

## 2016-09-28 NOTE — Progress Notes (Signed)
   Subjective:    Patient ID: Antonio Cobb, male    DOB: 10-Jun-1961, 55 y.o.   MRN: [id number]  HPI    Review of Systems     Objective:   Physical Exam        Assessment & Plan:

## 2016-09-28 NOTE — Progress Notes (Addendum)
Cardiology Office Note   Date:  09/28/2016   ID:  Antonio Cobb, DOB 02-03-1961, MRN [id number]  PCP:  Tula Nakayama, MD  Cardiologist: Cloria Spring, NP   No chief complaint on file.     History of Present Illness: Antonio Cobb is a 55 y.o. male who presents for ongoing assessment and management of nonischemic cardiomyopathy, reduced ejection fraction of 30-35%, history of coronary artery disease with stent to the mid circumflex, hypertension, hyperlipidemia and additional history of COPD.  Most recent catheterization on 05/15/2016  Patent stent in the mid circumflex.  Moderate disease in the RCA and LAD.  Mild pulmonary hypertension.  Cardiac output 5.1 L/m. Cardiac index 2.4  Pulmonary artery saturation 66%.  He comes today feeling about the same with some increased work of breathing but he has run out of his inhalers. He is asked for refills and is due to pick them up today. He is also gone through a week and getting is unmarried and is exhausted from this. He admits to continuing alcohol consumption but has quit smoking. He is medically compliant. It is time for a follow-up echo   Past Medical History:  Diagnosis Date  . Alcohol use   . Diabetes mellitus, type 2 ([place])   . Elevated cholesterol   . Hyperlipidemia   . Hypertension   . Myocardial infarction 05/23/12  . Nicotine addiction   . Obesity   . Other and unspecified alcohol dependence, unspecified drinking behavior     Past Surgical History:  Procedure Laterality Date  . CARDIAC CATHETERIZATION  2 yrs ago  . CARDIAC CATHETERIZATION N/A 05/15/2016   Procedure: Right/Left Heart Cath and Coronary Angiography;  Surgeon: [name], MD;  Location: [place] [facility];  Service: Cardiovascular;  Laterality: N/A;  . CORONARY STENT PLACEMENT  05/23/12  . LEFT HEART CATHETERIZATION WITH CORONARY ANGIOGRAM N/A 05/23/2012   Procedure: LEFT HEART CATHETERIZATION WITH CORONARY ANGIOGRAM;   Surgeon: [name], MD;  [facility] C[facility] LAB;  Service: Cardiovascular;  Laterality: N/A;  . None    . PERCUTANEOUS CORONARY STENT INTERVENTION (PCI-S) N/A 05/23/2012   Procedure: PERCUTANEOUS CORONARY STENT INTERVENTION (PCI-S);  Surgeon: [name], MD;  Location: [facility] CATH LAB;  Service: Cardiovascular;  Laterality: N/A;  . POLYPECTOMY  10/16/2011   Procedure: POLYPECTOMY;  Surgeon: [name], MD;  Location: [facility];  Service: Endoscopy;;  Polypoid Lesion, Transverse and Sigmoid Colon     Current Outpatient Prescriptions  Medication Sig Dispense Refill  . albuterol (PROVENTIL HFA;VENTOLIN HFA) 108 (90 Base) MCG/ACT inhaler Two puffs before exercise , as needed, for  wheezing (Patient taking differently: Inhale 2 puffs into the lungs as needed for wheezing. Two puffs before exercise , as needed, for  wheezing) 18 g 2  . amLODipine (NORVASC) 10 MG tablet take 1 tablet once daily 30 tablet 4  . aspirin (ASPIRIN ADULT LOW STRENGTH) 81 MG EC tablet Take 81 mg by mouth daily.      [name] atorvastatin (LIPITOR) 40 MG tablet Take 1 tablet (40 mg total) by mouth daily. 90 tablet 1  . furosemide (LASIX) 40 MG tablet Take 1 tablet (40 mg total) by mouth daily. 90 tablet 1  . glucose blood test strip Four times daily testing dx [name] 150 each 1  . hydrochlorothiazide (HYDRODIURIL) 25 MG tablet take 1 tablet by mouth once daily 90 tablet 3  . insulin aspart (NOVOLOG) 100 UNIT/ML injection Inject 15 Units into the skin 3 (three)  times daily before meals. Sliding scale begins at 15 units and increases as CBG dictates (Patient taking differently: Inject 11-20 Units into the skin 3 (three) times daily before meals. Sliding scale per home instructions) 10 mL 0  . Insulin Glargine (LANTUS SOLOSTAR) 100 UNIT/ML Solostar Pen Inject 60 Units into the skin daily at 10 pm. Everynight    . ipratropium-albuterol (DUONEB) 0.5-2.5 (3) MG/3ML SOLN Take 3 mLs by nebulization every 6 (six) hours as needed. (Patient  taking differently: Take 3 mLs by nebulization every 6 (six) hours as needed (for wheezing or shortness of breath). ) 360 mL 0  . irbesartan (AVAPRO) 300 MG tablet take 1 tablet by mouth once daily 30 tablet 4  . Lancets (FREESTYLE) lancets Four times daily testing dx e11.65 150 each 1  . metoprolol succinate (TOPROL-XL) 50 MG 24 hr tablet Take 1 tablet (50 mg total) by mouth daily. Take with or immediately following a meal. 90 tablet 0  . montelukast (SINGULAIR) 10 MG tablet Take 1 tablet (10 mg total) by mouth at bedtime. 30 tablet 5  . nitroGLYCERIN (NITROSTAT) 0.4 MG SL tablet Place 1 tablet (0.4 mg total) under the tongue every 5 (five) minutes as needed for chest pain. 25 tablet 3  . potassium chloride SA (KLOR-CON M20) 20 MEQ tablet Take 1 tablet (20 mEq total) by mouth daily. 90 tablet 3  . SPIRIVA HANDIHALER 18 MCG inhalation capsule inhale the contents of one capsule in the handihaler once daily 30 capsule 0  . spironolactone (ALDACTONE) 25 MG tablet Take 1 tablet (25 mg total) by mouth daily. 90 tablet 1  . temazepam (RESTORIL) 7.5 MG capsule Take 1 capsule (7.5 mg total) by mouth at bedtime as needed for sleep. 30 capsule 3   No current facility-administered medications for this visit.     Allergies:   Review of patient's allergies indicates no known allergies.    Social History:  The patient  reports that he quit smoking about 4 months ago. His smoking use included Cigars. He has a 30.00 pack-year smoking history. He has never used smokeless tobacco. He reports that he drinks alcohol. He reports that he does not use drugs.   Family History:  The patient's family history includes Asthma in his sister; COPD in his father and mother; Diabetes in his mother; Emphysema in his father and mother; Heart attack in his mother; Heart disease in his mother; Hypertension in his mother; Stroke in his sister.    ROS: All other systems are reviewed and negative. Unless otherwise mentioned in H&P     PHYSICAL EXAM: VS:  There were no vitals taken for this visit. , BMI There is no height or weight on file to calculate BMI. GEN: Well nourished, well developed, in no acute distress  HEENT: normal  Neck: no JVD, carotid bruits, or masses Cardiac: IRRR; no murmurs, rubs, or gallops, 1+ pretibial edema  Respiratory:  Clear to auscultation bilaterally, normal work of breathing GI: soft, nontender, nondistended, + BS MS: no deformity or atrophy  Skin: warm and dry, no rash Neuro:  Strength and sensation are intact Psych: euthymic mood, full affect   Recent Labs: 03/12/2016: TSH 1.38 04/27/2016: B Natriuretic Peptide 357.0 05/12/2016: Hemoglobin 14.0; Platelets 273 06/17/2016: ALT 19; BUN 22; Creat 1.16; Potassium 3.9; Sodium 139    Lipid Panel    Component Value Date/Time   CHOL 165 03/12/2016 1111   TRIG 309 (H) 03/12/2016 1111   HDL 33 (L) 03/12/2016 1111  CHOLHDL 5.0 03/12/2016 1111   VLDL 62 (H) 03/12/2016 1111   LDLCALC 70 03/12/2016 1111   LDLDIRECT TEST NOT PERFORMED 06/19/2011 1230      Wt Readings from Last 3 Encounters:  06/17/16 209 lb (94.8 kg)  05/29/16 203 lb (92.1 kg)  05/15/16 209 lb (94.8 kg)     ASSESSMENT AND PLAN:  1. Chronic systolic heart failure with nonischemic cardiomyopathy: He feels about the same. His weight is been stable. Blood pressure is well-controlled. I'm going to repeat his echo to compare to July 2017 echo to evaluate for increase in systolic function. If there is no increase may consider taking him off of losartan, and starting him on Entresto. We'll check a BMET  2. Ongoing alcohol abuse: He states that this is his next step. He has stopped smoking and is now planning on cutting back on alcohol. I've encouraged him in this endeavor. May need to be connected with Alcoholics Anonymous for support.  3. Hypertension: Blood pressure is well-controlled for currently documented systolic function. Will not make any changes in his medication  right now until echocardiogram is completed.  4. Chronic asthma: He is due to fill his inhalers this afternoon.    Labs/ tests ordered today include:  No orders of the defined types were placed in this encounter.    Disposition:   Follow-up 3 months  Signed, Jory Sims, NP  09/28/2016 7:36 AM    [facility] [address] [place], [place] [address] Phone: [phone]; Fax: [phone]

## 2016-09-29 ENCOUNTER — Telehealth: Payer: Self-pay | Admitting: *Deleted

## 2016-09-29 LAB — BASIC METABOLIC PANEL
BUN: 20 mg/dL (ref 7–25)
CALCIUM: 8.8 mg/dL (ref 8.6–10.3)
CO2: 27 mmol/L (ref 20–31)
CREATININE: 1.29 mg/dL (ref 0.70–1.33)
Chloride: 100 mmol/L (ref 98–110)
GLUCOSE: 361 mg/dL — AB (ref 65–99)
POTASSIUM: 3.6 mmol/L (ref 3.5–5.3)
Sodium: 138 mmol/L (ref 135–146)

## 2016-09-29 NOTE — Telephone Encounter (Signed)
Called patient with test results. No answer. Left message to call back.  

## 2016-09-29 NOTE — Telephone Encounter (Signed)
-----   Message from Lendon Colonel, NP sent at 09/29/2016  8:25 AM EDT ----- Glucose very elevated. Please send copy to PCP and have him make appointment for follow up if one is not already scheduled.

## 2016-10-06 ENCOUNTER — Ambulatory Visit (HOSPITAL_COMMUNITY)
Admission: RE | Admit: 2016-10-06 | Discharge: 2016-10-06 | Disposition: A | Discharge status: Home / Self Care | Payer: BLUE CROSS/BLUE SHIELD | Source: Ambulatory Visit | Attending: Adult Health | Admitting: Adult Health

## 2016-10-06 DIAGNOSIS — Z6829 Body mass index (BMI) 29.0-29.9, adult: Secondary | ICD-10-CM | POA: Diagnosis not present

## 2016-10-06 DIAGNOSIS — I340 Nonrheumatic mitral (valve) insufficiency: Secondary | ICD-10-CM | POA: Diagnosis not present

## 2016-10-06 DIAGNOSIS — Z87891 Personal history of nicotine dependence: Secondary | ICD-10-CM | POA: Insufficient documentation

## 2016-10-06 DIAGNOSIS — E785 Hyperlipidemia, unspecified: Secondary | ICD-10-CM | POA: Diagnosis not present

## 2016-10-06 DIAGNOSIS — F1010 Alcohol abuse, uncomplicated: Secondary | ICD-10-CM | POA: Insufficient documentation

## 2016-10-06 DIAGNOSIS — I429 Cardiomyopathy, unspecified: Secondary | ICD-10-CM | POA: Diagnosis present

## 2016-10-06 DIAGNOSIS — R29898 Other symptoms and signs involving the musculoskeletal system: Secondary | ICD-10-CM | POA: Insufficient documentation

## 2016-10-06 DIAGNOSIS — I351 Nonrheumatic aortic (valve) insufficiency: Secondary | ICD-10-CM | POA: Diagnosis not present

## 2016-10-06 DIAGNOSIS — I071 Rheumatic tricuspid insufficiency: Secondary | ICD-10-CM | POA: Insufficient documentation

## 2016-10-06 DIAGNOSIS — E119 Type 2 diabetes mellitus without complications: Secondary | ICD-10-CM | POA: Diagnosis not present

## 2016-10-06 DIAGNOSIS — I119 Hypertensive heart disease without heart failure: Secondary | ICD-10-CM | POA: Diagnosis not present

## 2016-10-06 DIAGNOSIS — E663 Overweight: Secondary | ICD-10-CM | POA: Insufficient documentation

## 2016-10-06 NOTE — Progress Notes (Signed)
*  PRELIMINARY RESULTS* Echocardiogram 2D Echocardiogram has been performed.  Antonio Cobb 10/06/2016, 11:27 AM

## 2016-10-07 ENCOUNTER — Encounter (HOSPITAL_COMMUNITY): Payer: Self-pay | Admitting: *Deleted

## 2016-10-07 ENCOUNTER — Inpatient Hospital Stay (HOSPITAL_COMMUNITY)
Admission: EM | Admit: 2016-10-07 | Discharge: 2016-10-09 | DRG: 291 | Disposition: A | Discharge status: Home / Self Care | Payer: BLUE CROSS/BLUE SHIELD | Attending: Family Medicine | Admitting: Family Medicine

## 2016-10-07 ENCOUNTER — Telehealth: Payer: Self-pay | Admitting: *Deleted

## 2016-10-07 ENCOUNTER — Emergency Department (HOSPITAL_COMMUNITY): Payer: BLUE CROSS/BLUE SHIELD

## 2016-10-07 DIAGNOSIS — E669 Obesity, unspecified: Secondary | ICD-10-CM | POA: Diagnosis present

## 2016-10-07 DIAGNOSIS — I272 Pulmonary hypertension, unspecified: Secondary | ICD-10-CM | POA: Diagnosis present

## 2016-10-07 DIAGNOSIS — Z79899 Other long term (current) drug therapy: Secondary | ICD-10-CM

## 2016-10-07 DIAGNOSIS — Z7982 Long term (current) use of aspirin: Secondary | ICD-10-CM

## 2016-10-07 DIAGNOSIS — Z825 Family history of asthma and other chronic lower respiratory diseases: Secondary | ICD-10-CM | POA: Diagnosis not present

## 2016-10-07 DIAGNOSIS — J9601 Acute respiratory failure with hypoxia: Secondary | ICD-10-CM | POA: Diagnosis present

## 2016-10-07 DIAGNOSIS — E782 Mixed hyperlipidemia: Secondary | ICD-10-CM | POA: Diagnosis present

## 2016-10-07 DIAGNOSIS — I428 Other cardiomyopathies: Secondary | ICD-10-CM | POA: Diagnosis not present

## 2016-10-07 DIAGNOSIS — R9431 Abnormal electrocardiogram [ECG] [EKG]: Secondary | ICD-10-CM

## 2016-10-07 DIAGNOSIS — E1165 Type 2 diabetes mellitus with hyperglycemia: Secondary | ICD-10-CM | POA: Diagnosis present

## 2016-10-07 DIAGNOSIS — Z955 Presence of coronary angioplasty implant and graft: Secondary | ICD-10-CM

## 2016-10-07 DIAGNOSIS — N179 Acute kidney failure, unspecified: Secondary | ICD-10-CM | POA: Diagnosis present

## 2016-10-07 DIAGNOSIS — Z794 Long term (current) use of insulin: Secondary | ICD-10-CM

## 2016-10-07 DIAGNOSIS — J449 Chronic obstructive pulmonary disease, unspecified: Secondary | ICD-10-CM | POA: Diagnosis present

## 2016-10-07 DIAGNOSIS — J810 Acute pulmonary edema: Secondary | ICD-10-CM | POA: Diagnosis not present

## 2016-10-07 DIAGNOSIS — Z87891 Personal history of nicotine dependence: Secondary | ICD-10-CM | POA: Diagnosis not present

## 2016-10-07 DIAGNOSIS — I252 Old myocardial infarction: Secondary | ICD-10-CM

## 2016-10-07 DIAGNOSIS — Z23 Encounter for immunization: Secondary | ICD-10-CM | POA: Diagnosis not present

## 2016-10-07 DIAGNOSIS — I081 Rheumatic disorders of both mitral and tricuspid valves: Secondary | ICD-10-CM | POA: Diagnosis present

## 2016-10-07 DIAGNOSIS — I5023 Acute on chronic systolic (congestive) heart failure: Secondary | ICD-10-CM | POA: Diagnosis not present

## 2016-10-07 DIAGNOSIS — I5043 Acute on chronic combined systolic (congestive) and diastolic (congestive) heart failure: Secondary | ICD-10-CM | POA: Diagnosis present

## 2016-10-07 DIAGNOSIS — Z8249 Family history of ischemic heart disease and other diseases of the circulatory system: Secondary | ICD-10-CM | POA: Diagnosis not present

## 2016-10-07 DIAGNOSIS — I429 Cardiomyopathy, unspecified: Secondary | ICD-10-CM | POA: Diagnosis present

## 2016-10-07 DIAGNOSIS — E1049 Type 1 diabetes mellitus with other diabetic neurological complication: Secondary | ICD-10-CM

## 2016-10-07 DIAGNOSIS — I2510 Atherosclerotic heart disease of native coronary artery without angina pectoris: Secondary | ICD-10-CM | POA: Diagnosis present

## 2016-10-07 DIAGNOSIS — F1010 Alcohol abuse, uncomplicated: Secondary | ICD-10-CM | POA: Diagnosis not present

## 2016-10-07 DIAGNOSIS — I110 Hypertensive heart disease with heart failure: Principal | ICD-10-CM | POA: Diagnosis present

## 2016-10-07 DIAGNOSIS — Z720 Tobacco use: Secondary | ICD-10-CM | POA: Diagnosis present

## 2016-10-07 DIAGNOSIS — I25709 Atherosclerosis of coronary artery bypass graft(s), unspecified, with unspecified angina pectoris: Secondary | ICD-10-CM | POA: Insufficient documentation

## 2016-10-07 DIAGNOSIS — E1065 Type 1 diabetes mellitus with hyperglycemia: Secondary | ICD-10-CM

## 2016-10-07 HISTORY — DX: Chronic obstructive pulmonary disease, unspecified: J44.9

## 2016-10-07 HISTORY — DX: Alcohol abuse, uncomplicated: F10.10

## 2016-10-07 HISTORY — DX: Essential (primary) hypertension: I10

## 2016-10-07 HISTORY — DX: Atherosclerotic heart disease of native coronary artery without angina pectoris: I25.10

## 2016-10-07 HISTORY — DX: Mixed hyperlipidemia: E78.2

## 2016-10-07 HISTORY — DX: Cardiomyopathy, unspecified: I42.9

## 2016-10-07 HISTORY — DX: Nonrheumatic mitral (valve) insufficiency: I34.0

## 2016-10-07 LAB — CBC WITH DIFFERENTIAL/PLATELET
BASOS ABS: 0.2 10*3/uL — AB (ref 0.0–0.1)
Basophils Relative: 1 %
EOS PCT: 9 %
Eosinophils Absolute: 1.4 10*3/uL — ABNORMAL HIGH (ref 0.0–0.7)
HCT: 47.4 % (ref 39.0–52.0)
Hemoglobin: 15.8 g/dL (ref 13.0–17.0)
LYMPHS ABS: 5.2 10*3/uL — AB (ref 0.7–4.0)
Lymphocytes Relative: 33 %
MCH: 29.4 pg (ref 26.0–34.0)
MCHC: 33.3 g/dL (ref 30.0–36.0)
MCV: 88.1 fL (ref 78.0–100.0)
MONO ABS: 1.1 10*3/uL — AB (ref 0.1–1.0)
Monocytes Relative: 7 %
NEUTROS PCT: 50 %
Neutro Abs: 8 10*3/uL — ABNORMAL HIGH (ref 1.7–7.7)
PLATELETS: 283 10*3/uL (ref 150–400)
RBC: 5.38 MIL/uL (ref 4.22–5.81)
RDW: 12.5 % (ref 11.5–15.5)
WBC: 15.9 10*3/uL — AB (ref 4.0–10.5)

## 2016-10-07 LAB — COMPREHENSIVE METABOLIC PANEL
ALT: 57 U/L (ref 17–63)
AST: 53 U/L — AB (ref 15–41)
Albumin: 4.1 g/dL (ref 3.5–5.0)
Alkaline Phosphatase: 91 U/L (ref 38–126)
Anion gap: 10 (ref 5–15)
BILIRUBIN TOTAL: 0.9 mg/dL (ref 0.3–1.2)
BUN: 21 mg/dL — AB (ref 6–20)
CHLORIDE: 100 mmol/L — AB (ref 101–111)
CO2: 25 mmol/L (ref 22–32)
CREATININE: 1.41 mg/dL — AB (ref 0.61–1.24)
Calcium: 8.9 mg/dL (ref 8.9–10.3)
GFR, EST NON AFRICAN AMERICAN: 54 mL/min — AB (ref >60)
Glucose, Bld: 406 mg/dL — ABNORMAL HIGH (ref 65–99)
POTASSIUM: 3.6 mmol/L (ref 3.5–5.1)
Sodium: 135 mmol/L (ref 135–145)
TOTAL PROTEIN: 8.1 g/dL (ref 6.5–8.1)

## 2016-10-07 LAB — GLUCOSE, CAPILLARY
GLUCOSE-CAPILLARY: 366 mg/dL — AB (ref 65–99)
Glucose-Capillary: 411 mg/dL — ABNORMAL HIGH (ref 65–99)
Glucose-Capillary: 296 mg/dL — ABNORMAL HIGH (ref 65–99)

## 2016-10-07 LAB — BRAIN NATRIURETIC PEPTIDE: B NATRIURETIC PEPTIDE 5: 374 pg/mL — AB (ref 0.0–100.0)

## 2016-10-07 LAB — TROPONIN I
TROPONIN I: 0.03 ng/mL — AB (ref <0.03)
TROPONIN I: 0.03 ng/mL — AB (ref <0.03)
TROPONIN I: 0.03 ng/mL — AB (ref <0.03)

## 2016-10-07 LAB — MRSA PCR SCREENING: MRSA BY PCR: NEGATIVE

## 2016-10-07 MED ORDER — INSULIN ASPART 100 UNIT/ML ~~LOC~~ SOLN
0.0000 [IU] | Freq: Every day | SUBCUTANEOUS | Status: DC
  Administered 2016-10-07: 3 [IU] via SUBCUTANEOUS

## 2016-10-07 MED ORDER — INSULIN GLARGINE 100 UNIT/ML ~~LOC~~ SOLN
60.0000 [IU] | Freq: Every day | SUBCUTANEOUS | Status: DC
  Administered 2016-10-07 – 2016-10-08 (×2): 60 [IU] via SUBCUTANEOUS
  Filled 2016-10-07 (×5): qty 0.6

## 2016-10-07 MED ORDER — MAGNESIUM SULFATE 2 GM/50ML IV SOLN
INTRAVENOUS | Status: AC
  Filled 2016-10-07: qty 50

## 2016-10-07 MED ORDER — TIOTROPIUM BROMIDE MONOHYDRATE 18 MCG IN CAPS
18.0000 ug | ORAL_CAPSULE | Freq: Every day | RESPIRATORY_TRACT | Status: DC
Start: 2016-10-07 — End: 2016-10-09
  Administered 2016-10-08 – 2016-10-09 (×2): 18 ug via RESPIRATORY_TRACT
  Filled 2016-10-07: qty 5

## 2016-10-07 MED ORDER — METHYLPREDNISOLONE SODIUM SUCC 125 MG IJ SOLR
INTRAMUSCULAR | Status: AC
  Filled 2016-10-07: qty 2

## 2016-10-07 MED ORDER — SODIUM CHLORIDE 0.9% FLUSH: 3.0000 mL | INTRAVENOUS | Status: DC | PRN

## 2016-10-07 MED ORDER — MAGNESIUM SULFATE 2 GM/50ML IV SOLN
2.0000 g | Freq: Once | INTRAVENOUS | Status: AC
  Administered 2016-10-07: 2 g via INTRAVENOUS

## 2016-10-07 MED ORDER — PNEUMOCOCCAL VAC POLYVALENT 25 MCG/0.5ML IJ INJ
0.5000 mL | INJECTION | INTRAMUSCULAR | Status: AC
  Administered 2016-10-08: 0.5 mL via INTRAMUSCULAR
  Filled 2016-10-07: qty 0.5

## 2016-10-07 MED ORDER — NITROGLYCERIN IN D5W 200-5 MCG/ML-% IV SOLN
INTRAVENOUS | Status: AC
  Filled 2016-10-07: qty 250

## 2016-10-07 MED ORDER — VITAMIN B-1 100 MG PO TABS
100.0000 mg | ORAL_TABLET | Freq: Every day | ORAL | Status: DC
  Administered 2016-10-07 – 2016-10-09 (×3): 100 mg via ORAL
  Filled 2016-10-07 (×3): qty 1

## 2016-10-07 MED ORDER — AMLODIPINE BESYLATE 5 MG PO TABS
10.0000 mg | ORAL_TABLET | Freq: Every day | ORAL | Status: DC
  Administered 2016-10-07 – 2016-10-09 (×3): 10 mg via ORAL
  Filled 2016-10-07 (×3): qty 2

## 2016-10-07 MED ORDER — INSULIN ASPART 100 UNIT/ML ~~LOC~~ SOLN
0.0000 [IU] | Freq: Three times a day (TID) | SUBCUTANEOUS | Status: DC
  Administered 2016-10-07: 20 [IU] via SUBCUTANEOUS
  Administered 2016-10-08: 11 [IU] via SUBCUTANEOUS
  Administered 2016-10-08: 15 [IU] via SUBCUTANEOUS
  Administered 2016-10-08 – 2016-10-09 (×3): 4 [IU] via SUBCUTANEOUS

## 2016-10-07 MED ORDER — FUROSEMIDE 10 MG/ML IJ SOLN
40.0000 mg | Freq: Once | INTRAMUSCULAR | Status: AC
  Administered 2016-10-07: 40 mg via INTRAVENOUS
  Filled 2016-10-07: qty 4

## 2016-10-07 MED ORDER — ADULT MULTIVITAMIN W/MINERALS CH
1.0000 | ORAL_TABLET | Freq: Every day | ORAL | Status: DC
  Administered 2016-10-07 – 2016-10-09 (×3): 1 via ORAL
  Filled 2016-10-07 (×3): qty 1

## 2016-10-07 MED ORDER — LORAZEPAM 2 MG/ML IJ SOLN: 1.0000 mg | Freq: Four times a day (QID) | INTRAMUSCULAR | Status: DC | PRN

## 2016-10-07 MED ORDER — NITROGLYCERIN IN D5W 200-5 MCG/ML-% IV SOLN
5.0000 ug/min | Freq: Once | INTRAVENOUS | Status: AC
  Administered 2016-10-07: 200 ug/min via INTRAVENOUS

## 2016-10-07 MED ORDER — INSULIN ASPART 100 UNIT/ML ~~LOC~~ SOLN
25.0000 [IU] | Freq: Once | SUBCUTANEOUS | Status: AC
Start: 2016-10-07 — End: 2016-10-07
  Administered 2016-10-07: 25 [IU] via SUBCUTANEOUS

## 2016-10-07 MED ORDER — METHYLPREDNISOLONE SODIUM SUCC 125 MG IJ SOLR
125.0000 mg | Freq: Once | INTRAMUSCULAR | Status: AC
  Administered 2016-10-07: 125 mg via INTRAVENOUS

## 2016-10-07 MED ORDER — SODIUM CHLORIDE 0.9 % IV SOLN: 250.0000 mL | INTRAVENOUS | Status: DC | PRN

## 2016-10-07 MED ORDER — ENOXAPARIN SODIUM 40 MG/0.4ML ~~LOC~~ SOLN
40.0000 mg | SUBCUTANEOUS | Status: DC
  Administered 2016-10-07 – 2016-10-08 (×2): 40 mg via SUBCUTANEOUS
  Filled 2016-10-07 (×2): qty 0.4

## 2016-10-07 MED ORDER — ATORVASTATIN CALCIUM 40 MG PO TABS
40.0000 mg | ORAL_TABLET | Freq: Every day | ORAL | Status: DC
  Administered 2016-10-07 – 2016-10-08 (×2): 40 mg via ORAL
  Filled 2016-10-07 (×2): qty 1

## 2016-10-07 MED ORDER — NITROGLYCERIN 0.4 MG SL SUBL: 0.4000 mg | SUBLINGUAL_TABLET | SUBLINGUAL | Status: DC | PRN

## 2016-10-07 MED ORDER — ALBUTEROL (5 MG/ML) CONTINUOUS INHALATION SOLN
INHALATION_SOLUTION | RESPIRATORY_TRACT | Status: AC
  Filled 2016-10-07: qty 20

## 2016-10-07 MED ORDER — SODIUM CHLORIDE 0.9% FLUSH
3.0000 mL | Freq: Two times a day (BID) | INTRAVENOUS | Status: DC
  Administered 2016-10-07: 22:00:00 via INTRAVENOUS
  Administered 2016-10-08 – 2016-10-09 (×2): 3 mL via INTRAVENOUS

## 2016-10-07 MED ORDER — LORAZEPAM 1 MG PO TABS: 1.0000 mg | ORAL_TABLET | Freq: Four times a day (QID) | ORAL | Status: DC | PRN

## 2016-10-07 MED ORDER — TEMAZEPAM 7.5 MG PO CAPS: 7.5000 mg | ORAL_CAPSULE | Freq: Every evening | ORAL | Status: DC | PRN

## 2016-10-07 MED ORDER — IPRATROPIUM BROMIDE 0.02 % IN SOLN
RESPIRATORY_TRACT | Status: AC
  Filled 2016-10-07: qty 2.5

## 2016-10-07 MED ORDER — IRBESARTAN 300 MG PO TABS
300.0000 mg | ORAL_TABLET | Freq: Every day | ORAL | Status: DC
  Administered 2016-10-07: 300 mg via ORAL
  Filled 2016-10-07: qty 1

## 2016-10-07 MED ORDER — NITROGLYCERIN 0.4 MG SL SUBL
0.4000 mg | SUBLINGUAL_TABLET | SUBLINGUAL | Status: DC
  Administered 2016-10-07: 0.4 mg via SUBLINGUAL

## 2016-10-07 MED ORDER — NITROGLYCERIN IN D5W 200-5 MCG/ML-% IV SOLN
5.0000 ug/min | Freq: Once | INTRAVENOUS | Status: DC
  Filled 2016-10-07: qty 250

## 2016-10-07 MED ORDER — FOLIC ACID 1 MG PO TABS
1.0000 mg | ORAL_TABLET | Freq: Every day | ORAL | Status: DC
  Administered 2016-10-08: 1 mg via ORAL
  Filled 2016-10-07 (×3): qty 1

## 2016-10-07 MED ORDER — NITROGLYCERIN 0.4 MG SL SUBL
SUBLINGUAL_TABLET | SUBLINGUAL | Status: AC
  Filled 2016-10-07: qty 3

## 2016-10-07 MED ORDER — ONDANSETRON HCL 4 MG/2ML IJ SOLN
4.0000 mg | Freq: Four times a day (QID) | INTRAMUSCULAR | Status: DC | PRN
Start: 2016-10-07 — End: 2016-10-09

## 2016-10-07 MED ORDER — MONTELUKAST SODIUM 10 MG PO TABS
10.0000 mg | ORAL_TABLET | Freq: Every day | ORAL | Status: DC
  Administered 2016-10-07 – 2016-10-08 (×2): 10 mg via ORAL
  Filled 2016-10-07 (×2): qty 1

## 2016-10-07 MED ORDER — IPRATROPIUM-ALBUTEROL 0.5-2.5 (3) MG/3ML IN SOLN: 3.0000 mL | Freq: Four times a day (QID) | RESPIRATORY_TRACT | Status: DC | PRN

## 2016-10-07 MED ORDER — POTASSIUM CHLORIDE CRYS ER 20 MEQ PO TBCR
20.0000 meq | EXTENDED_RELEASE_TABLET | Freq: Every day | ORAL | Status: DC
  Administered 2016-10-07 – 2016-10-09 (×3): 20 meq via ORAL
  Filled 2016-10-07 (×3): qty 1

## 2016-10-07 MED ORDER — ALBUTEROL (5 MG/ML) CONTINUOUS INHALATION SOLN
15.0000 mg/h | INHALATION_SOLUTION | Freq: Once | RESPIRATORY_TRACT | Status: AC
  Administered 2016-10-07: 15 mg/h via RESPIRATORY_TRACT

## 2016-10-07 MED ORDER — ACETAMINOPHEN 325 MG PO TABS: 650.0000 mg | ORAL_TABLET | ORAL | Status: DC | PRN

## 2016-10-07 MED ORDER — ASPIRIN 81 MG PO CHEW
324.0000 mg | CHEWABLE_TABLET | Freq: Once | ORAL | Status: AC
  Administered 2016-10-07: 324 mg via ORAL
  Filled 2016-10-07: qty 4

## 2016-10-07 MED ORDER — IPRATROPIUM BROMIDE 0.02 % IN SOLN
0.5000 mg | Freq: Once | RESPIRATORY_TRACT | Status: AC
  Administered 2016-10-07: 0.5 mg via RESPIRATORY_TRACT

## 2016-10-07 MED ORDER — ASPIRIN EC 81 MG PO TBEC
81.0000 mg | DELAYED_RELEASE_TABLET | Freq: Every day | ORAL | Status: DC
  Administered 2016-10-08 – 2016-10-09 (×2): 81 mg via ORAL
  Filled 2016-10-07 (×2): qty 1

## 2016-10-07 MED ORDER — THIAMINE HCL 100 MG/ML IJ SOLN: 100.0000 mg | Freq: Every day | INTRAMUSCULAR | Status: DC

## 2016-10-07 MED ORDER — FUROSEMIDE 10 MG/ML IJ SOLN
60.0000 mg | Freq: Two times a day (BID) | INTRAMUSCULAR | Status: DC
  Administered 2016-10-07 – 2016-10-08 (×2): 60 mg via INTRAVENOUS
  Filled 2016-10-07 (×2): qty 6

## 2016-10-07 MED ORDER — INFLUENZA VAC SPLIT QUAD 0.5 ML IM SUSY
0.5000 mL | PREFILLED_SYRINGE | INTRAMUSCULAR | Status: AC
  Administered 2016-10-08: 0.5 mL via INTRAMUSCULAR
  Filled 2016-10-07: qty 0.5

## 2016-10-07 MED ORDER — SPIRONOLACTONE 25 MG PO TABS
25.0000 mg | ORAL_TABLET | Freq: Every day | ORAL | Status: DC
  Administered 2016-10-07 – 2016-10-09 (×3): 25 mg via ORAL
  Filled 2016-10-07 (×3): qty 1

## 2016-10-07 NOTE — Telephone Encounter (Signed)
-----   Message from Lendon Colonel, NP sent at 10/06/2016  4:15 PM EDT ----- No change in his echocardiogram concerning LV function. His cardiac function remains low. Follow up appointment to discuss with his cardiologist or with me if not already scheduled.

## 2016-10-07 NOTE — ED Notes (Signed)
hospitalist in with pt

## 2016-10-07 NOTE — Telephone Encounter (Signed)
Called patient with test results. No answer. Left message to call back.  

## 2016-10-07 NOTE — ED Notes (Signed)
Pt decreased respiratory distress on bi-pap.

## 2016-10-07 NOTE — ED Notes (Signed)
CRITICAL VALUE ALERT  Critical value received: Trop 0.03  Date of notification:  10/07/16  Time of notification:  0809  Critical value read back:Yes.    Nurse who received alert:  Charmayne Sheer, RN  MD notified (1st page):  Oleta Mouse

## 2016-10-07 NOTE — Consult Note (Signed)
Cardiology Consultation   Patient ID: Antonio Cobb; [id number]; 15-Oct-1961   Admit date: 10/07/2016 Date of Consult: 10/07/2016  Referring MD:  Dr. Sarajane Jews Cardiologist: Dr. Harl Bowie Consulting Cardiologist: Dr. Domenic Polite  Patient Care Team: [name], MD as PCP - [place], MD (Gastroenterology) Arnoldo Lenis, MD as Consulting Physician (Cardiology)    Reason for Consultation: Pulmonary edema   History of Present Illness: Antonio Cobb is a 55 y.o. male with a hx of CAD status post inferolateral STEMI June 2013 treated with DES to the circumflex LVEF 55% that time. He most recently has developed a nonischemic cardiomyopathy with cath 06/2016 showing patent stent in the mid circumflex, moderate disease in the RCA and LAD, mild pulmonary hypertension. It was felt related to his heavy alcohol use. Repeat 2-D echo was actually done yesterday and showed no improvement in his LVEF at 30-35%. Also has history of hypertension, HLD, and COPD.  Patient states that he has been intermittently short of breath and experiencing orthopnea over the last month, was at work this morning and became really short of breath and was brought to the emergency room where he was found to be in pulmonary edema. He was initially treated with bronchodilators and steroids but his respiratory status deteriorated and he was placed on nitroglycerin infusion and given IV Lasix and started on BiPAP. Blood pressure was elevated at presentation. He has not had any chest pain with these symptoms.  He does report dietary indiscretion recently. Patient said he had country ham yesterday and the day before was eating soup that he added extra salt to. He denies any chest pain, palpitations, dizziness or presyncope. He thought was an asthma attack. He is still drinking a half a pint a day. He did quit smoking.  Past Medical History:  Diagnosis Date  . Alcohol abuse   . CAD (coronary artery  disease)    DES to circumflex June 2013  . Cardiomyopathy (HCC)    LVEF 30-35%  . COPD (chronic obstructive pulmonary disease) ([place])   . Diabetes mellitus, type 2 ([place])   . Essential hypertension   . Mitral regurgitation    Moderate  . Mixed hyperlipidemia   . Myocardial infarction 05/23/2012   Inferolateral STEMI  . Obesity     Past Surgical History:  Procedure Laterality Date  . CARDIAC CATHETERIZATION  2 yrs ago  . CARDIAC CATHETERIZATION N/A 05/15/2016   Procedure: Right/Left Heart Cath and Coronary Angiography;  Surgeon: [name], MD;  Location: [facility];  Service: Cardiovascular;  Laterality: N/A;  . CORONARY STENT PLACEMENT  05/23/12  . LEFT HEART CATHETERIZATION WITH CORONARY ANGIOGRAM N/A 05/23/2012   Procedure: LEFT HEART CATHETERIZATION WITH CORONARY ANGIOGRAM;  Surgeon: [name], MD;  Location: [facility] CATH LAB;  Service: Cardiovascular;  Laterality: N/A;  . None    . PERCUTANEOUS CORONARY STENT INTERVENTION (PCI-S) N/A 05/23/2012   Procedure: PERCUTANEOUS CORONARY STENT INTERVENTION (PCI-S);  Surgeon: [name], MD;  Location: [facility] CATH LAB;  Service: Cardiovascular;  Laterality: N/A;  . POLYPECTOMY  10/16/2011   Procedure: POLYPECTOMY;  Surgeon: [name], MD;  Location: [facility];  Service: Endoscopy;;  Polypoid Lesion, Transverse and Sigmoid Colon      Home Meds: Prior to Admission medications   Medication Sig Start Date End Date Taking? Authorizing Provider  albuterol (PROVENTIL HFA;VENTOLIN HFA) 108 (90 Base) MCG/ACT inhaler Two puffs before exercise , as needed, for  wheezing Patient taking differently: Inhale 2 puffs into  the lungs as needed for wheezing. Two puffs before exercise , as needed, for  wheezing 03/25/16  Yes [name], MD  amLODipine (NORVASC) 10 MG tablet take 1 tablet once daily 01/13/16  Yes [name], MD  aspirin (ASPIRIN ADULT LOW STRENGTH) 81 MG EC tablet Take 81 mg by mouth daily.     Yes Historical  Provider, MD  atorvastatin (LIPITOR) 40 MG tablet Take 1 tablet (40 mg total) by mouth daily. 06/17/16  Yes [name], MD  furosemide (LASIX) 40 MG tablet Take 1 tablet (40 mg total) by mouth daily. 06/17/16  Yes [name], MD  hydrochlorothiazide (HYDRODIURIL) 25 MG tablet take 1 tablet by mouth once daily 05/29/16  Yes Lendon Colonel, NP  insulin aspart (NOVOLOG) 100 UNIT/ML injection Inject 15 Units into the skin 3 (three) times daily before meals. Sliding scale begins at 15 units and increases as CBG dictates Patient taking differently: Inject 10-30 Units into the skin 3 (three) times daily before meals. Sliding scale per home instructions 03/23/15  Yes Orpah Greek, MD  Insulin Glargine (LANTUS SOLOSTAR) 100 UNIT/ML Solostar Pen Inject 60 Units into the skin daily at 10 pm. Everynight   Yes Historical Provider, MD  ipratropium-albuterol (DUONEB) 0.5-2.5 (3) MG/3ML SOLN Take 3 mLs by nebulization every 6 (six) hours as needed. Patient taking differently: Take 3 mLs by nebulization every 6 (six) hours as needed (for wheezing or shortness of breath).  04/27/16  Yes [name], MD  irbesartan (AVAPRO) 300 MG tablet take 1 tablet by mouth once daily 01/13/16  Yes [name], MD  metoprolol succinate (TOPROL-XL) 50 MG 24 hr tablet Take 1 tablet (50 mg total) by mouth daily. Take with or immediately following a meal. 06/29/16  Yes [name], MD  nitroGLYCERIN (NITROSTAT) 0.4 MG SL tablet Place 1 tablet (0.4 mg total) under the tongue every 5 (five) minutes as needed for chest pain. 05/29/16 11/06/19 Yes Lendon Colonel, NP  potassium chloride SA (KLOR-CON M20) 20 MEQ tablet Take 1 tablet (20 mEq total) by mouth daily. 05/14/16  Yes Lendon Colonel, NP  spironolactone (ALDACTONE) 25 MG tablet Take 1 tablet (25 mg total) by mouth daily. 06/17/16  Yes [name], MD  glucose blood test strip Four times daily testing dx [name] 03/12/16   [name], MD  Lancets (FREESTYLE) lancets Four times daily testing dx e11.65 03/12/16   [name], MD  [facility] HANDIHALER 18 MCG inhalation capsule inhale the contents of one capsule in the handihaler once daily 04/30/15   [name], MD  temazepam (RESTORIL) 7.5 MG capsule Take 1 capsule (7.5 mg total) by mouth at bedtime as needed for sleep. 06/17/16   [name], MD    Current Medications: . amLODipine  10 mg Oral Daily  . [START ON 10/08/2016] aspirin EC  81 mg Oral Daily  . atorvastatin  40 mg Oral q1800  . enoxaparin (LOVENOX) injection  40 mg Subcutaneous Q24H  . folic acid  1 mg Oral Daily  . furosemide  60 mg Intravenous Q12H  . [START ON 10/08/2016] Influenza vac split quadrivalent PF  0.5 mL Intramuscular Tomorrow-1000  . insulin aspart  0-20 Units Subcutaneous TID WC  . insulin aspart  0-5 Units Subcutaneous QHS  . insulin glargine  60 Units Subcutaneous Q2200  . irbesartan  300 mg Oral Daily  . montelukast  10 mg Oral QHS  . multivitamin with  minerals  1 tablet Oral Daily  . nitroGLYCERIN  5-200 mcg/min Intravenous Once  . [START ON 10/08/2016] pneumococcal 23 valent vaccine  0.5 mL Intramuscular Tomorrow-1000  . potassium chloride SA  20 mEq Oral Daily  . sodium chloride flush  3 mL Intravenous Q12H  . spironolactone  25 mg Oral Daily  . thiamine  100 mg Oral Daily   Or  . thiamine  100 mg Intravenous Daily  . tiotropium  18 mcg Inhalation Daily     Allergies:   No Known Allergies  Social History:   The patient  reports that he quit smoking about 4 months ago. His smoking use included Cigars. He has a 30.00 pack-year smoking history. He has never used smokeless tobacco. He reports that he drinks alcohol. He reports that he does not use drugs.    Family History:   The patient's family history includes Asthma in his sister; COPD in his father and mother; Diabetes in his mother; Emphysema in his father and mother; Heart attack in his mother; Heart  disease in his mother; Hypertension in his mother; Stroke in his sister.   ROS:  Please see the history of present illness.  Review of Systems  Constitution: Negative.  HENT: Negative.   Cardiovascular: Positive for dyspnea on exertion.  Respiratory: Positive for shortness of breath and wheezing.   Endocrine: Negative.   Hematologic/Lymphatic: Negative.   Musculoskeletal: Negative.   Gastrointestinal: Negative.   Genitourinary: Negative.   Neurological: Negative.    All other ROS reviewed and negative.      Vital Signs: Blood pressure 115/75, pulse 99, temperature (!) 96.8 F (36 C), temperature source Axillary, resp. rate (!) 22, height 5\' 9"  (1.753 m), weight 210 lb 5.1 oz (95.4 kg), SpO2 95 %.   PHYSICAL EXAM: General:  Overweight male, no distress.  HEENT: normal Lymph: no adenopathy Neck: no JVD Endocrine:  No thryomegaly Vascular: No carotid bruits; FA pulses 2+ bilaterally without bruits  Cardiac:  RRR; distant heart sounds normal S1, S2; no murmur, rub, bruit, thrill, or heave Lungs:  Rales bilaterally at the bases  Abd: Distended soft, nontender, no hepatomegaly  Ext: no edema, Good distal pulses bilaterally Musculoskeletal:  No deformities, BUE and BLE strength normal and equal Skin: warm and dry  Neuro:  CNs 2-12 intact, no focal abnormalities noted Psych:  Normal affect    EKG: Sinus tachycardia with nonspecific ST-T wave changes no acute change  Telemetry: Normal sinus rhythm to sinus tachycardia  Labs:  Recent Labs  10/07/16 0700 10/07/16 1246  TROPONINI 0.03* 0.03*   Lab Results  Component Value Date   WBC 15.9 (H) 10/07/2016   HGB 15.8 10/07/2016   HCT 47.4 10/07/2016   MCV 88.1 10/07/2016   PLT 283 10/07/2016    Recent Labs Lab 10/07/16 0700  NA 135  K 3.6  CL 100*  CO2 25  BUN 21*  CREATININE 1.41*  CALCIUM 8.9  PROT 8.1  BILITOT 0.9  ALKPHOS 91  ALT 57  AST 53*  GLUCOSE 406*   Lab Results  Component Value Date   CHOL  165 03/12/2016   HDL 33 (L) 03/12/2016   LDLCALC 70 03/12/2016   TRIG 309 (H) 03/12/2016   Radiology/Studies:  Dg Chest Port 1 View  Result Date: 10/07/2016 CLINICAL DATA:  Shortness of breath. EXAM: PORTABLE CHEST 1 VIEW COMPARISON:  Radiographs of Apr 27, 2016. FINDINGS: Stable cardiomediastinal silhouette. No pneumothorax or pleural effusion is noted. Mild perihilar and basilar interstitial  densities are noted concerning for possible pulmonary edema. Bony thorax is unremarkable. IMPRESSION: Mild perihilar and basilar interstitial densities concerning for possible pulmonary edema. Electronically Signed   By: Marijo Conception, M.D.   On: 10/07/2016 07:41   Cardiac catheterization 05/15/16  Patent stent in the mid circumflex.  Moderate disease in the RCA and LAD.  Mild pulmonary hypertension.  Cardiac output 5.1 L/m. Cardiac index 2.4  Pulmonary artery saturation 66%.   Cardiomyopathy not related to obstructive coronary artery disease. The patient did state that he drinks alcohol heavily. I did explain him the cutting back on his alcohol may help his cardiac function. Follow-up with Dr. Harl Bowie.   2-D echo 10/06/16 Study Conclusions   - Left ventricle: The cavity size was mildly dilated. Wall   thickness was increased in a pattern of mild LVH. Systolic   function was moderately to severely reduced. The estimated   ejection fraction was in the range of 30% to 35%. There is   akinesis of the inferolateral and inferior myocardium. Doppler   parameters are consistent with restrictive physiology, indicative   of decreased left ventricular diastolic compliance and/or   increased left atrial pressure. - Aortic valve: Mildly calcified annulus. Trileaflet. There was   trivial regurgitation. - Mitral valve: Mildly thickened leaflets . There was moderate   regurgitation directed posteriorly. - Left atrium: The atrium was mildly dilated. - Right atrium: Central venous pressure (est): 3 mm  Hg. - Tricuspid valve: There was mild regurgitation. - Pulmonary arteries: PA peak pressure: 36 mm Hg (S). - Pericardium, extracardiac: There was no pericardial effusion.   Impressions:   - Mild LVH with mild LV chamber dilatation and LVEF 30-35%.   Inferior/inferolateral akinesis. Restrictive diastolic filling   pattern noted. Mild left atrial enlargement. Mildly thickened   mitral leaflets with moderate, posteriorly directed mitral   regurgitation. Mildly calcified aortic annulus with trivial   aortic regurgitation. Mild tricuspid regurgitation with PASP 36   mmHg.   Cath 05/2012 PROCEDURAL FINDINGS   Hemodynamics:   AO 119/78   LV 121/71   Coronary angiography:   Coronary dominance: right   Left mainstem: The left main is patent with diffuse nonobstructive plaque. The distal left main has 30% stenosis.   Left anterior descending (LAD): the LAD is patent to the LV apex. The mid LAD has 50-60% stenosis at the origin of the second diagonal branch. The first diagonal is very large in caliber and has no significant obstructive disease.   Left circumflex (LCx): the left circumflex is totally occluded in the mid vessel. There is TIMI 0 flow. Following PCI, there were 2 obtuse marginals and a left posterolateral branch visualized, all of which are patent.   Right coronary artery (RCA): there is a high anterior origin of the RCA. The mid vessel has diffuse plaque estimated at 50-60%. The vessel is dominant. There is diffuse nonobstructive disease throughout. There is a small PDA and small posterolateral branch.   Left ventriculography: Left ventricular systolic function is in the low-normal range, LVEF is estimated at 55%, there is hypokinesis of the basal and midinferior wall.   PCI Note: Following the diagnostic procedure, the decision was made to proceed with PCI. The patient was loaded with Effient 60 mg. Weight-based bivalirudin was given for anticoagulation. Once a therapeutic ACT was  achieved, a 6 [place] XB-LAD guide catheter was inserted. A Cougar coronary guidewire was used to cross the lesion. The lesion was predilated with a 2.5x15 mm balloon. The  lesion was then stented with a 3.5x15 mm Resolute drug-eluting stent. Following PCI, there was 0% residual stenosis and TIMI-3 flow. Final angiography confirmed an excellent result. The patient tolerated the procedure well. There were no immediate procedural complications. A TR band was used for radial hemostasis. The patient was transferred to the post catheterization recovery area for further monitoring.   PCI Data:   Vessel - circumflex /Segment - mid   Percent Stenosis (pre) 100   TIMI-flow 0   Stent 3.5 x 15 mm resolute integrity DES   Percent Stenosis (post) 0   TIMI-flow (post) 3   Final Conclusions:   1. Total occlusion of the left circumflex with successful primary PCI using a drug-eluting stent platform   2. Diffuse nonobstructive disease of the right coronary artery and LAD   3. Mild left ventricular dysfunction consistent with inferior MI   Recommendations:   transfer to ICU. Post MI medical therapy will be instituted. Tobacco cessation counseling will be done. Anticipate 48 hour hospitalization if his post MI course is uncomplicated   PROBLEM LIST:  Principal Problem:   Acute on chronic systolic CHF (congestive heart failure) (HCC) Active Problems:   Diabetes mellitus, insulin dependent (IDDM), uncontrolled (HCC)   NICM (nonischemic cardiomyopathy) (HCC)   Tobacco abuse   Alcohol abuse   Arteriosclerotic cardiovascular disease (ASCVD)   Abnormal echocardiogram   Flash pulmonary edema (HCC)   AKI (acute kidney injury) (HCC)   Prolonged QT interval    ASSESSMENT AND PLAN:  1. Acute on chronic systolic heart failure most likely contributed to by dietary indiscretion, although he has had recurring symptoms of last month to some degree. LVEF 30-35% on echo yesterday. Recommend diuresis. On Lasix 60 mg IV  twice a day. Was on 40 mg daily at home. Will follow with you and adjust medications as able. This point do not suspect ACS.  2. Nonischemic cardiomyopathy felt secondary to excessive alcohol abuse. On Avapro 300 mg daily Aldactone 25 mg daily, Toprol-XL 50 mg daily  3. CAD status post MI and stenting of the RCA in 2013 recent cath revealed patent stent in the mid circumflex and moderate disease in the RCA and LAD. Medical management recommended. No recent angina.  4. COPD/asthma  5. Alcohol abuse still drinking a half a pint daily. Needs to cut back.  6. Hypertension BP high on admission coming down  Sumner Boast PA-C 10/07/2016 4:09 PM    Attending note:  Patient seen and examined. Reviewed records and updated the chart. Discussed case with Ms. Vita Barley, I agree with her findings. Mr. Capan presents with recurring shortness of breath and orthopnea over the last month, worsening episode in the last 24 hours culminating in severe shortness of breath at rest and transported to the ER via EMS. He was found to be significantly hypertensive and in pulmonary edema, ultimately symptoms and to improve after placement on BiPAP, initiation of diuretic, and on IV nitroglycerin. He has not experienced any obvious angina recently or palpitations and reports compliance with his medications. He does admit to recent dietary indiscretion including increased sodium intake. Also continues to drink alcohol although he is trying to cut back. Further states that he has had an intermittent cough, somewhat productive. No fevers or chills.  On my examination this afternoon he looked comfortable, on room air at the time. He stated that his breathing was much better. Systolic blood pressure in the 120-140 range, heart rate in the 90s in sinus rhythm. Lungs  exhibited a few crackles at the very bases, no wheezing. Cardiac exam with RRR and S4. Labwork shows troponin I levels 0.032, BNP 374, creatinine 1.4.  Chest x-ray consistent with pulmonary edema. ECG without acute ST segment abnormalities.  Patient presents with respiratory distress and pulmonary edema, suspect acute on chronic combined heart failure exacerbation, possibly complicated by recent dietary indiscretion, although with some recurring symptoms over the last month. He has a known cardiomyopathy with LVEF 30-35%, also restrictive diastolic filling pattern, and moderate eccentric mitral regurgitation. ACS not suspected at this time. Agree with admission for IV diuresis. We will assist with further medication adjustments as tolerated depending on blood pressure and heart rate trend. May ultimately be a good candidate for Entresto although need to optimize volume status first. Do not anticipate ischemic testing at this time.  Satira Sark, M.D., F.A.C.C.

## 2016-10-07 NOTE — ED Notes (Signed)
Pt worsening respiratory distress.  Diaphoretic.  Called ER Physician and RT to room.

## 2016-10-07 NOTE — H&P (Signed)
History and Physical  Antonio Cobb [id number] DOB: [birth year] DOA: 10/07/2016  PCP: Tula Nakayama, MD  Patient coming from: Home   Chief Complaint: Wheezing and shortness of breath   HPI:  55 year old man with history of nonischemic cardiomyopathy LVEF 30-35 percent, diabetes, smoker, presented with sudden onset wheezing and shortness of breath, initially treated with bronchodilators and steroids. While in the emergency department, respiratory status deteriorated, patient was placed on nitroglycerin infusion, given intravenous Lasix for pulmonary edema and started on BiPAP.  He was feeling well yesterday while working in the yard. He became short of breath last night with poor sleep, associated orthopnea. No chest pain. No arm or neck pain. Intensity is severe; aggravated by exertion and somewhat relieved with rest. He went to work this morning and had to rest 45 minutes after a brief walk. Co-worker checked on him and called 911. Since being treated in the ER he has significantly improved.   ED Course: Treated with steroids, bronchodilators. Been started on BiPAP, given Lasix and started on nitroglycerin infusion. Pertinent labs: Troponin 0.03, BNP 374, currently 1.41 above baseline, CBC with modest leukocytosis 15.9 EKG: Independently reviewed. Sinus tachycardia, prolonged QT. Imaging: Chest x-ray independently reviewed. Pulmonary edema.  Review of Systems:  Negative for fever, visual changes, sore throat, rash, new muscle aches, chest pain, SOB, dysuria, bleeding, n/v/abdominal pain.  Past Medical History:  Diagnosis Date  . Alcohol use   . Diabetes mellitus, type 2 ([place])   . Elevated cholesterol   . Hyperlipidemia   . Hypertension   . Myocardial infarction 05/23/12  . Nicotine addiction   . Obesity   . Other and unspecified alcohol dependence, unspecified drinking behavior     Past Surgical History:  Procedure Laterality Date  . CARDIAC CATHETERIZATION  2 yrs ago    . CARDIAC CATHETERIZATION N/A 05/15/2016   Procedure: Right/Left Heart Cath and Coronary Angiography;  Surgeon: [name], MD;  Location: [facility];  Service: Cardiovascular;  Laterality: N/A;  . CORONARY STENT PLACEMENT  05/23/12  . LEFT HEART CATHETERIZATION WITH CORONARY ANGIOGRAM N/A 05/23/2012   Procedure: LEFT HEART CATHETERIZATION WITH CORONARY ANGIOGRAM;  Surgeon: [name], MD;  Location[facility] C[facility];  Service: Cardiovascular;  Laterality: N/A;  . None    . PERCUTANEOUS CORONARY STENT INTERVENTION (PCI-S) N/A 05/23/2012   Procedure: PERCUTANEOUS CORONARY STENT INTERVENTION (PCI-S);  Surgeon: [name], MD;  Location: [facility] CATH LAB;  Service: Cardiovascular;  Laterality: N/A;  . POLYPECTOMY  10/16/2011   Procedure: POLYPECTOMY;  Surgeon: [name], MD;  Location: [facility];  Service: Endoscopy;;  Polypoid Lesion, Transverse and Sigmoid Colon     reports that he quit smoking about 4 months ago. His smoking use included Cigars. He has a 30.00 pack-year smoking history. He has never used smokeless tobacco. He reports that he drinks alcohol. He reports that he does not use drugs. Ambulatory status: walks  No Known Allergies  Family History  Problem Relation Age of Onset  . Emphysema Mother   . Heart attack Mother   . Hypertension Mother   . Diabetes Mother   . Heart disease Mother   . COPD Mother   . Emphysema Father   . COPD Father   . Stroke Sister   . Asthma Sister   . Colon cancer Neg Hx   . Anesthesia problems Neg Hx   . Hypotension Neg Hx   . Malignant hyperthermia Neg Hx   . Pseudochol deficiency Neg Hx  Prior to Admission medications   Medication Sig Start Date End Date Taking? Authorizing Provider  albuterol (PROVENTIL HFA;VENTOLIN HFA) 108 (90 Base) MCG/ACT inhaler Two puffs before exercise , as needed, for  wheezing Patient taking differently: Inhale 2 puffs into the lungs as needed for wheezing. Two puffs before exercise , as  needed, for  wheezing 03/25/16   [name], MD  amLODipine (NORVASC) 10 MG tablet take 1 tablet once daily 01/13/16   [name], MD  aspirin (ASPIRIN ADULT LOW STRENGTH) 81 MG EC tablet Take 81 mg by mouth daily.      Historical Provider, MD  atorvastatin (LIPITOR) 40 MG tablet Take 1 tablet (40 mg total) by mouth daily. 06/17/16   [name], MD  furosemide (LASIX) 40 MG tablet Take 1 tablet (40 mg total) by mouth daily. 06/17/16   [name], MD  glucose blood test strip Four times daily testing dx [name] 03/12/16   [name], MD  hydrochlorothiazide (HYDRODIURIL) 25 MG tablet take 1 tablet by mouth once daily 05/29/16   Lendon Colonel, NP  insulin aspart (NOVOLOG) 100 UNIT/ML injection Inject 15 Units into the skin 3 (three) times daily before meals. Sliding scale begins at 15 units and increases as CBG dictates Patient taking differently: Inject 11-20 Units into the skin 3 (three) times daily before meals. Sliding scale per home instructions 03/23/15   Orpah Greek, MD  Insulin Glargine (LANTUS SOLOSTAR) 100 UNIT/ML Solostar Pen Inject 60 Units into the skin daily at 10 pm. Everynight    Historical Provider, MD  ipratropium-albuterol (DUONEB) 0.5-2.5 (3) MG/3ML SOLN Take 3 mLs by nebulization every 6 (six) hours as needed. Patient taking differently: Take 3 mLs by nebulization every 6 (six) hours as needed (for wheezing or shortness of breath).  04/27/16   [name], MD  irbesartan (AVAPRO) 300 MG tablet take 1 tablet by mouth once daily 01/13/16   [name], MD  Lancets (FREESTYLE) lancets Four times daily testing dx e11.65 03/12/16   [name], MD  metoprolol succinate (TOPROL-XL) 50 MG 24 hr tablet Take 1 tablet (50 mg total) by mouth daily. Take with or immediately following a meal. 06/29/16   [name], MD  montelukast (SINGULAIR) 10 MG tablet Take 1 tablet (10 mg total) by mouth at bedtime. 03/25/16   [name], MD  nitroGLYCERIN (NITROSTAT) 0.4 MG SL tablet Place 1 tablet (0.4 mg total) under the tongue every 5 (five) minutes as needed for chest pain. 05/29/16 11/06/19  Lendon Colonel, NP  potassium chloride SA (KLOR-CON M20) 20 MEQ tablet Take 1 tablet (20 mEq total) by mouth daily. 05/14/16   Lendon Colonel, NP  SPIRIVA HANDIHALER 18 MCG inhalation capsule inhale the contents of one capsule in the handihaler once daily 04/30/15   [name], MD  spironolactone (ALDACTONE) 25 MG tablet Take 1 tablet (25 mg total) by mouth daily. 06/17/16   [name], MD  temazepam (RESTORIL) 7.5 MG capsule Take 1 capsule (7.5 mg total) by mouth at bedtime as needed for sleep. 06/17/16   [name], MD    Physical Exam: Vitals:   10/07/16 0845 10/07/16 0900 10/07/16 0915 10/07/16 0930  BP: 101/69 100/67 100/61 105/83  Pulse: 95 94 92 95  Resp: 21 21 20 23   SpO2: 100% 100% 100% 100%  Weight:      Height:       Constitutional:  .  Appears ill but not toxic. While ground. On BiPAP, speaks in full sentences.  Eyes:  . PERRL and irises appear normal . Normal conjunctivae and lids ENMT:  . external ears, nose appear normal . grossly normal hearing Neck:  . neck thick but appears normal . no thyromegaly Respiratory:  . Bilateral rales most prominent posteriorly, no wheezing or crackles.  [name] Respiratory effort moderately increased. Able to speak in short sentences. Cardiovascular:  . RRR, no m/r/g . Trace bilateral LE extremity edema   Abdomen:   Abdomen appears soft, non tender, and non distended.  . No hernias Musculoskeletal:  . Moves all extremities  Skin:  . No rashes, lesions, ulcers . palpation of skin: no induration or nodules Neurologic:  . Grossly unremarkable Psychiatric:  . judgement and insight appear normal . Mental status o Orientation to person, place, time   Wt Readings from Last 3 Encounters:  10/07/16 93.4 kg (206 lb)  09/28/16 93.4 kg (206 lb)   06/17/16 94.8 kg (209 lb)    I have personally reviewed following labs and imaging studies  Labs on Admission:  CBC:  Recent Labs Lab 10/07/16 0700  WBC 15.9*  NEUTROABS 8.0*  HGB 15.8  HCT 47.4  MCV 88.1  PLT [id number]   Basic Metabolic Panel:  Recent Labs Lab 10/07/16 0700  NA 135  K 3.6  CL 100*  CO2 25  GLUCOSE 406*  BUN 21*  CREATININE 1.41*  CALCIUM 8.9   Liver Function Tests:  Recent Labs Lab 10/07/16 0700  AST 53*  ALT 57  ALKPHOS 91  BILITOT 0.9  PROT 8.1  ALBUMIN 4.1   Cardiac Enzymes:  Recent Labs Lab 10/07/16 0700  TROPONINI 0.03*   Urine analysis:    Component Value Date/Time   COLORURINE YELLOW 03/23/2015 [place] 03/23/2015 1805   LABSPEC <1.005 (L) 03/23/2015 1805   PHURINE 5.0 03/23/2015 1805   GLUCOSEU >1000 (A) 03/23/2015 1805   GLUCOSEU NEG mg/dL 03/04/2007 0829   HGBUR NEGATIVE 03/23/2015 1805   HGBUR negative 11/13/2010 1522   BILIRUBINUR NEGATIVE 03/23/2015 1805   KETONESUR NEGATIVE 03/23/2015 1805   PROTEINUR NEGATIVE 03/23/2015 1805   UROBILINOGEN 0.2 03/23/2015 1805   NITRITE NEGATIVE 03/23/2015 1805   LEUKOCYTESUR NEGATIVE 03/23/2015 1805     Radiological Exams on Admission: Dg Chest Port 1 View  Result Date: 10/07/2016 CLINICAL DATA:  Shortness of breath. EXAM: PORTABLE CHEST 1 VIEW COMPARISON:  Radiographs of Apr 27, 2016. FINDINGS: Stable cardiomediastinal silhouette. No pneumothorax or pleural effusion is noted. Mild perihilar and basilar interstitial densities are noted concerning for possible pulmonary edema. Bony thorax is unremarkable. IMPRESSION: Mild perihilar and basilar interstitial densities concerning for possible pulmonary edema. Electronically Signed   By: Marijo Conception, M.D.   On: 10/07/2016 07:41    EKG: Independently reviewed. EKG shows sinus tachycardia.   Principal Problem:   Acute on chronic systolic CHF (congestive heart failure) (HCC) Active Problems:   Diabetes  mellitus, insulin dependent (IDDM), uncontrolled (HCC)   NICM (nonischemic cardiomyopathy) (HCC)   Tobacco abuse   Alcohol abuse   Arteriosclerotic cardiovascular disease (ASCVD)   Abnormal echocardiogram   Flash pulmonary edema (HCC)   AKI (acute kidney injury) (HCC)   Prolonged QT interval   Assessment/Plan 1. Acute systolic congestive heart failure with acute hypoxic respiratory failure. BNP relatively unremarkable. No chest pain. Minimal troponin elevation insignificant at this point. ACS felt to be unlikely. Possibly related to salt intake. 2. Acute kidney injury,  likely secondary to relative hypoperfusion. 3. PMH NICM (possible alcoholic), MI, ASCVD, LVEF 30-35%. Left heart cath 05/15/2016 showed moderate disease RCA, LAD, patent circumflex stent. 4. Prolonged QT 5. Diabetes mellitus type 2 with uncontrolled hyperglycemia on admission, 406, anion Normal. 6. Alcohol use disorder. Monitor for withdrawal. 7. Tobacco use disorder 8. Obesity   Admit to stepdown  Continue nitroglycerin infusion, schedule Lasix, trend troponin, follow cardiology recommendations  BMP, EKG in the morning  Aggressive glycemic control  Monitor for withdrawal from alcohol  DVT prophylaxis: SCDs  Code Status: FULL  Family Communication: No family bedside Disposition Plan: Discharge home once improved.  Consults called: cardiology Admission status: Inpatient, stepdown.   It is my clinical opinion that admission to INPATIENT is reasonable and necessary in this 55yom  . presenting with symptoms of SOB, concerning for acute systolic CHF  . in the context of PMH including: systolic CHF, NICM, ASCVD  . with pertinent positives on physical exam including: hypoxia requiring BiPap, rales . and pertinent positives on radiographic and laboratory data including: pulmonary edema, mild troponin elevation . Workup and treatment include aggressive diuresis, NTG infusion, serial troponin.   Given the  aforementioned, the predictability of an adverse outcome is felt to be significant. I expect that the patient will require at least 2 midnights in the hospital to treat this condition.   Time spent: 55 minutes  Murray Hodgkins, MD  [facility] Direct contact: [phone] --[place] [place]  --[email or web address]; password [email or web address]  7PM-7AM contact night coverage as above  10/07/2016, 10:26 AM   By signing my name below, I, Collene Leyden, attest that this documentation has been prepared under the direction and in the presence of Murray Hodgkins, MD. Electronically signed: Collene Leyden, Scribe. 10/07/16 9:20 AM   I personally performed the services described in this documentation. All medical record entries made by the scribe were at my direction. I have reviewed the chart and agree that the record reflects my personal performance and is accurate and complete. Murray Hodgkins, MD

## 2016-10-07 NOTE — ED Notes (Signed)
Pt placed on bipap  

## 2016-10-07 NOTE — ED Provider Notes (Signed)
[facility] DEPT Provider Note   CSN: [id number] Arrival date & time: 10/07/16  0702     History   Chief Complaint Chief Complaint  Patient presents with  . Shortness of Breath    HPI Antonio Cobb is a 55 y.o. male.  55 year old male was found by EMS in respiratory distress in a parking lot. He was noted to have significant wheezing and was given albuterol via nebulizer without any improvement. Patient is not able to speak in full sentences, but states that he has never had to be admitted for wheezing before.   The history is provided by the patient and the EMS personnel. The history is limited by the condition of the patient (Respiratory distress).  Shortness of Breath     Past Medical History:  Diagnosis Date  . Alcohol use   . Diabetes mellitus, type 2 ([place])   . Elevated cholesterol   . Hyperlipidemia   . Hypertension   . Myocardial infarction 05/23/12  . Nicotine addiction   . Obesity   . Other and unspecified alcohol dependence, unspecified drinking behavior     Patient Active Problem List   Diagnosis Date Noted  . Insomnia 06/17/2016  . Abnormal echocardiogram   . Wheezing 04/28/2016  . Acute asthma flare 10/09/2015  . Seasonal allergies 05/06/2015  . Cough 05/10/2013  . NICM (nonischemic cardiomyopathy) ([place]) 05/23/2012  . Tobacco abuse 05/23/2012  . Alcohol abuse 05/23/2012  . Arteriosclerotic cardiovascular disease (ASCVD) 05/23/2012  . ED (erectile dysfunction) 05/10/2012  . Overweight 08/12/2010  . Diabetes mellitus, insulin dependent (IDDM), uncontrolled ([place]) 03/08/2008  . Hyperlipidemia LDL goal <70 03/08/2008  . Essential hypertension 03/08/2008    Past Surgical History:  Procedure Laterality Date  . CARDIAC CATHETERIZATION  2 yrs ago  . CARDIAC CATHETERIZATION N/A 05/15/2016   Procedure: Right/Left Heart Cath and Coronary Angiography;  Surgeon: [name], MD;  Location: [facility] CV LAB;  Service: Cardiovascular;   Laterality: N/A;  . CORONARY STENT PLACEMENT  05/23/12  . LEFT HEART CATHETERIZATION WITH CORONARY ANGIOGRAM N/A 05/23/2012   Procedure: LEFT HEART CATHETERIZATION WITH CORONARY ANGIOGRAM;  Surgeon: [name], MD;  Location: [facility] CATH LAB;  Service: Cardiovascular;  Laterality: N/A;  . None    . PERCUTANEOUS CORONARY STENT INTERVENTION (PCI-S) N/A 05/23/2012   Procedure: PERCUTANEOUS CORONARY STENT INTERVENTION (PCI-S);  Surgeon: [name], MD;  Location: [facility] CATH LAB;  Service: Cardiovascular;  Laterality: N/A;  . POLYPECTOMY  10/16/2011   Procedure: POLYPECTOMY;  Surgeon: [name], MD;  Location: [facility] ORS;  Service: Endoscopy;;  Polypoid Lesion, Transverse and Sigmoid Colon       Home Medications    Prior to Admission medications   Medication Sig Start Date End Date Taking? Authorizing Provider  albuterol (PROVENTIL HFA;VENTOLIN HFA) 108 (90 Base) MCG/ACT inhaler Two puffs before exercise , as needed, for  wheezing Patient taking differently: Inhale 2 puffs into the lungs as needed for wheezing. Two puffs before exercise , as needed, for  wheezing 03/25/16   [name], MD  amLODipine (NORVASC) 10 MG tablet take 1 tablet once daily 01/13/16   [name], MD  aspirin (ASPIRIN ADULT LOW STRENGTH) 81 MG EC tablet Take 81 mg by mouth daily.      Historical Provider, MD  atorvastatin (LIPITOR) 40 MG tablet Take 1 tablet (40 mg total) by mouth daily. 06/17/16   [name], MD  furosemide (LASIX) 40 MG tablet Take 1 tablet (40 mg total) by mouth  daily. 06/17/16   [name], MD  glucose blood test strip Four times daily testing dx [name] 03/12/16   [name], MD  hydrochlorothiazide (HYDRODIURIL) 25 MG tablet take 1 tablet by mouth once daily 05/29/16   Lendon Colonel, NP  insulin aspart (NOVOLOG) 100 UNIT/ML injection Inject 15 Units into the skin 3 (three) times daily before meals. Sliding scale begins at 15 units and increases as CBG  dictates Patient taking differently: Inject 11-20 Units into the skin 3 (three) times daily before meals. Sliding scale per home instructions 03/23/15   Orpah Greek, MD  Insulin Glargine (LANTUS SOLOSTAR) 100 UNIT/ML Solostar Pen Inject 60 Units into the skin daily at 10 pm. Everynight    Historical Provider, MD  ipratropium-albuterol (DUONEB) 0.5-2.5 (3) MG/3ML SOLN Take 3 mLs by nebulization every 6 (six) hours as needed. Patient taking differently: Take 3 mLs by nebulization every 6 (six) hours as needed (for wheezing or shortness of breath).  04/27/16   [name], MD  irbesartan (AVAPRO) 300 MG tablet take 1 tablet by mouth once daily 01/13/16   [name], MD  Lancets (FREESTYLE) lancets Four times daily testing dx e11.65 03/12/16   [name], MD  metoprolol succinate (TOPROL-XL) 50 MG 24 hr tablet Take 1 tablet (50 mg total) by mouth daily. Take with or immediately following a meal. 06/29/16   [name], MD  montelukast (SINGULAIR) 10 MG tablet Take 1 tablet (10 mg total) by mouth at bedtime. 03/25/16   [name], MD  nitroGLYCERIN (NITROSTAT) 0.4 MG SL tablet Place 1 tablet (0.4 mg total) under the tongue every 5 (five) minutes as needed for chest pain. 05/29/16 11/06/19  Lendon Colonel, NP  potassium chloride SA (KLOR-CON M20) 20 MEQ tablet Take 1 tablet (20 mEq total) by mouth daily. 05/14/16   Lendon Colonel, NP  SPIRIVA HANDIHALER 18 MCG inhalation capsule inhale the contents of one capsule in the handihaler once daily 04/30/15   [name], MD  spironolactone (ALDACTONE) 25 MG tablet Take 1 tablet (25 mg total) by mouth daily. 06/17/16   [name], MD  temazepam (RESTORIL) 7.5 MG capsule Take 1 capsule (7.5 mg total) by mouth at bedtime as needed for sleep. 06/17/16   [name], MD    Family History Family History  Problem Relation Age of Onset  . Emphysema Mother   . Heart attack Mother   . Hypertension Mother    . Diabetes Mother   . Heart disease Mother   . COPD Mother   . Emphysema Father   . COPD Father   . Stroke Sister   . Asthma Sister   . Colon cancer Neg Hx   . Anesthesia problems Neg Hx   . Hypotension Neg Hx   . Malignant hyperthermia Neg Hx   . Pseudochol deficiency Neg Hx     Social History Social History  Substance Use Topics  . Smoking status: Former Smoker    Packs/day: 1.00    Years: 30.00    Types: Cigars    Quit date: 05/23/2016  . Smokeless tobacco: Never Used     Comment: since early 73s  . Alcohol use 0.0 oz/week     Comment: pint will last 3 days     Allergies   Review of patient's allergies indicates no known allergies.   Review of Systems Review of Systems  Unable to perform ROS: Severe respiratory distress  Respiratory:  Positive for shortness of breath.      Physical Exam Updated Vital Signs BP (!) 161/134 (BP Location: Left Arm)   Pulse 107   Resp (!) 32   Ht 5\' 10"  (1.778 m)   Wt 206 lb (93.4 kg)   SpO2 100%   BMI 29.56 kg/m   Physical Exam  Nursing note and vitals reviewed.  55 year old male, In moderate to severe respiratory distress. Vital signs are significant for hypertension, tachycardia, tachypnea. Oxygen saturation is 100%, which is normal. Head is normocephalic and atraumatic. PERRLA, EOMI. Oropharynx is clear. Neck is nontender and supple without adenopathy or JVD. Back is nontender and there is no CVA tenderness. Lungs have diffuse inspiratory and expiratory wheezes without rales or rhonchi. Chest is nontender. Heart has regular rate and rhythm without murmur. Abdomen is soft, flat, nontender without masses or hepatosplenomegaly and peristalsis is normoactive. Extremities have no cyanosis or edema, full range of motion is present. Skin is mildly diaphoretic without rash. Neurologic: Mental status is normal, cranial nerves are intact, there are no motor or sensory deficits.  ED Treatments / Results  Labs (all labs  ordered are listed, but only abnormal results are displayed) Labs Reviewed  COMPREHENSIVE METABOLIC PANEL - Abnormal; Notable for the following:       Result Value   Chloride 100 (*)    Glucose, Bld 406 (*)    BUN 21 (*)    Creatinine, Ser 1.41 (*)    AST 53 (*)    GFR calc non Af Amer 54 (*)    All other components within normal limits  CBC WITH DIFFERENTIAL/PLATELET - Abnormal; Notable for the following:    WBC 15.9 (*)    Neutro Abs 8.0 (*)    Lymphs Abs 5.2 (*)    Monocytes Absolute 1.1 (*)    Eosinophils Absolute 1.4 (*)    Basophils Absolute 0.2 (*)    All other components within normal limits  TROPONIN I - Abnormal; Notable for the following:    Troponin I 0.03 (*)    All other components within normal limits  BRAIN NATRIURETIC PEPTIDE - Abnormal; Notable for the following:    B Natriuretic Peptide 374.0 (*)    All other components within normal limits  TROPONIN I - Abnormal; Notable for the following:    Troponin I 0.03 (*)    All other components within normal limits  TROPONIN I - Abnormal; Notable for the following:    Troponin I 0.03 (*)    All other components within normal limits  GLUCOSE, CAPILLARY - Abnormal; Notable for the following:    Glucose-Capillary 366 (*)    All other components within normal limits  GLUCOSE, CAPILLARY - Abnormal; Notable for the following:    Glucose-Capillary 411 (*)    All other components within normal limits  GLUCOSE, CAPILLARY - Abnormal; Notable for the following:    Glucose-Capillary 296 (*)    All other components within normal limits  MRSA PCR SCREENING  TROPONIN I  BASIC METABOLIC PANEL    EKG  EKG Interpretation  Date/Time:  Wednesday October 07 2016 07:09:54 EDT Ventricular Rate:  106 PR Interval:    QRS Duration: 96 QT Interval:  379 QTC Calculation: 504 R Axis:   45 Text Interpretation:  Sinus tachycardia Probable left atrial enlargement Borderline repol abnrm, inferolateral leads Prolonged QT interval When  compared with ECG of 04/27/2016, No significant change was found Confirmed by [facility]  MD, Kerney Hopfensperger ([id number]) on 10/07/2016 7:13:37  AM       Radiology Dg Chest Port 1 View  Result Date: 10/07/2016 CLINICAL DATA:  Shortness of breath. EXAM: PORTABLE CHEST 1 VIEW COMPARISON:  Radiographs of Apr 27, 2016. FINDINGS: Stable cardiomediastinal silhouette. No pneumothorax or pleural effusion is noted. Mild perihilar and basilar interstitial densities are noted concerning for possible pulmonary edema. Bony thorax is unremarkable. IMPRESSION: Mild perihilar and basilar interstitial densities concerning for possible pulmonary edema. Electronically Signed   By: Marijo Conception, M.D.   On: 10/07/2016 07:41    Procedures Procedures (including critical care time) CRITICAL CARE Performed by: [id number] Total critical care time: 32 minutes Critical care time was exclusive of separately billable procedures and treating other patients. Critical care was necessary to treat or prevent imminent or life-threatening deterioration. Critical care was time spent personally by me on the following activities: development of treatment plan with patient and/or surrogate as well as nursing, discussions with consultants, evaluation of patient's response to treatment, examination of patient, obtaining history from patient or surrogate, ordering and performing treatments and interventions, ordering and review of laboratory studies, ordering and review of radiographic studies, pulse oximetry and re-evaluation of patient's condition.  Medications Ordered in ED Medications  magnesium sulfate IVPB 2 g 50 mL (2 g Intravenous New Bag/Given 10/07/16 0709)  furosemide (LASIX) injection 40 mg (not administered)  nitroGLYCERIN 50 mg in dextrose 5 % 250 mL (0.2 mg/mL) infusion (not administered)  nitroGLYCERIN (NITROSTAT) 0.4 MG SL tablet (not administered)  nitroGLYCERIN 0.2 mg/mL in dextrose 5 % infusion (not administered)  nitroGLYCERIN  (NITROSTAT) SL tablet 0.4 mg (0.4 mg Sublingual Given 10/07/16 0724)  albuterol (PROVENTIL,VENTOLIN) solution continuous neb (15 mg/hr Nebulization Given 10/07/16 0709)  ipratropium (ATROVENT) nebulizer solution 0.5 mg (0.5 mg Nebulization Given 10/07/16 0710)  methylPREDNISolone sodium succinate (SOLU-MEDROL) 125 mg/2 mL injection 125 mg (125 mg Intravenous Given 10/07/16 0708)  nitroGLYCERIN 50 mg in dextrose 5 % 250 mL (0.2 mg/mL) infusion (200 mcg/min Intravenous New Bag/Given 10/07/16 0731)     Initial Impression / Assessment and Plan / ED Course  I have reviewed the triage vital signs and the nursing notes.  Pertinent labs & imaging results that were available during my care of the patient were reviewed by me and considered in my medical decision making (see chart for details).  Clinical Course   Severe wheezing which sounds like asthma. Review of old records shows that he does have a nonischemic cardiomyopathy. Exam does not appear typical for CHF, but will check chest x-ray, BNP, troponin. Echocardiogram done yesterday showed ejection fraction of 30-35%. ECG shows no change from prior. He is started on continuous nebulizer treatment with albuterol and ipratropium and is given methylprednisolone and magnesium intravenously.  7:32 AM Respiratory status deteriorated. On reexam, lungs now have no wheezing but significant rales are heard. Portable chest x-ray seems more consistent with pulmonary edema. He is placed on nitroglycerin drip and given intravenous furosemide. He is also placed on BiPAP. Dr. Oleta Mouse is here and is assuming patient's care at this point.  Final Clinical Impressions(s) / ED Diagnoses   Final diagnoses:  Acute pulmonary edema [facility])    New Prescriptions New Prescriptions   No medications on file     Delora Fuel, MD [id number]

## 2016-10-07 NOTE — ED Notes (Signed)
ntg down to 174mcg due to bp 110/72. Pt talking complete sentences now. Laughing at times

## 2016-10-07 NOTE — ED Provider Notes (Signed)
Please see previous physicians note regarding patient's presenting history and physical, initial ED course, and associated medical decision making.  55 year old male who presents with sudden onset SOB. History of CAD s/p stent to LCx, CHF EF 30-35%, with LHC 05/15/2016 showing patent mLCx stent and moderate disease in RCA and LAD. Treated as potential asthma with solumedrol, continuous albuterol and magnesium. On my evaluation, patient diaphoretic, with increased work of breathing, accessory muscle usage. Unable to speak. Appear rhonchorous on exam. Is hypertensive. Concern for pulmonary edema. CXR visualized and consistent with pulmonary edema.   He is placed on BiPAP, given 3 SL nitroglycerin, placed on nitro gtt. With subsequent significantly improved work of breathing. Able to speak, breathing comfortably. EKG is not ischemic acutely. Troponin 0.03. BNP 300s. Also received 40 mg lasix and full dose ASA. Will discuss with cardiology.  Spoke with Dr. Domenic Polite. Agree to continue to treat at hypertensive emergency and flash pulmonary edema. Does not feel patient requires admission at this time to [facility] for any intervention. Patient discussed with Dr. Sarajane Jews subsequently and admitted to [facility].  CRITICAL CARE Performed by: Forde Dandy   Total critical care time: 35 minutes  Critical care time was exclusive of separately billable procedures and treating other patients.  Critical care was necessary to treat or prevent imminent or life-threatening deterioration.  Critical care was time spent personally by me on the following activities: development of treatment plan with patient and/or surrogate as well as nursing, discussions with consultants, evaluation of patient's response to treatment, examination of patient, obtaining history from patient or surrogate, ordering and performing treatments and interventions, ordering and review of laboratory studies, ordering and review of radiographic  studies, pulse oximetry and re-evaluation of patient's condition.    Forde Dandy, MD 10/07/16 [phone]

## 2016-10-07 NOTE — ED Triage Notes (Signed)
Pt brought in by [facility] for c/o sob; pt was found in parking lot by ems with sob and difficulty breathing;  Pt tachypnic and unable to speak full sentences

## 2016-10-07 NOTE — ED Notes (Signed)
Wife at bedsdie and updated by edp.

## 2016-10-08 DIAGNOSIS — J9602 Acute respiratory failure with hypercapnia: Secondary | ICD-10-CM

## 2016-10-08 DIAGNOSIS — J9601 Acute respiratory failure with hypoxia: Secondary | ICD-10-CM

## 2016-10-08 DIAGNOSIS — I5043 Acute on chronic combined systolic (congestive) and diastolic (congestive) heart failure: Secondary | ICD-10-CM

## 2016-10-08 DIAGNOSIS — F1010 Alcohol abuse, uncomplicated: Secondary | ICD-10-CM

## 2016-10-08 LAB — GLUCOSE, CAPILLARY
GLUCOSE-CAPILLARY: 257 mg/dL — AB (ref 65–99)
GLUCOSE-CAPILLARY: 314 mg/dL — AB (ref 65–99)
Glucose-Capillary: 188 mg/dL — ABNORMAL HIGH (ref 65–99)

## 2016-10-08 LAB — BASIC METABOLIC PANEL
Anion gap: 7 (ref 5–15)
BUN: 28 mg/dL — AB (ref 6–20)
CALCIUM: 8.8 mg/dL — AB (ref 8.9–10.3)
CO2: 27 mmol/L (ref 22–32)
CREATININE: 1.18 mg/dL (ref 0.61–1.24)
Chloride: 103 mmol/L (ref 101–111)
Glucose, Bld: 280 mg/dL — ABNORMAL HIGH (ref 65–99)
Potassium: 3.7 mmol/L (ref 3.5–5.1)
SODIUM: 137 mmol/L (ref 135–145)

## 2016-10-08 LAB — TROPONIN I

## 2016-10-08 MED ORDER — SACUBITRIL-VALSARTAN 49-51 MG PO TABS
1.0000 | ORAL_TABLET | Freq: Two times a day (BID) | ORAL | Status: DC
  Administered 2016-10-08 – 2016-10-09 (×3): 1 via ORAL
  Filled 2016-10-08 (×9): qty 1

## 2016-10-08 MED ORDER — FUROSEMIDE 40 MG PO TABS
40.0000 mg | ORAL_TABLET | Freq: Every day | ORAL | Status: DC
  Administered 2016-10-08 – 2016-10-09 (×2): 40 mg via ORAL
  Filled 2016-10-08 (×2): qty 1

## 2016-10-08 MED ORDER — METOPROLOL SUCCINATE ER 25 MG PO TB24
25.0000 mg | ORAL_TABLET | Freq: Every day | ORAL | Status: DC
  Administered 2016-10-08 – 2016-10-09 (×2): 25 mg via ORAL
  Filled 2016-10-08 (×2): qty 1

## 2016-10-08 NOTE — Progress Notes (Signed)
PROGRESS NOTE  Antonio Cobb [id number] DOB: 1961-06-02 DOA: 10/07/2016 PCP: Tula Nakayama, MD  Brief Narrative: 55 year old man with history of nonischemic cardiomyopathy LVEF 30-35 percent, diabetes, smoker, presented with sudden onset wheezing and shortness of breath, initially treated with bronchodilators and steroids. While in the emergency department, respiratory status deteriorated, patient was placed on nitroglycerin infusion, given intravenous Lasix for pulmonary edema and started on BiPAP.  Assessment/Plan: 1. Acute systolic congestive heart failure, much improved. Cardiology following. Possibly secondary to dietary indiscretion. No evidence of ACS. 2. Acute kidney injury secondary to relative hypoperfusion, resolved. 3. PMH NICM(possible alcoholic), MI, ASCVD, LVEF 30-35%. Left heart cath 05/15/2016 showed moderate disease RCA, LAD, patent circumflex stent 4. Prolonged QT resolved. EKG no acute changes. Diabetes mellitus type 2 with hyperglycemia, control improved.  5. Alcohol use disorder, no evidence of withdrawal 6. Tobacco use disorder 7. Obesity   Overall improved. Still some rails.   Medication adjustment per cardiology including changed from Avapro to [facility]; resume Toprol-XL. Change to oral Lasix.  Basic metabolic panel.  Transfer to medical floor   Likely discharge on 10/27.  DVT prophylaxis: Lovenox Code Status: full code Family Communication: wife at bedside Disposition Plan: home  Murray Hodgkins, MD  [facility] Direct contact: [phone] --[place]ia [place]  --[email or web address]; password [email or web address]  7PM-7AM contact night coverage as above 10/08/2016, 11:46 AM  LOS: 1 day   Consultants:  Cardiology   Procedures:    Antimicrobials:    CC: f/u CHF  Interval history/Subjective: Much improved today. Breathing better. No pain.   Objective: Vitals:   10/08/16 0000 10/08/16 0500 10/08/16 0805 10/08/16 0834  BP:      Pulse:       Resp:      Temp: 98.6 F (37 C) 97.3 F (36.3 C) 97.6 F (36.4 C)   TempSrc:   Oral   SpO2:    96%  Weight:  94.1 kg (207 lb 7.3 oz)    Height:        Intake/Output Summary (Last 24 hours) at 10/08/16 1146 Last data filed at 10/08/16 1053  Gross per 24 hour  Intake              243 ml  Output             3475 ml  Net            -3232 ml     Filed Weights   10/07/16 0711 10/07/16 1139 10/08/16 0500  Weight: 93.4 kg (206 lb) 95.4 kg (210 lb 5.1 oz) 94.1 kg (207 lb 7.3 oz)    Exam:    Constitutional:  . Appears calm and comfortable Respiratory:  . Few rails, no rhonchi or wheezes. [name] Respiratory effort normal. No retractions or accessory muscle use Cardiovascular:  . RRR, no m/r/g . No LE extremity edema   . Telemetry SR Abdomen:  . Abdomen appears normal; no tenderness or masses . No hernias . No HSM Psychiatric:  . judgement and insight appear normal . Mental status o Mood, affect appropriate  I have personally reviewed following labs and imaging studies:  Urine output 3575  Creatinine improved, 1.18. Basic metabolic panel unremarkable.  Troponins flat.  Scheduled Meds: . amLODipine  10 mg Oral Daily  . aspirin EC  81 mg Oral Daily  . atorvastatin  40 mg Oral q1800  . enoxaparin (LOVENOX) injection  40 mg Subcutaneous Q24H  . folic acid  1 mg Oral Daily  . furosemide  40 mg Oral Daily  . insulin aspart  0-20 Units Subcutaneous TID WC  . insulin aspart  0-5 Units Subcutaneous QHS  . insulin glargine  60 Units Subcutaneous Q2200  . metoprolol succinate  25 mg Oral Daily  . montelukast  10 mg Oral QHS  . multivitamin with minerals  1 tablet Oral Daily  . nitroGLYCERIN  5-200 mcg/min Intravenous Once  . potassium chloride SA  20 mEq Oral Daily  . sacubitril-valsartan  1 tablet Oral BID  . sodium chloride flush  3 mL Intravenous Q12H  . spironolactone  25 mg Oral Daily  . thiamine  100 mg Oral Daily   Or  . thiamine  100 mg Intravenous Daily  .  tiotropium  18 mcg Inhalation Daily   Continuous Infusions:   Principal Problem:   Acute on chronic combined systolic and diastolic heart failure (HCC) Active Problems:   Diabetes mellitus, insulin dependent (IDDM), uncontrolled ([place])   NICM (nonischemic cardiomyopathy) (HCC)   Tobacco abuse   Alcohol abuse   Arteriosclerotic cardiovascular disease (ASCVD)   Acute pulmonary edema (HCC)   AKI (acute kidney injury) ([place])   LOS: 1 day

## 2016-10-08 NOTE — Progress Notes (Signed)
Patient Name: Antonio Cobb Date of Encounter: 10/08/2016  Primary Cardiologist: Dr. Raeford Razor Problem List     Principal Problem:   Acute on chronic combined systolic and diastolic heart failure (HCC) Active Problems:   Diabetes mellitus, insulin dependent (IDDM), uncontrolled (HCC)   NICM (nonischemic cardiomyopathy) ([place])   Tobacco abuse   Alcohol abuse   Arteriosclerotic cardiovascular disease (ASCVD)   Acute pulmonary edema (HCC)   AKI (acute kidney injury) ([place])    Subjective   Patient feels much better today. No chest pain or shortness of breath at rest. Eating breakfast with good appetite.  Inpatient Medications    Scheduled Meds: . amLODipine  10 mg Oral Daily  . aspirin EC  81 mg Oral Daily  . atorvastatin  40 mg Oral q1800  . enoxaparin (LOVENOX) injection  40 mg Subcutaneous Q24H  . folic acid  1 mg Oral Daily  . furosemide  60 mg Intravenous Q12H  . Influenza vac split quadrivalent PF  0.5 mL Intramuscular Tomorrow-1000  . insulin aspart  0-20 Units Subcutaneous TID WC  . insulin aspart  0-5 Units Subcutaneous QHS  . insulin glargine  60 Units Subcutaneous Q2200  . irbesartan  300 mg Oral Daily  . montelukast  10 mg Oral QHS  . multivitamin with minerals  1 tablet Oral Daily  . nitroGLYCERIN  5-200 mcg/min Intravenous Once  . pneumococcal 23 valent vaccine  0.5 mL Intramuscular Tomorrow-1000  . potassium chloride SA  20 mEq Oral Daily  . sodium chloride flush  3 mL Intravenous Q12H  . spironolactone  25 mg Oral Daily  . thiamine  100 mg Oral Daily   Or  . thiamine  100 mg Intravenous Daily  . tiotropium  18 mcg Inhalation Daily     PRN Meds: sodium chloride, acetaminophen, ipratropium-albuterol, LORazepam **OR** LORazepam, nitroGLYCERIN, ondansetron (ZOFRAN) IV, sodium chloride flush, temazepam   Vital Signs    Vitals:   10/07/16 2000 10/08/16 0000 10/08/16 0500 10/08/16 0805  BP:      Pulse:      Resp:      Temp: 98.6  F (37 C) 98.6 F (37 C) 97.3 F (36.3 C) 97.6 F (36.4 C)  TempSrc: Oral   Oral  SpO2:      Weight:   207 lb 7.3 oz (94.1 kg)   Height:        Intake/Output Summary (Last 24 hours) at 10/08/16 0814 Last data filed at 10/08/16 [id number]  Gross per 24 hour  Intake              243 ml  Output             3575 ml  Net            -3332 ml   Filed Weights   10/07/16 0711 10/07/16 1139 10/08/16 0500  Weight: 206 lb (93.4 kg) 210 lb 5.1 oz (95.4 kg) 207 lb 7.3 oz (94.1 kg)    Physical Exam    Gen.: Patient appears comfortable at rest. Neck: Supple, no elevated JVP or carotid bruits, no thyromegaly. Lungs: Clear to auscultation, nonlabored breathing at rest. Cardiac: Regular rate and rhythm, S4, soft apical systolic murmur, no pericardial rub. Abdomen: Soft, nontender, bowel sounds present, no guarding or rebound. Extremities: Trace lower leg edema, distal pulses 2+.  Labs    CBC  Recent Labs  10/07/16 0700  WBC 15.9*  NEUTROABS 8.0*  HGB 15.8  HCT 47.4  MCV  88.1  PLT Q000111Q   Basic Metabolic Panel  Recent Labs  10/07/16 0700 10/08/16 0533  NA 135 137  K 3.6 3.7  CL 100* 103  CO2 25 27  GLUCOSE 406* 280*  BUN 21* 28*  CREATININE 1.41* 1.18  CALCIUM 8.9 8.8*   Liver Function Tests  Recent Labs  10/07/16 0700  AST 53*  ALT 57  ALKPHOS 91  BILITOT 0.9  PROT 8.1  ALBUMIN 4.1   Cardiac Enzymes  Recent Labs  10/07/16 1246 10/07/16 1921 10/08/16 0035  TROPONINI 0.03* 0.03* <0.03    Telemetry    I personally reviewed telemetry monitoring over the last 24 hours, patient in sinus rhythm.  ECG    I personally reviewed the tracing from 10/08/2016 which shows sinus rhythm with left atrial enlargement and nonspecific ST-T changes.  Radiology    Dg Chest Port 1 View  Result Date: 10/07/2016 CLINICAL DATA:  Shortness of breath. EXAM: PORTABLE CHEST 1 VIEW COMPARISON:  Radiographs of Apr 27, 2016. FINDINGS: Stable cardiomediastinal silhouette. No  pneumothorax or pleural effusion is noted. Mild perihilar and basilar interstitial densities are noted concerning for possible pulmonary edema. Bony thorax is unremarkable. IMPRESSION: Mild perihilar and basilar interstitial densities concerning for possible pulmonary edema. Electronically Signed   By: Marijo Conception, M.D.   On: 10/07/2016 07:41    Cardiac Studies   Echocardiogram 10/06/2016: Study Conclusions  - Left ventricle: The cavity size was mildly dilated. Wall   thickness was increased in a pattern of mild LVH. Systolic   function was moderately to severely reduced. The estimated   ejection fraction was in the range of 30% to 35%. There is   akinesis of the inferolateral and inferior myocardium. Doppler   parameters are consistent with restrictive physiology, indicative   of decreased left ventricular diastolic compliance and/or   increased left atrial pressure. - Aortic valve: Mildly calcified annulus. Trileaflet. There was   trivial regurgitation. - Mitral valve: Mildly thickened leaflets . There was moderate   regurgitation directed posteriorly. - Left atrium: The atrium was mildly dilated. - Right atrium: Central venous pressure (est): 3 mm Hg. - Tricuspid valve: There was mild regurgitation. - Pulmonary arteries: PA peak pressure: 36 mm Hg (S). - Pericardium, extracardiac: There was no pericardial effusion.  Impressions:  - Mild LVH with mild LV chamber dilatation and LVEF 30-35%.   Inferior/inferolateral akinesis. Restrictive diastolic filling   pattern noted. Mild left atrial enlargement. Mildly thickened   mitral leaflets with moderate, posteriorly directed mitral   regurgitation. Mildly calcified aortic annulus with trivial   aortic regurgitation. Mild tricuspid regurgitation with PASP 36   mmHg.  Patient Profile     55 year old male with CAD status post DES to the circumflex in June 2013 (patent at angiography in June of this year with otherwise moderate  RCA and LAD disease managed medically), nonischemic cardiomyopathy with recently documented LVEF 30-35% and restrictive diastolic filling pattern, moderate mitral regurgitation, alcohol abuse, essential hypertension, and hyperlipidemia. He presented with acute respiratory failure associated with flash pulmonary edema and volume overload, no definite ACS.  Assessment & Plan    1. Acute on chronic combined heart failure. Clinically improving with diuresis and blood pressure control. He has had approximately 3300 cc out in the last 24 hours on IV Lasix.  2. Nonischemic cardiomyopathy with LVEF 30-35% and restrictive diastolic filling pattern. Recent medical regimen includes Aldactone, Lasix, KCl, Norvasc, Toprol-XL and Avapro.  3. CAD status post DES to the circumflex  in 2013, patent at a recent angiography with moderate RCA and LAD disease. No active angina symptoms or clear evidence of ACS. He is on aspirin and statin therapy.  4. Transient acute renal insufficiency, creatinine improved to 1.12.  5. History of alcohol abuse, cessation discussed and patient trying to cut back.  6. Essential hypertension, blood pressure trend is better.  Patient can be transferred out to telemetry today. Medication adjustments are being made, plan to switch from Avapro to [facility], resume Toprol-XL at 25 mg daily. Also change from IV Lasix to oral regimen. Would have him ambulate today, follow-up BMET tomorrow with anticipated discharge and close office follow-up.  Signed, Satira Sark, M.D., F.A.C.C.  10/08/2016, 8:14 AM

## 2016-10-08 NOTE — Progress Notes (Signed)
Inpatient Diabetes Program Recommendations  AACE/ADA: New Consensus Statement on Inpatient Glycemic Control (2015)  Target Ranges:  Prepandial:   less than 140 mg/dL      Peak postprandial:   less than 180 mg/dL (1-2 hours)      Critically ill patients:  140 - 180 mg/dL  Results for TRAYCEN, CIESLIK (MRN [id number]) as of 10/08/2016 08:08  Ref. Range 10/07/2016 12:13 10/07/2016 16:25 10/07/2016 21:09 10/08/2016 07:31  Glucose-Capillary Latest Ref Range: 65 - 99 mg/dL 366 (H) 411 (H) 296 (H) 257 (H)   Review of Glycemic Control  Diabetes history: DM2 Outpatient Diabetes medications: Lantus 60 units QHS, Novolog 11-20 units TID with meals Current orders for Inpatient glycemic control: Lantus 60 units QHS, Novolog 0-20 units TID with meals, Novolog 0-5 units QHS  Inpatient Diabetes Program Recommendations: Insulin - Meal Coverage: If patient is eating at least 50% of meals, please consider ordering Novolog 6 units TID with meals for meal coverage.  NOTE: In reviewing chart, noted patient received one time dose of Solumedrol 125 mg on 10/07/16 which is contributing to hyperglycemia.   Thanks, Barnie Alderman, RN, MSN, CDE Diabetes Coordinator Inpatient Diabetes Program [phone] (Team Pager from 8am to 5pm)

## 2016-10-08 NOTE — Care Management Note (Signed)
Case Management Note  Patient Details  Name: Antonio Cobb MRN: [id number] Date of Birth: 09/21/1961  Subjective/Objective:                  Pt admitted with CHF. He is from home, lives with his wife, he is ind with ADL's, he is employed full time and has insurance with drug coverage. He has PCP, transportation to appointments and no difficulty affording his medications. He plans to return home with self care. Pt will be started on Entresto - voucher for $10 co-pay given to pt.   Action/Plan: No further needs anticipated.   Expected Discharge Date:    10/09/2016              Expected Discharge Plan:  Home/Self Care  In-House Referral:  NA  Discharge planning Services  CM Consult  Post Acute Care Choice:  NA Choice offered to:  NA Status of Service:  Completed, signed off  Sherald Barge, RN 10/08/2016, 11:23 AM

## 2016-10-09 LAB — BASIC METABOLIC PANEL
ANION GAP: 6 (ref 5–15)
BUN: 24 mg/dL — ABNORMAL HIGH (ref 6–20)
CALCIUM: 8.7 mg/dL — AB (ref 8.9–10.3)
CHLORIDE: 104 mmol/L (ref 101–111)
CO2: 26 mmol/L (ref 22–32)
Creatinine, Ser: 1 mg/dL (ref 0.61–1.24)
GFR calc Af Amer: >60 mL/min (ref >60)
GFR calc non Af Amer: >60 mL/min (ref >60)
GLUCOSE: 167 mg/dL — AB (ref 65–99)
Potassium: 3.8 mmol/L (ref 3.5–5.1)
Sodium: 136 mmol/L (ref 135–145)

## 2016-10-09 LAB — GLUCOSE, CAPILLARY
Glucose-Capillary: 162 mg/dL — ABNORMAL HIGH (ref 65–99)
Glucose-Capillary: 171 mg/dL — ABNORMAL HIGH (ref 65–99)

## 2016-10-09 MED ORDER — SACUBITRIL-VALSARTAN 49-51 MG PO TABS: 1.0000 | ORAL_TABLET | Freq: Two times a day (BID) | ORAL | 0 refills | Status: DC

## 2016-10-09 MED ORDER — ORAL CARE MOUTH RINSE: 15.0000 mL | Freq: Two times a day (BID) | OROMUCOSAL | Status: DC

## 2016-10-09 NOTE — Progress Notes (Signed)
Patient Name: Antonio Cobb Date of Encounter: 10/09/2016  Primary Cardiologist: Dr. Raeford Razor Problem List     Principal Problem:   Acute on chronic combined systolic and diastolic heart failure (HCC) Active Problems:   Diabetes mellitus, insulin dependent (IDDM), uncontrolled (HCC)   NICM (nonischemic cardiomyopathy) (HCC)   Tobacco abuse   Alcohol abuse   Arteriosclerotic cardiovascular disease (ASCVD)   Acute pulmonary edema (HCC)   AKI (acute kidney injury) ([place])   Acute respiratory failure with hypoxia ([place])    Subjective   Continues to do well, no dyspnea at rest or chest pain.  Inpatient Medications    Scheduled Meds: . amLODipine  10 mg Oral Daily  . aspirin EC  81 mg Oral Daily  . atorvastatin  40 mg Oral q1800  . enoxaparin (LOVENOX) injection  40 mg Subcutaneous Q24H  . folic acid  1 mg Oral Daily  . furosemide  40 mg Oral Daily  . insulin aspart  0-20 Units Subcutaneous TID WC  . insulin aspart  0-5 Units Subcutaneous QHS  . insulin glargine  60 Units Subcutaneous Q2200  . mouth rinse  15 mL Mouth Rinse BID  . metoprolol succinate  25 mg Oral Daily  . montelukast  10 mg Oral QHS  . multivitamin with minerals  1 tablet Oral Daily  . nitroGLYCERIN  5-200 mcg/min Intravenous Once  . potassium chloride SA  20 mEq Oral Daily  . sacubitril-valsartan  1 tablet Oral BID  . sodium chloride flush  3 mL Intravenous Q12H  . spironolactone  25 mg Oral Daily  . thiamine  100 mg Oral Daily   Or  . thiamine  100 mg Intravenous Daily  . tiotropium  18 mcg Inhalation Daily     PRN Meds: sodium chloride, acetaminophen, ipratropium-albuterol, LORazepam **OR** LORazepam, nitroGLYCERIN, ondansetron (ZOFRAN) IV, sodium chloride flush, temazepam   Vital Signs    Vitals:   10/08/16 1900 10/08/16 2000 10/09/16 0000 10/09/16 0400  BP:  (!) 112/92    Pulse: 91     Resp: (!) 25     Temp:  98 F (36.7 C) 98.5 F (36.9 C) 97.4 F (36.3 C)    TempSrc:  Oral Oral   SpO2: 97%     Weight:    205 lb 4 oz (93.1 kg)  Height:        Intake/Output Summary (Last 24 hours) at 10/09/16 0754 Last data filed at 10/09/16 0526  Gross per 24 hour  Intake              960 ml  Output             2350 ml  Net            -1390 ml   Filed Weights   10/07/16 1139 10/08/16 0500 10/09/16 0400  Weight: 210 lb 5.1 oz (95.4 kg) 207 lb 7.3 oz (94.1 kg) 205 lb 4 oz (93.1 kg)    Physical Exam    Gen.: Patient appears comfortable at rest. Neck: Supple, no elevated JVP or carotid bruits, no thyromegaly. Lungs: Clear to auscultation, nonlabored breathing at rest. Cardiac: Regular rate and rhythm, S4, soft apical systolic murmur, no pericardial rub. Abdomen: Soft, nontender, bowel sounds present, no guarding or rebound. Extremities: Trace lower leg edema, distal pulses 2+.  Labs    CBC  Recent Labs  10/07/16 0700  WBC 15.9*  NEUTROABS 8.0*  HGB 15.8  HCT 47.4  MCV 88.1  PLT Q000111Q   Basic Metabolic Panel  Recent Labs  10/08/16 0533 10/09/16 0446  NA 137 136  K 3.7 3.8  CL 103 104  CO2 27 26  GLUCOSE 280* 167*  BUN 28* 24*  CREATININE 1.18 1.00  CALCIUM 8.8* 8.7*   Liver Function Tests  Recent Labs  10/07/16 0700  AST 53*  ALT 57  ALKPHOS 91  BILITOT 0.9  PROT 8.1  ALBUMIN 4.1   Cardiac Enzymes  Recent Labs  10/07/16 1246 10/07/16 1921 10/08/16 0035  TROPONINI 0.03* 0.03* <0.03    Telemetry    I personally reviewed telemetry monitoring over the last 24 hours, patient in sinus rhythm.  ECG    I personally reviewed the tracing from 10/08/2016 which shows sinus rhythm with left atrial enlargement and nonspecific ST-T changes.  Radiology    No results found.  Cardiac Studies   Echocardiogram 10/06/2016: Study Conclusions  - Left ventricle: The cavity size was mildly dilated. Wall   thickness was increased in a pattern of mild LVH. Systolic   function was moderately to severely reduced. The  estimated   ejection fraction was in the range of 30% to 35%. There is   akinesis of the inferolateral and inferior myocardium. Doppler   parameters are consistent with restrictive physiology, indicative   of decreased left ventricular diastolic compliance and/or   increased left atrial pressure. - Aortic valve: Mildly calcified annulus. Trileaflet. There was   trivial regurgitation. - Mitral valve: Mildly thickened leaflets . There was moderate   regurgitation directed posteriorly. - Left atrium: The atrium was mildly dilated. - Right atrium: Central venous pressure (est): 3 mm Hg. - Tricuspid valve: There was mild regurgitation. - Pulmonary arteries: PA peak pressure: 36 mm Hg (S). - Pericardium, extracardiac: There was no pericardial effusion.  Impressions:  - Mild LVH with mild LV chamber dilatation and LVEF 30-35%.   Inferior/inferolateral akinesis. Restrictive diastolic filling   pattern noted. Mild left atrial enlargement. Mildly thickened   mitral leaflets with moderate, posteriorly directed mitral   regurgitation. Mildly calcified aortic annulus with trivial   aortic regurgitation. Mild tricuspid regurgitation with PASP 36   mmHg.  Patient Profile     55 year old male with CAD status post DES to the circumflex in June 2013 (patent at angiography in June of this year with otherwise moderate RCA and LAD disease managed medically), nonischemic cardiomyopathy with recently documented LVEF 30-35% and restrictive diastolic filling pattern, moderate mitral regurgitation, alcohol abuse, essential hypertension, and hyperlipidemia. He presented with acute respiratory failure associated with flash pulmonary edema and volume overload, no definite ACS. He has improved with diuresis and medication adjustments.  Assessment & Plan    1. Acute on chronic combined heart failure. Clinically improved. Has diuresed approximately 4700 cc.  2. Nonischemic cardiomyopathy with LVEF 30-35% and  restrictive diastolic filling pattern. Medical regimen now includes Aldactone, Lasix, KCl, Norvasc, Toprol-XL and Entresto.  3. CAD status post DES to the circumflex in 2013, patent at a recent angiography with moderate RCA and LAD disease. No active angina symptoms or clear evidence of ACS. He is on aspirin and statin therapy.  4. Transient acute renal insufficiency, creatinine improved to 1.0.  5. History of alcohol abuse, cessation discussed and patient trying to cut back.  6. Essential hypertension, blood pressure trend is better.  Anticipate discharge home today. Continue current cardiac regimen (note change to Entresto from Avapro). We will schedule office follow-up in the next 7-10 days.  Signed, Aloha Gell  Domenic Polite, M.D., F.A.C.C.  10/09/2016, 7:54 AM

## 2016-10-09 NOTE — Discharge Summary (Signed)
Physician Discharge Summary  Antonio Cobb [id number] DOB: [birth year] DOA: 10/07/2016  PCP: Tula Nakayama, MD  Admit date: 10/07/2016 Discharge date: 10/09/2016  Recommendations for Outpatient Follow-up:  1. Follow-up CHF. Cardiology will arrange outpatient follow-up in the next 7-10 days.  Follow-up Information    Tula Nakayama, MD .   Specialty:  Family Medicine Why:  as needed Contact information: [address], [place] [address] [phone]          Discharge Diagnoses:  1. Acute on chronic systolic congestive heart failure 2. Acute kidney injury 3. Nonischemic cardiomyopathy 4. Prolonged QT, resolved 5. Diabetes mellitus type 2 6. Tobacco use disorder  Discharge Condition: improved Disposition: home  Diet recommendation: low salt, diabetic diet  Filed Weights   10/07/16 1139 10/08/16 0500 10/09/16 0400  Weight: 95.4 kg (210 lb 5.1 oz) 94.1 kg (207 lb 7.3 oz) 93.1 kg (205 lb 4 oz)    History of present illness:  55 year old man with history of nonischemic cardiomyopathy LVEF 30-35 percent, diabetes, smoker, presented with sudden onset wheezing and shortness of breath, initially treated with bronchodilators and steroids. While in the emergency department, respiratory status deteriorated, patient was placed on nitroglycerin infusion, given intravenous Lasix for pulmonary edema and started on BiPAP.  Hospital Course:  Patient was treated with aggressive diuresis and rapidly improved with return of bulimia. Blood pressure stabilized. Workup was unremarkable, no evidence of ACS. Seen by cardiology with recommendation to discontinue Avapro, start Entresto. Hospitalization was uncomplicated. Individual issues as below.  1. Acute on chronic systolic congestive heart failure, likely dietary indiscretion. No evidence of ACS. Appears euvolemic. 2. Acute kidney injury, secondary to relative hypoperfusion, resolved with aggressive  diuresis. 3. PMH NICM(possible alcoholic), MI, ASCVD, LVEF 30-35%. Left heart cath 05/15/2016 showed moderate disease RCA, LAD, patent circumflex stent 4. Prolonged QT resolved. EKG no acute changes.  5. Diabetes mellitus type 2 with hyperglycemia, control improved.  6. Alcohol use disorder, no evidence of withdrawal 7. Tobacco use disorder 8. Obesity  Consultants:  Cardiology    Discharge Instructions  Discharge Instructions    (HEART FAILURE PATIENTS) Call MD:  Anytime you have any of the following symptoms: 1) 3 pound weight gain in 24 hours or 5 pounds in 1 week 2) shortness of breath, with or without a dry hacking cough 3) swelling in the hands, feet or stomach 4) if you have to sleep on extra pillows at night in order to breathe.    Complete by:  As directed    Activity as tolerated - No restrictions    Complete by:  As directed    Diet - low sodium heart healthy    Complete by:  As directed    Diet - low sodium heart healthy    Complete by:  As directed    Diet Carb Modified    Complete by:  As directed    Discharge instructions    Complete by:  As directed    Call your physician or seek immediate medical attention for shortness of breath, chest pain, swelling, weight gain or worsening of condition.   Increase activity slowly    Complete by:  As directed        Medication List    STOP taking these medications   hydrochlorothiazide 25 MG tablet Commonly known as:  HYDRODIURIL   irbesartan 300 MG tablet Commonly known as:  AVAPRO     TAKE these medications   albuterol 108 (90 Base) MCG/ACT inhaler Commonly  known as:  PROVENTIL HFA;VENTOLIN HFA Two puffs before exercise , as needed, for  wheezing What changed:  how much to take  how to take this  when to take this  reasons to take this  additional instructions   amLODipine 10 MG tablet Commonly known as:  NORVASC take 1 tablet once daily   ASPIRIN ADULT LOW STRENGTH 81 MG EC tablet Generic drug:   aspirin Take 81 mg by mouth daily.   atorvastatin 40 MG tablet Commonly known as:  LIPITOR Take 1 tablet (40 mg total) by mouth daily.   freestyle lancets Four times daily testing dx e11.65   furosemide 40 MG tablet Commonly known as:  LASIX Take 1 tablet (40 mg total) by mouth daily.   glucose blood test strip Four times daily testing dx [name]   insulin aspart 100 UNIT/ML injection Commonly known as:  novoLOG Inject 15 Units into the skin 3 (three) times daily before meals. Sliding scale begins at 15 units and increases as CBG dictates What changed:  how much to take  additional instructions   ipratropium-albuterol 0.5-2.5 (3) MG/3ML Soln Commonly known as:  DUONEB Take 3 mLs by nebulization every 6 (six) hours as needed. What changed:  reasons to take this   LANTUS SOLOSTAR 100 UNIT/ML Solostar Pen Generic drug:  Insulin Glargine Inject 60 Units into the skin daily at 10 pm. Everynight   metoprolol succinate 50 MG 24 hr tablet Commonly known as:  TOPROL-XL Take 1 tablet (50 mg total) by mouth daily. Take with or immediately following a meal.   nitroGLYCERIN 0.4 MG SL tablet Commonly known as:  NITROSTAT Place 1 tablet (0.4 mg total) under the tongue every 5 (five) minutes as needed for chest pain.   potassium chloride SA 20 MEQ tablet Commonly known as:  KLOR-CON M20 Take 1 tablet (20 mEq total) by mouth daily.   sacubitril-valsartan 49-51 MG Commonly known as:  ENTRESTO Take 1 tablet by mouth 2 (two) times daily.   SPIRIVA HANDIHALER 18 MCG inhalation capsule Generic drug:  tiotropium inhale the contents of one capsule in the handihaler once daily   spironolactone 25 MG tablet Commonly known as:  ALDACTONE Take 1 tablet (25 mg total) by mouth daily.   temazepam 7.5 MG capsule Commonly known as:  RESTORIL Take 1 capsule (7.5 mg total) by mouth at bedtime as needed for sleep.      No Known Allergies  The results of significant diagnostics from this  hospitalization (including imaging, microbiology, ancillary and laboratory) are listed below for reference.    Significant Diagnostic Studies: Dg Chest Port 1 View  Result Date: 10/07/2016 CLINICAL DATA:  Shortness of breath. EXAM: PORTABLE CHEST 1 VIEW COMPARISON:  Radiographs of Apr 27, 2016. FINDINGS: Stable cardiomediastinal silhouette. No pneumothorax or pleural effusion is noted. Mild perihilar and basilar interstitial densities are noted concerning for possible pulmonary edema. Bony thorax is unremarkable. IMPRESSION: Mild perihilar and basilar interstitial densities concerning for possible pulmonary edema. Electronically Signed   By: Marijo Conception, M.D.   On: 10/07/2016 07:41    Microbiology: Recent Results (from the past 240 hour(s))  MRSA PCR Screening     Status: None   Collection Time: 10/07/16 11:30 AM  Result Value Ref Range Status   MRSA by PCR NEGATIVE NEGATIVE Final    Comment:        The GeneXpert MRSA Assay (FDA approved for NASAL specimens only), is one component of a comprehensive MRSA colonization surveillance program. It  is not intended to diagnose MRSA infection nor to guide or monitor treatment for MRSA infections.      Labs: Basic Metabolic Panel:  Recent Labs Lab 10/07/16 0700 10/08/16 0533 10/09/16 0446  NA 135 137 136  K 3.6 3.7 3.8  CL 100* 103 104  CO2 25 27 26   GLUCOSE 406* 280* 167*  BUN 21* 28* 24*  CREATININE 1.41* 1.18 1.00  CALCIUM 8.9 8.8* 8.7*   Liver Function Tests:  Recent Labs Lab 10/07/16 0700  AST 53*  ALT 57  ALKPHOS 91  BILITOT 0.9  PROT 8.1  ALBUMIN 4.1   CBC:  Recent Labs Lab 10/07/16 0700  WBC 15.9*  NEUTROABS 8.0*  HGB 15.8  HCT 47.4  MCV 88.1  PLT 283   Cardiac Enzymes:  Recent Labs Lab 10/07/16 0700 10/07/16 1246 10/07/16 1921 10/08/16 0035  TROPONINI 0.03* 0.03* 0.03* <0.03   BNP: BNP (last 3 results)  Recent Labs  04/27/16 0945 10/07/16 0700  BNP 357.0* 374.0*     CBG:  Recent Labs Lab 10/07/16 2109 10/08/16 0731 10/08/16 1153 10/08/16 1645 10/09/16 0805  GLUCAP 296* 257* 188* 314* 162*    Principal Problem:   Acute on chronic combined systolic and diastolic heart failure (HCC) Active Problems:   Diabetes mellitus, insulin dependent (IDDM), uncontrolled (HCC)   NICM (nonischemic cardiomyopathy) (HCC)   Tobacco abuse   Alcohol abuse   Arteriosclerotic cardiovascular disease (ASCVD)   Acute pulmonary edema (HCC)   AKI (acute kidney injury) ([place])   Acute respiratory failure with hypoxia ([place])   Time coordinating discharge: 35 minutes  Signed:  Murray Hodgkins, MD [facility] 10/09/2016, 10:02 AM

## 2016-10-09 NOTE — Discharge Instructions (Signed)
Acute Respiratory Distress Syndrome Acute respiratory distress syndrome is a life-threatening condition in which fluid collects in the lungs. This condition can cause severe shortness of breath and low oxygen levels in the blood. It can also cause the lungs and other vital organs to fail. The condition usually develops within 24 to 48 hours of an infection, illness, surgery, or injury. CAUSES This condition may be caused by:  An infection, such as sepsis or pneumonia.  A serious injury to the head or chest.  A major surgery.  A drug overdose.  Breathing in harmful chemicals or smoke.  Blood transfusions.  A blood clot in the lungs. SYMPTOMS Symptoms of this condition include:  Fever.  Difficulty breathing.  Bluish skin (cyanosis).  A fast or irregular heartbeat.  Low blood pressure (hypotension).  Agitation.  Confusion.  Lack of energy (lethargy).  Sweating. DIAGNOSIS This condition may be diagnosed by using tests to rule out other diseases and conditions that cause similar symptoms. You may have:  A chest X-ray or CT scan.  Blood tests.  A sputum culture. In this test, you will be asked to spit so that a sample of lung fluid can be taken for testing.  Bronchoscopy. During this test a thin, flexible tool is passed into the mouth or nose, down the windpipe, and into the lungs. TREATMENT This condition may be treated with:  Oxygen. A breathing machine (ventilator) is often used to provide oxygen and help with breathing.  Medicine to help you relax (sedative).  Fluids and nutrients given through an IV tube.  Blood pressure medicine.  Steroid medicine to help decrease inflammation in the lungs.  Diuretic medicine to get rid of extra fluid in the body. Additional treatment may be needed, depending on the cause of the condition. It can take up to 12 months to recover from this condition. Some people recover fully, but others may continue to  have:  Weakness.  Shortness of breath.  Memory problems.  Depression. HOME CARE INSTRUCTIONS Until you recover from this condition:  Do not smoke.  Limit alcohol intake to no more than 1 drink per day for nonpregnant women and 2 drinks per day for men. One drink equals 12 oz of beer, 5 oz of wine, or 1 oz of hard liquor.  Ask friends and family to help you if daily activities make you tired. SEEK MEDICAL CARE IF:  You become short of breath with activity or while resting.  You develop a cough that does not go away.  You have a fever. SEEK IMMEDIATE MEDICAL CARE IF:  You have sudden shortness of breath.  You develop chest pain that does not go away.  You develop swelling or pain in one of your legs.  You cough up blood.   This information is not intended to replace advice given to you by your health care provider. Make sure you discuss any questions you have with your health care provider.   Document Released: 11/30/2005 Document Revised: 04/16/2015 Document Reviewed: 11/26/2014 Elsevier Interactive Patient Education 2016 [employer].  Respiratory failure is when your lungs are not working well and your breathing (respiratory) system fails. When respiratory failure occurs, it is difficult for your lungs to get enough oxygen, get rid of carbon dioxide, or both. Respiratory failure can be life threatening.  Respiratory failure can be acute or chronic. Acute respiratory failure is sudden, severe, and requires emergency medical treatment. Chronic respiratory failure is less severe, happens over time, and requires ongoing treatment.  WHAT ARE THE CAUSES OF ACUTE RESPIRATORY FAILURE?  Any problem affecting the heart or lungs can cause acute respiratory failure. Some of these causes include the following:  Chronic bronchitis and emphysema (COPD).   Blood clot going to a lung (pulmonary embolism).   Having water in the lungs caused by heart failure, lung injury, or  infection (pulmonary edema).   Collapsed lung (pneumothorax).   Pneumonia.   Pulmonary fibrosis.   Obesity.   Asthma.   Heart failure.   Any type of trauma to the chest that can make breathing difficult.   Nerve or muscle diseases making chest movements difficult. HOW WILL MY ACUTE RESPIRATORY FAILURE BE TREATED?  Treatment of acute respiratory failure depends on the cause of the respiratory failure. Usually, you will stay in the intensive care unit so your breathing can be watched closely. Treatment can include the following:  Oxygen. Oxygen can be delivered through the following:  Nasal cannula. This is small tubing that goes in your nose to give you oxygen.  Face mask. A face mask covers your nose and mouth to give you oxygen.  Medicine. Different medicines can be given to help with breathing. These can include:  Nebulizers. Nebulizers deliver medicines to open the air passages (bronchodilators). These medicines help to open or relax the airways in the lungs so you can breathe better. They can also help loosen mucus from your lungs.  Diuretics. Diuretic medicines can help you breathe better by getting rid of extra water in your body.  Steroids. Steroid medicines can help decrease swelling (inflammation) in your lungs.  Antibiotics.  Chest tube. If you have a collapsed lung (pneumothorax), a chest tube is placed to help reinflate the lung.  Noninvasive positive pressure ventilation (NPPV). This is a tight-fitting mask that goes over your nose and mouth. The mask has tubing that is attached to a machine. The machine blows air into the tubing, which helps to keep the tiny air sacs (alveoli) in your lungs open. This machine allows you to breathe on your own.  Ventilator. A ventilator is a breathing machine. When on a ventilator, a breathing tube is put into the lungs. A ventilator is used when you can no longer breathe well enough on your own. You may have low oxygen  levels or high carbon dioxide (CO2) levels in your blood. When you are on a ventilator, sedation and pain medicines are given to make you sleep so your lungs can heal. SEEK IMMEDIATE MEDICAL CARE IF:  You have shortness of breath (dyspnea) with or without activity.  You have rapid breathing (tachypnea).  You are wheezing.  You are unable to say more than a few words without having to catch your breath.  You find it very difficult to function normally.  You have a fast heart rate.  You have a bluish color to your finger or toe nail beds.  You have confusion or drowsiness or both.   This information is not intended to replace advice given to you by your health care provider. Make sure you discuss any questions you have with your health care provider.   Document Released: 12/05/2013 Document Revised: 08/21/2015 Document Reviewed: 12/05/2013 Elsevier Interactive Patient Education [employer].

## 2016-10-09 NOTE — Progress Notes (Signed)
PROGRESS NOTE  MECCA PUFF [id number] DOB: February 04, 1961 DOA: 10/07/2016 PCP: Tula Nakayama, MD  Brief Narrative: 55 year old man with history of nonischemic cardiomyopathy LVEF 30-35 percent, diabetes, smoker, presented with sudden onset wheezing and shortness of breath, initially treated with bronchodilators and steroids. While in the emergency department, respiratory status deteriorated, patient was placed on nitroglycerin infusion, given intravenous Lasix for pulmonary edema and started on BiPAP.  Assessment/Plan: 1.  Acute on chronic systolic congestive heart failure, likely dietary indiscretion. No evidence of ACS. Appears euvolemic. 2. Acute kidney injury, secondary to relative hypoperfusion, resolved with aggressive diuresis. 3. PMH NICM(possible alcoholic), MI, ASCVD, LVEF 30-35%. Left heart cath 05/15/2016 showed moderate disease RCA, LAD, patent circumflex stent 4. Prolonged QT resolved. EKG no acute changes.  5. Diabetes mellitus type 2 with hyperglycemia, control improved.  6. Alcohol use disorder, no evidence of withdrawal 7. Tobacco use disorder 8. Obesity   Doing quite well at this point. Exam benign. Plan discharge home.  Medical regimen now includes Aldactone, Lasix, KCl, Norvasc, Toprol-XL and Entresto.  Hydrochlorothiazide and Avapro discontinued.  DVT prophylaxis: Lovenox Code Status: full code Family Communication: wife at bedside Disposition Plan: home  Murray Hodgkins, MD  [facility] Direct contact: [phone] --[place] [place]  --[email or web address]; password [email or web address]  7PM-7AM contact night coverage as above 10/09/2016, 9:00 AM  LOS: 2 days   Consultants:  Cardiology   Procedures:    Antimicrobials:    CC: f/u CHF  Interval history/Subjective:   No problems overnight. Breathing better. No pain.  Objective: Vitals:   10/08/16 2000 10/09/16 0000 10/09/16 0400 10/09/16 0810  BP: (!) 112/92   110/85  Pulse:    83  Resp:    17   Temp: 98 F (36.7 C) 98.5 F (36.9 C) 97.4 F (36.3 C)   TempSrc: Oral Oral    SpO2:    97%  Weight:   93.1 kg (205 lb 4 oz)   Height:        Intake/Output Summary (Last 24 hours) at 10/09/16 0900 Last data filed at 10/09/16 0526  Gross per 24 hour  Intake              720 ml  Output             2350 ml  Net            -1630 ml     Filed Weights   10/07/16 1139 10/08/16 0500 10/09/16 0400  Weight: 95.4 kg (210 lb 5.1 oz) 94.1 kg (207 lb 7.3 oz) 93.1 kg (205 lb 4 oz)    Exam: Constitutional:  . Appears calm, comfortable, sitting on the side of the bed Respiratory:  . Clear to auscultation bilaterally. No wheezes, rales or rhonchi. Normal respiratory effort. Cardiovascular:  . Regular rate and rhythm. No murmur, rub or gallop. No lower extremity edema. Psychiatric: Mood, affect appear normal. Insight and judgment appear normal. o     I have personally reviewed following labs and imaging studies:  Urine output 2350. -4.7 L since admission.   Basic metabolic panel unremarkable  Scheduled Meds: . amLODipine  10 mg Oral Daily  . aspirin EC  81 mg Oral Daily  . atorvastatin  40 mg Oral q1800  . enoxaparin (LOVENOX) injection  40 mg Subcutaneous Q24H  . folic acid  1 mg Oral Daily  . furosemide  40 mg Oral Daily  . insulin aspart  0-20 Units Subcutaneous TID WC  . insulin aspart  0-5 Units  Subcutaneous QHS  . insulin glargine  60 Units Subcutaneous Q2200  . mouth rinse  15 mL Mouth Rinse BID  . metoprolol succinate  25 mg Oral Daily  . montelukast  10 mg Oral QHS  . multivitamin with minerals  1 tablet Oral Daily  . nitroGLYCERIN  5-200 mcg/min Intravenous Once  . potassium chloride SA  20 mEq Oral Daily  . sacubitril-valsartan  1 tablet Oral BID  . sodium chloride flush  3 mL Intravenous Q12H  . spironolactone  25 mg Oral Daily  . thiamine  100 mg Oral Daily   Or  . thiamine  100 mg Intravenous Daily  . tiotropium  18 mcg Inhalation Daily   Continuous  Infusions:   Principal Problem:   Acute on chronic combined systolic and diastolic heart failure (HCC) Active Problems:   Diabetes mellitus, insulin dependent (IDDM), uncontrolled (HCC)   NICM (nonischemic cardiomyopathy) (HCC)   Tobacco abuse   Alcohol abuse   Arteriosclerotic cardiovascular disease (ASCVD)   Acute pulmonary edema (HCC)   AKI (acute kidney injury) ([place])   Acute respiratory failure with hypoxia ([place])   LOS: 2 days

## 2016-10-23 ENCOUNTER — Ambulatory Visit (INDEPENDENT_AMBULATORY_CARE_PROVIDER_SITE_OTHER): Payer: BLUE CROSS/BLUE SHIELD | Admitting: Adult Health

## 2016-10-23 VITALS — BP 110/78 | HR 86 | Ht 70.0 in | Wt 210.0 lb

## 2016-10-23 DIAGNOSIS — I2510 Atherosclerotic heart disease of native coronary artery without angina pectoris: Secondary | ICD-10-CM | POA: Diagnosis not present

## 2016-10-23 DIAGNOSIS — I10 Essential (primary) hypertension: Secondary | ICD-10-CM

## 2016-10-23 DIAGNOSIS — I428 Other cardiomyopathies: Secondary | ICD-10-CM

## 2016-10-23 NOTE — Patient Instructions (Signed)
Your physician recommends that you schedule a follow-up appointment in: 1 Month   Your physician recommends that you return for lab work in: 1 Month before your next visit.   You have been given samples of Entresto   Your physician recommends that you continue on your current medications as directed. Please refer to the Current Medication list given to you today.  If you need a refill on your cardiac medications before your next appointment, please call your pharmacy.  Thank you for choosing [facility]!

## 2016-10-23 NOTE — Progress Notes (Signed)
Name: Antonio Cobb    DOB: 04-29-1961  Age: 55 y.o.  MR#: [id number]       PCP:  Tula Nakayama, MD      Insurance: Payor: [place] / Plan: [place] / Product Type: *No Product type* /   CC:   No chief complaint on file.   VS Vitals:   10/23/16 1611  BP: 110/78  Pulse: 86  SpO2: 96%  Weight: 210 lb (95.3 kg)  Height: 5\' 10"  (1.778 m)    Weights Current Weight  10/23/16 210 lb (95.3 kg)  10/09/16 205 lb 4 oz (93.1 kg)  09/28/16 206 lb (93.4 kg)    Blood Pressure  BP Readings from Last 3 Encounters:  10/23/16 110/78  10/09/16 110/85  09/28/16 122/72     Admit date:  (Not on file) Last encounter with RMR:  09/28/2016   Allergy Patient has no known allergies.  Current Outpatient Prescriptions  Medication Sig Dispense Refill  . albuterol (PROVENTIL HFA;VENTOLIN HFA) 108 (90 Base) MCG/ACT inhaler Two puffs before exercise , as needed, for  wheezing (Patient taking differently: Inhale 2 puffs into the lungs as needed for wheezing. Two puffs before exercise , as needed, for  wheezing) 18 g 2  . amLODipine (NORVASC) 10 MG tablet take 1 tablet once daily 30 tablet 4  . aspirin (ASPIRIN ADULT LOW STRENGTH) 81 MG EC tablet Take 81 mg by mouth daily.      [name] atorvastatin (LIPITOR) 40 MG tablet Take 1 tablet (40 mg total) by mouth daily. 90 tablet 1  . furosemide (LASIX) 40 MG tablet Take 1 tablet (40 mg total) by mouth daily. 90 tablet 1  . glucose blood test strip Four times daily testing dx [name] 150 each 1  . insulin aspart (NOVOLOG) 100 UNIT/ML injection Inject 15 Units into the skin 3 (three) times daily before meals. Sliding scale begins at 15 units and increases as CBG dictates (Patient taking differently: Inject 10-30 Units into the skin 3 (three) times daily before meals. Sliding scale per home instructions) 10 mL 0  . Insulin Glargine (LANTUS SOLOSTAR) 100 UNIT/ML Solostar Pen Inject 60 Units into the skin daily at 10 pm. Everynight    .  ipratropium-albuterol (DUONEB) 0.5-2.5 (3) MG/3ML SOLN Take 3 mLs by nebulization every 6 (six) hours as needed. (Patient taking differently: Take 3 mLs by nebulization every 6 (six) hours as needed (for wheezing or shortness of breath). ) 360 mL 0  . Lancets (FREESTYLE) lancets Four times daily testing dx e11.65 150 each 1  . metoprolol succinate (TOPROL-XL) 50 MG 24 hr tablet Take 1 tablet (50 mg total) by mouth daily. Take with or immediately following a meal. 90 tablet 0  . nitroGLYCERIN (NITROSTAT) 0.4 MG SL tablet Place 1 tablet (0.4 mg total) under the tongue every 5 (five) minutes as needed for chest pain. 25 tablet 3  . potassium chloride SA (KLOR-CON M20) 20 MEQ tablet Take 1 tablet (20 mEq total) by mouth daily. 90 tablet 3  . SPIRIVA HANDIHALER 18 MCG inhalation capsule inhale the contents of one capsule in the handihaler once daily 30 capsule 0  . spironolactone (ALDACTONE) 25 MG tablet Take 1 tablet (25 mg total) by mouth daily. 90 tablet 1  . temazepam (RESTORIL) 7.5 MG capsule Take 1 capsule (7.5 mg total) by mouth at bedtime as needed for sleep. 30 capsule 3  . sacubitril-valsartan (ENTRESTO) 49-51 MG Take 1 tablet by mouth 2 (two) times daily. [place]  tablet 0   No current facility-administered medications for this visit.     Discontinued Meds:   There are no discontinued medications.  Patient Active Problem List   Diagnosis Date Noted  . Acute respiratory failure with hypoxia ([place])   . Acute pulmonary edema ([place]) 10/07/2016  . AKI (acute kidney injury) ([place]) 10/07/2016  . Prolonged QT interval 10/07/2016  . Coronary artery disease involving coronary bypass graft of native heart with angina pectoris ([place])   . Insomnia 06/17/2016  . Abnormal echocardiogram   . Acute on chronic combined systolic and diastolic heart failure ([place])   . Wheezing 04/28/2016  . Acute asthma flare 10/09/2015  . Seasonal allergies 05/06/2015  . Cough 05/10/2013  . NICM (nonischemic cardiomyopathy)  ([place]) 05/23/2012  . Tobacco abuse 05/23/2012  . Alcohol abuse 05/23/2012  . Arteriosclerotic cardiovascular disease (ASCVD) 05/23/2012  . ED (erectile dysfunction) 05/10/2012  . Overweight 08/12/2010  . Diabetes mellitus, insulin dependent (IDDM), uncontrolled ([place]) 03/08/2008  . Hyperlipidemia LDL goal <70 03/08/2008  . Essential hypertension 03/08/2008    LABS    Component Value Date/Time   NA 136 10/09/2016 0446   NA 137 10/08/2016 0533   NA 135 10/07/2016 0700   K 3.8 10/09/2016 0446   K 3.7 10/08/2016 0533   K 3.6 10/07/2016 0700   CL 104 10/09/2016 0446   CL 103 10/08/2016 0533   CL 100 (L) 10/07/2016 0700   CO2 26 10/09/2016 0446   CO2 27 10/08/2016 0533   CO2 25 10/07/2016 0700   GLUCOSE 167 (H) 10/09/2016 0446   GLUCOSE 280 (H) 10/08/2016 0533   GLUCOSE 406 (H) 10/07/2016 0700   BUN 24 (H) 10/09/2016 0446   BUN 28 (H) 10/08/2016 0533   BUN 21 (H) 10/07/2016 0700   CREATININE 1.00 10/09/2016 0446   CREATININE 1.18 10/08/2016 0533   CREATININE 1.41 (H) 10/07/2016 0700   CREATININE 1.29 09/28/2016 1613   CREATININE 1.16 06/17/2016 1638   CREATININE 1.07 03/12/2016 1111   CALCIUM 8.7 (L) 10/09/2016 0446   CALCIUM 8.8 (L) 10/08/2016 0533   CALCIUM 8.9 10/07/2016 0700   GFRNONAA >60 10/09/2016 0446   GFRNONAA >60 10/08/2016 0533   GFRNONAA 54 (L) 10/07/2016 0700   GFRNONAA 70 06/17/2016 1638   GFRNONAA 78 03/12/2016 1111   GFRNONAA 80 10/09/2015 1648   GFRAA >60 10/09/2016 0446   GFRAA >60 10/08/2016 0533   GFRAA >60 10/07/2016 0700   GFRAA 81 06/17/2016 1638   GFRAA >89 03/12/2016 1111   GFRAA >89 10/09/2015 1648   CMP     Component Value Date/Time   NA 136 10/09/2016 0446   K 3.8 10/09/2016 0446   CL 104 10/09/2016 0446   CO2 26 10/09/2016 0446   GLUCOSE 167 (H) 10/09/2016 0446   BUN 24 (H) 10/09/2016 0446   CREATININE 1.00 10/09/2016 0446   CREATININE 1.29 09/28/2016 1613   CALCIUM 8.7 (L) 10/09/2016 0446   PROT 8.1 10/07/2016 0700   ALBUMIN  4.1 10/07/2016 0700   AST 53 (H) 10/07/2016 0700   ALT 57 10/07/2016 0700   ALKPHOS 91 10/07/2016 0700   BILITOT 0.9 10/07/2016 0700   GFRNONAA >60 10/09/2016 0446   GFRNONAA 70 06/17/2016 1638   GFRAA >60 10/09/2016 0446   GFRAA 81 06/17/2016 1638       Component Value Date/Time   WBC 15.9 (H) 10/07/2016 0700   WBC 9.2 05/12/2016 1610   WBC 9.0 04/27/2016 0943   HGB 15.8 10/07/2016 0700   HGB  14.0 05/12/2016 1610   HGB 14.3 04/27/2016 0943   HCT 47.4 10/07/2016 0700   HCT 41.5 05/12/2016 1610   HCT 44.1 04/27/2016 0943   MCV 88.1 10/07/2016 0700   MCV 85.9 05/12/2016 1610   MCV 88.2 04/27/2016 0943    Lipid Panel     Component Value Date/Time   CHOL 165 03/12/2016 1111   TRIG 309 (H) 03/12/2016 1111   HDL 33 (L) 03/12/2016 1111   CHOLHDL 5.0 03/12/2016 1111   VLDL 62 (H) 03/12/2016 1111   LDLCALC 70 03/12/2016 1111   LDLDIRECT TEST NOT PERFORMED 06/19/2011 1230    ABG    Component Value Date/Time   PHART 7.417 05/15/2016 0834   PCO2ART 40.9 05/15/2016 0834   PO2ART 73.0 (L) 05/15/2016 0834   HCO3 27.9 (H) 05/15/2016 0837   TCO2 29 05/15/2016 0837   O2SAT 66.0 05/15/2016 0837     Lab Results  Component Value Date   TSH 1.38 03/12/2016   BNP (last 3 results)  Recent Labs  04/27/16 0945 10/07/16 0700  BNP 357.0* 374.0*    ProBNP (last 3 results) No results for input(s): PROBNP in the last 8760 hours.  Cardiac Panel (last 3 results) No results for input(s): CKTOTAL, CKMB, TROPONINI, RELINDX in the last 72 hours.  Iron/TIBC/Ferritin/ %Sat No results found for: IRON, TIBC, FERRITIN, IRONPCTSAT   EKG Orders placed or performed during the hospital encounter of 10/07/16  . EKG 12-Lead  . EKG 12-Lead  . EKG  . EKG 12-Lead  . EKG 12-Lead     Prior Assessment and Plan Problem List as of 10/23/2016 Reviewed: 09/28/2016  4:05 PM by Jory Sims, NP     Cardiovascular and Mediastinum   Essential hypertension   Last Assessment & Plan 06/17/2016  Office Visit Written 06/17/2016 10:37 PM by [name], MD    Controlled, no change in medication DASH diet and commitment to daily physical activity for a minimum of 30 minutes discussed and encouraged, as a part of hypertension management. The importance of attaining a healthy weight is also discussed.  BP/Weight 06/17/2016 05/29/2016 05/15/2016 05/12/2016 04/28/2016 04/27/2016 [id number]  Systolic BP A999333 [id number] A999333 A999333 [id number]  Diastolic BP 76 74 85 90 76 102 82  Wt. (Lbs) 209 203 209 205 202 209 209  BMI 29.99 29.13 29.99 29.41 28.98 30.85 29.99            NICM (nonischemic cardiomyopathy) [facility])   Last Assessment & Plan 04/27/2016 Office Visit Written 04/28/2016  8:00 AM by [name], MD    Signs and symptoms of heart failyure. Needs ED evaluation for full evaluation, pt reports near syncope this morning and is in cardiopulmonary distress. Discussed with ED Provider prior to sending to ED with his wife accompanying him      Arteriosclerotic cardiovascular disease (ASCVD)   Last Assessment & Plan 03/12/2016 Office Visit Written 03/15/2016  8:24 PM by [name], MD    reports recent chest discomfort , needs Cardiology re eval      Acute on chronic combined systolic and diastolic heart failure ([place])   Coronary artery disease involving coronary bypass graft of native heart with angina pectoris [facility])     Respiratory   Seasonal allergies   Last Assessment & Plan 05/06/2015 Office Visit Written 06/09/2015  7:45 PM by [name], MD    Uncontrolled , start daily meds and aggressive short course of steroids also daily saline flushes of nares  Acute asthma flare   Last Assessment & Plan 10/09/2015 Office Visit Written 10/14/2015  5:14 AM by [name], MD    Neb treatment in office and IM depo medrol followed by prednisone dose pack. Re educated re need for daily spirometry to help with asthma evaluation from home so less chance of acute decompensation,  also need to use daily prophylactic medication      Acute pulmonary edema ([place])   Acute respiratory failure with hypoxia ([place])     Endocrine   Diabetes mellitus, insulin dependent (IDDM), uncontrolled ([place])   Last Assessment & Plan 06/17/2016 Office Visit Written 06/17/2016 10:40 PM by [name], MD    Uncontrolled, needs to re establish with endo Updated lab needed  Mr. Conteh is reminded of the importance of commitment to daily physical activity for 30 minutes or more, as able and the need to limit carbohydrate intake to 30 to 60 grams per meal to help with blood sugar control.   The need to take medication as prescribed, test blood sugar as directed, and to call between visits if there is a concern that blood sugar is uncontrolled is also discussed.   Mr. Eftink is reminded of the importance of daily foot exam, annual eye examination, and good blood sugar, blood pressure and cholesterol control.  Diabetic Labs Latest Ref Rng 05/15/2016 05/12/2016 04/27/2016 04/20/2016 03/12/2016  HbA1c <5.7 % - - - - 11.5(H)  Microalbumin <2.0 mg/dL - - - - -  Micro/Creat Ratio 0.0 - 30.0 mg/g - - - - -  Chol 125 - 200 mg/dL - - - - 165  HDL >=40 mg/dL - - - - 33(L)  Calc LDL <130 mg/dL - - - - 70  Triglycerides <150 mg/dL - - - - 309(H)  Creatinine 0.61 - 1.24 mg/dL 1.31(H) 0.81 0.94 0.96 1.07   BP/Weight 06/17/2016 05/29/2016 05/15/2016 05/12/2016 04/28/2016 04/27/2016 A999333  Systolic BP A999333 [id number] [id number] [id number] A999333 A999333 [id number]  Diastolic BP 76 74 85 90 76 102 82  Wt. (Lbs) 209 203 209 205 202 209 209  BMI 29.99 29.13 29.99 29.41 28.98 30.85 29.99   Foot/eye exam completion dates Latest Ref Rng 10/10/2015 05/06/2015  Eye Exam No Retinopathy No Retinopathy -  Foot Form Completion - - Done               Genitourinary   ED (erectile dysfunction)   Last Assessment & Plan 05/10/2012 Office Visit Written 05/15/2012  9:59 AM by [name], MD    Deterioration in performance, requests medication  trial, wife present and nbi agreement      AKI (acute kidney injury) ([place])     Other   Hyperlipidemia LDL goal <70   Last Assessment & Plan 06/17/2016 Office Visit Written 06/17/2016 10:40 PM by [name], MD    Hyperlipidemia:Low fat diet discussed and encouraged. Needs to reduce fat intake and commit to regular exercise   Lipid Panel  Lab Results  Component Value Date   CHOL 165 03/12/2016   HDL 33* 03/12/2016   LDLCALC 70 03/12/2016   LDLDIRECT TEST NOT PERFORMED 06/19/2011   TRIG 309* 03/12/2016   CHOLHDL 5.0 03/12/2016            Overweight   Last Assessment & Plan 10/09/2015 Office Visit Written 10/14/2015  5:19 AM by [name], MD    Deteriorated. Patient re-educated about  the importance of commitment to a  minimum of 150 minutes of exercise per  week.  The importance of healthy food choices with portion control discussed. Encouraged to start a food diary, count calories and to consider  joining a support group. Sample diet sheets offered. Goals set by the patient for the next several months.   Weight /BMI 10/09/2015 05/06/2015 03/23/2015  WEIGHT 209 lb 205 lb 1.9 oz 194 lb 12.8 oz  HEIGHT 5\' 10"  5\' 10"  5\' 10"   BMI 29.99 kg/m2 29.43 kg/m2 27.95 kg/m2          Tobacco abuse   Last Assessment & Plan 04/27/2016 Office Visit Edited 04/28/2016  8:04 AM by [name], MD    Patient counseled s regarding the health risks of ongoing nicotine use, specifically all types of cancer, heart disease, stroke and respiratory failure. The options available for help with cessation ,the behavioral changes to assist the process, and the option to either gradully reduce usage  Or abruptly stop.is also discussed. Pt is also encouraged to set specific goals in number of cigarettes used daily, as well as to set a quit date.         Alcohol abuse   Last Assessment & Plan 06/17/2016 Office Visit Written 06/17/2016 10:39 PM by [name], MD    Ongoing  dependence , refusing treatment states he can do this on his own, spouse present and encouraged to help him as best able      Cough   Last Assessment & Plan 05/10/2013 Office Visit Written 05/14/2013  9:32 PM by [name], MD    H/o exercise induce cough, proventil prescribed for use before exercise      Wheezing   Last Assessment & Plan 04/27/2016 Office Visit Written 04/28/2016  7:59 AM by [name], MD    Bilateral wheezes with reduced air entry and low oxygen Neb treatment in office      Abnormal echocardiogram   Insomnia   Last Assessment & Plan 06/17/2016 Office Visit Written 06/17/2016 10:38 PM by [name], MD    Sleep hygiene reviewed and written information offered also. Prescription sent for  medication needed.       Prolonged QT interval       Imaging: Dg Chest Port 1 View  Result Date: 10/07/2016 CLINICAL DATA:  Shortness of breath. EXAM: PORTABLE CHEST 1 VIEW COMPARISON:  Radiographs of Apr 27, 2016. FINDINGS: Stable cardiomediastinal silhouette. No pneumothorax or pleural effusion is noted. Mild perihilar and basilar interstitial densities are noted concerning for possible pulmonary edema. Bony thorax is unremarkable. IMPRESSION: Mild perihilar and basilar interstitial densities concerning for possible pulmonary edema. Electronically Signed   By: Marijo Conception, M.D.   On: 10/07/2016 07:41

## 2016-10-23 NOTE — Progress Notes (Signed)
Cardiology Office Note   Date:  10/24/2016   ID:  DINA MURGA, DOB [birth year], MRN [id number]  PCP:  Tula Nakayama, MD  Cardiologist: Cloria Spring, NP   No chief complaint on file. History of Present Illness: Antonio Cobb is a 55 y.o. male who presents for ongoing assessment and management of nonischemic cardiomyopathy, EF of 30-35%, history of CAD with stent to the mid circumflex, hypertension, hyperlipidemia, with history of COPD. Most recent cardiac catheterization in June 2017:   Patent stent in the mid circumflex.  Moderate disease in the RCA and LAD.  Mild pulmonary hypertension.  Cardiac output 5.1 L/m. Cardiac index 2.4  Pulmonary artery saturation 66%.  On last office visit, the patient was stable, echocardiogram was to be repeated to compared to July 2017 to evaluate for improvement in systolic function. He was also counseled on ongoing alcohol abuse, was planning on connecting to Alcoholics Anonymous. No medication changes were made.  Echocardiogram 10/06/2016 Left ventricle: The cavity size was mildly dilated. Wall   thickness was increased in a pattern of mild LVH. Systolic   function was moderately to severely reduced. The estimated   ejection fraction was in the range of 30% to 35%. There is   akinesis of the inferolateral and inferior myocardium. Doppler   parameters are consistent with restrictive physiology, indicative   of decreased left ventricular diastolic compliance and/or   increased left atrial pressure. - Aortic valve: Mildly calcified annulus. Trileaflet. There was   trivial regurgitation. - Mitral valve: Mildly thickened leaflets . There was moderate   regurgitation directed posteriorly. - Left atrium: The atrium was mildly dilated. - Right atrium: Central venous pressure (est): 3 mm Hg. - Tricuspid valve: There was mild regurgitation. - Pulmonary arteries: PA peak pressure: 36 mm Hg (S). - Pericardium, extracardiac:  There was no pericardial effusion.  He reports no recurrence of chest pain. He has not started [facility] as directed as his pharmacy had to order the medication. He remains on ARB and HCTZ now. He is doing better with dietary compliance and is weighing himself daily. His wife is very conscientiously monitoring his progress.   Past Medical History:  Diagnosis Date  . Alcohol abuse   . CAD (coronary artery disease)    DES to circumflex June 2013  . Cardiomyopathy (HCC)    LVEF 30-35%  . COPD (chronic obstructive pulmonary disease) ([place])   . Diabetes mellitus, type 2 ([place])   . Essential hypertension   . Mitral regurgitation    Moderate  . Mixed hyperlipidemia   . Myocardial infarction 05/23/2012   Inferolateral STEMI  . Obesity     Past Surgical History:  Procedure Laterality Date  . CARDIAC CATHETERIZATION  2 yrs ago  . CARDIAC CATHETERIZATION N/A 05/15/2016   Procedure: Right/Left Heart Cath and Coronary Angiography;  Surgeon: [name], MD;  Location: [facility];  Service: Cardiovascular;  Laterality: N/A;  . CORONARY STENT PLACEMENT  05/23/12  . LEFT HEART CATHETERIZATION WITH CORONARY ANGIOGRAM N/A 05/23/2012   Procedure: LEFT HEART CATHETERIZATION WITH CORONARY ANGIOGRAM;  Surgeon: [name], MD;  Location: [facility] CATH LAB;  Service: Cardiovascular;  Laterality: N/A;  . None    . PERCUTANEOUS CORONARY STENT INTERVENTION (PCI-S) N/A 05/23/2012   Procedure: PERCUTANEOUS CORONARY STENT INTERVENTION (PCI-S);  Surgeon: [name], MD;  Location: [facility] CATH LAB;  Service: Cardiovascular;  Laterality: N/A;  . POLYPECTOMY  10/16/2011   Procedure: POLYPECTOMY;  Surgeon: [name], MD;  Location:  [facility];  Service: Endoscopy;;  Polypoid Lesion, Transverse and Sigmoid Colon     Current Outpatient Prescriptions  Medication Sig Dispense Refill  . albuterol (PROVENTIL HFA;VENTOLIN HFA) 108 (90 Base) MCG/ACT inhaler Two puffs before exercise , as needed, for  wheezing  (Patient taking differently: Inhale 2 puffs into the lungs as needed for wheezing. Two puffs before exercise , as needed, for  wheezing) 18 g 2  . amLODipine (NORVASC) 10 MG tablet take 1 tablet once daily 30 tablet 4  . aspirin (ASPIRIN ADULT LOW STRENGTH) 81 MG EC tablet Take 81 mg by mouth daily.      [name] atorvastatin (LIPITOR) 40 MG tablet Take 1 tablet (40 mg total) by mouth daily. 90 tablet 1  . furosemide (LASIX) 40 MG tablet Take 1 tablet (40 mg total) by mouth daily. 90 tablet 1  . glucose blood test strip Four times daily testing dx [name] 150 each 1  . insulin aspart (NOVOLOG) 100 UNIT/ML injection Inject 15 Units into the skin 3 (three) times daily before meals. Sliding scale begins at 15 units and increases as CBG dictates (Patient taking differently: Inject 10-30 Units into the skin 3 (three) times daily before meals. Sliding scale per home instructions) 10 mL 0  . Insulin Glargine (LANTUS SOLOSTAR) 100 UNIT/ML Solostar Pen Inject 60 Units into the skin daily at 10 pm. Everynight    . ipratropium-albuterol (DUONEB) 0.5-2.5 (3) MG/3ML SOLN Take 3 mLs by nebulization every 6 (six) hours as needed. (Patient taking differently: Take 3 mLs by nebulization every 6 (six) hours as needed (for wheezing or shortness of breath). ) 360 mL 0  . Lancets (FREESTYLE) lancets Four times daily testing dx e11.65 150 each 1  . metoprolol succinate (TOPROL-XL) 50 MG 24 hr tablet Take 1 tablet (50 mg total) by mouth daily. Take with or immediately following a meal. 90 tablet 0  . nitroGLYCERIN (NITROSTAT) 0.4 MG SL tablet Place 1 tablet (0.4 mg total) under the tongue every 5 (five) minutes as needed for chest pain. 25 tablet 3  . potassium chloride SA (KLOR-CON M20) 20 MEQ tablet Take 1 tablet (20 mEq total) by mouth daily. 90 tablet 3  . SPIRIVA HANDIHALER 18 MCG inhalation capsule inhale the contents of one capsule in the handihaler once daily 30 capsule 0  . spironolactone (ALDACTONE) 25 MG tablet Take 1  tablet (25 mg total) by mouth daily. 90 tablet 1  . temazepam (RESTORIL) 7.5 MG capsule Take 1 capsule (7.5 mg total) by mouth at bedtime as needed for sleep. 30 capsule 3  . sacubitril-valsartan (ENTRESTO) 49-51 MG Take 1 tablet by mouth 2 (two) times daily. 60 tablet 0   No current facility-administered medications for this visit.     Allergies:   Patient has no known allergies.    Social History:  The patient  reports that he quit smoking about 5 months ago. His smoking use included Cigars. He has a 30.00 pack-year smoking history. He has never used smokeless tobacco. He reports that he drinks alcohol. He reports that he does not use drugs.   Family History:  The patient's family history includes Asthma in his sister; COPD in his father and mother; Diabetes in his mother; Emphysema in his father and mother; Heart attack in his mother; Heart disease in his mother; Hypertension in his mother; Stroke in his sister.    ROS: All other systems are reviewed and negative. Unless otherwise mentioned in H&P    PHYSICAL EXAM: VS:  BP 110/78   Pulse 86   Ht 5\' 10"  (1.778 m)   Wt 210 lb (95.3 kg)   SpO2 96%   BMI 30.13 kg/m  , BMI Body mass index is 30.13 kg/m. GEN: Well nourished, well developed, in no acute distress  HEENT: normal  Neck: no JVD, carotid bruits, or masses Cardiac: RRR; no murmurs, rubs, or gallops,no edema  Respiratory:  clear to auscultation bilaterally, normal work of breathing GI: soft, nontender, nondistended, + BS MS: no deformity or atrophy  Skin: warm and dry, no rash Neuro:  Strength and sensation are intact Psych: euthymic mood, full affect   Recent Labs: 03/12/2016: TSH 1.38 10/07/2016: ALT 57; B Natriuretic Peptide 374.0; Hemoglobin 15.8; Platelets 283 10/09/2016: BUN 24; Creatinine, Ser 1.00; Potassium 3.8; Sodium 136    Lipid Panel    Component Value Date/Time   CHOL 165 03/12/2016 1111   TRIG 309 (H) 03/12/2016 1111   HDL 33 (L) 03/12/2016 1111    CHOLHDL 5.0 03/12/2016 1111   VLDL 62 (H) 03/12/2016 1111   LDLCALC 70 03/12/2016 1111   LDLDIRECT TEST NOT PERFORMED 06/19/2011 1230      Wt Readings from Last 3 Encounters:  10/23/16 210 lb (95.3 kg)  10/09/16 205 lb 4 oz (93.1 kg)  09/28/16 206 lb (93.4 kg)       ASSESSMENT AND PLAN:  1.  Nonischemic Cardiomyopathy: He has no complaints and is being more vigilant on medications and dietary restrictions. He has not started the [facility] as directed on discharge. We have given him samples of the [facility] and he will pick up the Rx from his pharmacy in 3 days as they are supposed to have it available then. We will see him in one month for close follow up. Will need to have BMET at that time.   2. CAD: DES to to Cx with on-obstructive disease. Continue risk management.   3. Hypertension: Currently controlled on regimen. He is to stop the ARB and HCTZ and  Begin Entrestro 24/26 with samples provided. Will see him on close follow up in one month with labs  Current medicines are reviewed at length with the patient today.    Labs/ tests ordered today include: BMET  Orders Placed This Encounter  Procedures  . Basic Metabolic Panel (BMET)     Disposition:   FU with one month  Signed, Jory Sims, NP  10/24/2016 11:55 AM    [facility] [address] [place], [place] [address] Phone: [phone]; Fax: [phone]

## 2016-10-24 ENCOUNTER — Encounter: Payer: Self-pay | Admitting: Adult Health

## 2016-10-27 ENCOUNTER — Other Ambulatory Visit: Payer: Self-pay | Admitting: Family Medicine

## 2016-10-28 ENCOUNTER — Encounter: Payer: Self-pay | Admitting: Adult Health

## 2016-10-28 ENCOUNTER — Encounter: Payer: Self-pay | Admitting: Family Medicine

## 2016-10-28 ENCOUNTER — Ambulatory Visit (INDEPENDENT_AMBULATORY_CARE_PROVIDER_SITE_OTHER): Payer: BLUE CROSS/BLUE SHIELD | Admitting: Family Medicine

## 2016-10-28 VITALS — BP 110/80 | HR 72 | Resp 16 | Ht 70.0 in | Wt 214.0 lb

## 2016-10-28 DIAGNOSIS — I5043 Acute on chronic combined systolic (congestive) and diastolic (congestive) heart failure: Secondary | ICD-10-CM

## 2016-10-28 DIAGNOSIS — IMO0002 Reserved for concepts with insufficient information to code with codable children: Secondary | ICD-10-CM

## 2016-10-28 DIAGNOSIS — Z125 Encounter for screening for malignant neoplasm of prostate: Secondary | ICD-10-CM

## 2016-10-28 DIAGNOSIS — E785 Hyperlipidemia, unspecified: Secondary | ICD-10-CM | POA: Diagnosis not present

## 2016-10-28 DIAGNOSIS — E1065 Type 1 diabetes mellitus with hyperglycemia: Secondary | ICD-10-CM

## 2016-10-28 DIAGNOSIS — Z1211 Encounter for screening for malignant neoplasm of colon: Secondary | ICD-10-CM | POA: Diagnosis not present

## 2016-10-28 DIAGNOSIS — E1049 Type 1 diabetes mellitus with other diabetic neurological complication: Secondary | ICD-10-CM

## 2016-10-28 DIAGNOSIS — I5022 Chronic systolic (congestive) heart failure: Secondary | ICD-10-CM

## 2016-10-28 DIAGNOSIS — J309 Allergic rhinitis, unspecified: Secondary | ICD-10-CM

## 2016-10-28 DIAGNOSIS — Z0000 Encounter for general adult medical examination without abnormal findings: Secondary | ICD-10-CM | POA: Diagnosis not present

## 2016-10-28 LAB — POC HEMOCCULT BLD/STL (OFFICE/1-CARD/DIAGNOSTIC): FECAL OCCULT BLD: NEGATIVE

## 2016-10-28 MED ORDER — AZELASTINE HCL 0.1 % NA SOLN: 2.0000 | Freq: Two times a day (BID) | NASAL | 12 refills | Status: DC

## 2016-10-28 NOTE — Assessment & Plan Note (Signed)

## 2016-10-28 NOTE — Patient Instructions (Addendum)
F/u in 3 month, call if you need me before  pls make appt with eye Doctor  Astelin nasal spray for daily use is sent in , this will help all allergy symptoms  Not smoking, not drinking alcohol, limiting salt and restricting total fluid intake to 2 liters per day will reduce risk of acute heart failure  PSA, fasting lipid , cmp and EGFR past due , needed asap  Microalb today   Heart Failure Heart failure means your heart has trouble pumping blood. This makes it hard for your body to work well. Heart failure is usually a long-term (chronic) condition. You must take good care of yourself and follow your doctor's treatment plan. HOME CARE  Take your heart medicine as told by your doctor.  Do not stop taking medicine unless your doctor tells you to.  Do not skip any dose of medicine.  Refill your medicines before they run out.  Take other medicines only as told by your doctor or pharmacist.  Stay active if told by your doctor. The elderly and people with severe heart failure should talk with a doctor about physical activity.  Eat heart-healthy foods. Choose foods that are without trans fat and are low in saturated fat, cholesterol, and salt (sodium). This includes fresh or frozen fruits and vegetables, fish, lean meats, fat-free or low-fat dairy foods, whole grains, and high-fiber foods. Lentils and dried peas and beans (legumes) are also good choices.  Limit salt if told by your doctor.  Cook in a healthy way. Roast, grill, broil, bake, poach, steam, or stir-fry foods.  Limit fluids as told by your doctor.  Weigh yourself every morning. Do this after you pee (urinate) and before you eat breakfast. Write down your weight to give to your doctor.  Take your blood pressure and write it down if your doctor tells you to.  Ask your doctor how to check your pulse. Check your pulse as told.  Lose weight if told by your doctor.  Stop smoking or chewing tobacco. Do not use gum or  patches that help you quit without your doctor's approval.  Schedule and go to doctor visits as told.  Nonpregnant women should have no more than 1 drink a day. Men should have no more than 2 drinks a day. Talk to your doctor about drinking alcohol.  Stop illegal drug use.  Stay current with shots (immunizations).  Manage your health conditions as told by your doctor.  Learn to manage your stress.  Rest when you are tired.  If it is really hot outside:  Avoid intense activities.  Use air conditioning or fans, or get in a cooler place.  Avoid caffeine and alcohol.  Wear loose-fitting, lightweight, and light-colored clothing.  If it is really cold outside:  Avoid intense activities.  Layer your clothing.  Wear mittens or gloves, a hat, and a scarf when going outside.  Avoid alcohol.  Learn about heart failure and get support as needed.  Get help to maintain or improve your quality of life and your ability to care for yourself as needed. GET HELP IF:   You gain weight quickly.  You are more short of breath than usual.  You cannot do your normal activities.  You tire easily.  You cough more than normal, especially with activity.  You have any or more puffiness (swelling) in areas such as your hands, feet, ankles, or belly (abdomen).  You cannot sleep because it is hard to breathe.  You feel like  your heart is beating fast (palpitations).  You get dizzy or light-headed when you stand up. GET HELP RIGHT AWAY IF:   You have trouble breathing.  There is a change in mental status, such as becoming less alert or not being able to focus.  You have chest pain or discomfort.  You faint. MAKE SURE YOU:   Understand these instructions.  Will watch your condition.  Will get help right away if you are not doing well or get worse. This information is not intended to replace advice given to you by your health care provider. Make sure you discuss any questions  you have with your health care provider. Document Released: 09/08/2008 Document Revised: 12/21/2014 Document Reviewed: 01/16/2013 Elsevier Interactive Patient Education  2017 [employer].

## 2016-10-29 ENCOUNTER — Other Ambulatory Visit (HOSPITAL_COMMUNITY)
Admission: RE | Admit: 2016-10-29 | Discharge: 2016-10-29 | Disposition: A | Discharge status: Home / Self Care | Payer: BLUE CROSS/BLUE SHIELD | Source: Ambulatory Visit | Attending: Family Medicine | Admitting: Family Medicine

## 2016-10-29 DIAGNOSIS — E1065 Type 1 diabetes mellitus with hyperglycemia: Secondary | ICD-10-CM | POA: Insufficient documentation

## 2016-10-29 DIAGNOSIS — E1049 Type 1 diabetes mellitus with other diabetic neurological complication: Secondary | ICD-10-CM | POA: Insufficient documentation

## 2016-10-30 LAB — MICROALBUMIN / CREATININE URINE RATIO
Creatinine, Urine: 48.1 mg/dL
Microalb Creat Ratio: <6.2 mg/g creat (ref 0.0–30.0)
Microalb, Ur: <3 ug/mL — ABNORMAL HIGH

## 2016-11-01 DIAGNOSIS — I5022 Chronic systolic (congestive) heart failure: Secondary | ICD-10-CM | POA: Insufficient documentation

## 2016-11-01 NOTE — Assessment & Plan Note (Signed)
currently asymptomatic following recent hospitalization for decompensation and hypoxia, pt re educated re necessary lifestyle changes to control heart failure, apprx 10 mins counselling done to pt and spouse

## 2016-11-01 NOTE — Progress Notes (Signed)
Antonio Cobb     MRN: [id number]      DOB: 07-11-1961   HPI: Patient is in for annual physical exam. Heart failure education and review of recent hospitalization for this is reviewed. Unfortunately, pt and his wife still demonstrate a marked lack of knowledge gap, 10 mins spent in re education C/o increased and uncontrolled allergy symptoms with nasal congestion and excessive clear watery drainage Recent labs, if available are reviewed. Immunization is reviewed , and  updated if needed.    PE; Pleasant male, alert and oriented x 3, in no cardio-pulmonary distress. Afebrile. HEENT No facial trauma or asymetry. Sinuses non tender. EOMI. Nasal mucosa erythematous and edematous External ears normal, tympanic membranes clear. Oropharynx moist, no exudate. Neck: supple, no adenopathy,JVD or thyromegaly.No bruits.  Chest: Clear to ascultation bilaterally.No crackles or wheezes. Non tender to palpation  Breast: No asymetry,no masses. No nipple discharge or inversion. No axillary or supraclavicular adenopathy  Cardiovascular system; Heart sounds normal,  S1 and  S2 ,no S3.  No murmur, or thrill. Apical beat not displaced Peripheral pulses normal.  Abdomen: Soft, non tender, no organomegaly or masses. No bruits. Bowel sounds normal. No guarding, tenderness or rebound.  Rectal:  Normal sphincter tone. No hemorrhoids or  masses. guaiac negative stool. Prostate smooth and firm    Musculoskeletal exam: Full ROM of spine, hips , shoulders and knees. No deformity ,swelling or crepitus noted. No muscle wasting or atrophy.   Neurologic: Cranial nerves 2 to 12 intact. Power, tone ,sensation and reflexes normal throughout. No disturbance in gait. No tremor.  Skin: Intact, no ulceration, erythema , scaling or rash noted. Pigmentation normal throughout  Psych; Normal mood and affect. Judgement and concentration normal   Assessment & Plan:  Annual physical  exam Annual exam as documented. Counseling done  re healthy lifestyle involving commitment to 150 minutes exercise per week, heart healthy diet, and attaining healthy weight.The importance of adequate sleep also discussed. Regular seat belt use and home safety, is also discussed. Changes in health habits are decided on by the patient with goals and time frames  set for achieving them. Immunization and cancer screening needs are specifically addressed at this visit.   Acute on chronic combined systolic and diastolic heart failure (HCC) currently asymptomatic following recent hospitalization for decompensation and hypoxia, pt re educated re necessary lifestyle changes to control heart failure, apprx 10 mins counselling done to pt and spouse  Allergic rhinitis Uncontrolled symptoms, start astelin  Diabetes mellitus, insulin dependent (IDDM), uncontrolled ([place]) Uncontrolled, managed by endo Antonio Cobb is reminded of the importance of commitment to daily physical activity for 30 minutes or more, as able and the need to limit carbohydrate intake to 30 to 60 grams per meal to help with blood sugar control.   The need to take medication as prescribed, test blood sugar as directed, and to call between visits if there is a concern that blood sugar is uncontrolled is also discussed.   Antonio Cobb is reminded of the importance of daily foot exam, annual eye examination, and good blood sugar, blood pressure and cholesterol control.  Diabetic Labs Latest Ref Rng & Units 10/28/2016 10/09/2016 10/08/2016 10/07/2016 09/28/2016  HbA1c <5.7 % - - - - -  Microalbumin Not Estab. ug/mL <3.0(H) - - - -  Micro/Creat Ratio 0.0 - 30.0 mg/g creat <6.2 - - - -  Chol 125 - 200 mg/dL - - - - -  HDL >=40 mg/dL - - - - -  Calc LDL <130 mg/dL - - - - -  Triglycerides <150 mg/dL - - - - -  Creatinine 0.61 - 1.24 mg/dL - 1.00 1.18 1.41(H) 1.29   BP/Weight 10/28/2016 10/23/2016 10/09/2016 09/28/2016 06/17/2016  [id number]  Systolic BP A999333 [id number] [id number] [id number]  Diastolic BP 80 78 85 72 76 74 85  Wt. (Lbs) 214 210 205.25 206 209 203 209  BMI 30.71 30.13 30.31 29.56 29.99 29.13 29.99   Foot/eye exam completion dates Latest Ref Rng & Units 10/28/2016 10/10/2015  Eye Exam No Retinopathy - No Retinopathy  Foot Form Completion - Done -

## 2016-11-01 NOTE — Assessment & Plan Note (Signed)
Uncontrolled, managed by endo Antonio Cobb is reminded of the importance of commitment to daily physical activity for 30 minutes or more, as able and the need to limit carbohydrate intake to 30 to 60 grams per meal to help with blood sugar control.   The need to take medication as prescribed, test blood sugar as directed, and to call between visits if there is a concern that blood sugar is uncontrolled is also discussed.   Antonio Cobb is reminded of the importance of daily foot exam, annual eye examination, and good blood sugar, blood pressure and cholesterol control.  Diabetic Labs Latest Ref Rng & Units 10/28/2016 10/09/2016 10/08/2016 10/07/2016 09/28/2016  HbA1c <5.7 % - - - - -  Microalbumin Not Estab. ug/mL <3.0(H) - - - -  Micro/Creat Ratio 0.0 - 30.0 mg/g creat <6.2 - - - -  Chol 125 - 200 mg/dL - - - - -  HDL >=40 mg/dL - - - - -  Calc LDL <130 mg/dL - - - - -  Triglycerides <150 mg/dL - - - - -  Creatinine 0.61 - 1.24 mg/dL - 1.00 1.18 1.41(H) 1.29   BP/Weight 10/28/2016 10/23/2016 10/09/2016 09/28/2016 06/17/2016 [id number]  Systolic BP A999333 A999333 A999333 [id number]  Diastolic BP 80 78 85 72 76 74 85  Wt. (Lbs) 214 210 205.25 206 209 203 209  BMI 30.71 30.13 30.31 29.56 29.99 29.13 29.99   Foot/eye exam completion dates Latest Ref Rng & Units 10/28/2016 10/10/2015  Eye Exam No Retinopathy - No Retinopathy  Foot Form Completion - Done -

## 2016-11-01 NOTE — Assessment & Plan Note (Signed)
Uncontrolled symptoms, start astelin

## 2016-11-13 ENCOUNTER — Ambulatory Visit: Payer: BLUE CROSS/BLUE SHIELD | Admitting: Adult Health

## 2016-11-13 ENCOUNTER — Encounter: Payer: Self-pay | Admitting: Family Medicine

## 2016-12-03 ENCOUNTER — Other Ambulatory Visit: Payer: Self-pay | Admitting: Family Medicine

## 2016-12-29 ENCOUNTER — Encounter: Payer: Self-pay | Admitting: Adult Health

## 2016-12-29 ENCOUNTER — Ambulatory Visit: Payer: BLUE CROSS/BLUE SHIELD | Admitting: Adult Health

## 2016-12-29 NOTE — Progress Notes (Deleted)
Cardiology Office Note   Date:  12/29/2016   ID:  Antonio Cobb, DOB 1961/06/13, MRN [id number]  PCP:  Tula Nakayama, MD  Cardiologist: Cloria Spring, NP   No chief complaint on file.     History of Present Illness: Antonio Cobb is a 56 y.o. male who presents for ongoing assessment and management of nonischemic cardiomyopathy, alcohol abuse, most recent ejection fraction was 30-35%, history of CAD with stent to the mid circumflex, hypertension, hyperlipidemia, with history of COPD.  Most recent cardiac catheterization in June 2017:   Patent stent in the mid circumflex.  Moderate disease in the RCA and LAD.  Mild pulmonary hypertension.  Cardiac output 5.1 L/m. Cardiac index 2.4  Pulmonary artery saturation 66%.  He was last seen in the office on 10/23/2016 he was started on samples of Entresto, he was to follow-up in one month for close follow-up. He is advised to connect with Alcoholics Anonymous.  Past Medical History:  Diagnosis Date  . Alcohol abuse   . CAD (coronary artery disease)    DES to circumflex June 2013  . Cardiomyopathy (HCC)    LVEF 30-35%  . COPD (chronic obstructive pulmonary disease) ([place])   . Diabetes mellitus, type 2 ([place])   . Essential hypertension   . Mitral regurgitation    Moderate  . Mixed hyperlipidemia   . Myocardial infarction 05/23/2012   Inferolateral STEMI  . Obesity     Past Surgical History:  Procedure Laterality Date  . CARDIAC CATHETERIZATION  2 yrs ago  . CARDIAC CATHETERIZATION N/A 05/15/2016   Procedure: Right/Left Heart Cath and Coronary Angiography;  Surgeon: [name], MD;  Location: [facility];  Service: Cardiovascular;  Laterality: N/A;  . CORONARY STENT PLACEMENT  05/23/12  . LEFT HEART CATHETERIZATION WITH CORONARY ANGIOGRAM N/A 05/23/2012   Procedure: LEFT HEART CATHETERIZATION WITH CORONARY ANGIOGRAM;  Surgeon: [name], MD;  Location: [facility] CATH LAB;  Service:  Cardiovascular;  Laterality: N/A;  . None    . PERCUTANEOUS CORONARY STENT INTERVENTION (PCI-S) N/A 05/23/2012   Procedure: PERCUTANEOUS CORONARY STENT INTERVENTION (PCI-S);  Surgeon: [name], MD;  Location: [facility] CATH LAB;  Service: Cardiovascular;  Laterality: N/A;  . POLYPECTOMY  10/16/2011   Procedure: POLYPECTOMY;  Surgeon: [name], MD;  Location: [facility];  Service: Endoscopy;;  Polypoid Lesion, Transverse and Sigmoid Colon     Current Outpatient Prescriptions  Medication Sig Dispense Refill  . albuterol (PROVENTIL HFA;VENTOLIN HFA) 108 (90 Base) MCG/ACT inhaler Two puffs before exercise , as needed, for  wheezing (Patient taking differently: Inhale 2 puffs into the lungs as needed for wheezing. Two puffs before exercise , as needed, for  wheezing) 18 g 2  . amLODipine (NORVASC) 10 MG tablet take 1 tablet by mouth once daily 90 tablet 1  . aspirin (ASPIRIN ADULT LOW STRENGTH) 81 MG EC tablet Take 81 mg by mouth daily.      [name] atorvastatin (LIPITOR) 40 MG tablet Take 1 tablet (40 mg total) by mouth daily. 90 tablet 1  . azelastine (ASTELIN) 0.1 % nasal spray Place 2 sprays into both nostrils 2 (two) times daily. Use in each nostril as directed 30 mL 12  . furosemide (LASIX) 40 MG tablet Take 1 tablet (40 mg total) by mouth daily. 90 tablet 1  . glucose blood test strip Four times daily testing dx [name] 150 each 1  . insulin aspart (NOVOLOG) 100 UNIT/ML injection Inject 15 Units into the skin 3 (  three) times daily before meals. Sliding scale begins at 15 units and increases as CBG dictates (Patient taking differently: Inject 10-30 Units into the skin 3 (three) times daily before meals. Sliding scale per home instructions) 10 mL 0  . Insulin Glargine (LANTUS SOLOSTAR) 100 UNIT/ML Solostar Pen Inject 60 Units into the skin daily at 10 pm. Everynight    . ipratropium-albuterol (DUONEB) 0.5-2.5 (3) MG/3ML SOLN Take 3 mLs by nebulization every 6 (six) hours as needed. (Patient taking  differently: Take 3 mLs by nebulization every 6 (six) hours as needed (for wheezing or shortness of breath). ) 360 mL 0  . Lancets (FREESTYLE) lancets Four times daily testing dx e11.65 150 each 1  . metoprolol succinate (TOPROL-XL) 50 MG 24 hr tablet take 1 tablet once daily with food 90 tablet 1  . nitroGLYCERIN (NITROSTAT) 0.4 MG SL tablet Place 1 tablet (0.4 mg total) under the tongue every 5 (five) minutes as needed for chest pain. 25 tablet 3  . potassium chloride SA (KLOR-CON M20) 20 MEQ tablet Take 1 tablet (20 mEq total) by mouth daily. 90 tablet 3  . sacubitril-valsartan (ENTRESTO) 49-51 MG Take 1 tablet by mouth 2 (two) times daily. 60 tablet 0  . SPIRIVA HANDIHALER 18 MCG inhalation capsule inhale the contents of one capsule in the handihaler once daily 30 capsule 0  . spironolactone (ALDACTONE) 25 MG tablet Take 1 tablet (25 mg total) by mouth daily. 90 tablet 1  . temazepam (RESTORIL) 7.5 MG capsule Take 1 capsule (7.5 mg total) by mouth at bedtime as needed for sleep. 30 capsule 3   No current facility-administered medications for this visit.     Allergies:   Patient has no known allergies.    Social History:  The patient  reports that he quit smoking about 7 months ago. His smoking use included Cigars. He has a 30.00 pack-year smoking history. He has never used smokeless tobacco. He reports that he drinks alcohol. He reports that he does not use drugs.   Family History:  The patient's family history includes Asthma in his sister; COPD in his father and mother; Diabetes in his mother; Emphysema in his father and mother; Heart attack in his mother; Heart disease in his mother; Hypertension in his mother; Stroke in his sister.    ROS: All other systems are reviewed and negative. Unless otherwise mentioned in H&P    PHYSICAL EXAM: VS:  There were no vitals taken for this visit. , BMI There is no height or weight on file to calculate BMI. GEN: Well nourished, well developed, in  no acute distress HEENT: normal Neck: no JVD, carotid bruits, or masses Cardiac: ***RRR; no murmurs, rubs, or gallops,no edema  Respiratory:  clear to auscultation bilaterally, normal work of breathing GI: soft, nontender, nondistended, + BS MS: no deformity or atrophy Skin: warm and dry, no rash Neuro:  Strength and sensation are intact Psych: euthymic mood, full affect   EKG:  EKG {ACTION; IS/IS [id number] ordered today. The ekg ordered today demonstrates ***   Recent Labs: 03/12/2016: TSH 1.38 10/07/2016: ALT 57; B Natriuretic Peptide 374.0; Hemoglobin 15.8; Platelets 283 10/09/2016: BUN 24; Creatinine, Ser 1.00; Potassium 3.8; Sodium 136    Lipid Panel    Component Value Date/Time   CHOL 165 03/12/2016 1111   TRIG 309 (H) 03/12/2016 1111   HDL 33 (L) 03/12/2016 1111   CHOLHDL 5.0 03/12/2016 1111   VLDL 62 (H) 03/12/2016 1111   LDLCALC 70 03/12/2016 1111  LDLDIRECT TEST NOT PERFORMED 06/19/2011 1230      Wt Readings from Last 3 Encounters:  10/28/16 214 lb (97.1 kg)  10/23/16 210 lb (95.3 kg)  10/09/16 205 lb 4 oz (93.1 kg)      Other studies Reviewed: Echocardiogram Oct 15, 2016 Left ventricle: The cavity size was mildly dilated. Wall thickness was increased in a pattern of mild LVH. Systolic function was moderately to severely reduced. The estimated ejection fraction was in the range of 30% to 35%. There is akinesis of the inferolateral and inferior myocardium. Doppler parameters are consistent with restrictive physiology, indicative of decreased left ventricular diastolic compliance and/or increased left atrial pressure. - Aortic valve: Mildly calcified annulus. Trileaflet. There was trivial regurgitation. - Mitral valve: Mildly thickened leaflets . There was moderate regurgitation directed posteriorly. - Left atrium: The atrium was mildly dilated. - Right atrium: Central venous pressure (est): 3 mm Hg. - Tricuspid valve: There was  mild regurgitation. - Pulmonary arteries: PA peak pressure: 36 mm Hg (S). - Pericardium, extracardiac: There was no pericardial effusion ASSESSMENT AND PLAN:  1.  ***   Current medicines are reviewed at length with the patient today.    Labs/ tests ordered today include: *** No orders of the defined types were placed in this encounter.    Disposition:   FU with *** in {gen number [id number] {TIME; UNITS DAY/WEEK/MONTH:19136}   Signed, Jory Sims, NP  12/29/2016 7:17 AM    [place]. [address] [place],  [address] Phone: [phone]; Fax: [phone]

## 2017-01-11 ENCOUNTER — Telehealth: Payer: Self-pay | Admitting: Family Medicine

## 2017-01-11 ENCOUNTER — Other Ambulatory Visit: Payer: Self-pay

## 2017-01-11 MED ORDER — FUROSEMIDE 40 MG PO TABS: 40.0000 mg | ORAL_TABLET | Freq: Every day | ORAL | 0 refills | Status: DC

## 2017-01-11 NOTE — Telephone Encounter (Signed)
[place] is asking for a refill on Lasix sent to [facility] in [place] advise?

## 2017-01-11 NOTE — Telephone Encounter (Signed)
Medication refilled

## 2017-01-12 ENCOUNTER — Other Ambulatory Visit: Payer: Self-pay | Admitting: Cardiology

## 2017-01-25 ENCOUNTER — Encounter: Payer: Self-pay | Admitting: Family Medicine

## 2017-01-25 ENCOUNTER — Ambulatory Visit: Payer: BLUE CROSS/BLUE SHIELD | Admitting: Family Medicine

## 2017-01-27 ENCOUNTER — Ambulatory Visit: Payer: BLUE CROSS/BLUE SHIELD | Admitting: Family Medicine

## 2017-02-09 ENCOUNTER — Encounter: Payer: Self-pay | Admitting: Family Medicine

## 2017-02-09 ENCOUNTER — Ambulatory Visit (INDEPENDENT_AMBULATORY_CARE_PROVIDER_SITE_OTHER): Payer: BLUE CROSS/BLUE SHIELD | Admitting: Family Medicine

## 2017-02-09 VITALS — BP 120/70 | HR 98 | Temp 99.7°F | Resp 18 | Ht 70.0 in | Wt 208.0 lb

## 2017-02-09 DIAGNOSIS — I10 Essential (primary) hypertension: Secondary | ICD-10-CM

## 2017-02-09 DIAGNOSIS — E1049 Type 1 diabetes mellitus with other diabetic neurological complication: Secondary | ICD-10-CM

## 2017-02-09 DIAGNOSIS — E785 Hyperlipidemia, unspecified: Secondary | ICD-10-CM

## 2017-02-09 DIAGNOSIS — Z125 Encounter for screening for malignant neoplasm of prostate: Secondary | ICD-10-CM

## 2017-02-09 DIAGNOSIS — I5042 Chronic combined systolic (congestive) and diastolic (congestive) heart failure: Secondary | ICD-10-CM

## 2017-02-09 DIAGNOSIS — K529 Noninfective gastroenteritis and colitis, unspecified: Secondary | ICD-10-CM

## 2017-02-09 DIAGNOSIS — J209 Acute bronchitis, unspecified: Secondary | ICD-10-CM | POA: Diagnosis not present

## 2017-02-09 DIAGNOSIS — J440 Chronic obstructive pulmonary disease with acute lower respiratory infection: Secondary | ICD-10-CM | POA: Diagnosis not present

## 2017-02-09 DIAGNOSIS — E1065 Type 1 diabetes mellitus with hyperglycemia: Secondary | ICD-10-CM | POA: Diagnosis not present

## 2017-02-09 DIAGNOSIS — IMO0002 Reserved for concepts with insufficient information to code with codable children: Secondary | ICD-10-CM

## 2017-02-09 MED ORDER — PROMETHAZINE-DM 6.25-15 MG/5ML PO SYRP: ORAL_SOLUTION | ORAL | 0 refills | Status: DC

## 2017-02-09 MED ORDER — LEVOFLOXACIN 500 MG PO TABS: 500.0000 mg | ORAL_TABLET | Freq: Every day | ORAL | 0 refills | Status: DC

## 2017-02-09 MED ORDER — BENZONATATE 100 MG PO CAPS: 100.0000 mg | ORAL_CAPSULE | Freq: Two times a day (BID) | ORAL | 0 refills | Status: DC | PRN

## 2017-02-09 NOTE — Progress Notes (Signed)
Antonio Cobb     MRN: [id number]      DOB: [birth year]   HPI Antonio Cobb is here with a 1 day h/o loose watery stool over 5 today, spouse was sick last week. Has nausea 3 week h/o cough, not much sputum and intermittent chills. Denies sinus pressure, nasal drainage, sore throat or ear pain ROS Denies chest pains, palpitations and leg swelling Denies abdominal pain, nausea, vomiting,diarrhea or constipation.   Denies dysuria, frequency, hesitancy or incontinence. Denies joint pain, swelling and limitation in mobility. Denies headaches, seizures, numbness, or tingling. Denies depression, anxiety or insomnia. Denies skin break down or rash.   PE  BP (!) 142/64   Pulse 98   Temp 99.7 F (37.6 C)   Resp 18   Ht 5\' 10"  (1.778 m)   Wt 208 lb (94.3 kg)   SpO2 96%   BMI 29.84 kg/m   Patient alert and oriented ill appearing.  HEENT: No facial asymmetry, EOMI,   oropharynx pink and moist.  Neck supple no JVD, no mass.TM clear, no sinus tenderness, no cervical adenopathy  Chest: decreased air entry though adequate, scattered bibasilar Crackles and few wheezes  CVS: S1, S2 no murmurs, no S3.Regular rate.  ABD: Soft hyperactive BS, superficial tenderness in epigastrium  Ext: No edema  MS: Adequate ROM spine, shoulders, hips and knees.  Skin: Intact, no ulcerations or rash noted.  Psych: Good eye contact, normal affect. Memory intact not anxious or depressed appearing.  CNS: CN 2-12 intact, power,  normal throughout.no focal deficits noted.   Assessment & Plan  Acute bronchitis with COPD ([place]) 3 week h/o cough and dyspnea, CXR, decongestant, antibiotic , cough suppressant and work excuse  Chronic combined systolic and diastolic heart failure (HCC) Clinically stable, asymptomatic with normal exam., reports reduced alcohol intake   Essential hypertension Controlled, no change in medication DASH diet and commitment to daily physical activity for a minimum of 30  minutes discussed and encouraged, as a part of hypertension management. The importance of attaining a healthy weight is also discussed.  BP/Weight 02/09/2017 10/28/2016 10/23/2016 10/09/2016 09/28/2016 06/17/2016 [id number]  Systolic BP 123456 [id number] A999333 A999333 [id number]  Diastolic BP 70 80 78 85 72 76 74  Wt. (Lbs) 208 214 210 205.25 206 209 203  BMI 29.84 30.71 30.13 30.31 29.56 29.99 29.13       Hyperlipidemia LDL goal <70 Hyperlipidemia:Low fat diet discussed and encouraged.   Lipid Panel  Lab Results  Component Value Date   CHOL 140 02/09/2017   HDL 26 (L) 02/09/2017   LDLCALC 87 02/09/2017   LDLDIRECT TEST NOT PERFORMED 06/19/2011   TRIG 133 02/09/2017   CHOLHDL 5.4 (H) 02/09/2017   Not at goal needs to increase exercise and reduce fat intake    Gastroenteritis, acute Less than 24 hour mildly symptomatic, supportive treatment only  Diabetes mellitus, insulin dependent (IDDM), uncontrolled (HCC) Deteriorated, needs to make and keep f/u with endo Antonio Cobb is reminded of the importance of commitment to daily physical activity for 30 minutes or more, as able and the need to limit carbohydrate intake to 30 to 60 grams per meal to help with blood sugar control.   The need to take medication as prescribed, test blood sugar as directed, and to call between visits if there is a concern that blood sugar is uncontrolled is also discussed.   Antonio Cobb is reminded of the importance of daily foot exam, annual eye examination, and good blood sugar,  blood pressure and cholesterol control.  Diabetic Labs Latest Ref Rng & Units 02/09/2017 10/28/2016 10/09/2016 10/08/2016 10/07/2016  HbA1c <5.7 % 10.4(H) - - - -  Microalbumin Not Estab. ug/mL - <3.0(H) - - -  Micro/Creat Ratio 0.0 - 30.0 mg/g creat - <6.2 - - -  Chol <200 mg/dL 140 - - - -  HDL >40 mg/dL 26(L) - - - -  Calc LDL <100 mg/dL 87 - - - -  Triglycerides <150 mg/dL 133 - - - -  Creatinine 0.70 - 1.33 mg/dL 1.01 - 1.00 1.18  1.41(H)   BP/Weight 02/09/2017 10/28/2016 10/23/2016 10/09/2016 09/28/2016 06/17/2016 [id number]  Systolic BP 123456 A999333 A999333 A999333 [id number]  Diastolic BP 70 80 78 85 72 76 74  Wt. (Lbs) 208 214 210 205.25 206 209 203  BMI 29.84 30.71 30.13 30.31 29.56 29.99 29.13   Foot/eye exam completion dates Latest Ref Rng & Units 10/28/2016 10/10/2015  Eye Exam No Retinopathy - No Retinopathy  Foot Form Completion - Done -

## 2017-02-09 NOTE — Patient Instructions (Addendum)
F/u in 3 month, call if you need me before  CXR today  Fasting lipid, cmp and EGFR, hBA1C, cBC and PSA in the morning    Use peptobismol if needed for loose stool  Keep hydrated with water   You are treated for chronic bronchitis , take meds as directed  CONGRATS on no smoke   Work excuse from today return  This Friday

## 2017-02-10 ENCOUNTER — Ambulatory Visit (HOSPITAL_COMMUNITY)
Admission: RE | Admit: 2017-02-10 | Discharge: 2017-02-10 | Disposition: A | Discharge status: Home / Self Care | Payer: BLUE CROSS/BLUE SHIELD | Source: Ambulatory Visit | Attending: Family Medicine | Admitting: Family Medicine

## 2017-02-10 DIAGNOSIS — J209 Acute bronchitis, unspecified: Secondary | ICD-10-CM

## 2017-02-10 DIAGNOSIS — J440 Chronic obstructive pulmonary disease with acute lower respiratory infection: Secondary | ICD-10-CM | POA: Insufficient documentation

## 2017-02-10 LAB — HEMOGLOBIN A1C
Hgb A1c MFr Bld: 10.4 % — ABNORMAL HIGH (ref <5.7)
Mean Plasma Glucose: 252 mg/dL

## 2017-02-10 LAB — PSA: PSA: 0.4 ng/mL (ref <=4.0)

## 2017-02-11 LAB — COMPLETE METABOLIC PANEL WITH GFR
ALBUMIN: 3.5 g/dL — AB (ref 3.6–5.1)
ALK PHOS: 84 U/L (ref 40–115)
ALT: 23 U/L (ref 9–46)
AST: 24 U/L (ref 10–35)
BILIRUBIN TOTAL: 0.8 mg/dL (ref 0.2–1.2)
BUN: 16 mg/dL (ref 7–25)
CO2: 23 mmol/L (ref 20–31)
CREATININE: 1.01 mg/dL (ref 0.70–1.33)
Calcium: 9.1 mg/dL (ref 8.6–10.3)
Chloride: 100 mmol/L (ref 98–110)
GFR, EST NON AFRICAN AMERICAN: 83 mL/min (ref >=60)
GFR, Est African American: >89 mL/min (ref >=60)
GLUCOSE: 133 mg/dL — AB (ref 65–99)
Potassium: 4.3 mmol/L (ref 3.5–5.3)
SODIUM: 138 mmol/L (ref 135–146)
Total Protein: 6.8 g/dL (ref 6.1–8.1)

## 2017-02-11 LAB — LIPID PANEL
CHOLESTEROL: 140 mg/dL (ref <200)
HDL: 26 mg/dL — AB (ref >40)
LDL CALC: 87 mg/dL (ref <100)
TRIGLYCERIDES: 133 mg/dL (ref <150)
Total CHOL/HDL Ratio: 5.4 Ratio — ABNORMAL HIGH (ref <5.0)
VLDL: 27 mg/dL (ref <30)

## 2017-02-16 DIAGNOSIS — K529 Noninfective gastroenteritis and colitis, unspecified: Secondary | ICD-10-CM | POA: Insufficient documentation

## 2017-02-16 NOTE — Assessment & Plan Note (Signed)
3 week h/o cough and dyspnea, CXR, decongestant, antibiotic , cough suppressant and work excuse

## 2017-02-16 NOTE — Assessment & Plan Note (Signed)
Less than 24 hour mildly symptomatic, supportive treatment only

## 2017-02-16 NOTE — Assessment & Plan Note (Signed)
Hyperlipidemia:Low fat diet discussed and encouraged.   Lipid Panel  Lab Results  Component Value Date   CHOL 140 02/09/2017   HDL 26 (L) 02/09/2017   LDLCALC 87 02/09/2017   LDLDIRECT TEST NOT PERFORMED 06/19/2011   TRIG 133 02/09/2017   CHOLHDL 5.4 (H) 02/09/2017   Not at goal needs to increase exercise and reduce fat intake

## 2017-02-16 NOTE — Assessment & Plan Note (Signed)
Deteriorated, needs to make and keep f/u with endo Antonio Cobb is reminded of the importance of commitment to daily physical activity for 30 minutes or more, as able and the need to limit carbohydrate intake to 30 to 60 grams per meal to help with blood sugar control.   The need to take medication as prescribed, test blood sugar as directed, and to call between visits if there is a concern that blood sugar is uncontrolled is also discussed.   Antonio Cobb is reminded of the importance of daily foot exam, annual eye examination, and good blood sugar, blood pressure and cholesterol control.  Diabetic Labs Latest Ref Rng & Units 02/09/2017 10/28/2016 10/09/2016 10/08/2016 10/07/2016  HbA1c <5.7 % 10.4(H) - - - -  Microalbumin Not Estab. ug/mL - <3.0(H) - - -  Micro/Creat Ratio 0.0 - 30.0 mg/g creat - <6.2 - - -  Chol <200 mg/dL 140 - - - -  HDL >40 mg/dL 26(L) - - - -  Calc LDL <100 mg/dL 87 - - - -  Triglycerides <150 mg/dL 133 - - - -  Creatinine 0.70 - 1.33 mg/dL 1.01 - 1.00 1.18 1.41(H)   BP/Weight 02/09/2017 10/28/2016 10/23/2016 10/09/2016 09/28/2016 06/17/2016 [id number]  Systolic BP 123456 A999333 A999333 A999333 [id number]  Diastolic BP 70 80 78 85 72 76 74  Wt. (Lbs) 208 214 210 205.25 206 209 203  BMI 29.84 30.71 30.13 30.31 29.56 29.99 29.13   Foot/eye exam completion dates Latest Ref Rng & Units 10/28/2016 10/10/2015  Eye Exam No Retinopathy - No Retinopathy  Foot Form Completion - Done -

## 2017-02-16 NOTE — Assessment & Plan Note (Signed)
Clinically stable, asymptomatic with normal exam., reports reduced alcohol intake

## 2017-02-16 NOTE — Assessment & Plan Note (Signed)
Controlled, no change in medication DASH diet and commitment to daily physical activity for a minimum of 30 minutes discussed and encouraged, as a part of hypertension management. The importance of attaining a healthy weight is also discussed.  BP/Weight 02/09/2017 10/28/2016 10/23/2016 10/09/2016 09/28/2016 06/17/2016 [id number]  Systolic BP 123456 A999333 A999333 A999333 [id number]  Diastolic BP 70 80 78 85 72 76 74  Wt. (Lbs) 208 214 210 205.25 206 209 203  BMI 29.84 30.71 30.13 30.31 29.56 29.99 29.13

## 2017-02-25 ENCOUNTER — Telehealth: Payer: Self-pay | Admitting: Family Medicine

## 2017-02-25 NOTE — Telephone Encounter (Signed)
Antonio Cobb is c/o cough being worse, it left for a short time but came right back worse and he is asking for something to take, please advise?

## 2017-02-26 ENCOUNTER — Other Ambulatory Visit: Payer: Self-pay | Admitting: Family Medicine

## 2017-02-26 MED ORDER — BUDESONIDE-FORMOTEROL FUMARATE 80-4.5 MCG/ACT IN AERO: 2.0000 | INHALATION_SPRAY | Freq: Two times a day (BID) | RESPIRATORY_TRACT | 3 refills | Status: DC

## 2017-02-26 MED ORDER — ALBUTEROL SULFATE HFA 108 (90 BASE) MCG/ACT IN AERS: 2.0000 | INHALATION_SPRAY | Freq: Four times a day (QID) | RESPIRATORY_TRACT | 0 refills | Status: DC | PRN

## 2017-02-26 NOTE — Telephone Encounter (Signed)
lmtcb

## 2017-02-26 NOTE — Telephone Encounter (Signed)
Pls let him know I have sent in 2 inhalers, as I believe the cough is from smoking/ COPD,  Please also offer and give him if he wishes an appt with me in the next 1 to 2 weeks for further eval of chronic cough

## 2017-03-03 ENCOUNTER — Telehealth: Payer: Self-pay | Admitting: Family Medicine

## 2017-03-03 NOTE — Telephone Encounter (Signed)
Antonio Cobb still has uncontrollable cough per his wife, [name].  They wanted Dr. Moshe Cipro to look at his medications and see if this is a side effect of his medications and let them know.  Thanks

## 2017-03-04 ENCOUNTER — Telehealth: Payer: Self-pay | Admitting: Adult Health

## 2017-03-04 NOTE — Telephone Encounter (Signed)
Will forward to Jory Sims, N.P.

## 2017-03-04 NOTE — Telephone Encounter (Signed)
noted 

## 2017-03-04 NOTE — Telephone Encounter (Signed)
Patient states that [name] is causing him to cough and dizzy spells. / tg

## 2017-03-04 NOTE — Telephone Encounter (Signed)
Please advise 

## 2017-03-04 NOTE — Telephone Encounter (Signed)
He states he stopped the entrestro and the cough has almost stopped. Told him to make cardiology aware so it can be replaced with another heart failure medication. He is going by there right now

## 2017-03-04 NOTE — Telephone Encounter (Signed)
No he is not, I recommend return to pulmonary for further eval and lung function testing, he has seen Dr Luan Pulling in the past, pls let me know so I can refer

## 2017-03-05 NOTE — Telephone Encounter (Signed)
Spoke with pt, he will come in for BP check on 03/08/17 @ 4:00

## 2017-03-08 ENCOUNTER — Ambulatory Visit (INDEPENDENT_AMBULATORY_CARE_PROVIDER_SITE_OTHER): Payer: BLUE CROSS/BLUE SHIELD | Admitting: *Deleted

## 2017-03-08 VITALS — BP 106/82 | HR 104

## 2017-03-08 DIAGNOSIS — I10 Essential (primary) hypertension: Secondary | ICD-10-CM

## 2017-03-08 NOTE — Progress Notes (Signed)
Patient states that he has been coughing over a month that was caused by the [facility]. Patient stopped taking over a week ago. Patient states that he is no longer dizzy. Pt's PCP called in Spiriva, Albuterol inhaler, and Symbicort on Friday 3/23. Please advise

## 2017-03-09 ENCOUNTER — Telehealth: Payer: Self-pay | Admitting: *Deleted

## 2017-03-09 MED ORDER — LOSARTAN POTASSIUM 25 MG PO TABS: 12.5000 mg | ORAL_TABLET | Freq: Every day | ORAL | 6 refills | Status: DC

## 2017-03-09 NOTE — Telephone Encounter (Signed)
-----   Message from Arnoldo Lenis, MD sent at 03/09/2017  9:45 AM EDT -----   ----- Message ----- From: Levonne Hubert, LPN Sent: 0/11/2240   4:36 PM To: Arnoldo Lenis, MD

## 2017-03-09 NOTE — Progress Notes (Signed)
Ok to stop entresto. Looks like he had been on an ARB alone in the past and did ok with, have him start losartan 12.5mg  daily and update [place] in 1 week on symptoms   J Eleana Tocco MD

## 2017-03-09 NOTE — Telephone Encounter (Signed)
Patient notified medication changes. Pt voiced understanding

## 2017-03-12 ENCOUNTER — Other Ambulatory Visit: Payer: Self-pay | Admitting: Family Medicine

## 2017-05-12 ENCOUNTER — Encounter: Payer: Self-pay | Admitting: Family Medicine

## 2017-05-12 ENCOUNTER — Ambulatory Visit (INDEPENDENT_AMBULATORY_CARE_PROVIDER_SITE_OTHER): Payer: BLUE CROSS/BLUE SHIELD | Admitting: Family Medicine

## 2017-05-12 VITALS — BP 136/86 | HR 92 | Temp 97.7°F | Resp 18 | Ht 70.0 in | Wt 209.0 lb

## 2017-05-12 DIAGNOSIS — Z720 Tobacco use: Secondary | ICD-10-CM | POA: Diagnosis not present

## 2017-05-12 DIAGNOSIS — F1010 Alcohol abuse, uncomplicated: Secondary | ICD-10-CM

## 2017-05-12 DIAGNOSIS — I10 Essential (primary) hypertension: Secondary | ICD-10-CM | POA: Diagnosis not present

## 2017-05-12 DIAGNOSIS — E1049 Type 1 diabetes mellitus with other diabetic neurological complication: Secondary | ICD-10-CM | POA: Diagnosis not present

## 2017-05-12 DIAGNOSIS — E785 Hyperlipidemia, unspecified: Secondary | ICD-10-CM | POA: Diagnosis not present

## 2017-05-12 DIAGNOSIS — I428 Other cardiomyopathies: Secondary | ICD-10-CM | POA: Diagnosis not present

## 2017-05-12 DIAGNOSIS — E1065 Type 1 diabetes mellitus with hyperglycemia: Secondary | ICD-10-CM | POA: Diagnosis not present

## 2017-05-12 DIAGNOSIS — IMO0002 Reserved for concepts with insufficient information to code with codable children: Secondary | ICD-10-CM

## 2017-05-12 MED ORDER — AMLODIPINE BESYLATE 10 MG PO TABS: 10.0000 mg | ORAL_TABLET | Freq: Every day | ORAL | 1 refills | Status: DC

## 2017-05-12 MED ORDER — METOPROLOL SUCCINATE ER 50 MG PO TB24: ORAL_TABLET | ORAL | 1 refills | Status: DC

## 2017-05-12 MED ORDER — ATORVASTATIN CALCIUM 40 MG PO TABS: 40.0000 mg | ORAL_TABLET | Freq: Every day | ORAL | 1 refills | Status: DC

## 2017-05-12 MED ORDER — POTASSIUM CHLORIDE CRYS ER 20 MEQ PO TBCR: 20.0000 meq | EXTENDED_RELEASE_TABLET | Freq: Every day | ORAL | 1 refills | Status: DC

## 2017-05-12 MED ORDER — FUROSEMIDE 40 MG PO TABS: 40.0000 mg | ORAL_TABLET | Freq: Every day | ORAL | 1 refills | Status: DC

## 2017-05-12 MED ORDER — SPIRONOLACTONE 25 MG PO TABS: 25.0000 mg | ORAL_TABLET | Freq: Every day | ORAL | 1 refills | Status: DC

## 2017-05-12 MED ORDER — TIOTROPIUM BROMIDE MONOHYDRATE 18 MCG IN CAPS: ORAL_CAPSULE | RESPIRATORY_TRACT | 1 refills | Status: DC

## 2017-05-12 NOTE — Patient Instructions (Addendum)
F/u in 4 month, call if you need me before  Fasting lipid, cmp and EGFR, HBA1c this week  You need to see Dr Nida  No MORE smoking or alcohol  It is important that you exercise regularly at least 30 minutes 5 times a week. If you develop chest pain, have severe difficulty breathing, or feel very tired, stop exercising immediately and seek medical attention   Thank you  for choosing [facility]. We consider it a privelige to serve you.  Delivering excellent health care in a caring and  compassionate way is our goal.  Partnering with you,  so that together we can achieve this goal is our strategy.     

## 2017-05-16 ENCOUNTER — Encounter: Payer: Self-pay | Admitting: Family Medicine

## 2017-05-16 NOTE — Assessment & Plan Note (Signed)
Sub optimal control, no med change, lifestyle change encouraged,  DASH diet and commitment to daily physical activity for a minimum of 30 minutes discussed and encouraged, as a part of hypertension management. The importance of attaining a healthy weight is also discussed.  BP/Weight 05/12/2017 03/08/2017 02/09/2017 10/28/2016 10/23/2016 10/09/2016 89/84/2103  Systolic BP 128 118 867 737 366 815 947  Diastolic BP 86 82 70 80 78 85 72  Wt. (Lbs) 209 - 208 214 210 205.25 206  BMI 29.99 - 29.84 30.71 30.13 30.31 29.56

## 2017-05-16 NOTE — Progress Notes (Signed)
Antonio Cobb     MRN: [id number]      DOB: [birth year]   HPI Antonio Cobb is here for follow up and re-evaluation of chronic medical conditions, medication management and review of any available recent lab and radiology data.  Preventive health is updated, specifically  Cancer screening and Immunization.   Still as not re established with endo, and is not testing blood sugars regularly, requests handicap sticker for SOB States drinking is better and smoking less, realizes the ned to quit both, but remains in denial as to the gravity of hi condition, unfortunate wife who is supportive is also in denial The PT denies any adverse reactions to current medications since the last visit.  ROS Denies recent fever or chills. Denies sinus pressure, nasal congestion, ear pain or sore throat. Denies chest congestion, productive cough or wheezing. Denies chest pains, palpitations and leg swelling Denies abdominal pain, nausea, vomiting,diarrhea or constipation.   Denies dysuria, frequency, hesitancy or incontinence. Denies joint pain, swelling and limitation in mobility. Denies headaches, seizures, numbness, or tingling. Denies depression, anxiety or insomnia. Denies skin break down or rash.   PE  BP 136/86 (BP Location: Left Arm, Patient Position: Sitting, Cuff Size: Large)   Pulse 92   Temp 97.7 F (36.5 C) (Temporal)   Resp 18   Ht 5\' 10"  (1.778 m)   Wt 209 lb (94.8 kg)   SpO2 94%   BMI 29.99 kg/m   Patient alert and oriented and in no cardiopulmonary distress.  HEENT: No facial asymmetry, EOMI,   oropharynx pink and moist.  Neck supple no JVD, no mass.  Chest: Clear to auscultation bilaterally.Decreased air entry througout  CVS: S1, S2 no murmurs, no S3.Regular rate.  ABD: Soft non tender.   Ext: No edema  MS: Adequate ROM spine, shoulders, hips and knees.  Skin: Intact, no ulcerations or rash noted.  Psych: Good eye contact, normal affect. Memory intact not anxious  or depressed appearing.  CNS: CN 2-12 intact, power,  normal throughout.no focal deficits noted.   Assessment & Plan  Diabetes mellitus, insulin dependent (IDDM), uncontrolled ([place]) Antonio Cobb is reminded of the importance of commitment to daily physical activity for 30 minutes or more, as able and the need to limit carbohydrate intake to 30 to 60 grams per meal to help with blood sugar control.   The need to take medication as prescribed, test blood sugar as directed, and to call between visits if there is a concern that blood sugar is uncontrolled is also discussed.   Antonio Cobb is reminded of the importance of daily foot exam, annual eye examination, and good blood sugar, blood pressure and cholesterol control.  Diabetic Labs Latest Ref Rng & Units 02/09/2017 10/28/2016 10/09/2016 10/08/2016 10/07/2016  HbA1c <5.7 % 10.4(H) - - - -  Microalbumin Not Estab. ug/mL - <3.0(H) - - -  Micro/Creat Ratio 0.0 - 30.0 mg/g creat - <6.2 - - -  Chol <200 mg/dL 140 - - - -  HDL >40 mg/dL 26(L) - - - -  Calc LDL <100 mg/dL 87 - - - -  Triglycerides <150 mg/dL 133 - - - -  Creatinine 0.70 - 1.33 mg/dL 1.01 - 1.00 1.18 1.41(H)   BP/Weight 05/12/2017 03/08/2017 02/09/2017 10/28/2016 10/23/2016 10/09/2016 63/14/9702  Systolic BP 637 858 850 277 412 878 676  Diastolic BP 86 82 70 80 78 85 72  Wt. (Lbs) 209 - 208 214 210 205.25 206  BMI 29.99 - 29.84 30.71  30.13 30.31 29.56   Foot/eye exam completion dates Latest Ref Rng & Units 10/28/2016 10/10/2015  Eye Exam No Retinopathy - No Retinopathy  Foot Form Completion - Done -      Updated lab needed refer to endo again  Tobacco abuse Patient is asked and  confirms current  Nicotine use.  Five to seven minutes of time is spent in counseling the patient of the need to quit smoking  Advice to quit is delivered clearly specifically in reducing the risk of developing heart disease, having a stroke, or of developing all types of cancer, especially  lung and oral cancer. Improvement in breathing and exercise tolerance and quality of life is also discussed, as is the economic benefit.This pt already has CAD and COPD, his need to [place] is emergent ! Assessment of willingness to quit or to make an attempt to quit is made and documented  Assistance in quit attempt is made with several and varied options presented, based on patient's desire and need. These include  literature, local classes available, 1800 QUIT NOW number, OTC and prescription medication.  The GOAL to be NICOTINE FREE is re emphasized.  The patient has set a personal goal of either reduction or discontinuation and follow up is arranged between 6 an 16 weeks.    NICM (nonischemic cardiomyopathy) ([place]) C/o exertional dyspnea and difficulty accessing buildings at times, appropriate for handicap sticker with multiple co morbidities affecting both heart and lungs  Essential hypertension Sub optimal control, no med change, lifestyle change encouraged,  DASH diet and commitment to daily physical activity for a minimum of 30 minutes discussed and encouraged, as a part of hypertension management. The importance of attaining a healthy weight is also discussed.  BP/Weight 05/12/2017 03/08/2017 02/09/2017 10/28/2016 10/23/2016 10/09/2016 35/36/1443  Systolic BP 154 008 676 195 093 267 124  Diastolic BP 86 82 70 80 78 85 72  Wt. (Lbs) 209 - 208 214 210 205.25 206  BMI 29.99 - 29.84 30.71 30.13 30.31 29.56       Alcohol abuse Counseled pt re need to quit alcohol use as this is adversely affecting his heart for approximately 5 minutes in the presence of his wife, no interest in attending outpatient treatment   Hyperlipidemia LDL goal <70 Hyperlipidemia:Low fat diet discussed and encouraged.   Lipid Panel  Lab Results  Component Value Date   CHOL 140 02/09/2017   HDL 26 (L) 02/09/2017   LDLCALC 87 02/09/2017   LDLDIRECT TEST NOT PERFORMED 06/19/2011   TRIG 133 02/09/2017    CHOLHDL 5.4 (H) 02/09/2017  Low HDL increasing CV risk, encouraged to commit to regular physical activity as tolerated

## 2017-05-16 NOTE — Assessment & Plan Note (Signed)
C/o exertional dyspnea and difficulty accessing buildings at times, appropriate for handicap sticker with multiple co morbidities affecting both heart and lungs

## 2017-05-16 NOTE — Assessment & Plan Note (Signed)
Patient is asked and  confirms current  Nicotine use.  Five to seven minutes of time is spent in counseling the patient of the need to quit smoking  Advice to quit is delivered clearly specifically in reducing the risk of developing heart disease, having a stroke, or of developing all types of cancer, especially lung and oral cancer. Improvement in breathing and exercise tolerance and quality of life is also discussed, as is the economic benefit.This pt already has CAD and COPD, his need to [place] is emergent ! Assessment of willingness to quit or to make an attempt to quit is made and documented  Assistance in quit attempt is made with several and varied options presented, based on patient's desire and need. These include  literature, local classes available, 1800 QUIT NOW number, OTC and prescription medication.  The GOAL to be NICOTINE FREE is re emphasized.  The patient has set a personal goal of either reduction or discontinuation and follow up is arranged between 6 an 16 weeks.

## 2017-05-16 NOTE — Assessment & Plan Note (Signed)
Counseled pt re need to quit alcohol use as this is adversely affecting his heart for approximately 5 minutes in the presence of his wife, no interest in attending outpatient treatment

## 2017-05-16 NOTE — Assessment & Plan Note (Signed)
Antonio Cobb is reminded of the importance of commitment to daily physical activity for 30 minutes or more, as able and the need to limit carbohydrate intake to 30 to 60 grams per meal to help with blood sugar control.   The need to take medication as prescribed, test blood sugar as directed, and to call between visits if there is a concern that blood sugar is uncontrolled is also discussed.   Antonio Cobb is reminded of the importance of daily foot exam, annual eye examination, and good blood sugar, blood pressure and cholesterol control.  Diabetic Labs Latest Ref Rng & Units 02/09/2017 10/28/2016 10/09/2016 10/08/2016 10/07/2016  HbA1c <5.7 % 10.4(H) - - - -  Microalbumin Not Estab. ug/mL - <3.0(H) - - -  Micro/Creat Ratio 0.0 - 30.0 mg/g creat - <6.2 - - -  Chol <200 mg/dL 140 - - - -  HDL >40 mg/dL 26(L) - - - -  Calc LDL <100 mg/dL 87 - - - -  Triglycerides <150 mg/dL 133 - - - -  Creatinine 0.70 - 1.33 mg/dL 1.01 - 1.00 1.18 1.41(H)   BP/Weight 05/12/2017 03/08/2017 02/09/2017 10/28/2016 10/23/2016 10/09/2016 89/21/1941  Systolic BP 740 814 481 856 314 970 263  Diastolic BP 86 82 70 80 78 85 72  Wt. (Lbs) 209 - 208 214 210 205.25 206  BMI 29.99 - 29.84 30.71 30.13 30.31 29.56   Foot/eye exam completion dates Latest Ref Rng & Units 10/28/2016 10/10/2015  Eye Exam No Retinopathy - No Retinopathy  Foot Form Completion - Done -      Updated lab needed refer to endo again

## 2017-05-16 NOTE — Assessment & Plan Note (Signed)
Hyperlipidemia:Low fat diet discussed and encouraged.   Lipid Panel  Lab Results  Component Value Date   CHOL 140 02/09/2017   HDL 26 (L) 02/09/2017   LDLCALC 87 02/09/2017   LDLDIRECT TEST NOT PERFORMED 06/19/2011   TRIG 133 02/09/2017   CHOLHDL 5.4 (H) 02/09/2017  Low HDL increasing CV risk, encouraged to commit to regular physical activity as tolerated

## 2017-05-25 ENCOUNTER — Ambulatory Visit (INDEPENDENT_AMBULATORY_CARE_PROVIDER_SITE_OTHER): Payer: BLUE CROSS/BLUE SHIELD | Admitting: Family Medicine

## 2017-05-25 ENCOUNTER — Encounter: Payer: Self-pay | Admitting: Family Medicine

## 2017-05-25 ENCOUNTER — Ambulatory Visit (HOSPITAL_COMMUNITY)
Admission: RE | Admit: 2017-05-25 | Discharge: 2017-05-25 | Disposition: A | Discharge status: Home / Self Care | Payer: BLUE CROSS/BLUE SHIELD | Source: Ambulatory Visit | Attending: Family Medicine | Admitting: Family Medicine

## 2017-05-25 VITALS — BP 100/70 | HR 90 | Temp 97.7°F | Resp 20 | Ht 70.0 in | Wt 209.0 lb

## 2017-05-25 DIAGNOSIS — F1010 Alcohol abuse, uncomplicated: Secondary | ICD-10-CM | POA: Diagnosis not present

## 2017-05-25 DIAGNOSIS — E1029 Type 1 diabetes mellitus with other diabetic kidney complication: Secondary | ICD-10-CM | POA: Diagnosis not present

## 2017-05-25 DIAGNOSIS — R0602 Shortness of breath: Secondary | ICD-10-CM

## 2017-05-25 DIAGNOSIS — I878 Other specified disorders of veins: Secondary | ICD-10-CM | POA: Insufficient documentation

## 2017-05-25 DIAGNOSIS — I5043 Acute on chronic combined systolic (congestive) and diastolic (congestive) heart failure: Secondary | ICD-10-CM

## 2017-05-25 DIAGNOSIS — R05 Cough: Secondary | ICD-10-CM | POA: Diagnosis not present

## 2017-05-25 DIAGNOSIS — E1065 Type 1 diabetes mellitus with hyperglycemia: Secondary | ICD-10-CM | POA: Diagnosis not present

## 2017-05-25 DIAGNOSIS — Z720 Tobacco use: Secondary | ICD-10-CM

## 2017-05-25 DIAGNOSIS — R053 Chronic cough: Secondary | ICD-10-CM

## 2017-05-25 DIAGNOSIS — IMO0002 Reserved for concepts with insufficient information to code with codable children: Secondary | ICD-10-CM

## 2017-05-25 NOTE — Progress Notes (Signed)
   Antonio Cobb     MRN: [id number]      DOB: 1961-04-28   HPI Antonio Cobb is here   With a 1 week h/o shortness of breath inability to lie flat , can't lie in bed and sleep, cough and wheeze Wife also states he has bee coughing up bloody sputum  ROS Denies recent fever or chills. Denies sinus pressure, nasal congestion, ear pain or sore throat. Denies chest congestion, productive cough or wheezing. Denies chest pains, palpitations c/o constipation , at times stool is hard and and leg swelling Feels full in stomach and "tight" at times stool is hard and in balls   PE  BP 100/70 (BP Location: Left Arm, Patient Position: Sitting, Cuff Size: Large)   Pulse 90   Temp 97.7 F (36.5 C) (Temporal)   Resp 20   Ht 5\' 10"  (1.778 m)   Wt 209 lb (94.8 kg)   SpO2 95%   BMI 29.99 kg/m   Patient alert and oriented and in no cardiopulmonary distress.  HEENT: No facial asymmetry, EOMI,   oropharynx pink and moist.  Neck supple no JVD, no mass.  Chest: adequate iir entry , few basilar crackles, no wheezes  CVS: S1, S2 no murmurs, no S3.Regular rate.  ABD: Soft non tender.   Ext: No edema  MS: Adequate ROM spine, shoulders, hips and knees.  Skin: Intact, no ulcerations or rash noted.  Psych: Good eye contact, normal affect. Memory intact not anxious or depressed appearing.  CNS: CN 2-12 intact, power,  normal throughout.no focal deficits noted.   Assessment & Plan  Acute on chronic combined systolic and diastolic heart failure (HCC) One week h/o of symptoms of decompensation, pt to increase lasix dose for next 3 days, he is off work tomorrow CXR today Advised salt restriction D/c smoking D/c alcohol. Needs close cardiology follow up also  Diabetes mellitus, insulin dependent (IDDM), uncontrolled ([place]) Remains uncontrolled , pt despite being advised to schedule with endo has still not done this and remains inconsistent as far as diet and medication adherence are  concerned  Tobacco abuse Patient is asked and  confirms current  Nicotine use.  Five to seven minutes of time is spent in counseling the patient of the need to quit smoking  Advice to quit is delivered clearly specifically in reducing the risk of developing heart disease, having a stroke, or of developing all types of cancer, especially lung and oral cancer. Improvement in breathing and exercise tolerance and quality of life is also discussed, as is the economic benefit.  Assessment of willingness to quit or to make an attempt to quit is made and documented  Assistance in quit attempt is made with several and varied options presented, based on patient's desire and need. These include  literature, local classes available, 1800 QUIT NOW number, OTC and prescription medication.  The GOAL to be NICOTINE FREE is re emphasized.currently has CAD,  And COPD, needs to QUIT  The patient has set a personal goal of either reduction or discontinuation and follow up is arranged between 6 an 16 weeks.    Alcohol abuse counseled re need to quit as this continues to damage heart and also makes diabetes control impossible

## 2017-05-25 NOTE — Patient Instructions (Signed)
F/u as before  You NEED to use spiriva and symbicort EVERY day to help breathing Albuterol is just for rescue  Increase lasix to twice daily for the next 3 days, symptoms suggest early heart failure  You need a cXR and today, and I am referring you to cardiology for follow up   I am also referring you to Dr Luan Pulling about your breathing  You need to commit to two to 4 stool softeners , colace every day since stool is hard  You need to make and keep appt with dr Dorris Fetch

## 2017-05-27 ENCOUNTER — Telehealth: Payer: Self-pay | Admitting: Family Medicine

## 2017-05-27 ENCOUNTER — Encounter: Payer: Self-pay | Admitting: "Endocrinology

## 2017-05-27 ENCOUNTER — Encounter: Payer: Self-pay | Admitting: Family Medicine

## 2017-05-27 DIAGNOSIS — R05 Cough: Secondary | ICD-10-CM | POA: Insufficient documentation

## 2017-05-27 DIAGNOSIS — R058 Other specified cough: Secondary | ICD-10-CM | POA: Insufficient documentation

## 2017-05-27 NOTE — Assessment & Plan Note (Signed)
Remains uncontrolled , pt despite being advised to schedule with endo has still not done this and remains inconsistent as far as diet and medication adherence are concerned

## 2017-05-27 NOTE — Assessment & Plan Note (Signed)
One week h/o of symptoms of decompensation, pt to increase lasix dose for next 3 days, he is off work tomorrow CXR today Advised salt restriction D/c smoking D/c alcohol. Needs close cardiology follow up also

## 2017-05-27 NOTE — Telephone Encounter (Signed)
Tried contacting pt to enquire as to how he is doing and left him a message to call me , then spoke with his wife as he is at work, also to let him know that hsi cXR suggested mild heart failure and that he has been referred oi cartdiology on urgent basis

## 2017-05-27 NOTE — Assessment & Plan Note (Signed)
Patient is asked and  confirms current  Nicotine use.  Five to seven minutes of time is spent in counseling the patient of the need to quit smoking  Advice to quit is delivered clearly specifically in reducing the risk of developing heart disease, having a stroke, or of developing all types of cancer, especially lung and oral cancer. Improvement in breathing and exercise tolerance and quality of life is also discussed, as is the economic benefit.  Assessment of willingness to quit or to make an attempt to quit is made and documented  Assistance in quit attempt is made with several and varied options presented, based on patient's desire and need. These include  literature, local classes available, 1800 QUIT NOW number, OTC and prescription medication.  The GOAL to be NICOTINE FREE is re emphasized.currently has CAD,  And COPD, needs to QUIT  The patient has set a personal goal of either reduction or discontinuation and follow up is arranged between 6 an 16 weeks.

## 2017-05-27 NOTE — Assessment & Plan Note (Signed)
counseled re need to quit as this continues to damage heart and also makes diabetes control impossible

## 2017-06-11 ENCOUNTER — Emergency Department (HOSPITAL_COMMUNITY): Payer: BLUE CROSS/BLUE SHIELD

## 2017-06-11 ENCOUNTER — Emergency Department (HOSPITAL_COMMUNITY)
Admission: EM | Admit: 2017-06-11 | Discharge: 2017-06-11 | Disposition: A | Discharge status: Home / Self Care | Payer: BLUE CROSS/BLUE SHIELD | Attending: Emergency Medicine | Admitting: Emergency Medicine

## 2017-06-11 ENCOUNTER — Encounter (HOSPITAL_COMMUNITY): Payer: Self-pay | Admitting: Emergency Medicine

## 2017-06-11 DIAGNOSIS — J449 Chronic obstructive pulmonary disease, unspecified: Secondary | ICD-10-CM | POA: Insufficient documentation

## 2017-06-11 DIAGNOSIS — Z7982 Long term (current) use of aspirin: Secondary | ICD-10-CM | POA: Diagnosis not present

## 2017-06-11 DIAGNOSIS — I5043 Acute on chronic combined systolic (congestive) and diastolic (congestive) heart failure: Secondary | ICD-10-CM | POA: Diagnosis not present

## 2017-06-11 DIAGNOSIS — M1611 Unilateral primary osteoarthritis, right hip: Secondary | ICD-10-CM

## 2017-06-11 DIAGNOSIS — I110 Hypertensive heart disease with heart failure: Secondary | ICD-10-CM | POA: Insufficient documentation

## 2017-06-11 DIAGNOSIS — E119 Type 2 diabetes mellitus without complications: Secondary | ICD-10-CM | POA: Diagnosis not present

## 2017-06-11 DIAGNOSIS — M47896 Other spondylosis, lumbar region: Secondary | ICD-10-CM | POA: Diagnosis not present

## 2017-06-11 DIAGNOSIS — F17290 Nicotine dependence, other tobacco product, uncomplicated: Secondary | ICD-10-CM | POA: Insufficient documentation

## 2017-06-11 DIAGNOSIS — Z794 Long term (current) use of insulin: Secondary | ICD-10-CM | POA: Diagnosis not present

## 2017-06-11 DIAGNOSIS — I2510 Atherosclerotic heart disease of native coronary artery without angina pectoris: Secondary | ICD-10-CM | POA: Insufficient documentation

## 2017-06-11 DIAGNOSIS — M545 Low back pain: Secondary | ICD-10-CM | POA: Diagnosis present

## 2017-06-11 LAB — BASIC METABOLIC PANEL
ANION GAP: 10 (ref 5–15)
BUN: 21 mg/dL — AB (ref 6–20)
CHLORIDE: 105 mmol/L (ref 101–111)
CO2: 25 mmol/L (ref 22–32)
Calcium: 10.1 mg/dL (ref 8.9–10.3)
Creatinine, Ser: 1.1 mg/dL (ref 0.61–1.24)
GFR calc Af Amer: >60 mL/min (ref >60)
GFR calc non Af Amer: >60 mL/min (ref >60)
GLUCOSE: 195 mg/dL — AB (ref 65–99)
POTASSIUM: 3.8 mmol/L (ref 3.5–5.1)
Sodium: 140 mmol/L (ref 135–145)

## 2017-06-11 MED ORDER — HYDROCODONE-ACETAMINOPHEN 5-325 MG PO TABS: 1.0000 | ORAL_TABLET | ORAL | 0 refills | Status: DC | PRN

## 2017-06-11 MED ORDER — METHOCARBAMOL 500 MG PO TABS: 500.0000 mg | ORAL_TABLET | Freq: Three times a day (TID) | ORAL | 0 refills | Status: DC

## 2017-06-11 MED ORDER — MELOXICAM 15 MG PO TABS: 15.0000 mg | ORAL_TABLET | Freq: Every day | ORAL | 0 refills | Status: DC

## 2017-06-11 NOTE — ED Provider Notes (Signed)
[facility] DEPT Provider Note   CSN: [id number] Arrival date & time: 06/11/17  1552     History   Chief Complaint Chief Complaint  Patient presents with  . Back Pain    HPI Antonio Cobb is a 56 y.o. male.  Patient is a 56 year old male who presents to the emergency department with a complaint of lower back pain that radiates down the left leg. Patient states that he has had some problems with his back from time to time, but he can usually walk it off. On yesterday he began to have pain that would start in his back and go down the left leg. He states that the pain started getting better after he walked for a while. He had the problem again today and it took more and more time for the pain to go away. He denies any loss of bowel or bladder function. He's not had any injury or trauma to the back or to the hip or to the leg on the left. There's been no procedures involving the back or leg. He presents now for assistance and assessment of this problem.      Past Medical History:  Diagnosis Date  . Alcohol abuse   . CAD (coronary artery disease)    DES to circumflex June 2013  . Cardiomyopathy (HCC)    LVEF 30-35%  . COPD (chronic obstructive pulmonary disease) ([place])   . Diabetes mellitus, type 2 ([place])   . Essential hypertension   . Mitral regurgitation    Moderate  . Mixed hyperlipidemia   . Myocardial infarction ([place]) 05/23/2012   Inferolateral STEMI  . Obesity     Patient Active Problem List   Diagnosis Date Noted  . Chronic cough 05/27/2017  . Prolonged QT interval 10/07/2016  . Coronary artery disease involving coronary bypass graft of native heart with angina pectoris ([place])   . Abnormal echocardiogram   . Acute on chronic combined systolic and diastolic heart failure ([place])   . Allergic rhinitis 05/06/2015  . NICM (nonischemic cardiomyopathy) ([place]) 05/23/2012  . Tobacco abuse 05/23/2012  . Alcohol abuse 05/23/2012  . Arteriosclerotic cardiovascular  disease (ASCVD) 05/23/2012  . ED (erectile dysfunction) 05/10/2012  . Overweight 08/12/2010  . Diabetes mellitus, insulin dependent (IDDM), uncontrolled ([place]) 03/08/2008  . Hyperlipidemia LDL goal <70 03/08/2008  . Essential hypertension 03/08/2008    Past Surgical History:  Procedure Laterality Date  . CARDIAC CATHETERIZATION  2 yrs ago  . CARDIAC CATHETERIZATION N/A 05/15/2016   Procedure: Right/Left Heart Cath and Coronary Angiography;  Surgeon: [name], MD;  Location: [facility] LAB;  Service: Cardiovascular;  Laterality: N/A;  . CORONARY STENT PLACEMENT  05/23/12  . LEFT HEART CATHETERIZATION WITH CORONARY ANGIOGRAM N/A 05/23/2012   Procedure: LEFT HEART CATHETERIZATION WITH CORONARY ANGIOGRAM;  Surgeon: [name], MD;  Location: [facility] CATH LAB;  Service: Cardiovascular;  Laterality: N/A;  . None    . PERCUTANEOUS CORONARY STENT INTERVENTION (PCI-S) N/A 05/23/2012   Procedure: PERCUTANEOUS CORONARY STENT INTERVENTION (PCI-S);  Surgeon: [name], MD;  Location: [facility] CATH LAB;  Service: Cardiovascular;  Laterality: N/A;  . POLYPECTOMY  10/16/2011   Procedure: POLYPECTOMY;  Surgeon: [name], MD;  Location: [facility];  Service: Endoscopy;;  Polypoid Lesion, Transverse and Sigmoid Colon       Home Medications    Prior to Admission medications   Medication Sig Start Date End Date Taking? Authorizing Provider  albuterol (PROVENTIL HFA;VENTOLIN HFA) 108 (90 Base) MCG/ACT inhaler Inhale 2  puffs into the lungs every 6 (six) hours as needed for wheezing or shortness of breath. 02/26/17   [name], MD  amLODipine (NORVASC) 10 MG tablet Take 1 tablet (10 mg total) by mouth daily. 05/12/17   [name], MD  aspirin (ASPIRIN ADULT LOW STRENGTH) 81 MG EC tablet Take 81 mg by mouth daily.      [provider]  atorvastatin (LIPITOR) 40 MG tablet Take 1 tablet (40 mg total) by mouth daily. 05/12/17   [name], MD  budesonide-formoterol  (SYMBICORT) 80-4.5 MCG/ACT inhaler Inhale 2 puffs into the lungs 2 (two) times daily. 02/26/17   [name], MD  FREESTYLE LITE test strip TEST four times a day 03/15/17   [name], MD  furosemide (LASIX) 40 MG tablet Take 1 tablet (40 mg total) by mouth daily. 05/12/17   [name], MD  insulin aspart (NOVOLOG) 100 UNIT/ML injection Inject 15 Units into the skin 3 (three) times daily before meals. Sliding scale begins at 15 units and increases as CBG dictates Patient taking differently: Inject 10-30 Units into the skin 3 (three) times daily before meals. Sliding scale per home instructions 03/23/15   Orpah Greek, MD  Insulin Glargine (LANTUS SOLOSTAR) 100 UNIT/ML Solostar Pen Inject 60 Units into the skin daily at 10 pm. Everynight    [provider]  ipratropium-albuterol (DUONEB) 0.5-2.5 (3) MG/3ML SOLN Take 3 mLs by nebulization every 6 (six) hours as needed. Patient taking differently: Take 3 mLs by nebulization every 6 (six) hours as needed (for wheezing or shortness of breath).  04/27/16   [name], MD  Lancets (FREESTYLE) lancets Four times daily testing dx e11.65 03/12/16   [name], MD  losartan (COZAAR) 25 MG tablet Take 0.5 tablets (12.5 mg total) by mouth daily. 03/09/17 06/07/17  Arnoldo Lenis, MD  metoprolol succinate (TOPROL-XL) 50 MG 24 hr tablet take 1 tablet once daily with food 05/12/17   [name], MD  Multiple Vitamin (MULTIVITAMIN) capsule Take 1 capsule by mouth daily.    [provider]  nitroGLYCERIN (NITROSTAT) 0.4 MG SL tablet Place 1 tablet (0.4 mg total) under the tongue every 5 (five) minutes as needed for chest pain. 05/29/16 11/06/19  Lendon Colonel, NP  potassium chloride SA (KLOR-CON M20) 20 MEQ tablet Take 1 tablet (20 mEq total) by mouth daily. 05/12/17   [name], MD  spironolactone (ALDACTONE) 25 MG tablet Take 1 tablet (25 mg total) by mouth daily. 05/12/17   [name], MD  tiotropium (SPIRIVA HANDIHALER) 18 MCG inhalation capsule inhale the contents of one capsule in the handihaler once daily 05/12/17   [name], MD    Family History Family History  Problem Relation Age of Onset  . Emphysema Mother   . Heart attack Mother   . Hypertension Mother   . Diabetes Mother   . Heart disease Mother   . COPD Mother   . Emphysema Father   . COPD Father   . Stroke Sister   . Asthma Sister   . Colon cancer Neg Hx   . Anesthesia problems Neg Hx   . Hypotension Neg Hx   . Malignant hyperthermia Neg Hx   . Pseudochol deficiency Neg Hx     Social History Social History  Substance Use Topics  . Smoking status: Current Some Day Smoker    Packs/day: 0.25    Years: 30.00    Types:  Cigars    Last attempt to quit: 05/23/2016  . Smokeless tobacco: Never Used     Comment: since early 54s  . Alcohol use 0.0 oz/week     Comment: occ     Allergies   Patient has no known allergies.   Review of Systems Review of Systems  Constitutional: Negative for activity change.       All ROS Neg except as noted in HPI  HENT: Negative for nosebleeds.   Eyes: Negative for photophobia and discharge.  Respiratory: Negative for cough, shortness of breath and wheezing.   Cardiovascular: Negative for chest pain and palpitations.  Gastrointestinal: Negative for abdominal pain and blood in stool.  Genitourinary: Negative for dysuria, frequency and hematuria.  Musculoskeletal: Positive for arthralgias and back pain. Negative for neck pain.  Skin: Negative.   Neurological: Negative for dizziness, seizures and speech difficulty.  Psychiatric/Behavioral: Negative for confusion and hallucinations.     Physical Exam Updated Vital Signs BP (!) 144/97 (BP Location: Right Arm)   Pulse 98   Temp 97.7 F (36.5 C) (Oral)   Resp 18   Ht 5\' 10"  (1.778 m)   Wt 94.8 kg (209 lb)   SpO2 96%   BMI 29.99 kg/m   Physical Exam  Constitutional: He is oriented  to person, place, and time. He appears well-developed and well-nourished.  Non-toxic appearance.  HENT:  Head: Normocephalic.  Right Ear: Tympanic membrane and external ear normal.  Left Ear: Tympanic membrane and external ear normal.  Eyes: EOM and lids are normal. Pupils are equal, round, and reactive to light.  Neck: Normal range of motion. Neck supple. Carotid bruit is not present.  Cardiovascular: Normal rate, regular rhythm, normal heart sounds, intact distal pulses and normal pulses.   Pulmonary/Chest: Breath sounds normal. No respiratory distress.  Abdominal: Soft. Bowel sounds are normal. There is no tenderness. There is no guarding.  Musculoskeletal: Normal range of motion.  Pain with range of motion of the left hip on. No hot joints appreciated. No deformity appreciated of the left lower extremity.  There is no palpable step off of the lumbar spine. No hot areas of the lumbar spine. There is some crepitus with attempted range of motion.  Lymphadenopathy:       Head (right side): No submandibular adenopathy present.       Head (left side): No submandibular adenopathy present.    He has no cervical adenopathy.  Neurological: He is alert and oriented to person, place, and time. He has normal strength. No cranial nerve deficit or sensory deficit.  Gait is steady. No foot drop appreciated. No motor or sensory deficits appreciated.  Skin: Skin is warm and dry.  Psychiatric: He has a normal mood and affect. His speech is normal.  Nursing note and vitals reviewed.    ED Treatments / Results  Labs (all labs ordered are listed, but only abnormal results are displayed) Labs Reviewed  BASIC METABOLIC PANEL    EKG  EKG Interpretation None       Radiology Ct Lumbar Spine Wo Contrast  Result Date: 06/11/2017 CLINICAL DATA:  Back and left hip pain EXAM: CT LUMBAR SPINE WITHOUT CONTRAST TECHNIQUE: Multidetector CT imaging of the lumbar spine was performed without intravenous  contrast administration. Multiplanar CT image reconstructions were also generated. COMPARISON:  CT abdomen pelvis 03/08/2007 FINDINGS: Segmentation: There is transitional lumbosacral anatomy with a partially sacralized L5 vertebral body with bilateral assimilation joints. Alignment: Normal. Vertebrae: No acute fracture.  Large anterior osteophyte  at L3-L4. Paraspinal and other soft tissues: There is calcific aortic atherosclerosis. Disc levels: T12-L1: Normal disc space and facets. No spinal canal or neuroforaminal stenosis. L1-L2: Normal disc space and facets. No spinal canal or neuroforaminal stenosis. L2-L3: Normal disc space and facets. No spinal canal or neuroforaminal stenosis. L3-L4: Normal disc space and facets. No spinal canal or neuroforaminal stenosis. L4-L5: There is a small disc bulge with moderate right and severe left facet hypertrophy. Mild left foraminal stenosis. L5-S1: No disc herniation. No spinal canal or neuroforaminal stenosis. Visualized sacrum: Normal. IMPRESSION: 1. No acute abnormality of the lumbar spine. 2. Transitional lumbosacral anatomy. 3. No high-grade spinal canal or neural foraminal stenosis is visible. MRI is much more sensitive for evaluating small to moderate disc herniations and should be considered if clinically warranted. Electronically Signed   By: Ulyses Jarred M.D.   On: 06/11/2017 17:21   Dg Hip Unilat W Or Wo Pelvis 2-3 Views Left  Result Date: 06/11/2017 CLINICAL DATA:  Left hip pain.  Fell 3 weeks ago. EXAM: DG HIP (WITH OR WITHOUT PELVIS) 2-3V LEFT COMPARISON:  No recent prior. FINDINGS: Degenerative changes lumbar spine and both hips. No acute bony or joint abnormality identified. No evidence of fracture dislocation. IMPRESSION: Degenerative changes lumbar spine and both hips. No acute abnormality. Left hip is intact. Electronically Signed   By: Marcello Moores  Register   On: 06/11/2017 17:00    Procedures Procedures (including critical care time)  Medications  Ordered in ED Medications - No data to display   Initial Impression / Assessment and Plan / ED Course  I have reviewed the triage vital signs and the nursing notes.  Pertinent labs & imaging results that were available during my care of the patient were reviewed by me and considered in my medical decision making (see chart for details).      Final Clinical Impressions(s) / ED Diagnoses MDM Blood pressure is slightly elevated at 1 44/97. The remainder the vital signs within normal limits.  No gross neurologic deficit appreciated. CT scan of the lumbar spine is negative for acute fracture or insult to the lumbar spine. X-ray of the hip and pelvis show degenerative arthritis changes of the spine and the hip on the left.  The patient will be treated with Norco, mobile, and Robaxin. I've asked the patient to discuss the symptoms with Dr. Aline Brochure, and determine if an MR will be needed. Patient is in agreement with this plan.    Final diagnoses:  Primary osteoarthritis of right hip  Other osteoarthritis of spine, lumbar region    New Prescriptions New Prescriptions   HYDROCODONE-ACETAMINOPHEN (NORCO/VICODIN) 5-325 MG TABLET    Take 1 tablet by mouth every 4 (four) hours as needed.   MELOXICAM (MOBIC) 15 MG TABLET    Take 1 tablet (15 mg total) by mouth daily.   METHOCARBAMOL (ROBAXIN) 500 MG TABLET    Take 1 tablet (500 mg total) by mouth 3 (three) times daily.     Lily Kocher, PA-C 06/11/17 1805    Forde Dandy, MD 06/15/17 [id number]

## 2017-06-11 NOTE — ED Triage Notes (Signed)
PT c/o pain to lower back that radiates down his left leg and states he woke up with the pain yesterday morning with no new injuries.

## 2017-06-11 NOTE — Discharge Instructions (Signed)
Your CT scan is negative for acute findings related to your spinal cord. The x-rays show arthritis involving your hip and years lumbar spine area. Please use a heating pad to your back and hip area. Use Robaxin 3 times daily for spasm pain. Use Mobic daily with food. Use Norco for more severe pain. Norco and Robaxin may cause drowsiness, please use these medicines with caution. Please see Dr. Aline Brochure for additional evaluation, and also to assess if MRI might be needed.

## 2017-06-14 ENCOUNTER — Ambulatory Visit (INDEPENDENT_AMBULATORY_CARE_PROVIDER_SITE_OTHER): Payer: BLUE CROSS/BLUE SHIELD | Admitting: Family Medicine

## 2017-06-14 ENCOUNTER — Encounter: Payer: Self-pay | Admitting: Family Medicine

## 2017-06-14 VITALS — BP 118/76 | HR 90 | Temp 99.7°F | Resp 14 | Ht 70.0 in | Wt 211.2 lb

## 2017-06-14 DIAGNOSIS — Z09 Encounter for follow-up examination after completed treatment for conditions other than malignant neoplasm: Secondary | ICD-10-CM | POA: Diagnosis not present

## 2017-06-14 DIAGNOSIS — M5136 Other intervertebral disc degeneration, lumbar region: Secondary | ICD-10-CM | POA: Diagnosis not present

## 2017-06-14 DIAGNOSIS — I10 Essential (primary) hypertension: Secondary | ICD-10-CM

## 2017-06-14 NOTE — Patient Instructions (Signed)
F/u in October as before, call if you need me before  You had acute flare up of arthritis pain in your low back, so PLEASE RESPECT your back  Only medication for back pani is he muscle relaxant and tylenol if needed  STAY ALCOHOL, and also NICOTINE FREE   Keep all appts with specialists

## 2017-06-20 ENCOUNTER — Encounter: Payer: Self-pay | Admitting: Family Medicine

## 2017-06-20 DIAGNOSIS — M5136 Other intervertebral disc degeneration, lumbar region: Secondary | ICD-10-CM | POA: Insufficient documentation

## 2017-06-20 NOTE — Assessment & Plan Note (Signed)
Resolution of acute sciatica, pt counseled re need to avoid over use/ abuse of lumbar spine, may use tylnol and muscle relaxant for acute back pain and spasm

## 2017-06-20 NOTE — Assessment & Plan Note (Signed)
Controlled, no change in medication DASH diet and commitment to daily physical activity for a minimum of 30 minutes discussed and encouraged, as a part of hypertension management. The importance of attaining a healthy weight is also discussed.  BP/Weight 06/14/2017 06/11/2017 05/25/2017 05/12/2017 03/08/2017 02/09/2017 77/41/4239  Systolic BP 532 023 343 568 616 837 290  Diastolic BP 76 94 70 86 82 70 80  Wt. (Lbs) 211.25 209 209 209 - 208 214  BMI 30.31 29.99 29.99 29.99 - 29.84 30.71

## 2017-06-20 NOTE — Assessment & Plan Note (Signed)
Uncontrolled, reminded of his need to engae an f/u with endo Antonio Cobb is reminded of the importance of commitment to daily physical activity for 30 minutes or more, as able and the need to limit carbohydrate intake to 30 to 60 grams per meal to help with blood sugar control.   The need to take medication as prescribed, test blood sugar as directed, and to call between visits if there is a concern that blood sugar is uncontrolled is also discussed.   Antonio Cobb is reminded of the importance of daily foot exam, annual eye examination, and good blood sugar, blood pressure and cholesterol control.  Diabetic Labs Latest Ref Rng & Units 06/11/2017 02/09/2017 10/28/2016 10/09/2016 10/08/2016  HbA1c <5.7 % - 10.4(H) - - -  Microalbumin Not Estab. ug/mL - - <3.0(H) - -  Micro/Creat Ratio 0.0 - 30.0 mg/g creat - - <6.2 - -  Chol <200 mg/dL - 140 - - -  HDL >40 mg/dL - 26(L) - - -  Calc LDL <100 mg/dL - 87 - - -  Triglycerides <150 mg/dL - 133 - - -  Creatinine 0.61 - 1.24 mg/dL 1.10 1.01 - 1.00 1.18   BP/Weight 06/14/2017 06/11/2017 05/25/2017 05/12/2017 03/08/2017 02/09/2017 51/76/1607  Systolic BP 371 062 694 854 627 035 009  Diastolic BP 76 94 70 86 82 70 80  Wt. (Lbs) 211.25 209 209 209 - 208 214  BMI 30.31 29.99 29.99 29.99 - 29.84 30.71   Foot/eye exam completion dates Latest Ref Rng & Units 10/28/2016 10/10/2015  Eye Exam No Retinopathy - No Retinopathy  Foot Form Completion - Done -

## 2017-06-20 NOTE — Progress Notes (Signed)
Antonio Cobb     MRN: [id number]      DOB: Aug 03, 1961   HPI Antonio Cobb is here for follow up of recent ED visit when he presented with acute low back pain radiatng down left  buttock and posterior thigh, first episode ever, had fallen 3 weeks prioruma, so severe he could barely walk.   Still needs to schedule endo appt , has upcoming appts with cardiology and pulmonary, breathing has improved significantly and he has stopped smoking and cut back on alcohol use at this time The PT denies any adverse reactions to current medications since the last visit.  Denies polyuria, polydipsia, blurred vision , or hypoglycemic episodes.   ROS Denies recent fever or chills. Denies sinus pressure, nasal congestion, ear pain or sore throat. Denies chest congestion, productive cough or wheezing. Denies chest pains, palpitations and leg swelling Denies abdominal pain, nausea, vomiting,diarrhea or constipation.   Denies dysuria, frequency, hesitancy or incontinence. Denies headaches, seizures, numbness, or tingling. Denies depression, anxiety or insomnia. Denies skin break down or rash.   PE  BP 118/76 (BP Location: Left Arm, Patient Position: Sitting)   Pulse 90   Temp 99.7 F (37.6 C)   Resp 14   Ht 5\' 10"  (1.778 m)   Wt 211 lb 4 oz (95.8 kg)   SpO2 97%   BMI 30.31 kg/m   Patient alert and oriented and in no cardiopulmonary distress.  HEENT: No facial asymmetry, EOMI,   oropharynx pink and moist.  Neck supple no JVD, no mass.  Chest: Clear to auscultation bilaterally.  CVS: S1, S2 no murmurs, no S3.Regular rate.  ABD: Soft non tender.   Ext: No edema  MS: Adequate ROM spine, shoulders, hips and knees.  Skin: Intact, no ulcerations or rash noted.  Psych: Good eye contact, normal affect. Memory intact not anxious or depressed appearing.  CNS: CN 2-12 intact, power,  normal throughout.no focal deficits noted.   Assessment & Plan  Encounter for examination following  treatment at hospital Resolution of acute sciatica, pt counseled re need to avoid over use/ abuse of lumbar spine, may use tylnol and muscle relaxant for acute back pain and spasm  Essential hypertension Controlled, no change in medication DASH diet and commitment to daily physical activity for a minimum of 30 minutes discussed and encouraged, as a part of hypertension management. The importance of attaining a healthy weight is also discussed.  BP/Weight 06/14/2017 06/11/2017 05/25/2017 05/12/2017 03/08/2017 02/09/2017 66/05/3015  Systolic BP 010 932 355 732 202 542 706  Diastolic BP 76 94 70 86 82 70 80  Wt. (Lbs) 211.25 209 209 209 - 208 214  BMI 30.31 29.99 29.99 29.99 - 29.84 30.71       Diabetes mellitus, insulin dependent (IDDM), uncontrolled (HCC) Uncontrolled, reminded of his need to engae an f/u with endo Antonio Cobb is reminded of the importance of commitment to daily physical activity for 30 minutes or more, as able and the need to limit carbohydrate intake to 30 to 60 grams per meal to help with blood sugar control.   The need to take medication as prescribed, test blood sugar as directed, and to call between visits if there is a concern that blood sugar is uncontrolled is also discussed.   Antonio Cobb is reminded of the importance of daily foot exam, annual eye examination, and good blood sugar, blood pressure and cholesterol control.  Diabetic Labs Latest Ref Rng & Units 06/11/2017 02/09/2017 10/28/2016 10/09/2016 10/08/2016  HbA1c <  5.7 % - 10.4(H) - - -  Microalbumin Not Estab. ug/mL - - <3.0(H) - -  Micro/Creat Ratio 0.0 - 30.0 mg/g creat - - <6.2 - -  Chol <200 mg/dL - 140 - - -  HDL >40 mg/dL - 26(L) - - -  Calc LDL <100 mg/dL - 87 - - -  Triglycerides <150 mg/dL - 133 - - -  Creatinine 0.61 - 1.24 mg/dL 1.10 1.01 - 1.00 1.18   BP/Weight 06/14/2017 06/11/2017 05/25/2017 05/12/2017 03/08/2017 02/09/2017 53/20/2334  Systolic BP 356 861 683 729 021 115 520  Diastolic BP 76 94  70 86 82 70 80  Wt. (Lbs) 211.25 209 209 209 - 208 214  BMI 30.31 29.99 29.99 29.99 - 29.84 30.71   Foot/eye exam completion dates Latest Ref Rng & Units 10/28/2016 10/10/2015  Eye Exam No Retinopathy - No Retinopathy  Foot Form Completion - Done -

## 2017-06-29 ENCOUNTER — Ambulatory Visit (INDEPENDENT_AMBULATORY_CARE_PROVIDER_SITE_OTHER): Payer: BLUE CROSS/BLUE SHIELD | Admitting: Cardiology

## 2017-06-29 ENCOUNTER — Encounter: Payer: Self-pay | Admitting: Cardiology

## 2017-06-29 ENCOUNTER — Other Ambulatory Visit (HOSPITAL_COMMUNITY): Payer: Self-pay | Admitting: Respiratory Therapy

## 2017-06-29 ENCOUNTER — Telehealth: Payer: Self-pay | Admitting: Cardiology

## 2017-06-29 VITALS — BP 115/86 | HR 100 | Ht 70.0 in | Wt 207.0 lb

## 2017-06-29 DIAGNOSIS — E782 Mixed hyperlipidemia: Secondary | ICD-10-CM | POA: Diagnosis not present

## 2017-06-29 DIAGNOSIS — J441 Chronic obstructive pulmonary disease with (acute) exacerbation: Secondary | ICD-10-CM

## 2017-06-29 DIAGNOSIS — I10 Essential (primary) hypertension: Secondary | ICD-10-CM | POA: Diagnosis not present

## 2017-06-29 DIAGNOSIS — I5022 Chronic systolic (congestive) heart failure: Secondary | ICD-10-CM | POA: Diagnosis not present

## 2017-06-29 DIAGNOSIS — I2510 Atherosclerotic heart disease of native coronary artery without angina pectoris: Secondary | ICD-10-CM

## 2017-06-29 NOTE — Progress Notes (Signed)
Clinical Summary Antonio Cobb is a 56 y.o.male seen today for follow up of the following medical problems.   1. CAD - hx of inferolateral STEMI June 2013, received DES to LCX. LVEF at that time 55%.  - echo 04/2016 LVEF 30-35% - 05/2016 cath patent LCX stent, moderate RCA and LAD disease.   - no recent ches tpain  2. Chronic systolic HF - drop in LVEF noted by echo 04/2016, cath at that time did not show any new significant obstructive CAD - 09/2016 echo LVEF 30-35%, moderate MR.   - entresto caused caugh and dizzy spells, now just on losartan.  - DOE variable, worst in the morning. Can have some occasional LE edema - compliant with meds. Limiting sodium intake. Not checking weights regularly.    2. Hyperlipidemia - compliant with statin. Stopped statin for a period of time, now back on x for a few weeks.  01/2017 TC 140 TG 133 HDL 26 LDL 87  3. HTN - compliant with meds  4. COPD - followed by pcp and Dr Luan Pulling   5. OSA screen +snoring, no apneic episodes, can have some day time somnolence.  - reports sleep study 5 years ago at [facility], reports it was normal.     Past Medical History:  Diagnosis Date  . Alcohol abuse   . CAD (coronary artery disease)    DES to circumflex June 2013  . Cardiomyopathy (HCC)    LVEF 30-35%  . COPD (chronic obstructive pulmonary disease) ([place])   . Diabetes mellitus, type 2 ([place])   . Essential hypertension   . Mitral regurgitation    Moderate  . Mixed hyperlipidemia   . Myocardial infarction ([place]) 05/23/2012   Inferolateral STEMI  . Obesity      No Known Allergies   Current Outpatient Prescriptions  Medication Sig Dispense Refill  . albuterol (PROVENTIL HFA;VENTOLIN HFA) 108 (90 Base) MCG/ACT inhaler Inhale 2 puffs into the lungs every 6 (six) hours as needed for wheezing or shortness of breath. 18 g 0  . amLODipine (NORVASC) 10 MG tablet Take 1 tablet (10 mg total) by mouth daily. 90 tablet 1  . aspirin (ASPIRIN  ADULT LOW STRENGTH) 81 MG EC tablet Take 81 mg by mouth daily.      [name] atorvastatin (LIPITOR) 40 MG tablet Take 1 tablet (40 mg total) by mouth daily. 90 tablet 1  . budesonide-formoterol (SYMBICORT) 80-4.5 MCG/ACT inhaler Inhale 2 puffs into the lungs 2 (two) times daily. 1 Inhaler 3  . FREESTYLE LITE test strip TEST four times a day 100 each 6  . furosemide (LASIX) 40 MG tablet Take 1 tablet (40 mg total) by mouth daily. 90 tablet 1  . HYDROcodone-acetaminophen (NORCO/VICODIN) 5-325 MG tablet Take 1 tablet by mouth every 4 (four) hours as needed. 12 tablet 0  . insulin aspart (NOVOLOG) 100 UNIT/ML injection Inject 15 Units into the skin 3 (three) times daily before meals. Sliding scale begins at 15 units and increases as CBG dictates (Patient taking differently: Inject 10-30 Units into the skin 3 (three) times daily before meals. Sliding scale per home instructions) 10 mL 0  . Insulin Glargine (LANTUS SOLOSTAR) 100 UNIT/ML Solostar Pen Inject 60 Units into the skin daily at 10 pm. Everynight    . ipratropium-albuterol (DUONEB) 0.5-2.5 (3) MG/3ML SOLN Take 3 mLs by nebulization every 6 (six) hours as needed. (Patient taking differently: Take 3 mLs by nebulization every 6 (six) hours as needed (for wheezing or shortness  of breath). ) 360 mL 0  . Lancets (FREESTYLE) lancets Four times daily testing dx e11.65 150 each 1  . losartan (COZAAR) 25 MG tablet Take 0.5 tablets (12.5 mg total) by mouth daily. 15 tablet 6  . meloxicam (MOBIC) 15 MG tablet Take 1 tablet (15 mg total) by mouth daily. 6 tablet 0  . methocarbamol (ROBAXIN) 500 MG tablet Take 1 tablet (500 mg total) by mouth 3 (three) times daily. 21 tablet 0  . metoprolol succinate (TOPROL-XL) 50 MG 24 hr tablet take 1 tablet once daily with food 90 tablet 1  . Multiple Vitamin (MULTIVITAMIN) capsule Take 1 capsule by mouth daily.    . nitroGLYCERIN (NITROSTAT) 0.4 MG SL tablet Place 1 tablet (0.4 mg total) under the tongue every 5 (five) minutes as  needed for chest pain. (Patient not taking: Reported on 06/14/2017) 25 tablet 3  . potassium chloride SA (KLOR-CON M20) 20 MEQ tablet Take 1 tablet (20 mEq total) by mouth daily. 90 tablet 1  . spironolactone (ALDACTONE) 25 MG tablet Take 1 tablet (25 mg total) by mouth daily. 90 tablet 1  . tiotropium (SPIRIVA HANDIHALER) 18 MCG inhalation capsule inhale the contents of one capsule in the handihaler once daily 90 capsule 1   No current facility-administered medications for this visit.      Past Surgical History:  Procedure Laterality Date  . CARDIAC CATHETERIZATION  2 yrs ago  . CARDIAC CATHETERIZATION N/A 05/15/2016   Procedure: Right/Left Heart Cath and Coronary Angiography;  Surgeon: [name], MD;  Location: [place] [facility];  Service: Cardiovascular;  Laterality: N/A;  . CORONARY STENT PLACEMENT  05/23/12  . LEFT HEART CATHETERIZATION WITH CORONARY ANGIOGRAM N/A 05/23/2012   Procedure: LEFT HEART CATHETERIZATION WITH CORONARY ANGIOGRAM;  Surgeon: [name], MD;  Location[facility] CATH LAB;  Service: Cardiovascular;  Laterality: N/A;  . None    . PERCUTANEOUS CORONARY STENT INTERVENTION (PCI-S) N/A 05/23/2012   Procedure: PERCUTANEOUS CORONARY STENT INTERVENTION (PCI-S);  Surgeon: [name], MD;  Location: [facility] CATH LAB;  Service: Cardiovascular;  Laterality: N/A;  . POLYPECTOMY  10/16/2011   Procedure: POLYPECTOMY;  Surgeon: [name], MD;  Location: [facility];  Service: Endoscopy;;  Polypoid Lesion, Transverse and Sigmoid Colon     No Known Allergies    Family History  Problem Relation Age of Onset  . Emphysema Mother   . Heart attack Mother   . Hypertension Mother   . Diabetes Mother   . Heart disease Mother   . COPD Mother   . Emphysema Father   . COPD Father   . Stroke Sister   . Asthma Sister   . Colon cancer Neg Hx   . Anesthesia problems Neg Hx   . Hypotension Neg Hx   . Malignant hyperthermia Neg Hx   . Pseudochol deficiency Neg Hx      Social  History Antonio Cobb reports that he quit smoking about 13 months ago. His smoking use included Cigars. He has a 7.50 pack-year smoking history. He has never used smokeless tobacco. Antonio Cobb reports that he drinks alcohol.   Review of Systems CONSTITUTIONAL: No weight loss, fever, chills, weakness or fatigue.  HEENT: Eyes: No visual loss, blurred vision, double vision or yellow sclerae.No hearing loss, sneezing, congestion, runny nose or sore throat.  SKIN: No rash or itching.  CARDIOVASCULAR: per hpi RESPIRATORY: No shortness of breath, cough or sputum.  GASTROINTESTINAL: No anorexia, nausea, vomiting or diarrhea. No abdominal pain or blood.  GENITOURINARY: No burning on urination, no polyuria NEUROLOGICAL: No headache, dizziness, syncope, paralysis, ataxia, numbness or tingling in the extremities. No change in bowel or bladder control.  MUSCULOSKELETAL: No muscle, back pain, joint pain or stiffness.  LYMPHATICS: No enlarged nodes. No history of splenectomy.  PSYCHIATRIC: No history of depression or anxiety.  ENDOCRINOLOGIC: No reports of sweating, cold or heat intolerance. No polyuria or polydipsia.  [name]   Physical Examination Vitals:   06/29/17 1449  BP: 115/86  Pulse: 100   Vitals:   06/29/17 1449  Weight: 207 lb (93.9 kg)  Height: 5\' 10"  (1.778 m)    Gen: resting comfortably, no acute distress HEENT: no scleral icterus, pupils equal round and reactive, no palptable cervical adenopathy,  CV: RRR, no m/r/g, no jvd Resp: Clear to auscultation bilaterally GI: abdomen is soft, non-tender, non-distended, normal bowel sounds, no hepatosplenomegaly MSK: extremities are warm, no edema.  Skin: warm, no rash Neuro:  no focal deficits Psych: appropriate affect   Diagnostic Studies Cath 05/2012 [facility] FINDINGS  Hemodynamics:  AO 119/78  LV 121/71  Coronary angiography:  Coronary dominance: right  Left mainstem: The left main is patent with diffuse nonobstructive  plaque. The distal left main has 30% stenosis.  Left anterior descending (LAD): the LAD is patent to the LV apex. The mid LAD has 50-60% stenosis at the origin of the second diagonal branch. The first diagonal is very large in caliber and has no significant obstructive disease.  Left circumflex (LCx): the left circumflex is totally occluded in the mid vessel. There is TIMI 0 flow. Following PCI, there were 2 obtuse marginals and a left posterolateral branch visualized, all of which are patent.  Right coronary artery (RCA): there is a high anterior origin of the RCA. The mid vessel has diffuse plaque estimated at 50-60%. The vessel is dominant. There is diffuse nonobstructive disease throughout. There is a small PDA and small posterolateral branch.  Left ventriculography: Left ventricular systolic function is in the low-normal range, LVEF is estimated at 55%, there is hypokinesis of the basal and midinferior wall.  PCI Note: Following the diagnostic procedure, the decision was made to proceed with PCI. The patient was loaded with Effient 60 mg. Weight-based bivalirudin was given for anticoagulation. Once a therapeutic ACT was achieved, a 6 [place] XB-LAD guide catheter was inserted. A Cougar coronary guidewire was used to cross the lesion. The lesion was predilated with a 2.5x15 mm balloon. The lesion was then stented with a 3.5x15 mm Resolute drug-eluting stent. Following PCI, there was 0% residual stenosis and TIMI-3 flow. Final angiography confirmed an excellent result. The patient tolerated the procedure well. There were no immediate procedural complications. A TR band was used for radial hemostasis. The patient was transferred to the post catheterization recovery area for further monitoring.  PCI Data:  Vessel - circumflex /Segment - mid  Percent Stenosis (pre) 100  TIMI-flow 0  Stent 3.5 x 15 mm resolute integrity DES  Percent Stenosis (post) 0  TIMI-flow (post) 3  Final Conclusions:   1. Total occlusion of the left circumflex with successful primary PCI using a drug-eluting stent platform  2. Diffuse nonobstructive disease of the right coronary artery and LAD  3. Mild left ventricular dysfunction consistent with inferior MI  Recommendations:  transfer to ICU. Post MI medical therapy will be instituted. Tobacco cessation counseling will be done. Anticipate 48 hour hospitalization if his post MI course is uncomplicated.  09/2016 echo Study Conclusions  - Left ventricle: The cavity  size was mildly dilated. Wall   thickness was increased in a pattern of mild LVH. Systolic   function was moderately to severely reduced. The estimated   ejection fraction was in the range of 30% to 35%. There is   akinesis of the inferolateral and inferior myocardium. Doppler   parameters are consistent with restrictive physiology, indicative   of decreased left ventricular diastolic compliance and/or   increased left atrial pressure. - Aortic valve: Mildly calcified annulus. Trileaflet. There was   trivial regurgitation. - Mitral valve: Mildly thickened leaflets . There was moderate   regurgitation directed posteriorly. - Left atrium: The atrium was mildly dilated. - Right atrium: Central venous pressure (est): 3 mm Hg. - Tricuspid valve: There was mild regurgitation. - Pulmonary arteries: PA peak pressure: 36 mm Hg (S). - Pericardium, extracardiac: There was no pericardial effusion.  Impressions:  - Mild LVH with mild LV chamber dilatation and LVEF 30-35%.   Inferior/inferolateral akinesis. Restrictive diastolic filling   pattern noted. Mild left atrial enlargement. Mildly thickened   mitral leaflets with moderate, posteriorly directed mitral   regurgitation. Mildly calcified aortic annulus with trivial   aortic regurgitation. Mild tricuspid regurgitation with PASP 36   mmHg.  05/2016 cath  Patent stent in the mid circumflex.  Moderate disease in the RCA and  LAD.  Mild pulmonary hypertension.  Cardiac output 5.1 L/m. Cardiac index 2.4  Pulmonary artery saturation 66%.   Cardiomyopathy not related to obstructive coronary artery disease. The patient did state that he drinks alcohol heavily. I did explain him the cutting back on his alcohol may help his cardiac function. Follow-up with Dr. Harl Bowie.   Assessment and Plan  1. CAD - no current symptoms - continue current therapy  2. Chronic systolic HF - repeat ehco  3. . Hyperlipidemia - he will continue current staitn  4. HTN - his bp is at goal, continue current meds    F/u 1 month       Arnoldo Lenis, M.D., F.A.C.C.

## 2017-06-29 NOTE — Telephone Encounter (Signed)
Pre-cert Verification for the following procedure   Echo scheduled for 07-22-17

## 2017-06-29 NOTE — Patient Instructions (Signed)
Your physician recommends that you schedule a follow-up appointment in: 1 MONTH WITH DR BRANCH  Your physician recommends that you continue on your current medications as directed. Please refer to the Current Medication list given to you today.  Your physician has requested that you have an echocardiogram. Echocardiography is a painless test that uses sound waves to create images of your heart. It provides your doctor with information about the size and shape of your heart and how well your heart's chambers and valves are working. This procedure takes approximately one hour. There are no restrictions for this procedure.  Thank you for choosing  [facility]!!    

## 2017-07-21 ENCOUNTER — Ambulatory Visit (HOSPITAL_COMMUNITY)
Admission: RE | Admit: 2017-07-21 | Discharge: 2017-07-21 | Disposition: A | Discharge status: Home / Self Care | Payer: BLUE CROSS/BLUE SHIELD | Source: Ambulatory Visit | Attending: Pulmonary Disease | Admitting: Pulmonary Disease

## 2017-07-21 DIAGNOSIS — J441 Chronic obstructive pulmonary disease with (acute) exacerbation: Secondary | ICD-10-CM | POA: Insufficient documentation

## 2017-07-21 LAB — PULMONARY FUNCTION TEST
DL/VA % PRED: 97 %
DL/VA: 4.49 ml/min/mmHg/L
DLCO COR % PRED: 71 %
DLCO COR: 23.03 ml/min/mmHg
DLCO UNC % PRED: 71 %
DLCO UNC: 23.03 ml/min/mmHg
FEF 25-75 POST: 3.05 L/s
FEF 25-75 PRE: 2.27 L/s
FEF2575-%CHANGE-POST: 34 %
FEF2575-%PRED-POST: 100 %
FEF2575-%PRED-PRE: 74 %
FEV1-%CHANGE-POST: 9 %
FEV1-%PRED-POST: 72 %
FEV1-%Pred-Pre: 66 %
FEV1-PRE: 2.11 L
FEV1-Post: 2.31 L
FEV1FVC-%Change-Post: 2 %
FEV1FVC-%PRED-PRE: 101 %
FEV6-%Change-Post: 6 %
FEV6-%PRED-POST: 71 %
FEV6-%Pred-Pre: 66 %
FEV6-POST: 2.81 L
FEV6-Pre: 2.63 L
FEV6FVC-%PRED-POST: 103 %
FEV6FVC-%Pred-Pre: 103 %
FVC-%Change-Post: 6 %
FVC-%Pred-Post: 69 %
FVC-%Pred-Pre: 64 %
FVC-Post: 2.81 L
FVC-Pre: 2.63 L
PRE FEV1/FVC RATIO: 80 %
Post FEV1/FVC ratio: 82 %
Post FEV6/FVC ratio: 100 %
Pre FEV6/FVC Ratio: 100 %
RV % pred: 87 %
RV: 1.92 L
TLC % pred: 65 %
TLC: 4.56 L

## 2017-07-21 MED ORDER — ALBUTEROL SULFATE (2.5 MG/3ML) 0.083% IN NEBU: 2.5000 mg | INHALATION_SOLUTION | Freq: Once | RESPIRATORY_TRACT | Status: DC

## 2017-07-22 ENCOUNTER — Other Ambulatory Visit: Payer: BLUE CROSS/BLUE SHIELD

## 2017-07-28 ENCOUNTER — Other Ambulatory Visit: Payer: Self-pay | Admitting: Cardiology

## 2017-07-28 DIAGNOSIS — I5020 Unspecified systolic (congestive) heart failure: Secondary | ICD-10-CM

## 2017-07-29 ENCOUNTER — Observation Stay (HOSPITAL_COMMUNITY)
Admission: EM | Admit: 2017-07-29 | Discharge: 2017-07-30 | Disposition: A | Discharge status: Home / Self Care | Payer: BLUE CROSS/BLUE SHIELD | Attending: Internal Medicine | Admitting: Internal Medicine

## 2017-07-29 ENCOUNTER — Emergency Department (HOSPITAL_COMMUNITY): Payer: BLUE CROSS/BLUE SHIELD

## 2017-07-29 ENCOUNTER — Encounter (HOSPITAL_COMMUNITY): Payer: Self-pay | Admitting: Emergency Medicine

## 2017-07-29 DIAGNOSIS — Z7951 Long term (current) use of inhaled steroids: Secondary | ICD-10-CM | POA: Insufficient documentation

## 2017-07-29 DIAGNOSIS — I252 Old myocardial infarction: Secondary | ICD-10-CM | POA: Diagnosis not present

## 2017-07-29 DIAGNOSIS — Z794 Long term (current) use of insulin: Secondary | ICD-10-CM | POA: Insufficient documentation

## 2017-07-29 DIAGNOSIS — I509 Heart failure, unspecified: Secondary | ICD-10-CM

## 2017-07-29 DIAGNOSIS — Z7982 Long term (current) use of aspirin: Secondary | ICD-10-CM | POA: Insufficient documentation

## 2017-07-29 DIAGNOSIS — Z79899 Other long term (current) drug therapy: Secondary | ICD-10-CM | POA: Insufficient documentation

## 2017-07-29 DIAGNOSIS — J811 Chronic pulmonary edema: Secondary | ICD-10-CM | POA: Diagnosis present

## 2017-07-29 DIAGNOSIS — Z791 Long term (current) use of non-steroidal anti-inflammatories (NSAID): Secondary | ICD-10-CM | POA: Insufficient documentation

## 2017-07-29 DIAGNOSIS — I2510 Atherosclerotic heart disease of native coronary artery without angina pectoris: Secondary | ICD-10-CM | POA: Diagnosis not present

## 2017-07-29 DIAGNOSIS — E1065 Type 1 diabetes mellitus with hyperglycemia: Secondary | ICD-10-CM

## 2017-07-29 DIAGNOSIS — E119 Type 2 diabetes mellitus without complications: Secondary | ICD-10-CM | POA: Insufficient documentation

## 2017-07-29 DIAGNOSIS — IMO0002 Reserved for concepts with insufficient information to code with codable children: Secondary | ICD-10-CM

## 2017-07-29 DIAGNOSIS — I340 Nonrheumatic mitral (valve) insufficiency: Secondary | ICD-10-CM | POA: Diagnosis not present

## 2017-07-29 DIAGNOSIS — I110 Hypertensive heart disease with heart failure: Secondary | ICD-10-CM | POA: Diagnosis not present

## 2017-07-29 DIAGNOSIS — Z951 Presence of aortocoronary bypass graft: Secondary | ICD-10-CM | POA: Diagnosis not present

## 2017-07-29 DIAGNOSIS — Z87891 Personal history of nicotine dependence: Secondary | ICD-10-CM | POA: Diagnosis not present

## 2017-07-29 DIAGNOSIS — I5023 Acute on chronic systolic (congestive) heart failure: Secondary | ICD-10-CM | POA: Insufficient documentation

## 2017-07-29 DIAGNOSIS — E1029 Type 1 diabetes mellitus with other diabetic kidney complication: Secondary | ICD-10-CM | POA: Diagnosis not present

## 2017-07-29 DIAGNOSIS — J449 Chronic obstructive pulmonary disease, unspecified: Secondary | ICD-10-CM | POA: Insufficient documentation

## 2017-07-29 DIAGNOSIS — R0602 Shortness of breath: Secondary | ICD-10-CM | POA: Diagnosis present

## 2017-07-29 DIAGNOSIS — J810 Acute pulmonary edema: Secondary | ICD-10-CM

## 2017-07-29 DIAGNOSIS — Z955 Presence of coronary angioplasty implant and graft: Secondary | ICD-10-CM | POA: Diagnosis not present

## 2017-07-29 LAB — GLUCOSE, CAPILLARY: Glucose-Capillary: 444 mg/dL — ABNORMAL HIGH (ref 65–99)

## 2017-07-29 LAB — TROPONIN I: TROPONIN I: 0.04 ng/mL — AB (ref <0.03)

## 2017-07-29 LAB — CBC WITH DIFFERENTIAL/PLATELET
Basophils Absolute: 0 10*3/uL (ref 0.0–0.1)
Basophils Relative: 0 %
Eosinophils Absolute: 0.1 10*3/uL (ref 0.0–0.7)
Eosinophils Relative: 1 %
HEMATOCRIT: 43.4 % (ref 39.0–52.0)
HEMOGLOBIN: 14.4 g/dL (ref 13.0–17.0)
LYMPHS PCT: 8 %
Lymphs Abs: 0.9 10*3/uL (ref 0.7–4.0)
MCH: 28.6 pg (ref 26.0–34.0)
MCHC: 33.2 g/dL (ref 30.0–36.0)
MCV: 86.3 fL (ref 78.0–100.0)
Monocytes Absolute: 0.4 10*3/uL (ref 0.1–1.0)
Monocytes Relative: 3 %
NEUTROS ABS: 10.5 10*3/uL — AB (ref 1.7–7.7)
NEUTROS PCT: 88 %
Platelets: 221 10*3/uL (ref 150–400)
RBC: 5.03 MIL/uL (ref 4.22–5.81)
RDW: 13.3 % (ref 11.5–15.5)
WBC: 11.9 10*3/uL — ABNORMAL HIGH (ref 4.0–10.5)

## 2017-07-29 LAB — COMPREHENSIVE METABOLIC PANEL
ALT: 42 U/L (ref 17–63)
AST: 41 U/L (ref 15–41)
Albumin: 3.7 g/dL (ref 3.5–5.0)
Alkaline Phosphatase: 85 U/L (ref 38–126)
Anion gap: 10 (ref 5–15)
BUN: 20 mg/dL (ref 6–20)
CHLORIDE: 97 mmol/L — AB (ref 101–111)
CO2: 29 mmol/L (ref 22–32)
CREATININE: 1.33 mg/dL — AB (ref 0.61–1.24)
Calcium: 8.9 mg/dL (ref 8.9–10.3)
GFR calc Af Amer: >60 mL/min (ref >60)
GFR, EST NON AFRICAN AMERICAN: 58 mL/min — AB (ref >60)
Glucose, Bld: 413 mg/dL — ABNORMAL HIGH (ref 65–99)
POTASSIUM: 3.6 mmol/L (ref 3.5–5.1)
SODIUM: 136 mmol/L (ref 135–145)
Total Bilirubin: 0.8 mg/dL (ref 0.3–1.2)
Total Protein: 7.7 g/dL (ref 6.5–8.1)

## 2017-07-29 LAB — BRAIN NATRIURETIC PEPTIDE: B NATRIURETIC PEPTIDE 5: 315 pg/mL — AB (ref 0.0–100.0)

## 2017-07-29 LAB — CBG MONITORING, ED
Glucose-Capillary: 416 mg/dL — ABNORMAL HIGH (ref 65–99)
Glucose-Capillary: 535 mg/dL (ref 65–99)

## 2017-07-29 MED ORDER — FUROSEMIDE 10 MG/ML IJ SOLN
40.0000 mg | Freq: Once | INTRAMUSCULAR | Status: AC
  Administered 2017-07-29: 40 mg via INTRAVENOUS
  Filled 2017-07-29: qty 4

## 2017-07-29 MED ORDER — ENOXAPARIN SODIUM 40 MG/0.4ML ~~LOC~~ SOLN
40.0000 mg | SUBCUTANEOUS | Status: DC
  Administered 2017-07-29: 40 mg via SUBCUTANEOUS
  Filled 2017-07-29: qty 0.4

## 2017-07-29 MED ORDER — NITROGLYCERIN IN D5W 200-5 MCG/ML-% IV SOLN
5.0000 ug/min | Freq: Once | INTRAVENOUS | Status: AC
  Administered 2017-07-29: 5 ug/min via INTRAVENOUS
  Filled 2017-07-29: qty 250

## 2017-07-29 MED ORDER — NITROGLYCERIN 0.4 MG SL SUBL
0.4000 mg | SUBLINGUAL_TABLET | Freq: Once | SUBLINGUAL | Status: AC
  Administered 2017-07-29: 0.4 mg via SUBLINGUAL
  Filled 2017-07-29: qty 1

## 2017-07-29 MED ORDER — INSULIN ASPART 100 UNIT/ML ~~LOC~~ SOLN
15.0000 [IU] | Freq: Three times a day (TID) | SUBCUTANEOUS | Status: DC
  Administered 2017-07-29 – 2017-07-30 (×3): 15 [IU] via SUBCUTANEOUS
  Filled 2017-07-29: qty 1

## 2017-07-29 MED ORDER — AMLODIPINE BESYLATE 5 MG PO TABS
10.0000 mg | ORAL_TABLET | Freq: Every day | ORAL | Status: DC
  Administered 2017-07-30: 10 mg via ORAL
  Filled 2017-07-29: qty 2

## 2017-07-29 MED ORDER — ADULT MULTIVITAMIN W/MINERALS CH
1.0000 | ORAL_TABLET | Freq: Every day | ORAL | Status: DC
  Administered 2017-07-30: 1 via ORAL
  Filled 2017-07-29: qty 1

## 2017-07-29 MED ORDER — FUROSEMIDE 40 MG PO TABS
40.0000 mg | ORAL_TABLET | Freq: Once | ORAL | Status: AC
  Administered 2017-07-29: 40 mg via ORAL
  Filled 2017-07-29: qty 1

## 2017-07-29 MED ORDER — HYDROCODONE-ACETAMINOPHEN 5-325 MG PO TABS: 1.0000 | ORAL_TABLET | ORAL | Status: DC | PRN

## 2017-07-29 MED ORDER — FUROSEMIDE 10 MG/ML IJ SOLN
40.0000 mg | Freq: Two times a day (BID) | INTRAMUSCULAR | Status: DC
  Administered 2017-07-30: 40 mg via INTRAVENOUS
  Filled 2017-07-29: qty 4

## 2017-07-29 MED ORDER — FUROSEMIDE 10 MG/ML IJ SOLN: 40.0000 mg | Freq: Three times a day (TID) | INTRAMUSCULAR | Status: DC

## 2017-07-29 MED ORDER — INSULIN GLARGINE 100 UNIT/ML ~~LOC~~ SOLN
60.0000 [IU] | Freq: Every day | SUBCUTANEOUS | Status: DC
  Administered 2017-07-29: 60 [IU] via SUBCUTANEOUS
  Filled 2017-07-29 (×5): qty 0.6

## 2017-07-29 MED ORDER — TIOTROPIUM BROMIDE MONOHYDRATE 18 MCG IN CAPS
18.0000 ug | ORAL_CAPSULE | Freq: Every day | RESPIRATORY_TRACT | Status: DC
  Administered 2017-07-30: 18 ug via RESPIRATORY_TRACT
  Filled 2017-07-29: qty 5

## 2017-07-29 MED ORDER — METOPROLOL SUCCINATE ER 50 MG PO TB24
50.0000 mg | ORAL_TABLET | Freq: Every day | ORAL | Status: DC
  Administered 2017-07-30: 50 mg via ORAL
  Filled 2017-07-29: qty 1

## 2017-07-29 MED ORDER — ALBUTEROL SULFATE HFA 108 (90 BASE) MCG/ACT IN AERS: 2.0000 | INHALATION_SPRAY | Freq: Four times a day (QID) | RESPIRATORY_TRACT | Status: DC | PRN

## 2017-07-29 MED ORDER — FUROSEMIDE 10 MG/ML IJ SOLN: 40.0000 mg | Freq: Once | INTRAMUSCULAR | Status: DC

## 2017-07-29 MED ORDER — POTASSIUM CHLORIDE CRYS ER 20 MEQ PO TBCR
20.0000 meq | EXTENDED_RELEASE_TABLET | Freq: Every day | ORAL | Status: DC
  Administered 2017-07-30: 20 meq via ORAL
  Filled 2017-07-29: qty 1

## 2017-07-29 MED ORDER — ATORVASTATIN CALCIUM 40 MG PO TABS
40.0000 mg | ORAL_TABLET | Freq: Every day | ORAL | Status: DC
  Administered 2017-07-30: 40 mg via ORAL
  Filled 2017-07-29: qty 1

## 2017-07-29 MED ORDER — NITROGLYCERIN 0.4 MG SL SUBL: 0.4000 mg | SUBLINGUAL_TABLET | SUBLINGUAL | Status: DC | PRN

## 2017-07-29 MED ORDER — MOMETASONE FURO-FORMOTEROL FUM 100-5 MCG/ACT IN AERO
2.0000 | INHALATION_SPRAY | Freq: Two times a day (BID) | RESPIRATORY_TRACT | Status: DC
  Administered 2017-07-29 – 2017-07-30 (×2): 2 via RESPIRATORY_TRACT
  Filled 2017-07-29: qty 8.8

## 2017-07-29 MED ORDER — ALBUTEROL SULFATE (2.5 MG/3ML) 0.083% IN NEBU: 2.5000 mg | INHALATION_SOLUTION | Freq: Four times a day (QID) | RESPIRATORY_TRACT | Status: DC | PRN

## 2017-07-29 MED ORDER — ASPIRIN EC 81 MG PO TBEC
81.0000 mg | DELAYED_RELEASE_TABLET | Freq: Every day | ORAL | Status: DC
  Administered 2017-07-30: 81 mg via ORAL
  Filled 2017-07-29 (×4): qty 1

## 2017-07-29 NOTE — ED Notes (Signed)
Meal tray given 

## 2017-07-29 NOTE — H&P (Signed)
History and Physical    Antonio Cobb UDJ:[id number] DOB: Aug 30, 1961 DOA: 07/29/2017  Referring MD/NP/PA: Dr.Pickering . PCP: [name], MD (  Patient coming from: Home   Chief Complaint:   Severe shortness of breath a few days in duration , orthopnea and abdominal distention.   HPI: Antonio Cobb is a 56 y.o. 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% , hypertension, insulin-dependent diabetes, COPD, who is not compliant with his diet and salt intake presents to the ER today after he has been complaining of worsening shortness of breath orthopnea abdominal distention for the last few days Worse in the morning today to the point he was not able to catch his breath he called the EMS who brought him to the ER in route he was found to be wheezy nebs back-to-back was not helpful to relieve his shortness of breath in our ER he was found to be in severe respiratory distress put on nonrebreather along with a chest x-ray showed pulmonary edema received Lasix with significant improvement in the shortness of breath .The patient and his wife did mention to me that he was having progressive shortness of breath for the last few days he was eating frozen food with increased salt intake  Review of Systems: As per HPI otherwise 10 point review of systems negative.  Positive for shortness of breath orthopnea PND with abdominal distention .     Past Medical History:  Diagnosis Date  . Alcohol abuse   . CAD (coronary artery disease)    DES to circumflex June 2013  . Cardiomyopathy (HCC)    LVEF 30-35%  . COPD (chronic obstructive pulmonary disease) ([place])   . Diabetes mellitus, type 2 ([place])   . Essential hypertension   . Mitral regurgitation    Moderate  . Mixed hyperlipidemia   . Myocardial infarction ([place]) 05/23/2012   Inferolateral STEMI  . Obesity     Past Surgical History:  Procedure Laterality Date    . CARDIAC CATHETERIZATION  2 yrs ago  . CARDIAC CATHETERIZATION N/A 05/15/2016   Procedure: Right/Left Heart Cath and Coronary Angiography;  Surgeon: [name], MD;  Location: [place] CV LAB;  Service: Cardiovascular;  Laterality: N/A;  . CORONARY STENT PLACEMENT  05/23/12  . LEFT HEART CATHETERIZATION WITH CORONARY ANGIOGRAM N/A 05/23/2012   Procedure: LEFT HEART CATHETERIZATION WITH CORONARY ANGIOGRAM;  Surgeon: [name], MD;  Location: [facility] CATH LAB;  Service: Cardiovascular;  Laterality: N/A;  . None    . PERCUTANEOUS CORONARY STENT INTERVENTION (PCI-S) N/A 05/23/2012   Procedure: PERCUTANEOUS CORONARY STENT INTERVENTION (PCI-S);  Surgeon: [name], MD;  Location: [facility] CATH LAB;  Service: Cardiovascular;  Laterality: N/A;  . POLYPECTOMY  10/16/2011   Procedure: POLYPECTOMY;  Surgeon: [name], MD;  Location: [facility];  Service: Endoscopy;;  Polypoid Lesion, Transverse and Sigmoid Colon     reports that he quit smoking about 14 months ago. His smoking use included Cigars. He has a 7.50 pack-year smoking history. He has never used smokeless tobacco. He reports that he drinks alcohol. He reports that he does not use drugs.  No Known Allergies  Family History  Problem Relation Age of Onset  . Emphysema Mother   . Heart attack Mother   . Hypertension Mother   . Diabetes Mother   . Heart disease Mother   . COPD Mother   . Emphysema Father   . COPD Father   . Stroke  Sister   . Asthma Sister   . Colon cancer Neg Hx   . Anesthesia problems Neg Hx   . Hypotension Neg Hx   . Malignant hyperthermia Neg Hx   . Pseudochol deficiency Neg Hx      Prior to Admission medications   Medication Sig Start Date End Date Taking? Authorizing Provider  albuterol (PROVENTIL HFA;VENTOLIN HFA) 108 (90 Base) MCG/ACT inhaler Inhale 2 puffs into the lungs every 6 (six) hours as needed for wheezing or shortness of breath. 02/26/17   [name], MD  amLODipine (NORVASC) 10 MG  tablet Take 1 tablet (10 mg total) by mouth daily. 05/12/17   [name], MD  aspirin (ASPIRIN ADULT LOW STRENGTH) 81 MG EC tablet Take 81 mg by mouth daily.      [provider]  atorvastatin (LIPITOR) 40 MG tablet Take 1 tablet (40 mg total) by mouth daily. 05/12/17   [name], MD  budesonide-formoterol (SYMBICORT) 80-4.5 MCG/ACT inhaler Inhale 2 puffs into the lungs 2 (two) times daily. 02/26/17   [name], MD  FREESTYLE LITE test strip TEST four times a day 03/15/17   [name], MD  furosemide (LASIX) 40 MG tablet Take 1 tablet (40 mg total) by mouth daily. 05/12/17   [name], MD  HYDROcodone-acetaminophen (NORCO/VICODIN) 5-325 MG tablet Take 1 tablet by mouth every 4 (four) hours as needed. 06/11/17   Lily Kocher, PA-C  insulin aspart (NOVOLOG) 100 UNIT/ML injection Inject 15 Units into the skin 3 (three) times daily before meals. Sliding scale begins at 15 units and increases as CBG dictates 03/23/15   Pollina, Gwenyth Allegra, MD  Insulin Glargine (LANTUS SOLOSTAR) 100 UNIT/ML Solostar Pen Inject 60 Units into the skin daily at 10 pm. Everynight    [provider]  ipratropium-albuterol (DUONEB) 0.5-2.5 (3) MG/3ML SOLN Take 3 mLs by nebulization every 6 (six) hours as needed. Patient taking differently: Take 3 mLs by nebulization every 6 (six) hours as needed (for wheezing or shortness of breath).  04/27/16   [name], MD  Lancets (FREESTYLE) lancets Four times daily testing dx e11.65 03/12/16   [name], MD  losartan (COZAAR) 25 MG tablet Take 12.5 mg by mouth daily.    [provider]  meloxicam (MOBIC) 15 MG tablet Take 1 tablet (15 mg total) by mouth daily. 06/11/17   Lily Kocher, PA-C  methocarbamol (ROBAXIN) 500 MG tablet Take 1 tablet (500 mg total) by mouth 3 (three) times daily. 06/11/17   Lily Kocher, PA-C  metoprolol succinate (TOPROL-XL) 50 MG 24 hr tablet take 1 tablet once daily with  food 05/12/17   [name], MD  Multiple Vitamin (MULTIVITAMIN) capsule Take 1 capsule by mouth daily.    [provider]  nitroGLYCERIN (NITROSTAT) 0.4 MG SL tablet Place 1 tablet (0.4 mg total) under the tongue every 5 (five) minutes as needed for chest pain. 05/29/16 11/06/19  Lendon Colonel, NP  potassium chloride SA (KLOR-CON M20) 20 MEQ tablet Take 1 tablet (20 mEq total) by mouth daily. 05/12/17   [name], MD  spironolactone (ALDACTONE) 25 MG tablet Take 1 tablet (25 mg total) by mouth daily. 05/12/17   [name], MD  tiotropium (SPIRIVA HANDIHALER) 18 MCG inhalation capsule inhale the contents of one capsule in the handihaler once daily 05/12/17   [name], MD    Physical Exam: Vitals:   07/29/17 0730 07/29/17 0800 07/29/17 0830 07/29/17 0900  BP: (!) 139/119 (!) 113/101 128/78 122/82  Pulse: 99 97 94 94  Resp: (!) 34 (!) 26 (!) 26 (!) 31  Temp:      TempSrc:      SpO2: 100% 97% 100% 99%  Weight:      Height:          Constitutional: NAD, calm, comfortable Vitals:   07/29/17 0730 07/29/17 0800 07/29/17 0830 07/29/17 0900  BP: (!) 139/119 (!) 113/101 128/78 122/82  Pulse: 99 97 94 94  Resp: (!) 34 (!) 26 (!) 26 (!) 31  Temp:      TempSrc:      SpO2: 100% 97% 100% 99%  Weight:      Height:       Eyes: PERRL, lids and conjunctivae normal ENMT: Mucous membranes are moist. Posterior pharynx clear of any exudate or lesions.Normal dentition.  Neck: normal, supple, no masses, no thyromegaly Respiratory: B/L Crackles and wheezing . Cardiovascular: Regular rate and rhythm, no murmurs / rubs / gallops. No extremity edema. 2+ pedal pulses. No carotid bruits.  Abdomen: Distended with dullness to percussion no rebound or rigidity no tenderness . Musculoskeletal: no clubbing / cyanosis. No joint deformity upper and lower extremities. Good ROM, no contractures. Normal muscle tone.  Skin: no rashes, lesions, ulcers. No  induration Neurologic: CN 2-12 grossly intact. Sensation intact, DTR normal. Strength 5/5 in all 4.  Psychiatric: Normal judgment and insight. Alert and oriented x 3. Normal mood.     Labs on Admission: I have personally reviewed following labs and imaging studies  CBC:  Recent Labs Lab 07/29/17 0837  WBC 11.9*  NEUTROABS 10.5*  HGB 14.4  HCT 43.4  MCV 86.3  PLT 462   Basic Metabolic Panel:  Recent Labs Lab 07/29/17 0837  NA 136  K 3.6  CL 97*  CO2 29  GLUCOSE 413*  BUN 20  CREATININE 1.33*  CALCIUM 8.9   GFR: Estimated Creatinine Clearance: 70.8 mL/min (A) (by C-G formula based on SCr of 1.33 mg/dL (H)). Liver Function Tests:  Recent Labs Lab 07/29/17 0837  AST 41  ALT 42  ALKPHOS 85  BILITOT 0.8  PROT 7.7  ALBUMIN 3.7   No results for input(s): LIPASE, AMYLASE in the last 168 hours. No results for input(s): AMMONIA in the last 168 hours. Coagulation Profile: No results for input(s): INR, PROTIME in the last 168 hours. Cardiac Enzymes:  Recent Labs Lab 07/29/17 0837  TROPONINI 0.04*   BNP (last 3 results) No results for input(s): PROBNP in the last 8760 hours. HbA1C: No results for input(s): HGBA1C in the last 72 hours. CBG: No results for input(s): GLUCAP in the last 168 hours. Lipid Profile: No results for input(s): CHOL, HDL, LDLCALC, TRIG, CHOLHDL, LDLDIRECT in the last 72 hours. Thyroid Function Tests: No results for input(s): TSH, T4TOTAL, FREET4, T3FREE, THYROIDAB in the last 72 hours. Anemia Panel: No results for input(s): VITAMINB12, FOLATE, FERRITIN, TIBC, IRON, RETICCTPCT in the last 72 hours. Urine analysis:    Component Value Date/Time   COLORURINE YELLOW 03/23/2015 1805   APPEARANCEUR CLEAR 03/23/2015 1805   LABSPEC <1.005 (L) 03/23/2015 1805   PHURINE 5.0 03/23/2015 1805   GLUCOSEU >1000 (A) 03/23/2015 1805   GLUCOSEU NEG mg/dL 03/04/2007 0829   HGBUR NEGATIVE 03/23/2015 1805   HGBUR negative 11/13/2010 1522    BILIRUBINUR NEGATIVE 03/23/2015 1805   KETONESUR NEGATIVE 03/23/2015 1805   PROTEINUR NEGATIVE 03/23/2015 1805   UROBILINOGEN 0.2 03/23/2015 1805   NITRITE NEGATIVE 03/23/2015  [place] 03/23/2015 1805   Sepsis Labs: @LABRCNTIP (procalcitonin:4,lacticidven:4) )No results found for this or any previous visit (from the past 240 hour(s)).   Radiological Exams on Admission: Dg Chest Portable 1 View  Result Date: 07/29/2017 CLINICAL DATA:  Productive cough . EXAM: PORTABLE CHEST 1 VIEW COMPARISON:  Chest x-ray 05/25/2017 . FINDINGS: Cardiomegaly with mild pulmonary vascular prominence and bilateral interstitial prominence consistent CHF. No pleural effusion or pneumothorax. IMPRESSION: Cardiomegaly with pulmonary venous congestion and bilateral interstitial prominence consistent with CHF. Electronically Signed   By: Marcello Moores  Register   On: 07/29/2017 07:37    EKG: Independently reviewed.   Assessment/Plan  1-Acute systolic congestive heart failure exacerbation with or edema: Improving with diuresis will continue Lasix 40 mg IV 3 times a day strict input and output with a fluid restriction continue beta blocker, aspirin, hold ACE inhibitor's not to provoke any renal injury and the stent of diuresis. 2-insulin-dependent diabetes : continue Lantus with NovoLog along with a sliding scale coverage. 3-history of COPD quit smoking continue home inhalers I don't think this is exacerbation most of his symptoms related to CHF.     DVT prophylaxis: Lovenox. Code Status: FULL Family Communication:WIFE Disposition Plan:HOME Consults called: Dr. Luan Pulling . Admission status: Inpatient .   Waldron Session MD [facility]    If 7PM-7AM, please contact night-coverage [email or web address] Password [facility]  07/29/2017, 10:44 AM

## 2017-07-29 NOTE — ED Notes (Signed)
Critical value  CBG 416  Dr. Burnis Medin informed.  Patient to get his dose of lantus now and 15 Units Novolog now.

## 2017-07-29 NOTE — ED Provider Notes (Signed)
[place] DEPT Provider Note   CSN: [id number] Arrival date & time: 07/29/17  0704     History   Chief Complaint Chief Complaint  Patient presents with  . Shortness of Breath    HPI Antonio Cobb is a 56 y.o. male.  HPI Patient presents with shortness of breath. Began around one of the morning but has been doing a little worse last few days. Upon arrival for EMS patient was found to have sats in the 80s. Shortness of breath has been worse lying down. Started on BiPAP for EMS. No fevers. Feeling somewhat better after breathing treatments and BiPAP. No chest pain. Past Medical History:  Diagnosis Date  . Alcohol abuse   . CAD (coronary artery disease)    DES to circumflex June 2013  . Cardiomyopathy (HCC)    LVEF 30-35%  . COPD (chronic obstructive pulmonary disease) ([place])   . Diabetes mellitus, type 2 ([place])   . Essential hypertension   . Mitral regurgitation    Moderate  . Mixed hyperlipidemia   . Myocardial infarction ([place]) 05/23/2012   Inferolateral STEMI  . Obesity     Patient Active Problem List   Diagnosis Date Noted  . Lumbar degenerative disc disease 06/20/2017  . Encounter for examination following treatment at hospital 06/14/2017  . Prolonged QT interval 10/07/2016  . Coronary artery disease involving coronary bypass graft of native heart with angina pectoris ([place])   . Abnormal echocardiogram   . Allergic rhinitis 05/06/2015  . NICM (nonischemic cardiomyopathy) ([place]) 05/23/2012  . Tobacco abuse 05/23/2012  . Alcohol abuse 05/23/2012  . Arteriosclerotic cardiovascular disease (ASCVD) 05/23/2012  . ED (erectile dysfunction) 05/10/2012  . Overweight 08/12/2010  . Diabetes mellitus, insulin dependent (IDDM), uncontrolled ([place]) 03/08/2008  . Hyperlipidemia LDL goal <70 03/08/2008  . Essential hypertension 03/08/2008    Past Surgical History:  Procedure Laterality Date  . CARDIAC CATHETERIZATION  2 yrs ago  . CARDIAC CATHETERIZATION N/A 05/15/2016    Procedure: Right/Left Heart Cath and Coronary Angiography;  Surgeon: [name], MD;  Location: [place] CV LAB;  Service: Cardiovascular;  Laterality: N/A;  . CORONARY STENT PLACEMENT  05/23/12  . LEFT HEART CATHETERIZATION WITH CORONARY ANGIOGRAM N/A 05/23/2012   Procedure: LEFT HEART CATHETERIZATION WITH CORONARY ANGIOGRAM;  Surgeon: [name], MD;  Location: [facility] CATH LAB;  Service: Cardiovascular;  Laterality: N/A;  . None    . PERCUTANEOUS CORONARY STENT INTERVENTION (PCI-S) N/A 05/23/2012   Procedure: PERCUTANEOUS CORONARY STENT INTERVENTION (PCI-S);  Surgeon: [name], MD;  Location: [facility] CATH LAB;  Service: Cardiovascular;  Laterality: N/A;  . POLYPECTOMY  10/16/2011   Procedure: POLYPECTOMY;  Surgeon: [name], MD;  Location: [facility];  Service: Endoscopy;;  Polypoid Lesion, Transverse and Sigmoid Colon       Home Medications    Prior to Admission medications   Medication Sig Start Date End Date Taking? Authorizing Provider  albuterol (PROVENTIL HFA;VENTOLIN HFA) 108 (90 Base) MCG/ACT inhaler Inhale 2 puffs into the lungs every 6 (six) hours as needed for wheezing or shortness of breath. 02/26/17   [name], MD  amLODipine (NORVASC) 10 MG tablet Take 1 tablet (10 mg total) by mouth daily. 05/12/17   [name], MD  aspirin (ASPIRIN ADULT LOW STRENGTH) 81 MG EC tablet Take 81 mg by mouth daily.      [provider]  atorvastatin (LIPITOR) 40 MG tablet Take 1 tablet (40 mg total) by mouth daily. 05/12/17   [name],  MD  budesonide-formoterol (SYMBICORT) 80-4.5 MCG/ACT inhaler Inhale 2 puffs into the lungs 2 (two) times daily. 02/26/17   [name], MD  FREESTYLE LITE test strip TEST four times a day 03/15/17   [name], MD  furosemide (LASIX) 40 MG tablet Take 1 tablet (40 mg total) by mouth daily. 05/12/17   [name], MD  HYDROcodone-acetaminophen (NORCO/VICODIN) 5-325 MG tablet Take 1 tablet by  mouth every 4 (four) hours as needed. 06/11/17   Lily Kocher, PA-C  insulin aspart (NOVOLOG) 100 UNIT/ML injection Inject 15 Units into the skin 3 (three) times daily before meals. Sliding scale begins at 15 units and increases as CBG dictates 03/23/15   Pollina, Gwenyth Allegra, MD  Insulin Glargine (LANTUS SOLOSTAR) 100 UNIT/ML Solostar Pen Inject 60 Units into the skin daily at 10 pm. Everynight    [provider]  ipratropium-albuterol (DUONEB) 0.5-2.5 (3) MG/3ML SOLN Take 3 mLs by nebulization every 6 (six) hours as needed. Patient taking differently: Take 3 mLs by nebulization every 6 (six) hours as needed (for wheezing or shortness of breath).  04/27/16   [name], MD  Lancets (FREESTYLE) lancets Four times daily testing dx e11.65 03/12/16   [name], MD  losartan (COZAAR) 25 MG tablet Take 12.5 mg by mouth daily.    [provider]  meloxicam (MOBIC) 15 MG tablet Take 1 tablet (15 mg total) by mouth daily. 06/11/17   Lily Kocher, PA-C  methocarbamol (ROBAXIN) 500 MG tablet Take 1 tablet (500 mg total) by mouth 3 (three) times daily. 06/11/17   Lily Kocher, PA-C  metoprolol succinate (TOPROL-XL) 50 MG 24 hr tablet take 1 tablet once daily with food 05/12/17   [name], MD  Multiple Vitamin (MULTIVITAMIN) capsule Take 1 capsule by mouth daily.    [provider]  nitroGLYCERIN (NITROSTAT) 0.4 MG SL tablet Place 1 tablet (0.4 mg total) under the tongue every 5 (five) minutes as needed for chest pain. 05/29/16 11/06/19  Lendon Colonel, NP  potassium chloride SA (KLOR-CON M20) 20 MEQ tablet Take 1 tablet (20 mEq total) by mouth daily. 05/12/17   [name], MD  spironolactone (ALDACTONE) 25 MG tablet Take 1 tablet (25 mg total) by mouth daily. 05/12/17   [name], MD  tiotropium (SPIRIVA HANDIHALER) 18 MCG inhalation capsule inhale the contents of one capsule in the handihaler once daily 05/12/17   [name],  MD    Family History Family History  Problem Relation Age of Onset  . Emphysema Mother   . Heart attack Mother   . Hypertension Mother   . Diabetes Mother   . Heart disease Mother   . COPD Mother   . Emphysema Father   . COPD Father   . Stroke Sister   . Asthma Sister   . Colon cancer Neg Hx   . Anesthesia problems Neg Hx   . Hypotension Neg Hx   . Malignant hyperthermia Neg Hx   . Pseudochol deficiency Neg Hx     Social History Social History  Substance Use Topics  . Smoking status: Former Smoker    Packs/day: 0.25    Years: 30.00    Types: Cigars    Quit date: 05/23/2016  . Smokeless tobacco: Never Used     Comment: since early 38s  . Alcohol use 0.0 oz/week     Comment: occ     Allergies   Patient has no known allergies.   Review  of Systems Review of Systems  Constitutional: Negative for appetite change.  HENT: Negative for congestion.   Respiratory: Positive for cough and shortness of breath.   Cardiovascular: Negative for chest pain.  Gastrointestinal: Negative for abdominal pain.  Genitourinary: Negative for frequency.  Musculoskeletal: Negative for back pain.  Skin: Negative for color change.  Neurological: Negative for syncope.  Psychiatric/Behavioral: Negative for confusion.     Physical Exam Updated Vital Signs BP 122/82   Pulse 94   Temp 98.3 F (36.8 C) (Oral)   Resp (!) 31   Ht 5\' 10"  (1.778 m)   Wt 94.8 kg (209 lb)   SpO2 99%   BMI 29.99 kg/m   Physical Exam  Constitutional: He appears well-developed.  HENT:  Head: Normocephalic.  Eyes: Pupils are equal, round, and reactive to light.  Neck: No JVD present.  Cardiovascular:  Mild tachycardia  Pulmonary/Chest: He has wheezes. He has rales.  Rales in bilateral bases with some underlying wheezes also.  Abdominal: There is no tenderness.  Musculoskeletal: He exhibits edema.  Mild edema to bilateral lower extremities but may be slightly increased in left compared to right.    Neurological: He is alert.  Skin: Skin is warm. Capillary refill takes less than 2 seconds.     ED Treatments / Results  Labs (all labs ordered are listed, but only abnormal results are displayed) Labs Reviewed  COMPREHENSIVE METABOLIC PANEL - Abnormal; Notable for the following:       Result Value   Chloride 97 (*)    Glucose, Bld 413 (*)    Creatinine, Ser 1.33 (*)    GFR calc non Af Amer 58 (*)    All other components within normal limits  TROPONIN I - Abnormal; Notable for the following:    Troponin I 0.04 (*)    All other components within normal limits  BRAIN NATRIURETIC PEPTIDE - Abnormal; Notable for the following:    B Natriuretic Peptide 315.0 (*)    All other components within normal limits  CBC WITH DIFFERENTIAL/PLATELET - Abnormal; Notable for the following:    WBC 11.9 (*)    Neutro Abs 10.5 (*)    All other components within normal limits    EKG  EKG Interpretation  Date/Time:  Thursday July 29 2017 07:12:04 EDT Ventricular Rate:  103 PR Interval:    QRS Duration: 93 QT Interval:  379 QTC Calculation: 497 R Axis:   15 Text Interpretation:  Sinus tachycardia Borderline T wave abnormalities Borderline prolonged QT interval Confirmed by Davonna Belling [phone]) on 07/29/2017 7:26:18 AM       Radiology Dg Chest Portable 1 View  Result Date: 07/29/2017 CLINICAL DATA:  Productive cough . EXAM: PORTABLE CHEST 1 VIEW COMPARISON:  Chest x-ray 05/25/2017 . FINDINGS: Cardiomegaly with mild pulmonary vascular prominence and bilateral interstitial prominence consistent CHF. No pleural effusion or pneumothorax. IMPRESSION: Cardiomegaly with pulmonary venous congestion and bilateral interstitial prominence consistent with CHF. Electronically Signed   By: Marcello Moores  Register   On: 07/29/2017 07:37    Procedures Procedures (including critical care time)  Medications Ordered in ED Medications  nitroGLYCERIN (NITROSTAT) SL tablet 0.4 mg (0.4 mg Sublingual Given  07/29/17 0750)  nitroGLYCERIN 50 mg in dextrose 5 % 250 mL (0.2 mg/mL) infusion (5 mcg/min Intravenous New Bag/Given 07/29/17 0801)  furosemide (LASIX) injection 40 mg (40 mg Intravenous Given 07/29/17 0802)     Initial Impression / Assessment and Plan / ED Course  I have reviewed the triage vital signs  and the nursing notes.  Pertinent labs & imaging results that were available during my care of the patient were reviewed by me and considered in my medical decision making (see chart for details).     Patient with shortness of breath. Appears to be in CHF. History same. Started on nitroglycerin drip. Also given Lasix. Hypoxia is improved somewhat. Troponin minimally elevated. Admit to internal medicine.   Final Clinical Impressions(s) / ED Diagnoses   Final diagnoses:  Congestive heart failure, unspecified HF chronicity, unspecified heart failure type [facility])    New Prescriptions New Prescriptions   No medications on file     Davonna Belling, MD 07/29/17 1000

## 2017-07-29 NOTE — ED Triage Notes (Signed)
Pt reports productive cough starting at 1am.  Denies pain.  Given Albuterol x 3 and Atrovent x1 by ems along with Solumedrol 125mg  IVP.  Pt states he is coughing up pink frothy sputum and is worse when lying down.  Pt placed on CPAP by ems.

## 2017-07-29 NOTE — ED Notes (Signed)
Patient transported to X-ray 

## 2017-07-29 NOTE — ED Notes (Signed)
Date and time results received: 07/29/17 9:48 AM  (use smartphrase ".now" to insert current time)  Test: Troponin Critical Value: 0.04  Name of Provider Notified: Alvino Chapel  Orders Received? Or Actions Taken?: Orders Received - See Orders for details

## 2017-07-30 ENCOUNTER — Ambulatory Visit (HOSPITAL_COMMUNITY): Payer: BLUE CROSS/BLUE SHIELD

## 2017-07-30 DIAGNOSIS — I509 Heart failure, unspecified: Secondary | ICD-10-CM | POA: Diagnosis not present

## 2017-07-30 DIAGNOSIS — I519 Heart disease, unspecified: Secondary | ICD-10-CM

## 2017-07-30 DIAGNOSIS — I5023 Acute on chronic systolic (congestive) heart failure: Secondary | ICD-10-CM | POA: Diagnosis not present

## 2017-07-30 DIAGNOSIS — J810 Acute pulmonary edema: Secondary | ICD-10-CM | POA: Diagnosis not present

## 2017-07-30 DIAGNOSIS — E1029 Type 1 diabetes mellitus with other diabetic kidney complication: Secondary | ICD-10-CM

## 2017-07-30 DIAGNOSIS — I10 Essential (primary) hypertension: Secondary | ICD-10-CM

## 2017-07-30 DIAGNOSIS — I25709 Atherosclerosis of coronary artery bypass graft(s), unspecified, with unspecified angina pectoris: Secondary | ICD-10-CM

## 2017-07-30 DIAGNOSIS — E1065 Type 1 diabetes mellitus with hyperglycemia: Secondary | ICD-10-CM

## 2017-07-30 DIAGNOSIS — Z9111 Patient's noncompliance with dietary regimen: Secondary | ICD-10-CM | POA: Diagnosis not present

## 2017-07-30 DIAGNOSIS — E7800 Pure hypercholesterolemia, unspecified: Secondary | ICD-10-CM

## 2017-07-30 LAB — COMPREHENSIVE METABOLIC PANEL
ALBUMIN: 3.4 g/dL — AB (ref 3.5–5.0)
ALT: 34 U/L (ref 17–63)
AST: 22 U/L (ref 15–41)
Alkaline Phosphatase: 76 U/L (ref 38–126)
Anion gap: 9 (ref 5–15)
BUN: 28 mg/dL — AB (ref 6–20)
CHLORIDE: 99 mmol/L — AB (ref 101–111)
CO2: 30 mmol/L (ref 22–32)
CREATININE: 1.2 mg/dL (ref 0.61–1.24)
Calcium: 8.9 mg/dL (ref 8.9–10.3)
GFR calc Af Amer: >60 mL/min (ref >60)
GFR calc non Af Amer: >60 mL/min (ref >60)
Glucose, Bld: 249 mg/dL — ABNORMAL HIGH (ref 65–99)
Potassium: 3.4 mmol/L — ABNORMAL LOW (ref 3.5–5.1)
SODIUM: 138 mmol/L (ref 135–145)
Total Bilirubin: 1.2 mg/dL (ref 0.3–1.2)
Total Protein: 7.2 g/dL (ref 6.5–8.1)

## 2017-07-30 LAB — GLUCOSE, CAPILLARY
Glucose-Capillary: 220 mg/dL — ABNORMAL HIGH (ref 65–99)
Glucose-Capillary: 174 mg/dL — ABNORMAL HIGH (ref 65–99)

## 2017-07-30 MED ORDER — FUROSEMIDE 10 MG/ML IJ SOLN
40.0000 mg | Freq: Two times a day (BID) | INTRAMUSCULAR | Status: DC
  Administered 2017-07-30: 40 mg via INTRAVENOUS
  Filled 2017-07-30: qty 4

## 2017-07-30 MED ORDER — LOSARTAN POTASSIUM 25 MG PO TABS
12.5000 mg | ORAL_TABLET | Freq: Every day | ORAL | Status: DC
  Administered 2017-07-30: 12.5 mg via ORAL
  Filled 2017-07-30: qty 1

## 2017-07-30 MED ORDER — INSULIN ASPART 100 UNIT/ML ~~LOC~~ SOLN
0.0000 [IU] | SUBCUTANEOUS | Status: DC
  Administered 2017-07-30: 4 [IU] via SUBCUTANEOUS
  Administered 2017-07-30: 7 [IU] via SUBCUTANEOUS

## 2017-07-30 NOTE — Consult Note (Signed)
Cardiology Consultation:   Patient ID: Antonio Cobb; [id number]; 1961/11/04   Admit date: 07/29/2017 Date of Consult: 07/30/2017  Primary Care Provider: [name], MD Primary Cardiologist: Branch    Patient Profile:   Antonio Cobb is a 56 y.o. male with a hx of CAD, Chronic Systolic CHF, Hyperlipidemia, HTN, who is being seen today for the evaluation of acute on chronic mixed CHF with progressive dyspnea and fluid retention, with a chest x-ray revealing pulmonary edema,  at the request of Dr.Eiad, Hospitalist.   History of Present Illness:   Antonio Cobb presented to the emergency room with worsening symptoms of dyspnea, edema, abdominal distention, and orthopnea. On arrival to the emergency room blood pressure 142/97 heart rate 106 O2 sat 99% he was tachypnea with respirations of 30. Pertinent labs revealed elevated blood glucose of 535, potassium 3.6, creatinine 1.33 troponin 0.04, without subsequent troponin draws since admission. EKG normal sinus rhythm with nonspecific T-wave abnormalities, (essentially unchanged from prior EKG October 2017). Chest x-ray revealed cardiomegaly with pulmonary venous congestion and bilateral interstitial prominence consistent with CHF.   He admits to eating a lot of fast food, salted food to include fatback, potato chips, and also eating a lot of watermelon. He has not been weighing daily. He states that his weight has gone up significantly over the last week with associated abdominal distention feelings of constipation, and worsening dyspnea first thing in the morning. He denies medical noncompliance. But states his diuretic is not been working well lately.  He was treated with nitroglycerin sublingual, Lasix 40 mg IV 2, breathing treatments, insulin, and admitted for diuresis and further treatment. He ascends diuresis 2.295 L since admission.He is uncertain what his dry weight is. He states he runs anywhere from 202-210 pounds. He is  feeling some better but remains dyspneic on exertion with some abdominal distention.  Past Medical History:  Diagnosis Date  . Alcohol abuse   . CAD (coronary artery disease)    DES to circumflex June 2013  . Cardiomyopathy (HCC)    LVEF 30-35%  . COPD (chronic obstructive pulmonary disease) ([place])   . Diabetes mellitus, type 2 ([place])   . Essential hypertension   . Mitral regurgitation    Moderate  . Mixed hyperlipidemia   . Myocardial infarction ([place]) 05/23/2012   Inferolateral STEMI  . Obesity     Past Surgical History:  Procedure Laterality Date  . CARDIAC CATHETERIZATION  2 yrs ago  . CARDIAC CATHETERIZATION N/A 05/15/2016   Procedure: Right/Left Heart Cath and Coronary Angiography;  Surgeon: [name], MD;  Location: [facility];  Service: Cardiovascular;  Laterality: N/A;  . CORONARY STENT PLACEMENT  05/23/12  . LEFT HEART CATHETERIZATION WITH CORONARY ANGIOGRAM N/A 05/23/2012   Procedure: LEFT HEART CATHETERIZATION WITH CORONARY ANGIOGRAM;  Surgeon: [name], MD;  Location: [facility] C[facility] LAB;  Service: Cardiovascular;  Laterality: N/A;  . None    . PERCUTANEOUS CORONARY STENT INTERVENTION (PCI-S) N/A 05/23/2012   Procedure: PERCUTANEOUS CORONARY STENT INTERVENTION (PCI-S);  Surgeon: [name], MD;  Location: [facility] CATH LAB;  Service: Cardiovascular;  Laterality: N/A;  . POLYPECTOMY  10/16/2011   Procedure: POLYPECTOMY;  Surgeon: [name], MD;  Location: [facility];  Service: Endoscopy;;  Polypoid Lesion, Transverse and Sigmoid Colon       Inpatient Medications: Scheduled Meds: . amLODipine  10 mg Oral Daily  . aspirin EC  81 mg Oral Daily  . atorvastatin  40 mg Oral Daily  .  enoxaparin (LOVENOX) injection  40 mg Subcutaneous Q24H  . furosemide  40 mg Intravenous Q12H  . insulin aspart  0-20 Units Subcutaneous Q4H  . insulin aspart  15 Units Subcutaneous TID AC  . insulin glargine  60 Units Subcutaneous Q2200  . metoprolol succinate  50 mg Oral Daily    . mometasone-formoterol  2 puff Inhalation BID  . multivitamin with minerals  1 tablet Oral Daily  . potassium chloride SA  20 mEq Oral Daily  . tiotropium  18 mcg Inhalation Daily   Continuous Infusions:  PRN Meds: albuterol, HYDROcodone-acetaminophen, nitroGLYCERIN  Allergies:   No Known Allergies  Social History:   Social History   Social History  . Marital status: Married    Spouse name: N/A  . Number of children: N/A  . Years of education: N/A   Occupational History  . [employer]   Social History Main Topics  . Smoking status: Former Smoker    Packs/day: 0.25    Years: 30.00    Types: Cigars    Quit date: 05/23/2016  . Smokeless tobacco: Never Used     Comment: since early 42s  . Alcohol use 0.0 oz/week     Comment: occ  . Drug use: No  . Sexual activity: Not on file   Other Topics Concern  . Not on file   Social History Narrative  . No narrative on file    Family History:    Family History  Problem Relation Age of Onset  . Emphysema Mother   . Heart attack Mother   . Hypertension Mother   . Diabetes Mother   . Heart disease Mother   . COPD Mother   . Emphysema Father   . COPD Father   . Stroke Sister   . Asthma Sister   . Colon cancer Neg Hx   . Anesthesia problems Neg Hx   . Hypotension Neg Hx   . Malignant hyperthermia Neg Hx   . Pseudochol deficiency Neg Hx      ROS:  Please see the history of present illness.  ROS All other ROS reviewed and negative.     Physical Exam/Data:   Vitals:   07/29/17 2022 07/29/17 2111 07/30/17 0639 07/30/17 0849  BP:  130/78 (!) 131/93   Pulse:  97 96   Resp:  18 16   Temp:  98.3 F (36.8 C) 98.4 F (36.9 C)   TempSrc:  Oral Oral   SpO2: 95% 97% 93% 96%  Weight:      Height:        Intake/Output Summary (Last 24 hours) at 07/30/17 0904 Last data filed at 07/30/17 0641  Gross per 24 hour  Intake              4.3 ml  Output             2300 ml  Net          -2295.7 ml    Filed Weights   07/29/17 0707  Weight: 209 lb (94.8 kg)   Body mass index is 29.99 kg/m.  General:  Well nourished, well developed, in no acute distress HEENT: normal Lymph: no adenopathy Neck: no JVD Endocrine:  No thryomegaly Vascular: No carotid bruits; FA pulses 2+ bilaterally without bruits  Cardiac:  normal S1, S2; RRR; no murmur  Lungs:  clear to auscultation bilaterally, no wheezing, rhonchi or rales  Abd: mild distention, nontender, no hepatomegaly  Ext: no edema Musculoskeletal:  No  deformities, BUE and BLE strength normal and equal Skin: warm and dry  Neuro:  CNs 2-12 intact, no focal abnormalities noted Psych:  Normal affect   EKG:  The EKG was personally reviewed and demonstrates: Normal sinus rhythm with nonspecific T-wave abnormalities  Telemetry:  Telemetry was personally reviewed and demonstrates:  Normal sinus rhythm  Relevant CV Studies: Echocardiogram 10/06/2016 Left ventricle: The cavity size was mildly dilated. Wall   thickness was increased in a pattern of mild LVH. Systolic   function was moderately to severely reduced. The estimated   ejection fraction was in the range of 30% to 35%. There is   akinesis of the inferolateral and inferior myocardium. Doppler   parameters are consistent with restrictive physiology, indicative   of decreased left ventricular diastolic compliance and/or   increased left atrial pressure. - Aortic valve: Mildly calcified annulus. Trileaflet. There was   trivial regurgitation. - Mitral valve: Mildly thickened leaflets . There was moderate   regurgitation directed posteriorly. - Left atrium: The atrium was mildly dilated. - Right atrium: Central venous pressure (est): 3 mm Hg. - Tricuspid valve: There was mild regurgitation. - Pulmonary arteries: PA peak pressure: 36 mm Hg (S). - Pericardium, extracardiac: There was no pericardial effusion.  Diagnostic Studies Cath 05/2012 [facility] FINDINGS  Hemodynamics:  AO  119/78  LV 121/71  Coronary angiography:  Coronary dominance: right  Left mainstem: The left main is patent with diffuse nonobstructive plaque. The distal left main has 30% stenosis.  Left anterior descending (LAD): the LAD is patent to the LV apex. The mid LAD has 50-60% stenosis at the origin of the second diagonal branch. The first diagonal is very large in caliber and has no significant obstructive disease.  Left circumflex (LCx): the left circumflex is totally occluded in the mid vessel. There is TIMI 0 flow. Following PCI, there were 2 obtuse marginals and a left posterolateral branch visualized, all of which are patent.  Right coronary artery (RCA): there is a high anterior origin of the RCA. The mid vessel has diffuse plaque estimated at 50-60%. The vessel is dominant. There is diffuse nonobstructive disease throughout. There is a small PDA and small posterolateral branch.  Left ventriculography: Left ventricular systolic function is in the low-normal range, LVEF is estimated at 55%, there is hypokinesis of the basal and midinferior wall.  PCI Note: Following the diagnostic procedure, the decision was made to proceed with PCI. The patient was loaded with Effient 60 mg. Weight-based bivalirudin was given for anticoagulation. Once a therapeutic ACT was achieved, a 6 [place] XB-LAD guide catheter was inserted. A Cougar coronary guidewire was used to cross the lesion. The lesion was predilated with a 2.5x15 mm balloon. The lesion was then stented with a 3.5x15 mm Resolute drug-eluting stent. Following PCI, there was 0% residual stenosis and TIMI-3 flow. Final angiography confirmed an excellent result. The patient tolerated the procedure well. There were no immediate procedural complications. A TR band was used for radial hemostasis. The patient was transferred to the post catheterization recovery area for further monitoring.  PCI Data:  Vessel - circumflex /Segment - mid  Percent Stenosis  (pre) 100  TIMI-flow 0  Stent 3.5 x 15 mm resolute integrity DES  Percent Stenosis (post) 0  TIMI-flow (post) 3  Final Conclusions:  1. Total occlusion of the left circumflex with successful primary PCI using a drug-eluting stent platform  2. Diffuse nonobstructive disease of the right coronary artery and LAD  3. Mild left ventricular dysfunction consistent  with inferior MI  Recommendations:  transfer to ICU. Post MI medical therapy will be instituted. Tobacco cessation counseling will be done. Anticipate 48 hour hospitalization if his post MI course is uncomplicated.   Laboratory Data:  Chemistry Recent Labs Lab 07/29/17 0837 07/30/17 0746  NA 136 138  K 3.6 3.4*  CL 97* 99*  CO2 29 30  GLUCOSE 413* 249*  BUN 20 28*  CREATININE 1.33* 1.20  CALCIUM 8.9 8.9  GFRNONAA 58* >60  GFRAA >60 >60  ANIONGAP 10 9     Recent Labs Lab 07/29/17 0837 07/30/17 0746  PROT 7.7 7.2  ALBUMIN 3.7 3.4*  AST 41 22  ALT 42 34  ALKPHOS 85 76  BILITOT 0.8 1.2   Hematology Recent Labs Lab 07/29/17 0837  WBC 11.9*  RBC 5.03  HGB 14.4  HCT 43.4  MCV 86.3  MCH 28.6  MCHC 33.2  RDW 13.3  PLT 221   Cardiac Enzymes Recent Labs Lab 07/29/17 0837  TROPONINI 0.04*   No results for input(s): TROPIPOC in the last 168 hours.  BNP Recent Labs Lab 07/29/17 0837  BNP 315.0*    DDimer No results for input(s): DDIMER in the last 168 hours.  Radiology/Studies:  Dg Chest Portable 1 View  Result Date: 07/29/2017 CLINICAL DATA:  Productive cough . EXAM: PORTABLE CHEST 1 VIEW COMPARISON:  Chest x-ray 05/25/2017 . FINDINGS: Cardiomegaly with mild pulmonary vascular prominence and bilateral interstitial prominence consistent CHF. No pleural effusion or pneumothorax. IMPRESSION: Cardiomegaly with pulmonary venous congestion and bilateral interstitial prominence consistent with CHF. Electronically Signed   By: Marcello Moores  Register   On: 07/29/2017 07:37    Assessment and Plan:  1.  Acute on chronic mixed CHF: Most recent echocardiogram in 2017 revealed reduced EF of 30-35%. The patient has pulmonary edema on admission and has diuresed greater than 2 L. He is feeling better. He admits to dietary noncompliance and also not weighing himself daily. Uncertain of dry weight. He states it's been as low as 202 pounds. The patient will continue IV Lasix, with daily weights and strict I&O. He will need dietary consult to reinforce low sodium diet. Repeat echocardiogram for reevaluation of LV systolic function. Current creatinine 1.20. We'll replace potassium.   2. Hypertension: Improved with diuresis, but not optimal with most recent evaluation of LV systolic function. Repeating echo for assistance with medical management. Continue current regimen at this time.   3. CAD: Most recent cardiac catheterization revealed stent to the left circumflex, and nonobstructive disease elsewhere. Depending upon evaluation of LV function, may need to consider further testing. We'll await his evaluation by Dr. Bronson Ing. I have not planned a stress test at this time due to his current status of ongoing breathing difficulties.  4. . Uncontrolled diabetes: Elevated on admission. The patient states that he had not taken his insulin the day before because he had not eaten.  5. Chronic dyspnea on exertion: May be related to weight, he has not had a sleep study completed that I could find. May need to consider this, in addition to ongoing management of chronic heart failure.     Signed, Jory Sims, NP  07/30/2017 9:04 AM   The patient was seen and examined, and I agree with the history, physical exam, assessment and plan as documented above, with modifications as noted below. I have also personally reviewed all relevant documentation, old records, labs, and both radiographic and cardiovascular studies. I have also independently interpreted old and new ECG's.  56  yr old male with nonobstructive CAD  (cath in 05/2016) and prior inferolateral STEMI with DES to circumflex and moderately reduced LV systolic function/chronic systolic heart failure admitted with 2 week history of abdominal bloating and progressive exertional dyspnea, worse in the last 3 days. He says he watches sodium intake but eats potato chips, eats at restaurants, and eats fatback. He has been admitted with acute on chronic systolic heart failure.  Pertinent labs: mild leukocytosis with left shift (11.9), BNP 315, troponin 0.04, glucose 413.  He has diuresed 2.3 L on IV Lasix and is feeling much better. Physical exam notable for 3/6 apical holosystolic murmur and bibasilar crackles.  As per Dr. Nelly Laurence note, he did not tolerate Entresto in the past as it led to dizziness.   Recommendations: Continue IV Lasix. I'll give him another dose now (bibasilar crackles on exam) and then I would recommend resuming oral Lasix and spironolactone. Continue other medical therapy with ASA, Lipitor, amlodipine, and metoprolol succinate.  Neither losartan nor spironolactone were ordered at time of admission due to concern for worsening renal dysfunction with IV diuresis. However, this will alter intracardiac hemodynamics and is not an optimal strategy (creatinine 1.2 today). BUN up to 28. I will resume losartan (DBP 93 mmHg, goal BP <130/80) with HFrEF. Echocardiogram was already ordered by Dr. Harl Bowie so if it is not performed in inpatient setting, it has already been scheduled to be performed in outpatient setting.  Agree with nutrition consult for dietary education as he has poor insight regarding this.   Kate Sable, MD, [facility]  07/30/2017 10:52 AM

## 2017-07-30 NOTE — Plan of Care (Signed)
Problem: Food- and Nutrition-Related Knowledge Deficit (NB-1.1) Goal: Nutrition education Formal process to instruct or train a patient/client in a skill or to impart knowledge to help patients/clients voluntarily manage or modify food choices and eating behavior to maintain or improve health. Outcome: Adequate for Discharge Nutrition Education Note  RD consulted for nutrition education regarding new onset CHF.  RD provided "Heart Faliure Nutrition Therapy" handout.  Reviewed patient's dietary recall. He really enjoys cheese, packaged meals such as Hamburger Helper and canned meats such as Spam and sardines.  Provided examples on ways to decrease sodium intake in diet. Discouraged intake of processed foods and use of salt shaker. Encouraged fresh fruits and vegetables as well as whole grain sources of carbohydrates to maximize fiber intake.   RD discussed why it is important for patient to adhere to diet recommendations, and emphasized the role of fluids, foods to avoid, and importance of weighing self daily. He was weighing himself at one time but has gotten out of the habit. Teach back method used.  Expect good compliance. The patient is already conscious about some of the general recommendations such as not using salt shaker. He understands that processed meats are higher in sodium has been "soaking" his ham before cooking. We talked about limiting or avoiding these types of items from his usual intake. especially emphasized label reading since he has not been actively doing this and encouraged him to avoid foods that are 300 mg or higher per serving and reiterated the importance of awareness - serving sizes in relation to sodium intake.   Body mass index is 29.99 kg/m. Pt meets criteria for overweight based on current BMI.  Current diet order is Heart Healthy/ CHO modified, patient is consuming approximately >75% of meals at this time. Labs and medications reviewed. No further nutrition  interventions warranted at this time. RD contact information provided. If additional nutrition issues arise, please re-consult RD.   Colman Cater MS,RD,CSG,LDN Office: [phone]

## 2017-07-30 NOTE — Progress Notes (Signed)
Inpatient Diabetes Program Recommendations  AACE/ADA: New Consensus Statement on Inpatient Glycemic Control (2015)  Target Ranges:  Prepandial:   less than 140 mg/dL      Peak postprandial:   less than 180 mg/dL (1-2 hours)      Critically ill patients:  140 - 180 mg/dL   Lab Results  Component Value Date   GLUCAP 220 (H) 07/30/2017   HGBA1C 10.4 (H) 02/09/2017    Review of Glycemic Control  Results for CHANE, COWDEN (MRN [id number]) as of 07/30/2017 10:51  Ref. Range 07/29/2017 14:45 07/29/2017 15:44 07/29/2017 21:13 07/30/2017 07:48  Glucose-Capillary Latest Ref Range: 65 - 99 mg/dL 416 (H) 535 (HH) 444 (H) 220 (H)    Diabetes history: Type 2 Outpatient Diabetes medications: Lantus 60 units qhs  Current orders for Inpatient glycemic control: Lantus 60 units qhs, Novolog 15 units tid, Novolog 0-20 units tid  Inpatient Diabetes Program Recommendations:  Consider adding Novolog 0-5 units qhs.   Gentry Fitz, RN, BA, MHA, CDE Diabetes Coordinator Inpatient Diabetes Program  [phone] (Team Pager) [phone] ([place]) 07/30/2017 10:57 AM

## 2017-07-30 NOTE — Care Management Note (Signed)
Case Management Note  Patient Details  Name: Antonio Cobb MRN: [id number] Date of Birth: [birth year]  Subjective/Objective:                 Admitted with pulm edema. Chart reviewed for CM needs. Pt from home, lives with family. He's ind has PCP, transportation and insurance with drug coverage.    Action/Plan: DC home today with self care. No CM needs noted prior to DC.   Expected Discharge Date:  07/30/17               Expected Discharge Plan:  Home/Self Care  In-House Referral:  NA  Discharge planning Services  CM Consult  Post Acute Care Choice:  NA Choice offered to:  NA  Status of Service:  Completed, signed off Sherald Barge, RN 07/30/2017, 11:38 AM

## 2017-07-30 NOTE — Discharge Instructions (Signed)
May return to work on 08/02/17 without restrictions.

## 2017-07-30 NOTE — Discharge Summary (Signed)
Physician Discharge Summary  Antonio Cobb YHC:[id number] DOB: 1961/10/23 DOA: 07/29/2017  PCP: [name], MD  Admit date: 07/29/2017 Discharge date: 07/30/2017  Admitted From:Home  Disposition:  (Home )  Recommendations for Outpatient Follow-up:  1. Follow up with PCP in 1 week. 2. Follow up with cards in week .   Home Health: NO Equipment/Devices: NONE    Discharge Condition: STABLE.  CODE STATUS:(FULL)   Diet recommendation: Heart Healthy / Carb Modified    Brief/Interim Summary:  55 y.o. 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 He was admitted for treatment of acute CHF exacerbation with pulmonary edema required aggressive diuresis with Lasix with negative input and output and subsequent improvement resultant shortness of breath. He was ambulating freely on room air with mild shortness of breath I switch back to oral Lasix /  Aldactone he was taking  as an outpatient recommended fluid restriction and eliminate  the watermelon and the salt intake as he was doing before.  Discharge Diagnoses:   1-Acute systolic congestive heart failure exacerbation with or edema: Improvied with diuresis , will discharge him home on oral Lasix /Aldactone/ Cozaar/ metoprolol aspirin/statin  follow-up with echo as outpatient per cardiology recommendation. 2-insulin-dependent diabetes : continue Lantus with NovoLog along with a sliding scale coverage. 3-history of COPD :  he quit smoking , continue home inhalers follow-up with the pulmonary as outpatient.   Discharge Instructions  Discharge Instructions    Diet - low sodium heart healthy    Complete by:  As directed     Diet Carb Modified    Complete by:  As directed    Increase activity slowly    Complete by:  As directed      Allergies as of 07/30/2017   No Known Allergies     Medication List    STOP taking these medications   meloxicam 15 MG tablet Commonly known as:  MOBIC     TAKE these medications   albuterol 108 (90 Base) MCG/ACT inhaler Commonly known as:  PROVENTIL HFA;VENTOLIN HFA Inhale 2 puffs into the lungs every 6 (six) hours as needed for wheezing or shortness of breath.   amLODipine 10 MG tablet Commonly known as:  NORVASC Take 1 tablet (10 mg total) by mouth daily.   ASPIRIN ADULT LOW STRENGTH 81 MG EC tablet Generic drug:  aspirin Take 81 mg by mouth daily.   atorvastatin 40 MG tablet Commonly known as:  LIPITOR Take 1 tablet (40 mg total) by mouth daily.   budesonide-formoterol 80-4.5 MCG/ACT inhaler Commonly known as:  SYMBICORT Inhale 2 puffs into the lungs 2 (two) times daily.   docusate sodium 100 MG capsule Commonly known as:  COLACE Take 100 mg by mouth 2 (two) times daily.   freestyle lancets Four times daily testing dx e11.65   FREESTYLE LITE test strip Generic drug:  glucose blood TEST four times a day   furosemide 40 MG tablet Commonly known as:  LASIX Take 1 tablet (40 mg total) by mouth daily.   HYDROcodone-acetaminophen 5-325 MG tablet Commonly known as:  NORCO/VICODIN Take 1 tablet by mouth every 4 (four) hours as needed.   insulin aspart 100 UNIT/ML injection Commonly known as:  novoLOG Inject 15  Units into the skin 3 (three) times daily before meals. Sliding scale begins at 15 units and increases as CBG dictates   ipratropium-albuterol 0.5-2.5 (3) MG/3ML Soln Commonly known as:  DUONEB Take 3 mLs by nebulization every 6 (six) hours as needed. What changed:  reasons to take this   LANTUS SOLOSTAR 100 UNIT/ML Solostar Pen Generic drug:  Insulin Glargine Inject 60 Units into the skin daily at 10 pm. Everynight   losartan 25 MG  tablet Commonly known as:  COZAAR Take 12.5 mg by mouth daily.   methocarbamol 500 MG tablet Commonly known as:  ROBAXIN Take 1 tablet (500 mg total) by mouth 3 (three) times daily.   metoprolol succinate 50 MG 24 hr tablet Commonly known as:  TOPROL-XL take 1 tablet once daily with food   multivitamin capsule Take 1 capsule by mouth daily.   nitroGLYCERIN 0.4 MG SL tablet Commonly known as:  NITROSTAT Place 1 tablet (0.4 mg total) under the tongue every 5 (five) minutes as needed for chest pain.   potassium chloride SA 20 MEQ tablet Commonly known as:  KLOR-CON M20 Take 1 tablet (20 mEq total) by mouth daily.   PRESCRIPTION MEDICATION Take 5 mLs by mouth daily as needed. Magic mouthwash with nystatin   spironolactone 25 MG tablet Commonly known as:  ALDACTONE Take 1 tablet (25 mg total) by mouth daily.   tiotropium 18 MCG inhalation capsule Commonly known as:  SPIRIVA HANDIHALER inhale the contents of one capsule in the handihaler once daily       No Known Allergies  Consultations:  Cards    Procedures/Studies: Dg Chest Portable 1 View  Result Date: 07/29/2017 CLINICAL DATA:  Productive cough . EXAM: PORTABLE CHEST 1 VIEW COMPARISON:  Chest x-ray 05/25/2017 . FINDINGS: Cardiomegaly with mild pulmonary vascular prominence and bilateral interstitial prominence consistent CHF. No pleural effusion or pneumothorax. IMPRESSION: Cardiomegaly with pulmonary venous congestion and bilateral interstitial prominence consistent with CHF. Electronically Signed   By: Marcello Moores  Register   On: 07/29/2017 07:37    (Echo, Carotid, EGD, Colonoscopy, ERCP)    Subjective:   Discharge Exam: Vitals:   07/30/17 0639 07/30/17 0849  BP: (!) 131/93   Pulse: 96   Resp: 16   Temp: 98.4 F (36.9 C)   SpO2: 93% 96%   Vitals:   07/29/17 2022 07/29/17 2111 07/30/17 0639 07/30/17 0849  BP:  130/78 (!) 131/93   Pulse:  97 96   Resp:  18 16   Temp:  98.3 F (36.8 C) 98.4 F (36.9  C)   TempSrc:  Oral Oral   SpO2: 95% 97% 93% 96%  Weight:      Height:        General: Pt is alert, awake, not in acute distress Cardiovascular: RRR, S1/S2 +, no rubs, no gallops Respiratory: CTA bilaterally, no wheezing, no rhonchi Abdominal: Soft, NT, ND, bowel sounds + Extremities: no edema, no cyanosis    The results of significant diagnostics from this hospitalization (including imaging, microbiology, ancillary and laboratory) are listed below for reference.     Microbiology: No results found for this or any previous visit (from the past 240 hour(s)).   Labs: BNP (last 3 results)  Recent Labs  10/07/16 0700 07/29/17 0837  BNP 374.0* 616.0*   Basic Metabolic Panel:  Recent Labs Lab 07/29/17 0837 07/30/17 0746  NA 136 138  K 3.6 3.4*  CL 97* 99*  CO2 29 30  GLUCOSE 413* 249*  BUN 20 28*  CREATININE 1.33* 1.20  CALCIUM 8.9 8.9   Liver Function Tests:  Recent Labs Lab 07/29/17 0837 07/30/17 0746  AST 41 22  ALT 42 34  ALKPHOS 85 76  BILITOT 0.8 1.2  PROT 7.7 7.2  ALBUMIN 3.7 3.4*   No results for input(s): LIPASE, AMYLASE in the last 168 hours. No results for input(s): AMMONIA in the last 168 hours. CBC:  Recent Labs Lab 07/29/17 0837  WBC 11.9*  NEUTROABS 10.5*  HGB 14.4  HCT 43.4  MCV 86.3  PLT 221   Cardiac Enzymes:  Recent Labs Lab 07/29/17 0837  TROPONINI 0.04*   BNP: Invalid input(s): POCBNP CBG:  Recent Labs Lab 07/29/17 1445 07/29/17 1544 07/29/17 2113 07/30/17 0748  GLUCAP 416* 535* 444* 220*   D-Dimer No results for input(s): DDIMER in the last 72 hours. Hgb A1c No results for input(s): HGBA1C in the last 72 hours. Lipid Profile No results for input(s): CHOL, HDL, LDLCALC, TRIG, CHOLHDL, LDLDIRECT in the last 72 hours. Thyroid function studies No results for input(s): TSH, T4TOTAL, T3FREE, THYROIDAB in the last 72 hours.  Invalid input(s): FREET3 Anemia work up No results for input(s): VITAMINB12,  FOLATE, FERRITIN, TIBC, IRON, RETICCTPCT in the last 72 hours. Urinalysis    Component Value Date/Time   COLORURINE YELLOW 03/23/2015 1805   APPEARANCEUR CLEAR 03/23/2015 1805   LABSPEC <1.005 (L) 03/23/2015 1805   PHURINE 5.0 03/23/2015 1805   GLUCOSEU >1000 (A) 03/23/2015 1805   GLUCOSEU NEG mg/dL 03/04/2007 0829   HGBUR NEGATIVE 03/23/2015 1805   HGBUR negative 11/13/2010 1522   BILIRUBINUR NEGATIVE 03/23/2015 1805   KETONESUR NEGATIVE 03/23/2015 1805   PROTEINUR NEGATIVE 03/23/2015 1805   UROBILINOGEN 0.2 03/23/2015 1805   NITRITE NEGATIVE 03/23/2015 1805   LEUKOCYTESUR NEGATIVE 03/23/2015 1805   Sepsis Labs Invalid input(s): PROCALCITONIN,  WBC,  LACTICIDVEN Microbiology No results found for this or any previous visit (from the past 240 hour(s)).   Time coordinating discharge: Over 30 minutes  SIGNED:   Waldron Session, MD  [facility] 07/30/2017, 11:18 AM  If 7PM-7AM, please contact night-coverage [email or web address] Password [email or web address]

## 2017-07-30 NOTE — Consult Note (Signed)
I was going in to see him and his attending physician Dr.Sabia told me that he was going to cancel the consult because he felt like this was heart failure and the patient was being discharged.

## 2017-08-02 ENCOUNTER — Encounter: Payer: Self-pay | Admitting: Family Medicine

## 2017-08-02 ENCOUNTER — Ambulatory Visit (INDEPENDENT_AMBULATORY_CARE_PROVIDER_SITE_OTHER): Payer: BLUE CROSS/BLUE SHIELD | Admitting: Family Medicine

## 2017-08-02 VITALS — BP 120/80 | HR 92 | Resp 17 | Ht 70.0 in | Wt 208.0 lb

## 2017-08-02 DIAGNOSIS — Z09 Encounter for follow-up examination after completed treatment for conditions other than malignant neoplasm: Secondary | ICD-10-CM | POA: Diagnosis not present

## 2017-08-02 DIAGNOSIS — E785 Hyperlipidemia, unspecified: Secondary | ICD-10-CM | POA: Diagnosis not present

## 2017-08-02 DIAGNOSIS — I10 Essential (primary) hypertension: Secondary | ICD-10-CM | POA: Diagnosis not present

## 2017-08-02 NOTE — Patient Instructions (Signed)
F/u in October as before  Work excuse from 8/17 to return 08/04/2017   Please weigh daily  Limit salt in diet   No canned foods  No smoked meat and fat back  If you gain 2 pounds need to increase fluid pills  Need education re heart failure     Limit fluid intake to 2 liters per day  NO alcohol , no cgarettes

## 2017-08-08 ENCOUNTER — Other Ambulatory Visit: Payer: Self-pay | Admitting: Family Medicine

## 2017-08-08 ENCOUNTER — Encounter: Payer: Self-pay | Admitting: Family Medicine

## 2017-08-08 NOTE — Assessment & Plan Note (Signed)
Hyperlipidemia:Low fat diet discussed and encouraged.   Lipid Panel  Lab Results  Component Value Date   CHOL 140 02/09/2017   HDL 26 (L) 02/09/2017   LDLCALC 87 02/09/2017   LDLDIRECT TEST NOT PERFORMED 06/19/2011   TRIG 133 02/09/2017   CHOLHDL 5.4 (H) 02/09/2017   Not at goal, Updated lab needed at/ before next visit.

## 2017-08-08 NOTE — Assessment & Plan Note (Signed)
Controlled, no change in medication DASH diet and commitment to daily physical activity for a minimum of 30 minutes discussed and encouraged, as a part of hypertension management. The importance of attaining a healthy weight is also discussed.  BP/Weight 08/02/2017 07/30/2017 07/29/2017 06/29/2017 06/14/2017 06/11/2017 05/11/4131  Systolic BP 440 102 - 725 366 440 347  Diastolic BP 80 93 - 86 76 94 70  Wt. (Lbs) 208 - 209 207 211.25 209 209  BMI 29.84 - 29.99 29.7 30.31 29.99 29.99

## 2017-08-08 NOTE — Progress Notes (Signed)
   Antonio Cobb     MRN: [id number]      DOB: June 10, 1961   HPI Antonio Cobb is here for follow up of recent hospitalization from 8/16 to 07/30/2017 when he presented with acute heart failure. Although he has been slowly recovering, feeling somewhat tired , he went o work this morning and became dizzy and light headed He does admit to over indulgence in potato chips and smoked meat/fatback the evening that he decompensated. States he experienced acute chest tightness, could neither speak nor walk ROS Denies recent fever or chills. Denies sinus pressure, nasal congestion, ear pain or sore throat. Denies chest congestion, productive cough or wheezing.  Denies abdominal pain, nausea, vomiting,diarrhea or constipation.   Denies dysuria, frequency, hesitancy or incontinence. Denies joint pain, swelling and limitation in mobility. Denies headaches, seizures, numbness, or tingling. Denies depression, anxiety or insomnia. Denies skin break down or rash.   PE  BP 120/80   Pulse 92   Resp 17   Ht 5\' 10"  (1.778 m)   Wt 208 lb (94.3 kg)   SpO2 95%   BMI 29.84 kg/m   Patient alert and oriented and in no cardiopulmonary distress.  HEENT: No facial asymmetry, EOMI,   oropharynx pink and moist.  Neck supple no JVD, no mass.  Chest: Adequate air entry,  Bibasilar crackles  CVS: S1, S2 no murmurs, no S3.Regular rate.  ABD: Soft non tender.   Ext: No edema  MS: Adequate ROM spine, shoulders, hips and knees.  Skin: Intact, no ulcerations or rash noted.  Psych: Good eye contact, normal affect. Memory intact not anxious or depressed appearing.  CNS: CN 2-12 intact, power,  normal throughout.no focal deficits noted.   Assessment & Plan  Encounter for examination following treatment at hospital Improved clinically, however,not well enough to return to work for an additional 2 days. Re educated re basics of heart failure management and the need to abstain from alcohol , which he  states he drank as recently as the day he was admitted to the hospital  Essential hypertension Controlled, no change in medication DASH diet and commitment to daily physical activity for a minimum of 30 minutes discussed and encouraged, as a part of hypertension management. The importance of attaining a healthy weight is also discussed.  BP/Weight 08/02/2017 07/30/2017 07/29/2017 06/29/2017 06/14/2017 06/11/2017 2/83/1517  Systolic BP 616 073 - 710 626 948 546  Diastolic BP 80 93 - 86 76 94 70  Wt. (Lbs) 208 - 209 207 211.25 209 209  BMI 29.84 - 29.99 29.7 30.31 29.99 29.99       Diabetes mellitus, insulin dependent (IDDM), uncontrolled ([place]) Needs to commit to managing his diabetes and seeing endo as he needs to , extremely uncontrolled   Hyperlipidemia LDL goal <70 Hyperlipidemia:Low fat diet discussed and encouraged.   Lipid Panel  Lab Results  Component Value Date   CHOL 140 02/09/2017   HDL 26 (L) 02/09/2017   LDLCALC 87 02/09/2017   LDLDIRECT TEST NOT PERFORMED 06/19/2011   TRIG 133 02/09/2017   CHOLHDL 5.4 (H) 02/09/2017   Not at goal, Updated lab needed at/ before next visit.

## 2017-08-08 NOTE — Assessment & Plan Note (Signed)
Needs to commit to managing his diabetes and seeing endo as he needs to , extremely uncontrolled

## 2017-08-08 NOTE — Assessment & Plan Note (Signed)
Improved clinically, however,not well enough to return to work for an additional 2 days. Re educated re basics of heart failure management and the need to abstain from alcohol , which he states he drank as recently as the day he was admitted to the hospital

## 2017-08-12 ENCOUNTER — Encounter: Payer: Self-pay | Admitting: Cardiology

## 2017-08-12 ENCOUNTER — Ambulatory Visit (INDEPENDENT_AMBULATORY_CARE_PROVIDER_SITE_OTHER): Payer: BLUE CROSS/BLUE SHIELD | Admitting: Cardiology

## 2017-08-12 VITALS — BP 122/80 | HR 84 | Ht 69.5 in | Wt 209.0 lb

## 2017-08-12 DIAGNOSIS — I2510 Atherosclerotic heart disease of native coronary artery without angina pectoris: Secondary | ICD-10-CM

## 2017-08-12 DIAGNOSIS — I10 Essential (primary) hypertension: Secondary | ICD-10-CM

## 2017-08-12 DIAGNOSIS — I5022 Chronic systolic (congestive) heart failure: Secondary | ICD-10-CM | POA: Diagnosis not present

## 2017-08-12 DIAGNOSIS — E782 Mixed hyperlipidemia: Secondary | ICD-10-CM | POA: Diagnosis not present

## 2017-08-12 NOTE — Progress Notes (Signed)
Clinical Summary Antonio Cobb is a 56 y.o.male seen today for follow up of the following medical problems.   1. CAD - hx of inferolateral STEMI June 2013, received DES to LCX. LVEF at that time 55%.  - echo 04/2016 LVEF 30-35% - 05/2016 cath patent LCX stent, moderate RCA and LAD disease.   - no recent chest pain  2. Chronic systolic HF - drop in LVEF noted by echo 04/2016, cath at that time did not show any new significant obstructive CAD - 09/2016 echo LVEF 30-35%, moderate MR.   - entresto caused cough and dizzy spells, now just on losartan.  - DOE variable, worst in the morning. Can have some occasional LE edema - compliant with meds. Limiting sodium intake. Not checking weights regularly.   - recent admission with acute systolic HF 03/7828. Diuresed, discharge weight not documented - primarily had some abdominal distension, SOB. He reports may have had some increased sodium. CXR with pulmonary - has repeat echo pending - checking weights daily, stable around 202-207 lbs. Working to limit sodium. Compliant with meds - walking 2 miles daily.    2. Hyperlipidemia - compliant with statin. Stopped statin for a period of time, now back on  for a few weeks.  01/2017 TC 140 TG 133 HDL 26 LDL 87  3. HTN - compliant with meds  4. COPD - followed by pcp and Dr Luan Pulling - recent 06/2017 PFTs mild to moderate ventilaroty defect  5. OSA screen +snoring, no apneic episodes, can have some day time somnolence.  - reports sleep study 5 years ago at [facility], reports it was normal.    Past Medical History:  Diagnosis Date  . Alcohol abuse   . CAD (coronary artery disease)    DES to circumflex June 2013  . Cardiomyopathy (HCC)    LVEF 30-35%  . COPD (chronic obstructive pulmonary disease) ([place])   . Diabetes mellitus, type 2 ([place])   . Essential hypertension   . Mitral regurgitation    Moderate  . Mixed hyperlipidemia   . Myocardial infarction ([place]) 05/23/2012   Inferolateral STEMI  . Obesity      No Known Allergies   Current Outpatient Prescriptions  Medication Sig Dispense Refill  . albuterol (PROVENTIL HFA;VENTOLIN HFA) 108 (90 Base) MCG/ACT inhaler Inhale 2 puffs into the lungs every 6 (six) hours as needed for wheezing or shortness of breath. 18 g 0  . amLODipine (NORVASC) 10 MG tablet Take 1 tablet (10 mg total) by mouth daily. 90 tablet 1  . aspirin (ASPIRIN ADULT LOW STRENGTH) 81 MG EC tablet Take 81 mg by mouth daily.      [name] atorvastatin (LIPITOR) 40 MG tablet Take 1 tablet (40 mg total) by mouth daily. 90 tablet 1  . budesonide-formoterol (SYMBICORT) 80-4.5 MCG/ACT inhaler Inhale 2 puffs into the lungs 2 (two) times daily. 1 Inhaler 3  . docusate sodium (COLACE) 100 MG capsule Take 100 mg by mouth 2 (two) times daily.    [name] FREESTYLE LITE test strip TEST four times a day 100 each 6  . furosemide (LASIX) 40 MG tablet Take 1 tablet (40 mg total) by mouth daily. 90 tablet 1  . insulin aspart (NOVOLOG) 100 UNIT/ML injection Inject 15 Units into the skin 3 (three) times daily before meals. Sliding scale begins at 15 units and increases as CBG dictates 10 mL 0  . Insulin Glargine (LANTUS SOLOSTAR) 100 UNIT/ML Solostar Pen Inject 60 Units into the skin daily at  10 pm. Everynight    . ipratropium-albuterol (DUONEB) 0.5-2.5 (3) MG/3ML SOLN Take 3 mLs by nebulization every 6 (six) hours as needed. (Patient taking differently: Take 3 mLs by nebulization every 6 (six) hours as needed (for wheezing or shortness of breath). ) 360 mL 0  . Lancets (FREESTYLE) lancets Four times daily testing dx e11.65 150 each 1  . losartan (COZAAR) 25 MG tablet Take 12.5 mg by mouth daily.    . methocarbamol (ROBAXIN) 500 MG tablet Take 1 tablet (500 mg total) by mouth 3 (three) times daily. 21 tablet 0  . metoprolol succinate (TOPROL-XL) 50 MG 24 hr tablet take 1 tablet once daily with food 90 tablet 1  . Multiple Vitamin (MULTIVITAMIN) capsule Take 1 capsule by mouth  daily.    . nitroGLYCERIN (NITROSTAT) 0.4 MG SL tablet Place 1 tablet (0.4 mg total) under the tongue every 5 (five) minutes as needed for chest pain. 25 tablet 3  . potassium chloride SA (KLOR-CON M20) 20 MEQ tablet Take 1 tablet (20 mEq total) by mouth daily. 90 tablet 1  . PRESCRIPTION MEDICATION Take 5 mLs by mouth daily as needed. Magic mouthwash with nystatin    . spironolactone (ALDACTONE) 25 MG tablet Take 1 tablet (25 mg total) by mouth daily. 90 tablet 1  . temazepam (RESTORIL) 7.5 MG capsule take 1 capsule by mouth at bedtime if needed 30 capsule 3  . tiotropium (SPIRIVA HANDIHALER) 18 MCG inhalation capsule inhale the contents of one capsule in the handihaler once daily 90 capsule 1   No current facility-administered medications for this visit.      Past Surgical History:  Procedure Laterality Date  . CARDIAC CATHETERIZATION  2 yrs ago  . CARDIAC CATHETERIZATION N/A 05/15/2016   Procedure: Right/Left Heart Cath and Coronary Angiography;  Surgeon: [name], MD;  Location: [facility];  Service: Cardiovascular;  Laterality: N/A;  . CORONARY STENT PLACEMENT  05/23/12  . LEFT HEART CATHETERIZATION WITH CORONARY ANGIOGRAM N/A 05/23/2012   Procedure: LEFT HEART CATHETERIZATION WITH CORONARY ANGIOGRAM;  Surgeon: [name], MD;  Location: [facility] CATH LAB;  Service: Cardiovascular;  Laterality: N/A;  . None    . PERCUTANEOUS CORONARY STENT INTERVENTION (PCI-S) N/A 05/23/2012   Procedure: PERCUTANEOUS CORONARY STENT INTERVENTION (PCI-S);  Surgeon: [name], MD;  Location: [facility] CATH LAB;  Service: Cardiovascular;  Laterality: N/A;  . POLYPECTOMY  10/16/2011   Procedure: POLYPECTOMY;  Surgeon: [name], MD;  Location: [facility];  Service: Endoscopy;;  Polypoid Lesion, Transverse and Sigmoid Colon     No Known Allergies    Family History  Problem Relation Age of Onset  . Emphysema Mother   . Heart attack Mother   . Hypertension Mother   . Diabetes Mother   .  Heart disease Mother   . COPD Mother   . Emphysema Father   . COPD Father   . Stroke Sister   . Asthma Sister   . Colon cancer Neg Hx   . Anesthesia problems Neg Hx   . Hypotension Neg Hx   . Malignant hyperthermia Neg Hx   . Pseudochol deficiency Neg Hx      Social History Mr. Alemu reports that he quit smoking about 14 months ago. His smoking use included Cigars. He has a 7.50 pack-year smoking history. He has never used smokeless tobacco. Mr. Keetch reports that he drinks alcohol.   Review of Systems CONSTITUTIONAL: No weight loss, fever, chills, weakness or fatigue.  HEENT: Eyes: No  visual loss, blurred vision, double vision or yellow sclerae.No hearing loss, sneezing, congestion, runny nose or sore throat.  SKIN: No rash or itching.  CARDIOVASCULAR: per hpi RESPIRATORY: No shortness of breath, cough or sputum.  GASTROINTESTINAL: No anorexia, nausea, vomiting or diarrhea. No abdominal pain or blood.  GENITOURINARY: No burning on urination, no polyuria NEUROLOGICAL: No headache, dizziness, syncope, paralysis, ataxia, numbness or tingling in the extremities. No change in bowel or bladder control.  MUSCULOSKELETAL: No muscle, back pain, joint pain or stiffness.  LYMPHATICS: No enlarged nodes. No history of splenectomy.  PSYCHIATRIC: No history of depression or anxiety.  ENDOCRINOLOGIC: No reports of sweating, cold or heat intolerance. No polyuria or polydipsia.  [name]   Physical Examination Vitals:   08/12/17 1615  BP: 122/80  Pulse: 84  SpO2: 98%   Vitals:   08/12/17 1615  Weight: 209 lb (94.8 kg)  Height: 5' 9.5" (1.765 m)     Gen: resting comfortably, no acute distress HEENT: no scleral icterus, pupils equal round and reactive, no palptable cervical adenopathy,  CV: RRR, no m/r/g, no jvd Resp: Clear to auscultation bilaterally GI: abdomen is soft, non-tender, non-distended, normal bowel sounds, no hepatosplenomegaly MSK: extremities are warm, no edema.    Skin: warm, no rash Neuro:  no focal deficits Psych: appropriate affect   Diagnostic Studies Cath 05/2012 [facility] FINDINGS  Hemodynamics:  AO 119/78  LV 121/71  Coronary angiography:  Coronary dominance: right  Left mainstem: The left main is patent with diffuse nonobstructive plaque. The distal left main has 30% stenosis.  Left anterior descending (LAD): the LAD is patent to the LV apex. The mid LAD has 50-60% stenosis at the origin of the second diagonal Annalysse Shoemaker. The first diagonal is very large in caliber and has no significant obstructive disease.  Left circumflex (LCx): the left circumflex is totally occluded in the mid vessel. There is TIMI 0 flow. Following PCI, there were 2 obtuse marginals and a left posterolateral Sierah Lacewell visualized, all of which are patent.  Right coronary artery (RCA): there is a high anterior origin of the RCA. The mid vessel has diffuse plaque estimated at 50-60%. The vessel is dominant. There is diffuse nonobstructive disease throughout. There is a small PDA and small posterolateral Jatoya Armbrister.  Left ventriculography: Left ventricular systolic function is in the low-normal range, LVEF is estimated at 55%, there is hypokinesis of the basal and midinferior wall.  PCI Note: Following the diagnostic procedure, the decision was made to proceed with PCI. The patient was loaded with Effient 60 mg. Weight-based bivalirudin was given for anticoagulation. Once a therapeutic ACT was achieved, a 6 [place] XB-LAD guide catheter was inserted. A Cougar coronary guidewire was used to cross the lesion. The lesion was predilated with a 2.5x15 mm balloon. The lesion was then stented with a 3.5x15 mm Resolute drug-eluting stent. Following PCI, there was 0% residual stenosis and TIMI-3 flow. Final angiography confirmed an excellent result. The patient tolerated the procedure well. There were no immediate procedural complications. A TR band was used for radial hemostasis. The  patient was transferred to the post catheterization recovery area for further monitoring.  PCI Data:  Vessel - circumflex /Segment - mid  Percent Stenosis (pre) 100  TIMI-flow 0  Stent 3.5 x 15 mm resolute integrity DES  Percent Stenosis (post) 0  TIMI-flow (post) 3  Final Conclusions:  1. Total occlusion of the left circumflex with successful primary PCI using a drug-eluting stent platform  2. Diffuse nonobstructive disease of the right  coronary artery and LAD  3. Mild left ventricular dysfunction consistent with inferior MI  Recommendations:  transfer to ICU. Post MI medical therapy will be instituted. Tobacco cessation counseling will be done. Anticipate 48 hour hospitalization if his post MI course is uncomplicated.  09/2016 echo Study Conclusions  - Left ventricle: The cavity size was mildly dilated. Wall thickness was increased in a pattern of mild LVH. Systolic function was moderately to severely reduced. The estimated ejection fraction was in the range of 30% to 35%. There is akinesis of the inferolateral and inferior myocardium. Doppler parameters are consistent with restrictive physiology, indicative of decreased left ventricular diastolic compliance and/or increased left atrial pressure. - Aortic valve: Mildly calcified annulus. Trileaflet. There was trivial regurgitation. - Mitral valve: Mildly thickened leaflets . There was moderate regurgitation directed posteriorly. - Left atrium: The atrium was mildly dilated. - Right atrium: Central venous pressure (est): 3 mm Hg. - Tricuspid valve: There was mild regurgitation. - Pulmonary arteries: PA peak pressure: 36 mm Hg (S). - Pericardium, extracardiac: There was no pericardial effusion.  Impressions:  - Mild LVH with mild LV chamber dilatation and LVEF 30-35%. Inferior/inferolateral akinesis. Restrictive diastolic filling pattern noted. Mild left atrial enlargement. Mildly  thickened mitral leaflets with moderate, posteriorly directed mitral regurgitation. Mildly calcified aortic annulus with trivial aortic regurgitation. Mild tricuspid regurgitation with PASP 36 mmHg.  05/2016 cath  Patent stent in the mid circumflex.  Moderate disease in the RCA and LAD.  Mild pulmonary hypertension.  Cardiac output 5.1 L/m. Cardiac index 2.4  Pulmonary artery saturation 66%.  Cardiomyopathy not related to obstructive coronary artery disease. The patient did state that he drinks alcohol heavily. I did explain him the cutting back on his alcohol may help his cardiac function. Follow-up with Dr. Harl Bowie.   06/2017 PFTs Mild to moderate ventilatory defect with small airway obstruction, reduced TLC, normal DLCO   Assessment and Plan  1. CAD - no recent symptoms -he will  continue current therapy  2. Chronic systolic HF - we will repeat echo  3. . Hyperlipidemia - continue staitn  4. HTN - bp is at goal, conitnue current meds      Arnoldo Lenis, M.D

## 2017-08-12 NOTE — Patient Instructions (Signed)
Medication Instructions:  Your physician recommends that you continue on your current medications as directed. Please refer to the Current Medication list given to you today.  Labwork: none  Testing/Procedures: none  Follow-Up: Your physician recommends that you schedule a follow-up appointment in: 2 months  Any Other Special Instructions Will Be Listed Below (If Applicable).  If you need a refill on your cardiac medications before your next appointment, please call your pharmacy. 

## 2017-08-19 ENCOUNTER — Ambulatory Visit (INDEPENDENT_AMBULATORY_CARE_PROVIDER_SITE_OTHER): Payer: BLUE CROSS/BLUE SHIELD

## 2017-08-19 ENCOUNTER — Other Ambulatory Visit: Payer: Self-pay

## 2017-08-19 DIAGNOSIS — I5020 Unspecified systolic (congestive) heart failure: Secondary | ICD-10-CM

## 2017-08-25 ENCOUNTER — Telehealth: Payer: Self-pay

## 2017-08-25 NOTE — Telephone Encounter (Signed)
-----   Message from Arnoldo Lenis, MD sent at 08/25/2017 12:40 PM EDT ----- Echo shows heart function remains weak, we will continue current meds   Zandra Abts MD

## 2017-08-25 NOTE — Telephone Encounter (Signed)
Called pt., no answer. Left message for pt to return call.  

## 2017-08-27 ENCOUNTER — Telehealth: Payer: Self-pay | Admitting: *Deleted

## 2017-08-27 NOTE — Telephone Encounter (Signed)
Called patient with test results. No answer. Left message to call back.  

## 2017-08-27 NOTE — Telephone Encounter (Signed)
-----   Message from Arnoldo Lenis, MD sent at 08/25/2017 12:40 PM EDT ----- Echo shows heart function remains weak, we will continue current meds   Zandra Abts MD

## 2017-08-30 ENCOUNTER — Telehealth: Payer: Self-pay

## 2017-08-30 NOTE — Telephone Encounter (Signed)
Called pt., left message for pt. To return call.

## 2017-08-30 NOTE — Telephone Encounter (Signed)
-----   Message from Arnoldo Lenis, MD sent at 08/25/2017 12:40 PM EDT ----- Echo shows heart function remains weak, we will continue current meds   Zandra Abts MD

## 2017-08-30 NOTE — Telephone Encounter (Signed)
Patient notified and verbalized understanding. 

## 2017-09-06 ENCOUNTER — Encounter (HOSPITAL_COMMUNITY): Payer: Self-pay | Admitting: Unknown Physician Specialty

## 2017-09-06 ENCOUNTER — Emergency Department (HOSPITAL_COMMUNITY): Payer: BLUE CROSS/BLUE SHIELD

## 2017-09-06 ENCOUNTER — Inpatient Hospital Stay (HOSPITAL_COMMUNITY)
Admission: EM | Admit: 2017-09-06 | Discharge: 2017-09-09 | DRG: 291 | Disposition: A | Discharge status: Home / Self Care | Payer: BLUE CROSS/BLUE SHIELD | Attending: Internal Medicine | Admitting: Internal Medicine

## 2017-09-06 DIAGNOSIS — Z794 Long term (current) use of insulin: Secondary | ICD-10-CM | POA: Diagnosis not present

## 2017-09-06 DIAGNOSIS — I130 Hypertensive heart and chronic kidney disease with heart failure and stage 1 through stage 4 chronic kidney disease, or unspecified chronic kidney disease: Principal | ICD-10-CM | POA: Diagnosis present

## 2017-09-06 DIAGNOSIS — Z8249 Family history of ischemic heart disease and other diseases of the circulatory system: Secondary | ICD-10-CM

## 2017-09-06 DIAGNOSIS — J9601 Acute respiratory failure with hypoxia: Secondary | ICD-10-CM

## 2017-09-06 DIAGNOSIS — Z825 Family history of asthma and other chronic lower respiratory diseases: Secondary | ICD-10-CM

## 2017-09-06 DIAGNOSIS — I5043 Acute on chronic combined systolic (congestive) and diastolic (congestive) heart failure: Secondary | ICD-10-CM | POA: Diagnosis present

## 2017-09-06 DIAGNOSIS — J441 Chronic obstructive pulmonary disease with (acute) exacerbation: Secondary | ICD-10-CM | POA: Diagnosis present

## 2017-09-06 DIAGNOSIS — E663 Overweight: Secondary | ICD-10-CM | POA: Diagnosis present

## 2017-09-06 DIAGNOSIS — Z87891 Personal history of nicotine dependence: Secondary | ICD-10-CM | POA: Diagnosis not present

## 2017-09-06 DIAGNOSIS — E1065 Type 1 diabetes mellitus with hyperglycemia: Secondary | ICD-10-CM | POA: Diagnosis not present

## 2017-09-06 DIAGNOSIS — Z6829 Body mass index (BMI) 29.0-29.9, adult: Secondary | ICD-10-CM

## 2017-09-06 DIAGNOSIS — E1029 Type 1 diabetes mellitus with other diabetic kidney complication: Secondary | ICD-10-CM | POA: Diagnosis not present

## 2017-09-06 DIAGNOSIS — I252 Old myocardial infarction: Secondary | ICD-10-CM

## 2017-09-06 DIAGNOSIS — Z7982 Long term (current) use of aspirin: Secondary | ICD-10-CM | POA: Diagnosis not present

## 2017-09-06 DIAGNOSIS — E782 Mixed hyperlipidemia: Secondary | ICD-10-CM | POA: Diagnosis present

## 2017-09-06 DIAGNOSIS — R739 Hyperglycemia, unspecified: Secondary | ICD-10-CM

## 2017-09-06 DIAGNOSIS — J810 Acute pulmonary edema: Secondary | ICD-10-CM | POA: Diagnosis not present

## 2017-09-06 DIAGNOSIS — I10 Essential (primary) hypertension: Secondary | ICD-10-CM | POA: Diagnosis not present

## 2017-09-06 DIAGNOSIS — N179 Acute kidney failure, unspecified: Secondary | ICD-10-CM | POA: Diagnosis present

## 2017-09-06 DIAGNOSIS — E1165 Type 2 diabetes mellitus with hyperglycemia: Secondary | ICD-10-CM | POA: Diagnosis present

## 2017-09-06 DIAGNOSIS — E1022 Type 1 diabetes mellitus with diabetic chronic kidney disease: Secondary | ICD-10-CM | POA: Diagnosis not present

## 2017-09-06 DIAGNOSIS — I2510 Atherosclerotic heart disease of native coronary artery without angina pectoris: Secondary | ICD-10-CM | POA: Diagnosis present

## 2017-09-06 DIAGNOSIS — I428 Other cardiomyopathies: Secondary | ICD-10-CM | POA: Diagnosis present

## 2017-09-06 DIAGNOSIS — Z955 Presence of coronary angioplasty implant and graft: Secondary | ICD-10-CM

## 2017-09-06 DIAGNOSIS — Z833 Family history of diabetes mellitus: Secondary | ICD-10-CM

## 2017-09-06 DIAGNOSIS — E1122 Type 2 diabetes mellitus with diabetic chronic kidney disease: Secondary | ICD-10-CM | POA: Diagnosis present

## 2017-09-06 DIAGNOSIS — I5023 Acute on chronic systolic (congestive) heart failure: Secondary | ICD-10-CM | POA: Diagnosis not present

## 2017-09-06 DIAGNOSIS — N181 Chronic kidney disease, stage 1: Secondary | ICD-10-CM | POA: Diagnosis present

## 2017-09-06 DIAGNOSIS — I509 Heart failure, unspecified: Secondary | ICD-10-CM

## 2017-09-06 DIAGNOSIS — I340 Nonrheumatic mitral (valve) insufficiency: Secondary | ICD-10-CM | POA: Diagnosis present

## 2017-09-06 DIAGNOSIS — R0902 Hypoxemia: Secondary | ICD-10-CM

## 2017-09-06 DIAGNOSIS — N183 Chronic kidney disease, stage 3 (moderate): Secondary | ICD-10-CM | POA: Diagnosis not present

## 2017-09-06 DIAGNOSIS — J811 Chronic pulmonary edema: Secondary | ICD-10-CM | POA: Diagnosis present

## 2017-09-06 LAB — URINALYSIS, ROUTINE W REFLEX MICROSCOPIC
BACTERIA UA: NONE SEEN
BILIRUBIN URINE: NEGATIVE
Glucose, UA: >=500 mg/dL — AB
HGB URINE DIPSTICK: NEGATIVE
Ketones, ur: NEGATIVE mg/dL
Leukocytes, UA: NEGATIVE
Nitrite: NEGATIVE
PH: 5 (ref 5.0–8.0)
Protein, ur: NEGATIVE mg/dL
RBC / HPF: NONE SEEN RBC/hpf (ref 0–5)
SPECIFIC GRAVITY, URINE: 1.006 (ref 1.005–1.030)
Squamous Epithelial / LPF: NONE SEEN

## 2017-09-06 LAB — CBC WITH DIFFERENTIAL/PLATELET
Basophils Absolute: 0.1 10*3/uL (ref 0.0–0.1)
Basophils Relative: 1 %
EOS ABS: 1.1 10*3/uL — AB (ref 0.0–0.7)
EOS PCT: 12 %
HCT: 44.1 % (ref 39.0–52.0)
Hemoglobin: 15.1 g/dL (ref 13.0–17.0)
LYMPHS ABS: 3.3 10*3/uL (ref 0.7–4.0)
Lymphocytes Relative: 36 %
MCH: 29.3 pg (ref 26.0–34.0)
MCHC: 34.2 g/dL (ref 30.0–36.0)
MCV: 85.6 fL (ref 78.0–100.0)
MONOS PCT: 5 %
Monocytes Absolute: 0.5 10*3/uL (ref 0.1–1.0)
Neutro Abs: 4.2 10*3/uL (ref 1.7–7.7)
Neutrophils Relative %: 46 %
PLATELETS: 251 10*3/uL (ref 150–400)
RBC: 5.15 MIL/uL (ref 4.22–5.81)
RDW: 12.9 % (ref 11.5–15.5)
WBC: 9.1 10*3/uL (ref 4.0–10.5)

## 2017-09-06 LAB — COMPREHENSIVE METABOLIC PANEL
ALBUMIN: 3.9 g/dL (ref 3.5–5.0)
ALT: 32 U/L (ref 17–63)
ANION GAP: 11 (ref 5–15)
AST: 33 U/L (ref 15–41)
Alkaline Phosphatase: 95 U/L (ref 38–126)
BILIRUBIN TOTAL: 0.7 mg/dL (ref 0.3–1.2)
BUN: 23 mg/dL — AB (ref 6–20)
CALCIUM: 8.9 mg/dL (ref 8.9–10.3)
CO2: 23 mmol/L (ref 22–32)
CREATININE: 1.36 mg/dL — AB (ref 0.61–1.24)
Chloride: 99 mmol/L — ABNORMAL LOW (ref 101–111)
GFR calc Af Amer: >60 mL/min (ref >60)
GFR calc non Af Amer: 56 mL/min — ABNORMAL LOW (ref >60)
GLUCOSE: 528 mg/dL — AB (ref 65–99)
Potassium: 3.5 mmol/L (ref 3.5–5.1)
Sodium: 133 mmol/L — ABNORMAL LOW (ref 135–145)
TOTAL PROTEIN: 8.2 g/dL — AB (ref 6.5–8.1)

## 2017-09-06 LAB — BLOOD GAS, VENOUS
ACID-BASE EXCESS: 0.3 mmol/L (ref 0.0–2.0)
Bicarbonate: 22.7 mmol/L (ref 20.0–28.0)
DRAWN BY: 15171
FIO2: 60
Mode: POSITIVE
O2 SAT: 64.7 %
RATE: 8 resp/min
pCO2, Ven: 55.3 mmHg (ref 44.0–60.0)
pH, Ven: 7.295 (ref 7.250–7.430)
pO2, Ven: 41.9 mmHg (ref 32.0–45.0)

## 2017-09-06 LAB — GLUCOSE, CAPILLARY
GLUCOSE-CAPILLARY: 384 mg/dL — AB (ref 65–99)
GLUCOSE-CAPILLARY: 292 mg/dL — AB (ref 65–99)
GLUCOSE-CAPILLARY: 378 mg/dL — AB (ref 65–99)
Glucose-Capillary: 314 mg/dL — ABNORMAL HIGH (ref 65–99)
Glucose-Capillary: 278 mg/dL — ABNORMAL HIGH (ref 65–99)
Glucose-Capillary: 251 mg/dL — ABNORMAL HIGH (ref 65–99)
Glucose-Capillary: 229 mg/dL — ABNORMAL HIGH (ref 65–99)
Glucose-Capillary: 201 mg/dL — ABNORMAL HIGH (ref 65–99)
Glucose-Capillary: 298 mg/dL — ABNORMAL HIGH (ref 65–99)

## 2017-09-06 LAB — TROPONIN I
TROPONIN I: 0.25 ng/mL — AB (ref <0.03)
Troponin I: 0.03 ng/mL (ref <0.03)
Troponin I: 0.28 ng/mL (ref <0.03)
Troponin I: 0.22 ng/mL (ref <0.03)

## 2017-09-06 LAB — CBG MONITORING, ED
GLUCOSE-CAPILLARY: 456 mg/dL — AB (ref 65–99)
GLUCOSE-CAPILLARY: 448 mg/dL — AB (ref 65–99)

## 2017-09-06 LAB — MRSA PCR SCREENING: MRSA BY PCR: NEGATIVE

## 2017-09-06 LAB — BRAIN NATRIURETIC PEPTIDE: B Natriuretic Peptide: 376 pg/mL — ABNORMAL HIGH (ref 0.0–100.0)

## 2017-09-06 MED ORDER — SODIUM CHLORIDE 0.9 % IV SOLN: 250.0000 mL | INTRAVENOUS | Status: DC | PRN

## 2017-09-06 MED ORDER — INSULIN GLARGINE 100 UNIT/ML ~~LOC~~ SOLN
60.0000 [IU] | Freq: Once | SUBCUTANEOUS | Status: AC
  Administered 2017-09-06: 60 [IU] via SUBCUTANEOUS

## 2017-09-06 MED ORDER — INSULIN REGULAR BOLUS VIA INFUSION
0.0000 [IU] | Freq: Three times a day (TID) | INTRAVENOUS | Status: DC
  Filled 2017-09-06: qty 10

## 2017-09-06 MED ORDER — METOPROLOL SUCCINATE ER 50 MG PO TB24
50.0000 mg | ORAL_TABLET | Freq: Every day | ORAL | Status: DC
  Administered 2017-09-06 – 2017-09-09 (×4): 50 mg via ORAL
  Filled 2017-09-06 (×4): qty 1

## 2017-09-06 MED ORDER — ALBUTEROL (5 MG/ML) CONTINUOUS INHALATION SOLN
10.0000 mg/h | INHALATION_SOLUTION | RESPIRATORY_TRACT | Status: DC
  Administered 2017-09-06: 10 mg/h via RESPIRATORY_TRACT

## 2017-09-06 MED ORDER — NITROGLYCERIN 2 % TD OINT
1.0000 [in_us] | TOPICAL_OINTMENT | Freq: Once | TRANSDERMAL | Status: AC
  Administered 2017-09-06: 1 [in_us] via TOPICAL

## 2017-09-06 MED ORDER — ENOXAPARIN SODIUM 40 MG/0.4ML ~~LOC~~ SOLN
40.0000 mg | SUBCUTANEOUS | Status: DC
  Administered 2017-09-06 – 2017-09-09 (×4): 40 mg via SUBCUTANEOUS
  Filled 2017-09-06 (×4): qty 0.4

## 2017-09-06 MED ORDER — TEMAZEPAM 7.5 MG PO CAPS: 7.5000 mg | ORAL_CAPSULE | Freq: Every evening | ORAL | Status: DC | PRN

## 2017-09-06 MED ORDER — NITROGLYCERIN 2 % TD OINT
0.5000 [in_us] | TOPICAL_OINTMENT | Freq: Four times a day (QID) | TRANSDERMAL | Status: DC
  Administered 2017-09-06 – 2017-09-09 (×12): 0.5 [in_us] via TOPICAL
  Filled 2017-09-06 (×11): qty 1

## 2017-09-06 MED ORDER — SODIUM CHLORIDE 0.9% FLUSH
3.0000 mL | Freq: Two times a day (BID) | INTRAVENOUS | Status: DC
  Administered 2017-09-06: 3 mL via INTRAVENOUS

## 2017-09-06 MED ORDER — POTASSIUM CHLORIDE CRYS ER 20 MEQ PO TBCR
20.0000 meq | EXTENDED_RELEASE_TABLET | Freq: Every day | ORAL | Status: DC
  Administered 2017-09-06 – 2017-09-09 (×4): 20 meq via ORAL
  Filled 2017-09-06 (×4): qty 1

## 2017-09-06 MED ORDER — SODIUM CHLORIDE 0.9% FLUSH: 3.0000 mL | INTRAVENOUS | Status: DC | PRN

## 2017-09-06 MED ORDER — SODIUM CHLORIDE 0.9 % IV SOLN
INTRAVENOUS | Status: DC
  Administered 2017-09-06: 4 [IU]/h via INTRAVENOUS
  Filled 2017-09-06 (×2): qty 1

## 2017-09-06 MED ORDER — ALBUTEROL (5 MG/ML) CONTINUOUS INHALATION SOLN
INHALATION_SOLUTION | RESPIRATORY_TRACT | Status: AC
  Filled 2017-09-06: qty 20

## 2017-09-06 MED ORDER — DEXTROSE 50 % IV SOLN: 25.0000 mL | INTRAVENOUS | Status: DC | PRN

## 2017-09-06 MED ORDER — SODIUM CHLORIDE 0.9 % IV SOLN
INTRAVENOUS | Status: DC
  Administered 2017-09-06: 09:00:00 via INTRAVENOUS

## 2017-09-06 MED ORDER — IPRATROPIUM-ALBUTEROL 0.5-2.5 (3) MG/3ML IN SOLN
3.0000 mL | Freq: Four times a day (QID) | RESPIRATORY_TRACT | Status: DC
  Administered 2017-09-06 – 2017-09-07 (×7): 3 mL via RESPIRATORY_TRACT
  Filled 2017-09-06 (×7): qty 3

## 2017-09-06 MED ORDER — ATORVASTATIN CALCIUM 40 MG PO TABS
40.0000 mg | ORAL_TABLET | Freq: Every day | ORAL | Status: DC
  Administered 2017-09-06 – 2017-09-09 (×4): 40 mg via ORAL
  Filled 2017-09-06 (×4): qty 1

## 2017-09-06 MED ORDER — AMLODIPINE BESYLATE 5 MG PO TABS
10.0000 mg | ORAL_TABLET | Freq: Every day | ORAL | Status: DC
  Administered 2017-09-06 – 2017-09-09 (×4): 10 mg via ORAL
  Filled 2017-09-06 (×4): qty 2

## 2017-09-06 MED ORDER — INSULIN GLARGINE 100 UNIT/ML ~~LOC~~ SOLN
60.0000 [IU] | Freq: Every day | SUBCUTANEOUS | Status: DC
  Administered 2017-09-07 – 2017-09-08 (×2): 60 [IU] via SUBCUTANEOUS
  Filled 2017-09-06 (×3): qty 0.6

## 2017-09-06 MED ORDER — METHOCARBAMOL 500 MG PO TABS
500.0000 mg | ORAL_TABLET | Freq: Three times a day (TID) | ORAL | Status: DC
  Administered 2017-09-06 – 2017-09-09 (×10): 500 mg via ORAL
  Filled 2017-09-06 (×10): qty 1

## 2017-09-06 MED ORDER — NITROGLYCERIN 2 % TD OINT
TOPICAL_OINTMENT | TRANSDERMAL | Status: AC
  Administered 2017-09-06: 1 [in_us] via TOPICAL
  Filled 2017-09-06: qty 1

## 2017-09-06 MED ORDER — SODIUM CHLORIDE 0.9 % IV SOLN
INTRAVENOUS | Status: DC
  Administered 2017-09-06: 2.5 [IU]/h via INTRAVENOUS
  Filled 2017-09-06: qty 1

## 2017-09-06 MED ORDER — ASPIRIN EC 81 MG PO TBEC
81.0000 mg | DELAYED_RELEASE_TABLET | Freq: Every day | ORAL | Status: DC
  Administered 2017-09-06 – 2017-09-09 (×4): 81 mg via ORAL
  Filled 2017-09-06 (×4): qty 1

## 2017-09-06 MED ORDER — INSULIN ASPART 100 UNIT/ML ~~LOC~~ SOLN
0.0000 [IU] | Freq: Every day | SUBCUTANEOUS | Status: DC
  Administered 2017-09-06: 5 [IU] via SUBCUTANEOUS

## 2017-09-06 MED ORDER — SPIRONOLACTONE 25 MG PO TABS
25.0000 mg | ORAL_TABLET | Freq: Every day | ORAL | Status: DC
Start: 2017-09-06 — End: 2017-09-09
  Administered 2017-09-06 – 2017-09-09 (×4): 25 mg via ORAL
  Filled 2017-09-06 (×4): qty 1

## 2017-09-06 MED ORDER — METHYLPREDNISOLONE SODIUM SUCC 40 MG IJ SOLR
40.0000 mg | Freq: Four times a day (QID) | INTRAMUSCULAR | Status: DC
  Administered 2017-09-06 – 2017-09-07 (×4): 40 mg via INTRAVENOUS
  Filled 2017-09-06 (×4): qty 1

## 2017-09-06 MED ORDER — DOCUSATE SODIUM 100 MG PO CAPS
100.0000 mg | ORAL_CAPSULE | Freq: Two times a day (BID) | ORAL | Status: DC
  Administered 2017-09-06 – 2017-09-09 (×7): 100 mg via ORAL
  Filled 2017-09-06 (×7): qty 1

## 2017-09-06 MED ORDER — FUROSEMIDE 10 MG/ML IJ SOLN
80.0000 mg | Freq: Once | INTRAMUSCULAR | Status: AC
  Administered 2017-09-06: 80 mg via INTRAVENOUS
  Filled 2017-09-06: qty 8

## 2017-09-06 MED ORDER — INSULIN ASPART 100 UNIT/ML ~~LOC~~ SOLN
0.0000 [IU] | Freq: Three times a day (TID) | SUBCUTANEOUS | Status: DC
Start: 2017-09-06 — End: 2017-09-09
  Administered 2017-09-06: 11 [IU] via SUBCUTANEOUS
  Administered 2017-09-07: 20 [IU] via SUBCUTANEOUS
  Administered 2017-09-07: 3 [IU] via SUBCUTANEOUS
  Administered 2017-09-07: 15 [IU] via SUBCUTANEOUS
  Administered 2017-09-08 (×2): 3 [IU] via SUBCUTANEOUS
  Administered 2017-09-08: 0 [IU] via SUBCUTANEOUS

## 2017-09-06 MED ORDER — INSULIN GLARGINE 100 UNIT/ML ~~LOC~~ SOLN
60.0000 [IU] | Freq: Every day | SUBCUTANEOUS | Status: DC
  Filled 2017-09-06 (×2): qty 0.6

## 2017-09-06 MED ORDER — INSULIN ASPART 100 UNIT/ML ~~LOC~~ SOLN
15.0000 [IU] | Freq: Three times a day (TID) | SUBCUTANEOUS | Status: DC
  Administered 2017-09-06 – 2017-09-07 (×3): 15 [IU] via SUBCUTANEOUS

## 2017-09-06 MED ORDER — LOSARTAN POTASSIUM 25 MG PO TABS
12.5000 mg | ORAL_TABLET | Freq: Every day | ORAL | Status: DC
Start: 2017-09-06 — End: 2017-09-09
  Administered 2017-09-06 – 2017-09-09 (×4): 12.5 mg via ORAL
  Filled 2017-09-06: qty 1
  Filled 2017-09-06: qty 0.5
  Filled 2017-09-06: qty 1
  Filled 2017-09-06 (×2): qty 0.5

## 2017-09-06 MED ORDER — FUROSEMIDE 10 MG/ML IJ SOLN
40.0000 mg | Freq: Two times a day (BID) | INTRAMUSCULAR | Status: DC
  Administered 2017-09-06 – 2017-09-09 (×6): 40 mg via INTRAVENOUS
  Filled 2017-09-06 (×6): qty 4

## 2017-09-06 MED ORDER — IPRATROPIUM BROMIDE 0.02 % IN SOLN
RESPIRATORY_TRACT | Status: AC
Start: 2017-09-06 — End: 2017-09-06
  Administered 2017-09-06: 0.5 mg
  Filled 2017-09-06: qty 2.5

## 2017-09-06 NOTE — Progress Notes (Signed)
Patient seen and examined, database reviewed. Admitted this morning due to shortness of breath. Is known to have ischemic cardiomyopathy with systolic CHF and known ejection fraction of 25-30%. On chest x-ray was found to have pulmonary edema consistent with acute CHF. Continue IV Lasix, strive for negative fluid balance, does also have mild troponin elevation which is flat, does not have anginal symptoms, do believe this is simply related to acute CHF and no further cardiac workup is planned for at present. Also was found to have very uncontrolled diabetes with CBGs greater than 500. Was placed on glucose stabilizer and has now been transitioned over to subcutaneous insulin. We'll continue to follow in ICU today.  Domingo Mend, MD [facility] Pager: [phone]

## 2017-09-06 NOTE — ED Provider Notes (Signed)
[facility] DEPT Provider Note   CSN: [id number] Arrival date & time: 09/06/17  0326  Pt seen on arrival about 3:26 AM   History   Chief Complaint Chief Complaint  Patient presents with  . Respiratory Distress   Level V caveat due to respiratory distress  HPI Antonio Cobb is a 56 y.o. male.  HPI  Per EMS patient was fine all day today. He went to bed and acutely awakened feeling short of breath. On their arrival patient had a heart rate 140 and hypertension. He was diaphoretic and rattling like he had too much fluid. They gave him 1 sublingual nitroglycerin 1. He denied any chest pain and he denies any pain now. Patient has a history of congestive heart failure, reactive airway disease and hypertension. He was started on BiPAP with his initial pulse ox on room air of 83%.  PCP [name], MD Cardiology Dr Harl Bowie  Past Medical History:  Diagnosis Date  . Alcohol abuse   . CAD (coronary artery disease)    DES to circumflex June 2013  . Cardiomyopathy (HCC)    LVEF 30-35%  . COPD (chronic obstructive pulmonary disease) ([place])   . Diabetes mellitus, type 2 ([place])   . Essential hypertension   . Mitral regurgitation    Moderate  . Mixed hyperlipidemia   . Myocardial infarction ([place]) 05/23/2012   Inferolateral STEMI  . Obesity     Patient Active Problem List   Diagnosis Date Noted  . Pulmonary edema 07/29/2017  . Lumbar degenerative disc disease 06/20/2017  . Encounter for examination following treatment at hospital 06/14/2017  . Prolonged QT interval 10/07/2016  . Coronary artery disease involving coronary bypass graft of native heart with angina pectoris ([place])   . Abnormal echocardiogram   . Allergic rhinitis 05/06/2015  . NICM (nonischemic cardiomyopathy) ([place]) 05/23/2012  . Tobacco abuse 05/23/2012  . Alcohol abuse 05/23/2012  . Arteriosclerotic cardiovascular disease (ASCVD) 05/23/2012  . ED (erectile dysfunction) 05/10/2012  . Overweight  08/12/2010  . Diabetes mellitus, insulin dependent (IDDM), uncontrolled ([place]) 03/08/2008  . Hyperlipidemia LDL goal <70 03/08/2008  . Essential hypertension 03/08/2008    Past Surgical History:  Procedure Laterality Date  . CARDIAC CATHETERIZATION  2 yrs ago  . CARDIAC CATHETERIZATION N/A 05/15/2016   Procedure: Right/Left Heart Cath and Coronary Angiography;  Surgeon: [name], MD;  Location: [facility];  Service: Cardiovascular;  Laterality: N/A;  . CORONARY STENT PLACEMENT  05/23/12  . LEFT HEART CATHETERIZATION WITH CORONARY ANGIOGRAM N/A 05/23/2012   Procedure: LEFT HEART CATHETERIZATION WITH CORONARY ANGIOGRAM;  Surgeon: [name], MD;  Location: [facility] CATH LAB;  Service: Cardiovascular;  Laterality: N/A;  . None    . PERCUTANEOUS CORONARY STENT INTERVENTION (PCI-S) N/A 05/23/2012   Procedure: PERCUTANEOUS CORONARY STENT INTERVENTION (PCI-S);  Surgeon: [name], MD;  Location: [facility] CATH LAB;  Service: Cardiovascular;  Laterality: N/A;  . POLYPECTOMY  10/16/2011   Procedure: POLYPECTOMY;  Surgeon: [name], MD;  Location: [facility];  Service: Endoscopy;;  Polypoid Lesion, Transverse and Sigmoid Colon       Home Medications    Prior to Admission medications   Medication Sig Start Date End Date Taking? Authorizing Provider  albuterol (PROVENTIL HFA;VENTOLIN HFA) 108 (90 Base) MCG/ACT inhaler Inhale 2 puffs into the lungs every 6 (six) hours as needed for wheezing or shortness of breath. 02/26/17   [name], MD  amLODipine (NORVASC) 10 MG tablet Take 1 tablet (10 mg total) by  mouth daily. 05/12/17   [name], MD  aspirin (ASPIRIN ADULT LOW STRENGTH) 81 MG EC tablet Take 81 mg by mouth daily.      [provider]  atorvastatin (LIPITOR) 40 MG tablet Take 1 tablet (40 mg total) by mouth daily. 05/12/17   [name], MD  budesonide-formoterol (SYMBICORT) 80-4.5 MCG/ACT inhaler Inhale 2 puffs into the lungs 2 (two) times daily.  02/26/17   [name], MD  docusate sodium (COLACE) 100 MG capsule Take 100 mg by mouth 2 (two) times daily.    [provider]  FREESTYLE LITE test strip TEST four times a day 03/15/17   [name], MD  furosemide (LASIX) 40 MG tablet Take 1 tablet (40 mg total) by mouth daily. 05/12/17   [name], MD  insulin aspart (NOVOLOG) 100 UNIT/ML injection Inject 15 Units into the skin 3 (three) times daily before meals. Sliding scale begins at 15 units and increases as CBG dictates 03/23/15   Pollina, Gwenyth Allegra, MD  Insulin Glargine (LANTUS SOLOSTAR) 100 UNIT/ML Solostar Pen Inject 60 Units into the skin daily at 10 pm. Everynight    [provider]  ipratropium-albuterol (DUONEB) 0.5-2.5 (3) MG/3ML SOLN Take 3 mLs by nebulization every 6 (six) hours as needed. Patient taking differently: Take 3 mLs by nebulization every 6 (six) hours as needed (for wheezing or shortness of breath).  04/27/16   [name], MD  Lancets (FREESTYLE) lancets Four times daily testing dx e11.65 03/12/16   [name], MD  losartan (COZAAR) 25 MG tablet Take 12.5 mg by mouth daily.    [provider]  methocarbamol (ROBAXIN) 500 MG tablet Take 1 tablet (500 mg total) by mouth 3 (three) times daily. 06/11/17   Lily Kocher, PA-C  metoprolol succinate (TOPROL-XL) 50 MG 24 hr tablet take 1 tablet once daily with food 05/12/17   [name], MD  Multiple Vitamin (MULTIVITAMIN) capsule Take 1 capsule by mouth daily.    [provider]  nitroGLYCERIN (NITROSTAT) 0.4 MG SL tablet Place 1 tablet (0.4 mg total) under the tongue every 5 (five) minutes as needed for chest pain. 05/29/16 11/06/19  Lendon Colonel, NP  potassium chloride SA (KLOR-CON M20) 20 MEQ tablet Take 1 tablet (20 mEq total) by mouth daily. 05/12/17   [name], MD  PRESCRIPTION MEDICATION Take 5 mLs by mouth daily as needed. Magic mouthwash with nystatin    [provider]  spironolactone (ALDACTONE) 25 MG tablet Take 1 tablet (25 mg total) by mouth daily. 05/12/17   [name], MD  temazepam (RESTORIL) 7.5 MG capsule take 1 capsule by mouth at bedtime if needed 08/09/17   [name], MD  tiotropium [facility] HANDIHALER) 18 MCG inhalation capsule inhale the contents of one capsule in the handihaler once daily 05/12/17   [name], MD    Family History Family History  Problem Relation Age of Onset  . Emphysema Mother   . Heart attack Mother   . Hypertension Mother   . Diabetes Mother   . Heart disease Mother   . COPD Mother   . Emphysema Father   . COPD Father   . Stroke Sister   . Asthma Sister   . Colon cancer Neg Hx   . Anesthesia problems Neg Hx   . Hypotension Neg Hx   . Malignant hyperthermia Neg Hx   . Pseudochol deficiency Neg Hx     Social  History Social History  Substance Use Topics  . Smoking status: Former Smoker    Packs/day: 0.25    Years: 30.00    Types: Cigars    Quit date: 05/23/2016  . Smokeless tobacco: Never Used     Comment: since early 37s  . Alcohol use 0.0 oz/week     Comment: occ  employed   Allergies   Patient has no known allergies.   Review of Systems Review of Systems  Unable to perform ROS: Severe respiratory distress  Cardiovascular: Negative for chest pain and leg swelling.     Physical Exam Updated Vital Signs ED Triage Vitals  Enc Vitals Group     BP 09/06/17 0330 (!) 158/106     Pulse Rate 09/06/17 0330 (!) 129     Resp 09/06/17 0330 (!) 32     Temp 09/06/17 0351 98 F (36.7 C)     Temp Source 09/06/17 0351 Temporal     SpO2 09/06/17 0330 97 %     Weight --      Height --      Head Circumference --      Peak Flow --      Pain Score --      Pain Loc --      Pain Edu? --      Excl. in [place]? --     Vital signs normal hypertension, tachycardia, tachypnea  Physical Exam  Constitutional: He is oriented to person, place, and time. He appears  well-developed and well-nourished.  Non-toxic appearance. He does not appear ill. He appears distressed.  HENT:  Head: Normocephalic and atraumatic.  Right Ear: External ear normal.  Left Ear: External ear normal.  Nose: Nose normal. No mucosal edema or rhinorrhea.  Mouth/Throat: Mucous membranes are normal. No dental abscesses or uvula swelling.  Eyes: Pupils are equal, round, and reactive to light. Conjunctivae and EOM are normal.  Neck: Normal range of motion and full passive range of motion without pain. Neck supple.  Cardiovascular: Regular rhythm and normal heart sounds.  Tachycardia present.  Exam reveals no gallop and no friction rub.   No murmur heard. Pulmonary/Chest: Accessory muscle usage present. Tachypnea noted. He is in respiratory distress. He has decreased breath sounds. He has no wheezes. He has no rhonchi. He has rales in the right upper field, the right middle field, the right lower field, the left upper field, the left middle field and the left lower field. He exhibits no tenderness and no crepitus.  His rales are audible when switched from EMS BiPap to ours  Abdominal: Soft. Normal appearance and bowel sounds are normal. He exhibits no distension. There is no tenderness. There is no rebound and no guarding.  Musculoskeletal: Normal range of motion. He exhibits no edema or tenderness.  Moves all extremities well.   Neurological: He is alert and oriented to person, place, and time. He has normal strength. No cranial nerve deficit.  Skin: Skin is warm and intact. No rash noted. He is diaphoretic. No erythema. No pallor.  diaphoretic on face  Psychiatric: He has a normal mood and affect. His speech is normal and behavior is normal. His mood appears not anxious.  Nursing note and vitals reviewed.    ED Treatments / Results  Labs (all labs ordered are listed, but only abnormal results are displayed) Results for orders placed or performed during the hospital encounter of  09/06/17  Comprehensive metabolic panel  Result Value Ref Range   Sodium 133 (L)  135 - 145 mmol/L   Potassium 3.5 3.5 - 5.1 mmol/L   Chloride 99 (L) 101 - 111 mmol/L   CO2 23 22 - 32 mmol/L   Glucose, Bld 528 (HH) 65 - 99 mg/dL   BUN 23 (H) 6 - 20 mg/dL   Creatinine, Ser 1.36 (H) 0.61 - 1.24 mg/dL   Calcium 8.9 8.9 - 10.3 mg/dL   Total Protein 8.2 (H) 6.5 - 8.1 g/dL   Albumin 3.9 3.5 - 5.0 g/dL   AST 33 15 - 41 U/L   ALT 32 17 - 63 U/L   Alkaline Phosphatase 95 38 - 126 U/L   Total Bilirubin 0.7 0.3 - 1.2 mg/dL   GFR calc non Af Amer 56 (L) >60 mL/min   GFR calc Af Amer >60 >60 mL/min   Anion gap 11 5 - 15  Brain natriuretic peptide  Result Value Ref Range   B Natriuretic Peptide 376.0 (H) 0.0 - 100.0 pg/mL  Troponin I  Result Value Ref Range   Troponin I 0.03 (HH) <0.03 ng/mL  CBC with Differential  Result Value Ref Range   WBC 9.1 4.0 - 10.5 K/uL   RBC 5.15 4.22 - 5.81 MIL/uL   Hemoglobin 15.1 13.0 - 17.0 g/dL   HCT 44.1 39.0 - 52.0 %   MCV 85.6 78.0 - 100.0 fL   MCH 29.3 26.0 - 34.0 pg   MCHC 34.2 30.0 - 36.0 g/dL   RDW 12.9 11.5 - 15.5 %   Platelets 251 150 - 400 K/uL   Neutrophils Relative % 46 %   Neutro Abs 4.2 1.7 - 7.7 K/uL   Lymphocytes Relative 36 %   Lymphs Abs 3.3 0.7 - 4.0 K/uL   Monocytes Relative 5 %   Monocytes Absolute 0.5 0.1 - 1.0 K/uL   Eosinophils Relative 12 %   Eosinophils Absolute 1.1 (H) 0.0 - 0.7 K/uL   Basophils Relative 1 %   Basophils Absolute 0.1 0.0 - 0.1 K/uL   Laboratory interpretation all normal except elevated glucose without acidosis, renal insufficiency, mildly positive troponin which is chronic, BNP in his usual range    EKG  EKG Interpretation  Date/Time:  Monday September 06 2017 03:30:11 EDT Ventricular Rate:  131 PR Interval:    QRS Duration: 80 QT Interval:  308 QTC Calculation: 453 R Axis:   21 Text Interpretation:  Sinus tachycardia Multiple ventricular premature complexes Borderline low voltage, extremity  leads Nonspecific T abnormalities, inferior leads Baseline wander Electrode noise Since last tracing rate faster 29 Jul 2017 Confirmed by Rolland Porter [id number]) on 09/06/2017 3:52:49 AM       Radiology Dg Chest Port 1 View  Result Date: 09/06/2017 CLINICAL DATA:  Shortness of breath. History of congestive heart failure. EXAM: PORTABLE CHEST 1 VIEW COMPARISON:  Chest radiograph 07/29/2017 FINDINGS: Unchanged cardiomegaly. There is moderate pulmonary edema. No pleural effusion or pneumothorax. IMPRESSION: Cardiomegaly and moderate pulmonary edema. Electronically Signed   By: Ulyses Jarred M.D.   On: 09/06/2017 04:00   Echocardiogram Sept 6, 2018 Study Conclusions  - Left ventricle: The cavity size was moderately dilated. Wall   thickness was increased in a pattern of mild LVH. Systolic   function was severely reduced. The estimated ejection fraction   was in the range of 25% to 30%. Diffuse hypokinesis. Features are   consistent with a pseudonormal left ventricular filling pattern,   with concomitant abnormal relaxation and increased filling   pressure (grade 2 diastolic dysfunction). Doppler parameters are  consistent with high ventricular filling pressure. - Regional wall motion abnormality: Akinesis of the mid inferior,   basal-mid inferolateral, and apical lateral myocardium; severe   hypokinesis of the basal inferior myocardium; moderate   hypokinesis of the mid anterolateral myocardium. - Aortic valve: There was trivial regurgitation. - Aorta: Mild aortic root dilatation. - Mitral valve: Calcified annulus. Mildly thickened leaflets .   There was moderate, posteriorly directed regurgitation. - Left atrium: The atrium was severely dilated. - Atrial septum: The septum bowed from left to right, consistent   with increased left atrial pressure. - Tricuspid valve: There was mild regurgitation. - Pulmonary arteries: PA peak pressure: 34 mm Hg (S).   Procedures .Critical Care Performed  by: Tomi Bamberger, Mckaela Howley Authorized by: Rolland Porter   Critical care provider statement:    Critical care time (minutes):  45   Critical care time was exclusive of:  Separately billable procedures and treating other patients   Critical care was necessary to treat or prevent imminent or life-threatening deterioration of the following conditions:  Cardiac failure and respiratory failure   Critical care was time spent personally by me on the following activities:  Ordering and review of laboratory studies, ordering and review of radiographic studies, pulse oximetry, re-evaluation of patient's condition, review of old charts, examination of patient, evaluation of patient's response to treatment and obtaining history from patient or surrogate     (including critical care time)  Medications Ordered in ED Medications  albuterol (PROVENTIL, VENTOLIN) (5 MG/ML) 0.5% continuous inhalation solution (  Not Given 09/06/17 0346)  albuterol (PROVENTIL,VENTOLIN) solution continuous neb (10 mg/hr Nebulization New Bag/Given 09/06/17 0347)  insulin regular (NOVOLIN R,HUMULIN R) 100 Units in sodium chloride 0.9 % 100 mL (1 Units/mL) infusion (not administered)  ipratropium (ATROVENT) 0.02 % nebulizer solution (0.5 mg  Given 09/06/17 0342)  furosemide (LASIX) injection 80 mg (80 mg Intravenous Given 09/06/17 0337)  nitroGLYCERIN (NITROGLYN) 2 % ointment 1 inch (1 inch Topical Given 09/06/17 0337)     Initial Impression / Assessment and Plan / ED Course  I have reviewed the triage vital signs and the nursing notes.  Pertinent labs & imaging results that were available during my care of the patient were reviewed by me and considered in my medical decision making (see chart for details).     Patient was switched over from EMS his BiPAP system to our BiPAP machine. He was started on a continuous nebulizer with albuterol 10 mg plus Atrovent. He was given Lasix 80 mg IV and he had nitroglycerin 1 inch placed to his  chest.  Recheck at 4 AM patient appears to be doing much better. He is much more alert and he is talking more. He remains on BiPAP. However when I listen to him he still has diffuse rales.He has not had urinary output yet.  Recheck  4:30 a.m. Patient continues to state he feels better. He is starting to have urinary output and has put out about 6-700 mL of urine. However when I listen to him he still has diffuse rales. He will need to remain on the bipap longer. He is able to talk now. He is agreeable to admission. We discussed his laboratory testing including the hyperglycemia. His wife states that always happens when he comes and with these episodes. He was started on an insulin drip. He was not given the traditional IV fluid boluses for his hyperglycemia due to his underlying congestive heart failure.  05:08 AM Dr Darrick Meigs will admit.  Final Clinical Impressions(s) / ED Diagnoses   Final diagnoses:  Acute on chronic congestive heart failure, unspecified heart failure type ([place])  Hypoxia  Acute respiratory failure with hypoxia ([place])  Hyperglycemia    Plan admission  Rolland Porter, MD, Barbette Or, MD 09/06/17 [phone]

## 2017-09-06 NOTE — ED Notes (Signed)
CRITICAL VALUE ALERT  Critical Value:Blood Glucose 528 Date & Time Notied:  9/247/18 @ 3335 Provider Notified:Dr. Tomi Bamberger Orders Received/Actions taken: None yet

## 2017-09-06 NOTE — Progress Notes (Addendum)
CRITICAL VALUE ALERT  Critical Value:  Troponin 0.28  Date & Time Notied:  09/06/17 1418  Provider Notified: Dr. Jerilee Hoh  Orders Received/Actions taken: no new orders

## 2017-09-06 NOTE — H&P (Signed)
TRH H&P    Patient Demographics:    Antonio Cobb, is a 56 y.o. male  MRN: [id number]  DOB - 11/26/1961  Admit Date - 09/06/2017  Referring MD/NP/PA:   Outpatient Primary MD for the patient is [name], MD  Patient coming from: Home  Chief Complaint  Patient presents with  . Respiratory Distress      HPI:    Antonio Cobb  is a 56 y.o. male, With history of mixed ischemic and nonischemic cardiac myopathy, EF 25-30% hypertension, diabetes mellitus, COPD who came to hospital with sudden shortness of breath which started last night. Patient says that he went to bathroom and after he had bowel movement he started feeling short of breath. He was coughing and diaphoretic. He denies chest pain. Patient was brought to the ED by EMS.  In the ED pulse ox on room air was 83%, patient was put on BiPAP. He was given 80 mg of Lasix IV 1.  He denies nausea, vomiting or diarrhea. Denies dizziness, no passing out. No fever. He was coughing up reddish colored phlegm at home. He denies dysuria. Complains of constipation   Review of systems:      All other systems reviewed and are negative.   With Past History of the following :    Past Medical History:  Diagnosis Date  . Alcohol abuse   . CAD (coronary artery disease)    DES to circumflex June 2013  . Cardiomyopathy (HCC)    LVEF 30-35%  . COPD (chronic obstructive pulmonary disease) ([place])   . Diabetes mellitus, type 2 ([place])   . Essential hypertension   . Mitral regurgitation    Moderate  . Mixed hyperlipidemia   . Myocardial infarction ([place]) 05/23/2012   Inferolateral STEMI  . Obesity       Past Surgical History:  Procedure Laterality Date  . CARDIAC CATHETERIZATION  2 yrs ago  . CARDIAC CATHETERIZATION N/A 05/15/2016   Procedure: Right/Left Heart Cath and Coronary Angiography;  Surgeon: [name], MD;  Location: [facility];  Service: Cardiovascular;  Laterality: N/A;  . CORONARY STENT PLACEMENT  05/23/12  . LEFT HEART CATHETERIZATION WITH CORONARY ANGIOGRAM N/A 05/23/2012   Procedure: LEFT HEART CATHETERIZATION WITH CORONARY ANGIOGRAM;  Surgeon: [name], MD;  Location: [facility] CATH LAB;  Service: Cardiovascular;  Laterality: N/A;  . None    . PERCUTANEOUS CORONARY STENT INTERVENTION (PCI-S) N/A 05/23/2012   Procedure: PERCUTANEOUS CORONARY STENT INTERVENTION (PCI-S);  Surgeon: [name], MD;  Location: [facility] CATH LAB;  Service: Cardiovascular;  Laterality: N/A;  . POLYPECTOMY  10/16/2011   Procedure: POLYPECTOMY;  Surgeon: [name], MD;  Location: [facility];  Service: Endoscopy;;  Polypoid Lesion, Transverse and Sigmoid Colon      Social History:      Social History  Substance Use Topics  . Smoking status: Former Smoker    Packs/day: 0.25    Years: 30.00    Types: Cigars    Quit date: 05/23/2016  . Smokeless tobacco: Never Used  Comment: since early 64s  . Alcohol use 0.0 oz/week     Comment: occ       Family History :     Family History  Problem Relation Age of Onset  . Emphysema Mother   . Heart attack Mother   . Hypertension Mother   . Diabetes Mother   . Heart disease Mother   . COPD Mother   . Emphysema Father   . COPD Father   . Stroke Sister   . Asthma Sister   . Colon cancer Neg Hx   . Anesthesia problems Neg Hx   . Hypotension Neg Hx   . Malignant hyperthermia Neg Hx   . Pseudochol deficiency Neg Hx       Home Medications:   Prior to Admission medications   Medication Sig Start Date End Date Taking? Authorizing Provider  albuterol (PROVENTIL HFA;VENTOLIN HFA) 108 (90 Base) MCG/ACT inhaler Inhale 2 puffs into the lungs every 6 (six) hours as needed for wheezing or shortness of breath. 02/26/17   [name], MD  amLODipine (NORVASC) 10 MG tablet Take 1 tablet (10 mg total) by mouth daily. 05/12/17   [name], MD  aspirin (ASPIRIN  ADULT LOW STRENGTH) 81 MG EC tablet Take 81 mg by mouth daily.      [provider]  atorvastatin (LIPITOR) 40 MG tablet Take 1 tablet (40 mg total) by mouth daily. 05/12/17   [name], MD  budesonide-formoterol (SYMBICORT) 80-4.5 MCG/ACT inhaler Inhale 2 puffs into the lungs 2 (two) times daily. 02/26/17   [name], MD  docusate sodium (COLACE) 100 MG capsule Take 100 mg by mouth 2 (two) times daily.    [provider]  FREESTYLE LITE test strip TEST four times a day 03/15/17   [name], MD  furosemide (LASIX) 40 MG tablet Take 1 tablet (40 mg total) by mouth daily. 05/12/17   [name], MD  insulin aspart (NOVOLOG) 100 UNIT/ML injection Inject 15 Units into the skin 3 (three) times daily before meals. Sliding scale begins at 15 units and increases as CBG dictates 03/23/15   Pollina, Gwenyth Allegra, MD  Insulin Glargine (LANTUS SOLOSTAR) 100 UNIT/ML Solostar Pen Inject 60 Units into the skin daily at 10 pm. Everynight    [provider]  ipratropium-albuterol (DUONEB) 0.5-2.5 (3) MG/3ML SOLN Take 3 mLs by nebulization every 6 (six) hours as needed. Patient taking differently: Take 3 mLs by nebulization every 6 (six) hours as needed (for wheezing or shortness of breath).  04/27/16   [name], MD  Lancets (FREESTYLE) lancets Four times daily testing dx e11.65 03/12/16   [name], MD  losartan (COZAAR) 25 MG tablet Take 12.5 mg by mouth daily.    [provider]  methocarbamol (ROBAXIN) 500 MG tablet Take 1 tablet (500 mg total) by mouth 3 (three) times daily. 06/11/17   Lily Kocher, PA-C  metoprolol succinate (TOPROL-XL) 50 MG 24 hr tablet take 1 tablet once daily with food 05/12/17   [name], MD  Multiple Vitamin (MULTIVITAMIN) capsule Take 1 capsule by mouth daily.    [provider]  nitroGLYCERIN (NITROSTAT) 0.4 MG SL tablet Place 1 tablet (0.4 mg total) under the tongue every 5 (five)  minutes as needed for chest pain. 05/29/16 11/06/19  Lendon Colonel, NP  potassium chloride SA (KLOR-CON M20) 20 MEQ tablet Take 1 tablet (20 mEq total) by mouth daily. 05/12/17   [name],  MD  PRESCRIPTION MEDICATION Take 5 mLs by mouth daily as needed. Magic mouthwash with nystatin    [provider]  spironolactone (ALDACTONE) 25 MG tablet Take 1 tablet (25 mg total) by mouth daily. 05/12/17   [name], MD  temazepam (RESTORIL) 7.5 MG capsule take 1 capsule by mouth at bedtime if needed 08/09/17   [name], MD  tiotropium [facility] HANDIHALER) 18 MCG inhalation capsule inhale the contents of one capsule in the handihaler once daily 05/12/17   [name], MD     Allergies:    No Known Allergies   Physical Exam:   Vitals  Blood pressure (!) 145/108, pulse (!) 110, temperature 98 F (36.7 C), temperature source Temporal, resp. rate 20, SpO2 100 %.  1.  General: Appears in mild respiratory distress, on BiPAP  2. Psychiatric:  Intact judgement and  insight, awake alert, oriented x 3.  3. Neurologic: No focal neurological deficits, all cranial nerves intact.Strength 5/5 all 4 extremities, sensation intact all 4 extremities, plantars down going.  4. Eyes :  anicteric sclerae, moist conjunctivae with no lid lag. PERRLA.  5. ENMT:  Oropharynx clear with moist mucous membranes and good dentition  6. Neck:  supple, no cervical lymphadenopathy appriciated, No thyromegaly  7. Respiratory : Tachypenic, bilateral rhonchi auscultated  8. Cardiovascular : RRR, no gallops, rubs or murmurs, no leg edema  9. Gastrointestinal:  Positive bowel sounds, abdomen soft, non-tender to palpation,no hepatosplenomegaly, no rigidity or guarding       10. Skin:  No cyanosis, normal texture and turgor, no rash, lesions or ulcers  11.Musculoskeletal:  Good muscle tone,  joints appear normal , no effusions,  normal range of motion    Data Review:      CBC  Recent Labs Lab 09/06/17 0336  WBC 9.1  HGB 15.1  HCT 44.1  PLT 251  MCV 85.6  MCH 29.3  MCHC 34.2  RDW 12.9  LYMPHSABS 3.3  MONOABS 0.5  EOSABS 1.1*  BASOSABS 0.1   ------------------------------------------------------------------------------------------------------------------  Chemistries   Recent Labs Lab 09/06/17 0336  NA 133*  K 3.5  CL 99*  CO2 23  GLUCOSE 528*  BUN 23*  CREATININE 1.36*  CALCIUM 8.9  AST 33  ALT 32  ALKPHOS 95  BILITOT 0.7   ------------------------------------------------------------------------------------------------------------------  ------------------------------------------------------------------------------------------------------------------ GFR: CrCl cannot be calculated (Unknown ideal weight.). Liver Function Tests:  Recent Labs Lab 09/06/17 0336  AST 33  ALT 32  ALKPHOS 95  BILITOT 0.7  PROT 8.2*  ALBUMIN 3.9   No results for input(s): LIPASE, AMYLASE in the last 168 hours. No results for input(s): AMMONIA in the last 168 hours. Coagulation Profile: No results for input(s): INR, PROTIME in the last 168 hours. Cardiac Enzymes:  Recent Labs Lab 09/06/17 0336  TROPONINI 0.03*   BNP (last 3 results) No results for input(s): PROBNP in the last 8760 hours. HbA1C: No results for input(s): HGBA1C in the last 72 hours. CBG:  Recent Labs Lab 09/06/17 0511  GLUCAP 456*    --------------------------------------------------------------------------------------------------------------- Urine analysis:    Component Value Date/Time   COLORURINE COLORLESS (A) 09/06/2017 0452   APPEARANCEUR CLEAR 09/06/2017 0452   LABSPEC 1.006 09/06/2017 0452   PHURINE 5.0 09/06/2017 0452   GLUCOSEU >=500 (A) 09/06/2017 0452   GLUCOSEU NEG mg/dL 03/04/2007 0829   HGBUR NEGATIVE 09/06/2017 0452   HGBUR negative 11/13/2010 [place] 09/06/2017 [place] 09/06/2017 0452   PROTEINUR  NEGATIVE 09/06/2017  6381   UROBILINOGEN 0.2 03/23/2015 1805   NITRITE NEGATIVE 09/06/2017 0452   LEUKOCYTESUR NEGATIVE 09/06/2017 0452      Imaging Results:    Dg Chest Port 1 View  Result Date: 09/06/2017 CLINICAL DATA:  Shortness of breath. History of congestive heart failure. EXAM: PORTABLE CHEST 1 VIEW COMPARISON:  Chest radiograph 07/29/2017 FINDINGS: Unchanged cardiomegaly. There is moderate pulmonary edema. No pleural effusion or pneumothorax. IMPRESSION: Cardiomegaly and moderate pulmonary edema. Electronically Signed   By: Ulyses Jarred M.D.   On: 09/06/2017 04:00    My personal review of EKG: Rhythm NSR, nonspecific ST changes   Assessment & Plan:    Active Problems:   Diabetes mellitus, insulin dependent (IDDM), uncontrolled (HCC)   Essential hypertension   NICM (nonischemic cardiomyopathy) ([place])   Pulmonary edema   CHF exacerbation ([place])   1. CHF exacerbation- patient has combined systolic and diastolic CHF, EF 77-11%. Presenting with moderate pulmonary edema seen on chest x-ray, he received Lasix 80 mg IV in the ED. We'll start Lasix 40 mg IV every 12 hours. Follow BMP in a.m. He does have mild elevation of troponin 0.06, cycle troponin every 6 hours 3. Also continue Nitropaste 0.5 inch every 6 hours. Continue BiPAP. 2. COPD exacerbation-patient has history of COPD, he does have bilateral rhonchi. Improved with albuterol in the ED. ABG shows PCO2 of 55. Will start DuoNeb nebulizers every 6 hours, Solu-Medrol 40 mg IV every 6 hours. 3. Uncontrolled diabetes mellitus- patient presents with hyperglycemia, blood glucose greater than 500. He was started on IV insulin the ED. Blood glucose has improved. We'll discontinue IV insulin and start resistant sliding scale, NovoLog meal coverage 15 units 3 times a day, Lantus 60 units subcutaneous daily at bedtime. Check CBG as per sliding scale. 4. Hypertension-blood pressure mildly elevated, continue home medications.    DVT  Prophylaxis-   Lovenox   Family communication-discussed with patient's wife at bedside  CODE STATUS-full code    [facility] S M.D on 09/06/2017 at 5:48 AM  Between 7am to 7pm - Pager - [phone]. After 7pm go to [email or web address] - password [facility]  [facility] - Office  [phone]

## 2017-09-06 NOTE — Progress Notes (Signed)
Change to CPAP 10, 40 Fi02.

## 2017-09-06 NOTE — Progress Notes (Addendum)
CRITICAL VALUE ALERT  Critical Value:  TROPONIN 0.25  Date & Time Notied:  09/06/17 1014  Provider Notified: DR.HERNANDEZ  Orders Received/Actions taken:no new orders

## 2017-09-06 NOTE — ED Notes (Signed)
CRITICAL VALUE ALERT  Critical Value:  Troponin 0.03 Date & Time Notied:  09/06/17@0419  Provider Notified:Dr Tomi Bamberger Orders Received/Actions taken:None yet

## 2017-09-06 NOTE — Progress Notes (Addendum)
Off CPAP/BiPAP on 4lpm nasal cannula patient moved to ICU

## 2017-09-06 NOTE — Care Management Note (Addendum)
Case Management Note  Patient Details  Name: Antonio Cobb MRN: [id number] Date of Birth: 30-Dec-1960  Subjective/Objective: Patient adm with CHF exacerbation. From home with family. Works full time. No HH or DME Acute needs for oxygen. Anticipate weaning to room air.  EF 25-30%, follow ups with [facility] in [place].               Action/Plan: Anticipate DC home with self care.    Expected Discharge Date:     09/07/2017             Expected Discharge Plan:  Home/Self Care  In-House Referral:     Discharge planning Services  CM Consult  Post Acute Care Choice:  NA Choice offered to:  NA  DME Arranged:    DME Agency:     HH Arranged:    HH Agency:     Status of Service:  Completed, signed off  If discussed at [employer] of Stay Meetings, dates discussed:    Additional Comments:  July Nickson, Chauncey Reading, RN 09/06/2017, 12:57 PM

## 2017-09-06 NOTE — ED Triage Notes (Signed)
Pt brought in by [facility] for c/o respiratory distress; pt reported he went to bed and woke up around 0245 having difficulty breathing; pt was found to have O2 sat of 83%; pt placed on cpap and sats increased to 93%; pt denies any pain at this time; pt given one sublingual nitro en route by ems

## 2017-09-07 DIAGNOSIS — I428 Other cardiomyopathies: Secondary | ICD-10-CM

## 2017-09-07 DIAGNOSIS — I5043 Acute on chronic combined systolic (congestive) and diastolic (congestive) heart failure: Secondary | ICD-10-CM

## 2017-09-07 LAB — GLUCOSE, CAPILLARY
GLUCOSE-CAPILLARY: 355 mg/dL — AB (ref 65–99)
GLUCOSE-CAPILLARY: 163 mg/dL — AB (ref 65–99)
Glucose-Capillary: 306 mg/dL — ABNORMAL HIGH (ref 65–99)
Glucose-Capillary: 132 mg/dL — ABNORMAL HIGH (ref 65–99)

## 2017-09-07 LAB — CBC
HCT: 40 % (ref 39.0–52.0)
Hemoglobin: 13.4 g/dL (ref 13.0–17.0)
MCH: 28.6 pg (ref 26.0–34.0)
MCHC: 33.5 g/dL (ref 30.0–36.0)
MCV: 85.3 fL (ref 78.0–100.0)
PLATELETS: 243 10*3/uL (ref 150–400)
RBC: 4.69 MIL/uL (ref 4.22–5.81)
RDW: 13.1 % (ref 11.5–15.5)
WBC: 14.9 10*3/uL — ABNORMAL HIGH (ref 4.0–10.5)

## 2017-09-07 LAB — COMPREHENSIVE METABOLIC PANEL
ALT: 25 U/L (ref 17–63)
ANION GAP: 11 (ref 5–15)
AST: 20 U/L (ref 15–41)
Albumin: 3.5 g/dL (ref 3.5–5.0)
Alkaline Phosphatase: 73 U/L (ref 38–126)
BUN: 33 mg/dL — ABNORMAL HIGH (ref 6–20)
CO2: 25 mmol/L (ref 22–32)
CREATININE: 1.39 mg/dL — AB (ref 0.61–1.24)
Calcium: 8.8 mg/dL — ABNORMAL LOW (ref 8.9–10.3)
Chloride: 99 mmol/L — ABNORMAL LOW (ref 101–111)
GFR calc non Af Amer: 55 mL/min — ABNORMAL LOW (ref >60)
Glucose, Bld: 331 mg/dL — ABNORMAL HIGH (ref 65–99)
POTASSIUM: 3.8 mmol/L (ref 3.5–5.1)
SODIUM: 135 mmol/L (ref 135–145)
Total Bilirubin: 1.1 mg/dL (ref 0.3–1.2)
Total Protein: 7.4 g/dL (ref 6.5–8.1)

## 2017-09-07 LAB — HIV ANTIBODY (ROUTINE TESTING W REFLEX): HIV Screen 4th Generation wRfx: NONREACTIVE

## 2017-09-07 MED ORDER — IPRATROPIUM-ALBUTEROL 0.5-2.5 (3) MG/3ML IN SOLN: 3.0000 mL | Freq: Four times a day (QID) | RESPIRATORY_TRACT | Status: DC | PRN

## 2017-09-07 MED ORDER — INSULIN ASPART 100 UNIT/ML ~~LOC~~ SOLN
18.0000 [IU] | Freq: Three times a day (TID) | SUBCUTANEOUS | Status: DC
  Administered 2017-09-07 – 2017-09-08 (×4): 18 [IU] via SUBCUTANEOUS

## 2017-09-07 MED ORDER — ALUM & MAG HYDROXIDE-SIMETH 200-200-20 MG/5ML PO SUSP
15.0000 mL | Freq: Once | ORAL | Status: AC
  Administered 2017-09-07: 15 mL via ORAL
  Filled 2017-09-07: qty 30

## 2017-09-07 NOTE — Progress Notes (Signed)
Inpatient Diabetes Program Recommendations  AACE/ADA: New Consensus Statement on Inpatient Glycemic Control (2015)  Target Ranges:  Prepandial:   less than 140 mg/dL      Peak postprandial:   less than 180 mg/dL (1-2 hours)      Critically ill patients:  140 - 180 mg/dL  Results for Antonio Cobb, Antonio Cobb (MRN [id number]) as of 09/07/2017 07:09  Ref. Range 09/07/2017 04:24  Glucose Latest Ref Range: 65 - 99 mg/dL 331 (H)   Results for Antonio Cobb, Antonio Cobb (MRN [id number]) as of 09/07/2017 07:09  Ref. Range 09/06/2017 08:13 09/06/2017 09:19 09/06/2017 10:17 09/06/2017 11:14 09/06/2017 12:20 09/06/2017 13:39 09/06/2017 16:37 09/06/2017 22:15  Glucose-Capillary Latest Ref Range: 65 - 99 mg/dL 314 (H) 292 (H) 278 (H) 251 (H) 229 (H) 201 (H) 298 (H) 378 (H)   Review of Glycemic Control  Diabetes history: DM2 Outpatient Diabetes medications: Lantus 60 units QHS, Novolog 15 units TID with meals plus additional units for correction Current orders for Inpatient glycemic control: Lantus 60 units QHS, Novolog 0-20 units TID with meals, Novolog 0-5 units QHS, Novolog 15 units TID with meals  Inpatient Diabetes Program Recommendations:  Insulin - Basal: If steroids continued as ordered, please consider increasing Lantus to 70 units and changing frequency to daily (since patient received Lantus 60 units at 12:46 yesterday). Insulin - Meal Coverage: Please consider increasing meal coverage to Novolog 20 units TID wtih meals. HgbA1C: Please consider ordering an A1C to evaluate glycemic control over the past 2-3 months.  NOTE: Patient was transitioned off IV insulin on 09/06/17; was given Lantus 60 units at 12:46 on 09/06/17. Patient is ordered Solumedrol 40 mg Q6H which is contributing to hyperglycemia.  Thanks, Barnie Alderman, RN, MSN, CDE Diabetes Coordinator Inpatient Diabetes Program [phone] (Team Pager from 8am to 5pm)

## 2017-09-07 NOTE — Progress Notes (Signed)
PROGRESS NOTE    Antonio Cobb  ZOX:[id number] DOB: January 26, 1961 DOA: 09/06/2017 PCP: [name], MD     Brief Narrative:  56 year old man admitted to the hospital from home on 9/20 due to shortness of breath. Is known to have ischemic cardiomyopathy with systolic heart failure and a known ejection fraction of 25-30%. He was found to have pulmonary edema on chest x-ray. Admission was requested.   Assessment & Plan:   Active Problems:   Diabetes mellitus, insulin dependent (IDDM), uncontrolled (HCC)   Essential hypertension   NICM (nonischemic cardiomyopathy) ([place])   Pulmonary edema   CHF exacerbation (HCC)   Acute hypoxemic respiratory failure -Due to acute CHF. -We'll work on weaning oxygen today. -Please see below for details. -Do not believe he has acute COPD and as such will discontinue steroids, can continue nebs as needed.  Acute on chronic systolic CHF -Known ejection fraction of 25-30%. -He is 3.5 L negative since admission. -He is breathing much more comfortably, however still appears volume overloaded on exam with lower extremity edema. -Plan to continue IV Lasix today at current dose of 40 mg IV twice daily. -Continue aspirin, atorvastatin, Lasix, Cozaar, Toprol-XL, spironolactone.  Hypertension -BP is low normal, continue current medications.  Uncontrolled DM -Required IV insulin on admission, this has been transitioned to subcutaneous insulin. -CBGs remain elevated, believe partly due to ongoing steroid use. -Continue Lantus at current dose of 60 units daily, will increase meal coverage NovoLog from 15-18 units 3 times a da.,   DVT prophylaxis: Lovenox Code Status: Full Code Family Communication: Wife and cousin at bedside updated on plan of care and all questions answered Disposition Plan: Transfer to floor; anticipate DC home in 24-48 hours.  Consultants:   None  Procedures:   None  Antimicrobials:  Anti-infectives    None        Subjective: Feels much improved; less SOB, no chest discomfort.  Objective: Vitals:   09/07/17 1100 09/07/17 1200 09/07/17 1300 09/07/17 1451  BP: 114/81 114/74 108/75   Pulse: 95 94 91   Resp: 18 17 20    Temp:      TempSrc:      SpO2: 96% 96% 95% 98%  Weight:      Height:        Intake/Output Summary (Last 24 hours) at 09/07/17 1510 Last data filed at 09/07/17 1200  Gross per 24 hour  Intake           233.33 ml  Output             1350 ml  Net         -1116.67 ml   Filed Weights   09/06/17 0636  Weight: 93.2 kg (205 lb 7.5 oz)    Examination:  General exam: Alert, awake, oriented x 3 Respiratory system: Clear to auscultation. Respiratory effort normal. Cardiovascular system:RRR. No murmurs, rubs, gallops. Gastrointestinal system: Abdomen is nondistended, soft and nontender. No organomegaly or masses felt. Normal bowel sounds heard. Central nervous system: Alert and oriented. No focal neurological deficits. Extremities: 1+ pitting edema bilaterally. Skin: No rashes, lesions or ulcers Psychiatry: Judgement and insight appear normal. Mood & affect appropriate.     Data Reviewed: I have personally reviewed following labs and imaging studies  CBC:  Recent Labs Lab 09/06/17 0336 09/07/17 0424  WBC 9.1 14.9*  NEUTROABS 4.2  --   HGB 15.1 13.4  HCT 44.1 40.0  MCV 85.6 85.3  PLT 251 811   Basic Metabolic Panel:  Recent Labs Lab 09/06/17 0336 09/07/17 0424  NA 133* 135  K 3.5 3.8  CL 99* 99*  CO2 23 25  GLUCOSE 528* 331*  BUN 23* 33*  CREATININE 1.36* 1.39*  CALCIUM 8.9 8.8*   GFR: Estimated Creatinine Clearance: 67.3 mL/min (A) (by C-G formula based on SCr of 1.39 mg/dL (H)). Liver Function Tests:  Recent Labs Lab 09/06/17 0336 09/07/17 0424  AST 33 20  ALT 32 25  ALKPHOS 95 73  BILITOT 0.7 1.1  PROT 8.2* 7.4  ALBUMIN 3.9 3.5   No results for input(s): LIPASE, AMYLASE in the last 168 hours. No results for input(s): AMMONIA in the  last 168 hours. Coagulation Profile: No results for input(s): INR, PROTIME in the last 168 hours. Cardiac Enzymes:  Recent Labs Lab 09/06/17 0336 09/06/17 0907 09/06/17 1319 09/06/17 1930  TROPONINI 0.03* 0.25* 0.28* 0.22*   BNP (last 3 results) No results for input(s): PROBNP in the last 8760 hours. HbA1C: No results for input(s): HGBA1C in the last 72 hours. CBG:  Recent Labs Lab 09/06/17 1339 09/06/17 1637 09/06/17 2215 09/07/17 0807 09/07/17 1133  GLUCAP 201* 298* 378* 355* 306*   Lipid Profile: No results for input(s): CHOL, HDL, LDLCALC, TRIG, CHOLHDL, LDLDIRECT in the last 72 hours. Thyroid Function Tests: No results for input(s): TSH, T4TOTAL, FREET4, T3FREE, THYROIDAB in the last 72 hours. Anemia Panel: No results for input(s): VITAMINB12, FOLATE, FERRITIN, TIBC, IRON, RETICCTPCT in the last 72 hours. Urine analysis:    Component Value Date/Time   COLORURINE COLORLESS (A) 09/06/2017 0452   APPEARANCEUR CLEAR 09/06/2017 0452   LABSPEC 1.006 09/06/2017 0452   PHURINE 5.0 09/06/2017 0452   GLUCOSEU >=500 (A) 09/06/2017 0452   GLUCOSEU NEG mg/dL 03/04/2007 0829   HGBUR NEGATIVE 09/06/2017 0452   HGBUR negative 11/13/2010 1522   BILIRUBINUR NEGATIVE 09/06/2017 0452   KETONESUR NEGATIVE 09/06/2017 0452   PROTEINUR NEGATIVE 09/06/2017 0452   UROBILINOGEN 0.2 03/23/2015 1805   NITRITE NEGATIVE 09/06/2017 0452   LEUKOCYTESUR NEGATIVE 09/06/2017 0452   Sepsis Labs: @LABRCNTIP (procalcitonin:4,lacticidven:4)  ) Recent Results (from the past 240 hour(s))  MRSA PCR Screening     Status: None   Collection Time: 09/06/17  6:22 AM  Result Value Ref Range Status   MRSA by PCR NEGATIVE NEGATIVE Final    Comment:        The GeneXpert MRSA Assay (FDA approved for NASAL specimens only), is one component of a comprehensive MRSA colonization surveillance program. It is not intended to diagnose MRSA infection nor to guide or monitor treatment for MRSA  infections.          Radiology Studies: Dg Chest Port 1 View  Result Date: 09/06/2017 CLINICAL DATA:  Shortness of breath. History of congestive heart failure. EXAM: PORTABLE CHEST 1 VIEW COMPARISON:  Chest radiograph 07/29/2017 FINDINGS: Unchanged cardiomegaly. There is moderate pulmonary edema. No pleural effusion or pneumothorax. IMPRESSION: Cardiomegaly and moderate pulmonary edema. Electronically Signed   By: Ulyses Jarred M.D.   On: 09/06/2017 04:00        Scheduled Meds: . amLODipine  10 mg Oral Daily  . aspirin EC  81 mg Oral Daily  . atorvastatin  40 mg Oral Daily  . docusate sodium  100 mg Oral BID  . enoxaparin (LOVENOX) injection  40 mg Subcutaneous Q24H  . furosemide  40 mg Intravenous Q12H  . insulin aspart  0-20 Units Subcutaneous TID WC  . insulin aspart  0-5 Units Subcutaneous QHS  . insulin aspart  15 Units Subcutaneous TID AC  . insulin glargine  60 Units Subcutaneous QHS  . ipratropium-albuterol  3 mL Nebulization Q6H  . losartan  12.5 mg Oral Daily  . methocarbamol  500 mg Oral TID  . metoprolol succinate  50 mg Oral Daily  . nitroGLYCERIN  0.5 inch Topical Q6H  . potassium chloride SA  20 mEq Oral Daily  . spironolactone  25 mg Oral Daily   Continuous Infusions: . sodium chloride 10 mL/hr at 09/07/17 0800     LOS: 1 day    Time spent: 35 minutes. Greater than 50% of this time was spent in direct contact with the patient coordinating care.     Lelon Frohlich, MD [facility] Pager [phone]  If 7PM-7AM, please contact night-coverage [email or web address] Password [email or web address] 09/07/2017, 3:10 PM

## 2017-09-08 DIAGNOSIS — E1022 Type 1 diabetes mellitus with diabetic chronic kidney disease: Secondary | ICD-10-CM

## 2017-09-08 DIAGNOSIS — N179 Acute kidney failure, unspecified: Secondary | ICD-10-CM

## 2017-09-08 DIAGNOSIS — I5023 Acute on chronic systolic (congestive) heart failure: Secondary | ICD-10-CM

## 2017-09-08 DIAGNOSIS — E1065 Type 1 diabetes mellitus with hyperglycemia: Secondary | ICD-10-CM

## 2017-09-08 DIAGNOSIS — I10 Essential (primary) hypertension: Secondary | ICD-10-CM

## 2017-09-08 DIAGNOSIS — N183 Chronic kidney disease, stage 3 (moderate): Secondary | ICD-10-CM

## 2017-09-08 LAB — CBC
HCT: 42.4 % (ref 39.0–52.0)
Hemoglobin: 13.2 g/dL (ref 13.0–17.0)
MCH: 26.7 pg (ref 26.0–34.0)
MCHC: 31.1 g/dL (ref 30.0–36.0)
MCV: 85.8 fL (ref 78.0–100.0)
PLATELETS: 233 10*3/uL (ref 150–400)
RBC: 4.94 MIL/uL (ref 4.22–5.81)
RDW: 13.3 % (ref 11.5–15.5)
WBC: 11.6 10*3/uL — AB (ref 4.0–10.5)

## 2017-09-08 LAB — BASIC METABOLIC PANEL
ANION GAP: 9 (ref 5–15)
BUN: 32 mg/dL — AB (ref 6–20)
CHLORIDE: 100 mmol/L — AB (ref 101–111)
CO2: 28 mmol/L (ref 22–32)
Calcium: 9.2 mg/dL (ref 8.9–10.3)
Creatinine, Ser: 1.16 mg/dL (ref 0.61–1.24)
GFR calc non Af Amer: >60 mL/min (ref >60)
Glucose, Bld: 157 mg/dL — ABNORMAL HIGH (ref 65–99)
POTASSIUM: 3.7 mmol/L (ref 3.5–5.1)
SODIUM: 137 mmol/L (ref 135–145)

## 2017-09-08 LAB — GLUCOSE, CAPILLARY
GLUCOSE-CAPILLARY: 148 mg/dL — AB (ref 65–99)
GLUCOSE-CAPILLARY: 200 mg/dL — AB (ref 65–99)
Glucose-Capillary: 107 mg/dL — ABNORMAL HIGH (ref 65–99)
Glucose-Capillary: 126 mg/dL — ABNORMAL HIGH (ref 65–99)

## 2017-09-08 LAB — TROPONIN I: Troponin I: 0.12 ng/mL (ref <0.03)

## 2017-09-08 LAB — HEMOGLOBIN A1C
HEMOGLOBIN A1C: 10.8 % — AB (ref 4.8–5.6)
MEAN PLASMA GLUCOSE: 263.26 mg/dL

## 2017-09-08 MED ORDER — BISACODYL 5 MG PO TBEC
5.0000 mg | DELAYED_RELEASE_TABLET | Freq: Every day | ORAL | Status: DC | PRN
Start: 2017-09-08 — End: 2017-09-09
  Administered 2017-09-08: 5 mg via ORAL
  Filled 2017-09-08: qty 1

## 2017-09-08 NOTE — Progress Notes (Signed)
CRITICAL VALUE ALERT  Critical Value:  Troponin 0.12   Date & Time Notied:  06/08/2017 0949  Provider Notified: Dr. Gevena Barre notified via text page.  Orders Received/Actions taken: No new orders at this time.  Continue to monitor - Troponin trending down.

## 2017-09-08 NOTE — Progress Notes (Signed)
PROGRESS NOTE  Antonio Cobb FVC:[id number] DOB: 1961-03-09 DOA: 09/06/2017 PCP: [name], MD  HPI/Recap of past 37 hours: 56 year old male with past medical history of systolic heart failure and poorly controlled diabetes mellitus admitted on early morning of 9/24. Started on IV Lasix. Troponins minimally elevated although trending downwards and more related to acute CHF. Following admission, patient has responded well to Lasix and diuresed him as 5 L. Weight is down several pounds. He is currently at 203 pounds with a dry weight of 200-202.   Patient today doing okay. No complaints. No shortness of breath.  Assessment/Plan: Active Problems:   Diabetes mellitus, insulin dependent (IDDM), uncontrolled (HCC) A1c at 10.8 giving an average blood sugar of 268. Had extensive discussion with patient about diabetic control and side effects. He is eager to learn more and we'll make referral for outpatient diabetes education.   Essential hypertension: Blood pressure stable, much improved following diuresis   Acute systolic CHF exacerbation (HCC) secondary to nonischemic cardio myopathy: Continue diuresis. Should be a dry weight by tomorrow.  Acute kidney injury: Patient with history of stage I chronic kidney disease. Came in with creatinine of 1.36 however once he started diuresis, creatinine today 1.16 with GFR now greater than 60.  Code Status: Full code   Family Communication: Patient did not want me to call family.   Disposition Plan: Anticipate discharge tomorrow once fully diuresed    Consultants:  None   Procedures:  None   Antimicrobials:  None   DVT prophylaxis:  Lovenox   Objective: Vitals:   09/07/17 2112 09/08/17 0510 09/08/17 0913 09/08/17 1500  BP: 114/83 111/77  116/75  Pulse: 92 93  88  Resp: 18 18  19   Temp: 98 F (36.7 C) 98.5 F (36.9 C)  99.4 F (37.4 C)  TempSrc: Oral Oral  Oral  SpO2: 98% 98%  99%  Weight:   92.6 kg (204 lb 4 oz)     Height:        Intake/Output Summary (Last 24 hours) at 09/08/17 1620 Last data filed at 09/08/17 1500  Gross per 24 hour  Intake              840 ml  Output             2150 ml  Net            -1310 ml   Filed Weights   09/06/17 0636 09/08/17 0913  Weight: 93.2 kg (205 lb 7.5 oz) 92.6 kg (204 lb 4 oz)    Exam:   General:  Gen.: Alert and oriented 3, no acute distress  HEENT: No cephalic atraumatic, mucous membranes are moist  Neck: Supple, no JVD   Cardiovascular: Regular rate and rhythm, M3-W4, soft 2/6 systolic ejection murmur   Respiratory: Clear to auscultation bilaterally   Abdomen: Soft, nontender, mildly distended, hypoactive bowel sounds   Musculoskeletal: No clubbing or cyanosis, trace pitting edema   Skin: No skin breaks, tears or lesions  Neuro: Some mild decreased sensation secondary to chronic peripheral neuropathy, otherwise no focal deficits  Psychiatry: Appropriate, no evidence of psychoses    Data Reviewed: CBC:  Recent Labs Lab 09/06/17 0336 09/07/17 0424 09/08/17 0540  WBC 9.1 14.9* 11.6*  NEUTROABS 4.2  --   --   HGB 15.1 13.4 13.2  HCT 44.1 40.0 42.4  MCV 85.6 85.3 85.8  PLT 251 243 665   Basic Metabolic Panel:  Recent Labs Lab 09/06/17 0336 09/07/17  0424 09/08/17 0540  NA 133* 135 137  K 3.5 3.8 3.7  CL 99* 99* 100*  CO2 23 25 28   GLUCOSE 528* 331* 157*  BUN 23* 33* 32*  CREATININE 1.36* 1.39* 1.16  CALCIUM 8.9 8.8* 9.2   GFR: Estimated Creatinine Clearance: 80.3 mL/min (by C-G formula based on SCr of 1.16 mg/dL). Liver Function Tests:  Recent Labs Lab 09/06/17 0336 09/07/17 0424  AST 33 20  ALT 32 25  ALKPHOS 95 73  BILITOT 0.7 1.1  PROT 8.2* 7.4  ALBUMIN 3.9 3.5   No results for input(s): LIPASE, AMYLASE in the last 168 hours. No results for input(s): AMMONIA in the last 168 hours. Coagulation Profile: No results for input(s): INR, PROTIME in the last 168 hours. Cardiac Enzymes:  Recent Labs Lab  09/06/17 0336 09/06/17 0907 09/06/17 1319 09/06/17 1930 09/08/17 0540  TROPONINI 0.03* 0.25* 0.28* 0.22* 0.12*   BNP (last 3 results) No results for input(s): PROBNP in the last 8760 hours. HbA1C:  Recent Labs  09/08/17 0540  HGBA1C 10.8*   CBG:  Recent Labs Lab 09/07/17 1133 09/07/17 1636 09/07/17 2106 09/08/17 0750 09/08/17 1158  GLUCAP 306* 132* 163* 148* 107*   Lipid Profile: No results for input(s): CHOL, HDL, LDLCALC, TRIG, CHOLHDL, LDLDIRECT in the last 72 hours. Thyroid Function Tests: No results for input(s): TSH, T4TOTAL, FREET4, T3FREE, THYROIDAB in the last 72 hours. Anemia Panel: No results for input(s): VITAMINB12, FOLATE, FERRITIN, TIBC, IRON, RETICCTPCT in the last 72 hours. Urine analysis:    Component Value Date/Time   COLORURINE COLORLESS (A) 09/06/2017 0452   APPEARANCEUR CLEAR 09/06/2017 0452   LABSPEC 1.006 09/06/2017 0452   PHURINE 5.0 09/06/2017 0452   GLUCOSEU >=500 (A) 09/06/2017 0452   GLUCOSEU NEG mg/dL 03/04/2007 0829   HGBUR NEGATIVE 09/06/2017 0452   HGBUR negative 11/13/2010 1522   BILIRUBINUR NEGATIVE 09/06/2017 0452   KETONESUR NEGATIVE 09/06/2017 0452   PROTEINUR NEGATIVE 09/06/2017 0452   UROBILINOGEN 0.2 03/23/2015 1805   NITRITE NEGATIVE 09/06/2017 0452   LEUKOCYTESUR NEGATIVE 09/06/2017 0452   Sepsis Labs: @LABRCNTIP (procalcitonin:4,lacticidven:4)  ) Recent Results (from the past 240 hour(s))  MRSA PCR Screening     Status: None   Collection Time: 09/06/17  6:22 AM  Result Value Ref Range Status   MRSA by PCR NEGATIVE NEGATIVE Final    Comment:        The GeneXpert MRSA Assay (FDA approved for NASAL specimens only), is one component of a comprehensive MRSA colonization surveillance program. It is not intended to diagnose MRSA infection nor to guide or monitor treatment for MRSA infections.       Studies: No results found.  Scheduled Meds: . amLODipine  10 mg Oral Daily  . aspirin EC  81 mg Oral  Daily  . atorvastatin  40 mg Oral Daily  . docusate sodium  100 mg Oral BID  . enoxaparin (LOVENOX) injection  40 mg Subcutaneous Q24H  . furosemide  40 mg Intravenous Q12H  . insulin aspart  0-20 Units Subcutaneous TID WC  . insulin aspart  0-5 Units Subcutaneous QHS  . insulin aspart  18 Units Subcutaneous TID AC  . insulin glargine  60 Units Subcutaneous QHS  . losartan  12.5 mg Oral Daily  . methocarbamol  500 mg Oral TID  . metoprolol succinate  50 mg Oral Daily  . nitroGLYCERIN  0.5 inch Topical Q6H  . potassium chloride SA  20 mEq Oral Daily  . spironolactone  25 mg Oral  Daily    Continuous Infusions:   LOS: 2 days     Annita Brod, MD [facility] Pager [phone]  If 7PM-7AM, please contact night-coverage [email or web address] Password [email or web address] 09/08/2017, 4:20 PM

## 2017-09-09 DIAGNOSIS — I509 Heart failure, unspecified: Secondary | ICD-10-CM

## 2017-09-09 LAB — BASIC METABOLIC PANEL
Anion gap: 9 (ref 5–15)
BUN: 31 mg/dL — AB (ref 6–20)
CALCIUM: 8.9 mg/dL (ref 8.9–10.3)
CHLORIDE: 101 mmol/L (ref 101–111)
CO2: 27 mmol/L (ref 22–32)
CREATININE: 1.12 mg/dL (ref 0.61–1.24)
GFR calc non Af Amer: >60 mL/min (ref >60)
Glucose, Bld: 93 mg/dL (ref 65–99)
Potassium: 3.5 mmol/L (ref 3.5–5.1)
Sodium: 137 mmol/L (ref 135–145)

## 2017-09-09 LAB — CBC
HCT: 42.1 % (ref 39.0–52.0)
HEMOGLOBIN: 13.9 g/dL (ref 13.0–17.0)
MCH: 28.3 pg (ref 26.0–34.0)
MCHC: 33 g/dL (ref 30.0–36.0)
MCV: 85.7 fL (ref 78.0–100.0)
Platelets: 231 10*3/uL (ref 150–400)
RBC: 4.91 MIL/uL (ref 4.22–5.81)
RDW: 13.1 % (ref 11.5–15.5)
WBC: 8.3 10*3/uL (ref 4.0–10.5)

## 2017-09-09 LAB — GLUCOSE, CAPILLARY
GLUCOSE-CAPILLARY: 120 mg/dL — AB (ref 65–99)
Glucose-Capillary: 88 mg/dL (ref 65–99)

## 2017-09-09 MED ORDER — FUROSEMIDE 40 MG PO TABS: 40.0000 mg | ORAL_TABLET | Freq: Every day | ORAL | 1 refills | Status: DC

## 2017-09-09 MED ORDER — FUROSEMIDE 40 MG PO TABS: 40.0000 mg | ORAL_TABLET | Freq: Two times a day (BID) | ORAL | 1 refills | Status: DC

## 2017-09-09 MED ORDER — FUROSEMIDE 20 MG PO TABS
20.0000 mg | ORAL_TABLET | Freq: Every evening | ORAL | 2 refills | Status: DC
Start: 2017-09-09 — End: 2017-09-23

## 2017-09-09 NOTE — Discharge Summary (Signed)
Discharge Summary  Antonio Cobb DTO:[id number] DOB: [birth year]  PCP: [name], MD  Admit date: 09/06/2017 Discharge date: 09/09/2017  Time spent: 40 minutes  Recommendations for Outpatient Follow-up:  1. Patient referred for outpatient diabetes education classes. 2. He'll follow-up with his PCP in the next one month. 3. Medication change: Lasix increased from 40 mg daily to 40 mg in the morning and 20 mg in the evening. 4. Patient follow up with his cardiologist in 2 weeks. Note patient is a dry weight at 203 pounds.   Discharge Diagnoses:  Active Hospital Problems   Diagnosis Date Noted  . CHF exacerbation ([place]) 09/06/2017  . Pulmonary edema 07/29/2017  . NICM (nonischemic cardiomyopathy) ([place]) 05/23/2012  . Essential hypertension 03/08/2008  . Diabetes mellitus, insulin dependent (IDDM), uncontrolled ([place]) 03/08/2008    Resolved Hospital Problems   Diagnosis Date Noted Date Resolved  No resolved problems to display.    Discharge Condition: Improved, being discharged home   Diet recommendation: Heart healthy, carb modified   Vitals:   09/08/17 2117 09/09/17 0524  BP: 127/84 120/75  Pulse: 86 84  Resp: 18 18  Temp: 98.1 F (36.7 C) 98 F (36.7 C)  SpO2: 99% 100%    History of present illness:  56 year old male with past medical history of systolic heart failure and poorly controlled diabetes mellitus admitted on early morning of 9/24. Started on IV Lasix. Troponins minimally elevated although trending downwards and more related to acute CHF.   Hospital Course:  Active Problems:   Diabetes mellitus, insulin dependent (IDDM), uncontrolled ([place]) with secondary renal dysfunction: A1c at 10.8 with average blood sugar of 268. Had extensive discussion with patient that diabetic control and side effects. Have referred him for outpatient diabetes education. He admits at times that sometimes he falls asleep before taking his evening Lantus. I pressed upon him  the importance of taking his medications.   Essential hypertension: The pressure stable, much improved following diuresis. Overweight: Patient meets criteria with BMI greater than 25 Acute systolic CHF exacerbation secondary to nonischemic cardiomyopathy: Patient responded well to Lasix diuresing over 6 L and by day of discharge, he was a dry weight of 203 pounds. Acute kidney injury in the setting of stage I chronic kidney disease. Came in with creatinine of 1.36 however once he started diuresis, creatinine by day of discharge at 1.12 with GFR now greater than 60.  Procedures:  None   Consultations:  None   Discharge Exam: BP 120/75 (BP Location: Right Arm)   Pulse 84   Temp 98 F (36.7 C) (Oral)   Resp 18   Ht 5\' 10"  (1.778 m)   Wt 92.1 kg (203 lb 1.6 oz)   SpO2 100%   BMI 29.14 kg/m   General: Alert and oriented 3, no acute distress  Cardiovascular: Regular rate and rhythm, S1-S2  Respiratory: Clear to auscultation bilaterally   Discharge Instructions You were cared for by a hospitalist during your hospital stay. If you have any questions about your discharge medications or the care you received while you were in the hospital after you are discharged, you can call the unit and asked to speak with the hospitalist on call if the hospitalist that took care of you is not available. Once you are discharged, your primary care physician will handle any further medical issues. Please note that NO REFILLS for any discharge medications will be authorized once you are discharged, as it is imperative that you return to your primary care  physician (or establish a relationship with a primary care physician if you do not have one) for your aftercare needs so that they can reassess your need for medications and monitor your lab values.  Discharge Instructions    Diet - low sodium heart healthy    Complete by:  As directed    Increase activity slowly    Complete by:  As directed       Allergies as of 09/09/2017   No Known Allergies     Medication List    STOP taking these medications   PRESCRIPTION MEDICATION     TAKE these medications   albuterol 108 (90 Base) MCG/ACT inhaler Commonly known as:  PROVENTIL HFA;VENTOLIN HFA Inhale 2 puffs into the lungs every 6 (six) hours as needed for wheezing or shortness of breath.   amLODipine 10 MG tablet Commonly known as:  NORVASC Take 1 tablet (10 mg total) by mouth daily.   ASPIRIN ADULT LOW STRENGTH 81 MG EC tablet Generic drug:  aspirin Take 81 mg by mouth daily.   atorvastatin 40 MG tablet Commonly known as:  LIPITOR Take 1 tablet (40 mg total) by mouth daily.   budesonide-formoterol 80-4.5 MCG/ACT inhaler Commonly known as:  SYMBICORT Inhale 2 puffs into the lungs 2 (two) times daily.   docusate sodium 100 MG capsule Commonly known as:  COLACE Take 100 mg by mouth 2 (two) times daily.   freestyle lancets Four times daily testing dx e11.65   FREESTYLE LITE test strip Generic drug:  glucose blood TEST four times a day   furosemide 40 MG tablet Commonly known as:  LASIX Take 1 tablet (40 mg total) by mouth daily. What changed:  Another medication with the same name was added. Make sure you understand how and when to take each.   furosemide 20 MG tablet Commonly known as:  LASIX Take 1 tablet (20 mg total) by mouth every evening. What changed:  You were already taking a medication with the same name, and this prescription was added. Make sure you understand how and when to take each.   insulin aspart 100 UNIT/ML injection Commonly known as:  novoLOG Inject 15 Units into the skin 3 (three) times daily before meals. Sliding scale begins at 15 units and increases as CBG dictates   ipratropium-albuterol 0.5-2.5 (3) MG/3ML Soln Commonly known as:  DUONEB Take 3 mLs by nebulization every 6 (six) hours as needed. What changed:  reasons to take this   LANTUS SOLOSTAR 100 UNIT/ML Solostar  Pen Generic drug:  Insulin Glargine Inject 60 Units into the skin daily at 10 pm. Everynight   losartan 25 MG tablet Commonly known as:  COZAAR Take 12.5 mg by mouth daily.   methocarbamol 500 MG tablet Commonly known as:  ROBAXIN Take 1 tablet (500 mg total) by mouth 3 (three) times daily.   metoprolol succinate 50 MG 24 hr tablet Commonly known as:  TOPROL-XL take 1 tablet once daily with food   multivitamin capsule Take 1 capsule by mouth daily.   nitroGLYCERIN 0.4 MG SL tablet Commonly known as:  NITROSTAT Place 1 tablet (0.4 mg total) under the tongue every 5 (five) minutes as needed for chest pain.   potassium chloride SA 20 MEQ tablet Commonly known as:  KLOR-CON M20 Take 1 tablet (20 mEq total) by mouth daily.   spironolactone 25 MG tablet Commonly known as:  ALDACTONE Take 1 tablet (25 mg total) by mouth daily.   temazepam 7.5 MG capsule Commonly known  as:  RESTORIL take 1 capsule by mouth at bedtime if needed   tiotropium 18 MCG inhalation capsule Commonly known as:  SPIRIVA HANDIHALER inhale the contents of one capsule in the handihaler once daily            Discharge Care Instructions        Start     Ordered   09/09/17 0000  furosemide (LASIX) 40 MG tablet  Daily     09/09/17 1131   09/09/17 0000  furosemide (LASIX) 20 MG tablet  Every evening     09/09/17 1131   09/09/17 0000  Increase activity slowly     09/09/17 1131   09/09/17 0000  Diet - low sodium heart healthy     09/09/17 1131     No Known Allergies Follow-up Information    [name], MD On 09/14/2017.   Specialty:  Family Medicine Why:  at 4:00 pm Contact information: [address] [place] [address] [phone]        Arnoldo Lenis, MD On 09/23/2017.   Specialty:  Cardiology Why:  at 1:00pm Contact information: [address] [place] [address] [phone]            The results of significant diagnostics from this  hospitalization (including imaging, microbiology, ancillary and laboratory) are listed below for reference.    Significant Diagnostic Studies: Dg Chest Port 1 View  Result Date: 09/06/2017 CLINICAL DATA:  Shortness of breath. History of congestive heart failure. EXAM: PORTABLE CHEST 1 VIEW COMPARISON:  Chest radiograph 07/29/2017 FINDINGS: Unchanged cardiomegaly. There is moderate pulmonary edema. No pleural effusion or pneumothorax. IMPRESSION: Cardiomegaly and moderate pulmonary edema. Electronically Signed   By: Ulyses Jarred M.D.   On: 09/06/2017 04:00    Microbiology: Recent Results (from the past 240 hour(s))  MRSA PCR Screening     Status: None   Collection Time: 09/06/17  6:22 AM  Result Value Ref Range Status   MRSA by PCR NEGATIVE NEGATIVE Final    Comment:        The GeneXpert MRSA Assay (FDA approved for NASAL specimens only), is one component of a comprehensive MRSA colonization surveillance program. It is not intended to diagnose MRSA infection nor to guide or monitor treatment for MRSA infections.      Labs: Basic Metabolic Panel:  Recent Labs Lab 09/06/17 0336 09/07/17 0424 09/08/17 0540 09/09/17 0434  NA 133* 135 137 137  K 3.5 3.8 3.7 3.5  CL 99* 99* 100* 101  CO2 23 25 28 27   GLUCOSE 528* 331* 157* 93  BUN 23* 33* 32* 31*  CREATININE 1.36* 1.39* 1.16 1.12  CALCIUM 8.9 8.8* 9.2 8.9   Liver Function Tests:  Recent Labs Lab 09/06/17 0336 09/07/17 0424  AST 33 20  ALT 32 25  ALKPHOS 95 73  BILITOT 0.7 1.1  PROT 8.2* 7.4  ALBUMIN 3.9 3.5   No results for input(s): LIPASE, AMYLASE in the last 168 hours. No results for input(s): AMMONIA in the last 168 hours. CBC:  Recent Labs Lab 09/06/17 0336 09/07/17 0424 09/08/17 0540 09/09/17 0434  WBC 9.1 14.9* 11.6* 8.3  NEUTROABS 4.2  --   --   --   HGB 15.1 13.4 13.2 13.9  HCT 44.1 40.0 42.4 42.1  MCV 85.6 85.3 85.8 85.7  PLT 251 243 233 231   Cardiac Enzymes:  Recent Labs Lab  09/06/17 0336 09/06/17 0907 09/06/17 1319 09/06/17 1930 09/08/17 0540  TROPONINI  0.03* 0.25* 0.28* 0.22* 0.12*   BNP: BNP (last 3 results)  Recent Labs  10/07/16 0700 07/29/17 0837 09/06/17 0336  BNP 374.0* 315.0* 376.0*    ProBNP (last 3 results) No results for input(s): PROBNP in the last 8760 hours.  CBG:  Recent Labs Lab 09/08/17 1158 09/08/17 1652 09/08/17 2116 09/09/17 0740 09/09/17 1129  GLUCAP 107* 126* 200* 88 120*       Signed:  Annita Brod, MD [facility] 09/09/2017, 3:18 PM

## 2017-09-14 ENCOUNTER — Encounter: Payer: Self-pay | Admitting: Family Medicine

## 2017-09-14 ENCOUNTER — Ambulatory Visit (INDEPENDENT_AMBULATORY_CARE_PROVIDER_SITE_OTHER): Payer: BLUE CROSS/BLUE SHIELD | Admitting: Family Medicine

## 2017-09-14 VITALS — BP 110/64 | HR 65 | Temp 98.0°F | Resp 16 | Ht 70.0 in | Wt 211.8 lb

## 2017-09-14 DIAGNOSIS — Z09 Encounter for follow-up examination after completed treatment for conditions other than malignant neoplasm: Secondary | ICD-10-CM | POA: Diagnosis not present

## 2017-09-14 DIAGNOSIS — Z23 Encounter for immunization: Secondary | ICD-10-CM | POA: Diagnosis not present

## 2017-09-14 DIAGNOSIS — I10 Essential (primary) hypertension: Secondary | ICD-10-CM

## 2017-09-14 DIAGNOSIS — E785 Hyperlipidemia, unspecified: Secondary | ICD-10-CM

## 2017-09-14 NOTE — Patient Instructions (Addendum)
F/u in 4.5 months. Call if you need me before  PLEASE schedule appointment wih Dr Dorris Fetch, asap, GO to his office tomorrow and make the appt  Keep the appointment with cardiology on 10/11 as scheduled.  You will get work excuse today from this office from 9/24 to return 09/13/2017  Follow heart fa8ilue and diabetic diet if you want to live  You need to stop smoking and drinking alcohol also entirely  Flu vaccine today

## 2017-09-20 NOTE — Assessment & Plan Note (Signed)
Hyperlipidemia:Low fat diet discussed and encouraged.   Lipid Panel  Lab Results  Component Value Date   CHOL 140 02/09/2017   HDL 26 (L) 02/09/2017   LDLCALC 87 02/09/2017   LDLDIRECT TEST NOT PERFORMED 06/19/2011   TRIG 133 02/09/2017   CHOLHDL 5.4 (H) 02/09/2017   Not at goal Updated lab needed at/ before next visit.

## 2017-09-20 NOTE — Progress Notes (Signed)
Antonio Cobb     MRN: [id number]      DOB: April 11, 1961   HPI Antonio Cobb is here for follow up recent hospitalization from 9/24 to 09/09/2017 for acute on chronic  heart failure , he was also noted at the time to have markedly uncontrolled blood sugar, which is not new nd he still has not made or kept an appt with his endocrinologist His discharge dry weight is 203 pounds , he is 8 pounds over this on the day of the visit, he denies dietary indiscretion Still does admit to "occasional" alcohol and nicotine use States he was advised to return to work on 9/28, however , did not feel able and he returned on 09/13/2017 which , based on his health history  Is appropriate in my opinion  ROS Denies recent fever or chills. Denies sinus pressure, nasal congestion, ear pain or sore throat. Denies chest congestion, productive cough or wheezing.  Denies abdominal pain, nausea, vomiting,diarrhea or constipation.   Denies dysuria, frequency, hesitancy or incontinence. Denies joint pain, swelling and limitation in mobility. Denies headaches, seizures, numbness, or tingling. Denies depression, anxiety or insomnia. Denies skin break down or rash.   PE  BP 110/64 (BP Location: Left Arm, Patient Position: Sitting, Cuff Size: Normal)   Pulse 65   Temp 98 F (36.7 C) (Other (Comment))   Resp 16   Ht 5\' 10"  (1.778 m)   Wt 211 lb 12 oz (96 kg)   SpO2 95%   BMI 30.38 kg/m   Patient alert and oriented and in no cardiopulmonary distress.  HEENT: No facial asymmetry, EOMI,   oropharynx pink and moist.  Neck supple no JVD, no mass.  Chest: Clear to auscultation bilaterally.  CVS: S1, S2 no murmurs, no S3.Regular rate.  ABD: Soft non tender.   Ext: No edema  MS: Adequate ROM spine, shoulders, hips and knees.  Skin: Intact, no ulcerations or rash noted.  Psych: Good eye contact, normal affect. Memory intact not anxious or depressed appearing.  CNS: CN 2-12 intact, power,  normal  throughout.no focal deficits noted.   Assessment & Plan  Encounter for examination following treatment at hospital Recurrent episodes of acute heart failure, re educated patient and spouse of the importance of medical compliance as afgar as quality and duration of life. Has already gained 8 pounds from ideal dry weright , which is concerning OK extended return to work for 09/13/2017 Reiterated the need for cloe fuwith cardiology   Essential hypertension DASH diet and commitment to daily physical activity for a minimum of 30 minutes discussed and encouraged, as a part of hypertension management. The importance of attaining a healthy weight is also discussed.  BP/Weight 09/14/2017 09/09/2017 08/12/2017 08/02/2017 07/30/2017 07/29/2017 06/12/5283  Systolic BP 132 440 102 725 366 - 440  Diastolic BP 64 75 80 80 93 - 86  Wt. (Lbs) 211.75 203.1 209 208 - 209 207  BMI 30.38 29.14 30.42 29.84 - 29.99 29.7       Diabetes mellitus, insulin dependent (IDDM), uncontrolled (HCC) Uncontroled, non compliant, needs endo treatment  Hyperlipidemia LDL goal <70 Hyperlipidemia:Low fat diet discussed and encouraged.   Lipid Panel  Lab Results  Component Value Date   CHOL 140 02/09/2017   HDL 26 (L) 02/09/2017   LDLCALC 87 02/09/2017   LDLDIRECT TEST NOT PERFORMED 06/19/2011   TRIG 133 02/09/2017   CHOLHDL 5.4 (H) 02/09/2017   Not at goal Updated lab needed at/ before next visit.

## 2017-09-20 NOTE — Assessment & Plan Note (Signed)
Recurrent episodes of acute heart failure, re educated patient and spouse of the importance of medical compliance as afgar as quality and duration of life. Has already gained 8 pounds from ideal dry weright , which is concerning OK extended return to work for 09/13/2017 Reiterated the need for cloe fuwith cardiology

## 2017-09-20 NOTE — Assessment & Plan Note (Signed)
DASH diet and commitment to daily physical activity for a minimum of 30 minutes discussed and encouraged, as a part of hypertension management. The importance of attaining a healthy weight is also discussed.  BP/Weight 09/14/2017 09/09/2017 08/12/2017 08/02/2017 07/30/2017 07/29/2017 05/12/510  Systolic BP 021 117 356 701 410 - 301  Diastolic BP 64 75 80 80 93 - 86  Wt. (Lbs) 211.75 203.1 209 208 - 209 207  BMI 30.38 29.14 30.42 29.84 - 29.99 29.7

## 2017-09-20 NOTE — Assessment & Plan Note (Signed)
Uncontroled, non compliant, needs endo treatment

## 2017-09-22 NOTE — Progress Notes (Signed)
Cardiology Office Note   Date:  09/23/2017   ID:  Antonio Cobb, DOB 1961/06/30, MRN [id number]  PCP:  [name], MD  Cardiologist:  [name]  Chief Complaint  Patient presents with  . Hospitalization Follow-up  . Coronary Artery Disease  . Hypertension     History of Present Illness: Antonio Cobb is a 56 y.o. male who presents for post hospital follow up and ongoing assessment and management of CAD, DES to LCX, patent stent per cath in 0814, chronic systolic CHF,poorly controlled diabetes, Hyperlipidemia, and HTN.  He was admitted for acute systolic CHF, AKI, and hyperglycemia. Diuresed over 6 liters with discharge weight of 203 lbs. He was discharged on lasix 40 mg in am and 20 mg in pm.   He has been doing well since discharge. He has lost another 3 lbs. His blood sugar is improved. His breathing status is better as well. He is walking daily. He is very careful about salt intake, stating that he is paranoid about it now and does not want to go back to the hospital.   Past Medical History:  Diagnosis Date  . Alcohol abuse   . CAD (coronary artery disease)    DES to circumflex June 2013  . Cardiomyopathy (HCC)    LVEF 30-35%  . COPD (chronic obstructive pulmonary disease) ([name])   . Diabetes mellitus, type 2 ([name])   . Essential hypertension   . Mitral regurgitation    Moderate  . Mixed hyperlipidemia   . Myocardial infarction ([name]) 05/23/2012   Inferolateral STEMI  . Obesity     Past Surgical History:  Procedure Laterality Date  . CARDIAC CATHETERIZATION  2 yrs ago  . CARDIAC CATHETERIZATION N/A 05/15/2016   Procedure: Right/Left Heart Cath and Coronary Angiography;  Surgeon: [name], MD;  Location: [facility];  Service: Cardiovascular;  Laterality: N/A;  . CORONARY STENT PLACEMENT  05/23/12  . LEFT HEART CATHETERIZATION WITH CORONARY ANGIOGRAM N/A 05/23/2012   Procedure: LEFT HEART CATHETERIZATION WITH CORONARY ANGIOGRAM;   Surgeon: [name], MD;  Location: [facility] C[facility];  Service: Cardiovascular;  Laterality: N/A;  . None    . PERCUTANEOUS CORONARY STENT INTERVENTION (PCI-S) N/A 05/23/2012   Procedure: PERCUTANEOUS CORONARY STENT INTERVENTION (PCI-S);  Surgeon: [name], MD;  Location: [facility] CATH LAB;  Service: Cardiovascular;  Laterality: N/A;  . POLYPECTOMY  10/16/2011   Procedure: POLYPECTOMY;  Surgeon: [name], MD;  Location: [facility];  Service: Endoscopy;;  Polypoid Lesion, Transverse and Sigmoid Colon     Current Outpatient Prescriptions  Medication Sig Dispense Refill  . albuterol (PROVENTIL HFA;VENTOLIN HFA) 108 (90 Base) MCG/ACT inhaler Inhale 2 puffs into the lungs every 6 (six) hours as needed for wheezing or shortness of breath. 18 g 0  . amLODipine (NORVASC) 10 MG tablet Take 1 tablet (10 mg total) by mouth daily. 90 tablet 1  . aspirin (ASPIRIN ADULT LOW STRENGTH) 81 MG EC tablet Take 81 mg by mouth daily.      [name] atorvastatin (LIPITOR) 40 MG tablet Take 1 tablet (40 mg total) by mouth daily. 90 tablet 1  . budesonide-formoterol (SYMBICORT) 80-4.5 MCG/ACT inhaler Inhale 2 puffs into the lungs 2 (two) times daily. 1 Inhaler 3  . docusate sodium (COLACE) 100 MG capsule Take 100 mg by mouth 2 (two) times daily.    [name] FREESTYLE LITE test strip TEST four times a day 100 each 6  . furosemide (LASIX) 40 MG tablet Take 1 tablet (  40 mg total) by mouth daily. 180 tablet 1  . insulin aspart (NOVOLOG) 100 UNIT/ML injection Inject 15 Units into the skin 3 (three) times daily before meals. Sliding scale begins at 15 units and increases as CBG dictates 10 mL 0  . Insulin Glargine (LANTUS SOLOSTAR) 100 UNIT/ML Solostar Pen Inject 60 Units into the skin daily at 10 pm. Everynight    . ipratropium-albuterol (DUONEB) 0.5-2.5 (3) MG/3ML SOLN Take 3 mLs by nebulization every 6 (six) hours as needed. (Patient taking differently: Take 3 mLs by nebulization every 6 (six) hours as needed (for wheezing or shortness of  breath). ) 360 mL 0  . Lancets (FREESTYLE) lancets Four times daily testing dx e11.65 150 each 1  . losartan (COZAAR) 25 MG tablet Take 12.5 mg by mouth daily.    . methocarbamol (ROBAXIN) 500 MG tablet Take 1 tablet (500 mg total) by mouth 3 (three) times daily. 21 tablet 0  . metoprolol succinate (TOPROL-XL) 50 MG 24 hr tablet take 1 tablet once daily with food 90 tablet 1  . Multiple Vitamin (MULTIVITAMIN) capsule Take 1 capsule by mouth daily.    . nitroGLYCERIN (NITROSTAT) 0.4 MG SL tablet Place 1 tablet (0.4 mg total) under the tongue every 5 (five) minutes as needed for chest pain. 25 tablet 3  . potassium chloride SA (KLOR-CON M20) 20 MEQ tablet Take 1 tablet (20 mEq total) by mouth daily. 90 tablet 1  . spironolactone (ALDACTONE) 25 MG tablet Take 1 tablet (25 mg total) by mouth daily. 90 tablet 1  . temazepam (RESTORIL) 7.5 MG capsule take 1 capsule by mouth at bedtime if needed 30 capsule 3  . tiotropium (SPIRIVA HANDIHALER) 18 MCG inhalation capsule inhale the contents of one capsule in the handihaler once daily 90 capsule 1   No current facility-administered medications for this visit.     Allergies:   Patient has no known allergies.    Social History:  The patient  reports that he quit smoking about 16 months ago. His smoking use included Cigars. He has a 7.50 pack-year smoking history. He has never used smokeless tobacco. He reports that he drinks alcohol. He reports that he does not use drugs.   Family History:  The patient's family history includes Asthma in his sister; COPD in his father and mother; Diabetes in his mother; Emphysema in his father and mother; Heart attack in his mother; Heart disease in his mother; Hypertension in his mother; Stroke in his sister.    ROS: All other systems are reviewed and negative. Unless otherwise mentioned in H&P    PHYSICAL EXAM: VS:  Ht 5\' 9"  (1.753 m)   Wt 207 lb (93.9 kg)   BMI 30.57 kg/m  , BMI Body mass index is 30.57  kg/m. GEN: Well nourished, well developed, in no acute distress  HEENT: normal  Neck: no JVD, carotid bruits, or masses Cardiac: RRR; no murmurs, rubs, or gallops,no edema  Respiratory: Clear to auscultation bilaterally, normal work of breathing GI: soft, nontender, nondistended, + BS MS: no deformity or atrophy  Skin: warm and dry, no rash Neuro:  Strength and sensation are intact Psych: euthymic mood, full affect   Recent Labs: 09/06/2017: B Natriuretic Peptide 376.0 09/07/2017: ALT 25 09/09/2017: BUN 31; Creatinine, Ser 1.12; Hemoglobin 13.9; Platelets 231; Potassium 3.5; Sodium 137    Lipid Panel    Component Value Date/Time   CHOL 140 02/09/2017 1201   TRIG 133 02/09/2017 1201   HDL 26 (L) 02/09/2017 1201  CHOLHDL 5.4 (H) 02/09/2017 1201   VLDL 27 02/09/2017 1201   LDLCALC 87 02/09/2017 1201   LDLDIRECT TEST NOT PERFORMED 06/19/2011 1230      Wt Readings from Last 3 Encounters:  09/23/17 207 lb (93.9 kg)  09/14/17 211 lb 12 oz (96 kg)  09/09/17 203 lb 1.6 oz (92.1 kg)      Other studies Reviewed: Echocardiogram Left ventricle: The cavity size was moderately dilated. Wall   thickness was increased in a pattern of mild LVH. Systolic   function was severely reduced. The estimated ejection fraction   was in the range of 25% to 30%. Diffuse hypokinesis. Features are   consistent with a pseudonormal left ventricular filling pattern,   with concomitant abnormal relaxation and increased filling   pressure (grade 2 diastolic dysfunction). Doppler parameters are   consistent with high ventricular filling pressure. - Regional wall motion abnormality: Akinesis of the mid inferior,   basal-mid inferolateral, and apical lateral myocardium; severe   hypokinesis of the basal inferior myocardium; moderate   hypokinesis of the mid anterolateral myocardium. - Aortic valve: There was trivial regurgitation. - Aorta: Mild aortic root dilatation. - Mitral valve: Calcified annulus.  Mildly thickened leaflets .   There was moderate, posteriorly directed regurgitation. - Left atrium: The atrium was severely dilated. - Atrial septum: The septum bowed from left to right, consistent   with increased left atrial pressure. - Tricuspid valve: There was mild regurgitation. - Pulmonary arteries: PA peak pressure: 34 mm Hg (S).  Cardiac Catheterization 05/15/2016 Conclusion    Patent stent in the mid circumflex.  Moderate disease in the RCA and LAD.  Mild pulmonary hypertension.  Cardiac output 5.1 L/m. Cardiac index 2.4  Pulmonary artery saturation 66%.   Cardiomyopathy not related to obstructive coronary artery disease. The patient did state that he drinks alcohol heavily. I did explain him the cutting back on his alcohol may help his cardiac function. Follow-up with Dr. Harl Bowie.     ASSESSMENT AND PLAN:  1.  Chronic systolic CHF: He is doing well symptomatically, but needs more of a routine time to take his medications. He just took them before coming today, and sometimes takes them earlier or later each day. I have asked him to take the medications daily at the same time. He does not appear to be volume overloaded at present.   2. Hypertension: BP is not optimal for current LV fx. He just took his medications. I have explained to him that his heart function is weak, and will need a lower BP for now. He is to take his BP daily and record. I have given him a recording sheet to take home and bring back with him for next appointment. He can have BP 98'X to 211 systolic. He is reassured that his is best for him to have lower BP for now. He verbalizes understanding.   3. CAD: S/P cardiac cath in 05/2016. Non-obstructive disease. He will continue medical management and primary prevention.  4. ETOH abuse: In remission for now. Significantly contributed to cardiomyopathy.   5. Diabetes: Followed by Dr. Dorris Fetch.    Current medicines are reviewed at length with the patient today.     Labs/ tests ordered today include: BMET Phill Myron. [place] Pugh, ANP, AACC   09/23/2017 1:18 PM    [facility] [address] [place], [place] [address] Phone: [phone]; Fax: [phone]

## 2017-09-23 ENCOUNTER — Encounter: Payer: Self-pay | Admitting: Adult Health

## 2017-09-23 ENCOUNTER — Ambulatory Visit (INDEPENDENT_AMBULATORY_CARE_PROVIDER_SITE_OTHER): Payer: BLUE CROSS/BLUE SHIELD | Admitting: Adult Health

## 2017-09-23 VITALS — BP 130/86 | HR 80 | Ht 69.0 in | Wt 207.0 lb

## 2017-09-23 DIAGNOSIS — I2510 Atherosclerotic heart disease of native coronary artery without angina pectoris: Secondary | ICD-10-CM

## 2017-09-23 DIAGNOSIS — I10 Essential (primary) hypertension: Secondary | ICD-10-CM

## 2017-09-23 DIAGNOSIS — I5023 Acute on chronic systolic (congestive) heart failure: Secondary | ICD-10-CM

## 2017-09-23 NOTE — Patient Instructions (Signed)
Medication Instructions:  Your physician recommends that you continue on your current medications as directed. Please refer to the Current Medication list given to you today.   Labwork: Your physician recommends that you return for lab work just before your next visit.    Testing/Procedures: NONE   Follow-Up: Your physician recommends that you schedule a follow-up appointment in: 1 Month    Any Other Special Instructions Will Be Listed Below (If Applicable).  Your physician has requested that you regularly monitor and record your blood pressure readings at home. Please use the same machine at the same time of day to check your readings and record them to bring to your follow-up visit.  Keep between 45/50 and 105/60    If you need a refill on your cardiac medications before your next appointment, please call your pharmacy. Thank you for choosing [facility]!

## 2017-10-07 ENCOUNTER — Ambulatory Visit: Payer: Self-pay | Admitting: "Endocrinology

## 2017-10-18 ENCOUNTER — Ambulatory Visit: Payer: BLUE CROSS/BLUE SHIELD | Admitting: "Endocrinology

## 2017-10-18 ENCOUNTER — Encounter: Payer: Self-pay | Admitting: "Endocrinology

## 2017-10-18 VITALS — BP 117/78 | HR 74 | Ht 69.0 in | Wt 203.0 lb

## 2017-10-18 DIAGNOSIS — Z91199 Patient's noncompliance with other medical treatment and regimen due to unspecified reason: Secondary | ICD-10-CM | POA: Insufficient documentation

## 2017-10-18 DIAGNOSIS — Z9119 Patient's noncompliance with other medical treatment and regimen: Secondary | ICD-10-CM

## 2017-10-18 DIAGNOSIS — E1159 Type 2 diabetes mellitus with other circulatory complications: Secondary | ICD-10-CM | POA: Diagnosis not present

## 2017-10-18 DIAGNOSIS — I10 Essential (primary) hypertension: Secondary | ICD-10-CM | POA: Diagnosis not present

## 2017-10-18 DIAGNOSIS — E782 Mixed hyperlipidemia: Secondary | ICD-10-CM

## 2017-10-18 NOTE — Progress Notes (Signed)
Consult Note       10/18/2017, 3:45 PM   Subjective:    Patient ID: Antonio Cobb, male    DOB: Jun 04, 1961.  he is being seen in consultation for management of currently uncontrolled symptomatic diabetes requested by  [name], MD.   Past Medical History:  Diagnosis Date  . Alcohol abuse   . CAD (coronary artery disease)    DES to circumflex June 2013  . Cardiomyopathy (HCC)    LVEF 30-35%  . COPD (chronic obstructive pulmonary disease) ([place])   . Diabetes mellitus, type 2 ([place])   . Essential hypertension   . Mitral regurgitation    Moderate  . Mixed hyperlipidemia   . Myocardial infarction ([place]) 05/23/2012   Inferolateral STEMI  . Obesity    Past Surgical History:  Procedure Laterality Date  . CARDIAC CATHETERIZATION  2 yrs ago  . CORONARY STENT PLACEMENT  05/23/12  . None     Social History   Socioeconomic History  . Marital status: Married    Spouse name: None  . Number of children: None  . Years of education: None  . Highest education level: None  Social Needs  . Financial resource strain: None  . Food insecurity - worry: None  . Food insecurity - inability: None  . Transportation needs - medical: None  . Transportation needs - non-medical: None  Occupational History  . Occupation: Therapist, sports: [employer]  Tobacco Use  . Smoking status: Former Smoker    Packs/day: 0.25    Years: 30.00    Pack years: 7.50    Types: Cigars    Last attempt to quit: 05/23/2016    Years since quitting: 1.4  . Smokeless tobacco: Never Used  . Tobacco comment: since early 58s  Substance and Sexual Activity  . Alcohol use: Yes    Alcohol/week: 0.0 oz    Comment: occ  . Drug use: No  . Sexual activity: None  Other Topics Concern  . None  Social History Narrative  . None   Outpatient Encounter Medications as of 10/18/2017  Medication Sig  . insulin regular  (NOVOLIN R,HUMULIN R) 100 units/mL injection Inject 10 Units 3 (three) times daily before meals into the skin.  [name] albuterol (PROVENTIL HFA;VENTOLIN HFA) 108 (90 Base) MCG/ACT inhaler Inhale 2 puffs into the lungs every 6 (six) hours as needed for wheezing or shortness of breath.  [name] amLODipine (NORVASC) 10 MG tablet Take 1 tablet (10 mg total) by mouth daily.  [name] aspirin (ASPIRIN ADULT LOW STRENGTH) 81 MG EC tablet Take 81 mg by mouth daily.    [name] atorvastatin (LIPITOR) 40 MG tablet Take 1 tablet (40 mg total) by mouth daily.  . budesonide-formoterol (SYMBICORT) 80-4.5 MCG/ACT inhaler Inhale 2 puffs into the lungs 2 (two) times daily.  [name] docusate sodium (COLACE) 100 MG capsule Take 100 mg by mouth 2 (two) times daily.  [name] FREESTYLE LITE test strip TEST four times a day  . furosemide (LASIX) 40 MG tablet Take 40 mg by mouth 2 (two) times daily.  . Insulin Glargine (LANTUS SOLOSTAR) 100 UNIT/ML Solostar  Pen Inject 50 Units daily at 10 pm into the skin. Everynight   . ipratropium-albuterol (DUONEB) 0.5-2.5 (3) MG/3ML SOLN Take 3 mLs by nebulization every 6 (six) hours as needed. (Patient taking differently: Take 3 mLs by nebulization every 6 (six) hours as needed (for wheezing or shortness of breath). )  . Lancets (FREESTYLE) lancets Four times daily testing dx e11.65  . losartan (COZAAR) 25 MG tablet Take 12.5 mg by mouth daily.  . methocarbamol (ROBAXIN) 500 MG tablet Take 1 tablet (500 mg total) by mouth 3 (three) times daily.  . metoprolol succinate (TOPROL-XL) 50 MG 24 hr tablet take 1 tablet once daily with food  . Multiple Vitamin (MULTIVITAMIN) capsule Take 1 capsule by mouth daily.  . nitroGLYCERIN (NITROSTAT) 0.4 MG SL tablet Place 1 tablet (0.4 mg total) under the tongue every 5 (five) minutes as needed for chest pain.  . potassium chloride SA (KLOR-CON M20) 20 MEQ tablet Take 1 tablet (20 mEq total) by mouth daily.  [name] spironolactone (ALDACTONE) 25 MG tablet Take 1 tablet (25 mg total) by mouth  daily.  . temazepam (RESTORIL) 7.5 MG capsule take 1 capsule by mouth at bedtime if needed  . tiotropium (SPIRIVA HANDIHALER) 18 MCG inhalation capsule inhale the contents of one capsule in the handihaler once daily  . [DISCONTINUED] insulin aspart (NOVOLOG) 100 UNIT/ML injection Inject 15 Units into the skin 3 (three) times daily before meals. Sliding scale begins at 15 units and increases as CBG dictates (Patient not taking: Reported on 10/18/2017)   No facility-administered encounter medications on file as of 10/18/2017.     ALLERGIES: No Known Allergies  VACCINATION STATUS: Immunization History  Administered Date(s) Administered  . Influenza Split 11/11/2011, 09/07/2012  . Influenza,inj,Quad PF,6+ Mos 03/12/2016, 10/08/2016, 09/14/2017  . Pneumococcal Polysaccharide-23 08/13/2011, 10/08/2016  . Td 07/01/2004  . Tdap 07/05/2014    Diabetes  He presents for his initial diabetic visit. He has type 2 diabetes mellitus. Onset time: She was diagnosed at approximate age of 24 years. His disease course has been worsening (Patient was seen prior to 3 years ago in this clinic, no showed for unclear reasons. He did have a history of noncompliance/nonadherence.). There are no hypoglycemic associated symptoms. Pertinent negatives for hypoglycemia include no confusion, headaches, pallor or seizures. Associated symptoms include blurred vision, polydipsia and polyuria. Pertinent negatives for diabetes include no chest pain, no fatigue, no polyphagia and no weakness. There are no hypoglycemic complications. Symptoms are worsening. Diabetic complications include heart disease, impotence and retinopathy. Risk factors for coronary artery disease include diabetes mellitus, dyslipidemia, hypertension, male sex, sedentary lifestyle and tobacco exposure. Current diabetic treatment includes insulin injections. His weight is decreasing steadily. He is following a generally unhealthy diet. When asked about meal  planning, he reported none. He has not had a previous visit with a dietitian. He never participates in exercise. (Recent A1c from September 2018 was high at 10.8%. He registers 18 readings in the last 7 days averaging 135.) An ACE inhibitor/angiotensin II receptor blocker is being taken. He does not see a podiatrist.Eye exam is current.  Hyperlipidemia  This is a chronic problem. The current episode started more than 1 year ago. Exacerbating diseases include diabetes. Pertinent negatives include no chest pain, myalgias or shortness of breath. Current antihyperlipidemic treatment includes statins. Risk factors for coronary artery disease include diabetes mellitus, dyslipidemia, hypertension and a sedentary lifestyle.  Hypertension  This is a chronic problem. The current episode started more than 1 year ago. Associated symptoms  include blurred vision. Pertinent negatives include no chest pain, headaches, neck pain, palpitations or shortness of breath. Risk factors for coronary artery disease include dyslipidemia, diabetes mellitus, sedentary lifestyle and smoking/tobacco exposure. Past treatments include ACE inhibitors. Hypertensive end-organ damage includes retinopathy.     Review of Systems  Constitutional: Negative for chills, fatigue, fever and unexpected weight change.  HENT: Negative for dental problem, mouth sores and trouble swallowing.   Eyes: Positive for blurred vision. Negative for visual disturbance.  Respiratory: Negative for cough, choking, chest tightness, shortness of breath and wheezing.   Cardiovascular: Negative for chest pain, palpitations and leg swelling.  Gastrointestinal: Negative for abdominal distention, abdominal pain, constipation, diarrhea, nausea and vomiting.  Endocrine: Positive for polydipsia and polyuria. Negative for polyphagia.  Genitourinary: Positive for impotence. Negative for dysuria, flank pain, hematuria and urgency.  Musculoskeletal: Negative for back pain,  gait problem, myalgias and neck pain.  Skin: Negative for pallor, rash and wound.  Neurological: Negative for seizures, syncope, weakness, numbness and headaches.  Psychiatric/Behavioral: Negative for confusion and dysphoric mood.    Objective:    BP 117/78 (BP Location: Right Arm, Patient Position: Sitting, Cuff Size: Large)   Pulse 74   Ht 5\' 9"  (1.753 m)   Wt 203 lb (92.1 kg)   BMI 29.98 kg/m   Wt Readings from Last 3 Encounters:  10/18/17 203 lb (92.1 kg)  09/23/17 207 lb (93.9 kg)  09/14/17 211 lb 12 oz (96 kg)     Physical Exam  Constitutional: He is oriented to person, place, and time. He appears well-developed. He is cooperative. No distress.  HENT:  Head: Normocephalic and atraumatic.  Eyes: EOM are normal.  Neck: Normal range of motion. Neck supple. No tracheal deviation present. No thyromegaly present.  Cardiovascular: Normal rate, S1 normal, S2 normal and normal heart sounds. Exam reveals no gallop.  No murmur heard. Pulses:      Dorsalis pedis pulses are 1+ on the right side, and 1+ on the left side.       Posterior tibial pulses are 1+ on the right side, and 1+ on the left side.  Pulmonary/Chest: Breath sounds normal. No respiratory distress. He has no wheezes.  Abdominal: Soft. Bowel sounds are normal. He exhibits no distension. There is no tenderness. There is no guarding and no CVA tenderness.  Musculoskeletal: He exhibits no edema.       Right shoulder: He exhibits no swelling and no deformity.  Neurological: He is alert and oriented to person, place, and time. He has normal strength and normal reflexes. No cranial nerve deficit or sensory deficit. Gait normal.  Skin: Skin is warm and dry. No rash noted. No cyanosis. Nails show no clubbing.  Psychiatric: His speech is normal. Cognition and memory are normal.  Still reluctant, unconcerned affect.   CMP ( most recent) CMP     Component Value Date/Time   NA 137 09/09/2017 0434   K 3.5 09/09/2017 0434   CL  101 09/09/2017 0434   CO2 27 09/09/2017 0434   GLUCOSE 93 09/09/2017 0434   BUN 31 (H) 09/09/2017 0434   CREATININE 1.12 09/09/2017 0434   CREATININE 1.01 02/09/2017 1201   CALCIUM 8.9 09/09/2017 0434   PROT 7.4 09/07/2017 0424   ALBUMIN 3.5 09/07/2017 0424   AST 20 09/07/2017 0424   ALT 25 09/07/2017 0424   ALKPHOS 73 09/07/2017 0424   BILITOT 1.1 09/07/2017 0424   GFRNONAA >60 09/09/2017 0434   GFRNONAA 83 02/09/2017 1201  GFRAA >60 09/09/2017 0434   GFRAA >89 02/09/2017 1201     Diabetic Labs (most recent): Lab Results  Component Value Date   HGBA1C 10.8 (H) 09/08/2017   HGBA1C 10.4 (H) 02/09/2017   HGBA1C 10.6 (H) 06/17/2016     Lipid Panel ( most recent) Lipid Panel     Component Value Date/Time   CHOL 140 02/09/2017 1201   TRIG 133 02/09/2017 1201   HDL 26 (L) 02/09/2017 1201   CHOLHDL 5.4 (H) 02/09/2017 1201   VLDL 27 02/09/2017 1201   LDLCALC 87 02/09/2017 1201   LDLDIRECT TEST NOT PERFORMED 06/19/2011 1230      Lab Results  Component Value Date   TSH 1.38 03/12/2016   TSH 1.789 12/30/2012   TSH 2.849 11/11/2011   TSH 1.597 12/11/2009      Assessment & Plan:   1. DM type 2 causing vascular disease ([place])  - Patient has currently uncontrolled symptomatic type 2 DM since  56 years of age,  with most recent A1c of 10.8 %. Recent labs reviewed.  -his diabetes is complicated by coronary artery disease, noncompliance/nonadherence, and Efren A Bartolo remains at a high risk for more acute and chronic complications which include CAD, CVA, CKD, retinopathy, and neuropathy. These are all discussed in detail with the patient.  - I have counseled him on diet management  by adopting a carbohydrate restricted/protein rich diet.  - Suggestion is made for him to avoid simple carbohydrates  from his diet including Cakes, Sweet Desserts, Ice Cream, Soda (diet and regular), Sweet Tea, Candies, Chips, Cookies, Store Bought Juices, Alcohol in Excess of  1-2 drinks a  day, Artificial Sweeteners, and "Sugar-free" Products. This will help patient to have stable blood glucose profile and potentially avoid unintended weight gain.  - I encouraged him to switch to  unprocessed or minimally processed complex starch and increased protein intake (animal or plant source), fruits, and vegetables.  - he is advised to stick to a routine mealtimes to eat 3 meals  a day and avoid unnecessary snacks ( to snack only to correct hypoglycemia).   - he will be scheduled with Jearld Fenton, RDN, CDE for individualized diabetes education.  - I have approached him with the following individualized plan to manage diabetes and patient agrees:   - She remains on regular insulin due to cost reasons. - He'll continue to require intensive treatment with basal/bolus insulin. - I  will proceed to readjust his basal insulin Lantus to 50 units daily at bedtime , and  increase his prandial insulin Novolin R to 10  units 3 times a day with meals  for pre-meal BG readings of 90-150mg /dl, plus patient specific correction dose for unexpected hyperglycemia above 150mg /dl, associated with strict monitoring of glucose 4 times a day-before meals and at bedtime. - Patient is warned not to take insulin without proper monitoring per orders. -Adjustment parameters are given for hypo and hyperglycemia in writing. -Patient is encouraged to call clinic for blood glucose levels less than 70 or above 300 mg /dl. - She does not tolerate metformin.  -Patient is not a candidate for  SGLT2 SGLT2 inhibitors due to CKD. - She is the last time he was here, he has quit alcohol abuse, which will be very helpful going forward.  - Patient specific target  A1c;  LDL, HDL, Triglycerides, and  Waist Circumference were discussed in detail.  2) BP/HTN:  controlled. Continue current medications including ACEI/ARB. 3) Lipids/HPL:   Controlled, LDL at  87.   Patient is advised to continue statins. 4)  Weight/Diet: CDE Consult  will be initiated , exercise, and detailed carbohydrates information provided.  5) Chronic Care/Health Maintenance:  -he  is on ACEI/ARB and Statin medications and  is encouraged to continue to follow up with Ophthalmology, Dentist,  Podiatrist at least yearly or according to recommendations, and advised to  stay away from smoking. I have recommended yearly flu vaccine and pneumonia vaccination at least every 5 years; moderate intensity exercise for up to 150 minutes weekly; and  sleep for at least 7 hours a day.  - Time spent with the patient: 1 hour, of which >50% was spent in obtaining information about his symptoms, reviewing his previous labs, evaluations, and treatments, counseling him about his   currently complicated uncontrolled type 2 diabetes, hyperlipidemia, hypertension, and developing a plan for long term treatment; his  questions were answered to his satisfaction.  - Patient to bring meter and  blood glucose logs during his next visit.  - I advised patient to maintain close follow up with [name], MD for primary care needs.  Follow up plan: - Return in about 1 week (around 10/25/2017) for follow up with meter and logs- no labs.  Glade Lloyd, MD [facility] [address] [place], [place] [address] Phone: [phone]  Fax: [phone]    10/18/2017, 3:45 PM  This note was partially dictated with voice recognition software. Similar sounding words can be transcribed inadequately or may not  be corrected upon review.

## 2017-10-18 NOTE — Patient Instructions (Signed)

## 2017-10-19 ENCOUNTER — Encounter: Payer: Self-pay | Admitting: Cardiology

## 2017-10-19 ENCOUNTER — Ambulatory Visit: Payer: BLUE CROSS/BLUE SHIELD | Admitting: Cardiology

## 2017-10-19 VITALS — BP 106/70 | HR 80 | Ht 69.0 in | Wt 204.0 lb

## 2017-10-19 DIAGNOSIS — I5022 Chronic systolic (congestive) heart failure: Secondary | ICD-10-CM

## 2017-10-19 DIAGNOSIS — I10 Essential (primary) hypertension: Secondary | ICD-10-CM | POA: Diagnosis not present

## 2017-10-19 DIAGNOSIS — E782 Mixed hyperlipidemia: Secondary | ICD-10-CM | POA: Diagnosis not present

## 2017-10-19 DIAGNOSIS — I2510 Atherosclerotic heart disease of native coronary artery without angina pectoris: Secondary | ICD-10-CM | POA: Diagnosis not present

## 2017-10-19 DIAGNOSIS — Z7189 Other specified counseling: Secondary | ICD-10-CM

## 2017-10-19 NOTE — Patient Instructions (Signed)
Medication Instructions:  Your physician recommends that you continue on your current medications as directed. Please refer to the Current Medication list given to you today.   Labwork: none Testing/Procedures: none  Follow-Up: Your physician recommends that you schedule a follow-up appointment in: 3 months.    Any Other Special Instructions Will Be Listed Below (If Applicable).  You have been referred to DR. Lovena Le.     If you need a refill on your cardiac medications before your next appointment, please call your pharmacy.

## 2017-10-19 NOTE — Progress Notes (Signed)
Clinical Summary Mr. Hyneman is a 56 y.o.male seen today for follow up of the following medical problems.   1. CAD - hx of inferolateral STEMI June 2013, received DES to LCX. LVEF at that time 55%.  - echo 04/2016 LVEF 30-35% - 05/2016 cath patent LCX stent, moderate RCA and LAD disease.   - no recent chest pain since last visit. No significant SOB/DOE  2. Chronic systolic HF - drop in LVEF noted by echo 04/2016, cath at that time did not show any new significant obstructive CAD - 09/2016 echo LVEF 30-35%, moderate MR.   - entresto caused cough and dizzy spells, now just on losartan.  - 08/2017 echo LVEF 25-30%, grade II diastolic dysfunction.  - admit 08/2017 with fluid overload. Diuresed 6L, discharge weight 203 lbs.  - no recent SOB/DOE. No recent edema.  - home weights around stable around 200 - compliant with meds  2. Hyperlipidemia - compliant with statin. Stopped statin for a period of time, now back on  for a few weeks.  01/2017 TC 140 TG 133 HDL 26 LDL 87  3. HTN - he is compliant with meds  4. COPD - followed by pcp and Dr Luan Pulling - recent 06/2017 PFTs mild to moderate ventilaroty defect  5. OSA screen +snoring, no apneic episodes, can have some day time somnolence.  - reports sleep study 5 years ago at [facility], reports it was normal.   6. Mitral regurgitation - moderate by echo 08/2017 - no recent symptoms Past Medical History:  Diagnosis Date  . Alcohol abuse   . CAD (coronary artery disease)    DES to circumflex June 2013  . Cardiomyopathy (HCC)    LVEF 30-35%  . COPD (chronic obstructive pulmonary disease) ([place])   . Diabetes mellitus, type 2 ([place])   . Essential hypertension   . Mitral regurgitation    Moderate  . Mixed hyperlipidemia   . Myocardial infarction ([place]) 05/23/2012   Inferolateral STEMI  . Obesity      No Known Allergies   Current Outpatient Medications  Medication Sig Dispense Refill  . albuterol (PROVENTIL  HFA;VENTOLIN HFA) 108 (90 Base) MCG/ACT inhaler Inhale 2 puffs into the lungs every 6 (six) hours as needed for wheezing or shortness of breath. 18 g 0  . amLODipine (NORVASC) 10 MG tablet Take 1 tablet (10 mg total) by mouth daily. 90 tablet 1  . aspirin (ASPIRIN ADULT LOW STRENGTH) 81 MG EC tablet Take 81 mg by mouth daily.      [name] atorvastatin (LIPITOR) 40 MG tablet Take 1 tablet (40 mg total) by mouth daily. 90 tablet 1  . budesonide-formoterol (SYMBICORT) 80-4.5 MCG/ACT inhaler Inhale 2 puffs into the lungs 2 (two) times daily. 1 Inhaler 3  . docusate sodium (COLACE) 100 MG capsule Take 100 mg by mouth 2 (two) times daily.    [name] FREESTYLE LITE test strip TEST four times a day 100 each 6  . furosemide (LASIX) 40 MG tablet Take 40 mg by mouth 2 (two) times daily.    . Insulin Glargine (LANTUS SOLOSTAR) 100 UNIT/ML Solostar Pen Inject 50 Units daily at 10 pm into the skin. Everynight     . insulin regular (NOVOLIN R,HUMULIN R) 100 units/mL injection Inject 10 Units 3 (three) times daily before meals into the skin.    [name] ipratropium-albuterol (DUONEB) 0.5-2.5 (3) MG/3ML SOLN Take 3 mLs by nebulization every 6 (six) hours as needed. (Patient taking differently: Take 3 mLs by nebulization  every 6 (six) hours as needed (for wheezing or shortness of breath). ) 360 mL 0  . Lancets (FREESTYLE) lancets Four times daily testing dx e11.65 150 each 1  . losartan (COZAAR) 25 MG tablet Take 12.5 mg by mouth daily.    . methocarbamol (ROBAXIN) 500 MG tablet Take 1 tablet (500 mg total) by mouth 3 (three) times daily. 21 tablet 0  . metoprolol succinate (TOPROL-XL) 50 MG 24 hr tablet take 1 tablet once daily with food 90 tablet 1  . Multiple Vitamin (MULTIVITAMIN) capsule Take 1 capsule by mouth daily.    . nitroGLYCERIN (NITROSTAT) 0.4 MG SL tablet Place 1 tablet (0.4 mg total) under the tongue every 5 (five) minutes as needed for chest pain. 25 tablet 3  . potassium chloride SA (KLOR-CON M20) 20 MEQ tablet Take 1  tablet (20 mEq total) by mouth daily. 90 tablet 1  . spironolactone (ALDACTONE) 25 MG tablet Take 1 tablet (25 mg total) by mouth daily. 90 tablet 1  . temazepam (RESTORIL) 7.5 MG capsule take 1 capsule by mouth at bedtime if needed 30 capsule 3  . tiotropium (SPIRIVA HANDIHALER) 18 MCG inhalation capsule inhale the contents of one capsule in the handihaler once daily 90 capsule 1   No current facility-administered medications for this visit.      Past Surgical History:  Procedure Laterality Date  . CARDIAC CATHETERIZATION  2 yrs ago  . CORONARY STENT PLACEMENT  05/23/12  . None       No Known Allergies    Family History  Problem Relation Age of Onset  . Emphysema Mother   . Heart attack Mother   . Hypertension Mother   . Diabetes Mother   . Heart disease Mother   . COPD Mother   . Emphysema Father   . COPD Father   . Stroke Sister   . Asthma Sister   . Colon cancer Neg Hx   . Anesthesia problems Neg Hx   . Hypotension Neg Hx   . Malignant hyperthermia Neg Hx   . Pseudochol deficiency Neg Hx      Social History Mr. Hamada reports that he quit smoking about 16 months ago. His smoking use included cigars. He has a 7.50 pack-year smoking history. he has never used smokeless tobacco. Mr. Kloos reports that he drinks alcohol.   Review of Systems CONSTITUTIONAL: No weight loss, fever, chills, weakness or fatigue.  HEENT: Eyes: No visual loss, blurred vision, double vision or yellow sclerae.No hearing loss, sneezing, congestion, runny nose or sore throat.  SKIN: No rash or itching.  CARDIOVASCULAR: per hpi RESPIRATORY: per hpi GASTROINTESTINAL: No anorexia, nausea, vomiting or diarrhea. No abdominal pain or blood.  GENITOURINARY: No burning on urination, no polyuria NEUROLOGICAL: No headache, dizziness, syncope, paralysis, ataxia, numbness or tingling in the extremities. No change in bowel or bladder control.  MUSCULOSKELETAL: No muscle, back pain, joint pain or  stiffness.  LYMPHATICS: No enlarged nodes. No history of splenectomy.  PSYCHIATRIC: No history of depression or anxiety.  ENDOCRINOLOGIC: No reports of sweating, cold or heat intolerance. No polyuria or polydipsia.  [name]   Physical Examination Vitals:   10/19/17 1559  BP: 106/70  Pulse: 80  SpO2: 97%   Vitals:   10/19/17 1559  Weight: 204 lb (92.5 kg)  Height: 5\' 9"  (1.753 m)    Gen: resting comfortably, no acute distress HEENT: no scleral icterus, pupils equal round and reactive, no palptable cervical adenopathy,  CV: RRR, no mrg, no jvd  Resp: Clear to auscultation bilaterally GI: abdomen is soft, non-tender, non-distended, normal bowel sounds, no hepatosplenomegaly MSK: extremities are warm, no edema.  Skin: warm, no rash Neuro:  no focal deficits Psych: appropriate affect   Diagnostic Studies  Cath 05/2012 [facility] FINDINGS  Hemodynamics:  AO 119/78  LV 121/71  Coronary angiography:  Coronary dominance: right  Left mainstem: The left main is patent with diffuse nonobstructive plaque. The distal left main has 30% stenosis.  Left anterior descending (LAD): the LAD is patent to the LV apex. The mid LAD has 50-60% stenosis at the origin of the second diagonal Olamide Carattini. The first diagonal is very large in caliber and has no significant obstructive disease.  Left circumflex (LCx): the left circumflex is totally occluded in the mid vessel. There is TIMI 0 flow. Following PCI, there were 2 obtuse marginals and a left posterolateral Medora Roorda visualized, all of which are patent.  Right coronary artery (RCA): there is a high anterior origin of the RCA. The mid vessel has diffuse plaque estimated at 50-60%. The vessel is dominant. There is diffuse nonobstructive disease throughout. There is a small PDA and small posterolateral Istvan Behar.  Left ventriculography: Left ventricular systolic function is in the low-normal range, LVEF is estimated at 55%, there is hypokinesis of the  basal and midinferior wall.  PCI Note: Following the diagnostic procedure, the decision was made to proceed with PCI. The patient was loaded with Effient 60 mg. Weight-based bivalirudin was given for anticoagulation. Once a therapeutic ACT was achieved, a 6 [place] XB-LAD guide catheter was inserted. A Cougar coronary guidewire was used to cross the lesion. The lesion was predilated with a 2.5x15 mm balloon. The lesion was then stented with a 3.5x15 mm Resolute drug-eluting stent. Following PCI, there was 0% residual stenosis and TIMI-3 flow. Final angiography confirmed an excellent result. The patient tolerated the procedure well. There were no immediate procedural complications. A TR band was used for radial hemostasis. The patient was transferred to the post catheterization recovery area for further monitoring.  PCI Data:  Vessel - circumflex /Segment - mid  Percent Stenosis (pre) 100  TIMI-flow 0  Stent 3.5 x 15 mm resolute integrity DES  Percent Stenosis (post) 0  TIMI-flow (post) 3  Final Conclusions:  1. Total occlusion of the left circumflex with successful primary PCI using a drug-eluting stent platform  2. Diffuse nonobstructive disease of the right coronary artery and LAD  3. Mild left ventricular dysfunction consistent with inferior MI  Recommendations:  transfer to ICU. Post MI medical therapy will be instituted. Tobacco cessation counseling will be done. Anticipate 48 hour hospitalization if his post MI course is uncomplicated.  09/2016 echo Study Conclusions  - Left ventricle: The cavity size was mildly dilated. Wall thickness was increased in a pattern of mild LVH. Systolic function was moderately to severely reduced. The estimated ejection fraction was in the range of 30% to 35%. There is akinesis of the inferolateral and inferior myocardium. Doppler parameters are consistent with restrictive physiology, indicative of decreased left ventricular  diastolic compliance and/or increased left atrial pressure. - Aortic valve: Mildly calcified annulus. Trileaflet. There was trivial regurgitation. - Mitral valve: Mildly thickened leaflets . There was moderate regurgitation directed posteriorly. - Left atrium: The atrium was mildly dilated. - Right atrium: Central venous pressure (est): 3 mm Hg. - Tricuspid valve: There was mild regurgitation. - Pulmonary arteries: PA peak pressure: 36 mm Hg (S). - Pericardium, extracardiac: There was no pericardial effusion.  Impressions:  -  Mild LVH with mild LV chamber dilatation and LVEF 30-35%. Inferior/inferolateral akinesis. Restrictive diastolic filling pattern noted. Mild left atrial enlargement. Mildly thickened mitral leaflets with moderate, posteriorly directed mitral regurgitation. Mildly calcified aortic annulus with trivial aortic regurgitation. Mild tricuspid regurgitation with PASP 36 mmHg.  05/2016 cath  Patent stent in the mid circumflex.  Moderate disease in the RCA and LAD.  Mild pulmonary hypertension.  Cardiac output 5.1 L/m. Cardiac index 2.4  Pulmonary artery saturation 66%.  Cardiomyopathy not related to obstructive coronary artery disease. The patient did state that he drinks alcohol heavily. I did explain him the cutting back on his alcohol may help his cardiac function. Follow-up with Dr. Harl Bowie.   06/2017 PFTs Mild to moderate ventilatory defect with small airway obstruction, reduced TLC, normal DLCO  08/2017 echo Study Conclusions  - Left ventricle: The cavity size was moderately dilated. Wall   thickness was increased in a pattern of mild LVH. Systolic   function was severely reduced. The estimated ejection fraction   was in the range of 25% to 30%. Diffuse hypokinesis. Features are   consistent with a pseudonormal left ventricular filling pattern,   with concomitant abnormal relaxation and increased filling   pressure (grade 2  diastolic dysfunction). Doppler parameters are   consistent with high ventricular filling pressure. - Regional wall motion abnormality: Akinesis of the mid inferior,   basal-mid inferolateral, and apical lateral myocardium; severe   hypokinesis of the basal inferior myocardium; moderate   hypokinesis of the mid anterolateral myocardium. - Aortic valve: There was trivial regurgitation. - Aorta: Mild aortic root dilatation. - Mitral valve: Calcified annulus. Mildly thickened leaflets .   There was moderate, posteriorly directed regurgitation. - Left atrium: The atrium was severely dilated. - Atrial septum: The septum bowed from left to right, consistent   with increased left atrial pressure. - Tricuspid valve: There was mild regurgitation. - Pulmonary arteries: PA peak pressure: 34 mm Hg (S).  Assessment and Plan  1. CAD - asymptomatic, continue current meds  2. Chronic systolic HF - appears euvolemic, he is at his baseline weight and asymptomatic - continue current meds - refer to EP for ICD consideration.   3. . Hyperlipidemia - he will continue statin  4. HTN -- at goal, continue current meds  F/u 3 months    Arnoldo Lenis, M.D.

## 2017-10-20 ENCOUNTER — Encounter: Payer: Self-pay | Admitting: Cardiology

## 2017-10-25 ENCOUNTER — Encounter: Payer: Self-pay | Admitting: "Endocrinology

## 2017-10-25 ENCOUNTER — Ambulatory Visit: Payer: BLUE CROSS/BLUE SHIELD | Admitting: "Endocrinology

## 2017-10-28 ENCOUNTER — Ambulatory Visit: Payer: BLUE CROSS/BLUE SHIELD | Admitting: Cardiology

## 2017-11-17 ENCOUNTER — Institutional Professional Consult (permissible substitution): Payer: BLUE CROSS/BLUE SHIELD | Admitting: Internal Medicine

## 2017-11-30 ENCOUNTER — Encounter: Payer: Self-pay | Admitting: Internal Medicine

## 2017-11-30 ENCOUNTER — Ambulatory Visit: Payer: BLUE CROSS/BLUE SHIELD | Admitting: Internal Medicine

## 2017-11-30 VITALS — BP 112/80 | HR 76 | Resp 97 | Ht 69.0 in | Wt 211.4 lb

## 2017-11-30 DIAGNOSIS — I5022 Chronic systolic (congestive) heart failure: Secondary | ICD-10-CM

## 2017-11-30 NOTE — Progress Notes (Signed)
HPI Antonio Cobb is referred today for consideration for ICD insertion. He is a pleasant 56 yo man with an ICM, s/p MI 2013, is referred today by Dr. Harl Bowie to consider insertion of an ICD for primary prevention of malignant ventricular arrhythmias.  The patient has a history of tobacco and ethanol abuse.  He has had recurrent episodes of acute decompensated systolic heart failure.  He is now on maximal medical therapy and is maintaining a low-sodium diet.  His heart failure symptoms have improved.  By echo, his ejection fraction is 25% and his QRS is narrow.  He has never had frank syncope.  He denies anginal symptoms. Allergies  Allergen Reactions  . Other     Seasonal allergies  - has to use inhaler     Current Outpatient Medications  Medication Sig Dispense Refill  . albuterol (PROVENTIL HFA;VENTOLIN HFA) 108 (90 Base) MCG/ACT inhaler Inhale 2 puffs into the lungs every 6 (six) hours as needed for wheezing or shortness of breath. 18 g 0  . amLODipine (NORVASC) 10 MG tablet Take 1 tablet (10 mg total) by mouth daily. 90 tablet 1  . aspirin (ASPIRIN ADULT LOW STRENGTH) 81 MG EC tablet Take 81 mg by mouth daily.      [name] atorvastatin (LIPITOR) 40 MG tablet Take 1 tablet (40 mg total) by mouth daily. 90 tablet 1  . budesonide-formoterol (SYMBICORT) 80-4.5 MCG/ACT inhaler Inhale 2 puffs into the lungs 2 (two) times daily. 1 Inhaler 3  . docusate sodium (COLACE) 100 MG capsule Take 100 mg by mouth 2 (two) times daily.    [name] FREESTYLE LITE test strip TEST four times a day 100 each 6  . furosemide (LASIX) 40 MG tablet Take 40 mg by mouth 2 (two) times daily.    . Insulin Glargine (LANTUS SOLOSTAR) 100 UNIT/ML Solostar Pen Inject 50 Units daily at 10 pm into the skin. Everynight     . insulin regular (NOVOLIN R,HUMULIN R) 100 units/mL injection Inject 10 Units 3 (three) times daily before meals into the skin.    [name] ipratropium-albuterol (DUONEB) 0.5-2.5 (3) MG/3ML SOLN Take 3 mLs by  nebulization every 6 (six) hours as needed. (Patient taking differently: Take 3 mLs by nebulization every 6 (six) hours as needed (for wheezing or shortness of breath). ) 360 mL 0  . Lancets (FREESTYLE) lancets Four times daily testing dx e11.65 150 each 1  . losartan (COZAAR) 25 MG tablet Take 12.5 mg by mouth daily.    . metoprolol succinate (TOPROL-XL) 50 MG 24 hr tablet take 1 tablet once daily with food 90 tablet 1  . nitroGLYCERIN (NITROSTAT) 0.4 MG SL tablet Place 1 tablet (0.4 mg total) under the tongue every 5 (five) minutes as needed for chest pain. 25 tablet 3  . NON FORMULARY Take by mouth as directed. Beet Juice    . potassium chloride SA (KLOR-CON M20) 20 MEQ tablet Take 1 tablet (20 mEq total) by mouth daily. 90 tablet 1  . spironolactone (ALDACTONE) 25 MG tablet Take 1 tablet (25 mg total) by mouth daily. 90 tablet 1  . tiotropium (SPIRIVA HANDIHALER) 18 MCG inhalation capsule inhale the contents of one capsule in the handihaler once daily 90 capsule 1  . temazepam (RESTORIL) 7.5 MG capsule take 1 capsule by mouth at bedtime if needed (Patient not taking: Reported on 11/30/2017) 30 capsule 3   No current facility-administered medications for this visit.      Past Medical History:  Diagnosis  Date  . Alcohol abuse   . CAD (coronary artery disease)    DES to circumflex June 2013  . Cardiomyopathy (HCC)    LVEF 30-35%  . COPD (chronic obstructive pulmonary disease) ([place])   . Diabetes mellitus, type 2 ([place])   . Essential hypertension   . Mitral regurgitation    Moderate  . Mixed hyperlipidemia   . Myocardial infarction ([place]) 05/23/2012   Inferolateral STEMI  . Obesity     ROS:   All systems reviewed and negative except as noted in the HPI.   Past Surgical History:  Procedure Laterality Date  . CARDIAC CATHETERIZATION  2 yrs ago  . CARDIAC CATHETERIZATION N/A 05/15/2016   Procedure: Right/Left Heart Cath and Coronary Angiography;  Surgeon: [name], MD;   Location: [place] [facility];  Service: Cardiovascular;  Laterality: N/A;  . CORONARY STENT PLACEMENT  05/23/12  . LEFT HEART CATHETERIZATION WITH CORONARY ANGIOGRAM N/A 05/23/2012   Procedure: LEFT HEART CATHETERIZATION WITH CORONARY ANGIOGRAM;  Surgeon: [name], MD;  Location: [facility] CATH LAB;  Service: Cardiovascular;  Laterality: N/A;  . None    . PERCUTANEOUS CORONARY STENT INTERVENTION (PCI-S) N/A 05/23/2012   Procedure: PERCUTANEOUS CORONARY STENT INTERVENTION (PCI-S);  Surgeon: [name], MD;  Location: [facility] CATH LAB;  Service: Cardiovascular;  Laterality: N/A;  . POLYPECTOMY  10/16/2011   Procedure: POLYPECTOMY;  Surgeon: [name], MD;  Location: [facility];  Service: Endoscopy;;  Polypoid Lesion, Transverse and Sigmoid Colon     Family History  Problem Relation Age of Onset  . Emphysema Mother   . Heart attack Mother   . Hypertension Mother   . Diabetes Mother   . Heart disease Mother   . COPD Mother   . Emphysema Father   . COPD Father   . Stroke Sister   . Asthma Sister   . Colon cancer Neg Hx   . Anesthesia problems Neg Hx   . Hypotension Neg Hx   . Malignant hyperthermia Neg Hx   . Pseudochol deficiency Neg Hx      Social History   Socioeconomic History  . Marital status: Married    Spouse name: Not on file  . Number of children: Not on file  . Years of education: Not on file  . Highest education level: Not on file  Social Needs  . Financial resource strain: Not on file  . Food insecurity - worry: Not on file  . Food insecurity - inability: Not on file  . Transportation needs - medical: Not on file  . Transportation needs - non-medical: Not on file  Occupational History  . Occupation: Therapist, sports: [employer]  Tobacco Use  . Smoking status: Former Smoker    Packs/day: 0.25    Years: 30.00    Pack years: 7.50    Types: Cigars    Last attempt to quit: 05/23/2016    Years since quitting: 1.5  . Smokeless tobacco: Never Used  .  Tobacco comment: since early 9s  Substance and Sexual Activity  . Alcohol use: Yes    Alcohol/week: 0.0 oz    Comment: occ  . Drug use: No  . Sexual activity: Not on file  Other Topics Concern  . Not on file  Social History Narrative  . Not on file     BP 112/80   Pulse 76   Resp (!) 97   Ht 5\' 9"  (1.753 m)   Wt 211 lb 6.4 oz (  95.9 kg)   BMI 31.22 kg/m   Physical Exam:  Well appearing 56 year old man, NAD HEENT: Unremarkable Neck: 6 cm JVD, no thyromegally Lymphatics:  No adenopathy Back:  No CVA tenderness Lungs:  Clear, with no wheezes, rales, or rhonchi HEART:  Regular rate rhythm, no murmurs, no rubs, no clicks Abd:  soft, positive bowel sounds, no organomegally, no rebound, no guarding Ext:  2 plus pulses, no edema, no cyanosis, no clubbing Skin:  No rashes no nodules Neuro:  CN II through XII intact, motor grossly intact  DEVICE  Normal device function.  See PaceArt for details.   Assess/Plan: 1.  Chronic systolic heart failure -his symptoms are class II.  He is on maximal medical therapy.  I discussed the treatment options with the patient and I recommended insertion of a primary prevention ICD.  He is reflecting on this and will call us if he would like to schedule. 2.  Ischemic cardiomyopathy -he denies anginal symptoms and remains fairly active.  He will continue his current medications. 3.  Tobacco abuse -he currently denies using tobacco.  He is encouraged to not smoke. 4.  Mitral regurgitation -by echo this is mild.  He is encouraged to maintain a low-sodium diet and control his blood pressure.  Crissie Sickles, MD

## 2017-11-30 NOTE — Patient Instructions (Signed)
Medication Instructions:  Your physician recommends that you continue on your current medications as directed. Please refer to the Current Medication list given to you today.  Labwork: If you decide to go ahead with the defibrillator you will need to come to the office 14 days before we schedule the procedure for blood work and to get your instruction letter and surgical scrub.    Testing/Procedures: Your physician has recommended that you have a defibrillator inserted. An implantable cardioverter defibrillator (ICD) is a small device that is placed in your chest or, in rare cases, your abdomen. This device uses electrical pulses or shocks to help control life-threatening, irregular heartbeats that could lead the heart to suddenly stop beating (sudden cardiac arrest). Leads are attached to the ICD that goes into your heart. This is done in the hospital and usually requires an overnight stay. Please see the instruction sheet given to you today for more information.  The following dates are available:   January 10, 16, 18, 21, 23, 28 and 31 Call me if you decide to go ahead with the procedure:  Myrtie Hawk, RN [phone]  Follow-Up: If you have the defibrillator placed you will have 2 follow up appointments scheduled after your procedure.  A wound check 10-14 days after your procedure and then a follow up appointment with Dr. Lovena Le 91 days after your procedure.  Any Other Special Instructions Will Be Listed Below (If Applicable).  Cardioverter Defibrillator Implantation An implantable cardioverter defibrillator (ICD) is a small device that is placed under the skin in the chest or abdomen. An ICD consists of a battery, a small computer (pulse generator), and wires (leads) that go into the heart. An ICD is used to detect and correct two types of dangerous irregular heartbeats (arrhythmias):  A rapid heart rhythm (tachycardia).  An arrhythmia in which the lower chambers of the heart  (ventricles) contract in an uncoordinated way (fibrillation).  When an ICD detects tachycardia, it sends a low-energy shock to the heart to restore the heartbeat to normal (cardioversion). This signal is usually painless. If cardioversion does not work or if the ICD detects fibrillation, it delivers a high-energy shock to the heart (defibrillation) to restart the heart. This shock may feel like a strong jolt in the chest. Your health care provider may prescribe an ICD if:  You have had an arrhythmia that originated in the ventricles.  Your heart has been damaged by a disease or heart condition.  Sometimes, ICDs are programmed to act as a device called a pacemaker. Pacemakers can be used to treat a slow heartbeat (bradycardia) or tachycardia by taking over the heart rate with electrical impulses. Tell a health care provider about:  Any allergies you have.  All medicines you are taking, including vitamins, herbs, eye drops, creams, and over-the-counter medicines.  Any problems you or family members have had with anesthetic medicines.  Any blood disorders you have.  Any surgeries you have had.  Any medical conditions you have.  Whether you are pregnant or may be pregnant. What are the risks? Generally, this is a safe procedure. However, problems may occur, including:  Swelling, bleeding, or bruising.  Infection.  Blood clots.  Damage to other structures or organs, such as nerves, blood vessels, or the heart.  Allergic reactions to medicines used during the procedure.  What happens before the procedure? Staying hydrated Follow instructions from your health care provider about hydration, which may include:  Up to 2 hours before the procedure - you may  continue to drink clear liquids, such as water, clear fruit juice, black coffee, and plain tea.  Eating and drinking restrictions Follow instructions from your health care provider about eating and drinking, which may  include:  8 hours before the procedure - stop eating heavy meals or foods such as meat, fried foods, or fatty foods.  6 hours before the procedure - stop eating light meals or foods, such as toast or cereal.  6 hours before the procedure - stop drinking milk or drinks that contain milk.  2 hours before the procedure - stop drinking clear liquids.  Medicine Ask your health care provider about:  Changing or stopping your normal medicines. This is important if you take diabetes medicines or blood thinners.  Taking medicines such as aspirin and ibuprofen. These medicines can thin your blood. Do not take these medicines before your procedure if your doctor tells you not to.  Tests  You may have blood tests.  You may have a test to check the electrical signals in your heart (electrocardiogram, ECG).  You may have imaging tests, such as a chest X-ray. General instructions  For 24 hours before the procedure, stop using products that contain nicotine or tobacco, such as cigarettes and e-cigarettes. If you need help quitting, ask your health care provider.  Plan to have someone take you home from the hospital or clinic.  You may be asked to shower with a germ-killing soap. What happens during the procedure?  To reduce your risk of infection: ? Your health care team will wash or sanitize their hands. ? Your skin will be washed with soap. ? Hair may be removed from the surgical area.  Small monitors will be put on your body. They will be used to check your heart, blood pressure, and oxygen level.  An IV tube will be inserted into one of your veins.  You will be given one or more of the following: ? A medicine to help you relax (sedative). ? A medicine to numb the area (local anesthetic). ? A medicine to make you fall asleep (general anesthetic).  Leads will be guided through a blood vessel into your heart and attached to your heart muscles. Depending on the ICD, the leads may go  into one ventricle or they may go into both ventricles and into an upper chamber of the heart. An X-ray machine (fluoroscope) will be usedto help guide the leads.  A small incision will be made to create a deep pocket under your skin.  The pulse generator will be placed into the pocket.  The ICD will be tested.  The incision will be closed with stitches (sutures), skin glue, or staples.  A bandage (dressing) will be placed over the incision. This procedure may vary among health care providers and hospitals. What happens after the procedure?  Your blood pressure, heart rate, breathing rate, and blood oxygen level will be monitored often until the medicines you were given have worn off.  A chest X-ray will be taken to check that the ICD is in the right place.  You will need to stay in the hospital for 1-2 days so your health care provider can make sure your ICD is working.  Do not drive for 24 hours if you received a sedative. Ask your health care provider when it is safe for you to drive.  You may be given an identification card explaining that you have an ICD. Summary  An implantable cardioverter defibrillator (ICD) is a small device  that is placed under the skin in the chest or abdomen. It is used to detect and correct dangerous irregular heartbeats (arrhythmias).  An ICD consists of a battery, a small computer (pulse generator), and wires (leads) that go into the heart.  When an ICD detects rapid heart rhythm (tachycardia), it sends a low-energy shock to the heart to restore the heartbeat to normal (cardioversion). If cardioversion does not work or if the ICD detects uncoordinated heart contractions (fibrillation), it delivers a high-energy shock to the heart (defibrillation) to restart the heart.  You will need to stay in the hospital for 1-2 days to make sure your ICD is working. This information is not intended to replace advice given to you by your health care provider. Make  sure you discuss any questions you have with your health care provider. Document Released: 08/22/2002 Document Revised: 12/09/2016 Document Reviewed: 12/09/2016 Elsevier Interactive Patient Education  2017 [employer].    If you need a refill on your cardiac medications before your next appointment, please call your pharmacy.

## 2017-12-08 ENCOUNTER — Other Ambulatory Visit: Payer: Self-pay | Admitting: Cardiology

## 2017-12-30 ENCOUNTER — Telehealth: Payer: Self-pay

## 2017-12-30 NOTE — Telephone Encounter (Signed)
Pt called.  Ready to schedule ICD.  Returned call and left message to call this nurse back.

## 2018-01-19 ENCOUNTER — Ambulatory Visit: Payer: BLUE CROSS/BLUE SHIELD | Admitting: Family Medicine

## 2018-01-20 ENCOUNTER — Telehealth: Payer: Self-pay

## 2018-01-20 DIAGNOSIS — I428 Other cardiomyopathies: Secondary | ICD-10-CM

## 2018-01-20 DIAGNOSIS — I5022 Chronic systolic (congestive) heart failure: Secondary | ICD-10-CM

## 2018-01-20 NOTE — Telephone Encounter (Signed)
Incoming phone call from Pt.  Will return call.

## 2018-01-21 NOTE — Telephone Encounter (Signed)
Call placed to Pt.  Went to VM.  Left message requesting call back.  Number given for office for call back.

## 2018-01-25 ENCOUNTER — Other Ambulatory Visit: Payer: Self-pay

## 2018-01-25 MED ORDER — SPIRONOLACTONE 25 MG PO TABS: 25.0000 mg | ORAL_TABLET | Freq: Every day | ORAL | 1 refills | Status: DC

## 2018-01-25 NOTE — Telephone Encounter (Signed)
Call placed to Pt wife.  Scheduled ICD placement for February 01, 2018 @ 9:30.  Pt will get labs drawn in [place] and pick up instruction letter at [place] office in [place].  Pt has surgical soap.  Will enter labs and alert Arthur.

## 2018-01-27 ENCOUNTER — Other Ambulatory Visit (HOSPITAL_COMMUNITY)
Admission: RE | Admit: 2018-01-27 | Discharge: 2018-01-27 | Disposition: A | Discharge status: Home / Self Care | Payer: BLUE CROSS/BLUE SHIELD | Source: Ambulatory Visit | Attending: Internal Medicine | Admitting: Internal Medicine

## 2018-01-27 DIAGNOSIS — I5022 Chronic systolic (congestive) heart failure: Secondary | ICD-10-CM | POA: Diagnosis not present

## 2018-01-27 DIAGNOSIS — I428 Other cardiomyopathies: Secondary | ICD-10-CM | POA: Diagnosis not present

## 2018-01-27 LAB — BASIC METABOLIC PANEL
Anion gap: 11 (ref 5–15)
BUN: 29 mg/dL — AB (ref 6–20)
CHLORIDE: 102 mmol/L (ref 101–111)
CO2: 26 mmol/L (ref 22–32)
Calcium: 9.3 mg/dL (ref 8.9–10.3)
Creatinine, Ser: 1.49 mg/dL — ABNORMAL HIGH (ref 0.61–1.24)
GFR calc Af Amer: 58 mL/min — ABNORMAL LOW (ref >60)
GFR calc non Af Amer: 50 mL/min — ABNORMAL LOW (ref >60)
GLUCOSE: 145 mg/dL — AB (ref 65–99)
POTASSIUM: 4 mmol/L (ref 3.5–5.1)
SODIUM: 139 mmol/L (ref 135–145)

## 2018-01-27 LAB — CBC WITH DIFFERENTIAL/PLATELET
Basophils Absolute: 0.1 10*3/uL (ref 0.0–0.1)
Basophils Relative: 1 %
EOS ABS: 1.3 10*3/uL — AB (ref 0.0–0.7)
EOS PCT: 14 %
HCT: 40.2 % (ref 39.0–52.0)
HEMOGLOBIN: 13.3 g/dL (ref 13.0–17.0)
LYMPHS ABS: 2.9 10*3/uL (ref 0.7–4.0)
Lymphocytes Relative: 31 %
MCH: 29.4 pg (ref 26.0–34.0)
MCHC: 33.1 g/dL (ref 30.0–36.0)
MCV: 88.9 fL (ref 78.0–100.0)
MONOS PCT: 9 %
Monocytes Absolute: 0.9 10*3/uL (ref 0.1–1.0)
NEUTROS PCT: 45 %
Neutro Abs: 4.2 10*3/uL (ref 1.7–7.7)
Platelets: 211 10*3/uL (ref 150–400)
RBC: 4.52 MIL/uL (ref 4.22–5.81)
RDW: 12.5 % (ref 11.5–15.5)
WBC: 9.4 10*3/uL (ref 4.0–10.5)

## 2018-01-31 ENCOUNTER — Other Ambulatory Visit: Payer: Self-pay

## 2018-01-31 ENCOUNTER — Other Ambulatory Visit: Payer: Self-pay | Admitting: *Deleted

## 2018-01-31 MED ORDER — METOPROLOL SUCCINATE ER 50 MG PO TB24: ORAL_TABLET | ORAL | 0 refills | Status: DC

## 2018-01-31 MED ORDER — ATORVASTATIN CALCIUM 40 MG PO TABS: 40.0000 mg | ORAL_TABLET | Freq: Every day | ORAL | 0 refills | Status: DC

## 2018-01-31 MED ORDER — FUROSEMIDE 40 MG PO TABS: 40.0000 mg | ORAL_TABLET | Freq: Two times a day (BID) | ORAL | 6 refills | Status: DC

## 2018-01-31 MED ORDER — LOSARTAN POTASSIUM 25 MG PO TABS: 12.5000 mg | ORAL_TABLET | Freq: Every day | ORAL | 3 refills | Status: DC

## 2018-01-31 MED ORDER — AMLODIPINE BESYLATE 10 MG PO TABS: 10.0000 mg | ORAL_TABLET | Freq: Every day | ORAL | 1 refills | Status: DC

## 2018-02-01 ENCOUNTER — Encounter (HOSPITAL_COMMUNITY): Payer: Self-pay | Admitting: Internal Medicine

## 2018-02-01 ENCOUNTER — Other Ambulatory Visit: Payer: Self-pay

## 2018-02-01 ENCOUNTER — Ambulatory Visit (HOSPITAL_COMMUNITY)
Admission: RE | Admit: 2018-02-01 | Discharge: 2018-02-02 | Disposition: A | Discharge status: Home / Self Care | Payer: BLUE CROSS/BLUE SHIELD | Source: Ambulatory Visit | Attending: Internal Medicine | Admitting: Internal Medicine

## 2018-02-01 ENCOUNTER — Ambulatory Visit (HOSPITAL_COMMUNITY)
Admission: RE | Disposition: A | Discharge status: Home / Self Care | Payer: Self-pay | Source: Ambulatory Visit | Attending: Internal Medicine

## 2018-02-01 DIAGNOSIS — I110 Hypertensive heart disease with heart failure: Secondary | ICD-10-CM | POA: Insufficient documentation

## 2018-02-01 DIAGNOSIS — I340 Nonrheumatic mitral (valve) insufficiency: Secondary | ICD-10-CM | POA: Diagnosis not present

## 2018-02-01 DIAGNOSIS — I255 Ischemic cardiomyopathy: Secondary | ICD-10-CM | POA: Diagnosis not present

## 2018-02-01 DIAGNOSIS — E119 Type 2 diabetes mellitus without complications: Secondary | ICD-10-CM | POA: Insufficient documentation

## 2018-02-01 DIAGNOSIS — I252 Old myocardial infarction: Secondary | ICD-10-CM | POA: Insufficient documentation

## 2018-02-01 DIAGNOSIS — I5022 Chronic systolic (congestive) heart failure: Secondary | ICD-10-CM | POA: Diagnosis present

## 2018-02-01 DIAGNOSIS — Z794 Long term (current) use of insulin: Secondary | ICD-10-CM | POA: Diagnosis not present

## 2018-02-01 DIAGNOSIS — Z79899 Other long term (current) drug therapy: Secondary | ICD-10-CM | POA: Diagnosis not present

## 2018-02-01 DIAGNOSIS — Z7982 Long term (current) use of aspirin: Secondary | ICD-10-CM | POA: Insufficient documentation

## 2018-02-01 DIAGNOSIS — I2510 Atherosclerotic heart disease of native coronary artery without angina pectoris: Secondary | ICD-10-CM | POA: Diagnosis not present

## 2018-02-01 DIAGNOSIS — Z95810 Presence of automatic (implantable) cardiac defibrillator: Secondary | ICD-10-CM

## 2018-02-01 DIAGNOSIS — E669 Obesity, unspecified: Secondary | ICD-10-CM | POA: Diagnosis not present

## 2018-02-01 DIAGNOSIS — Z7951 Long term (current) use of inhaled steroids: Secondary | ICD-10-CM | POA: Insufficient documentation

## 2018-02-01 DIAGNOSIS — Z006 Encounter for examination for normal comparison and control in clinical research program: Secondary | ICD-10-CM | POA: Diagnosis not present

## 2018-02-01 DIAGNOSIS — E782 Mixed hyperlipidemia: Secondary | ICD-10-CM | POA: Insufficient documentation

## 2018-02-01 DIAGNOSIS — Z6829 Body mass index (BMI) 29.0-29.9, adult: Secondary | ICD-10-CM | POA: Diagnosis not present

## 2018-02-01 DIAGNOSIS — Z87891 Personal history of nicotine dependence: Secondary | ICD-10-CM | POA: Diagnosis not present

## 2018-02-01 DIAGNOSIS — J449 Chronic obstructive pulmonary disease, unspecified: Secondary | ICD-10-CM | POA: Insufficient documentation

## 2018-02-01 DIAGNOSIS — Z8249 Family history of ischemic heart disease and other diseases of the circulatory system: Secondary | ICD-10-CM | POA: Diagnosis not present

## 2018-02-01 HISTORY — PX: ICD IMPLANT: EP1208

## 2018-02-01 LAB — GLUCOSE, CAPILLARY
GLUCOSE-CAPILLARY: 242 mg/dL — AB (ref 65–99)
Glucose-Capillary: 217 mg/dL — ABNORMAL HIGH (ref 65–99)
Glucose-Capillary: 203 mg/dL — ABNORMAL HIGH (ref 65–99)
Glucose-Capillary: 153 mg/dL — ABNORMAL HIGH (ref 65–99)

## 2018-02-01 LAB — SURGICAL PCR SCREEN
MRSA, PCR: NEGATIVE
STAPHYLOCOCCUS AUREUS: NEGATIVE

## 2018-02-01 SURGERY — ICD IMPLANT

## 2018-02-01 MED ORDER — INSULIN ASPART 100 UNIT/ML ~~LOC~~ SOLN
10.0000 [IU] | Freq: Three times a day (TID) | SUBCUTANEOUS | Status: DC
  Administered 2018-02-01 – 2018-02-02 (×2): 10 [IU] via SUBCUTANEOUS

## 2018-02-01 MED ORDER — SODIUM CHLORIDE 0.9 % IR SOLN
Status: AC
  Filled 2018-02-01: qty 2

## 2018-02-01 MED ORDER — LIDOCAINE HCL (PF) 1 % IJ SOLN
INTRAMUSCULAR | Status: AC
  Filled 2018-02-01: qty 60

## 2018-02-01 MED ORDER — ATORVASTATIN CALCIUM 40 MG PO TABS
40.0000 mg | ORAL_TABLET | Freq: Every day | ORAL | Status: DC
  Administered 2018-02-01: 40 mg via ORAL
  Filled 2018-02-01 (×2): qty 1

## 2018-02-01 MED ORDER — FUROSEMIDE 40 MG PO TABS
40.0000 mg | ORAL_TABLET | Freq: Two times a day (BID) | ORAL | Status: DC
Start: 2018-02-01 — End: 2018-02-02
  Administered 2018-02-01 – 2018-02-02 (×2): 40 mg via ORAL
  Filled 2018-02-01 (×2): qty 1

## 2018-02-01 MED ORDER — MIDAZOLAM HCL 5 MG/5ML IJ SOLN
INTRAMUSCULAR | Status: AC
  Filled 2018-02-01: qty 5

## 2018-02-01 MED ORDER — MUPIROCIN 2 % EX OINT
TOPICAL_OINTMENT | CUTANEOUS | Status: AC
  Administered 2018-02-01: 1
  Filled 2018-02-01: qty 22

## 2018-02-01 MED ORDER — METOPROLOL SUCCINATE ER 25 MG PO TB24
25.0000 mg | ORAL_TABLET | Freq: Every day | ORAL | Status: DC
  Administered 2018-02-02: 25 mg via ORAL
  Filled 2018-02-01: qty 1

## 2018-02-01 MED ORDER — LOSARTAN POTASSIUM 25 MG PO TABS
12.5000 mg | ORAL_TABLET | Freq: Every day | ORAL | Status: DC
  Administered 2018-02-02: 12.5 mg via ORAL
  Filled 2018-02-01 (×2): qty 1

## 2018-02-01 MED ORDER — CHLORHEXIDINE GLUCONATE 4 % EX LIQD: 60.0000 mL | Freq: Once | CUTANEOUS | Status: DC

## 2018-02-01 MED ORDER — FENTANYL CITRATE (PF) 100 MCG/2ML IJ SOLN
INTRAMUSCULAR | Status: AC
  Filled 2018-02-01: qty 2

## 2018-02-01 MED ORDER — FENTANYL CITRATE (PF) 100 MCG/2ML IJ SOLN
INTRAMUSCULAR | Status: DC | PRN
  Administered 2018-02-01: 25 ug via INTRAVENOUS
  Administered 2018-02-01 (×3): 12.5 ug via INTRAVENOUS

## 2018-02-01 MED ORDER — ALBUTEROL SULFATE (2.5 MG/3ML) 0.083% IN NEBU: 2.5000 mg | INHALATION_SOLUTION | Freq: Four times a day (QID) | RESPIRATORY_TRACT | Status: DC | PRN

## 2018-02-01 MED ORDER — HEPARIN (PORCINE) IN NACL 2-0.9 UNIT/ML-% IJ SOLN
INTRAMUSCULAR | Status: AC
  Filled 2018-02-01: qty 500

## 2018-02-01 MED ORDER — NITROGLYCERIN 0.4 MG SL SUBL: 0.4000 mg | SUBLINGUAL_TABLET | SUBLINGUAL | Status: DC | PRN

## 2018-02-01 MED ORDER — INSULIN GLARGINE 100 UNIT/ML ~~LOC~~ SOLN
50.0000 [IU] | Freq: Every day | SUBCUTANEOUS | Status: DC
  Filled 2018-02-01: qty 0.5

## 2018-02-01 MED ORDER — MIDAZOLAM HCL 5 MG/5ML IJ SOLN
INTRAMUSCULAR | Status: DC | PRN
  Administered 2018-02-01: 2 mg via INTRAVENOUS
  Administered 2018-02-01 (×5): 1 mg via INTRAVENOUS

## 2018-02-01 MED ORDER — DOCUSATE SODIUM 100 MG PO CAPS
100.0000 mg | ORAL_CAPSULE | Freq: Two times a day (BID) | ORAL | Status: DC
  Administered 2018-02-01 – 2018-02-02 (×3): 100 mg via ORAL
  Filled 2018-02-01 (×3): qty 1

## 2018-02-01 MED ORDER — CEFAZOLIN SODIUM-DEXTROSE 2-4 GM/100ML-% IV SOLN
INTRAVENOUS | Status: AC
  Filled 2018-02-01: qty 100

## 2018-02-01 MED ORDER — ASPIRIN EC 81 MG PO TBEC
81.0000 mg | DELAYED_RELEASE_TABLET | Freq: Every day | ORAL | Status: DC
  Administered 2018-02-02: 81 mg via ORAL
  Filled 2018-02-01: qty 1

## 2018-02-01 MED ORDER — ONDANSETRON HCL 4 MG/2ML IJ SOLN: 4.0000 mg | Freq: Four times a day (QID) | INTRAMUSCULAR | Status: DC | PRN

## 2018-02-01 MED ORDER — LIDOCAINE HCL (PF) 1 % IJ SOLN
INTRAMUSCULAR | Status: DC | PRN
  Administered 2018-02-01: 60 mL via INTRADERMAL

## 2018-02-01 MED ORDER — CEFAZOLIN SODIUM-DEXTROSE 1-4 GM/50ML-% IV SOLN
1.0000 g | Freq: Four times a day (QID) | INTRAVENOUS | Status: AC
  Administered 2018-02-01 – 2018-02-02 (×3): 1 g via INTRAVENOUS
  Filled 2018-02-01 (×3): qty 50

## 2018-02-01 MED ORDER — CEFAZOLIN SODIUM-DEXTROSE 2-4 GM/100ML-% IV SOLN
2.0000 g | INTRAVENOUS | Status: AC
  Administered 2018-02-01: 2 g via INTRAVENOUS

## 2018-02-01 MED ORDER — AMLODIPINE BESYLATE 10 MG PO TABS
10.0000 mg | ORAL_TABLET | Freq: Every day | ORAL | Status: DC
  Administered 2018-02-02: 10 mg via ORAL
  Filled 2018-02-01: qty 1

## 2018-02-01 MED ORDER — SODIUM CHLORIDE 0.9 % IR SOLN
80.0000 mg | Status: AC
  Administered 2018-02-01: 80 mg

## 2018-02-01 MED ORDER — LOSARTAN POTASSIUM 25 MG PO TABS
12.5000 mg | ORAL_TABLET | Freq: Every day | ORAL | Status: DC
  Filled 2018-02-01: qty 1

## 2018-02-01 MED ORDER — HEPARIN (PORCINE) IN NACL 2-0.9 UNIT/ML-% IJ SOLN
INTRAMUSCULAR | Status: AC | PRN
  Administered 2018-02-01: 500 mL

## 2018-02-01 MED ORDER — SODIUM CHLORIDE 0.9 % IV SOLN
INTRAVENOUS | Status: DC
  Administered 2018-02-01: 08:00:00 via INTRAVENOUS

## 2018-02-01 MED ORDER — POTASSIUM CHLORIDE CRYS ER 20 MEQ PO TBCR
20.0000 meq | EXTENDED_RELEASE_TABLET | Freq: Every day | ORAL | Status: DC
  Administered 2018-02-02: 20 meq via ORAL
  Filled 2018-02-01: qty 1

## 2018-02-01 MED ORDER — SPIRONOLACTONE 25 MG PO TABS
25.0000 mg | ORAL_TABLET | Freq: Every day | ORAL | Status: DC
  Administered 2018-02-01 – 2018-02-02 (×2): 25 mg via ORAL
  Filled 2018-02-01 (×2): qty 1

## 2018-02-01 MED ORDER — ACETAMINOPHEN 325 MG PO TABS: 325.0000 mg | ORAL_TABLET | ORAL | Status: DC | PRN

## 2018-02-01 MED ORDER — TEMAZEPAM 7.5 MG PO CAPS: 7.5000 mg | ORAL_CAPSULE | Freq: Every evening | ORAL | Status: DC | PRN

## 2018-02-01 SURGICAL SUPPLY — 6 items
CABLE SURGICAL S-101-97-12 (CABLE) ×3 IMPLANT
ICD VIGILANT VR D232 (Pacemaker) ×3 IMPLANT
LEAD RELIANCE G DF4 0293 (Lead) ×3 IMPLANT
PAD DEFIB LIFELINK (PAD) ×3 IMPLANT
SHEATH CLASSIC 9F (SHEATH) ×3 IMPLANT
TRAY PACEMAKER INSERTION (PACKS) ×3 IMPLANT

## 2018-02-01 NOTE — H&P (Signed)
HPI Antonio Cobb is referred today for consideration for ICD insertion. He is a pleasant 57 yo man with an ICM, s/p MI 2013, is referred today by Dr. Harl Bowie to consider insertion of an ICD for primary prevention of malignant ventricular arrhythmias.  The patient has a history of tobacco and ethanol abuse.  He has had recurrent episodes of acute decompensated systolic heart failure.  He is now on maximal medical therapy and is maintaining a low-sodium diet.  His heart failure symptoms have improved.  By echo, his ejection fraction is 25% and his QRS is narrow.  He has never had frank syncope.  He denies anginal symptoms. Allergies  Allergen Reactions  . Other     Seasonal allergies  - has to use inhaler           Current Outpatient Medications  Medication Sig Dispense Refill  . albuterol (PROVENTIL HFA;VENTOLIN HFA) 108 (90 Base) MCG/ACT inhaler Inhale 2 puffs into the lungs every 6 (six) hours as needed for wheezing or shortness of breath. 18 g 0  . amLODipine (NORVASC) 10 MG tablet Take 1 tablet (10 mg total) by mouth daily. 90 tablet 1  . aspirin (ASPIRIN ADULT LOW STRENGTH) 81 MG EC tablet Take 81 mg by mouth daily.      [name] atorvastatin (LIPITOR) 40 MG tablet Take 1 tablet (40 mg total) by mouth daily. 90 tablet 1  . budesonide-formoterol (SYMBICORT) 80-4.5 MCG/ACT inhaler Inhale 2 puffs into the lungs 2 (two) times daily. 1 Inhaler 3  . docusate sodium (COLACE) 100 MG capsule Take 100 mg by mouth 2 (two) times daily.    [name] FREESTYLE LITE test strip TEST four times a day 100 each 6  . furosemide (LASIX) 40 MG tablet Take 40 mg by mouth 2 (two) times daily.    . Insulin Glargine (LANTUS SOLOSTAR) 100 UNIT/ML Solostar Pen Inject 50 Units daily at 10 pm into the skin. Everynight     . insulin regular (NOVOLIN R,HUMULIN R) 100 units/mL injection Inject 10 Units 3 (three) times daily before meals into the skin.    [name] ipratropium-albuterol (DUONEB) 0.5-2.5 (3) MG/3ML SOLN Take 3 mLs  by nebulization every 6 (six) hours as needed. (Patient taking differently: Take 3 mLs by nebulization every 6 (six) hours as needed (for wheezing or shortness of breath). ) 360 mL 0  . Lancets (FREESTYLE) lancets Four times daily testing dx e11.65 150 each 1  . losartan (COZAAR) 25 MG tablet Take 12.5 mg by mouth daily.    . metoprolol succinate (TOPROL-XL) 50 MG 24 hr tablet take 1 tablet once daily with food 90 tablet 1  . nitroGLYCERIN (NITROSTAT) 0.4 MG SL tablet Place 1 tablet (0.4 mg total) under the tongue every 5 (five) minutes as needed for chest pain. 25 tablet 3  . NON FORMULARY Take by mouth as directed. Beet Juice    . potassium chloride SA (KLOR-CON M20) 20 MEQ tablet Take 1 tablet (20 mEq total) by mouth daily. 90 tablet 1  . spironolactone (ALDACTONE) 25 MG tablet Take 1 tablet (25 mg total) by mouth daily. 90 tablet 1  . tiotropium (SPIRIVA HANDIHALER) 18 MCG inhalation capsule inhale the contents of one capsule in the handihaler once daily 90 capsule 1  . temazepam (RESTORIL) 7.5 MG capsule take 1 capsule by mouth at bedtime if needed (Patient not taking: Reported on 11/30/2017) 30 capsule 3   No current facility-administered medications for this visit.  Past Medical History:  Diagnosis Date  . Alcohol abuse   . CAD (coronary artery disease)    DES to circumflex June 2013  . Cardiomyopathy (HCC)    LVEF 30-35%  . COPD (chronic obstructive pulmonary disease) ([place])   . Diabetes mellitus, type 2 ([place])   . Essential hypertension   . Mitral regurgitation    Moderate  . Mixed hyperlipidemia   . Myocardial infarction ([place]) 05/23/2012   Inferolateral STEMI  . Obesity     ROS:   All systems reviewed and negative except as noted in the HPI.        Past Surgical History:  Procedure Laterality Date  . CARDIAC CATHETERIZATION  2 yrs ago  . CARDIAC CATHETERIZATION N/A 05/15/2016   Procedure: Right/Left Heart Cath and Coronary  Angiography;  Surgeon: [name], MD;  Location: [facility];  Service: Cardiovascular;  Laterality: N/A;  . CORONARY STENT PLACEMENT  05/23/12  . LEFT HEART CATHETERIZATION WITH CORONARY ANGIOGRAM N/A 05/23/2012   Procedure: LEFT HEART CATHETERIZATION WITH CORONARY ANGIOGRAM;  Surgeon: [name], MD;  Location: [facility] CATH LAB;  Service: Cardiovascular;  Laterality: N/A;  . None    . PERCUTANEOUS CORONARY STENT INTERVENTION (PCI-S) N/A 05/23/2012   Procedure: PERCUTANEOUS CORONARY STENT INTERVENTION (PCI-S);  Surgeon: [name], MD;  Location: [facility] CATH LAB;  Service: Cardiovascular;  Laterality: N/A;  . POLYPECTOMY  10/16/2011   Procedure: POLYPECTOMY;  Surgeon: [name], MD;  Location: [facility];  Service: Endoscopy;;  Polypoid Lesion, Transverse and Sigmoid Colon          Family History  Problem Relation Age of Onset  . Emphysema Mother   . Heart attack Mother   . Hypertension Mother   . Diabetes Mother   . Heart disease Mother   . COPD Mother   . Emphysema Father   . COPD Father   . Stroke Sister   . Asthma Sister   . Colon cancer Neg Hx   . Anesthesia problems Neg Hx   . Hypotension Neg Hx   . Malignant hyperthermia Neg Hx   . Pseudochol deficiency Neg Hx      Social History        Socioeconomic History  . Marital status: Married    Spouse name: Not on file  . Number of children: Not on file  . Years of education: Not on file  . Highest education level: Not on file  Social Needs  . Financial resource strain: Not on file  . Food insecurity - worry: Not on file  . Food insecurity - inability: Not on file  . Transportation needs - medical: Not on file  . Transportation needs - non-medical: Not on file  Occupational History  . Occupation: Therapist, sports: [employer]  Tobacco Use  . Smoking status: Former Smoker    Packs/day: 0.25    Years: 30.00    Pack years: 7.50    Types: Cigars     Last attempt to quit: 05/23/2016    Years since quitting: 1.5  . Smokeless tobacco: Never Used  . Tobacco comment: since early 63s  Substance and Sexual Activity  . Alcohol use: Yes    Alcohol/week: 0.0 oz    Comment: occ  . Drug use: No  . Sexual activity: Not on file  Other Topics Concern  . Not on file  Social History Narrative  . Not on file     BP 112/80   Pulse  76   Resp (!) 97   Ht 5\' 9"  (1.753 m)   Wt 211 lb 6.4 oz (95.9 kg)   BMI 31.22 kg/m   Physical Exam:  Well appearing 57 year old man, NAD HEENT: Unremarkable Neck: 6 cm JVD, no thyromegally Lymphatics:  No adenopathy Back:  No CVA tenderness Lungs:  Clear, with no wheezes, rales, or rhonchi HEART:  Regular rate rhythm, no murmurs, no rubs, no clicks Abd:  soft, positive bowel sounds, no organomegally, no rebound, no guarding Ext:  2 plus pulses, no edema, no cyanosis, no clubbing Skin:  No rashes no nodules Neuro:  CN II through XII intact, motor grossly intact  DEVICE  Normal device function.  See PaceArt for details.   Assess/Plan: 1.  Chronic systolic heart failure -his symptoms are class II.  He is on maximal medical therapy.  I discussed the treatment options with the patient and I recommended insertion of a primary prevention ICD.  He is reflecting on this and will call us if he would like to schedule. 2.  Ischemic cardiomyopathy -he denies anginal symptoms and remains fairly active.  He will continue his current medications. 3.  Tobacco abuse -he currently denies using tobacco.  He is encouraged to not smoke. 4.  Mitral regurgitation -by echo this is mild.  He is encouraged to maintain a low-sodium diet and control his blood pressure.  Crissie Sickles, MD  EP attending  Patient seen and examined. Agree with above. No change since prior clinic visit. Will plan to proceed with ICD insertion for primary prevention of malignant ventricular arrhythmias in the setting of an ischemic  CM.  Mikle Bosworth.D.

## 2018-02-01 NOTE — Discharge Instructions (Signed)
° ° °  Supplemental Discharge Instructions for  °Pacemaker/Defibrillator Patients ° °Activity °No heavy lifting or vigorous activity with your left/right arm for 6 to 8 weeks.  Do not raise your left/right arm above your head for one week.  Gradually raise your affected arm as drawn below. ° °        °    02/05/18                    02/06/18                     02/07/18                  02/08/18 °__ ° °NO DRIVING for  1 week   ; you may begin driving on   02/08/18  . ° °WOUND CARE °- Keep the wound area clean and dry.  Do not get this area wet for one week. No showers for one week; you may shower on  02/08/18  . °- The tape/steri-strips on your wound will fall off; do not pull them off.  No bandage is needed on the site.  DO  NOT apply any creams, oils, or ointments to the wound area. °- If you notice any drainage or discharge from the wound, any swelling or bruising at the site, or you develop a fever > 101? F after you are discharged home, call the office at once. ° °Special Instructions °- You are still able to use cellular telephones; use the ear opposite the side where you have your pacemaker/defibrillator.  Avoid carrying your cellular phone near your device. °- When traveling through airports, show security personnel your identification card to avoid being screened in the metal detectors.  Ask the security personnel to use the hand wand. °- Avoid arc welding equipment, MRI testing (magnetic resonance imaging), TENS units (transcutaneous nerve stimulators).  Call the office for questions about other devices. °- Avoid electrical appliances that are in poor condition or are not properly grounded. °- Microwave ovens are safe to be near or to operate. ° °Additional information for defibrillator patients should your device go off: °- If your device goes off ONCE and you feel fine afterward, notify the device clinic nurses. °- If your device goes off ONCE and you do not feel well afterward, call 911. °- If your device goes  off TWICE, call 911. °- If your device goes off THREE times in one day, call 911. ° °DO NOT DRIVE YOURSELF OR A FAMILY MEMBER °WITH A DEFIBRILLATOR TO THE HOSPITAL--CALL 911. ° °

## 2018-02-01 NOTE — Discharge Summary (Addendum)
ELECTROPHYSIOLOGY PROCEDURE DISCHARGE SUMMARY    Patient ID: Antonio Cobb,  MRN: [id number], DOB/AGE: [birth year] 57 y.o.  Admit date: 02/01/2018 Discharge date: 02/02/18  Primary Care Physician: [name], MD  Primary Cardiologist: Dr. Harl Bowie Electrophysiologist: Dr. Lovena Le  Primary Discharge Diagnosis:  1. ICM  Secondary Discharge Diagnosis:  1. Chronic CHF (systolic) 2. CAD 3. COPD 4. HTN 5. DM  Allergies  Allergen Reactions  . Atorvastatin Other (See Comments)    Myalgias  . Other     Seasonal allergies  - has to use inhaler     Procedures This Admission:  1.  Implantation of a BSCi single chamber ICD on 01/1918 by Dr Lovena Le.  The patient received a [employer] (serial number [id number]) right ventricular defibrillator lead, [employer] (serial Number [id number]) ICD DFT's were successful at 4 J.   There were no immediate post procedure complications. 2.  CXR on 02/02/18 demonstrated no pneumothorax status post device implantation.   Brief HPI: Antonio Cobb is a 57 y.o. male was referred to electrophysiology in the outpatient setting for consideration of ICD implantation.  Past medical history is noted above.  The patient has persistent LV dysfunction despite guideline directed therapy.  Risks, benefits, and alternatives to ICD implantation were reviewed with the patient who wished to proceed.   Hospital Course:  The patient was admitted and underwent implantation of an ICD with details as outlined above. He was monitored on telemetry overnight which demonstrated SR.  Left chest was without hematoma or ecchymosis.  The device was interrogated and found to be functioning normally.  CXR was obtained and demonstrated no pneumothorax status post device implantation.  The patient had self reduced his lipitor to only a few times a week with some pain to his shins that resolves with reduced dosing, he has an appointment today to see Dr. Harl Bowie, he wants to keep  this appointment anyway, though will discuss his statin management/therapy with him.  Wound care, arm mobility, and restrictions were reviewed with the patient.  The patient feels well this morning with CP, palpitations or SOB, was examined by Dr. Lovena Le and considered stable for discharge to home.   The patient's discharge medications include an ARB (losartan) and beta blocker (metoprolol).   Physical Exam: Vitals:   02/01/18 1900 02/02/18 0032 02/02/18 0513 02/02/18 0823  BP: (!) 127/93 130/80 139/86 116/72  Pulse: 86 83 84 90  Resp: 18 18 18    Temp: 98.6 F (37 C) 98.6 F (37 C) 98.6 F (37 C)   TempSrc: Oral Oral Oral   SpO2: 96% 96% 98%   Weight:   203 lb (92.1 kg)   Height:        GEN- The patient is well appearing, alert and oriented x 3 today.   HEENT: normocephalic, atraumatic; sclera clear, conjunctiva pink; hearing intact; oropharynx clear Lungs- CTA b/l, normal work of breathing.  No wheezes, rales, rhonchi Heart- RRR, no murmurs, rubs or gallops, PMI not laterally displaced GI- soft, non-tender, non-distended Extremities- no clubbing, cyanosis, or edema MS- no significant deformity or atrophy Skin- warm and dry, no rash or lesion, left chest without hematoma/ecchymosis Psych- euthymic mood, full affect Neuro- no gross defecits  Labs:   Lab Results  Component Value Date   WBC 9.4 01/27/2018   HGB 13.3 01/27/2018   HCT 40.2 01/27/2018   MCV 88.9 01/27/2018   PLT 211 01/27/2018    Recent Labs  Lab 01/27/18 1533  NA 139  K 4.0  CL 102  CO2 26  BUN 29*  CREATININE 1.49*  CALCIUM 9.3  GLUCOSE 145*    Discharge Medications:  Allergies as of 02/02/2018      Reactions   Atorvastatin Other (See Comments)   Myalgias   Other    Seasonal allergies  - has to use inhaler      Medication List    TAKE these medications   albuterol 108 (90 Base) MCG/ACT inhaler Commonly known as:  PROVENTIL HFA;VENTOLIN HFA Inhale 2 puffs into the lungs every 6 (six)  hours as needed for wheezing or shortness of breath.   amLODipine 10 MG tablet Commonly known as:  NORVASC Take 1 tablet (10 mg total) by mouth daily.   ASPIRIN ADULT LOW STRENGTH 81 MG EC tablet Generic drug:  aspirin Take 81 mg by mouth daily.   budesonide-formoterol 80-4.5 MCG/ACT inhaler Commonly known as:  SYMBICORT Inhale 2 puffs into the lungs 2 (two) times daily. What changed:    when to take this  reasons to take this   furosemide 40 MG tablet Commonly known as:  LASIX Take 1 tablet (40 mg total) by mouth 2 (two) times daily.   insulin regular 100 units/mL injection Commonly known as:  NOVOLIN R,HUMULIN R Inject 10 Units 3 (three) times daily before meals into the skin.   ipratropium-albuterol 0.5-2.5 (3) MG/3ML Soln Commonly known as:  DUONEB Take 3 mLs by nebulization every 6 (six) hours as needed. What changed:  reasons to take this   LANTUS SOLOSTAR 100 UNIT/ML Solostar Pen Generic drug:  Insulin Glargine Inject 50 Units daily at 10 pm into the skin. Everynight   losartan 25 MG tablet Commonly known as:  COZAAR Take 0.5 tablets (12.5 mg total) by mouth daily.   metoprolol succinate 50 MG 24 hr tablet Commonly known as:  TOPROL-XL take 1 tablet once daily with food   nitroGLYCERIN 0.4 MG SL tablet Commonly known as:  NITROSTAT Place 1 tablet (0.4 mg total) under the tongue every 5 (five) minutes as needed for chest pain.   potassium chloride SA 20 MEQ tablet Commonly known as:  KLOR-CON M20 Take 1 tablet (20 mEq total) by mouth daily.   spironolactone 25 MG tablet Commonly known as:  ALDACTONE Take 1 tablet (25 mg total) by mouth daily.   tiotropium 18 MCG inhalation capsule Commonly known as:  SPIRIVA HANDIHALER inhale the contents of one capsule in the handihaler once daily What changed:    how much to take  how to take this  when to take this  reasons to take this  additional instructions       Disposition:  Home  Discharge  Instructions    Diet - low sodium heart healthy   Complete by:  As directed    Increase activity slowly   Complete by:  As directed      Follow-up Information    [facility] Follow up on 02/15/2018.   Specialty:  Cardiology Why:  3:00PM, wound check visit Contact information: [address] [place] [address]       Evans Lance, MD Follow up on 05/02/2018.   Specialty:  Cardiology Why:  3:00PM Contact information: [address] [address] [phone]           Duration of Discharge Encounter: Greater than 30 minutes including physician time.  Venetia Night, PA-C 02/02/2018 10:31 AM   EP  attending  Patient seen and examined.  Agree with the findings as noted above.  ICD interrogation under my direct supervision demonstrates normal single-chamber ICD function.  Chest x-ray demonstrates no evidence of pneumothorax and his lead is in proper position.  He will be discharged home with usual follow-up.  Crissie Sickles, MD

## 2018-02-02 ENCOUNTER — Ambulatory Visit (HOSPITAL_COMMUNITY): Payer: BLUE CROSS/BLUE SHIELD

## 2018-02-02 ENCOUNTER — Telehealth: Payer: Self-pay | Admitting: Family Medicine

## 2018-02-02 ENCOUNTER — Ambulatory Visit: Payer: BLUE CROSS/BLUE SHIELD | Admitting: Cardiology

## 2018-02-02 ENCOUNTER — Telehealth: Payer: Self-pay | Admitting: Internal Medicine

## 2018-02-02 DIAGNOSIS — Z7982 Long term (current) use of aspirin: Secondary | ICD-10-CM | POA: Diagnosis not present

## 2018-02-02 DIAGNOSIS — I5022 Chronic systolic (congestive) heart failure: Secondary | ICD-10-CM | POA: Diagnosis not present

## 2018-02-02 DIAGNOSIS — Z8249 Family history of ischemic heart disease and other diseases of the circulatory system: Secondary | ICD-10-CM | POA: Diagnosis not present

## 2018-02-02 DIAGNOSIS — E669 Obesity, unspecified: Secondary | ICD-10-CM | POA: Diagnosis not present

## 2018-02-02 DIAGNOSIS — Z95810 Presence of automatic (implantable) cardiac defibrillator: Secondary | ICD-10-CM | POA: Diagnosis not present

## 2018-02-02 DIAGNOSIS — I255 Ischemic cardiomyopathy: Secondary | ICD-10-CM | POA: Diagnosis not present

## 2018-02-02 DIAGNOSIS — I110 Hypertensive heart disease with heart failure: Secondary | ICD-10-CM | POA: Diagnosis not present

## 2018-02-02 DIAGNOSIS — I340 Nonrheumatic mitral (valve) insufficiency: Secondary | ICD-10-CM | POA: Diagnosis not present

## 2018-02-02 DIAGNOSIS — Z794 Long term (current) use of insulin: Secondary | ICD-10-CM | POA: Diagnosis not present

## 2018-02-02 DIAGNOSIS — Z006 Encounter for examination for normal comparison and control in clinical research program: Secondary | ICD-10-CM | POA: Diagnosis not present

## 2018-02-02 DIAGNOSIS — I252 Old myocardial infarction: Secondary | ICD-10-CM | POA: Diagnosis not present

## 2018-02-02 DIAGNOSIS — E119 Type 2 diabetes mellitus without complications: Secondary | ICD-10-CM | POA: Diagnosis not present

## 2018-02-02 DIAGNOSIS — Z7951 Long term (current) use of inhaled steroids: Secondary | ICD-10-CM | POA: Diagnosis not present

## 2018-02-02 DIAGNOSIS — E782 Mixed hyperlipidemia: Secondary | ICD-10-CM | POA: Diagnosis not present

## 2018-02-02 DIAGNOSIS — I2510 Atherosclerotic heart disease of native coronary artery without angina pectoris: Secondary | ICD-10-CM | POA: Diagnosis not present

## 2018-02-02 DIAGNOSIS — J449 Chronic obstructive pulmonary disease, unspecified: Secondary | ICD-10-CM | POA: Diagnosis not present

## 2018-02-02 DIAGNOSIS — Z79899 Other long term (current) drug therapy: Secondary | ICD-10-CM | POA: Diagnosis not present

## 2018-02-02 LAB — GLUCOSE, CAPILLARY
Glucose-Capillary: 198 mg/dL — ABNORMAL HIGH (ref 65–99)
Glucose-Capillary: 236 mg/dL — ABNORMAL HIGH (ref 65–99)

## 2018-02-02 MED ORDER — ATORVASTATIN CALCIUM 40 MG PO TABS: 40.0000 mg | ORAL_TABLET | Freq: Every day | ORAL | 0 refills | Status: DC

## 2018-02-02 MED FILL — Cefazolin Sodium-Dextrose IV Solution 2 GM/100ML-4%: INTRAVENOUS | Qty: 100 | Status: AC

## 2018-02-02 MED FILL — Gentamicin Sulfate Inj 40 MG/ML: INTRAMUSCULAR | Qty: 80 | Status: AC

## 2018-02-02 NOTE — Telephone Encounter (Signed)
atorvastin med is making his legs hurt,  Can you call him regarding this.

## 2018-02-02 NOTE — Progress Notes (Signed)
Reviewed discharge instructions with patient and his family.  Both stated understanding.  Patient confirmed he has personal belongings of "Latitude Communicator" equipment for his ICD, cell phone, glasses, and clothing that to take home with him.  No voiced complaints.

## 2018-02-02 NOTE — Progress Notes (Addendum)
Inpatient Diabetes Program Recommendations  AACE/ADA: New Consensus Statement on Inpatient Glycemic Control (2015)  Target Ranges:  Prepandial:   less than 140 mg/dL      Peak postprandial:   less than 180 mg/dL (1-2 hours)      Critically ill patients:  140 - 180 mg/dL   Lab Results  Component Value Date   GLUCAP 198 (H) 02/02/2018   HGBA1C 10.8 (H) 09/08/2017    Review of Glycemic Control Results for Antonio Cobb, Antonio Cobb (MRN [id number]) as of 02/02/2018 09:40  Ref. Range 02/01/2018 10:00 02/01/2018 16:36 02/01/2018 21:28 02/02/2018 07:51  Glucose-Capillary Latest Ref Range: 65 - 99 mg/dL 217 (H) 203 (H) 153 (H) 198 (H)   Diabetes history: Type 2 DM, Per Dr Dorris Fetch 08/2017 Outpatient Diabetes medications: Lantus 50 Units QHS, Novolog 10 Units TIDAC Current orders for Inpatient glycemic control:  Lantus 50 Units QHS, Novolog 10 Units [facility]  Inpatient Diabetes Program Recommendations:    Noted patient did not receive Lantus 50 Units QHS on 02/01/18, MAR showed patient refused. Will plan to touch base with patient today.  Also, last A1C 10.8 % on 09/08/17. Consider repeating A1C.  Addendum: Spoke with patient at length about diabetes and home management. Patient confirms that he was taking Lantus 50 units QHS, and Novolog 10 Units TID. Patient experiences hypoglycemia every other day. We discussed at length how to manage hypoglycemia and discuss the importance of calling his MD with repeated blood sugars <70 mg/dL. Patient would grab a 24 oz [facility] to get the BS back and felt bad the rest of the day. Explained the importance of managing hypo and adjusting insulin based on body's needs. Patient had just started a new exercise routine and now s/p ICD placement, may expect additional lows. Discussed additional information for sick day management and hyperglycemia. Reviewed patient's last A1c on 09/08/17 of 10.8%. Explained what a A1c is and what it measures. Also reviewed goal A1c with patient,  importance of good glucose control @ home, and blood sugar goals. Patient felt that this measure has significantly changed. Recommending repeating A1C, however, patient in the process of discharge. Patient reports to be checking blood sugars 3 times/day. Encouraged to follow up and make an appt with Dr Dorris Fetch to repeat A1C and in the event he feels that insulin needs have changed.   RN last night did not give Lantus 50 units QHS. Felt that patient would have a low.   Thanks, Bronson Curb, MSN, RNC-OB Diabetes Coordinator [phone] (8a-5p)

## 2018-02-02 NOTE — Progress Notes (Deleted)
Clinical Summary Mr. Sailors is a 57 y.o.male   Past Medical History:  Diagnosis Date  . Alcohol abuse   . CAD (coronary artery disease)    DES to circumflex June 2013  . Cardiomyopathy (HCC)    LVEF 30-35%  . COPD (chronic obstructive pulmonary disease) ([place])   . Diabetes mellitus, type 2 ([place])   . Essential hypertension   . Mitral regurgitation    Moderate  . Mixed hyperlipidemia   . Myocardial infarction ([place]) 05/23/2012   Inferolateral STEMI  . Obesity      Allergies  Allergen Reactions  . Atorvastatin Other (See Comments)    Myalgias  . Other     Seasonal allergies  - has to use inhaler     No current facility-administered medications for this visit.    Current Outpatient Medications  Medication Sig Dispense Refill  . atorvastatin (LIPITOR) 40 MG tablet Take 1 tablet (40 mg total) by mouth daily. 90 tablet 0   Facility-Administered Medications Ordered in Other Visits  Medication Dose Route Frequency Provider Last Rate Last Dose  . acetaminophen (TYLENOL) tablet 325-650 mg  325-650 mg Oral Q4H PRN Evans Lance, MD      . albuterol (PROVENTIL) (2.5 MG/3ML) 0.083% nebulizer solution 2.5 mg  2.5 mg Inhalation Q6H PRN Evans Lance, MD      . amLODipine (NORVASC) tablet 10 mg  10 mg Oral Daily Evans Lance, MD   10 mg at 02/02/18 0160  . aspirin EC tablet 81 mg  81 mg Oral Daily Evans Lance, MD   81 mg at 02/02/18 0827  . atorvastatin (LIPITOR) tablet 40 mg  40 mg Oral Daily Evans Lance, MD   40 mg at 02/01/18 1456  . docusate sodium (COLACE) capsule 100 mg  100 mg Oral BID Evans Lance, MD   100 mg at 02/02/18 0827  . furosemide (LASIX) tablet 40 mg  40 mg Oral BID Evans Lance, MD   40 mg at 02/02/18 1093  . insulin aspart (novoLOG) injection 10 Units  10 Units Subcutaneous TID WC Evans Lance, MD   10 Units at 02/02/18 [phone]  . insulin glargine (LANTUS) injection 50 Units  50 Units Subcutaneous Q2200 Evans Lance, MD      .  losartan (COZAAR) tablet 12.5 mg  12.5 mg Oral Daily Evans Lance, MD   12.5 mg at 02/02/18 7322  . metoprolol succinate (TOPROL-XL) 24 hr tablet 25 mg  25 mg Oral Daily Evans Lance, MD   25 mg at 02/02/18 0826  . nitroGLYCERIN (NITROSTAT) SL tablet 0.4 mg  0.4 mg Sublingual Q5 min PRN Evans Lance, MD      . ondansetron [facility]) injection 4 mg  4 mg Intravenous Q6H PRN Evans Lance, MD      . potassium chloride SA (K-DUR,KLOR-CON) CR tablet 20 mEq  20 mEq Oral Daily Evans Lance, MD   20 mEq at 02/02/18 0826  . spironolactone (ALDACTONE) tablet 25 mg  25 mg Oral Daily Evans Lance, MD   25 mg at 02/02/18 0254  . temazepam (RESTORIL) capsule 7.5 mg  7.5 mg Oral QHS PRN Evans Lance, MD         Past Surgical History:  Procedure Laterality Date  . CARDIAC CATHETERIZATION  2 yrs ago  . CARDIAC CATHETERIZATION N/A 05/15/2016   Procedure: Right/Left Heart Cath and Coronary Angiography;  Surgeon: [name], MD;  Location: [facility];  Service: Cardiovascular;  Laterality: N/A;  . CORONARY STENT PLACEMENT  05/23/12  . ICD IMPLANT N/A 02/01/2018   Procedure: ICD IMPLANT;  Surgeon: Evans Lance, MD;  Location: [facility];  Service: Cardiovascular;  Laterality: N/A;  . LEFT HEART CATHETERIZATION WITH CORONARY ANGIOGRAM N/A 05/23/2012   Procedure: LEFT HEART CATHETERIZATION WITH CORONARY ANGIOGRAM;  Surgeon: [name], MD;  Location[facility] CATH LAB;  Service: Cardiovascular;  Laterality: N/A;  . None    . PERCUTANEOUS CORONARY STENT INTERVENTION (PCI-S) N/A 05/23/2012   Procedure: PERCUTANEOUS CORONARY STENT INTERVENTION (PCI-S);  Surgeon: [name], MD;  Location: [facility] CATH LAB;  Service: Cardiovascular;  Laterality: N/A;  . POLYPECTOMY  10/16/2011   Procedure: POLYPECTOMY;  Surgeon: [name], MD;  Location: [facility];  Service: Endoscopy;;  Polypoid Lesion, Transverse and Sigmoid Colon     Allergies  Allergen Reactions  . Atorvastatin Other  (See Comments)    Myalgias  . Other     Seasonal allergies  - has to use inhaler      Family History  Problem Relation Age of Onset  . Emphysema Mother   . Heart attack Mother   . Hypertension Mother   . Diabetes Mother   . Heart disease Mother   . COPD Mother   . Emphysema Father   . COPD Father   . Stroke Sister   . Asthma Sister   . Colon cancer Neg Hx   . Anesthesia problems Neg Hx   . Hypotension Neg Hx   . Malignant hyperthermia Neg Hx   . Pseudochol deficiency Neg Hx      Social History Mr. Dunlow reports that he quit smoking about 20 months ago. His smoking use included cigars. He has a 7.50 pack-year smoking history. he has never used smokeless tobacco. Mr. Elmquist reports that he drinks alcohol.   Review of Systems CONSTITUTIONAL: No weight loss, fever, chills, weakness or fatigue.  HEENT: Eyes: No visual loss, blurred vision, double vision or yellow sclerae.No hearing loss, sneezing, congestion, runny nose or sore throat.  SKIN: No rash or itching.  CARDIOVASCULAR:  RESPIRATORY: No shortness of breath, cough or sputum.  GASTROINTESTINAL: No anorexia, nausea, vomiting or diarrhea. No abdominal pain or blood.  GENITOURINARY: No burning on urination, no polyuria NEUROLOGICAL: No headache, dizziness, syncope, paralysis, ataxia, numbness or tingling in the extremities. No change in bowel or bladder control.  MUSCULOSKELETAL: No muscle, back pain, joint pain or stiffness.  LYMPHATICS: No enlarged nodes. No history of splenectomy.  PSYCHIATRIC: No history of depression or anxiety.  ENDOCRINOLOGIC: No reports of sweating, cold or heat intolerance. No polyuria or polydipsia.  [name]   Physical Examination There were no vitals filed for this visit. There were no vitals filed for this visit.  Gen: resting comfortably, no acute distress HEENT: no scleral icterus, pupils equal round and reactive, no palptable cervical adenopathy,  CV Resp: Clear to auscultation  bilaterally GI: abdomen is soft, non-tender, non-distended, normal bowel sounds, no hepatosplenomegaly MSK: extremities are warm, no edema.  Skin: warm, no rash Neuro:  no focal deficits Psych: appropriate affect   Diagnostic Studies     Assessment and Plan        Arnoldo Lenis, M.D., F.A.C.C.

## 2018-02-02 NOTE — Telephone Encounter (Signed)
Pt need work note from 2.19.19 til 2.26.19 for work. Please call pt to let him know.

## 2018-02-02 NOTE — Progress Notes (Addendum)
Pt discharge instructions reviewed with Butch Penny, RN. Pt belongings with pt. Pt is not in distress. Pt discharged via wheelchair. Pt's wife is driving him home.

## 2018-02-03 NOTE — Telephone Encounter (Signed)
Called and left message that he missed his recent appt and he needed to call back to reschedule to address his concerns

## 2018-02-04 NOTE — Telephone Encounter (Signed)
Ok per Dr. Lovena Le to give extra letter for wife.

## 2018-02-04 NOTE — Telephone Encounter (Signed)
Ok per Dr. Lovena Le.  Letter written.  Notified wife letter created.  Wife also requesting letter.  Will check with Dr. Lovena Le.

## 2018-02-14 ENCOUNTER — Ambulatory Visit (INDEPENDENT_AMBULATORY_CARE_PROVIDER_SITE_OTHER): Payer: BLUE CROSS/BLUE SHIELD | Admitting: *Deleted

## 2018-02-14 DIAGNOSIS — Z95810 Presence of automatic (implantable) cardiac defibrillator: Secondary | ICD-10-CM | POA: Diagnosis not present

## 2018-02-14 DIAGNOSIS — I428 Other cardiomyopathies: Secondary | ICD-10-CM

## 2018-02-14 LAB — CUP PACEART INCLINIC DEVICE CHECK
Brady Statistic RV Percent Paced: 0 %
HIGH POWER IMPEDANCE MEASURED VALUE: 69 Ohm
HighPow Impedance: 65 Ohm
Implantable Lead Implant Date: 20190219
Implantable Lead Location: 753860
Implantable Lead Serial Number: 438538
Implantable Pulse Generator Implant Date: 20190219
Lead Channel Impedance Value: 612 Ohm
Lead Channel Pacing Threshold Amplitude: 0.6 V
Lead Channel Pacing Threshold Pulse Width: 0.4 ms
Lead Channel Setting Pacing Amplitude: 3.5 V
Lead Channel Setting Pacing Pulse Width: 0.4 ms
Lead Channel Setting Sensing Sensitivity: 0.5 mV
MDC IDC MSMT LEADCHNL RV SENSING INTR AMPL: 25 mV — AB
MDC IDC SESS DTM: 20190304050000
Pulse Gen Serial Number: 248173

## 2018-02-14 NOTE — Progress Notes (Signed)
Wound check appointment. Steri-strips removed. Wound without redness or edema. Incision edges approximated, wound well healed. Normal device function. Thresholds, sensing, and impedances consistent with implant measurements. Device programmed at 3.5V for extra safety margin until 3 month visit. Histogram distribution appropriate for patient and level of activity. No ventricular arrhythmias noted. Patient educated about wound care, arm mobility, lifting restrictions, shock plan, and Latitude monitor. ROV with GT on 05/02/18.

## 2018-02-15 ENCOUNTER — Ambulatory Visit: Payer: BLUE CROSS/BLUE SHIELD

## 2018-02-15 DIAGNOSIS — I10 Essential (primary) hypertension: Secondary | ICD-10-CM | POA: Diagnosis not present

## 2018-02-15 DIAGNOSIS — E1065 Type 1 diabetes mellitus with hyperglycemia: Secondary | ICD-10-CM | POA: Diagnosis not present

## 2018-02-15 DIAGNOSIS — E785 Hyperlipidemia, unspecified: Secondary | ICD-10-CM | POA: Diagnosis not present

## 2018-02-15 DIAGNOSIS — Z23 Encounter for immunization: Secondary | ICD-10-CM | POA: Diagnosis not present

## 2018-02-21 ENCOUNTER — Encounter: Payer: Self-pay | Admitting: Family Medicine

## 2018-02-21 ENCOUNTER — Ambulatory Visit (INDEPENDENT_AMBULATORY_CARE_PROVIDER_SITE_OTHER): Payer: BLUE CROSS/BLUE SHIELD | Admitting: Family Medicine

## 2018-02-21 VITALS — BP 132/80 | HR 95 | Resp 16 | Ht 69.0 in | Wt 214.0 lb

## 2018-02-21 DIAGNOSIS — E1065 Type 1 diabetes mellitus with hyperglycemia: Secondary | ICD-10-CM | POA: Diagnosis not present

## 2018-02-21 DIAGNOSIS — I10 Essential (primary) hypertension: Secondary | ICD-10-CM

## 2018-02-21 DIAGNOSIS — F1010 Alcohol abuse, uncomplicated: Secondary | ICD-10-CM

## 2018-02-21 DIAGNOSIS — E782 Mixed hyperlipidemia: Secondary | ICD-10-CM

## 2018-02-21 DIAGNOSIS — Z125 Encounter for screening for malignant neoplasm of prostate: Secondary | ICD-10-CM | POA: Diagnosis not present

## 2018-02-21 DIAGNOSIS — M791 Myalgia, unspecified site: Secondary | ICD-10-CM | POA: Diagnosis not present

## 2018-02-21 DIAGNOSIS — Z91199 Patient's noncompliance with other medical treatment and regimen due to unspecified reason: Secondary | ICD-10-CM

## 2018-02-21 DIAGNOSIS — Z9119 Patient's noncompliance with other medical treatment and regimen: Secondary | ICD-10-CM | POA: Diagnosis not present

## 2018-02-21 DIAGNOSIS — I5022 Chronic systolic (congestive) heart failure: Secondary | ICD-10-CM

## 2018-02-21 NOTE — Patient Instructions (Addendum)
Physical exam in 4 months, call if you need me sooner  Good foot exam, but need to examined every day  Fasting lipid, cmp and eGFR, hBA1c , pSA in hte morning also CK  Need to stop drinking 1 It is important that you exercise regularly at least 30 minutes 5 times a week. If you develop chest pain, have severe difficulty breathing, or feel very tired, stop exercising immediately and seek medical attention   I do not believe the pain in your leg is from the medication and am checking for this  It is important that you exercise regularly at least 30 minutes 5 times a week. If you develop chest pain, have severe difficulty breathing, or feel very tired, stop exercising immediately and seek medical attention  Thank you  for choosing [facility]. We consider it a privelige to serve you.  Delivering excellent health care in a caring and  compassionate way is our goal.  Partnering with you,  so that together we can achieve this goal is our strategy.

## 2018-02-22 ENCOUNTER — Telehealth: Payer: Self-pay | Admitting: Family Medicine

## 2018-02-22 DIAGNOSIS — M791 Myalgia, unspecified site: Secondary | ICD-10-CM | POA: Diagnosis not present

## 2018-02-22 DIAGNOSIS — E1065 Type 1 diabetes mellitus with hyperglycemia: Secondary | ICD-10-CM | POA: Diagnosis not present

## 2018-02-22 DIAGNOSIS — Z125 Encounter for screening for malignant neoplasm of prostate: Secondary | ICD-10-CM | POA: Diagnosis not present

## 2018-02-22 NOTE — Telephone Encounter (Signed)
Filled out and given to the dr to sign

## 2018-02-22 NOTE — Telephone Encounter (Signed)
Patient left message on nurse line requesting another temporary handicap tag.

## 2018-02-22 NOTE — Progress Notes (Signed)
Cardiology Office Note    Date:  02/23/2018   ID:  Antonio Cobb, DOB 02/24/1961, MRN [id number]  PCP:  [name], MD  Cardiologist: [name], MD   EP: Dr. Lovena Le  Chief Complaint  Patient presents with  . Follow-up    3 month visit    History of Present Illness:    Antonio Cobb is a 57 y.o. male with past medical history of CAD (s/p inferolateral STEMI in 05/2012 with DES to LCx, patent stent by cath in 05/2016 with moderate RCA and LAD disease), chronic combined systolic and diastolic CHF (EF 41-93% by echo in 09/2016), HTN, HLD, IDDM and COPD who presents to the office today for 75-month follow-up.   He was last examined by Dr. Harl Bowie in 10/2017 reported doing well from a cardiac perspective at that time.  He was referred to EP for consideration of ICD placement in the setting of his chronic systolic heart failure. Risks and benefits of the procedure were reviewed and he underwent placement of a [employer] ICD on 02/01/2018. He tolerated the procedure well and no immediate complications were noted. CXR the following day showed no evidence of a pneumothorax and he was discharged on 02/02/2018.  In talking with the patient today, he reports overall doing well since his recent ICD placement. He denies any complications regarding the site. No recent chest pain, dyspnea on exertion, orthopnea, PND, or lower extremity edema. He denies any recent palpitations or presyncope.  He does not exercise regularly but reports being active at work and denies any anginal symptoms with this.  He has been prescribed Atorvastatin 40 mg daily for many years but says he is unable to take this regularly secondary to myalgias and usually takes this 1 week at a time then discontinues.  Past Medical History:  Diagnosis Date  . Alcohol abuse   . CAD (coronary artery disease)    a. s/p inferolateral STEMI in 05/2012 with DES to LCx b. patent stent by cath in 05/2016 with  moderate RCA and LAD disease  . Cardiomyopathy ([place])    a. LVEF 30-35% by echo in 09/2016. b. 08/2017: echo showing EF remains reduced at 25-30% c. s/p [employer] ICD placement in 01/2018  . COPD (chronic obstructive pulmonary disease) ([place])   . Diabetes mellitus, type 2 ([place])   . Essential hypertension   . Mitral regurgitation    Moderate  . Mixed hyperlipidemia   . Myocardial infarction ([place]) 05/23/2012   Inferolateral STEMI  . Obesity     Past Surgical History:  Procedure Laterality Date  . CARDIAC CATHETERIZATION  2 yrs ago  . CARDIAC CATHETERIZATION N/A 05/15/2016   Procedure: Right/Left Heart Cath and Coronary Angiography;  Surgeon: [name], MD;  Location: [facility];  Service: Cardiovascular;  Laterality: N/A;  . CORONARY STENT PLACEMENT  05/23/12  . ICD IMPLANT N/A 02/01/2018   Procedure: ICD IMPLANT;  Surgeon: Evans Lance, MD;  Location: [facility];  Service: Cardiovascular;  Laterality: N/A;  . LEFT HEART CATHETERIZATION WITH CORONARY ANGIOGRAM N/A 05/23/2012   Procedure: LEFT HEART CATHETERIZATION WITH CORONARY ANGIOGRAM;  Surgeon: [name], MD;  Location: [facility] C[facility];  Service: Cardiovascular;  Laterality: N/A;  . None    . PERCUTANEOUS CORONARY STENT INTERVENTION (PCI-S) N/A 05/23/2012   Procedure: PERCUTANEOUS CORONARY STENT INTERVENTION (PCI-S);  Surgeon: [name], MD;  Location: [facility] CATH LAB;  Service: Cardiovascular;  Laterality: N/A;  . POLYPECTOMY  10/16/2011  Procedure: POLYPECTOMY;  Surgeon: [name], MD;  Location: [facility];  Service: Endoscopy;;  Polypoid Lesion, Transverse and Sigmoid Colon    Current Medications: Outpatient Medications Prior to Visit  Medication Sig Dispense Refill  . albuterol (PROVENTIL HFA;VENTOLIN HFA) 108 (90 Base) MCG/ACT inhaler Inhale 2 puffs into the lungs every 6 (six) hours as needed for wheezing or shortness of breath. 18 g 0  . aspirin (ASPIRIN ADULT LOW STRENGTH) 81 MG EC tablet  Take 81 mg by mouth daily.      . budesonide-formoterol (SYMBICORT) 80-4.5 MCG/ACT inhaler Inhale 2 puffs into the lungs 2 (two) times daily. 1 Inhaler 3  . Insulin Glargine (LANTUS SOLOSTAR) 100 UNIT/ML Solostar Pen Inject 50 Units daily at 10 pm into the skin. Everynight     . insulin regular (NOVOLIN R,HUMULIN R) 100 units/mL injection Inject 10 Units 3 (three) times daily before meals into the skin.    [name] ipratropium-albuterol (DUONEB) 0.5-2.5 (3) MG/3ML SOLN Take 3 mLs by nebulization every 6 (six) hours as needed. (Patient taking differently: Take 3 mLs by nebulization every 6 (six) hours as needed (for wheezing or shortness of breath). ) 360 mL 0  . nitroGLYCERIN (NITROSTAT) 0.4 MG SL tablet Place 1 tablet (0.4 mg total) under the tongue every 5 (five) minutes as needed for chest pain. 25 tablet 3  . tiotropium (SPIRIVA HANDIHALER) 18 MCG inhalation capsule inhale the contents of one capsule in the handihaler once daily (Patient taking differently: Place 18 mcg into inhaler and inhale daily as needed (SOB). ) 90 capsule 1  . amLODipine (NORVASC) 10 MG tablet Take 1 tablet (10 mg total) by mouth daily. 90 tablet 1  . atorvastatin (LIPITOR) 40 MG tablet Take 1 tablet (40 mg total) by mouth daily. 90 tablet 0  . furosemide (LASIX) 40 MG tablet Take 1 tablet (40 mg total) by mouth 2 (two) times daily. 60 tablet 6  . losartan (COZAAR) 25 MG tablet Take 0.5 tablets (12.5 mg total) by mouth daily. 45 tablet 3  . metoprolol succinate (TOPROL-XL) 50 MG 24 hr tablet take 1 tablet once daily with food 90 tablet 0  . potassium chloride SA (KLOR-CON M20) 20 MEQ tablet Take 1 tablet (20 mEq total) by mouth daily. 90 tablet 1  . spironolactone (ALDACTONE) 25 MG tablet Take 1 tablet (25 mg total) by mouth daily. 90 tablet 1   No facility-administered medications prior to visit.      Allergies:   Atorvastatin and Other   Social History   Socioeconomic History  . Marital status: Married    Spouse name:  None  . Number of children: None  . Years of education: None  . Highest education level: None  Social Needs  . Financial resource strain: None  . Food insecurity - worry: None  . Food insecurity - inability: None  . Transportation needs - medical: None  . Transportation needs - non-medical: None  Occupational History  . Occupation: Therapist, sports: [employer]  Tobacco Use  . Smoking status: Former Smoker    Packs/day: 0.25    Years: 30.00    Pack years: 7.50    Types: Cigars    Last attempt to quit: 05/23/2016    Years since quitting: 1.7  . Smokeless tobacco: Never Used  . Tobacco comment: since early 66s  Substance and Sexual Activity  . Alcohol use: Yes    Alcohol/week: 0.0 oz    Comment: occ  . Drug use:  No  . Sexual activity: None  Other Topics Concern  . None  Social History Narrative  . None     Family History:  The patient's family history includes Asthma in his sister; COPD in his father and mother; Diabetes in his mother; Emphysema in his father and mother; Heart attack in his mother; Heart disease in his mother; Hypertension in his mother; Stroke in his sister.   Review of Systems:   Please see the history of present illness.     General:  No chills, fever, night sweats or weight changes.  Cardiovascular:  No chest pain, dyspnea on exertion, edema, orthopnea, palpitations, paroxysmal nocturnal dyspnea. Dermatological: No rash, lesions/masses Respiratory: No cough, dyspnea Urologic: No hematuria, dysuria MSK: Positive for myalgias (when taking statins).  Abdominal:   No nausea, vomiting, diarrhea, bright red blood per rectum, melena, or hematemesis Neurologic:  No visual changes, wkns, changes in mental status.  All other systems reviewed and are otherwise negative except as noted above.   Physical Exam:    VS:  BP 120/80 (BP Location: Right Arm)   Pulse 88   Ht 5\' 11"  (1.803 m)   Wt 215 lb (97.5 kg)   SpO2 96%   BMI 29.99 kg/m      General: Well developed, well nourished [place] American male appearing in no acute distress. Head: Normocephalic, atraumatic, sclera non-icteric, no xanthomas, nares are without discharge.  Neck: No carotid bruits. JVD not elevated.  Lungs: Respirations regular and unlabored, without wheezes or rales.  Heart: Regular rate and rhythm. No S3 or S4.  No murmur, no rubs, or gallops appreciated. ICD site appears well-healing with no erythema or drainage noted.  Abdomen: Soft, non-tender, non-distended with normoactive bowel sounds. No hepatomegaly. No rebound/guarding. No obvious abdominal masses. Msk:  Strength and tone appear normal for age. No joint deformities or effusions. Extremities: No clubbing or cyanosis. No edema.  Distal pedal pulses are 2+ bilaterally. Neuro: Alert and oriented X 3. Moves all extremities spontaneously. No focal deficits noted. Psych:  Responds to questions appropriately with a normal affect. Skin: No rashes or lesions noted  Wt Readings from Last 3 Encounters:  02/23/18 215 lb (97.5 kg)  02/21/18 214 lb (97.1 kg)  02/02/18 203 lb (92.1 kg)     Studies/Labs Reviewed:   EKG:  EKG is not ordered today.    Recent Labs: 09/06/2017: B Natriuretic Peptide 376.0 01/27/2018: Hemoglobin 13.3; Platelets 211 02/22/2018: ALT 23; BUN 22; Creat 1.17; Potassium 4.4; Sodium 138   Lipid Panel    Component Value Date/Time   CHOL 211 (H) 02/22/2018 0922   TRIG 526 (H) 02/22/2018 0922   HDL 36 (L) 02/22/2018 0922   CHOLHDL 5.9 (H) 02/22/2018 0922   VLDL 27 02/09/2017 1201   LDLCALC  02/22/2018 0922     Comment:     . LDL cholesterol not calculated. Triglyceride levels greater than 400 mg/dL invalidate calculated LDL results. . Reference range: <100 . Desirable range <100 mg/dL for primary prevention;   <70 mg/dL for patients with CHD or diabetic patients  with > or = 2 CHD risk factors. [name] LDL-C is now calculated using the Martin-Hopkins  calculation, which is a  validated novel method providing  better accuracy than the Friedewald equation in the  estimation of LDL-C.  Cresenciano Genre et al. Annamaria Helling. 4235;361(44): 2061-2068  (http://education.[facility].com/faq/FAQ164)    LDLDIRECT TEST NOT PERFORMED 06/19/2011 1230    Additional studies/ records that were reviewed today include:   Echocardiogram: 08/2017  Study Conclusions  - Left ventricle: The cavity size was moderately dilated. Wall   thickness was increased in a pattern of mild LVH. Systolic   function was severely reduced. The estimated ejection fraction   was in the range of 25% to 30%. Diffuse hypokinesis. Features are   consistent with a pseudonormal left ventricular filling pattern,   with concomitant abnormal relaxation and increased filling   pressure (grade 2 diastolic dysfunction). Doppler parameters are   consistent with high ventricular filling pressure. - Regional wall motion abnormality: Akinesis of the mid inferior,   basal-mid inferolateral, and apical lateral myocardium; severe   hypokinesis of the basal inferior myocardium; moderate   hypokinesis of the mid anterolateral myocardium. - Aortic valve: There was trivial regurgitation. - Aorta: Mild aortic root dilatation. - Mitral valve: Calcified annulus. Mildly thickened leaflets .   There was moderate, posteriorly directed regurgitation. - Left atrium: The atrium was severely dilated. - Atrial septum: The septum bowed from left to right, consistent   with increased left atrial pressure. - Tricuspid valve: There was mild regurgitation. - Pulmonary arteries: PA peak pressure: 34 mm Hg (S).  Assessment:    1. Coronary artery disease involving native coronary artery of native heart without angina pectoris   2. Chronic combined systolic and diastolic heart failure ([place])   3. ICD (implantable cardioverter-defibrillator), single, in situ   4. Essential hypertension   5. Mixed hyperlipidemia   6. IDDM (insulin dependent  diabetes mellitus) ([place])      Plan:   In order of problems listed above:  1. CAD - s/p inferolateral STEMI in 05/2012 with DES to LCx and patent stent by cath in 05/2016 with moderate RCA and LAD disease. - he denies any recent chest pain or dyspnea on exertion.  - continue ASA and BB therapy. He has not been taking his statin regularly secondary to myalgias. Recommended dose adjustment as outlined below.   2. Chronic combined systolic and diastolic CHF - Most recent echocardiogram in 08/2017 showed EF remained reduced at 25-30%. - He denies any recent dyspnea on exertion, orthopnea, PND, or lower extremity edema. Appears euvolemic on examination.  - continue Losartan, Toprol-XL, Spironolactone, and Lasix at current dosing.   3. Cardiomyopathy/ ICD Placement - s/p [employer] ICD on 02/01/2018. Site appears well-healing.  - Has scheduled follow-up with Dr. Lovena Le in 2 months.   4. HTN - BP is well-controlled at 120/80 during today's visit.  - continue current medication regimen.   5. HLD - recent FLP showed total cholesterol of 211, HDL 36, Triglycerides 526, and LDL unable to be calculated. He reports not taking Atorvastatin secondary to myalgias. Will reduce dosing from 40mg  daily to 20mg  daily. If able to tolerate this, would recheck FLP and LFT's in 6-8 weeks. If unable to tolerate, consider Crestor 5mg  daily dosing.   6. IDDM - Hgb A1c elevated to 9.3 on most recent check. Followed by PCP.    Medication Adjustments/Labs and Tests Ordered: Current medicines are reviewed at length with the patient today.  Concerns regarding medicines are outlined above.  Medication changes, Labs and Tests ordered today are listed in the Patient Instructions below. Patient Instructions  Medication Instructions:  Your physician recommends that you continue on your current medications as directed. Please refer to the Current Medication list given to you today. Decrease Lipitor to 20 mg  Daily   Labwork: NONE   Testing/Procedures: NONE   Follow-Up: Your physician recommends that you schedule a follow-up appointment with  Dr. Lovena Le  Your physician wants you to follow-up in: 6 Months with Dr. Harl Bowie.  You will receive a reminder letter in the mail two months in advance. If you don't receive a letter, please call our office to schedule the follow-up appointment.   Any Other Special Instructions Will Be Listed Below (If Applicable).   If you need a refill on your cardiac medications before your next appointment, please call your pharmacy. Thank you for choosing [facility]!     Signed, Erma Heritage, PA-C  02/23/2018 5:07 PM    [facility] [address] [address] [place], [place] [address] Phone: [phone]

## 2018-02-23 ENCOUNTER — Encounter: Payer: Self-pay | Admitting: Student

## 2018-02-23 ENCOUNTER — Ambulatory Visit: Payer: BLUE CROSS/BLUE SHIELD | Admitting: Student

## 2018-02-23 VITALS — BP 120/80 | HR 88 | Ht 71.0 in | Wt 215.0 lb

## 2018-02-23 DIAGNOSIS — I5042 Chronic combined systolic (congestive) and diastolic (congestive) heart failure: Secondary | ICD-10-CM | POA: Diagnosis not present

## 2018-02-23 DIAGNOSIS — Z95810 Presence of automatic (implantable) cardiac defibrillator: Secondary | ICD-10-CM | POA: Diagnosis not present

## 2018-02-23 DIAGNOSIS — E119 Type 2 diabetes mellitus without complications: Secondary | ICD-10-CM

## 2018-02-23 DIAGNOSIS — E782 Mixed hyperlipidemia: Secondary | ICD-10-CM

## 2018-02-23 DIAGNOSIS — IMO0001 Reserved for inherently not codable concepts without codable children: Secondary | ICD-10-CM

## 2018-02-23 DIAGNOSIS — I2510 Atherosclerotic heart disease of native coronary artery without angina pectoris: Secondary | ICD-10-CM | POA: Diagnosis not present

## 2018-02-23 DIAGNOSIS — I10 Essential (primary) hypertension: Secondary | ICD-10-CM

## 2018-02-23 DIAGNOSIS — Z794 Long term (current) use of insulin: Secondary | ICD-10-CM

## 2018-02-23 LAB — COMPLETE METABOLIC PANEL WITH GFR
AG RATIO: 1.4 (calc) (ref 1.0–2.5)
ALBUMIN MSPROF: 4 g/dL (ref 3.6–5.1)
ALT: 23 U/L (ref 9–46)
AST: 23 U/L (ref 10–35)
Alkaline phosphatase (APISO): 83 U/L (ref 40–115)
BUN: 22 mg/dL (ref 7–25)
CALCIUM: 9.3 mg/dL (ref 8.6–10.3)
CO2: 29 mmol/L (ref 20–32)
Chloride: 102 mmol/L (ref 98–110)
Creat: 1.17 mg/dL (ref 0.70–1.33)
GFR, EST AFRICAN AMERICAN: 80 mL/min/{1.73_m2} (ref >=60)
GFR, EST NON AFRICAN AMERICAN: 69 mL/min/{1.73_m2} (ref >=60)
Globulin: 2.9 g/dL (calc) (ref 1.9–3.7)
Glucose, Bld: 250 mg/dL — ABNORMAL HIGH (ref 65–99)
POTASSIUM: 4.4 mmol/L (ref 3.5–5.3)
Sodium: 138 mmol/L (ref 135–146)
TOTAL PROTEIN: 6.9 g/dL (ref 6.1–8.1)
Total Bilirubin: 0.9 mg/dL (ref 0.2–1.2)

## 2018-02-23 LAB — LIPID PANEL
Cholesterol: 211 mg/dL — ABNORMAL HIGH (ref <200)
HDL: 36 mg/dL — AB (ref >40)
NON-HDL CHOLESTEROL (CALC): 175 mg/dL — AB (ref <130)
Total CHOL/HDL Ratio: 5.9 (calc) — ABNORMAL HIGH (ref <5.0)
Triglycerides: 526 mg/dL — ABNORMAL HIGH (ref <150)

## 2018-02-23 LAB — HEMOGLOBIN A1C
EAG (MMOL/L): 12.2 (calc)
Hgb A1c MFr Bld: 9.3 % of total Hgb — ABNORMAL HIGH (ref <5.7)
Mean Plasma Glucose: 220 (calc)

## 2018-02-23 LAB — PSA: PSA: 0.4 ng/mL (ref <=4.0)

## 2018-02-23 LAB — CK: Total CK: 231 U/L — ABNORMAL HIGH (ref 44–196)

## 2018-02-23 MED ORDER — METOPROLOL SUCCINATE ER 50 MG PO TB24: ORAL_TABLET | ORAL | 3 refills | Status: DC

## 2018-02-23 MED ORDER — FUROSEMIDE 40 MG PO TABS: 40.0000 mg | ORAL_TABLET | Freq: Two times a day (BID) | ORAL | 3 refills | Status: DC

## 2018-02-23 MED ORDER — ATORVASTATIN CALCIUM 20 MG PO TABS: 20.0000 mg | ORAL_TABLET | Freq: Every day | ORAL | 3 refills | Status: DC

## 2018-02-23 MED ORDER — LOSARTAN POTASSIUM 25 MG PO TABS: 12.5000 mg | ORAL_TABLET | Freq: Every day | ORAL | 3 refills | Status: DC

## 2018-02-23 MED ORDER — POTASSIUM CHLORIDE CRYS ER 20 MEQ PO TBCR: 20.0000 meq | EXTENDED_RELEASE_TABLET | Freq: Every day | ORAL | 3 refills | Status: DC

## 2018-02-23 MED ORDER — AMLODIPINE BESYLATE 10 MG PO TABS: 10.0000 mg | ORAL_TABLET | Freq: Every day | ORAL | 3 refills | Status: DC

## 2018-02-23 MED ORDER — SPIRONOLACTONE 25 MG PO TABS: 25.0000 mg | ORAL_TABLET | Freq: Every day | ORAL | 3 refills | Status: DC

## 2018-02-23 NOTE — Patient Instructions (Signed)
Medication Instructions:  Your physician recommends that you continue on your current medications as directed. Please refer to the Current Medication list given to you today. Decrease Lipitor to 20 mg Daily   Labwork: NONE   Testing/Procedures: NONE   Follow-Up: Your physician recommends that you schedule a follow-up appointment with Dr. Lovena Le  Your physician wants you to follow-up in: 6 Months with Dr. Harl Bowie.  You will receive a reminder letter in the mail two months in advance. If you don't receive a letter, please call our office to schedule the follow-up appointment.    Any Other Special Instructions Will Be Listed Below (If Applicable).     If you need a refill on your cardiac medications before your next appointment, please call your pharmacy. Thank you for choosing [facility]!

## 2018-02-24 ENCOUNTER — Other Ambulatory Visit: Payer: Self-pay | Admitting: Family Medicine

## 2018-02-24 ENCOUNTER — Encounter: Payer: Self-pay | Admitting: Family Medicine

## 2018-02-24 MED ORDER — OMEGA-3-ACID ETHYL ESTERS 1 G PO CAPS: 2.0000 g | ORAL_CAPSULE | Freq: Two times a day (BID) | ORAL | 5 refills | Status: DC

## 2018-02-28 DIAGNOSIS — M791 Myalgia, unspecified site: Secondary | ICD-10-CM | POA: Insufficient documentation

## 2018-02-28 NOTE — Assessment & Plan Note (Signed)
Hyperlipidemia:Low fat diet discussed and encouraged.   Lipid Panel  Lab Results  Component Value Date   CHOL 211 (H) 02/22/2018   HDL 36 (L) 02/22/2018   LDLCALC  02/22/2018     Comment:     . LDL cholesterol not calculated. Triglyceride levels greater than 400 mg/dL invalidate calculated LDL results. . Reference range: <100 . Desirable range <100 mg/dL for primary prevention;   <70 mg/dL for patients with CHD or diabetic patients  with > or = 2 CHD risk factors. [name] LDL-C is now calculated using the Martin-Hopkins  calculation, which is a validated novel method providing  better accuracy than the Friedewald equation in the  estimation of LDL-C.  Cresenciano Genre et al. Annamaria Helling. 2072;182(88): 2061-2068  (http://education.[facility].com/faq/FAQ164)    LDLDIRECT TEST NOT PERFORMED 06/19/2011   TRIG 526 (H) 02/22/2018   CHOLHDL 5.9 (H) 02/22/2018   Markedly elevated, needs to reduce fat in diet, start lovaza

## 2018-02-28 NOTE — Assessment & Plan Note (Signed)
Controlled, no change in medication DASH diet and commitment to daily physical activity for a minimum of 30 minutes discussed and encouraged, as a part of hypertension management. The importance of attaining a healthy weight is also discussed.  BP/Weight 02/23/2018 02/21/2018 02/02/2018 02/01/2018 11/30/2017 10/19/2017 10/16/1623  Systolic BP 469 507 225 - 750 518 335  Diastolic BP 80 80 79 - 80 70 78  Wt. (Lbs) 215 214 203 - 211.4 204 203  BMI 29.99 31.6 - 29.13 31.22 30.13 29.98

## 2018-02-28 NOTE — Assessment & Plan Note (Signed)
Asked if still drinks and he confirms , though reports "less" Unwilling to go to AA, has established alcoholic cardiomyopathy and uncontrolled diabetes, needs to quit

## 2018-02-28 NOTE — Assessment & Plan Note (Addendum)
Ongoing challenge, not keeping appts with endocrinology and not testing blood sugars or following diet, deterioration in blood sugar control Still drinks alcohol and remains in denial about the seriously negative impact it has on his health

## 2018-02-28 NOTE — Assessment & Plan Note (Signed)
Localized to anterior shin in pt who needs statin for vascular protection. Muscle enzyme level checked and is lower than in the past , he is to continue the current statin

## 2018-02-28 NOTE — Assessment & Plan Note (Signed)
[name] is reminded of the importance of commitment to daily physical activity for 30 minutes or more, as able and the need to limit carbohydrate intake to 30 to 60 grams per meal to help with blood sugar control.   The need to take medication as prescribed, test blood sugar as directed, and to call between visits if there is a concern that blood sugar is uncontrolled is also discussed.   [name] is reminded of the importance of daily foot exam, annual eye examination, and good blood sugar, blood pressure and cholesterol control. Uncontrolled, needs to see endo  Diabetic Labs Latest Ref Rng & Units 02/22/2018 01/27/2018 09/09/2017 09/08/2017 09/07/2017  HbA1c <5.7 % of total Hgb 9.3(H) - - 10.8(H) -  Microalbumin Not Estab. ug/mL - - - - -  Micro/Creat Ratio 0.0 - 30.0 mg/g creat - - - - -  Chol <200 mg/dL 211(H) - - - -  HDL >40 mg/dL 36(L) - - - -  Calc LDL <100 mg/dL - - - - -  Triglycerides <150 mg/dL 526(H) - - - -  Creatinine 0.70 - 1.33 mg/dL 1.17 1.49(H) 1.12 1.16 1.39(H)   BP/Weight 02/23/2018 02/21/2018 02/02/2018 02/01/2018 11/30/2017 10/19/2017 49/12/7913  Systolic BP 056 979 480 - 165 537 482  Diastolic BP 80 80 79 - 80 70 78  Wt. (Lbs) 215 214 203 - 211.4 204 203  BMI 29.99 31.6 - 29.13 31.22 30.13 29.98   Foot/eye exam completion dates Latest Ref Rng & Units 02/21/2018 10/28/2016  Eye Exam No Retinopathy - -  Foot Form Completion - Done Done

## 2018-02-28 NOTE — Progress Notes (Signed)
Antonio Cobb     MRN: [id number]      DOB: 03/28/1961   HPI Mr. Leichter is here for follow up and re-evaluation of chronic medical conditions, medication management and review of any available recent lab and radiology data.  Preventive health is updated, specifically  Cancer screening and Immunization.   Questions or concerns regarding consultations or procedures which the PT has had in the interim are  addressed. The PT denies any adverse reactions to current medications since the last visit.  Not testing blood sugar regularly , but feels like overall he is improving Denies polyuria, polydipsia, blurred vision , or hypoglycemic episodes. C/o anterior shin pain Denies alcohol use, and still smokes   ROS Denies recent fever or chills. Denies sinus pressure, nasal congestion, ear pain or sore throat. Denies chest congestion, productive cough or wheezing. Denies chest pains, palpitations and leg swelling Denies abdominal pain, nausea, vomiting,diarrhea or constipation.   Denies dysuria, frequency, hesitancy or incontinence. Denies joint pain, swelling and limitation in mobility. Denies headaches, seizures, numbness, or tingling. Denies depression, anxiety or insomnia. Denies skin break down or rash.   PE  BP 132/80   Pulse 95   Resp 16   Ht 5\' 9"  (1.753 m)   Wt 214 lb (97.1 kg)   SpO2 96%   BMI 31.60 kg/m   Patient alert and oriented and in no cardiopulmonary distress.  HEENT: No facial asymmetry, EOMI,   oropharynx pink and moist.  Neck supple no JVD, no mass.  Chest: Clear to auscultation bilaterally.  CVS: S1, S2 no murmurs, no S3.Regular rate.  ABD: Soft non tender.   Ext: No edema  MS: Adequate ROM spine, shoulders, hips and knees.  Skin: Intact, no ulcerations or rash noted.  Psych: Good eye contact, normal affect. Memory intact not anxious or depressed appearing.  CNS: CN 2-12 intact, power,  normal throughout.no focal deficits  noted.   Assessment & Plan  Essential hypertension Controlled, no change in medication DASH diet and commitment to daily physical activity for a minimum of 30 minutes discussed and encouraged, as a part of hypertension management. The importance of attaining a healthy weight is also discussed.  BP/Weight 02/23/2018 02/21/2018 02/02/2018 02/01/2018 11/30/2017 10/19/2017 69/03/8545  Systolic BP 270 350 093 - 818 299 371  Diastolic BP 80 80 79 - 80 70 78  Wt. (Lbs) 215 214 203 - 211.4 204 203  BMI 29.99 31.6 - 29.13 31.22 30.13 29.98       Mixed hyperlipidemia Hyperlipidemia:Low fat diet discussed and encouraged.   Lipid Panel  Lab Results  Component Value Date   CHOL 211 (H) 02/22/2018   HDL 36 (L) 02/22/2018   LDLCALC  02/22/2018     Comment:     . LDL cholesterol not calculated. Triglyceride levels greater than 400 mg/dL invalidate calculated LDL results. . Reference range: <100 . Desirable range <100 mg/dL for primary prevention;   <70 mg/dL for patients with CHD or diabetic patients  with > or = 2 CHD risk factors. [name] LDL-C is now calculated using the Martin-Hopkins  calculation, which is a validated novel method providing  better accuracy than the Friedewald equation in the  estimation of LDL-C.  Cresenciano Genre et al. Annamaria Helling. 6967;893(81): 2061-2068  (http://education.[facility].com/faq/FAQ164)    LDLDIRECT TEST NOT PERFORMED 06/19/2011   TRIG 526 (H) 02/22/2018   CHOLHDL 5.9 (H) 02/22/2018   Markedly elevated, needs to reduce fat in diet, start [place] history of  noncompliance with medical treatment, presenting hazards to health Ongoing challenge, not keeping appts with endocrinology and not testing blood sugars or following diet, deterioration in blood sugar control Still drinks alcohol and remains in denial about the seriously negative impact it has on his health  Diabetes mellitus, insulin dependent (IDDM), uncontrolled ([place]) Mr. Donaldson is  reminded of the importance of commitment to daily physical activity for 30 minutes or more, as able and the need to limit carbohydrate intake to 30 to 60 grams per meal to help with blood sugar control.   The need to take medication as prescribed, test blood sugar as directed, and to call between visits if there is a concern that blood sugar is uncontrolled is also discussed.   Mr. Ahrendt is reminded of the importance of daily foot exam, annual eye examination, and good blood sugar, blood pressure and cholesterol control. Uncontrolled, needs to see endo  Diabetic Labs Latest Ref Rng & Units 02/22/2018 01/27/2018 09/09/2017 09/08/2017 09/07/2017  HbA1c <5.7 % of total Hgb 9.3(H) - - 10.8(H) -  Microalbumin Not Estab. ug/mL - - - - -  Micro/Creat Ratio 0.0 - 30.0 mg/g creat - - - - -  Chol <200 mg/dL 211(H) - - - -  HDL >40 mg/dL 36(L) - - - -  Calc LDL <100 mg/dL - - - - -  Triglycerides <150 mg/dL 526(H) - - - -  Creatinine 0.70 - 1.33 mg/dL 1.17 1.49(H) 1.12 1.16 1.39(H)   BP/Weight 02/23/2018 02/21/2018 02/02/2018 02/01/2018 11/30/2017 10/19/2017 29/04/1883  Systolic BP 166 063 016 - 010 932 355  Diastolic BP 80 80 79 - 80 70 78  Wt. (Lbs) 215 214 203 - 211.4 204 203  BMI 29.99 31.6 - 29.13 31.22 30.13 29.98   Foot/eye exam completion dates Latest Ref Rng & Units 02/21/2018 10/28/2016  Eye Exam No Retinopathy - -  Foot Form Completion - Done Done        Alcohol abuse Asked if still drinks and he confirms , though reports "less" Unwilling to go to AA, has established alcoholic cardiomyopathy and uncontrolled diabetes, needs to quit  Chronic systolic (congestive) heart failure (HCC) Asymptomatic with no signs of decompensation at visit  Muscle pain Localized to anterior shin in pt who needs statin for vascular protection. Muscle enzyme level checked and is lower than in the past , he is to continue the current statin

## 2018-02-28 NOTE — Assessment & Plan Note (Signed)
Asymptomatic with no signs of decompensation at visit

## 2018-03-31 ENCOUNTER — Other Ambulatory Visit: Payer: Self-pay | Admitting: Family Medicine

## 2018-04-12 ENCOUNTER — Encounter: Payer: Self-pay | Admitting: Internal Medicine

## 2018-05-02 ENCOUNTER — Encounter: Payer: BLUE CROSS/BLUE SHIELD | Admitting: Internal Medicine

## 2018-05-02 DIAGNOSIS — R0989 Other specified symptoms and signs involving the circulatory and respiratory systems: Secondary | ICD-10-CM

## 2018-05-05 ENCOUNTER — Other Ambulatory Visit: Payer: Self-pay | Admitting: Family Medicine

## 2018-05-23 ENCOUNTER — Other Ambulatory Visit: Payer: Self-pay | Admitting: Family Medicine

## 2018-05-26 ENCOUNTER — Ambulatory Visit: Payer: BLUE CROSS/BLUE SHIELD | Admitting: Internal Medicine

## 2018-05-26 ENCOUNTER — Encounter: Payer: Self-pay | Admitting: Internal Medicine

## 2018-05-26 VITALS — BP 132/82 | HR 75 | Ht 71.0 in | Wt 211.0 lb

## 2018-05-26 DIAGNOSIS — I5022 Chronic systolic (congestive) heart failure: Secondary | ICD-10-CM | POA: Diagnosis not present

## 2018-05-26 DIAGNOSIS — Z95810 Presence of automatic (implantable) cardiac defibrillator: Secondary | ICD-10-CM | POA: Diagnosis not present

## 2018-05-26 DIAGNOSIS — I428 Other cardiomyopathies: Secondary | ICD-10-CM | POA: Diagnosis not present

## 2018-05-26 NOTE — Progress Notes (Addendum)
HPI Antonio Cobb returns today for ongoing evaluation and management of his ICD. He is a pleasant 57 yo man with an ICM, s/p MI 2013, who was referred by Dr. Harl Bowie for insertion of an ICD for primary prevention of malignant ventricular arrhythmias. His procedure was carried out uneventfully in February. He has done well in the interim. He denies chest pain or sob. No syncope.   Allergies  Allergen Reactions  . Other     Seasonal allergies  - has to use inhaler     Current Outpatient Medications  Medication Sig Dispense Refill  . amLODipine (NORVASC) 10 MG tablet Take 1 tablet (10 mg total) by mouth daily. 90 tablet 3  . aspirin (ASPIRIN ADULT LOW STRENGTH) 81 MG EC tablet Take 81 mg by mouth daily.      [name] atorvastatin (LIPITOR) 20 MG tablet Take 1 tablet (20 mg total) by mouth daily. 90 tablet 3  . FREESTYLE LITE test strip USE AS DIRECTED FOUR TIMES DAILY TO TEST 100 each 1  . furosemide (LASIX) 40 MG tablet Take 1 tablet (40 mg total) by mouth 2 (two) times daily. 180 tablet 3  . Insulin Glargine (LANTUS SOLOSTAR) 100 UNIT/ML Solostar Pen Inject 50 Units daily at 10 pm into the skin. Everynight     . insulin regular (NOVOLIN R,HUMULIN R) 100 units/mL injection Inject 10 Units 3 (three) times daily before meals into the skin.    [name] losartan (COZAAR) 25 MG tablet Take 0.5 tablets (12.5 mg total) by mouth daily. 45 tablet 3  . metoprolol succinate (TOPROL-XL) 50 MG 24 hr tablet take 1 tablet once daily with food 90 tablet 3  . nitroGLYCERIN (NITROSTAT) 0.4 MG SL tablet Place 1 tablet (0.4 mg total) under the tongue every 5 (five) minutes as needed for chest pain. 25 tablet 3  . potassium chloride SA (KLOR-CON M20) 20 MEQ tablet Take 1 tablet (20 mEq total) by mouth daily. 90 tablet 3  . spironolactone (ALDACTONE) 25 MG tablet Take 1 tablet (25 mg total) by mouth daily. 90 tablet 3  . albuterol (PROVENTIL HFA;VENTOLIN HFA) 108 (90 Base) MCG/ACT inhaler Inhale 2 puffs into the lungs  every 6 (six) hours as needed for wheezing or shortness of breath. (Patient not taking: Reported on 05/26/2018) 18 g 0  . budesonide-formoterol (SYMBICORT) 80-4.5 MCG/ACT inhaler Inhale 2 puffs into the lungs 2 (two) times daily.    [name] ipratropium-albuterol (DUONEB) 0.5-2.5 (3) MG/3ML SOLN Take 3 mLs by nebulization every 6 (six) hours as needed. (Patient not taking: Reported on 05/26/2018) 360 mL 0  . tiotropium (SPIRIVA HANDIHALER) 18 MCG inhalation capsule inhale the contents of one capsule in the handihaler once daily (Patient not taking: Reported on 05/26/2018) 90 capsule 1   No current facility-administered medications for this visit.      Past Medical History:  Diagnosis Date  . Alcohol abuse   . CAD (coronary artery disease)    a. s/p inferolateral STEMI in 05/2012 with DES to LCx b. patent stent by cath in 05/2016 with moderate RCA and LAD disease  . Cardiomyopathy ([place])    a. LVEF 30-35% by echo in 09/2016. b. 08/2017: echo showing EF remains reduced at 25-30% c. s/p [employer] ICD placement in 01/2018  . COPD (chronic obstructive pulmonary disease) ([place])   . Diabetes mellitus, type 2 ([place])   . Essential hypertension   . Mitral regurgitation    Moderate  . Mixed hyperlipidemia   . Myocardial  infarction ([place]) 05/23/2012   Inferolateral STEMI  . Obesity     ROS:   All systems reviewed and negative except as noted in the HPI.   Past Surgical History:  Procedure Laterality Date  . CARDIAC CATHETERIZATION  2 yrs ago  . CARDIAC CATHETERIZATION N/A 05/15/2016   Procedure: Right/Left Heart Cath and Coronary Angiography;  Surgeon: [name], MD;  Location: [place] [facility];  Service: Cardiovascular;  Laterality: N/A;  . CORONARY STENT PLACEMENT  05/23/12  . ICD IMPLANT N/A 02/01/2018   Procedure: ICD IMPLANT;  Surgeon: Evans Lance, MD;  Location: [place] [facility];  Service: Cardiovascular;  Laterality: N/A;  . LEFT HEART CATHETERIZATION WITH CORONARY  ANGIOGRAM N/A 05/23/2012   Procedure: LEFT HEART CATHETERIZATION WITH CORONARY ANGIOGRAM;  Surgeon: [name], MD;  Location: [facility] CATH LAB;  Service: Cardiovascular;  Laterality: N/A;  . None    . PERCUTANEOUS CORONARY STENT INTERVENTION (PCI-S) N/A 05/23/2012   Procedure: PERCUTANEOUS CORONARY STENT INTERVENTION (PCI-S);  Surgeon: [name], MD;  Location: [facility] CATH LAB;  Service: Cardiovascular;  Laterality: N/A;  . POLYPECTOMY  10/16/2011   Procedure: POLYPECTOMY;  Surgeon: [name], MD;  Location: [facility];  Service: Endoscopy;;  Polypoid Lesion, Transverse and Sigmoid Colon     Family History  Problem Relation Age of Onset  . Emphysema Mother   . Heart attack Mother   . Hypertension Mother   . Diabetes Mother   . Heart disease Mother   . COPD Mother   . Emphysema Father   . COPD Father   . Stroke Sister   . Asthma Sister   . Colon cancer Neg Hx   . Anesthesia problems Neg Hx   . Hypotension Neg Hx   . Malignant hyperthermia Neg Hx   . Pseudochol deficiency Neg Hx      Social History   Socioeconomic History  . Marital status: Married    Spouse name: Not on file  . Number of children: Not on file  . Years of education: Not on file  . Highest education level: Not on file  Occupational History  . Occupation: Therapist, sports: [employer]  Social Needs  . Financial resource strain: Not on file  . Food insecurity:    Worry: Not on file    Inability: Not on file  . Transportation needs:    Medical: Not on file    Non-medical: Not on file  Tobacco Use  . Smoking status: Former Smoker    Packs/day: 0.25    Years: 30.00    Pack years: 7.50    Types: Cigars    Last attempt to quit: 05/23/2016    Years since quitting: 2.0  . Smokeless tobacco: Never Used  . Tobacco comment: since early 40s  Substance and Sexual Activity  . Alcohol use: Yes    Alcohol/week: 0.0 oz    Comment: occ  . Drug use: No  . Sexual activity: Not on file  Lifestyle  .  Physical activity:    Days per week: Not on file    Minutes per session: Not on file  . Stress: Not on file  Relationships  . Social connections:    Talks on phone: Not on file    Gets together: Not on file    Attends religious service: Not on file    Active member of club or organization: Not on file    Attends meetings of clubs or organizations: Not on  file    Relationship status: Not on file  . Intimate partner violence:    Fear of current or ex partner: Not on file    Emotionally abused: Not on file    Physically abused: Not on file    Forced sexual activity: Not on file  Other Topics Concern  . Not on file  Social History Narrative  . Not on file     BP 132/82   Pulse 75   Ht 5\' 11"  (1.803 m)   Wt 211 lb (95.7 kg)   SpO2 98%   BMI 29.43 kg/m   Physical Exam:  Well appearing NAD HEENT: Unremarkable Neck:  No JVD, no thyromegally Lymphatics:  No adenopathy Back:  No CVA tenderness Lungs:  Clear HEART:  Regular rate rhythm, no murmurs, no rubs, no clicks Abd:  soft, positive bowel sounds, no organomegally, no rebound, no guarding Ext:  2 plus pulses, no edema, no cyanosis, no clubbing Skin:  No rashes no nodules Neuro:  CN II through XII intact, motor grossly intact  EKG - NSR with LVH  DEVICE  Normal device function.  See PaceArt for details.   Assess/Plan: 1. Chronic systolic heart failure - his symptoms are class 2. He will continue his current meds. 2. ICD -his [employer] ICD is working normally. He has 15 years of battery longevity.  3. HTN - his blood pressure is well controlled. Will follow.  Antonio Cobb.D.

## 2018-05-26 NOTE — Patient Instructions (Addendum)
Medication Instructions:  Your physician recommends that you continue on your current medications as directed. Please refer to the Current Medication list given to you today.  Labwork: None ordered.  Testing/Procedures: None ordered.  Follow-Up: Your physician wants you to follow-up in: 9 months with Dr. Lovena Le.   You will receive a reminder letter in the mail two months in advance. If you don't receive a letter, please call our office to schedule the follow-up appointment.  Remote monitoring is used to monitor your ICD from home. This monitoring reduces the number of office visits required to check your device to one time per year. It allows [place] to keep an eye on the functioning of your device to ensure it is working properly. You are scheduled for a device check from home on 08/01/2018. You may send your transmission at any time that day. If you have a wireless device, the transmission will be sent automatically. After your physician reviews your transmission, you will receive a postcard with your next transmission date.  Any Other Special Instructions Will Be Listed Below (If Applicable).  If you need a refill on your cardiac medications before your next appointment, please call your pharmacy.

## 2018-05-30 LAB — CUP PACEART INCLINIC DEVICE CHECK
Date Time Interrogation Session: 20190613040000
HIGH POWER IMPEDANCE MEASURED VALUE: 75 Ohm
HighPow Impedance: 65 Ohm
Implantable Lead Location: 753860
Implantable Lead Model: 293
Implantable Lead Serial Number: 438538
Lead Channel Pacing Threshold Amplitude: 0.5 V
Lead Channel Sensing Intrinsic Amplitude: 21.3 mV
Lead Channel Setting Pacing Amplitude: 3.5 V
Lead Channel Setting Pacing Pulse Width: 0.4 ms
Lead Channel Setting Sensing Sensitivity: 0.5 mV
MDC IDC LEAD IMPLANT DT: 20190219
MDC IDC MSMT LEADCHNL RV IMPEDANCE VALUE: 703 Ohm
MDC IDC MSMT LEADCHNL RV PACING THRESHOLD PULSEWIDTH: 0.4 ms
MDC IDC PG IMPLANT DT: 20190219
Pulse Gen Serial Number: 248173

## 2018-07-04 ENCOUNTER — Encounter: Payer: BLUE CROSS/BLUE SHIELD | Admitting: Family Medicine

## 2018-07-06 ENCOUNTER — Ambulatory Visit (INDEPENDENT_AMBULATORY_CARE_PROVIDER_SITE_OTHER): Payer: BLUE CROSS/BLUE SHIELD | Admitting: Family Medicine

## 2018-07-06 ENCOUNTER — Encounter: Payer: Self-pay | Admitting: Family Medicine

## 2018-07-06 ENCOUNTER — Other Ambulatory Visit: Payer: Self-pay

## 2018-07-06 VITALS — BP 122/82 | HR 83 | Ht 69.0 in | Wt 203.0 lb

## 2018-07-06 DIAGNOSIS — Z0000 Encounter for general adult medical examination without abnormal findings: Secondary | ICD-10-CM

## 2018-07-06 DIAGNOSIS — E782 Mixed hyperlipidemia: Secondary | ICD-10-CM

## 2018-07-06 DIAGNOSIS — E1159 Type 2 diabetes mellitus with other circulatory complications: Secondary | ICD-10-CM

## 2018-07-06 DIAGNOSIS — E1065 Type 1 diabetes mellitus with hyperglycemia: Secondary | ICD-10-CM

## 2018-07-06 DIAGNOSIS — N5203 Combined arterial insufficiency and corporo-venous occlusive erectile dysfunction: Secondary | ICD-10-CM | POA: Diagnosis not present

## 2018-07-06 DIAGNOSIS — Z720 Tobacco use: Secondary | ICD-10-CM | POA: Diagnosis not present

## 2018-07-06 DIAGNOSIS — I10 Essential (primary) hypertension: Secondary | ICD-10-CM | POA: Diagnosis not present

## 2018-07-06 NOTE — Patient Instructions (Addendum)
F/U in 4 months, calll if you need me before  I will refer you to urology re ED    HBA1C, lipid, cmp and EGFR, and microalb  Today  Three stool cards today  For colon cancer screening  Return asap

## 2018-07-17 ENCOUNTER — Encounter: Payer: Self-pay | Admitting: Family Medicine

## 2018-07-17 NOTE — Assessment & Plan Note (Signed)
C/o erectile dysfunction , with CAD s/p CABG needs management by urology due to severe medial disease and severity of erectile dysfunction. He has chron uncontrolled IDDM' Refer to local urologyu

## 2018-07-17 NOTE — Progress Notes (Signed)
   Antonio Cobb     MRN: [id number]      DOB: [birth year]   HPI: Patient is in for annual physical exam. C/o erectile dysfunctions , went to a "men's clinic" however has decided the cost is exorbitant and not covered by his insuance so requests help from referral to urology.  Needs labs and is neiuther testing blood sugar regularly not keeping endocrinology appointments. Thinks his sugar is improving however Immunization is reviewed , and  updated if needed.    PE; BP 122/82 (BP Location: Left Arm, Patient Position: Sitting, Cuff Size: Normal)   Pulse 83   Ht 5\' 9"  (1.753 m)   Wt 203 lb (92.1 kg)   SpO2 98%   BMI 29.98 kg/m   Pleasant male, alert and oriented x 3, in no cardio-pulmonary distress. Afebrile. HEENT No facial trauma or asymetry. Sinuses non tender. EOMI, pupils equally reactive to light. External ears normal, tympanic membranes clear. Oropharynx moist, no exudate. Neck: supple, no adenopathy,JVD or thyromegaly.No bruits.  Chest: Clear to ascultation bilaterally.No crackles or wheezes. Non tender to palpation  Breast: No asymetry,no masses. No nipple discharge or inversion. No axillary or supraclavicular adenopathy  Cardiovascular system; Heart sounds normal,  S1 and  S2 ,no S3.  No murmur, or thrill. Apical beat not displaced Peripheral pulses normal.  Abdomen: Soft, non tender, no organomegaly or masses. No bruits. Bowel sounds normal. No guarding, tenderness or rebound.    Musculoskeletal exam: Full ROM of spine, hips , shoulders and knees. No deformity ,swelling or crepitus noted. No muscle wasting or atrophy.   Neurologic: Cranial nerves 2 to 12 intact. Power, tone ,sensation and reflexes normal throughout. No disturbance in gait. No tremor.  Skin: Intact, no ulceration, erythema , scaling or rash noted. Pigmentation normal throughout  Psych; Normal mood and affect. Judgement and concentration normal   Assessment & Plan:    Annual physical exam Annual exam as documented. Counseling done  re healthy lifestyle involving commitment to 150 minutes exercise per week, heart healthy diet, and attaining healthy weight.The importance of adequate sleep also discussed.  Immunization and cancer screening needs are specifically addressed at this visit.   Tobacco abuse Asked : confirms currently smokes 10 cigarettes daily Assess: unwilling to set a quit date  Advise : needs to quit has cAD and is s/p CABG Assist: counseled fo 5 mins and literature provided Arrange : f/u in 4 month  ED (erectile dysfunction) C/o erectile dysfunction , with CAD s/p CABG needs management by urology due to severe medial disease and severity of erectile dysfunction. He has chron uncontrolled IDDM' Refer to local urologyu

## 2018-07-17 NOTE — Assessment & Plan Note (Signed)
Annual exam as documented. Counseling done  re healthy lifestyle involving commitment to 150 minutes exercise per week, heart healthy diet, and attaining healthy weight.The importance of adequate sleep also discussed.  Immunization and cancer screening needs are specifically addressed at this visit.  

## 2018-07-17 NOTE — Assessment & Plan Note (Signed)
Asked : confirms currently smokes 10 cigarettes daily Assess: unwilling to set a quit date  Advise : needs to quit has cAD and is s/p CABG Assist: counseled fo 5 mins and literature provided Arrange : f/u in 4 month

## 2018-07-22 ENCOUNTER — Other Ambulatory Visit (INDEPENDENT_AMBULATORY_CARE_PROVIDER_SITE_OTHER): Payer: BLUE CROSS/BLUE SHIELD

## 2018-07-22 DIAGNOSIS — Z1211 Encounter for screening for malignant neoplasm of colon: Secondary | ICD-10-CM

## 2018-07-22 LAB — HEMOCCULT GUIAC POC 1CARD (OFFICE)
Card #2 Fecal Occult Blod, POC: NEGATIVE
FECAL OCCULT BLD: NEGATIVE
Fecal Occult Blood, POC: NEGATIVE

## 2018-08-01 ENCOUNTER — Ambulatory Visit (INDEPENDENT_AMBULATORY_CARE_PROVIDER_SITE_OTHER): Payer: BLUE CROSS/BLUE SHIELD | Admitting: *Deleted

## 2018-08-01 DIAGNOSIS — I428 Other cardiomyopathies: Secondary | ICD-10-CM

## 2018-08-02 NOTE — Progress Notes (Signed)
Remote ICD transmission.   

## 2018-08-09 DIAGNOSIS — E782 Mixed hyperlipidemia: Secondary | ICD-10-CM | POA: Diagnosis not present

## 2018-08-09 DIAGNOSIS — I10 Essential (primary) hypertension: Secondary | ICD-10-CM | POA: Diagnosis not present

## 2018-08-09 DIAGNOSIS — E1159 Type 2 diabetes mellitus with other circulatory complications: Secondary | ICD-10-CM | POA: Diagnosis not present

## 2018-08-09 DIAGNOSIS — E1065 Type 1 diabetes mellitus with hyperglycemia: Secondary | ICD-10-CM | POA: Diagnosis not present

## 2018-08-10 ENCOUNTER — Other Ambulatory Visit: Payer: Self-pay | Admitting: Family Medicine

## 2018-08-10 LAB — COMPLETE METABOLIC PANEL WITH GFR
AG RATIO: 1.3 (calc) (ref 1.0–2.5)
ALT: 21 U/L (ref 9–46)
AST: 22 U/L (ref 10–35)
Albumin: 4.1 g/dL (ref 3.6–5.1)
Alkaline phosphatase (APISO): 81 U/L (ref 40–115)
BILIRUBIN TOTAL: 0.5 mg/dL (ref 0.2–1.2)
BUN: 22 mg/dL (ref 7–25)
CHLORIDE: 103 mmol/L (ref 98–110)
CO2: 28 mmol/L (ref 20–32)
Calcium: 9.8 mg/dL (ref 8.6–10.3)
Creat: 1.25 mg/dL (ref 0.70–1.33)
GFR, EST AFRICAN AMERICAN: 73 mL/min/{1.73_m2} (ref >=60)
GFR, Est Non African American: 63 mL/min/{1.73_m2} (ref >=60)
GLOBULIN: 3.1 g/dL (ref 1.9–3.7)
Glucose, Bld: 252 mg/dL — ABNORMAL HIGH (ref 65–99)
POTASSIUM: 4.8 mmol/L (ref 3.5–5.3)
SODIUM: 137 mmol/L (ref 135–146)
TOTAL PROTEIN: 7.2 g/dL (ref 6.1–8.1)

## 2018-08-10 LAB — MICROALBUMIN, URINE: MICROALB UR: 0.3 mg/dL

## 2018-08-10 LAB — HEMOGLOBIN A1C
HEMOGLOBIN A1C: 10.7 %{Hb} — AB (ref <5.7)
Mean Plasma Glucose: 260 (calc)
eAG (mmol/L): 14.4 (calc)

## 2018-08-10 LAB — LIPID PANEL
CHOL/HDL RATIO: 7.8 (calc) — AB (ref <5.0)
CHOLESTEROL: 233 mg/dL — AB (ref <200)
HDL: 30 mg/dL — AB (ref >40)
NON-HDL CHOLESTEROL (CALC): 203 mg/dL — AB (ref <130)
TRIGLYCERIDES: 1064 mg/dL — AB (ref <150)

## 2018-08-10 MED ORDER — ROSUVASTATIN CALCIUM 40 MG PO TABS: 40.0000 mg | ORAL_TABLET | Freq: Every day | ORAL | 1 refills | Status: DC

## 2018-08-13 ENCOUNTER — Other Ambulatory Visit: Payer: Self-pay | Admitting: Family Medicine

## 2018-09-05 LAB — CUP PACEART REMOTE DEVICE CHECK
Implantable Pulse Generator Implant Date: 20190219
MDC IDC LEAD IMPLANT DT: 20190219
MDC IDC LEAD LOCATION: 753860
MDC IDC LEAD SERIAL: 438538
MDC IDC SESS DTM: 20190923125419
Pulse Gen Serial Number: 248173

## 2018-09-06 ENCOUNTER — Other Ambulatory Visit: Payer: Self-pay

## 2018-09-06 MED ORDER — AMLODIPINE BESYLATE 10 MG PO TABS: 10.0000 mg | ORAL_TABLET | Freq: Every day | ORAL | 3 refills | Status: DC

## 2018-09-08 ENCOUNTER — Telehealth: Payer: Self-pay

## 2018-09-08 DIAGNOSIS — E782 Mixed hyperlipidemia: Secondary | ICD-10-CM

## 2018-09-21 ENCOUNTER — Ambulatory Visit (INDEPENDENT_AMBULATORY_CARE_PROVIDER_SITE_OTHER): Payer: BLUE CROSS/BLUE SHIELD | Admitting: Urology

## 2018-09-21 DIAGNOSIS — N5201 Erectile dysfunction due to arterial insufficiency: Secondary | ICD-10-CM | POA: Diagnosis not present

## 2018-09-28 ENCOUNTER — Ambulatory Visit: Payer: BLUE CROSS/BLUE SHIELD | Admitting: Urology

## 2018-09-29 ENCOUNTER — Other Ambulatory Visit: Payer: Self-pay

## 2018-09-29 DIAGNOSIS — E1165 Type 2 diabetes mellitus with hyperglycemia: Principal | ICD-10-CM

## 2018-09-29 DIAGNOSIS — Z794 Long term (current) use of insulin: Principal | ICD-10-CM

## 2018-09-29 DIAGNOSIS — IMO0001 Reserved for inherently not codable concepts without codable children: Secondary | ICD-10-CM

## 2018-09-29 MED ORDER — ROSUVASTATIN CALCIUM 40 MG PO TABS: 40.0000 mg | ORAL_TABLET | Freq: Every day | ORAL | 0 refills | Status: DC

## 2018-10-04 ENCOUNTER — Telehealth: Payer: Self-pay

## 2018-10-06 NOTE — Telephone Encounter (Signed)
crestor resent

## 2018-10-11 ENCOUNTER — Telehealth: Payer: Self-pay

## 2018-10-11 ENCOUNTER — Telehealth: Payer: Self-pay | Admitting: Family Medicine

## 2018-10-11 ENCOUNTER — Other Ambulatory Visit: Payer: Self-pay | Admitting: Family Medicine

## 2018-10-11 NOTE — Telephone Encounter (Signed)
Pt came in to advise that Atorvastatin 40mg  gives him legs cramps  and 20mg  made him cramp, but not that bad.  He is trying the 20mg  of the above   The rosuvastation 40 mg his legs give out.    I advised the pt since he has been taking the 20mg  Atorvastatin to continue to take it and not stop until he talked to Dr Moshe Cipro if he started cramping again.

## 2018-10-11 NOTE — Telephone Encounter (Signed)
Cholesterol is too high, needs to take whole atorvatsatin pill and reduce fat intake, pls document historically what he is taking as far as atorvastatin dose in his med list also

## 2018-10-11 NOTE — Telephone Encounter (Signed)
States he has been taking the atorvastatin at a half a pill. Could not take the crestor due to side effects

## 2018-10-13 MED ORDER — ATORVASTATIN CALCIUM 40 MG PO TABS: 20.0000 mg | ORAL_TABLET | Freq: Every day | ORAL | 3 refills | Status: DC

## 2018-10-13 NOTE — Telephone Encounter (Signed)
Called pt and message was forwarded to voicemail. Left message

## 2018-10-13 NOTE — Addendum Note (Signed)
Addended by: Eual Fines on: 10/13/2018 01:39 PM   Modules accepted: Orders

## 2018-10-13 NOTE — Telephone Encounter (Signed)
Wife aware and she will tell him to take the whole tablet and contact cardiology about other options if he cannot tolerate it

## 2018-10-14 NOTE — Telephone Encounter (Signed)
Message was sent back from DR for him to take the whole 40mg  tab and I called and made his wife aware and she doesn't know if he will be able to do so but she will tell him

## 2018-10-19 ENCOUNTER — Ambulatory Visit: Payer: BLUE CROSS/BLUE SHIELD | Admitting: Urology

## 2018-10-19 DIAGNOSIS — N5201 Erectile dysfunction due to arterial insufficiency: Secondary | ICD-10-CM | POA: Diagnosis not present

## 2018-10-24 NOTE — Telephone Encounter (Signed)
error 

## 2018-10-31 ENCOUNTER — Ambulatory Visit (INDEPENDENT_AMBULATORY_CARE_PROVIDER_SITE_OTHER): Payer: BLUE CROSS/BLUE SHIELD

## 2018-10-31 DIAGNOSIS — I428 Other cardiomyopathies: Secondary | ICD-10-CM

## 2018-10-31 NOTE — Progress Notes (Signed)
Remote ICD transmission.   

## 2018-11-03 ENCOUNTER — Encounter: Payer: Self-pay | Admitting: Cardiology

## 2018-11-08 ENCOUNTER — Ambulatory Visit: Payer: BLUE CROSS/BLUE SHIELD | Admitting: Family Medicine

## 2018-11-16 ENCOUNTER — Encounter: Payer: Self-pay | Admitting: Family Medicine

## 2018-11-16 ENCOUNTER — Ambulatory Visit (INDEPENDENT_AMBULATORY_CARE_PROVIDER_SITE_OTHER): Payer: BLUE CROSS/BLUE SHIELD | Admitting: Family Medicine

## 2018-11-16 ENCOUNTER — Ambulatory Visit (HOSPITAL_COMMUNITY)
Admission: RE | Admit: 2018-11-16 | Discharge: 2018-11-16 | Disposition: A | Discharge status: Home / Self Care | Payer: BLUE CROSS/BLUE SHIELD | Source: Ambulatory Visit | Attending: Family Medicine | Admitting: Family Medicine

## 2018-11-16 VITALS — BP 120/80 | HR 91 | Temp 99.5°F | Resp 12 | Ht 69.0 in | Wt 216.0 lb

## 2018-11-16 DIAGNOSIS — J209 Acute bronchitis, unspecified: Secondary | ICD-10-CM | POA: Insufficient documentation

## 2018-11-16 DIAGNOSIS — R05 Cough: Secondary | ICD-10-CM | POA: Diagnosis not present

## 2018-11-16 DIAGNOSIS — J440 Chronic obstructive pulmonary disease with acute lower respiratory infection: Secondary | ICD-10-CM | POA: Diagnosis not present

## 2018-11-16 DIAGNOSIS — J0100 Acute maxillary sinusitis, unspecified: Secondary | ICD-10-CM | POA: Diagnosis not present

## 2018-11-16 DIAGNOSIS — IMO0001 Reserved for inherently not codable concepts without codable children: Secondary | ICD-10-CM

## 2018-11-16 DIAGNOSIS — E782 Mixed hyperlipidemia: Secondary | ICD-10-CM | POA: Diagnosis not present

## 2018-11-16 DIAGNOSIS — J040 Acute laryngitis: Secondary | ICD-10-CM

## 2018-11-16 DIAGNOSIS — R0602 Shortness of breath: Secondary | ICD-10-CM | POA: Diagnosis not present

## 2018-11-16 DIAGNOSIS — Z794 Long term (current) use of insulin: Secondary | ICD-10-CM

## 2018-11-16 DIAGNOSIS — E1159 Type 2 diabetes mellitus with other circulatory complications: Secondary | ICD-10-CM

## 2018-11-16 DIAGNOSIS — I10 Essential (primary) hypertension: Secondary | ICD-10-CM

## 2018-11-16 DIAGNOSIS — E1165 Type 2 diabetes mellitus with hyperglycemia: Secondary | ICD-10-CM

## 2018-11-16 MED ORDER — IPRATROPIUM BROMIDE 0.02 % IN SOLN: 0.5000 mg | Freq: Four times a day (QID) | RESPIRATORY_TRACT | 12 refills | Status: DC

## 2018-11-16 MED ORDER — ALBUTEROL SULFATE (2.5 MG/3ML) 0.083% IN NEBU: 2.5000 mg | INHALATION_SOLUTION | Freq: Four times a day (QID) | RESPIRATORY_TRACT | 1 refills | Status: DC | PRN

## 2018-11-16 MED ORDER — ALBUTEROL SULFATE (2.5 MG/3ML) 0.083% IN NEBU
2.5000 mg | INHALATION_SOLUTION | Freq: Once | RESPIRATORY_TRACT | Status: AC
  Administered 2018-11-16: 2.5 mg via RESPIRATORY_TRACT

## 2018-11-16 MED ORDER — CEFTRIAXONE SODIUM 500 MG IJ SOLR
500.0000 mg | Freq: Once | INTRAMUSCULAR | Status: AC
  Administered 2018-11-16: 500 mg via INTRAMUSCULAR

## 2018-11-16 MED ORDER — PENICILLIN V POTASSIUM 500 MG PO TABS: 500.0000 mg | ORAL_TABLET | Freq: Three times a day (TID) | ORAL | 0 refills | Status: DC

## 2018-11-16 MED ORDER — PROMETHAZINE-DM 6.25-15 MG/5ML PO SYRP: ORAL_SOLUTION | ORAL | 0 refills | Status: DC

## 2018-11-16 MED ORDER — IPRATROPIUM BROMIDE 0.02 % IN SOLN
0.5000 mg | Freq: Once | RESPIRATORY_TRACT | Status: AC
  Administered 2018-11-16: 0.5 mg via RESPIRATORY_TRACT

## 2018-11-16 MED ORDER — BENZONATATE 100 MG PO CAPS: 100.0000 mg | ORAL_CAPSULE | Freq: Two times a day (BID) | ORAL | 0 refills | Status: DC | PRN

## 2018-11-16 NOTE — Patient Instructions (Addendum)
F/U in 3.5  months, call if you need me sooner  HBA1C, cmp and eGFR and CBC and TSH today and lipids  Neb treatment , Rocephin injection in office today   You are being treated for acute bronchitis, sinusitis and laryngitis, please rest your voice, do not talk often  Please  get CXR and labs today   Work excuse from 12/05 to return 11/21/2018           Acute Bronchitis, Adult Acute bronchitis is when air tubes (bronchi) in the lungs suddenly get swollen. The condition can make it hard to breathe. It can also cause these symptoms:  A cough.  Coughing up clear, yellow, or green mucus.  Wheezing.  Chest congestion.  Shortness of breath.  A fever.  Body aches.  Chills.  A sore throat.  Follow these instructions at home: Medicines  Take over-the-counter and prescription medicines only as told by your doctor.  If you were prescribed an antibiotic medicine, take it as told by your doctor. Do not stop taking the antibiotic even if you start to feel better. General instructions  Rest.  Drink enough fluids to keep your pee (urine) clear or pale yellow.  Avoid smoking and secondhand smoke. If you smoke and you need help quitting, ask your doctor. Quitting will help your lungs heal faster.  Use an inhaler, cool mist vaporizer, or humidifier as told by your doctor.  Keep all follow-up visits as told by your doctor. This is important. How is this prevented? To lower your risk of getting this condition again:  Wash your hands often with soap and water. If you cannot use soap and water, use hand sanitizer.  Avoid contact with people who have cold symptoms.  Try not to touch your hands to your mouth, nose, or eyes.  Make sure to get the flu shot every year.  Contact a doctor if:  Your symptoms do not get better in 2 weeks. Get help right away if:  You cough up blood.  You have chest pain.  You have very bad shortness of breath.  You become  dehydrated.  You faint (pass out) or keep feeling like you are going to pass out.  You keep throwing up (vomiting).  You have a very bad headache.  Your fever or chills gets worse. This information is not intended to replace advice given to you by your health care provider. Make sure you discuss any questions you have with your health care provider. Document Released: 05/18/2008 Document Revised: 07/08/2016 Document Reviewed: 05/20/2016 Elsevier Interactive Patient Education  [employer].

## 2018-11-16 NOTE — Assessment & Plan Note (Addendum)
Neb treatment in office and Rocephin and CXR, antibiotic, decongestant and cough suppresaant prescribed and work excuse to return 11/21/2018

## 2018-11-16 NOTE — Progress Notes (Signed)
Antonio Cobb     MRN: [id number]      DOB: 06/07/1961   HPI Antonio Cobb is here  6 day h/o worsening head and chest congestion, associated with fever and chills intermittently. Nasal drainage has thickened , and is yellowish green, and at times bloody. Sputum is thick and yellow. C/o bilateral ear pressure, denies hearing loss and sore throat. Increasing fatigue , poor appetitie and sleep disturbed by cough. No improvement with OTC medication.  ROS Denies chest pains, palpitations and leg swelling Denies abdominal pain, nausea, vomiting,diarrhea or constipation.   Denies dysuria, frequency, hesitancy or incontinence. Denies joint pain, swelling and limitation in mobility. Denies headaches, seizures, numbness, or tingling. Denies depression, anxiety or insomnia. Denies skin break down or rash.   PE  BP (!) 142/86 (BP Location: Left Arm, Patient Position: Sitting, Cuff Size: Large)   Pulse 91   Resp 12   Ht 5\' 9"  (1.753 m)   Wt 216 lb 0.6 oz (98 kg)   SpO2 96% Comment: room air  BMI 31.90 kg/m   Patient alert and oriented and in no cardiopulmonary distress.Ill appearing, hoarse  HEENT: No facial asymmetry, EOMI,   oropharynx pink and moist.  Neck supple no JVD, bilateral anterior cervical adenopathy, TM clear Maxillary sinus tenderness  Chest: decreased ar entry scattered crackles and wheezes  CVS: S1, S2 no murmurs, no S3.Regular rate.  ABD: Soft non tender.   Ext: No edema  MS: Adequate ROM spine, shoulders, hips and knees.  Skin: Intact, no ulcerations or rash noted.  Psych: Good eye contact, normal affect. Memory intact not anxious or depressed appearing.  CNS: CN 2-12 intact, power,  normal throughout.no focal deficits noted.   Assessment & Plan  Acute bronchitis with COPD ([name]) Neb treatment in office and Rocephin and CXR, antibiotic, decongestant and cough suppresaant prescribed and work excuse to return 11/21/2018  Acute sinusitis Antibiotic  course prescribed and saline flushes twice daily advised  Acute laryngitis Voice rest and salt water gargles recommended  Diabetes mellitus, insulin dependent (IDDM), uncontrolled (Antonio Cobb) Updated lab needed at/ before next visit. [name] is reminded of the importance of commitment to daily physical activity for 30 minutes or more, as able and the need to limit carbohydrate intake to 30 to 60 grams per meal to help with blood sugar control.   The need to take medication as prescribed, test blood sugar as directed, and to call between visits if there is a concern that blood sugar is uncontrolled is also discussed.   [name] is reminded of the importance of daily foot exam, annual eye examination, and good blood sugar, blood pressure and cholesterol control.  Diabetic Labs Latest Ref Rng & Units 08/09/2018 02/22/2018 01/27/2018 09/09/2017 09/08/2017  HbA1c <5.7 % of total Hgb 10.7(H) 9.3(H) - - 10.8(H)  Microalbumin mg/dL 0.3 - - - -  Micro/Creat Ratio 0.0 - 30.0 mg/g creat - - - - -  Chol <200 mg/dL 233(H) 211(H) - - -  HDL >40 mg/dL 30(L) 36(L) - - -  Calc LDL <100 mg/dL - - - - -  Triglycerides <150 mg/dL 1,064(H) 526(H) - - -  Creatinine 0.70 - 1.33 mg/dL 1.25 1.17 1.49(H) 1.12 1.16   BP/Weight 11/16/2018 07/06/2018 05/26/2018 02/23/2018 02/21/2018 02/02/2018 7/90/2409  Systolic BP 735 329 924 268 341 962 -  Diastolic BP 80 82 82 80 80 79 -  Wt. (Lbs) 216.04 203 211 215 214 203 -  BMI 31.9 29.98 29.43 29.99 31.6 -  29.13   Foot/eye exam completion dates Latest Ref Rng & Units 02/21/2018 10/28/2016  Eye Exam No Retinopathy - -  Foot Form Completion - Done Done        Essential hypertension uncontroled at visit , but has been taking a lot of decongestants recently DASH diet and commitment to daily physical activity for a minimum of 30 minutes discussed and encouraged, as a part of hypertension management. The importance of attaining a healthy weight is also discussed.  BP/Weight  11/16/2018 07/06/2018 05/26/2018 02/23/2018 02/21/2018 02/02/2018 0/13/1438  Systolic BP 887 579 728 206 015 615 -  Diastolic BP 80 82 82 80 80 79 -  Wt. (Lbs) 216.04 203 211 215 214 203 -  BMI 31.9 29.98 29.43 29.99 31.6 - 29.13

## 2018-11-20 ENCOUNTER — Encounter: Payer: Self-pay | Admitting: Family Medicine

## 2018-11-20 DIAGNOSIS — J040 Acute laryngitis: Secondary | ICD-10-CM | POA: Insufficient documentation

## 2018-11-20 DIAGNOSIS — J019 Acute sinusitis, unspecified: Secondary | ICD-10-CM | POA: Insufficient documentation

## 2018-11-20 NOTE — Assessment & Plan Note (Signed)
Antibiotic course prescribed and saline flushes twice daily advised

## 2018-11-20 NOTE — Assessment & Plan Note (Signed)
uncontroled at visit , but has been taking a lot of decongestants recently DASH diet and commitment to daily physical activity for a minimum of 30 minutes discussed and encouraged, as a part of hypertension management. The importance of attaining a healthy weight is also discussed.  BP/Weight 11/16/2018 07/06/2018 05/26/2018 02/23/2018 02/21/2018 02/02/2018 06/21/2956  Systolic BP 473 403 709 643 838 184 -  Diastolic BP 80 82 82 80 80 79 -  Wt. (Lbs) 216.04 203 211 215 214 203 -  BMI 31.9 29.98 29.43 29.99 31.6 - 29.13

## 2018-11-20 NOTE — Assessment & Plan Note (Signed)
Updated lab needed at/ before next visit. Antonio Cobb is reminded of the importance of commitment to daily physical activity for 30 minutes or more, as able and the need to limit carbohydrate intake to 30 to 60 grams per meal to help with blood sugar control.   The need to take medication as prescribed, test blood sugar as directed, and to call between visits if there is a concern that blood sugar is uncontrolled is also discussed.   Antonio Cobb is reminded of the importance of daily foot exam, annual eye examination, and good blood sugar, blood pressure and cholesterol control.  Diabetic Labs Latest Ref Rng & Units 08/09/2018 02/22/2018 01/27/2018 09/09/2017 09/08/2017  HbA1c <5.7 % of total Hgb 10.7(H) 9.3(H) - - 10.8(H)  Microalbumin mg/dL 0.3 - - - -  Micro/Creat Ratio 0.0 - 30.0 mg/g creat - - - - -  Chol <200 mg/dL 233(H) 211(H) - - -  HDL >40 mg/dL 30(L) 36(L) - - -  Calc LDL <100 mg/dL - - - - -  Triglycerides <150 mg/dL 1,064(H) 526(H) - - -  Creatinine 0.70 - 1.33 mg/dL 1.25 1.17 1.49(H) 1.12 1.16   BP/Weight 11/16/2018 07/06/2018 05/26/2018 02/23/2018 02/21/2018 02/02/2018 12/22/3233  Systolic BP 573 220 254 270 623 762 -  Diastolic BP 80 82 82 80 80 79 -  Wt. (Lbs) 216.04 203 211 215 214 203 -  BMI 31.9 29.98 29.43 29.99 31.6 - 29.13   Foot/eye exam completion dates Latest Ref Rng & Units 02/21/2018 10/28/2016  Eye Exam No Retinopathy - -  Foot Form Completion - Done Done

## 2018-11-20 NOTE — Assessment & Plan Note (Signed)
Voice rest and salt water gargles recommended

## 2018-12-27 LAB — CUP PACEART REMOTE DEVICE CHECK
Date Time Interrogation Session: 20200114200753
Implantable Lead Implant Date: 20190219
Implantable Lead Location: 753860
Implantable Lead Model: 293
MDC IDC LEAD SERIAL: 438538
MDC IDC PG IMPLANT DT: 20190219
Pulse Gen Serial Number: 248173

## 2019-01-24 DIAGNOSIS — E782 Mixed hyperlipidemia: Secondary | ICD-10-CM | POA: Diagnosis not present

## 2019-01-24 DIAGNOSIS — E1165 Type 2 diabetes mellitus with hyperglycemia: Secondary | ICD-10-CM | POA: Diagnosis not present

## 2019-01-24 DIAGNOSIS — Z794 Long term (current) use of insulin: Secondary | ICD-10-CM | POA: Diagnosis not present

## 2019-01-24 DIAGNOSIS — I10 Essential (primary) hypertension: Secondary | ICD-10-CM | POA: Diagnosis not present

## 2019-01-25 LAB — COMPLETE METABOLIC PANEL WITH GFR
AG Ratio: 1.5 (calc) (ref 1.0–2.5)
ALT: 27 U/L (ref 9–46)
AST: 23 U/L (ref 10–35)
Albumin: 4.4 g/dL (ref 3.6–5.1)
Alkaline phosphatase (APISO): 90 U/L (ref 35–144)
BUN: 25 mg/dL (ref 7–25)
CO2: 28 mmol/L (ref 20–32)
CREATININE: 1.22 mg/dL (ref 0.70–1.33)
Calcium: 9.9 mg/dL (ref 8.6–10.3)
Chloride: 104 mmol/L (ref 98–110)
GFR, Est African American: 75 mL/min/{1.73_m2} (ref >=60)
GFR, Est Non African American: 65 mL/min/{1.73_m2} (ref >=60)
Globulin: 3 g/dL (calc) (ref 1.9–3.7)
Glucose, Bld: 299 mg/dL — ABNORMAL HIGH (ref 65–99)
Potassium: 4.8 mmol/L (ref 3.5–5.3)
Sodium: 139 mmol/L (ref 135–146)
Total Bilirubin: 0.7 mg/dL (ref 0.2–1.2)
Total Protein: 7.4 g/dL (ref 6.1–8.1)

## 2019-01-25 LAB — CBC
HEMATOCRIT: 40.3 % (ref 38.5–50.0)
HEMOGLOBIN: 13.7 g/dL (ref 13.2–17.1)
MCH: 29 pg (ref 27.0–33.0)
MCHC: 34 g/dL (ref 32.0–36.0)
MCV: 85.2 fL (ref 80.0–100.0)
MPV: 11.1 fL (ref 7.5–12.5)
Platelets: 221 10*3/uL (ref 140–400)
RBC: 4.73 10*6/uL (ref 4.20–5.80)
RDW: 12.6 % (ref 11.0–15.0)
WBC: 8 10*3/uL (ref 3.8–10.8)

## 2019-01-25 LAB — LIPID PANEL
CHOL/HDL RATIO: 3.3 (calc) (ref <5.0)
Cholesterol: 154 mg/dL (ref <200)
HDL: 46 mg/dL (ref >=40)
LDL Cholesterol (Calc): 76 mg/dL (calc)
Non-HDL Cholesterol (Calc): 108 mg/dL (calc) (ref <130)
Triglycerides: 227 mg/dL — ABNORMAL HIGH (ref <150)

## 2019-01-25 LAB — HEMOGLOBIN A1C
EAG (MMOL/L): 15.1 (calc)
HEMOGLOBIN A1C: 11.1 %{Hb} — AB (ref <5.7)
Mean Plasma Glucose: 272 (calc)

## 2019-01-25 LAB — TSH: TSH: 1.64 mIU/L (ref 0.40–4.50)

## 2019-01-30 ENCOUNTER — Ambulatory Visit (INDEPENDENT_AMBULATORY_CARE_PROVIDER_SITE_OTHER): Payer: BLUE CROSS/BLUE SHIELD

## 2019-01-30 DIAGNOSIS — I428 Other cardiomyopathies: Secondary | ICD-10-CM | POA: Diagnosis not present

## 2019-01-30 DIAGNOSIS — Z23 Encounter for immunization: Secondary | ICD-10-CM | POA: Diagnosis not present

## 2019-01-30 DIAGNOSIS — I5022 Chronic systolic (congestive) heart failure: Secondary | ICD-10-CM

## 2019-01-30 LAB — CUP PACEART REMOTE DEVICE CHECK
Date Time Interrogation Session: 20200217192526
Implantable Lead Implant Date: 20190219
Implantable Lead Model: 293
Implantable Lead Serial Number: 438538
Implantable Pulse Generator Implant Date: 20190219
MDC IDC LEAD LOCATION: 753860
Pulse Gen Serial Number: 248173

## 2019-02-08 NOTE — Progress Notes (Signed)
Remote ICD transmission.   

## 2019-02-14 ENCOUNTER — Encounter: Payer: Self-pay | Admitting: *Deleted

## 2019-03-08 ENCOUNTER — Ambulatory Visit: Payer: BLUE CROSS/BLUE SHIELD | Admitting: Family Medicine

## 2019-03-13 ENCOUNTER — Other Ambulatory Visit: Payer: Self-pay | Admitting: Family Medicine

## 2019-03-22 ENCOUNTER — Other Ambulatory Visit: Payer: Self-pay

## 2019-03-22 ENCOUNTER — Ambulatory Visit (INDEPENDENT_AMBULATORY_CARE_PROVIDER_SITE_OTHER): Payer: BLUE CROSS/BLUE SHIELD | Admitting: Family Medicine

## 2019-03-22 VITALS — BP 130/70 | Ht 69.0 in | Wt 215.0 lb

## 2019-03-22 DIAGNOSIS — F1010 Alcohol abuse, uncomplicated: Secondary | ICD-10-CM

## 2019-03-22 DIAGNOSIS — E782 Mixed hyperlipidemia: Secondary | ICD-10-CM

## 2019-03-22 DIAGNOSIS — I10 Essential (primary) hypertension: Secondary | ICD-10-CM

## 2019-03-22 DIAGNOSIS — Z794 Long term (current) use of insulin: Secondary | ICD-10-CM

## 2019-03-22 DIAGNOSIS — Z91199 Patient's noncompliance with other medical treatment and regimen due to unspecified reason: Secondary | ICD-10-CM

## 2019-03-22 DIAGNOSIS — E663 Overweight: Secondary | ICD-10-CM

## 2019-03-22 DIAGNOSIS — E1165 Type 2 diabetes mellitus with hyperglycemia: Secondary | ICD-10-CM | POA: Diagnosis not present

## 2019-03-22 DIAGNOSIS — IMO0001 Reserved for inherently not codable concepts without codable children: Secondary | ICD-10-CM

## 2019-03-22 DIAGNOSIS — I5022 Chronic systolic (congestive) heart failure: Secondary | ICD-10-CM

## 2019-03-22 DIAGNOSIS — Z9119 Patient's noncompliance with other medical treatment and regimen: Secondary | ICD-10-CM

## 2019-03-22 DIAGNOSIS — Z125 Encounter for screening for malignant neoplasm of prostate: Secondary | ICD-10-CM

## 2019-03-22 NOTE — Assessment & Plan Note (Signed)
Controlled, no change in medication DASH diet and commitment to daily physical activity for a minimum of 30 minutes discussed and encouraged, as a part of hypertension management. The importance of attaining a healthy weight is also discussed.  BP/Weight 03/22/2019 11/16/2018 07/06/2018 05/26/2018 02/23/2018 02/21/2018 0/94/7096  Systolic BP 283 662 947 654 650 354 656  Diastolic BP 70 80 82 82 80 80 79  Wt. (Lbs) 215 216.04 203 211 215 214 203  BMI 31.75 31.9 29.98 29.43 29.99 31.6 -

## 2019-03-22 NOTE — Progress Notes (Signed)
Virtual Visit via Telephone Note  I connected with Antonio Cobb on 03/22/19 at  4:00 PM EDT by telephone and verified that I am speaking with the correct person using two identifiers.   I discussed the limitations, risks, security and privacy concerns of performing an evaluation and management service by telephone and the availability of in person appointments. I also discussed with the patient that there may be a patient responsible charge related to this service. The patient expressed understanding and agreed to proceed.   History of Present Illness: F/u chronic problems with focus on uncontrolled diabetes and the need to receive the care he needs with Endo, also the need o discontinue alcohol  Desdpite multiple promises for over 9 months, patient still has made no appt with Endo Denies recent fever or chills. Denies sinus pressure, nasal congestion, ear pain or sore throat. Denies chest congestion, productive cough or wheezing. Denies chest pains, palpitations and leg swelling Denies abdominal pain, nausea, vomiting,diarrhea or constipation.   Denies dysuria, frequency, hesitancy or incontinence. Denies joint pain, swelling and limitation in mobility. Denies headaches, seizures, numbness, or tingling. Denies depression, anxiety or insomnia. Denies skin break down or rash.  Blood sugar has been as low as 140 reportedly, states doing better since unable to go out and eat outside of the home Denies polyuria, polydipsia, blurred vision , or hypoglycemic episodes.      Observations/Objective: BP 130/70   Ht 5\' 9"  (1.753 m)   Wt 215 lb (97.5 kg)   BMI 31.75 kg/m    Assessment and Plan:  Essential hypertension Controlled, no change in medication DASH diet and commitment to daily physical activity for a minimum of 30 minutes discussed and encouraged, as a part of hypertension management. The importance of attaining a healthy weight is also discussed.  BP/Weight 03/22/2019  11/16/2018 07/06/2018 05/26/2018 02/23/2018 02/21/2018 4/40/1027  Systolic BP 253 664 403 474 259 563 875  Diastolic BP 70 80 82 82 80 80 79  Wt. (Lbs) 215 216.04 203 211 215 214 203  BMI 31.75 31.9 29.98 29.43 29.99 31.6 -       Diabetes mellitus, insulin dependent (IDDM), uncontrolled ([facility]) Uncontrolled, deteriorating, re educated re the ABSOLUTE imporatnce of changing this situation , which mandated Endo treatment Antonio Cobb is reminded of the importance of commitment to daily physical activity for 30 minutes or more, as able and the need to limit carbohydrate intake to 30 to 60 grams per meal to help with blood sugar control.   The need to take medication as prescribed, test blood sugar as directed, and to call between visits if there is a concern that blood sugar is uncontrolled is also discussed.   Antonio Cobb is reminded of the importance of daily foot exam, annual eye examination, and good blood sugar, blood pressure and cholesterol control.  Diabetic Labs Latest Ref Rng & Units 01/24/2019 08/09/2018 02/22/2018 01/27/2018 09/09/2017  HbA1c <5.7 % of total Hgb 11.1(H) 10.7(H) 9.3(H) - -  Microalbumin mg/dL - 0.3 - - -  Micro/Creat Ratio 0.0 - 30.0 mg/g creat - - - - -  Chol <200 mg/dL 154 233(H) 211(H) - -  HDL > OR = 40 mg/dL 46 30(L) 36(L) - -  Calc LDL mg/dL (calc) 76 - - - -  Triglycerides <150 mg/dL 227(H) 1,064(H) 526(H) - -  Creatinine 0.70 - 1.33 mg/dL 1.22 1.25 1.17 1.49(H) 1.12   BP/Weight 03/22/2019 11/16/2018 07/06/2018 05/26/2018 02/23/2018 02/21/2018 6/43/3295  Systolic BP 188 416 606 301  078 675 449  Diastolic BP 70 80 82 82 80 80 79  Wt. (Lbs) 215 216.04 203 211 215 214 203  BMI 31.75 31.9 29.98 29.43 29.99 31.6 -   Foot/eye exam completion dates Latest Ref Rng & Units 02/21/2018 10/28/2016  Eye Exam No Retinopathy - -  Foot Form Completion - Done Done        Alcohol abuse Alcohol dependence with cardiomyopathy and uncontrolled diabetes.  Pt still resisting going  to AA, though desperately needs to discontinue his alcohol use  Chronic systolic (congestive) heart failure ([facility]) Currently stable, denies PND, orthopnea, leg swelling or exercise intolerance  Overweight   Patient re-educated about  the importance of commitment to a  minimum of 150 minutes of exercise per week as able.  The importance of healthy food choices with portion control discussed, as well as eating regularly and within a 12 hour window most days. The need to choose "clean , green" food 50 to 75% of the time is discussed, as well as to make water the primary drink and set a goal of 64 ounces water daily.     Weight /BMI 03/22/2019 11/16/2018 07/06/2018  WEIGHT 215 lb 216 lb 0.6 oz 203 lb  HEIGHT 5\' 9"  5\' 9"  5\' 9"   BMI 31.75 kg/m2 31.9 kg/m2 29.98 kg/m2      Personal history of noncompliance with medical treatment, presenting hazards to health Ongoing major challenge as far as diabetes management is concerned, follow through with appt to Endo is stressed   Follow Up Instructions:    I discussed the assessment and treatment plan with the patient. The patient was provided an opportunity to ask questions and all were answered. The patient agreed with the plan and demonstrated an understanding of the instructions.   The patient was advised to call back or seek an in-person evaluation if the symptoms worsen or if the condition fails to improve as anticipated.  I provided 22  minutes of non-face-to-face time during this encounter.   Tula Nakayama, MD

## 2019-03-22 NOTE — Patient Instructions (Addendum)
Annual physical exam last week  In August, call if you need me sooner  You need to contact Dr Liliane Channel office and get your f/u appointment date to see him, this is very important  Please DO WORK on STOPPING alcohol, since this is not only damaging your overall health, but also your heart. Your heart does not pump as well as it should because of your excessive alcohol use.  Congrats on remaining nicotine free, please continue this  Please get non fasting chem 7 and EGFr, HBA1C and PSA on May 11, a copy will be sent to Dr Dorris Fetch  Goal for fasting blood sugar ranges from 80 to 120 and 2 hours after any meal or at bedtime should be between 130 to 170.  Social distancing. Wear a face mask when outside Frequent hand washing with soap and water Keeping your hands off of your face. These 4 practices will help to keep both you and your community healthy during this time. Please practice them faithfully!  Thanks for choosing [facility], we consider it a privelige to serve you.

## 2019-03-23 ENCOUNTER — Other Ambulatory Visit: Payer: Self-pay | Admitting: Family Medicine

## 2019-03-23 ENCOUNTER — Encounter: Payer: Self-pay | Admitting: Family Medicine

## 2019-03-23 NOTE — Assessment & Plan Note (Signed)
Uncontrolled, deteriorating, re educated re the ABSOLUTE imporatnce of changing this situation , which mandated Endo treatment Mr. Bedel is reminded of the importance of commitment to daily physical activity for 30 minutes or more, as able and the need to limit carbohydrate intake to 30 to 60 grams per meal to help with blood sugar control.   The need to take medication as prescribed, test blood sugar as directed, and to call between visits if there is a concern that blood sugar is uncontrolled is also discussed.   Mr. Pierron is reminded of the importance of daily foot exam, annual eye examination, and good blood sugar, blood pressure and cholesterol control.  Diabetic Labs Latest Ref Rng & Units 01/24/2019 08/09/2018 02/22/2018 01/27/2018 09/09/2017  HbA1c <5.7 % of total Hgb 11.1(H) 10.7(H) 9.3(H) - -  Microalbumin mg/dL - 0.3 - - -  Micro/Creat Ratio 0.0 - 30.0 mg/g creat - - - - -  Chol <200 mg/dL 154 233(H) 211(H) - -  HDL > OR = 40 mg/dL 46 30(L) 36(L) - -  Calc LDL mg/dL (calc) 76 - - - -  Triglycerides <150 mg/dL 227(H) 1,064(H) 526(H) - -  Creatinine 0.70 - 1.33 mg/dL 1.22 1.25 1.17 1.49(H) 1.12   BP/Weight 03/22/2019 11/16/2018 07/06/2018 05/26/2018 02/23/2018 02/21/2018 02/06/4974  Systolic BP 300 511 021 117 356 701 410  Diastolic BP 70 80 82 82 80 80 79  Wt. (Lbs) 215 216.04 203 211 215 214 203  BMI 31.75 31.9 29.98 29.43 29.99 31.6 -   Foot/eye exam completion dates Latest Ref Rng & Units 02/21/2018 10/28/2016  Eye Exam No Retinopathy - -  Foot Form Completion - Done Done

## 2019-03-23 NOTE — Assessment & Plan Note (Signed)
   Patient re-educated about  the importance of commitment to a  minimum of 150 minutes of exercise per week as able.  The importance of healthy food choices with portion control discussed, as well as eating regularly and within a 12 hour window most days. The need to choose "clean , green" food 50 to 75% of the time is discussed, as well as to make water the primary drink and set a goal of 64 ounces water daily.     Weight /BMI 03/22/2019 11/16/2018 07/06/2018  WEIGHT 215 lb 216 lb 0.6 oz 203 lb  HEIGHT 5\' 9"  5\' 9"  5\' 9"   BMI 31.75 kg/m2 31.9 kg/m2 29.98 kg/m2

## 2019-03-23 NOTE — Assessment & Plan Note (Signed)
Currently stable, denies PND, orthopnea, leg swelling or exercise intolerance

## 2019-03-23 NOTE — Assessment & Plan Note (Signed)
Alcohol dependence with cardiomyopathy and uncontrolled diabetes.  Pt still resisting going to AA, though desperately needs to discontinue his alcohol use

## 2019-03-23 NOTE — Assessment & Plan Note (Signed)
Ongoing major challenge as far as diabetes management is concerned, follow through with appt to Endo is stressed

## 2019-03-27 ENCOUNTER — Ambulatory Visit (INDEPENDENT_AMBULATORY_CARE_PROVIDER_SITE_OTHER): Payer: Self-pay | Admitting: "Endocrinology

## 2019-03-27 ENCOUNTER — Encounter: Payer: Self-pay | Admitting: "Endocrinology

## 2019-03-27 ENCOUNTER — Other Ambulatory Visit: Payer: Self-pay

## 2019-03-27 DIAGNOSIS — E1159 Type 2 diabetes mellitus with other circulatory complications: Secondary | ICD-10-CM

## 2019-03-27 DIAGNOSIS — E782 Mixed hyperlipidemia: Secondary | ICD-10-CM

## 2019-03-27 MED ORDER — INSULIN GLARGINE 100 UNIT/ML SOLOSTAR PEN: 50.0000 [IU] | PEN_INJECTOR | Freq: Every day | SUBCUTANEOUS | 2 refills | Status: DC

## 2019-03-27 MED ORDER — INSULIN REGULAR HUMAN 100 UNIT/ML IJ SOLN: 10.0000 [IU] | Freq: Three times a day (TID) | INTRAMUSCULAR | 2 refills | Status: DC

## 2019-03-27 NOTE — Progress Notes (Signed)
03/27/2019, 4:18 PM                                                    Endocrinology Telephone Visit Follow up Note -During COVID -19 Pandemic   Subjective:    Patient ID: Antonio Cobb, male    DOB: Jan 14, 1959.  he is being engaged in telephone phone visit for follow-up of his currently uncontrolled symptomatic type 2 diabetes, hyperlipidemia. PMD :  [name], MD.   Past Medical History:  Diagnosis Date  . Alcohol abuse   . CAD (coronary artery disease)    a. s/p inferolateral STEMI in 05/2012 with DES to LCx b. patent stent by cath in 05/2016 with moderate RCA and LAD disease  . Cardiomyopathy ([place])    a. LVEF 30-35% by echo in 09/2016. b. 08/2017: echo showing EF remains reduced at 25-30% c. s/p [employer] ICD placement in 01/2018  . COPD (chronic obstructive pulmonary disease) ([place])   . Diabetes mellitus, type 2 ([place])   . Essential hypertension   . Mitral regurgitation    Moderate  . Mixed hyperlipidemia   . Myocardial infarction ([place]) 05/23/2012   Inferolateral STEMI  . Obesity    Past Surgical History:  Procedure Laterality Date  . CARDIAC CATHETERIZATION  2 yrs ago  . CARDIAC CATHETERIZATION N/A 05/15/2016   Procedure: Right/Left Heart Cath and Coronary Angiography;  Surgeon: [name], MD;  Location: [facility];  Service: Cardiovascular;  Laterality: N/A;  . CORONARY STENT PLACEMENT  05/23/12  . ICD IMPLANT N/A 02/01/2018   Procedure: ICD IMPLANT;  Surgeon: Evans Lance, MD;  Location: [facility];  Service: Cardiovascular;  Laterality: N/A;  . LEFT HEART CATHETERIZATION WITH CORONARY ANGIOGRAM N/A 05/23/2012   Procedure: LEFT HEART CATHETERIZATION WITH CORONARY ANGIOGRAM;  Surgeon: [name], MD;  Location: [facility] CATH LAB;  Service: Cardiovascular;  Laterality: N/A;  . None    . PERCUTANEOUS CORONARY STENT INTERVENTION (PCI-S) N/A  05/23/2012   Procedure: PERCUTANEOUS CORONARY STENT INTERVENTION (PCI-S);  Surgeon: [name], MD;  Location: [facility] CATH LAB;  Service: Cardiovascular;  Laterality: N/A;  . POLYPECTOMY  10/16/2011   Procedure: POLYPECTOMY;  Surgeon: [name], MD;  Location: [facility];  Service: Endoscopy;;  Polypoid Lesion, Transverse and Sigmoid Colon   Social History   Socioeconomic History  . Marital status: Married    Spouse name: Not on file  . Number of children: Not on file  . Years of education: Not on file  . Highest education level: Not on file  Occupational History  . Occupation: Therapist, sports: [employer]  Social Needs  . Financial resource strain: Not on file  . Food insecurity:    Worry: Not on file    Inability: Not on file  . Transportation needs:    Medical: Not on file    Non-medical: Not on file  Tobacco Use  .  Smoking status: Former Smoker    Packs/day: 0.25    Years: 30.00    Pack years: 7.50    Types: Cigars    Last attempt to quit: 05/23/2016    Years since quitting: 2.8  . Smokeless tobacco: Never Used  . Tobacco comment: since early 64s  Substance and Sexual Activity  . Alcohol use: Yes    Alcohol/week: 0.0 standard drinks    Comment: occ  . Drug use: No  . Sexual activity: Not Currently  Lifestyle  . Physical activity:    Days per week: Not on file    Minutes per session: Not on file  . Stress: Not on file  Relationships  . Social connections:    Talks on phone: Not on file    Gets together: Not on file    Attends religious service: Not on file    Active member of club or organization: Not on file    Attends meetings of clubs or organizations: Not on file    Relationship status: Not on file  Other Topics Concern  . Not on file  Social History Narrative  . Not on file   Outpatient Encounter Medications as of 03/27/2019  Medication Sig  . amLODipine (NORVASC) 10 MG tablet Take 1 tablet (10 mg total) by mouth daily.  [name] aspirin  (ASPIRIN ADULT LOW STRENGTH) 81 MG EC tablet Take 81 mg by mouth daily.    [name] atorvastatin (LIPITOR) 40 MG tablet Take 0.5 tablets (20 mg total) by mouth daily.  . budesonide-formoterol (SYMBICORT) 80-4.5 MCG/ACT inhaler Inhale 2 puffs into the lungs 2 (two) times daily.  [name] FREESTYLE LITE test strip TEST FOUR TIMES DAILY  . furosemide (LASIX) 40 MG tablet Take 1 tablet (40 mg total) by mouth 2 (two) times daily.  . Insulin Glargine (LANTUS SOLOSTAR) 100 UNIT/ML Solostar Pen Inject 50 Units into the skin at bedtime. Everynight  . insulin regular (NOVOLIN R,HUMULIN R) 100 units/mL injection Inject 0.1 mLs (10 Units total) into the skin 3 (three) times daily before meals.  [name] ipratropium (ATROVENT) 0.02 % nebulizer solution Take 2.5 mLs (0.5 mg total) by nebulization 4 (four) times daily.  [name] ipratropium-albuterol (DUONEB) 0.5-2.5 (3) MG/3ML SOLN Take 3 mLs by nebulization every 6 (six) hours as needed.  [name] losartan (COZAAR) 25 MG tablet TAKE 1/2 TABLET(12.5 MG) BY MOUTH DAILY  . metoprolol succinate (TOPROL-XL) 50 MG 24 hr tablet take 1 tablet once daily with food  . nitroGLYCERIN (NITROSTAT) 0.4 MG SL tablet Place 1 tablet (0.4 mg total) under the tongue every 5 (five) minutes as needed for chest pain.  . potassium chloride SA (KLOR-CON M20) 20 MEQ tablet Take 1 tablet (20 mEq total) by mouth daily.  . promethazine-dextromethorphan (PROMETHAZINE-DM) 6.25-15 MG/5ML syrup One teaspoon at bedtime as needed,for cough  . spironolactone (ALDACTONE) 25 MG tablet TAKE 1 TABLET BY MOUTH ONCE DAILY  . tiotropium (SPIRIVA HANDIHALER) 18 MCG inhalation capsule inhale the contents of one capsule in the handihaler once daily  . [DISCONTINUED] Insulin Glargine (LANTUS SOLOSTAR) 100 UNIT/ML Solostar Pen Inject 50 Units daily at 10 pm into the skin. Everynight   . [DISCONTINUED] insulin regular (NOVOLIN R,HUMULIN R) 100 units/mL injection Inject 10 Units 3 (three) times daily before meals into the skin.   No  facility-administered encounter medications on file as of 03/27/2019.     ALLERGIES: Allergies  Allergen Reactions  . Other     Seasonal allergies  - has to use inhaler    VACCINATION  STATUS: Immunization History  Administered Date(s) Administered  . Influenza Split 11/11/2011, 09/07/2012  . Influenza,inj,Quad PF,6+ Mos 03/12/2016, 10/08/2016, 09/14/2017, 01/30/2019, 01/30/2019  . Pneumococcal Polysaccharide-23 08/13/2011, 10/08/2016  . Td 07/01/2004  . Tdap 07/05/2014    Diabetes  He presents for his follow-up diabetic visit. He has type 2 diabetes mellitus. Onset time: She was diagnosed at approximate age of 73 years. His disease course has been worsening (Patient was seen prior to 3 years ago in this clinic, no showed for unclear reasons. He did have a history of noncompliance/nonadherence.). There are no hypoglycemic associated symptoms. Pertinent negatives for hypoglycemia include no confusion, pallor or seizures. Associated symptoms include polydipsia and polyuria. Pertinent negatives for diabetes include no fatigue, no polyphagia and no weakness. There are no hypoglycemic complications. Symptoms are worsening. Diabetic complications include heart disease and impotence. Risk factors for coronary artery disease include diabetes mellitus, dyslipidemia, hypertension, male sex, sedentary lifestyle and tobacco exposure. He is following a generally unhealthy diet. When asked about meal planning, he reported none. He has not had a previous visit with a dietitian. He never participates in exercise. His overall blood glucose range is >200 mg/dl. (This patient was seen in November 2018, did not keep his subsequent follow-up appointments.  He is returning with higher A1c of 11.1%.  He is not consistent taking his Lantus admittedly.  ) An ACE inhibitor/angiotensin II receptor blocker is being taken. He does not see a podiatrist.Eye exam is current.  Hyperlipidemia  This is a chronic problem. The  current episode started more than 1 year ago. Exacerbating diseases include diabetes. Pertinent negatives include no myalgias. Current antihyperlipidemic treatment includes statins. Risk factors for coronary artery disease include diabetes mellitus, dyslipidemia, hypertension and a sedentary lifestyle.     Objective:    There were no vitals taken for this visit.  Wt Readings from Last 3 Encounters:  03/22/19 215 lb (97.5 kg)  11/16/18 216 lb 0.6 oz (98 kg)  07/06/18 203 lb (92.1 kg)     CMP ( most recent) CMP     Component Value Date/Time   NA 139 01/24/2019 0841   K 4.8 01/24/2019 0841   CL 104 01/24/2019 0841   CO2 28 01/24/2019 0841   GLUCOSE 299 (H) 01/24/2019 0841   BUN 25 01/24/2019 0841   CREATININE 1.22 01/24/2019 0841   CALCIUM 9.9 01/24/2019 0841   PROT 7.4 01/24/2019 0841   ALBUMIN 3.5 09/07/2017 0424   AST 23 01/24/2019 0841   ALT 27 01/24/2019 0841   ALKPHOS 73 09/07/2017 0424   BILITOT 0.7 01/24/2019 0841   GFRNONAA 65 01/24/2019 0841   GFRAA 75 01/24/2019 0841     Diabetic Labs (most recent): Lab Results  Component Value Date   HGBA1C 11.1 (H) 01/24/2019   HGBA1C 10.7 (H) 08/09/2018   HGBA1C 9.3 (H) 02/22/2018   Lipid Panel     Component Value Date/Time   CHOL 154 01/24/2019 0841   TRIG 227 (H) 01/24/2019 0841   HDL 46 01/24/2019 0841   CHOLHDL 3.3 01/24/2019 0841   VLDL 27 02/09/2017 1201   LDLCALC 76 01/24/2019 0841   LDLDIRECT TEST NOT PERFORMED 06/19/2011 1230      Lab Results  Component Value Date   TSH 1.64 01/24/2019   TSH 1.38 03/12/2016   TSH 1.789 12/30/2012   TSH 2.849 11/11/2011   TSH 1.597 12/11/2009      Assessment & Plan:   1. DM type 2 causing vascular disease ([place])  - Patient  has currently uncontrolled symptomatic type 2 DM since  58 years of age. -After he no showed for more than 2 years, he showed up with worsening status of his diabetes with A1c of 11.1%.  Recent labs reviewed.  -his diabetes is complicated by  coronary artery disease, noncompliance/nonadherence, and Dimitris A Corne remains at a high risk for more acute and chronic complications which include CAD, CVA, CKD, retinopathy, and neuropathy. These are all discussed in detail with the patient.  - I have counseled him on diet management  by adopting a carbohydrate restricted/protein rich diet.  - Patient admits there is a room for improvement in his diet and drink choices. -  Suggestion is made for him to avoid simple carbohydrates  from his diet including Cakes, Sweet Desserts / Pastries, Ice Cream, Soda (diet and regular), Sweet Tea, Candies, Chips, Cookies, Store Bought Juices, Alcohol in Excess of  1-2 drinks a day, Artificial Sweeteners, and "Sugar-free" Products. This will help patient to have stable blood glucose profile and potentially avoid unintended weight gain.  - I encouraged him to switch to  unprocessed or minimally processed complex starch and increased protein intake (animal or plant source), fruits, and vegetables.  -He does not monitor blood glucose regularly, reports random glycemic profile greater than 200 mg per DL on average.  - he is advised to stick to a routine mealtimes to eat 3 meals  a day and avoid unnecessary snacks ( to snack only to correct hypoglycemia).   - I have approached him with the following individualized plan to manage diabetes and patient agrees:   -Patient admits that he has not been taking his Lantus consistently, has been taking regular insulin 20 to 25 units 3 times daily AC.   -He is approached again to resume his Lantus 50 units daily at bedtime , and decrease his Novolin R  10  units 3 times a day with meals  for pre-meal BG readings of 90-150mg /dl, plus patient specific correction dose for unexpected hyperglycemia above 150mg /dl, associated with strict monitoring of glucose 4 times a day-before meals and at bedtime. -He will be considered for NovoLog after he uses up his current supplies of  Novolin R. - Patient is warned not to take insulin without proper monitoring per orders. -Adjustment parameters are given for hypo and hyperglycemia in writing. -Patient is encouraged to call clinic for blood glucose levels less than 70 or above 300 mg /dl. -  He does not tolerate metformin.  -Patient is not a candidate for  SGLT2 SGLT2 inhibitors due to CKD.  2) Lipids/HPL:   His most recent LDL was controlled at 76.  He is advised to continue atorvastatin 40 mg p.o. nightly.   - Patient Care Time Today:  25 min, of which >50% was spent in reviewing his  current and  previous labs/studies, previous treatments, and medications doses and developing a plan for long-term care based on the latest recommendations for standards of care.  Antonio Cobb participated in the discussions, expressed understanding, and voiced agreement with the above plans.  All questions were answered to his satisfaction. he is encouraged to contact clinic should he have any questions or concerns prior to his return visit.   - I advised patient to maintain close follow up with [name], MD for primary care needs.  Follow up plan: - Return in about 3 months (around 06/26/2019) for Follow up with Pre-visit Labs, Meter, and Logs.  Glade Lloyd, MD [facility] [address] [place], [place] [address] Phone: [phone]  Fax: [phone]    03/27/2019, 4:18 PM  This note was partially dictated with voice recognition software. Similar sounding words can be transcribed inadequately or may not  be corrected upon review.

## 2019-04-05 ENCOUNTER — Observation Stay (HOSPITAL_COMMUNITY)
Admission: EM | Admit: 2019-04-05 | Discharge: 2019-04-06 | Disposition: A | Discharge status: Home / Self Care | Payer: BLUE CROSS/BLUE SHIELD | Attending: Internal Medicine | Admitting: Internal Medicine

## 2019-04-05 ENCOUNTER — Emergency Department (HOSPITAL_COMMUNITY): Payer: BLUE CROSS/BLUE SHIELD

## 2019-04-05 ENCOUNTER — Encounter (HOSPITAL_COMMUNITY): Payer: Self-pay | Admitting: Emergency Medicine

## 2019-04-05 ENCOUNTER — Other Ambulatory Visit: Payer: Self-pay

## 2019-04-05 DIAGNOSIS — Z833 Family history of diabetes mellitus: Secondary | ICD-10-CM | POA: Insufficient documentation

## 2019-04-05 DIAGNOSIS — I255 Ischemic cardiomyopathy: Secondary | ICD-10-CM | POA: Insufficient documentation

## 2019-04-05 DIAGNOSIS — I252 Old myocardial infarction: Secondary | ICD-10-CM | POA: Insufficient documentation

## 2019-04-05 DIAGNOSIS — J9602 Acute respiratory failure with hypercapnia: Secondary | ICD-10-CM

## 2019-04-05 DIAGNOSIS — Z955 Presence of coronary angioplasty implant and graft: Secondary | ICD-10-CM | POA: Diagnosis not present

## 2019-04-05 DIAGNOSIS — J9601 Acute respiratory failure with hypoxia: Principal | ICD-10-CM

## 2019-04-05 DIAGNOSIS — E1159 Type 2 diabetes mellitus with other circulatory complications: Secondary | ICD-10-CM | POA: Diagnosis present

## 2019-04-05 DIAGNOSIS — J302 Other seasonal allergic rhinitis: Secondary | ICD-10-CM | POA: Insufficient documentation

## 2019-04-05 DIAGNOSIS — Z9111 Patient's noncompliance with dietary regimen: Secondary | ICD-10-CM | POA: Insufficient documentation

## 2019-04-05 DIAGNOSIS — I5043 Acute on chronic combined systolic (congestive) and diastolic (congestive) heart failure: Secondary | ICD-10-CM | POA: Diagnosis not present

## 2019-04-05 DIAGNOSIS — Z794 Long term (current) use of insulin: Secondary | ICD-10-CM | POA: Insufficient documentation

## 2019-04-05 DIAGNOSIS — I10 Essential (primary) hypertension: Secondary | ICD-10-CM | POA: Diagnosis present

## 2019-04-05 DIAGNOSIS — J80 Acute respiratory distress syndrome: Secondary | ICD-10-CM | POA: Diagnosis not present

## 2019-04-05 DIAGNOSIS — E1165 Type 2 diabetes mellitus with hyperglycemia: Secondary | ICD-10-CM | POA: Insufficient documentation

## 2019-04-05 DIAGNOSIS — Z7982 Long term (current) use of aspirin: Secondary | ICD-10-CM | POA: Diagnosis not present

## 2019-04-05 DIAGNOSIS — J811 Chronic pulmonary edema: Secondary | ICD-10-CM | POA: Diagnosis present

## 2019-04-05 DIAGNOSIS — R0902 Hypoxemia: Secondary | ICD-10-CM | POA: Diagnosis not present

## 2019-04-05 DIAGNOSIS — Z20828 Contact with and (suspected) exposure to other viral communicable diseases: Secondary | ICD-10-CM | POA: Diagnosis not present

## 2019-04-05 DIAGNOSIS — Z87891 Personal history of nicotine dependence: Secondary | ICD-10-CM | POA: Insufficient documentation

## 2019-04-05 DIAGNOSIS — I161 Hypertensive emergency: Secondary | ICD-10-CM

## 2019-04-05 DIAGNOSIS — Z7951 Long term (current) use of inhaled steroids: Secondary | ICD-10-CM | POA: Insufficient documentation

## 2019-04-05 DIAGNOSIS — Z95810 Presence of automatic (implantable) cardiac defibrillator: Secondary | ICD-10-CM | POA: Insufficient documentation

## 2019-04-05 DIAGNOSIS — I110 Hypertensive heart disease with heart failure: Secondary | ICD-10-CM | POA: Insufficient documentation

## 2019-04-05 DIAGNOSIS — Z8249 Family history of ischemic heart disease and other diseases of the circulatory system: Secondary | ICD-10-CM | POA: Diagnosis not present

## 2019-04-05 DIAGNOSIS — Z79899 Other long term (current) drug therapy: Secondary | ICD-10-CM | POA: Diagnosis not present

## 2019-04-05 DIAGNOSIS — I2510 Atherosclerotic heart disease of native coronary artery without angina pectoris: Secondary | ICD-10-CM | POA: Insufficient documentation

## 2019-04-05 DIAGNOSIS — J449 Chronic obstructive pulmonary disease, unspecified: Secondary | ICD-10-CM | POA: Diagnosis not present

## 2019-04-05 DIAGNOSIS — E782 Mixed hyperlipidemia: Secondary | ICD-10-CM | POA: Diagnosis not present

## 2019-04-05 DIAGNOSIS — R0689 Other abnormalities of breathing: Secondary | ICD-10-CM | POA: Diagnosis not present

## 2019-04-05 DIAGNOSIS — R0602 Shortness of breath: Secondary | ICD-10-CM | POA: Diagnosis not present

## 2019-04-05 LAB — CBC WITH DIFFERENTIAL/PLATELET
Abs Immature Granulocytes: 0.05 10*3/uL (ref 0.00–0.07)
Basophils Absolute: 0.1 10*3/uL (ref 0.0–0.1)
Basophils Relative: 1 %
Eosinophils Absolute: 0.3 10*3/uL (ref 0.0–0.5)
Eosinophils Relative: 2 %
HCT: 46.8 % (ref 39.0–52.0)
Hemoglobin: 14.5 g/dL (ref 13.0–17.0)
Immature Granulocytes: 0 %
Lymphocytes Relative: 10 %
Lymphs Abs: 1.1 10*3/uL (ref 0.7–4.0)
MCH: 28.1 pg (ref 26.0–34.0)
MCHC: 31 g/dL (ref 30.0–36.0)
MCV: 90.7 fL (ref 80.0–100.0)
Monocytes Absolute: 0.5 10*3/uL (ref 0.1–1.0)
Monocytes Relative: 4 %
Neutro Abs: 9.6 10*3/uL — ABNORMAL HIGH (ref 1.7–7.7)
Neutrophils Relative %: 83 %
Platelets: 229 10*3/uL (ref 150–400)
RBC: 5.16 MIL/uL (ref 4.22–5.81)
RDW: 12.5 % (ref 11.5–15.5)
WBC: 11.7 10*3/uL — ABNORMAL HIGH (ref 4.0–10.5)
nRBC: 0 % (ref 0.0–0.2)

## 2019-04-05 LAB — COMPREHENSIVE METABOLIC PANEL
ALT: 50 U/L — ABNORMAL HIGH (ref 0–44)
AST: 73 U/L — ABNORMAL HIGH (ref 15–41)
Albumin: 3.9 g/dL (ref 3.5–5.0)
Alkaline Phosphatase: 87 U/L (ref 38–126)
Anion gap: 13 (ref 5–15)
BUN: 22 mg/dL — ABNORMAL HIGH (ref 6–20)
CO2: 23 mmol/L (ref 22–32)
Calcium: 9.1 mg/dL (ref 8.9–10.3)
Chloride: 102 mmol/L (ref 98–111)
Creatinine, Ser: 1.38 mg/dL — ABNORMAL HIGH (ref 0.61–1.24)
GFR calc Af Amer: >60 mL/min (ref >60)
GFR calc non Af Amer: 56 mL/min — ABNORMAL LOW (ref >60)
Glucose, Bld: 310 mg/dL — ABNORMAL HIGH (ref 70–99)
Potassium: 3.5 mmol/L (ref 3.5–5.1)
Sodium: 138 mmol/L (ref 135–145)
Total Bilirubin: 0.6 mg/dL (ref 0.3–1.2)
Total Protein: 7.9 g/dL (ref 6.5–8.1)

## 2019-04-05 LAB — PROCALCITONIN: Procalcitonin: <0.1 ng/mL

## 2019-04-05 LAB — FIBRINOGEN: Fibrinogen: 432 mg/dL (ref 210–475)

## 2019-04-05 LAB — GLUCOSE, CAPILLARY
Glucose-Capillary: 259 mg/dL — ABNORMAL HIGH (ref 70–99)
Glucose-Capillary: 330 mg/dL — ABNORMAL HIGH (ref 70–99)
Glucose-Capillary: 266 mg/dL — ABNORMAL HIGH (ref 70–99)
Glucose-Capillary: 284 mg/dL — ABNORMAL HIGH (ref 70–99)

## 2019-04-05 LAB — D-DIMER, QUANTITATIVE: D-Dimer, Quant: 1.03 ug/mL-FEU — ABNORMAL HIGH (ref 0.00–0.50)

## 2019-04-05 LAB — FERRITIN: Ferritin: 245 ng/mL (ref 24–336)

## 2019-04-05 LAB — C-REACTIVE PROTEIN: CRP: 1.4 mg/dL — ABNORMAL HIGH (ref <1.0)

## 2019-04-05 LAB — BRAIN NATRIURETIC PEPTIDE: B Natriuretic Peptide: 231 pg/mL — ABNORMAL HIGH (ref 0.0–100.0)

## 2019-04-05 LAB — SARS CORONAVIRUS 2 BY RT PCR (HOSPITAL ORDER, PERFORMED IN ~~LOC~~ HOSPITAL LAB): SARS Coronavirus 2: NEGATIVE

## 2019-04-05 LAB — LACTATE DEHYDROGENASE: LDH: 262 U/L — ABNORMAL HIGH (ref 98–192)

## 2019-04-05 LAB — TRIGLYCERIDES: Triglycerides: 465 mg/dL — ABNORMAL HIGH (ref <150)

## 2019-04-05 LAB — MRSA PCR SCREENING: MRSA by PCR: NEGATIVE

## 2019-04-05 MED ORDER — TIOTROPIUM BROMIDE MONOHYDRATE 18 MCG IN CAPS: 18.0000 ug | ORAL_CAPSULE | Freq: Every day | RESPIRATORY_TRACT | Status: DC

## 2019-04-05 MED ORDER — SODIUM CHLORIDE 0.9 % IV SOLN: 250.0000 mL | INTRAVENOUS | Status: DC | PRN

## 2019-04-05 MED ORDER — MOMETASONE FURO-FORMOTEROL FUM 100-5 MCG/ACT IN AERO
2.0000 | INHALATION_SPRAY | Freq: Two times a day (BID) | RESPIRATORY_TRACT | Status: DC
  Administered 2019-04-05 – 2019-04-06 (×3): 2 via RESPIRATORY_TRACT
  Filled 2019-04-05: qty 8.8

## 2019-04-05 MED ORDER — SPIRONOLACTONE 25 MG PO TABS
25.0000 mg | ORAL_TABLET | Freq: Every day | ORAL | Status: DC
  Administered 2019-04-05 – 2019-04-06 (×2): 25 mg via ORAL
  Filled 2019-04-05 (×2): qty 1

## 2019-04-05 MED ORDER — ATORVASTATIN CALCIUM 20 MG PO TABS
20.0000 mg | ORAL_TABLET | Freq: Every day | ORAL | Status: DC
  Administered 2019-04-05 – 2019-04-06 (×2): 20 mg via ORAL
  Filled 2019-04-05 (×2): qty 1

## 2019-04-05 MED ORDER — IPRATROPIUM-ALBUTEROL 0.5-2.5 (3) MG/3ML IN SOLN: 3.0000 mL | Freq: Four times a day (QID) | RESPIRATORY_TRACT | Status: DC | PRN

## 2019-04-05 MED ORDER — INSULIN ASPART 100 UNIT/ML ~~LOC~~ SOLN
0.0000 [IU] | Freq: Three times a day (TID) | SUBCUTANEOUS | Status: DC
  Administered 2019-04-05: 8 [IU] via SUBCUTANEOUS
  Administered 2019-04-05: 11 [IU] via SUBCUTANEOUS
  Administered 2019-04-06: 3 [IU] via SUBCUTANEOUS

## 2019-04-05 MED ORDER — SODIUM CHLORIDE 0.9% FLUSH
3.0000 mL | Freq: Two times a day (BID) | INTRAVENOUS | Status: DC
  Administered 2019-04-05 – 2019-04-06 (×3): 3 mL via INTRAVENOUS

## 2019-04-05 MED ORDER — NITROGLYCERIN 0.4 MG SL SUBL: 0.4000 mg | SUBLINGUAL_TABLET | SUBLINGUAL | Status: DC | PRN

## 2019-04-05 MED ORDER — ACETAMINOPHEN 325 MG PO TABS: 650.0000 mg | ORAL_TABLET | Freq: Four times a day (QID) | ORAL | Status: DC | PRN

## 2019-04-05 MED ORDER — INSULIN ASPART 100 UNIT/ML ~~LOC~~ SOLN
4.0000 [IU] | Freq: Three times a day (TID) | SUBCUTANEOUS | Status: DC
  Administered 2019-04-05 – 2019-04-06 (×3): 4 [IU] via SUBCUTANEOUS

## 2019-04-05 MED ORDER — LABETALOL HCL 5 MG/ML IV SOLN: 10.0000 mg | INTRAVENOUS | Status: DC | PRN

## 2019-04-05 MED ORDER — INSULIN GLARGINE 100 UNIT/ML ~~LOC~~ SOLN
30.0000 [IU] | Freq: Every day | SUBCUTANEOUS | Status: DC
  Administered 2019-04-05: 30 [IU] via SUBCUTANEOUS
  Filled 2019-04-05 (×2): qty 0.3

## 2019-04-05 MED ORDER — POTASSIUM CHLORIDE CRYS ER 20 MEQ PO TBCR
20.0000 meq | EXTENDED_RELEASE_TABLET | Freq: Every day | ORAL | Status: DC
  Administered 2019-04-05 – 2019-04-06 (×2): 20 meq via ORAL
  Filled 2019-04-05 (×2): qty 1

## 2019-04-05 MED ORDER — FUROSEMIDE 10 MG/ML IJ SOLN
80.0000 mg | Freq: Once | INTRAMUSCULAR | Status: AC
  Administered 2019-04-05: 80 mg via INTRAVENOUS
  Filled 2019-04-05: qty 8

## 2019-04-05 MED ORDER — SODIUM CHLORIDE 0.9% FLUSH: 3.0000 mL | INTRAVENOUS | Status: DC | PRN

## 2019-04-05 MED ORDER — METOPROLOL SUCCINATE ER 50 MG PO TB24
50.0000 mg | ORAL_TABLET | Freq: Every day | ORAL | Status: DC
  Administered 2019-04-05 – 2019-04-06 (×2): 50 mg via ORAL
  Filled 2019-04-05 (×2): qty 1

## 2019-04-05 MED ORDER — LOSARTAN POTASSIUM 25 MG PO TABS: 25.0000 mg | ORAL_TABLET | Freq: Every day | ORAL | Status: DC

## 2019-04-05 MED ORDER — FUROSEMIDE 10 MG/ML IJ SOLN
40.0000 mg | Freq: Two times a day (BID) | INTRAMUSCULAR | Status: DC
  Administered 2019-04-05 – 2019-04-06 (×3): 40 mg via INTRAVENOUS
  Filled 2019-04-05 (×3): qty 4

## 2019-04-05 MED ORDER — ENOXAPARIN SODIUM 40 MG/0.4ML ~~LOC~~ SOLN: 40.0000 mg | SUBCUTANEOUS | Status: DC

## 2019-04-05 MED ORDER — ASPIRIN EC 81 MG PO TBEC
81.0000 mg | DELAYED_RELEASE_TABLET | Freq: Every day | ORAL | Status: DC
  Administered 2019-04-05 – 2019-04-06 (×2): 81 mg via ORAL
  Filled 2019-04-05 (×2): qty 1

## 2019-04-05 MED ORDER — ENOXAPARIN SODIUM 60 MG/0.6ML ~~LOC~~ SOLN
0.5000 mg/kg | SUBCUTANEOUS | Status: DC
  Administered 2019-04-05 – 2019-04-06 (×2): 50 mg via SUBCUTANEOUS
  Filled 2019-04-05 (×2): qty 0.6

## 2019-04-05 MED ORDER — INSULIN ASPART 100 UNIT/ML ~~LOC~~ SOLN
0.0000 [IU] | Freq: Every day | SUBCUTANEOUS | Status: DC
  Administered 2019-04-05: 3 [IU] via SUBCUTANEOUS

## 2019-04-05 MED ORDER — UMECLIDINIUM BROMIDE 62.5 MCG/INH IN AEPB
1.0000 | INHALATION_SPRAY | Freq: Every day | RESPIRATORY_TRACT | Status: DC
  Administered 2019-04-05 – 2019-04-06 (×2): 1 via RESPIRATORY_TRACT
  Filled 2019-04-05: qty 7

## 2019-04-05 MED ORDER — ACETAMINOPHEN 650 MG RE SUPP: 650.0000 mg | Freq: Four times a day (QID) | RECTAL | Status: DC | PRN

## 2019-04-05 MED ORDER — NITROGLYCERIN IN D5W 200-5 MCG/ML-% IV SOLN
5.0000 ug/min | INTRAVENOUS | Status: DC
  Administered 2019-04-05: 5 ug/min via INTRAVENOUS
  Filled 2019-04-05: qty 250

## 2019-04-05 NOTE — Progress Notes (Addendum)
Inpatient Diabetes Program Recommendations  AACE/ADA: New Consensus Statement on Inpatient Glycemic Control   Target Ranges:  Prepandial:   less than 140 mg/dL      Peak postprandial:   less than 180 mg/dL (1-2 hours)      Critically ill patients:  140 - 180 mg/dL   Results for Antonio Cobb, Antonio Cobb (MRN [id number]) as of 04/05/2019 08:51  Ref. Range 04/05/2019 05:54  Glucose Latest Ref Range: 70 - 99 mg/dL 310 (H)  Results for Antonio Cobb, Antonio Cobb (MRN [id number]) as of 04/05/2019 08:51  Ref. Range 01/24/2019 08:41  Hemoglobin A1C Latest Ref Range: <5.7 % of total Hgb 11.1 (H)   Review of Glycemic Control  Diabetes history: DM2 Outpatient Diabetes medications: Lantus 50 units QHS, Regular 10 units TID with meals Current orders for Inpatient glycemic control: None  Inpatient Diabetes Program Recommendations:   Insulin - Basal: Please consider ordering Lantus 30 units QHS (based on 97.5 kg x 0.3 units).  Correction (SSI): Please consider ordering CBGs with Novolog 0-15 units TID with meals and Novolog 0-5 units QHS.  Insulin - Meal Coverage: Please consider ordering Novolog 4 units TID with meals for meal coverage if patient eats at least 50% of meals.  HbgA1C: A1C 11.1% on 01/24/19 indicating an average glucose of 272 mg/dl over the past 2-3 months.  Addendum 04/05/19@11 :30-Spoke with patient over the phone. Patient reports that he takes Lantus 50 units QHS and Regular 10 units TID with meals. Patient states that he just started about 1 week ago taking insulin consistently as prescribed. Patient reports that he has ran out of insulin for a couple of weeks and seen his PCP on 03/23/19 and had a phone visit with Dr. Dorris Fetch (Endocrinologist) on 03/27/19. Patient had not followed up with Dr. Dorris Fetch in over 2 years because "I had someone give me enough insulin to last me for about 2-3 years but I finally ran out." Patient admits that he was not taking the insulin consistently for the past year "was taking it  here and there". Patient reports that over the past 1 week since he began taking insulin consistently, his glucose has been in the mid to upper 100's mg/dl. Reviewed last A1C 11.1% on 01/24/19 indicating an average glucose of 272 mg/dl. Patient was aware of A1C and notes that it was high because he was not doing what he should have been doing. Patient reports that he is back on track with DM management and he plans to call Dr. Dorris Fetch in 2 weeks to discuss glucose trends to see if additional changes need to be made. Patient reports that he thinks he took Lantus 50 units at home last night before going to bed. Current glucose up to 330 mg/dl. Discussed current insulin orders and explained that glycemic trends will be followed and insulin adjusted by MD if needed. Patient verbalized understanding of information discussed and he reports that he has no questions at this time.  Thanks, Barnie Alderman, RN, MSN, CDE Diabetes Coordinator Inpatient Diabetes Program [phone] (Team Pager from 8am to 5pm)

## 2019-04-05 NOTE — ED Notes (Signed)
Switched pt from 15L via NRB to 2L/min via N.[place]. per EDP order. Pt sating well at 99%.

## 2019-04-05 NOTE — ED Triage Notes (Signed)
Pt C/O Sob that woke him up from sleep tonight. Pt states he went to bed feeling normal and woke up SOB. Pt denies pain or fevers.

## 2019-04-05 NOTE — TOC Initial Note (Signed)
Transition of Care [facility]) - Initial/Assessment Note    Patient Details  Name: Antonio Cobb MRN: [id number] Date of Birth: 16-Feb-1961  [facility] CM/SW Contact:    Sherald Barge, RN Phone Number: 04/05/2019, 10:38 AM  Clinical Narrative:          CM consult for heart failure. Pt from home, lives with wife. Pt works as Dealer. Reports compliance with MD appointments and medications. Admits to noncompliance with dietary restrictions and daily weights. Pt communicates no needs. Pt active with cardiology for HF managements. Pt not homebound. Pt receiving eduction during hospitalization. Pt has a knowledge of what to do he "just sometimes doesn't do what he should". Pt communicates no needs or concerns about DC. No TOC needs noted at this time. CM will sign off and may be re-consulted inf needs arise.         Expected Discharge Plan: Home/Self Care     Patient Goals and CMS Choice        Expected Discharge Plan and Services Expected Discharge Plan: Home/Self Care In-house Referral: Nutrition     Living arrangements for the past 2 months: Single Family Home                          Prior Living Arrangements/Services Living arrangements for the past 2 months: Single Family Home Lives with:: Spouse Patient language and need for interpreter reviewed:: Yes Do you feel safe going back to the place where you live?: Yes      Need for Family Participation in Patient Care: No (Comment) Care giver support system in place?: Yes (comment)   Criminal Activity/Legal Involvement Pertinent to Current Situation/Hospitalization: No - Comment as needed  Activities of Daily Living Home Assistive Devices/Equipment: CBG Meter, Eyeglasses ADL Screening (condition at time of admission) Patient's cognitive ability adequate to safely complete daily activities?: Yes Is the patient deaf or have difficulty hearing?: No Does the patient have difficulty seeing, even when  wearing glasses/contacts?: No Does the patient have difficulty concentrating, remembering, or making decisions?: No Patient able to express need for assistance with ADLs?: Yes Does the patient have difficulty dressing or bathing?: No Independently performs ADLs?: Yes (appropriate for developmental age) Does the patient have difficulty walking or climbing stairs?: No Weakness of Legs: None Weakness of Arms/Hands: None  Permission Sought/Granted                  Emotional Assessment Appearance:: Appears younger than stated age, Appears stated age Attitude/Demeanor/Rapport: Engaged, Self-Confident Affect (typically observed): Pleasant, Appropriate Orientation: : Oriented to Self, Oriented to Place, Oriented to  Time, Oriented to Situation Alcohol / Substance Use: Not Applicable    Admission diagnosis:  Acute respiratory failure with hypoxia ([place]) [J96.01] Hypertensive emergency [I16.1] Patient Active Problem List   Diagnosis Date Noted  . Pulmonary edema 04/05/2019  . Muscle pain 02/28/2018  . Chronic systolic (congestive) heart failure ([place]) 02/01/2018  . DM type 2 causing vascular disease ([place]) 10/18/2017  . Personal history of noncompliance with medical treatment, presenting hazards to health 10/18/2017  . Lumbar degenerative disc disease 06/20/2017  . Chronic systolic heart failure ([place]) 11/01/2016  . Acute hypoxemic respiratory failure ([place])   . Prolonged QT interval 10/07/2016  . Coronary artery disease involving coronary bypass graft of native heart with angina pectoris ([place])   . Abnormal echocardiogram   . Acute on chronic combined systolic and diastolic CHF (congestive heart failure) ([place])   .  Allergic rhinitis 05/06/2015  . NICM (nonischemic cardiomyopathy) ([place]) 05/23/2012  . Alcohol abuse 05/23/2012  . Arteriosclerotic cardiovascular disease (ASCVD) 05/23/2012  . ED (erectile dysfunction) 05/10/2012  . Overweight 08/12/2010  . Diabetes mellitus, insulin  dependent (IDDM), uncontrolled ([place]) 03/08/2008  . Mixed hyperlipidemia 03/08/2008  . Essential hypertension 03/08/2008   PCP:  [name], MD Pharmacy:   [facility] [place], [place] [facility] [place] [address] [place] [address] Phone: [phone] Fax: [phone]     Social Determinants of Health (SDOH) Interventions    Readmission Risk Interventions Readmission Risk Prevention Plan 04/05/2019  Medication Screening Complete  Transportation Screening Complete  Some recent data might be hidden

## 2019-04-05 NOTE — ED Provider Notes (Signed)
[facility] EMERGENCY DEPARTMENT Provider Note   CSN: [id number] Arrival date & time: 04/05/19  4098    History   Chief Complaint Chief Complaint  Patient presents with  . Shortness of Breath   Level 5 caveat due to acuity of condition HPI Antonio Cobb is a 58 y.o. male.     The history is provided by the patient and the EMS personnel. The history is limited by the condition of the patient.  Shortness of Breath  Severity:  Severe Onset quality:  Sudden Timing:  Constant Progression:  Worsening Chronicity:  New Relieved by: CPAP. Worsened by:  Nothing Associated symptoms: no fever   Patient with history of CAD, ischemic cardiomyopathy with ICD in place, COPD presents with abrupt onset of shortness of breath.  Patient reports he felt well, but then woke up feeling very short of breath.  EMS was called and he was hypoxic on room air.  Still on CPAP with improvement.  He was given 1 dose of nitroglycerin. He denies any fever  Past Medical History:  Diagnosis Date  . Alcohol abuse   . CAD (coronary artery disease)    a. s/p inferolateral STEMI in 05/2012 with DES to LCx b. patent stent by cath in 05/2016 with moderate RCA and LAD disease  . Cardiomyopathy ([place])    a. LVEF 30-35% by echo in 09/2016. b. 08/2017: echo showing EF remains reduced at 25-30% c. s/p [employer] ICD placement in 01/2018  . COPD (chronic obstructive pulmonary disease) ([place])   . Diabetes mellitus, type 2 ([place])   . Essential hypertension   . Mitral regurgitation    Moderate  . Mixed hyperlipidemia   . Myocardial infarction ([place]) 05/23/2012   Inferolateral STEMI  . Obesity     Patient Active Problem List   Diagnosis Date Noted  . Muscle pain 02/28/2018  . Chronic systolic (congestive) heart failure ([place]) 02/01/2018  . DM type 2 causing vascular disease ([place]) 10/18/2017  . Personal history of noncompliance with medical treatment, presenting hazards to health 10/18/2017  . Lumbar  degenerative disc disease 06/20/2017  . Chronic systolic heart failure ([place]) 11/01/2016  . Prolonged QT interval 10/07/2016  . Coronary artery disease involving coronary bypass graft of native heart with angina pectoris ([place])   . Abnormal echocardiogram   . Allergic rhinitis 05/06/2015  . NICM (nonischemic cardiomyopathy) ([place]) 05/23/2012  . Alcohol abuse 05/23/2012  . Arteriosclerotic cardiovascular disease (ASCVD) 05/23/2012  . ED (erectile dysfunction) 05/10/2012  . Overweight 08/12/2010  . Diabetes mellitus, insulin dependent (IDDM), uncontrolled ([place]) 03/08/2008  . Mixed hyperlipidemia 03/08/2008  . Essential hypertension 03/08/2008    Past Surgical History:  Procedure Laterality Date  . CARDIAC CATHETERIZATION  2 yrs ago  . CARDIAC CATHETERIZATION N/A 05/15/2016   Procedure: Right/Left Heart Cath and Coronary Angiography;  Surgeon: [name], MD;  Location: [place] CV [facility];  Service: Cardiovascular;  Laterality: N/A;  . CORONARY STENT PLACEMENT  05/23/12  . ICD IMPLANT N/A 02/01/2018   Procedure: ICD IMPLANT;  Surgeon: Evans Lance, MD;  Location: [place] [facility];  Service: Cardiovascular;  Laterality: N/A;  . LEFT HEART CATHETERIZATION WITH CORONARY ANGIOGRAM N/A 05/23/2012   Procedure: LEFT HEART CATHETERIZATION WITH CORONARY ANGIOGRAM;  Surgeon: [name], MD;  [facility] CATH LAB;  Service: Cardiovascular;  Laterality: N/A;  . None    . PERCUTANEOUS CORONARY STENT INTERVENTION (PCI-S) N/A 05/23/2012   Procedure: PERCUTANEOUS CORONARY STENT INTERVENTION (PCI-S);  Surgeon: [name], MD;  Location: [place] CATH LAB;  Service: Cardiovascular;  Laterality: N/A;  . POLYPECTOMY  10/16/2011   Procedure: POLYPECTOMY;  Surgeon: [name], MD;  Location: [facility];  Service: Endoscopy;;  Polypoid Lesion, Transverse and Sigmoid Colon        Home Medications    Prior to Admission medications   Medication Sig Start Date End Date Taking? Authorizing Provider   amLODipine (NORVASC) 10 MG tablet Take 1 tablet (10 mg total) by mouth daily. 09/06/18   Arnoldo Lenis, MD  aspirin (ASPIRIN ADULT LOW STRENGTH) 81 MG EC tablet Take 81 mg by mouth daily.      [provider]  atorvastatin (LIPITOR) 40 MG tablet Take 0.5 tablets (20 mg total) by mouth daily. 10/13/18   [name], MD  budesonide-formoterol (SYMBICORT) 80-4.5 MCG/ACT inhaler Inhale 2 puffs into the lungs 2 (two) times daily.    [provider]  FREESTYLE LITE test strip TEST FOUR TIMES DAILY 03/23/19   [name], MD  furosemide (LASIX) 40 MG tablet Take 1 tablet (40 mg total) by mouth 2 (two) times daily. 02/23/18   Strader, Fransisco Hertz, PA-C  Insulin Glargine (LANTUS SOLOSTAR) 100 UNIT/ML Solostar Pen Inject 50 Units into the skin at bedtime. Everynight 03/27/19   Cassandria Anger, MD  insulin regular (NOVOLIN R,HUMULIN R) 100 units/mL injection Inject 0.1 mLs (10 Units total) into the skin 3 (three) times daily before meals. 03/27/19   Cassandria Anger, MD  ipratropium (ATROVENT) 0.02 % nebulizer solution Take 2.5 mLs (0.5 mg total) by nebulization 4 (four) times daily. 11/16/18   [name], MD  ipratropium-albuterol (DUONEB) 0.5-2.5 (3) MG/3ML SOLN Take 3 mLs by nebulization every 6 (six) hours as needed. 04/27/16   [name], MD  losartan (COZAAR) 25 MG tablet TAKE 1/2 TABLET(12.5 MG) BY MOUTH DAILY 03/14/19   [name], MD  metoprolol succinate (TOPROL-XL) 50 MG 24 hr tablet take 1 tablet once daily with food 02/23/18   Ahmed Prima, [place] M, PA-C  nitroGLYCERIN (NITROSTAT) 0.4 MG SL tablet Place 1 tablet (0.4 mg total) under the tongue every 5 (five) minutes as needed for chest pain. 05/29/16 11/06/19  Lendon Colonel, NP  potassium chloride SA (KLOR-CON M20) 20 MEQ tablet Take 1 tablet (20 mEq total) by mouth daily. 02/23/18   Strader, Fransisco Hertz, PA-C  promethazine-dextromethorphan (PROMETHAZINE-DM) 6.25-15 MG/5ML syrup One  teaspoon at bedtime as needed,for cough 11/16/18   [name], MD  spironolactone (ALDACTONE) 25 MG tablet TAKE 1 TABLET BY MOUTH ONCE DAILY 08/10/18   [name], MD  tiotropium (SPIRIVA HANDIHALER) 18 MCG inhalation capsule inhale the contents of one capsule in the handihaler once daily 05/12/17   [name], MD    Family History Family History  Problem Relation Age of Onset  . Emphysema Mother   . Heart attack Mother   . Hypertension Mother   . Diabetes Mother   . Heart disease Mother   . COPD Mother   . Emphysema Father   . COPD Father   . Stroke Sister   . Asthma Sister   . Colon cancer Neg Hx   . Anesthesia problems Neg Hx   . Hypotension Neg Hx   . Malignant hyperthermia Neg Hx   . Pseudochol deficiency Neg Hx     Social History Social History   Tobacco Use  . Smoking status: Former Smoker    Packs/day: 0.25    Years: 30.00  Pack years: 7.50    Types: Cigars    Last attempt to quit: 05/23/2016    Years since quitting: 2.8  . Smokeless tobacco: Never Used  . Tobacco comment: since early 53s  Substance Use Topics  . Alcohol use: Yes    Alcohol/week: 0.0 standard drinks    Comment: occ  . Drug use: No     Allergies   Other   Review of Systems Review of Systems  Unable to perform ROS: Acuity of condition  Constitutional: Negative for fever.  Respiratory: Positive for shortness of breath.      Physical Exam Updated Vital Signs BP (!) 145/93   Pulse 88   Temp 97.6 F (36.4 C) (Oral)   Resp (!) 26   SpO2 100%   Physical Exam  CONSTITUTIONAL: Acutely ill, respiratory distress noted HEAD: Normocephalic/atraumatic EYES: EOMI/PERRL ENMT: Mucous membranes moist NECK: supple no meningeal signs, no JVD SPINE/BACK:entire spine nontender CV: Tachycardic, no loud murmurs LUNGS: Respiratory distress noted, tachypnea, crackles or wheezing bilaterally ABDOMEN: soft, nontender, no rebound or guarding, bowel sounds noted  throughout abdomen GU:no cva tenderness NEURO: Pt is awake/alert/appropriate, moves all extremitiesx4.  No facial droop.   EXTREMITIES: pulses normal/equal, full ROM, no lower extremity edema SKIN: warm, color normal PSYCH: Anxious ED Treatments / Results  Labs (all labs ordered are listed, but only abnormal results are displayed) Labs Reviewed  CBC WITH DIFFERENTIAL/PLATELET - Abnormal; Notable for the following components:      Result Value   WBC 11.7 (*)    Neutro Abs 9.6 (*)    All other components within normal limits  D-DIMER, QUANTITATIVE (NOT AT [facility]) - Abnormal; Notable for the following components:   D-Dimer, Quant 1.03 (*)    All other components within normal limits  TRIGLYCERIDES - Abnormal; Notable for the following components:   Triglycerides 465 (*)    All other components within normal limits  SARS CORONAVIRUS 2 (HOSPITAL ORDER, [place] [facility])  FIBRINOGEN  COMPREHENSIVE METABOLIC PANEL  PROCALCITONIN  LACTATE DEHYDROGENASE  FERRITIN  C-REACTIVE PROTEIN  BRAIN NATRIURETIC PEPTIDE    EKG  ED ECG REPORT   Date: 04/05/2019 04:34am  Rate: 98  Rhythm: normal sinus rhythm  QRS Axis: normal  Intervals: normal  ST/T Wave abnormalities: nonspecific ST changes  Conduction Disutrbances:none  Narrative Interpretation:   Old EKG Reviewed: unchanged  I have personally reviewed the EKG tracing and agree with the computerized printout as noted.  Radiology Dg Chest Port 1 View  Result Date: 04/05/2019 CLINICAL DATA:  58 year old male with shortness of breath. EXAM: PORTABLE CHEST 1 VIEW COMPARISON:  11/16/2018 and earlier. FINDINGS: Portable AP upright view at 0451 hours. Stable left chest AICD. Stable cardiac size and mediastinal contours. Stable to mildly lower lung volumes. New streaky right upper lung and patchy/indistinct right lower lung opacity. Similar patchy/indistinct opacity at the left lung base. Pulmonary vascularity does appear  mildly increased and there is trace fluid or thickening in the right minor fissure. No dependent pleural effusion is evident. No pneumothorax. Negative visible bowel gas pattern. Visualized tracheal air column is within normal limits. No acute osseous abnormality identified. IMPRESSION: 1. New patchy and indistinct bilateral pulmonary opacity. Suggestion of superimposed increased vascularity and possible trace fluid in the right minor fissure. 2. Differential considerations include acute viral/atypical respiratory infection and interstitial edema with atelectasis. Electronically Signed   By: Genevie Ann M.D.   On: 04/05/2019 05:23    Procedures Procedures  CRITICAL CARE Performed  by: Sharyon Cable Total critical care time: 40 minutes Critical care time was exclusive of separately billable procedures and treating other patients. Critical care was necessary to treat or prevent imminent or life-threatening deterioration. Critical care was time spent personally by me on the following activities: development of treatment plan with patient and/or surrogate as well as nursing, discussions with consultants, evaluation of patient's response to treatment, examination of patient, obtaining history from patient or surrogate, ordering and performing treatments and interventions, ordering and review of laboratory studies, ordering and review of radiographic studies, pulse oximetry and re-evaluation of patient's condition. Patient with respiratory failure requiring high flow oxygen, as well as blood pressure control with nitroglycerin.  Medications Ordered in ED Medications  nitroGLYCERIN 50 mg in dextrose 5 % 250 mL (0.2 mg/mL) infusion (45 mcg/min Intravenous Rate/Dose Change 04/05/19 0629)  furosemide (LASIX) injection 80 mg (80 mg Intravenous Given 04/05/19 0442)     Initial Impression / Assessment and Plan / ED Course  I have reviewed the triage vital signs and the nursing notes.  Pertinent labs & imaging  results that were available during my care of the patient were reviewed by me and considered in my medical decision making (see chart for details).        4:42 AM Presents for abrupt onset of shortness of breath in the setting of hypertension.  He had been on CPAP, but this has been removed for now due to COVID-19 concerns We will start with Lasix and nitroglycerin and follow closely 5:05 AM Pt appears to be improving Labs/imaging pending 6:34 AM Patient presented initially with respiratory distress.  We initially had entertained the idea of COVID-19 given his symptomatology.  Initial COVID test is negative.  He is afebrile, denies recent fevers.  He had significant hypertension on arrival, and appeared to improve dramatically with a nitroglycerin drip.  He is also responding to diuresis.  Strong suspicion this is respiratory failure with hypoxia due to acute pulmonary edema from hypertensive emergency.  His d-dimer is elevated, but this was ordered in the setting of potential COVID-19, will defer further chest imaging at this time.  Discussed the case with Dr. Olevia Bowens with [facility]ad who will admit the patient  Antonio Cobb was evaluated in Emergency Department on 04/05/2019 for the symptoms described in the history of present illness. He was evaluated in the context of the global COVID-19 pandemic, which necessitated consideration that the patient might be at risk for infection with the SARS-CoV-2 virus that causes COVID-19. Institutional protocols and algorithms that pertain to the evaluation of patients at risk for COVID-19 are in a state of rapid change based on information released by regulatory bodies including the CDC and federal and state organizations. These policies and algorithms were followed during the patient's care in the ED.  Final Clinical Impressions(s) / ED Diagnoses   Final diagnoses:  Acute respiratory failure with hypoxia [facility])  Hypertensive emergency    ED Discharge  Orders    None       Ripley Fraise, MD 04/05/19 [phone]

## 2019-04-05 NOTE — H&P (Signed)
History and Physical    SENAY SISTRUNK GOT:[id number] DOB: August 31, 1961 DOA: 04/05/2019  PCP: [name], MD   Patient coming from: Home  Chief Complaint: Dyspnea  HPI: Antonio Cobb is a 58 y.o. male with medical history significant for chronic combined systolic and diastolic heart failure with prior echo in 2018 with LVEF 25-30% and grade 2 diastolic dysfunction with mild LVH, type 2 diabetes, hypertension, dyslipidemia, and COPD who presented to the ED early this morning after he noticed an abrupt onset of shortness of breath upon waking up and going to the bathroom to urinate.  He denies any chest pain, fever, chills, or cough and states that he has been compliant on his home medications to include Lasix and spironolactone.  He states that he does not monitor his fluid or sodium intake very well and does appear to have some dietary indiscretion.  He states that he has had barbecue chicken as well as pork rinds and other salty foods recently.   ED Course: In the emergency department he was started on nitroglycerin drip as well as Lasix with elevated blood pressure readings and signs of pulmonary edema on chest x-ray.  BNP was noted to be 231.  He was tested for COVID-19 and was negative.  He is currently on nitroglycerin drip with improved blood pressure readings and on 2 L nasal cannula with no respiratory distress.  He has urinated over 1 L after being given 80 mg of IV Lasix.  Review of Systems: All others reviewed and otherwise negative.  Past Medical History:  Diagnosis Date  . Alcohol abuse   . CAD (coronary artery disease)    a. s/p inferolateral STEMI in 05/2012 with DES to LCx b. patent stent by cath in 05/2016 with moderate RCA and LAD disease  . Cardiomyopathy ([place])    a. LVEF 30-35% by echo in 09/2016. b. 08/2017: echo showing EF remains reduced at 25-30% c. s/p [employer] ICD placement in 01/2018  . COPD (chronic obstructive pulmonary disease) ([place])   .  Diabetes mellitus, type 2 ([place])   . Essential hypertension   . Mitral regurgitation    Moderate  . Mixed hyperlipidemia   . Myocardial infarction ([place]) 05/23/2012   Inferolateral STEMI  . Obesity     Past Surgical History:  Procedure Laterality Date  . CARDIAC CATHETERIZATION  2 yrs ago  . CARDIAC CATHETERIZATION N/A 05/15/2016   Procedure: Right/Left Heart Cath and Coronary Angiography;  Surgeon: [name], MD;  Location: [place] [facility];  Service: Cardiovascular;  Laterality: N/A;  . CORONARY STENT PLACEMENT  05/23/12  . ICD IMPLANT N/A 02/01/2018   Procedure: ICD IMPLANT;  Surgeon: Evans Lance, MD;  Location: [place] [facility];  Service: Cardiovascular;  Laterality: N/A;  . LEFT HEART CATHETERIZATION WITH CORONARY ANGIOGRAM N/A 05/23/2012   Procedure: LEFT HEART CATHETERIZATION WITH CORONARY ANGIOGRAM;  Surgeon: [name], MD;  Location: [facility] CATH LAB;  Service: Cardiovascular;  Laterality: N/A;  . None    . PERCUTANEOUS CORONARY STENT INTERVENTION (PCI-S) N/A 05/23/2012   Procedure: PERCUTANEOUS CORONARY STENT INTERVENTION (PCI-S);  Surgeon: [name], MD;  Location: [facility] CATH LAB;  Service: Cardiovascular;  Laterality: N/A;  . POLYPECTOMY  10/16/2011   Procedure: POLYPECTOMY;  Surgeon: [name], MD;  Location: [facility];  Service: Endoscopy;;  Polypoid Lesion, Transverse and Sigmoid Colon     reports that he quit smoking about 2 years ago. His smoking use included cigars. He has a  7.50 pack-year smoking history. He has never used smokeless tobacco. He reports current alcohol use. He reports that he does not use drugs.  Allergies  Allergen Reactions  . Other     Seasonal allergies  - has to use inhaler    Family History  Problem Relation Age of Onset  . Emphysema Mother   . Heart attack Mother   . Hypertension Mother   . Diabetes Mother   . Heart disease Mother   . COPD Mother   . Emphysema Father   . COPD Father   . Stroke Sister   . Asthma  Sister   . Colon cancer Neg Hx   . Anesthesia problems Neg Hx   . Hypotension Neg Hx   . Malignant hyperthermia Neg Hx   . Pseudochol deficiency Neg Hx     Prior to Admission medications   Medication Sig Start Date End Date Taking? Authorizing Provider  amLODipine (NORVASC) 10 MG tablet Take 1 tablet (10 mg total) by mouth daily. 09/06/18   Arnoldo Lenis, MD  aspirin (ASPIRIN ADULT LOW STRENGTH) 81 MG EC tablet Take 81 mg by mouth daily.      [provider]  atorvastatin (LIPITOR) 40 MG tablet Take 0.5 tablets (20 mg total) by mouth daily. 10/13/18   [name], MD  budesonide-formoterol (SYMBICORT) 80-4.5 MCG/ACT inhaler Inhale 2 puffs into the lungs 2 (two) times daily.    [provider]  FREESTYLE LITE test strip TEST FOUR TIMES DAILY 03/23/19   [name], MD  furosemide (LASIX) 40 MG tablet Take 1 tablet (40 mg total) by mouth 2 (two) times daily. 02/23/18   Strader, Fransisco Hertz, PA-C  Insulin Glargine (LANTUS SOLOSTAR) 100 UNIT/ML Solostar Pen Inject 50 Units into the skin at bedtime. Everynight 03/27/19   Cassandria Anger, MD  insulin regular (NOVOLIN R,HUMULIN R) 100 units/mL injection Inject 0.1 mLs (10 Units total) into the skin 3 (three) times daily before meals. 03/27/19   Cassandria Anger, MD  ipratropium (ATROVENT) 0.02 % nebulizer solution Take 2.5 mLs (0.5 mg total) by nebulization 4 (four) times daily. 11/16/18   [name], MD  ipratropium-albuterol (DUONEB) 0.5-2.5 (3) MG/3ML SOLN Take 3 mLs by nebulization every 6 (six) hours as needed. 04/27/16   [name], MD  losartan (COZAAR) 25 MG tablet TAKE 1/2 TABLET(12.5 MG) BY MOUTH DAILY 03/14/19   [name], MD  metoprolol succinate (TOPROL-XL) 50 MG 24 hr tablet take 1 tablet once daily with food 02/23/18   Ahmed Prima, [place] M, PA-C  nitroGLYCERIN (NITROSTAT) 0.4 MG SL tablet Place 1 tablet (0.4 mg total) under the tongue every 5 (five) minutes as needed for  chest pain. 05/29/16 11/06/19  Lendon Colonel, NP  potassium chloride SA (KLOR-CON M20) 20 MEQ tablet Take 1 tablet (20 mEq total) by mouth daily. 02/23/18   Strader, Fransisco Hertz, PA-C  promethazine-dextromethorphan (PROMETHAZINE-DM) 6.25-15 MG/5ML syrup One teaspoon at bedtime as needed,for cough 11/16/18   [name], MD  spironolactone (ALDACTONE) 25 MG tablet TAKE 1 TABLET BY MOUTH ONCE DAILY 08/10/18   [name], MD  tiotropium (SPIRIVA HANDIHALER) 18 MCG inhalation capsule inhale the contents of one capsule in the handihaler once daily 05/12/17   [name], MD    Physical Exam: Vitals:   04/05/19 0730 04/05/19 0745 04/05/19 0857 04/05/19 0909  BP: 114/77 118/71    Pulse: 88 86    Resp: (!) 24 (!) 21  Temp:   98.4 F (36.9 C)   TempSrc:   Oral   SpO2: 91% 92%    Weight:    94.9 kg  Height:    5\' 9"  (1.753 m)    Constitutional: NAD, calm, comfortable Vitals:   04/05/19 0730 04/05/19 0745 04/05/19 0857 04/05/19 0909  BP: 114/77 118/71    Pulse: 88 86    Resp: (!) 24 (!) 21    Temp:   98.4 F (36.9 C)   TempSrc:   Oral   SpO2: 91% 92%    Weight:    94.9 kg  Height:    5\' 9"  (1.753 m)   Eyes: lids and conjunctivae normal ENMT: Mucous membranes are moist.  Neck: normal, supple Respiratory: clear to auscultation bilaterally. Normal respiratory effort. No accessory muscle use.  Currently on 2 L nasal cannula. Cardiovascular: Regular rate and rhythm, no murmurs.  Scant bilateral edema. Abdomen: no tenderness, no distention. Bowel sounds positive.  Musculoskeletal:  No joint deformity upper and lower extremities.   Skin: no rashes, lesions, ulcers.  Psychiatric: Normal judgment and insight. Alert and oriented x 3. Normal mood.   Labs on Admission: I have personally reviewed following labs and imaging studies  CBC: Recent Labs  Lab 04/05/19 0554  WBC 11.7*  NEUTROABS 9.6*  HGB 14.5  HCT 46.8  MCV 90.7  PLT 301   Basic Metabolic Panel:  Recent Labs  Lab 04/05/19 0554  NA 138  K 3.5  CL 102  CO2 23  GLUCOSE 310*  BUN 22*  CREATININE 1.38*  CALCIUM 9.1   GFR: Estimated Creatinine Clearance: 66.4 mL/min (A) (by C-G formula based on SCr of 1.38 mg/dL (H)). Liver Function Tests: Recent Labs  Lab 04/05/19 0554  AST 73*  ALT 50*  ALKPHOS 87  BILITOT 0.6  PROT 7.9  ALBUMIN 3.9   No results for input(s): LIPASE, AMYLASE in the last 168 hours. No results for input(s): AMMONIA in the last 168 hours. Coagulation Profile: No results for input(s): INR, PROTIME in the last 168 hours. Cardiac Enzymes: No results for input(s): CKTOTAL, CKMB, CKMBINDEX, TROPONINI in the last 168 hours. BNP (last 3 results) No results for input(s): PROBNP in the last 8760 hours. HbA1C: No results for input(s): HGBA1C in the last 72 hours. CBG: Recent Labs  Lab 04/05/19 0859  GLUCAP 259*   Lipid Profile: Recent Labs    04/05/19 0554  TRIG 465*   Thyroid Function Tests: No results for input(s): TSH, T4TOTAL, FREET4, T3FREE, THYROIDAB in the last 72 hours. Anemia Panel: Recent Labs    04/05/19 0554  FERRITIN 245   Urine analysis:    Component Value Date/Time   COLORURINE COLORLESS (A) 09/06/2017 0452   APPEARANCEUR CLEAR 09/06/2017 0452   LABSPEC 1.006 09/06/2017 0452   PHURINE 5.0 09/06/2017 0452   GLUCOSEU >=500 (A) 09/06/2017 0452   GLUCOSEU NEG mg/dL 03/04/2007 0829   HGBUR NEGATIVE 09/06/2017 0452   HGBUR negative 11/13/2010 1522   [address] 09/06/2017 0452   [place] 09/06/2017 0452   PROTEINUR NEGATIVE 09/06/2017 0452   UROBILINOGEN 0.2 03/23/2015 1805   NITRITE NEGATIVE 09/06/2017 0452   LEUKOCYTESUR NEGATIVE 09/06/2017 0452    Radiological Exams on Admission: Dg Chest Port 1 View  Result Date: 04/05/2019 CLINICAL DATA:  59 year old male with shortness of breath. EXAM: PORTABLE CHEST 1 VIEW COMPARISON:  11/16/2018 and earlier. FINDINGS: Portable AP upright view at 0451 hours.  Stable left chest AICD. Stable cardiac size and mediastinal contours. Stable  to mildly lower lung volumes. New streaky right upper lung and patchy/indistinct right lower lung opacity. Similar patchy/indistinct opacity at the left lung base. Pulmonary vascularity does appear mildly increased and there is trace fluid or thickening in the right minor fissure. No dependent pleural effusion is evident. No pneumothorax. Negative visible bowel gas pattern. Visualized tracheal air column is within normal limits. No acute osseous abnormality identified. IMPRESSION: 1. New patchy and indistinct bilateral pulmonary opacity. Suggestion of superimposed increased vascularity and possible trace fluid in the right minor fissure. 2. Differential considerations include acute viral/atypical respiratory infection and interstitial edema with atelectasis. Electronically Signed   By: Genevie Ann M.D.   On: 04/05/2019 05:23    EKG: Independently reviewed. NSR 98bpm.  Assessment/Plan Principal Problem:   Acute hypoxemic respiratory failure (HCC) Active Problems:   Mixed hyperlipidemia   Essential hypertension   Acute on chronic combined systolic and diastolic CHF (congestive heart failure) (HCC)   DM type 2 causing vascular disease (HCC)   Pulmonary edema    1. Acute hypoxemic respiratory failure secondary to acute on chronic combined systolic and diastolic CHF-likely secondary to dietary indiscretion.  Prior echocardiogram in 2018 with no need to repeat at this time as this appears to be related to dietary indiscretion.  Advised patient to monitor low-sodium diet as well as weights at home which he does not check.  Continue on IV Lasix 40 mg twice daily while here and monitor diuresis with strict I's and O's as well as daily weights.  Wean oxygen as tolerated. 2. History of COPD.  No acute bronchospasms currently noted but will order duo nebs as needed and monitor closely. 3. Type 2 diabetes with hyperglycemia.  Appreciate  diabetes coordinator recommendations with recent hemoglobin A1c 11.1% on 01/2019.  Will start Lantus as well as SSI and mealtime coverage. 4. Essential hypertension.  Currently well controlled.  Will discontinue nitroglycerin drip.  Will hold amlodipine and Cozaar for now in the setting of aggressive diuresis and maintain on metoprolol.  Monitor carefully.  Labetalol will be ordered PRN severe elevations. 5. Dyslipidemia/CAD.  Maintain on statin and aspirin.   DVT prophylaxis: Lovenox Code Status: Full Family Communication: Wife on phone Disposition Plan:Diuresis Consults called:None Admission status: Inpatient, SDU    Darleen Crocker DO [facility] Pager [phone]  If 7PM-7AM, please contact night-coverage [email or web address] Password [facility]  04/05/2019, 9:27 AM

## 2019-04-05 NOTE — Progress Notes (Signed)
Patient admitted with hypertensive urgency and acute pulmonary edema.  2D echo ordered in ER.  Patient has known DCM with EF 25%.  [facility] has not evaluated patient yet so will hold off on echo for now until they have seen patient.

## 2019-04-05 NOTE — ED Notes (Signed)
ED TO INPATIENT HANDOFF REPORT  ED Nurse Name and Phone #: [phone]  S Name/Age/Gender Antonio Cobb 58 y.o. male Room/Bed: APA02/APA02  Code Status   Code Status: Full Code  Home/SNF/Other Home Patient oriented to: self, place, time and situation Is this baseline? Yes   Triage Complete: Triage complete  Chief Complaint shortness of breath  Triage Note Pt C/O Sob that woke him up from sleep tonight. Pt states he went to bed feeling normal and woke up SOB. Pt denies pain or fevers.    Allergies Allergies  Allergen Reactions  . Other     Seasonal allergies  - has to use inhaler    Level of Care/Admitting Diagnosis ED Disposition    ED Disposition Condition [facility]: [facility] [[id number]]  Level of Care: Stepdown [14]  Covid Evaluation: N/A  Diagnosis: Pulmonary edema [408144]  Admitting Physician: Reubin Milan [[id number]]  Attending Physician: Reubin Milan [[id number]]  PT Class (Do Not Modify): Observation [104]  PT Acc Code (Do Not Modify): Observation [10022]       B Medical/Surgery History Past Medical History:  Diagnosis Date  . Alcohol abuse   . CAD (coronary artery disease)    a. s/p inferolateral STEMI in 05/2012 with DES to LCx b. patent stent by cath in 05/2016 with moderate RCA and LAD disease  . Cardiomyopathy ([place])    a. LVEF 30-35% by echo in 09/2016. b. 08/2017: echo showing EF remains reduced at 25-30% c. s/p [employer] ICD placement in 01/2018  . COPD (chronic obstructive pulmonary disease) ([place])   . Diabetes mellitus, type 2 ([place])   . Essential hypertension   . Mitral regurgitation    Moderate  . Mixed hyperlipidemia   . Myocardial infarction ([place]) 05/23/2012   Inferolateral STEMI  . Obesity    Past Surgical History:  Procedure Laterality Date  . CARDIAC CATHETERIZATION  2 yrs ago  . CARDIAC CATHETERIZATION N/A 05/15/2016   Procedure: Right/Left Heart Cath and Coronary Angiography;   Surgeon: [name], MD;  Location: [place] [facility];  Service: Cardiovascular;  Laterality: N/A;  . CORONARY STENT PLACEMENT  05/23/12  . ICD IMPLANT N/A 02/01/2018   Procedure: ICD IMPLANT;  Surgeon: Evans Lance, MD;  Location: [place] [facility];  Service: Cardiovascular;  Laterality: N/A;  . LEFT HEART CATHETERIZATION WITH CORONARY ANGIOGRAM N/A 05/23/2012   Procedure: LEFT HEART CATHETERIZATION WITH CORONARY ANGIOGRAM;  Surgeon: [name], MD;  Location: [facility] CATH LAB;  Service: Cardiovascular;  Laterality: N/A;  . None    . PERCUTANEOUS CORONARY STENT INTERVENTION (PCI-S) N/A 05/23/2012   Procedure: PERCUTANEOUS CORONARY STENT INTERVENTION (PCI-S);  Surgeon: [name], MD;  Location: [facility] CATH LAB;  Service: Cardiovascular;  Laterality: N/A;  . POLYPECTOMY  10/16/2011   Procedure: POLYPECTOMY;  Surgeon: [name], MD;  Location: [facility];  Service: Endoscopy;;  Polypoid Lesion, Transverse and Sigmoid Colon     A IV Location/Drains/Wounds Patient Lines/Drains/Airways Status   Active Line/Drains/Airways    Name:   Placement date:   Placement time:   Site:   Days:   Peripheral IV 04/05/19 Right Antecubital   04/05/19    0429    Antecubital   less than 1   Incision (Closed) 02/01/18 Chest Left;Upper   02/01/18    2037     428          Intake/Output Last 24 hours  Intake/Output Summary (Last 24 hours) at  04/05/2019 0705 Last data filed at 04/05/2019 0557 Gross per 24 hour  Intake -  Output 1100 ml  Net -1100 ml    Labs/Imaging Results for orders placed or performed during the hospital encounter of 04/05/19 (from the past 48 hour(s))  SARS Coronavirus 2 [facility] order, Performed in [facility] hospital lab)     Status: None   Collection Time: 04/05/19  4:34 AM  Result Value Ref Range   SARS Coronavirus 2 NEGATIVE NEGATIVE    Comment: (NOTE) If result is NEGATIVE SARS-CoV-2 target nucleic acids are NOT DETECTED. The SARS-CoV-2 RNA is generally detectable  in upper and lower  respiratory specimens during the acute phase of infection. The lowest  concentration of SARS-CoV-2 viral copies this assay can detect is 250  copies / mL. A negative result does not preclude SARS-CoV-2 infection  and should not be used as the sole basis for treatment or other  patient management decisions.  A negative result may occur with  improper specimen collection / handling, submission of specimen other  than nasopharyngeal swab, presence of viral mutation(s) within the  areas targeted by this assay, and inadequate number of viral copies  (<250 copies / mL). A negative result must be combined with clinical  observations, patient history, and epidemiological information. If result is POSITIVE SARS-CoV-2 target nucleic acids are DETECTED. The SARS-CoV-2 RNA is generally detectable in upper and lower  respiratory specimens dur ing the acute phase of infection.  Positive  results are indicative of active infection with SARS-CoV-2.  Clinical  correlation with patient history and other diagnostic information is  necessary to determine patient infection status.  Positive results do  not rule out bacterial infection or co-infection with other viruses. If result is PRESUMPTIVE POSTIVE SARS-CoV-2 nucleic acids MAY BE PRESENT.   A presumptive positive result was obtained on the submitted specimen  and confirmed on repeat testing.  While 2019 novel coronavirus  (SARS-CoV-2) nucleic acids may be present in the submitted sample  additional confirmatory testing may be necessary for epidemiological  and / or clinical management purposes  to differentiate between  SARS-CoV-2 and other Sarbecovirus currently known to infect humans.  If clinically indicated additional testing with an alternate test  methodology [phone]) is advised. The SARS-CoV-2 RNA is generally  detectable in upper and lower respiratory sp ecimens during the acute  phase of infection. The expected result is  Negative. Fact Sheet for Patients:  StrictlyIdeas.no Fact Sheet for Healthcare Providers: BankingDealers.co.za This test is not yet approved or cleared by the [place] FDA and has been authorized for detection and/or diagnosis of SARS-CoV-2 by FDA under an Emergency Use Authorization (EUA).  This EUA will remain in effect (meaning this test can be used) for the duration of the COVID-19 declaration under Section 564(b)(1) of the Act, 21 U.S.C. section 360bbb-3(b)(1), unless the authorization is terminated or revoked sooner. Performed at [facility], [address], [place], [place] [address]   CBC WITH DIFFERENTIAL     Status: Abnormal   Collection Time: 04/05/19  5:54 AM  Result Value Ref Range   WBC 11.7 (H) 4.0 - 10.5 K/uL   RBC 5.16 4.22 - 5.81 MIL/uL   Hemoglobin 14.5 13.0 - 17.0 g/dL   HCT 46.8 39.0 - 52.0 %   MCV 90.7 80.0 - 100.0 fL   MCH 28.1 26.0 - 34.0 pg   MCHC 31.0 30.0 - 36.0 g/dL   RDW 12.5 11.5 - 15.5 %   Platelets 229 150 -  400 K/uL   nRBC 0.0 0.0 - 0.2 %   Neutrophils Relative % 83 %   Neutro Abs 9.6 (H) 1.7 - 7.7 K/uL   Lymphocytes Relative 10 %   Lymphs Abs 1.1 0.7 - 4.0 K/uL   Monocytes Relative 4 %   Monocytes Absolute 0.5 0.1 - 1.0 K/uL   Eosinophils Relative 2 %   Eosinophils Absolute 0.3 0.0 - 0.5 K/uL   Basophils Relative 1 %   Basophils Absolute 0.1 0.0 - 0.1 K/uL   Immature Granulocytes 0 %   Abs Immature Granulocytes 0.05 0.00 - 0.07 K/uL    Comment: Performed at [facility], [address] [place], [place] [address]  Comprehensive metabolic panel     Status: Abnormal   Collection Time: 04/05/19  5:54 AM  Result Value Ref Range   Sodium 138 135 - 145 mmol/L   Potassium 3.5 3.5 - 5.1 mmol/L   Chloride 102 98 - 111 mmol/L   CO2 23 22 - 32 mmol/L   Glucose, Bld 310 (H) 70 - 99 mg/dL   BUN 22 (H) 6 - 20 mg/dL   Creatinine, Ser 1.38 (H) 0.61 - 1.24 mg/dL   Calcium 9.1 8.9 - 10.3 mg/dL   Total  Protein 7.9 6.5 - 8.1 g/dL   Albumin 3.9 3.5 - 5.0 g/dL   AST 73 (H) 15 - 41 U/L   ALT 50 (H) 0 - 44 U/L   Alkaline Phosphatase 87 38 - 126 U/L   Total Bilirubin 0.6 0.3 - 1.2 mg/dL   GFR calc non Af Amer 56 (L) >60 mL/min   GFR calc Af Amer >60 >60 mL/min   Anion gap 13 5 - 15    Comment: Performed at [facility], [address] [place], [place] [address]  D-dimer, quantitative     Status: Abnormal   Collection Time: 04/05/19  5:54 AM  Result Value Ref Range   D-Dimer, Quant 1.03 (H) 0.00 - 0.50 ug/mL-FEU    Comment: (NOTE) At the manufacturer cut-off of 0.50 ug/mL FEU, this assay has been documented to exclude PE with a sensitivity and negative predictive value of 97 to 99%.  At this time, this assay has not been approved by the FDA to exclude DVT/VTE. Results should be correlated with clinical presentation. Performed at [facility], [address] [place] [address], [place], [place] [address]   Procalcitonin     Status: None   Collection Time: 04/05/19  5:54 AM  Result Value Ref Range   Procalcitonin <0.10 ng/mL    Comment:        Interpretation: PCT (Procalcitonin) <= 0.5 ng/mL: Systemic infection (sepsis) is not likely. Local bacterial infection is possible. (NOTE)       Sepsis PCT Algorithm           Lower Respiratory Tract                                      Infection PCT Algorithm    ----------------------------     ----------------------------         PCT < 0.25 ng/mL                PCT < 0.10 ng/mL         Strongly encourage             Strongly discourage   discontinuation of antibiotics    initiation of antibiotics    ----------------------------     -----------------------------  PCT 0.25 - 0.50 ng/mL            PCT 0.10 - 0.25 ng/mL               OR       >80% decrease in PCT            Discourage initiation of                                            antibiotics      Encourage discontinuation           of antibiotics    ----------------------------      -----------------------------         PCT >= 0.50 ng/mL              PCT 0.26 - 0.50 ng/mL               AND        <80% decrease in PCT             Encourage initiation of                                             antibiotics       Encourage continuation           of antibiotics    ----------------------------     -----------------------------        PCT >= 0.50 ng/mL                  PCT > 0.50 ng/mL               AND         increase in PCT                  Strongly encourage                                      initiation of antibiotics    Strongly encourage escalation           of antibiotics                                     -----------------------------                                           PCT <= 0.25 ng/mL                                                 OR                                        > 80% decrease in PCT  Discontinue / Do not initiate                                             antibiotics Performed at [facility], [address] [place], [place] [address]   Lactate dehydrogenase     Status: Abnormal   Collection Time: 04/05/19  5:54 AM  Result Value Ref Range   LDH 262 (H) 98 - 192 U/L    Comment: Performed at [facility], [address] [place], [place] [address]  Ferritin     Status: None   Collection Time: 04/05/19  5:54 AM  Result Value Ref Range   Ferritin 245 24 - 336 ng/mL    Comment: Performed at [facility], [address], [place], [place] [address]  Triglycerides     Status: Abnormal   Collection Time: 04/05/19  5:54 AM  Result Value Ref Range   Triglycerides 465 (H) <150 mg/dL    Comment: Performed at  [facility], [address] [place] [address] [place], [place] [address]  Fibrinogen     Status: None   Collection Time: 04/05/19  5:54 AM  Result Value Ref Range   Fibrinogen 432 210 - 475 mg/dL    Comment: Performed at [facility], [address], [place], [place] [address]  C-reactive protein     Status: Abnormal    Collection Time: 04/05/19  5:54 AM  Result Value Ref Range   CRP 1.4 (H) <1.0 mg/dL    Comment: Performed at [facility], [address] [place], [place] [address]  Brain natriuretic peptide     Status: Abnormal   Collection Time: 04/05/19  5:54 AM  Result Value Ref Range   B Natriuretic Peptide 231.0 (H) 0.0 - 100.0 pg/mL    Comment: Performed at [facility], [address], [place], [place] [address]   Dg Chest Port 1 View  Result Date: 04/05/2019 CLINICAL DATA:  58 year old male with shortness of breath. EXAM: PORTABLE CHEST 1 VIEW COMPARISON:  11/16/2018 and earlier. FINDINGS: Portable AP upright view at 0451 hours. Stable left chest AICD. Stable cardiac size and mediastinal contours. Stable to mildly lower lung volumes. New streaky right upper lung and patchy/indistinct right lower lung opacity. Similar patchy/indistinct opacity at the left lung base. Pulmonary vascularity does appear mildly increased and there is trace fluid or thickening in the right minor fissure. No dependent pleural effusion is evident. No pneumothorax. Negative visible bowel gas pattern. Visualized tracheal air column is within normal limits. No acute osseous abnormality identified. IMPRESSION: 1. New patchy and indistinct bilateral pulmonary opacity. Suggestion of superimposed increased vascularity and possible trace fluid in the right minor fissure. 2. Differential considerations include acute viral/atypical respiratory infection and interstitial edema with atelectasis. Electronically Signed   By: Genevie Ann M.D.   On: 04/05/2019 05:23    Pending Labs Unresulted Labs (From admission, onward)    Start     Ordered   04/12/19 0500  Creatinine, serum  (enoxaparin (LOVENOX)    CrCl >/= 30 ml/min)  Weekly,   R    Comments:  while on enoxaparin therapy    04/05/19 0646   04/06/19 0500  HIV antibody (Routine Testing)  Tomorrow morning,   R     04/05/19 0646   04/06/19 7026  Basic metabolic panel  Daily,   R      04/05/19 3785  Vitals/Pain Today's Vitals   04/05/19 0640 04/05/19 0645 04/05/19 0650 04/05/19 0655  BP: 132/85 (!) 141/90 (!) 146/80 (!) 119/94  Pulse: 85 87 87 86  Resp: 15 (!) 21 (!) 24 19  Temp:      TempSrc:      SpO2: 99% 96% 95% 94%  PainSc:        Isolation Precautions No active isolations  Medications Medications  nitroGLYCERIN 50 mg in dextrose 5 % 250 mL (0.2 mg/mL) infusion (65 mcg/min Intravenous Rate/Dose Change 04/05/19 0702)  enoxaparin (LOVENOX) injection 50 mg (has no administration in time range)  losartan (COZAAR) tablet 25 mg (has no administration in time range)  furosemide (LASIX) injection 80 mg (80 mg Intravenous Given 04/05/19 0442)    Mobility walks Low fall risk   Focused Assessments    R Recommendations: See Admitting Provider Note  Report given to:   Additional Notes:  o2 via [place] 2 L

## 2019-04-05 NOTE — ED Notes (Signed)
CONTACT INFO:  [name] (wife): [phone]

## 2019-04-06 DIAGNOSIS — J9601 Acute respiratory failure with hypoxia: Secondary | ICD-10-CM | POA: Diagnosis not present

## 2019-04-06 LAB — BASIC METABOLIC PANEL
Anion gap: 10 (ref 5–15)
BUN: 27 mg/dL — ABNORMAL HIGH (ref 6–20)
CO2: 26 mmol/L (ref 22–32)
Calcium: 8.8 mg/dL — ABNORMAL LOW (ref 8.9–10.3)
Chloride: 104 mmol/L (ref 98–111)
Creatinine, Ser: 1.3 mg/dL — ABNORMAL HIGH (ref 0.61–1.24)
GFR calc Af Amer: >60 mL/min (ref >60)
GFR calc non Af Amer: >60 mL/min (ref >60)
Glucose, Bld: 179 mg/dL — ABNORMAL HIGH (ref 70–99)
Potassium: 3.6 mmol/L (ref 3.5–5.1)
Sodium: 140 mmol/L (ref 135–145)

## 2019-04-06 LAB — CBC
HCT: 42.6 % (ref 39.0–52.0)
Hemoglobin: 13.9 g/dL (ref 13.0–17.0)
MCH: 28.5 pg (ref 26.0–34.0)
MCHC: 32.6 g/dL (ref 30.0–36.0)
MCV: 87.3 fL (ref 80.0–100.0)
Platelets: 231 10*3/uL (ref 150–400)
RBC: 4.88 MIL/uL (ref 4.22–5.81)
RDW: 12.5 % (ref 11.5–15.5)
WBC: 13.2 10*3/uL — ABNORMAL HIGH (ref 4.0–10.5)
nRBC: 0 % (ref 0.0–0.2)

## 2019-04-06 LAB — GLUCOSE, CAPILLARY: Glucose-Capillary: 165 mg/dL — ABNORMAL HIGH (ref 70–99)

## 2019-04-06 LAB — MAGNESIUM: Magnesium: 2.1 mg/dL (ref 1.7–2.4)

## 2019-04-06 MED ORDER — ATORVASTATIN CALCIUM 40 MG PO TABS: 20.0000 mg | ORAL_TABLET | Freq: Every day | ORAL | 3 refills | Status: DC

## 2019-04-06 MED ORDER — ENOXAPARIN SODIUM 40 MG/0.4ML ~~LOC~~ SOLN: 40.0000 mg | SUBCUTANEOUS | Status: DC

## 2019-04-06 NOTE — Discharge Summary (Signed)
Physician Discharge Summary  [name] DJM:[id number] DOB: Jun 12, 1961 DOA: 04/05/2019  PCP: [name], MD  Admit date: 04/05/2019  Discharge date: 04/06/2019  Admitted From:Home  Disposition:  Home  Recommendations for Outpatient Follow-up:  1. Follow up with PCP in 1-2 weeks 2. Continue to monitor daily weights and watch diet to ensure low sodium  Home Health:N/A  Equipment/Devices:None  Discharge Condition:Stable  CODE STATUS: Full  Diet recommendation: Heart Healthy/Carb Modified  Brief/Interim Summary: Per HPI: Antonio Cobb is a 58 y.o. male with medical history significant for chronic combined systolic and diastolic heart failure with prior echo in 2018 with LVEF 25-30% and grade 2 diastolic dysfunction with mild LVH, type 2 diabetes, hypertension, dyslipidemia, and COPD who presented to the ED early this morning after he noticed an abrupt onset of shortness of breath upon waking up and going to the bathroom to urinate.  He denies any chest pain, fever, chills, or cough and states that he has been compliant on his home medications to include Lasix and spironolactone.  He states that he does not monitor his fluid or sodium intake very well and does appear to have some dietary indiscretion.  He states that he has had barbecue chicken as well as pork rinds and other salty foods recently.  Patient was admitted for acute hypoxemic respiratory failure secondary to acute on chronic combined CHF.  This was most likely due to dietary indiscretion as patient does eat significant amounts of sodium containing foods like pork rinds and barbecue chicken.  I have discussed the importance of eating a better diet with him as well as checking daily weights.  He understands this and will continue to take his medications as otherwise prescribed and call his PCP for significant weight gain as needed.  He has diuresed approximately 3 L of fluid during this admission and has returned  to his supposed baseline weight of 206 pounds.  He is otherwise feeling well and has no further edema, shortness of breath and has ambulated without any need for oxygen with saturations in the mid 90th percentile.  He is otherwise stable for discharge.  Discharge Diagnoses:  Principal Problem:   Acute hypoxemic respiratory failure (HCC) Active Problems:   Mixed hyperlipidemia   Essential hypertension   Acute on chronic combined systolic and diastolic CHF (congestive heart failure) (HCC)   DM type 2 causing vascular disease ([place])   Pulmonary edema  Principal discharge diagnosis: Acute hypoxemic respiratory failure secondary to acute on chronic combined systolic and diastolic CHF secondary to dietary indiscretion.  Discharge Instructions  Discharge Instructions    Diet - low sodium heart healthy   Complete by:  As directed    Increase activity slowly   Complete by:  As directed      Allergies as of 04/06/2019      Reactions   Other    Seasonal allergies  - has to use inhaler      Medication List    TAKE these medications   albuterol 108 (90 Base) MCG/ACT inhaler Commonly known as:  VENTOLIN HFA Inhale 2 puffs into the lungs every 6 (six) hours as needed for wheezing or shortness of breath.   amLODipine 10 MG tablet Commonly known as:  NORVASC Take 1 tablet (10 mg total) by mouth daily.   Aspirin Adult Low Strength 81 MG EC tablet Generic drug:  aspirin Take 81 mg by mouth daily.   atorvastatin 40 MG tablet Commonly known as:  LIPITOR Take 0.5  tablets (20 mg total) by mouth daily.   budesonide-formoterol 80-4.5 MCG/ACT inhaler Commonly known as:  SYMBICORT Inhale 2 puffs into the lungs 2 (two) times daily.   FREESTYLE LITE test strip Generic drug:  glucose blood TEST FOUR TIMES DAILY   furosemide 40 MG tablet Commonly known as:  LASIX Take 1 tablet (40 mg total) by mouth 2 (two) times daily.   Insulin Glargine 100 UNIT/ML Solostar Pen Commonly known as:   Lantus SoloStar Inject 50 Units into the skin at bedtime. Everynight   insulin regular 100 units/mL injection Commonly known as:  NOVOLIN R Inject 0.1 mLs (10 Units total) into the skin 3 (three) times daily before meals.   ipratropium 0.02 % nebulizer solution Commonly known as:  ATROVENT Take 2.5 mLs (0.5 mg total) by nebulization 4 (four) times daily.   ipratropium-albuterol 0.5-2.5 (3) MG/3ML Soln Commonly known as:  DUONEB Take 3 mLs by nebulization every 6 (six) hours as needed.   losartan 25 MG tablet Commonly known as:  COZAAR TAKE 1/2 TABLET(12.5 MG) BY MOUTH DAILY   metoprolol succinate 50 MG 24 hr tablet Commonly known as:  TOPROL-XL take 1 tablet once daily with food   mometasone-formoterol 100-5 MCG/ACT Aero Commonly known as:  DULERA Inhale 2 puffs into the lungs 2 (two) times daily.   nitroGLYCERIN 0.4 MG SL tablet Commonly known as:  Nitrostat Place 1 tablet (0.4 mg total) under the tongue every 5 (five) minutes as needed for chest pain.   potassium chloride SA 20 MEQ tablet Commonly known as:  Klor-Con M20 Take 1 tablet (20 mEq total) by mouth daily.   promethazine-dextromethorphan 6.25-15 MG/5ML syrup Commonly known as:  PROMETHAZINE-DM One teaspoon at bedtime as needed,for cough   rosuvastatin 40 MG tablet Commonly known as:  CRESTOR Take 1 tablet by mouth daily.   spironolactone 25 MG tablet Commonly known as:  ALDACTONE TAKE 1 TABLET BY MOUTH ONCE DAILY   tiotropium 18 MCG inhalation capsule Commonly known as:  Spiriva HandiHaler inhale the contents of one capsule in the handihaler once daily      Follow-up Information    [name], MD Follow up in 1 week(s).   Specialty:  Family Medicine Contact information: [address] [place] [address] [phone]        Arnoldo Lenis, MD .   Specialty:  Cardiology Contact information: [place] [address] [phone]           Allergies  Allergen Reactions  . Other     Seasonal allergies  - has to use inhaler    Consultations:  None   Procedures/Studies: Dg Chest Port 1 View  Result Date: 04/05/2019 CLINICAL DATA:  58 year old male with shortness of breath. EXAM: PORTABLE CHEST 1 VIEW COMPARISON:  11/16/2018 and earlier. FINDINGS: Portable AP upright view at 0451 hours. Stable left chest AICD. Stable cardiac size and mediastinal contours. Stable to mildly lower lung volumes. New streaky right upper lung and patchy/indistinct right lower lung opacity. Similar patchy/indistinct opacity at the left lung base. Pulmonary vascularity does appear mildly increased and there is trace fluid or thickening in the right minor fissure. No dependent pleural effusion is evident. No pneumothorax. Negative visible bowel gas pattern. Visualized tracheal air column is within normal limits. No acute osseous abnormality identified. IMPRESSION: 1. New patchy and indistinct bilateral pulmonary opacity. Suggestion of superimposed increased vascularity and possible trace fluid in the right minor fissure. 2. Differential considerations include acute  viral/atypical respiratory infection and interstitial edema with atelectasis. Electronically Signed   By: Genevie Ann M.D.   On: 04/05/2019 05:23     Discharge Exam: Vitals:   04/06/19 0723 04/06/19 0725  BP:    Pulse:    Resp:    Temp:  98.3 F (36.8 C)  SpO2: 95% 95%   Vitals:   04/06/19 0500 04/06/19 0600 04/06/19 0723 04/06/19 0725  BP: (!) 134/94 (!) 127/93    Pulse: 88 73    Resp: 20 13    Temp:    98.3 F (36.8 C)  TempSrc:    Axillary  SpO2: 92% 95% 95% 95%  Weight: 93.5 kg     Height:        General: Pt is alert, awake, not in acute distress Cardiovascular: RRR, S1/S2 +, no rubs, no gallops Respiratory: CTA bilaterally, no wheezing, no rhonchi Abdominal: Soft, NT, ND, bowel sounds + Extremities: no edema, no cyanosis    The results of significant diagnostics from  this hospitalization (including imaging, microbiology, ancillary and laboratory) are listed below for reference.     Microbiology: Recent Results (from the past 240 hour(s))  SARS Coronavirus 2 [facility] order, Performed in [facility] hospital lab)     Status: None   Collection Time: 04/05/19  4:34 AM  Result Value Ref Range Status   SARS Coronavirus 2 NEGATIVE NEGATIVE Final    Comment: (NOTE) If result is NEGATIVE SARS-CoV-2 target nucleic acids are NOT DETECTED. The SARS-CoV-2 RNA is generally detectable in upper and lower  respiratory specimens during the acute phase of infection. The lowest  concentration of SARS-CoV-2 viral copies this assay can detect is 250  copies / mL. A negative result does not preclude SARS-CoV-2 infection  and should not be used as the sole basis for treatment or other  patient management decisions.  A negative result may occur with  improper specimen collection / handling, submission of specimen other  than nasopharyngeal swab, presence of viral mutation(s) within the  areas targeted by this assay, and inadequate number of viral copies  (<250 copies / mL). A negative result must be combined with clinical  observations, patient history, and epidemiological information. If result is POSITIVE SARS-CoV-2 target nucleic acids are DETECTED. The SARS-CoV-2 RNA is generally detectable in upper and lower  respiratory specimens dur ing the acute phase of infection.  Positive  results are indicative of active infection with SARS-CoV-2.  Clinical  correlation with patient history and other diagnostic information is  necessary to determine patient infection status.  Positive results do  not rule out bacterial infection or co-infection with other viruses. If result is PRESUMPTIVE POSTIVE SARS-CoV-2 nucleic acids MAY BE PRESENT.   A presumptive positive result was obtained on the submitted specimen  and confirmed on repeat testing.  While 2019 novel coronavirus   (SARS-CoV-2) nucleic acids may be present in the submitted sample  additional confirmatory testing may be necessary for epidemiological  and / or clinical management purposes  to differentiate between  SARS-CoV-2 and other Sarbecovirus currently known to infect humans.  If clinically indicated additional testing with an alternate test  methodology [phone]) is advised. The SARS-CoV-2 RNA is generally  detectable in upper and lower respiratory sp ecimens during the acute  phase of infection. The expected result is Negative. Fact Sheet for Patients:  StrictlyIdeas.no Fact Sheet for Healthcare Providers: BankingDealers.co.za This test is not yet approved or cleared by the [place] FDA and has been authorized for detection  and/or diagnosis of SARS-CoV-2 by FDA under an Emergency Use Authorization (EUA).  This EUA will remain in effect (meaning this test can be used) for the duration of the COVID-19 declaration under Section 564(b)(1) of the Act, 21 U.S.C. section 360bbb-3(b)(1), unless the authorization is terminated or revoked sooner. Performed at [facility], [address] [place], [place] [address]   MRSA PCR Screening     Status: None   Collection Time: 04/05/19  8:15 PM  Result Value Ref Range Status   MRSA by PCR NEGATIVE NEGATIVE Final    Comment:        The GeneXpert MRSA Assay (FDA approved for NASAL specimens only), is one component of a comprehensive MRSA colonization surveillance program. It is not intended to diagnose MRSA infection nor to guide or monitor treatment for MRSA infections. Performed at [facility], [address], [place], [place] [address]      Labs: BNP (last 3 results) Recent Labs    04/05/19 0554  BNP 098.1*   Basic Metabolic Panel: Recent Labs  Lab 04/05/19 0554 04/06/19 0604  NA 138 140  K 3.5 3.6  CL 102 104  CO2 23 26  GLUCOSE 310* 179*  BUN 22* 27*  CREATININE 1.38* 1.30*   CALCIUM 9.1 8.8*  MG  --  2.1   Liver Function Tests: Recent Labs  Lab 04/05/19 0554  AST 73*  ALT 50*  ALKPHOS 87  BILITOT 0.6  PROT 7.9  ALBUMIN 3.9   No results for input(s): LIPASE, AMYLASE in the last 168 hours. No results for input(s): AMMONIA in the last 168 hours. CBC: Recent Labs  Lab 04/05/19 0554 04/06/19 0604  WBC 11.7* 13.2*  NEUTROABS 9.6*  --   HGB 14.5 13.9  HCT 46.8 42.6  MCV 90.7 87.3  PLT 229 231   Cardiac Enzymes: No results for input(s): CKTOTAL, CKMB, CKMBINDEX, TROPONINI in the last 168 hours. BNP: Invalid input(s): POCBNP CBG: Recent Labs  Lab 04/05/19 0859 04/05/19 1120 04/05/19 1631 04/05/19 2111 04/06/19 0810  GLUCAP 259* 330* 266* 284* 165*   D-Dimer Recent Labs    04/05/19 0554  DDIMER 1.03*   Hgb A1c No results for input(s): HGBA1C in the last 72 hours. Lipid Profile Recent Labs    04/05/19 0554  TRIG 465*   Thyroid function studies No results for input(s): TSH, T4TOTAL, T3FREE, THYROIDAB in the last 72 hours.  Invalid input(s): FREET3 Anemia work up Recent Labs    04/05/19 0554  FERRITIN 245   Urinalysis    Component Value Date/Time   COLORURINE COLORLESS (A) 09/06/2017 [place] 09/06/2017 0452   LABSPEC 1.006 09/06/2017 0452   PHURINE 5.0 09/06/2017 0452   GLUCOSEU >=500 (A) 09/06/2017 0452   GLUCOSEU NEG mg/dL 03/04/2007 0829   HGBUR NEGATIVE 09/06/2017 0452   HGBUR negative 11/13/2010 1522   BILIRUBINUR NEGATIVE 09/06/2017 0452   KETONESUR NEGATIVE 09/06/2017 0452   PROTEINUR NEGATIVE 09/06/2017 0452   UROBILINOGEN 0.2 03/23/2015 1805   NITRITE NEGATIVE 09/06/2017 0452   LEUKOCYTESUR NEGATIVE 09/06/2017 0452   Sepsis Labs Invalid input(s): PROCALCITONIN,  WBC,  LACTICIDVEN Microbiology Recent Results (from the past 240 hour(s))  SARS Coronavirus 2 [facility] order, Performed in [place] hospital lab)     Status: None   Collection Time: 04/05/19  4:34 AM  Result Value Ref  Range Status   SARS Coronavirus 2 NEGATIVE NEGATIVE Final    Comment: (NOTE) If result is NEGATIVE SARS-CoV-2 target nucleic acids are NOT  DETECTED. The SARS-CoV-2 RNA is generally detectable in upper and lower  respiratory specimens during the acute phase of infection. The lowest  concentration of SARS-CoV-2 viral copies this assay can detect is 250  copies / mL. A negative result does not preclude SARS-CoV-2 infection  and should not be used as the sole basis for treatment or other  patient management decisions.  A negative result may occur with  improper specimen collection / handling, submission of specimen other  than nasopharyngeal swab, presence of viral mutation(s) within the  areas targeted by this assay, and inadequate number of viral copies  (<250 copies / mL). A negative result must be combined with clinical  observations, patient history, and epidemiological information. If result is POSITIVE SARS-CoV-2 target nucleic acids are DETECTED. The SARS-CoV-2 RNA is generally detectable in upper and lower  respiratory specimens dur ing the acute phase of infection.  Positive  results are indicative of active infection with SARS-CoV-2.  Clinical  correlation with patient history and other diagnostic information is  necessary to determine patient infection status.  Positive results do  not rule out bacterial infection or co-infection with other viruses. If result is PRESUMPTIVE POSTIVE SARS-CoV-2 nucleic acids MAY BE PRESENT.   A presumptive positive result was obtained on the submitted specimen  and confirmed on repeat testing.  While 2019 novel coronavirus  (SARS-CoV-2) nucleic acids may be present in the submitted sample  additional confirmatory testing may be necessary for epidemiological  and / or clinical management purposes  to differentiate between  SARS-CoV-2 and other Sarbecovirus currently known to infect humans.  If clinically indicated additional testing with an  alternate test  methodology [phone]) is advised. The SARS-CoV-2 RNA is generally  detectable in upper and lower respiratory sp ecimens during the acute  phase of infection. The expected result is Negative. Fact Sheet for Patients:  StrictlyIdeas.no Fact Sheet for Healthcare Providers: BankingDealers.co.za This test is not yet approved or cleared by the [place] FDA and has been authorized for detection and/or diagnosis of SARS-CoV-2 by FDA under an Emergency Use Authorization (EUA).  This EUA will remain in effect (meaning this test can be used) for the duration of the COVID-19 declaration under Section 564(b)(1) of the Act, 21 U.S.C. section 360bbb-3(b)(1), unless the authorization is terminated or revoked sooner. Performed at [facility], [address], [place], [place] [address]   MRSA PCR Screening     Status: None   Collection Time: 04/05/19  8:15 PM  Result Value Ref Range Status   MRSA by PCR NEGATIVE NEGATIVE Final    Comment:        The GeneXpert MRSA Assay (FDA approved for NASAL specimens only), is one component of a comprehensive MRSA colonization surveillance program. It is not intended to diagnose MRSA infection nor to guide or monitor treatment for MRSA infections. Performed at [facility], [address] [place], [place] [address]      Time coordinating discharge: 35 minutes  SIGNED:   Rodena Goldmann, DO [facility] 04/06/2019, 8:34 AM  If 7PM-7AM, please contact night-coverage [email or web address] Password [email or web address]

## 2019-04-07 ENCOUNTER — Telehealth: Payer: Self-pay

## 2019-04-07 LAB — HIV ANTIBODY (ROUTINE TESTING W REFLEX): HIV Screen 4th Generation wRfx: NONREACTIVE

## 2019-04-07 NOTE — Telephone Encounter (Signed)
Transition Care Management Follow-up Telephone Call   Date discharged? 04/06/2019                How have you been since you were released from the hospital? a little tired and weak but ok   Do you understand why you were in the hospital? CHF   Do you understand the discharge instructions? yes   Where were you discharged to?  home    Items Reviewed:  Medications reviewed: yes  Allergies reviewed: yes  Dietary changes reviewed: heart healthy low sodium  Referrals reviewed: no new referrals   Functional Questionnaire:   Activities of Daily Living (ADLs):  yes, has help if he needs it    Any transportation issues/concerns?: no   Any patient concerns? no   Confirmed importance and date/time of follow-up visits scheduled will call back to schedule due to patients work schedule     Confirmed with patient if condition begins to worsen call PCP or go to the ER.  Patient was given the office number and encouraged to call back with question or concerns.  yes with verbal understanding

## 2019-04-10 ENCOUNTER — Telehealth: Payer: Self-pay

## 2019-04-10 NOTE — Telephone Encounter (Signed)
2nd attempt - no answer

## 2019-04-10 NOTE — Telephone Encounter (Signed)
1st attempt- no answer    Transition Care Management Follow-up Telephone Call   Date discharged?              How have you been since you were released from the hospital?    Do you understand why you were in the hospital?    Do you understand the discharge instructions?   Where were you discharged to?    Items Reviewed:  Medications reviewed:   Allergies reviewed:   Dietary changes reviewed:   Referrals reviewed:    Functional Questionnaire:   Activities of Daily Living (ADLs):      Any transportation issues/concerns?:    Any patient concerns?    Confirmed importance and date/time of follow-up visits scheduled      Confirmed with patient if condition begins to worsen call PCP or go to the ER.  Patient was given the office number and encouraged to call back with question or concerns.  :

## 2019-04-11 ENCOUNTER — Ambulatory Visit (INDEPENDENT_AMBULATORY_CARE_PROVIDER_SITE_OTHER): Payer: BLUE CROSS/BLUE SHIELD | Admitting: Family Medicine

## 2019-04-11 ENCOUNTER — Encounter: Payer: Self-pay | Admitting: Family Medicine

## 2019-04-11 VITALS — BP 130/70 | Ht 69.0 in | Wt 204.0 lb

## 2019-04-11 DIAGNOSIS — IMO0001 Reserved for inherently not codable concepts without codable children: Secondary | ICD-10-CM

## 2019-04-11 DIAGNOSIS — E1165 Type 2 diabetes mellitus with hyperglycemia: Secondary | ICD-10-CM | POA: Diagnosis not present

## 2019-04-11 DIAGNOSIS — Z09 Encounter for follow-up examination after completed treatment for conditions other than malignant neoplasm: Secondary | ICD-10-CM

## 2019-04-11 DIAGNOSIS — Z794 Long term (current) use of insulin: Secondary | ICD-10-CM | POA: Diagnosis not present

## 2019-04-11 DIAGNOSIS — I5043 Acute on chronic combined systolic (congestive) and diastolic (congestive) heart failure: Secondary | ICD-10-CM | POA: Diagnosis not present

## 2019-04-11 MED ORDER — POTASSIUM CHLORIDE CRYS ER 20 MEQ PO TBCR: 20.0000 meq | EXTENDED_RELEASE_TABLET | Freq: Every day | ORAL | 3 refills | Status: DC

## 2019-04-11 NOTE — Patient Instructions (Addendum)
F/U as before, call if you need me sooner  Weigh daily, if you gain 1 or 2 pounds, then you need to reduce water and salt intake as you  Are heading for heart failure, which is NOT good  Please get fasting lipid, cmp and eGFR and PSA the first week in June  Potassium is prescribed, as we discussed   Seems as though your sugars are much improved, if you follow the diet and keep in close touch with Dr Dorris Fetch, then this WILL result in you getting control of your diabetes which is vital for your health  It is important that you exercise regularly at least 30 minutes 5 times a week. If you develop chest pain, have severe difficulty breathing, or feel very tired, stop exercising immediately and seek medical attention  Thanks for choosing [facility], we consider it a privelige to serve you.      Heart Failure  Heart failure means your heart has trouble pumping blood. This makes it hard for your body to work well. Heart failure is usually a long-term (chronic) condition. You must take good care of yourself and follow your treatment plan from your doctor. Follow these instructions at home: Medicines  Take over-the-counter and prescription medicines only as told by your doctor. ? Do not stop taking your medicine unless your doctor told you to do that. ? Do not skip any doses. ? Refill your prescriptions before you run out of medicine. You need your medicines every day. Eating and drinking   Eat heart-healthy foods. Talk with a diet and nutrition specialist (dietitian) to make an eating plan.  Choose foods that: ? Have no trans fat. ? Are low in saturated fat and cholesterol.  Choose healthy foods, like: ? Fresh or frozen fruits and vegetables. ? Fish. ? Low-fat (lean) meats. ? Legumes (like beans, peas, and lentils). ? Fat-free or low-fat dairy products. ? Whole-grain foods. ? High-fiber foods.  Limit salt (sodium) if told by your doctor. Ask your nutrition specialist  to recommend heart-healthy seasonings.  Cook in healthy ways instead of frying. Healthy ways of cooking include: ? Roasting. ? Grilling. ? Broiling. ? Baking. ? Poaching. ? Steaming. ? Stir-frying.  Limit how much fluid you drink, if told by your doctor. Lifestyle  Do not smoke or use chewing tobacco. Do not use nicotine gum or patches before talking to your doctor.  Limit alcohol intake to no more than 1 drink a day for non-pregnant women and 2 drinks a day for men. One drink equals 12 oz of beer, 5 oz of wine, or 1 oz of hard liquor. ? Tell your doctor if you drink alcohol many times a week. ? Talk with your doctor about whether any alcohol is safe for you. ? You should stop drinking alcohol: ? If your heart has been damaged by alcohol. ? You have very bad heart failure.  Do not use illegal drugs.  Lose weight if told by your doctor.  Do moderate physical activity if told by your doctor. Ask your doctor what activities are safe for you if: ? You are of older age (elderly). ? You have very bad heart failure. Keep track of important information  Weigh yourself every day. ? Weigh yourself every morning after you pee (urinate) and before breakfast. ? Wear the same amount of clothing each time. ? Write down your daily weight. Give your record to your doctor.  Check and write down your blood pressure as told by your  doctor.  Check your pulse as told by your doctor. Dealing with heat and cold  If the weather is very hot: ? Avoid activity that takes a lot of energy. ? Use air conditioning or fans, or find a cooler place. ? Avoid caffeine. ? Avoid alcohol. ? Wear clothing that is loose-fitting, lightweight, and light-colored.  If the weather is very cold: ? Avoid activity that takes a lot of energy. ? Layer your clothes. ? Wear mittens or gloves, a hat, and a scarf when you go outside. ? Avoid alcohol. General instructions  Manage other conditions that you have as  told by your doctor.  Learn to manage stress. If you need help, ask your doctor.  Plan rest periods for when you get tired.  Get education and support as needed.  Get rehab (rehabilitation) to help you stay independent and to help with everyday tasks.  Stay up to date with shots (immunizations), especially pneumococcal and flu (influenza) shots.  Keep all follow-up visits as told by your doctor. This is important. Contact a doctor if:  You gain weight quickly.  You are more short of breath than normal.  You cannot do your normal activities.  You tire easily.  You cough more than normal, especially with activity.  You have any or more puffiness (swelling) in areas such as your hands, feet, ankles, or belly (abdomen).  You cannot sleep because it is hard to breathe.  You feel like your heart is beating fast (palpitations).  You get dizzy or light-headed when you stand up. Get help right away if:  You have trouble breathing.  You or someone else notices a change in your awareness. This could be trouble staying awake or trouble concentrating.  You have chest pain or discomfort.  You pass out (faint). Summary  Heart failure means your heart has trouble pumping blood.  Make sure you refill your prescriptions before you run out of medicine. You need your medicines every day.  Keep records of your weight and blood pressure to give to your doctor.  Contact a doctor if you gain weight quickly. This information is not intended to replace advice given to you by your health care provider. Make sure you discuss any questions you have with your health care provider. Document Released: 09/08/2008 Document Revised: 08/24/2018 Document Reviewed: 12/22/2016 Elsevier Interactive Patient Education  2019 [employer].

## 2019-04-11 NOTE — Progress Notes (Signed)
Virtual Visit via Telephone Note  I connected with Antonio Cobb on 04/11/19 at  3:20 PM EDT by telephone and verified that I am speaking with the correct person using two identifiers.   I discussed the limitations, risks, security and privacy concerns of performing an evaluation and management service by telephone and the availability of in person appointments. I also discussed with the patient that there may be a patient responsible charge related to this service. The patient expressed understanding and agreed to proceed. Pt is in his home and I am at the office   History of Present Illness: Hospitalized from 4/22 to 4/23//2020 with acute respiratory failure  Due to acute on chronic combined heart failure. Reports dietary indiscretion was the trigger, with increased salt intake and water intake This has happened in the past, states he will not repeat behavior. Also continues  To have elevated and uncontrolled blood sugar, and needs to keep follow up with Endo Denies recent fever or chills. Denies sinus pressure, nasal congestion, ear pain or sore throat. Denies chest congestion, productive cough or wheezing.Dyspnea resolved following  Recent hospitalizatio Denies chest pains, palpitations and leg swelling Denies abdominal pain, nausea, vomiting,diarrhea or constipation.   Denies dysuria, frequency, hesitancy or incontinence. Denies joint pain, swelling and limitation in mobility. Denies headaches, seizures, numbness, or tingling. Denies depression, anxiety or insomnia. Denies skin break down or rash. C/o polyuria and polydipsia Reports decreased though persistent alcohol use       Observations/Objective: BP 130/70   Ht 5\' 9"  (1.753 m)   Wt 204 lb (92.5 kg)   BMI 30.13 kg/m  Good communication with no confusion and intact memory. Alert and oriented x 3 No signs of respiratory distress during sppech    Assessment and Plan:  Hospital discharge follow-up Patient in for  follow up of recent hospitalization. Discharge summary, and laboratory and radiology data are reviewed, and any questions or concerns about recent hospitalization are discussed. Specific issues requiring follow up are specifically addressed.   Diabetes mellitus, insulin dependent (IDDM), uncontrolled ([place]) Reminded of extreme importance in following diet and treatment plan , and keeping f/u appt with Endo, states blood sugar has improved with new medication recently started on Antonio Cobb is reminded of the importance of commitment to daily physical activity for 30 minutes or more, as able and the need to limit carbohydrate intake to 30 to 60 grams per meal to help with blood sugar control.   The need to take medication as prescribed, test blood sugar as directed, and to call between visits if there is a concern that blood sugar is uncontrolled is also discussed.   Antonio Cobb is reminded of the importance of daily foot exam, annual eye examination, and good blood sugar, blood pressure and cholesterol control.  Diabetic Labs Latest Ref Rng & Units 04/06/2019 04/05/2019 01/24/2019 08/09/2018 02/22/2018  HbA1c <5.7 % of total Hgb - - 11.1(H) 10.7(H) 9.3(H)  Microalbumin mg/dL - - - 0.3 -  Micro/Creat Ratio 0.0 - 30.0 mg/g creat - - - - -  Chol <200 mg/dL - - 154 233(H) 211(H)  HDL > OR = 40 mg/dL - - 46 30(L) 36(L)  Calc LDL mg/dL (calc) - - 76 - -  Triglycerides <150 mg/dL - 465(H) 227(H) 1,064(H) 526(H)  Creatinine 0.61 - 1.24 mg/dL 1.30(H) 1.38(H) 1.22 1.25 1.17   BP/Weight 04/11/2019 04/06/2019 03/22/2019 11/16/2018 07/06/2018 05/26/2018 03/22/8118  Systolic BP 147 829 562 130 865 784 696  Diastolic BP 70 86  70 80 82 82 80  Wt. (Lbs) 204 206.13 215 216.04 203 211 215  BMI 30.13 30.44 31.75 31.9 29.98 29.43 29.99   Foot/eye exam completion dates Latest Ref Rng & Units 02/21/2018 10/28/2016  Eye Exam No Retinopathy - -  Foot Form Completion - Done Done         Acute on chronic combined  systolic and diastolic CHF (congestive heart failure) (HCC) Resolved but requiring re hospitalization from 4/22 to 04/06/2019 Trigger is dietary indiscretion   Follow Up Instructions:    I discussed the assessment and treatment plan with the patient. The patient was provided an opportunity to ask questions and all were answered. The patient agreed with the plan and demonstrated an understanding of the instructions.   The patient was advised to call back or seek an in-person evaluation if the symptoms worsen or if the condition fails to improve as anticipated.  I provided  25 minutes of non-face-to-face time during this encounter.   Tula Nakayama, MD

## 2019-04-12 ENCOUNTER — Ambulatory Visit: Payer: BLUE CROSS/BLUE SHIELD | Admitting: Family Medicine

## 2019-04-15 ENCOUNTER — Encounter: Payer: Self-pay | Admitting: Family Medicine

## 2019-04-15 DIAGNOSIS — Z09 Encounter for follow-up examination after completed treatment for conditions other than malignant neoplasm: Secondary | ICD-10-CM | POA: Insufficient documentation

## 2019-04-15 NOTE — Assessment & Plan Note (Signed)
Resolved but requiring re hospitalization from 4/22 to 04/06/2019 Trigger is dietary indiscretion

## 2019-04-15 NOTE — Assessment & Plan Note (Signed)
Reminded of extreme importance in following diet and treatment plan , and keeping f/u appt with Endo, states blood sugar has improved with new medication recently started on Mr. Holtsclaw is reminded of the importance of commitment to daily physical activity for 30 minutes or more, as able and the need to limit carbohydrate intake to 30 to 60 grams per meal to help with blood sugar control.   The need to take medication as prescribed, test blood sugar as directed, and to call between visits if there is a concern that blood sugar is uncontrolled is also discussed.   Mr. Gover is reminded of the importance of daily foot exam, annual eye examination, and good blood sugar, blood pressure and cholesterol control.  Diabetic Labs Latest Ref Rng & Units 04/06/2019 04/05/2019 01/24/2019 08/09/2018 02/22/2018  HbA1c <5.7 % of total Hgb - - 11.1(H) 10.7(H) 9.3(H)  Microalbumin mg/dL - - - 0.3 -  Micro/Creat Ratio 0.0 - 30.0 mg/g creat - - - - -  Chol <200 mg/dL - - 154 233(H) 211(H)  HDL > OR = 40 mg/dL - - 46 30(L) 36(L)  Calc LDL mg/dL (calc) - - 76 - -  Triglycerides <150 mg/dL - 465(H) 227(H) 1,064(H) 526(H)  Creatinine 0.61 - 1.24 mg/dL 1.30(H) 1.38(H) 1.22 1.25 1.17   BP/Weight 04/11/2019 04/06/2019 03/22/2019 11/16/2018 07/06/2018 05/26/2018 0/12/7492  Systolic BP 496 759 163 846 659 935 701  Diastolic BP 70 86 70 80 82 82 80  Wt. (Lbs) 204 206.13 215 216.04 203 211 215  BMI 30.13 30.44 31.75 31.9 29.98 29.43 29.99   Foot/eye exam completion dates Latest Ref Rng & Units 02/21/2018 10/28/2016  Eye Exam No Retinopathy - -  Foot Form Completion - Done Done

## 2019-04-15 NOTE — Assessment & Plan Note (Signed)
Patient in for follow up of recent hospitalization. Discharge summary, and laboratory and radiology data are reviewed, and any questions or concerns about recent hospitalization are discussed. Specific issues requiring follow up are specifically addressed.  

## 2019-04-25 ENCOUNTER — Telehealth: Payer: Self-pay | Admitting: Internal Medicine

## 2019-04-25 NOTE — Telephone Encounter (Signed)
New message    [facility] for pt to call back and schedule virtual visit with Dr. Lovena Le. Will offer pt doxemity video visit if pt has a smart phone. If not, phone visit with provider will be offered.

## 2019-05-01 ENCOUNTER — Other Ambulatory Visit: Payer: Self-pay

## 2019-05-01 ENCOUNTER — Ambulatory Visit (INDEPENDENT_AMBULATORY_CARE_PROVIDER_SITE_OTHER): Payer: BLUE CROSS/BLUE SHIELD | Admitting: *Deleted

## 2019-05-01 DIAGNOSIS — I428 Other cardiomyopathies: Secondary | ICD-10-CM | POA: Diagnosis not present

## 2019-05-02 LAB — CUP PACEART REMOTE DEVICE CHECK
Date Time Interrogation Session: 20200519090356
Implantable Lead Implant Date: 20190219
Implantable Lead Location: 753860
Implantable Lead Model: 293
Implantable Lead Serial Number: 438538
Implantable Pulse Generator Implant Date: 20190219
Pulse Gen Serial Number: 248173

## 2019-05-09 ENCOUNTER — Encounter: Payer: Self-pay | Admitting: Cardiology

## 2019-05-09 NOTE — Progress Notes (Signed)
Remote ICD transmission.   

## 2019-05-10 ENCOUNTER — Other Ambulatory Visit: Payer: Self-pay | Admitting: Student

## 2019-05-11 NOTE — Telephone Encounter (Signed)
This is a [place] pt.  °

## 2019-06-09 ENCOUNTER — Other Ambulatory Visit: Payer: Self-pay | Admitting: Student

## 2019-06-27 IMAGING — CR DG CHEST 2V
2 series · 2 of 2 positions shown · non-contrast
Comparison: 09/06/2017

CLINICAL DATA: Status post defibrillator placement

EXAM:
CHEST  2 VIEW

[chest pa]
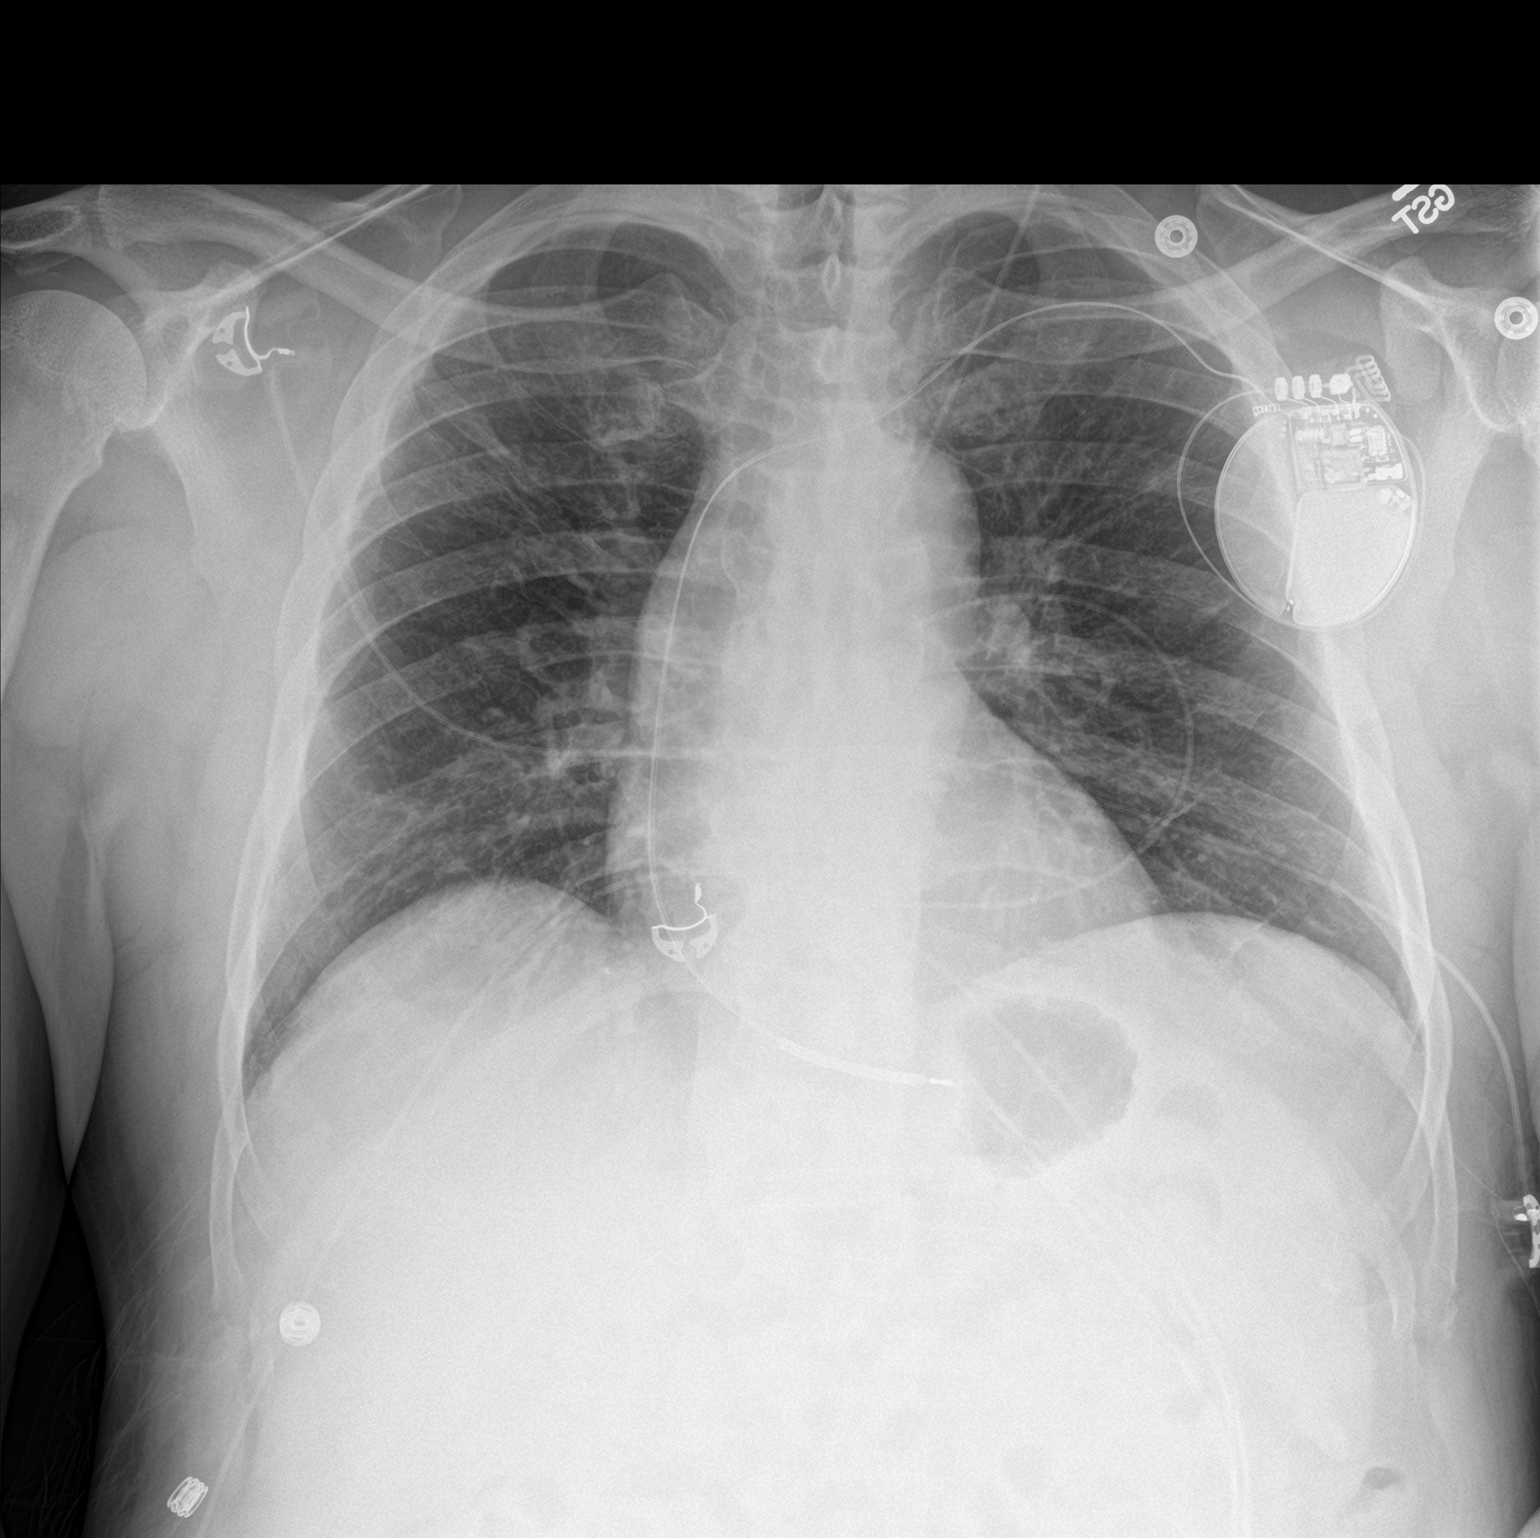

[chest lat]
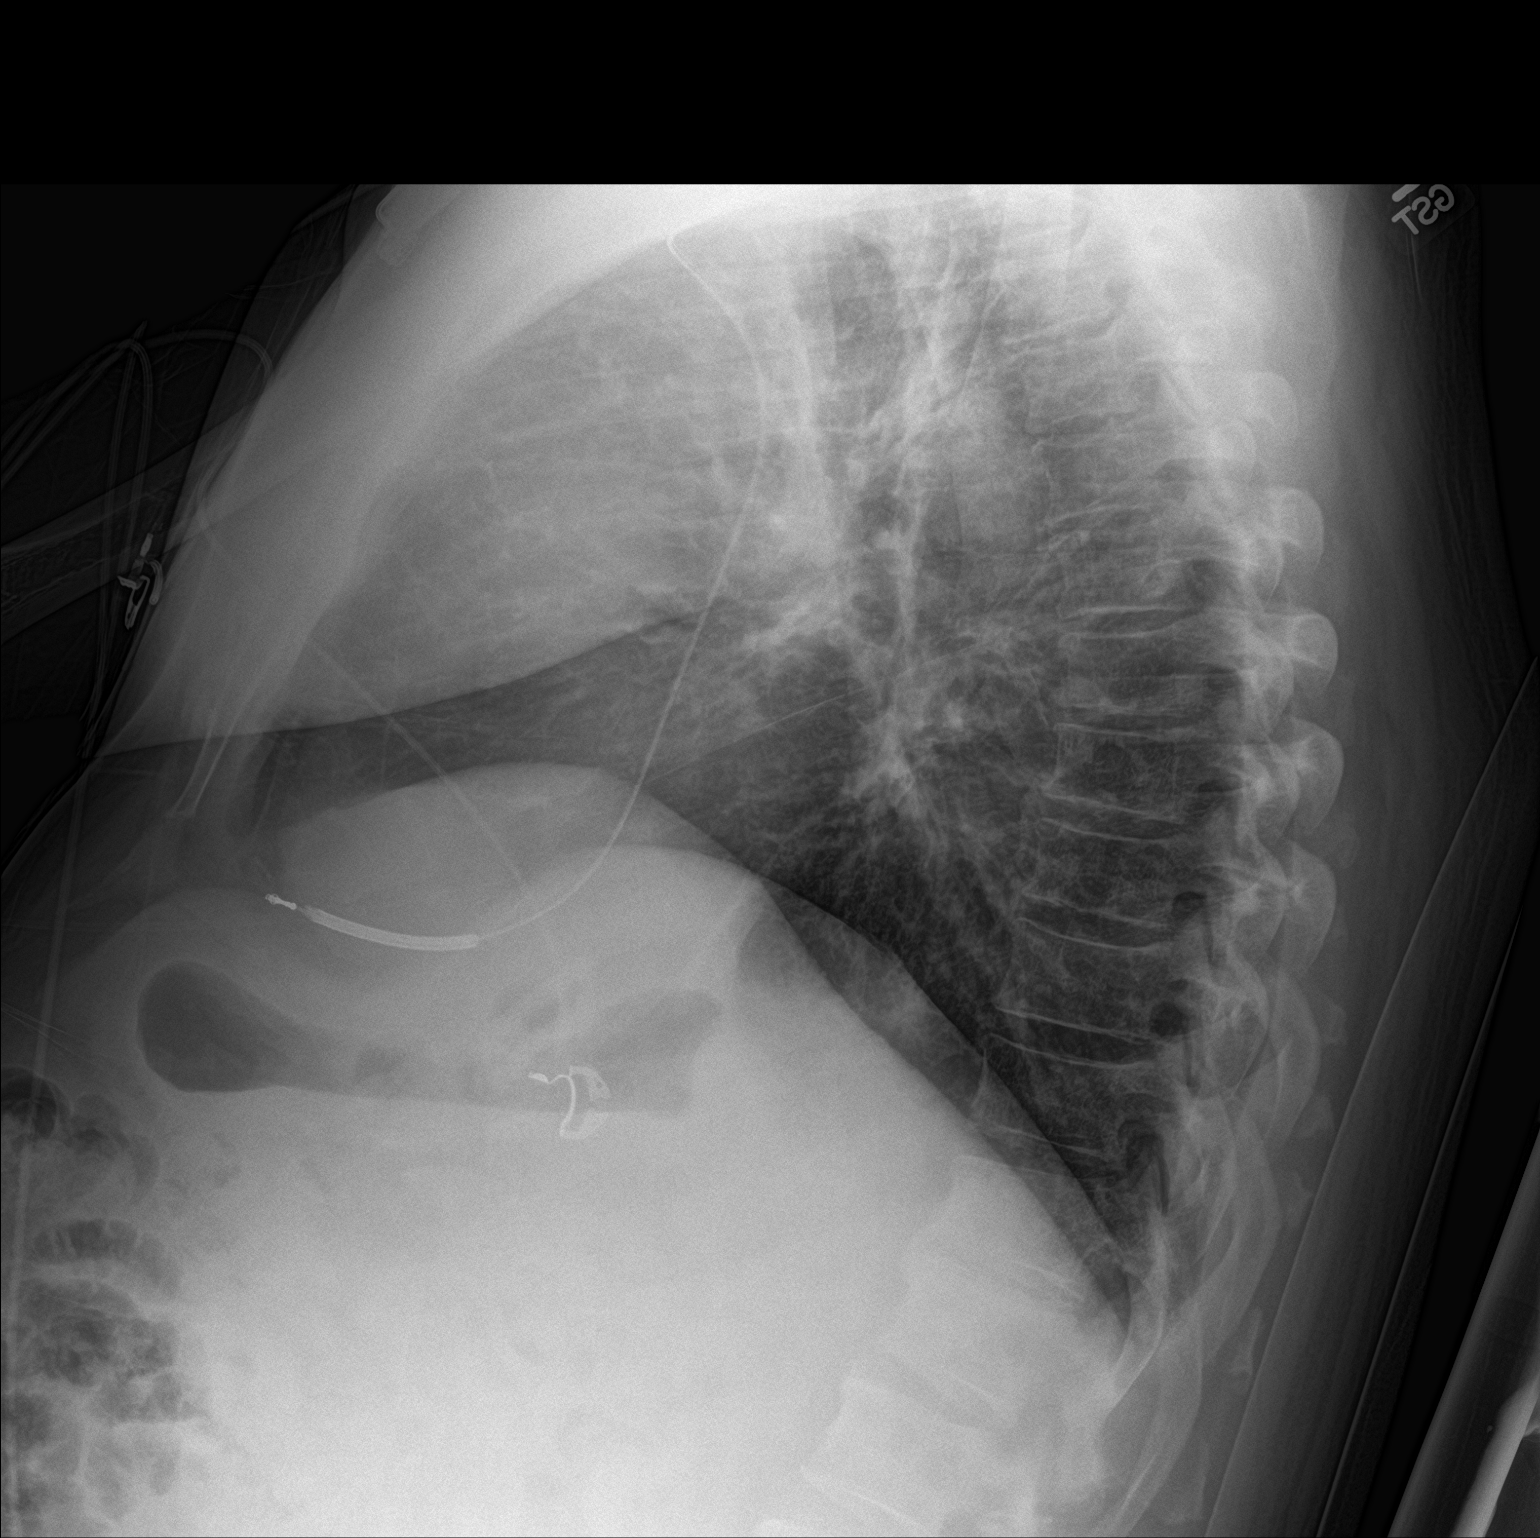

[2 of 2 positions shown; findings below may reference images not displayed]

FINDINGS: Cardiac shadow is stable. Defibrillator is now seen in satisfactory
position. No pneumothorax is noted infiltrate or sizable effusion is
seen. No bony abnormality is noted.
IMPRESSION: No pneumothorax following pacemaker placement

## 2019-06-30 ENCOUNTER — Ambulatory Visit: Payer: Self-pay | Admitting: "Endocrinology

## 2019-07-19 ENCOUNTER — Ambulatory Visit: Payer: BLUE CROSS/BLUE SHIELD | Admitting: Urology

## 2019-07-21 ENCOUNTER — Telehealth: Payer: Self-pay

## 2019-07-21 ENCOUNTER — Other Ambulatory Visit: Payer: Self-pay | Admitting: Internal Medicine

## 2019-07-21 DIAGNOSIS — Z20822 Contact with and (suspected) exposure to covid-19: Secondary | ICD-10-CM

## 2019-07-21 DIAGNOSIS — R6889 Other general symptoms and signs: Secondary | ICD-10-CM | POA: Diagnosis not present

## 2019-07-21 NOTE — Telephone Encounter (Signed)
FYI  Patients spouse called and stated patient called her from home and said he couldn't breath well and just didn't feel well.I advised her I would call the patient and see how he was feeling.  I spoke with the patient and at the time I spoke with him he stated he was feeling better, he was feeling great. All of the symptoms were gone. He had even made breakfast. I advised him that if the symptoms returned he needed to see evaluation at the closed UC or ED with verbal understanding. He thanked me for calling to check on him.

## 2019-07-21 NOTE — Telephone Encounter (Signed)
Noted, thanks!

## 2019-07-22 LAB — NOVEL CORONAVIRUS, NAA: SARS-CoV-2, NAA: NOT DETECTED

## 2019-07-26 ENCOUNTER — Telehealth: Payer: Self-pay | Admitting: Family Medicine

## 2019-07-26 ENCOUNTER — Telehealth: Payer: Self-pay | Admitting: General Practice

## 2019-07-26 NOTE — Telephone Encounter (Signed)
Pt calling to get test results

## 2019-07-26 NOTE — Telephone Encounter (Signed)
Pt made aware of negative results, expressed understanding

## 2019-07-28 NOTE — Telephone Encounter (Signed)
Unable to send COVID results per fax that was provided.  Call placed to pt's wife.  Discussed pt. activating MyChart; sent link to pt's phone to activate MyChart.  Advised pt. Can access test results via this tool.  Wife verb. understanding.

## 2019-07-28 NOTE — Telephone Encounter (Signed)
Per pt. Request, faxed copy of COVID Test Results to [facility].  Faxed results to [employer], Attention [place] @ [phone].

## 2019-08-01 ENCOUNTER — Encounter: Payer: BLUE CROSS/BLUE SHIELD | Admitting: *Deleted

## 2019-08-01 NOTE — Telephone Encounter (Signed)
Patient already aware of covid results

## 2019-08-02 ENCOUNTER — Telehealth: Payer: Self-pay

## 2019-08-02 NOTE — Telephone Encounter (Signed)
Left message for patient to remind of missed remote transmission.  

## 2019-08-10 ENCOUNTER — Encounter: Payer: BLUE CROSS/BLUE SHIELD | Admitting: Family Medicine

## 2019-08-10 ENCOUNTER — Ambulatory Visit (INDEPENDENT_AMBULATORY_CARE_PROVIDER_SITE_OTHER): Payer: BC Managed Care – PPO | Admitting: *Deleted

## 2019-08-10 DIAGNOSIS — I428 Other cardiomyopathies: Secondary | ICD-10-CM

## 2019-08-10 DIAGNOSIS — I5022 Chronic systolic (congestive) heart failure: Secondary | ICD-10-CM

## 2019-08-11 LAB — CUP PACEART REMOTE DEVICE CHECK
Date Time Interrogation Session: 20200828105705
Implantable Lead Implant Date: 20190219
Implantable Lead Location: 753860
Implantable Lead Model: 293
Implantable Lead Serial Number: 438538
Implantable Pulse Generator Implant Date: 20190219
Pulse Gen Serial Number: 248173

## 2019-08-13 ENCOUNTER — Other Ambulatory Visit: Payer: Self-pay | Admitting: "Endocrinology

## 2019-08-16 NOTE — Progress Notes (Signed)
Remote ICD transmission.   

## 2019-08-30 ENCOUNTER — Ambulatory Visit (INDEPENDENT_AMBULATORY_CARE_PROVIDER_SITE_OTHER): Payer: BC Managed Care – PPO | Admitting: Urology

## 2019-08-30 DIAGNOSIS — N5201 Erectile dysfunction due to arterial insufficiency: Secondary | ICD-10-CM

## 2019-09-26 ENCOUNTER — Encounter: Payer: BC Managed Care – PPO | Admitting: Family Medicine

## 2019-10-05 ENCOUNTER — Other Ambulatory Visit: Payer: Self-pay

## 2019-10-05 MED ORDER — FREESTYLE LITE TEST VI STRP: ORAL_STRIP | 3 refills | Status: DC

## 2019-10-10 ENCOUNTER — Telehealth: Payer: Self-pay

## 2019-10-10 ENCOUNTER — Other Ambulatory Visit: Payer: Self-pay

## 2019-10-10 MED ORDER — AMLODIPINE BESYLATE 10 MG PO TABS: 10.0000 mg | ORAL_TABLET | Freq: Every day | ORAL | 3 refills | Status: DC

## 2019-10-10 NOTE — Telephone Encounter (Signed)
Refilled amlodipine 10 mg qd °

## 2019-10-11 ENCOUNTER — Encounter: Payer: Self-pay | Admitting: Family Medicine

## 2019-10-11 ENCOUNTER — Ambulatory Visit (INDEPENDENT_AMBULATORY_CARE_PROVIDER_SITE_OTHER): Payer: BC Managed Care – PPO | Admitting: Family Medicine

## 2019-10-11 ENCOUNTER — Other Ambulatory Visit: Payer: Self-pay

## 2019-10-11 VITALS — BP 118/78 | HR 90 | Temp 98.8°F | Resp 15 | Ht 70.0 in | Wt 208.0 lb

## 2019-10-11 DIAGNOSIS — I5022 Chronic systolic (congestive) heart failure: Secondary | ICD-10-CM | POA: Diagnosis not present

## 2019-10-11 DIAGNOSIS — E663 Overweight: Secondary | ICD-10-CM | POA: Diagnosis not present

## 2019-10-11 DIAGNOSIS — Z0001 Encounter for general adult medical examination with abnormal findings: Secondary | ICD-10-CM

## 2019-10-11 DIAGNOSIS — E782 Mixed hyperlipidemia: Secondary | ICD-10-CM

## 2019-10-11 DIAGNOSIS — E1065 Type 1 diabetes mellitus with hyperglycemia: Secondary | ICD-10-CM | POA: Diagnosis not present

## 2019-10-11 DIAGNOSIS — F1010 Alcohol abuse, uncomplicated: Secondary | ICD-10-CM

## 2019-10-11 DIAGNOSIS — Z23 Encounter for immunization: Secondary | ICD-10-CM | POA: Diagnosis not present

## 2019-10-11 NOTE — Progress Notes (Signed)
Antonio Cobb is a 58 y.o. male who presents for annual wellness visit and follow-up on chronic medical conditions.  He has the following concerns: none  Immunization History  Administered Date(s) Administered  . Influenza Split 11/11/2011, 09/07/2012  . Influenza,inj,Quad PF,6+ Mos 03/12/2016, 10/08/2016, 09/14/2017, 01/30/2019, 01/30/2019  . Pneumococcal Polysaccharide-23 08/13/2011, 10/08/2016  . Td 07/01/2004  . Tdap 07/05/2014   Last colonoscopy: Due 2022 Last PSA: Needs checking  Dentist: 2020 Ophtho: 09/2019 Exercise: Does not currently do anything  End of Life Discussion:  Patient does not have a living will and medical power of attorney  ROS:  The patient denies anorexia, fever, weight changes, headaches,  vision loss, decreased hearing, ear pain, hoarseness, chest pain, palpitations, dizziness, syncope, dyspnea on exertion, cough, swelling, nausea, vomiting, diarrhea, constipation, abdominal pain, melena, hematochezia, indigestion/heartburn, hematuria, incontinence, erectile dysfunction, nocturia, weakened urine stream, dysuria, genital lesions, joint pains, numbness, tingling, weakness, tremor, suspicious skin lesions, depression, anxiety, abnormal bleeding/bruising, or enlarged lymph nodes  Past Medical History:  Diagnosis Date  . Alcohol abuse   . CAD (coronary artery disease)    a. s/p inferolateral STEMI in 05/2012 with DES to LCx b. patent stent by cath in 05/2016 with moderate RCA and LAD disease  . Cardiomyopathy ([place])    a. LVEF 30-35% by echo in 09/2016. b. 08/2017: echo showing EF remains reduced at 25-30% c. s/p [employer] ICD placement in 01/2018  . COPD (chronic obstructive pulmonary disease) ([place])   . Diabetes mellitus, type 2 ([place])   . Essential hypertension   . Mitral regurgitation    Moderate  . Mixed hyperlipidemia   . Myocardial infarction ([place]) 05/23/2012   Inferolateral STEMI  . Obesity     Current Meds  Medication Sig  .  albuterol (VENTOLIN HFA) 108 (90 Base) MCG/ACT inhaler Inhale 2 puffs into the lungs every 6 (six) hours as needed for wheezing or shortness of breath.  [name] amLODipine (NORVASC) 10 MG tablet Take 1 tablet (10 mg total) by mouth daily.  [name] aspirin (ASPIRIN ADULT LOW STRENGTH) 81 MG EC tablet Take 81 mg by mouth daily.    . budesonide-formoterol (SYMBICORT) 80-4.5 MCG/ACT inhaler Inhale 2 puffs into the lungs 2 (two) times daily.  . furosemide (LASIX) 40 MG tablet TAKE 1 TABLET(40 MG) BY MOUTH TWICE DAILY  . glucose blood (FREESTYLE LITE) test strip TEST FOUR TIMES DAILY  . insulin regular (NOVOLIN R,HUMULIN R) 100 units/mL injection Inject 0.1 mLs (10 Units total) into the skin 3 (three) times daily before meals.  [name] ipratropium (ATROVENT) 0.02 % nebulizer solution Take 2.5 mLs (0.5 mg total) by nebulization 4 (four) times daily.  [name] ipratropium-albuterol (DUONEB) 0.5-2.5 (3) MG/3ML SOLN Take 3 mLs by nebulization every 6 (six) hours as needed.  [name] LANTUS SOLOSTAR 100 UNIT/ML Solostar Pen ADMINISTER 50 UNITS UNDER THE SKIN EVERY NIGHT AT BEDTIME  . losartan (COZAAR) 25 MG tablet TAKE 1/2 TABLET(12.5 MG) BY MOUTH DAILY  . metoprolol succinate (TOPROL-XL) 50 MG 24 hr tablet TAKE 1 TABLET BY MOUTH EVERY DAY WITH FOOD  . mometasone-formoterol (DULERA) 100-5 MCG/ACT AERO Inhale 2 puffs into the lungs 2 (two) times daily.  . nitroGLYCERIN (NITROSTAT) 0.4 MG SL tablet Place 1 tablet (0.4 mg total) under the tongue every 5 (five) minutes as needed for chest pain.  . potassium chloride SA (K-DUR) 20 MEQ tablet Take 1 tablet (20 mEq total) by mouth daily.  . rosuvastatin (CRESTOR) 40 MG tablet Take 1 tablet by mouth daily.  [name]  spironolactone (ALDACTONE) 25 MG tablet TAKE 1 TABLET(25 MG) BY MOUTH DAILY  . tiotropium (SPIRIVA HANDIHALER) 18 MCG inhalation capsule inhale the contents of one capsule in the handihaler once daily  . umeclidinium bromide (INCRUSE ELLIPTA) 62.5 MCG/INH AEPB Inhale 1 puff into the lungs  daily.    PHYSICAL EXAM:  BP 118/78   Pulse 90   Temp 98.8 F (37.1 C) (Oral)   Resp 15   Ht 5\' 10"  (1.778 m)   Wt 208 lb (94.3 kg)   SpO2 96%   BMI 29.84 kg/m   Physical Exam Vitals signs and nursing note reviewed.  Constitutional:      General: He is awake.     Appearance: Normal appearance. He is well-developed, well-groomed and overweight.  HENT:     Head: Normocephalic and atraumatic.     Right Ear: Hearing, tympanic membrane, ear canal and external ear normal.     Left Ear: Hearing, tympanic membrane, ear canal and external ear normal.     Ears:     Weber exam findings: does not lateralize.    Right Rinne: AC > BC.    Left Rinne: AC > BC.    Nose: Rhinorrhea present.     Mouth/Throat:     Lips: Pink.     Mouth: Mucous membranes are moist.     Dentition: Abnormal dentition.     Tongue: No lesions. Tongue does not deviate from midline.     Palate: No mass and lesions.     Pharynx: Oropharynx is clear. Uvula midline.  Eyes:     General: Lids are normal.        Right eye: No discharge.        Left eye: No discharge.     Extraocular Movements: Extraocular movements intact.     Conjunctiva/sclera: Conjunctivae normal.     Pupils: Pupils are equal, round, and reactive to light.  Neck:     Musculoskeletal: Normal range of motion and neck supple.     Thyroid: No thyroid mass, thyromegaly or thyroid tenderness.  Cardiovascular:     Rate and Rhythm: Normal rate and regular rhythm.     Pulses: Normal pulses.     Heart sounds: Normal heart sounds.  Pulmonary:     Effort: Pulmonary effort is normal.     Breath sounds: Normal breath sounds.  Abdominal:     General: Bowel sounds are normal. There is distension.     Palpations: Abdomen is soft. There is no shifting dullness, fluid wave, hepatomegaly, splenomegaly, mass or pulsatile mass.     Tenderness: There is no abdominal tenderness. There is no right CVA tenderness or left CVA tenderness.     Hernia: No hernia is  present.  Musculoskeletal: Normal range of motion.     Right lower leg: No edema.     Left lower leg: No edema.  Lymphadenopathy:     Cervical: No cervical adenopathy.  Skin:    General: Skin is warm and dry.     Capillary Refill: Capillary refill takes less than 2 seconds.  Neurological:     General: No focal deficit present.     Mental Status: He is alert and oriented to person, place, and time. Mental status is at baseline.     Cranial Nerves: Cranial nerves are intact.     Sensory: Sensation is intact.     Motor: Motor function is intact.     Coordination: Coordination is intact.     Gait: Gait is  intact.     Deep Tendon Reflexes: Reflexes are normal and symmetric.  Psychiatric:        Attention and Perception: Attention and perception normal.        Mood and Affect: Mood and affect normal.        Speech: Speech normal.        Behavior: Behavior normal. Behavior is cooperative.        Thought Content: Thought content normal.        Cognition and Memory: Cognition and memory normal.        Judgment: Judgment normal.     ASSESSMENT/PLAN:  1. Annual visit for general adult medical examination with abnormal findings Discussed PSA screening (risks/benefits), recommended at least 30 minutes of aerobic activity at least 5 days/week; proper sunscreen use reviewed; healthy diet and alcohol recommendations (less than or equal to 2 drinks/day) reviewed; regular seatbelt use; changing batteries in smoke detectors. Immunization recommendations discussed.  Colonoscopy recommendations reviewed.  - COMPLETE METABOLIC PANEL WITH GFR - Hemoglobin A1c - Lipid panel - CBC - PSA  2. Chronic systolic (congestive) heart failure (HCC) Stable, sees cardiologist, reports taking medications as directed. Will check labs. No leg swelling or SHOB complains or in PE today. Continue medications as ordered.  - COMPLETE METABOLIC PANEL WITH GFR - CBC  3. Overweight (BMI 25.0-29.9) Mayra A  Vongphachanh is re-educated about the importance of exercise daily to help with weight management. A minumum of 30 minutes daily is recommended. Additionally, importance of healthy food choices  with portion control discussed.   Wt Readings from Last 3 Encounters:  10/11/19 208 lb (94.3 kg)  04/11/19 204 lb (92.5 kg)  04/06/19 206 lb 2.1 oz (93.5 kg)     4. Uncontrolled type 1 diabetes mellitus with hyperglycemia ([place]) Hughie A Papin is encouraged to check blood sugar daily as directed. Continue current medications. Is on statin as well- but not taking right Educated on importance of maintain a well balanced diabetic friendly diet.  He is reminded the importance of maintaining  good blood sugars,  taking medications as directed, daily foot care, annual eye exams. Additionally educated about keeping good control over blood pressure and cholesterol as well.  - Hemoglobin A1c - Lipid panel  5. Alcohol abuse Drinks 1 alcohol liquor drink and 1 beer a night he says.  He reports he says this more often than not.  Advised for him to reduce the amount of alcohol that he drinks as this also is not good for his diabetes.  - COMPLETE METABOLIC PANEL WITH GFR - CBC  6. Mixed hyperlipidemia Encouraged him to maintain a heart healthy low-fat diet.  Reports he is not taking his medication as he needs. He reports that the statin causes him to have leg pain.  Advised for him after we get his lipid panel fasting that we might need to adjust his medications or look at possibly trying a different form of medication to help with his cholesterol control secondary to the fact that he has had a heart attack in the past.  - Lipid panel  7. Need for immunization against influenza Patient was educated on the recommendation for flu vaccine. After obtaining informed consent, the vaccine was administered no adverse effects noted at time of administration. Patient provided with education on arm soreness and use  of tylenol or ibuprofen (if safe) for this. Encourage to use the arm vaccine was given in to help reduce the soreness. Patient educated on the signs  of a reaction to the vaccine and advised to contact the office should these occur.     - Flu Vaccine QUAD 36+ mos IM   Follow Up: 3 months    Perlie Mayo, NP   10/11/2019

## 2019-10-11 NOTE — Patient Instructions (Addendum)
  I appreciate the opportunity to provide you with the care for your health and wellness. Today we discussed: overall health   Follow up: 3 months    Labs ordered, get on next day off (FASTING)  Please work on diet and lifestyle changes to help get Diabetes and Cholesterol levels under control. Walking 30-60 minutes daily is a great way to lose weight, improve heart health and overall wellness.  Watch what you eat, this includes beer and other forms of alcohol, as these have high amounts of carbs that break down into sugars.  Make sure you are drinking water daily, and avoid the sodas.  HEALTH MAINTENANCE RECOMMENDATIONS:  It is recommended that you get at least 30 minutes of aerobic exercise at least 5 days/week (for weight loss, you may need as much as 60-90 minutes). This can be any activity that gets your heart rate up. This can be divided in 10-15 minute intervals if needed, but try and build up your endurance at least once a week.  Weight bearing exercise is also recommended twice weekly.  Eat a healthy diet with lots of vegetables, fruits and fiber.  "Colorful" foods have a lot of vitamins (ie green vegetables, tomatoes, red peppers, etc).  Limit sweet tea, regular sodas and alcoholic beverages, all of which has a lot of calories and sugar.  Up to 2 alcoholic drinks daily may be beneficial for men (unless trying to lose weight, watch sugars).  Drink a lot of water.  Sunscreen of at least SPF 30 should be used on all sun-exposed parts of the skin when outside between the hours of 10 am and 4 pm (not just when at beach or pool, but even with exercise, golf, tennis, and yard work!)  Use a sunscreen that says "broad spectrum" so it covers both UVA and UVB rays, and make sure to reapply every 1-2 hours.  Remember to change the batteries in your smoke detectors when changing your clock times in the spring and fall.  Use your seat belt every time you are in a car, and please drive safely and  not be distracted with cell phones and texting while driving.  Please continue to practice social distancing to keep you, your family, and our community safe.  If you must go out, please wear a mask and practice good handwashing.  It was a pleasure to see you and I look forward to continuing to work together on your health and well-being. Please do not hesitate to call the office if you need care or have questions about your care.  Have a wonderful day and week. With Gratitude, Cherly Beach, DNP, AGNP-BC

## 2019-10-23 DIAGNOSIS — I5022 Chronic systolic (congestive) heart failure: Secondary | ICD-10-CM | POA: Diagnosis not present

## 2019-10-23 DIAGNOSIS — E782 Mixed hyperlipidemia: Secondary | ICD-10-CM | POA: Diagnosis not present

## 2019-10-23 DIAGNOSIS — Z0001 Encounter for general adult medical examination with abnormal findings: Secondary | ICD-10-CM | POA: Diagnosis not present

## 2019-10-23 DIAGNOSIS — E1065 Type 1 diabetes mellitus with hyperglycemia: Secondary | ICD-10-CM | POA: Diagnosis not present

## 2019-10-24 LAB — COMPLETE METABOLIC PANEL WITH GFR
AG Ratio: 1.5 (calc) (ref 1.0–2.5)
ALT: 23 U/L (ref 9–46)
AST: 21 U/L (ref 10–35)
Albumin: 4.1 g/dL (ref 3.6–5.1)
Alkaline phosphatase (APISO): 78 U/L (ref 35–144)
BUN: 18 mg/dL (ref 7–25)
CO2: 29 mmol/L (ref 20–32)
Calcium: 9.1 mg/dL (ref 8.6–10.3)
Chloride: 101 mmol/L (ref 98–110)
Creat: 1.14 mg/dL (ref 0.70–1.33)
GFR, Est African American: 81 mL/min/{1.73_m2} (ref >=60)
GFR, Est Non African American: 70 mL/min/{1.73_m2} (ref >=60)
Globulin: 2.8 g/dL (calc) (ref 1.9–3.7)
Glucose, Bld: 229 mg/dL — ABNORMAL HIGH (ref 65–99)
Potassium: 4.3 mmol/L (ref 3.5–5.3)
Sodium: 139 mmol/L (ref 135–146)
Total Bilirubin: 0.8 mg/dL (ref 0.2–1.2)
Total Protein: 6.9 g/dL (ref 6.1–8.1)

## 2019-10-24 LAB — CBC
HCT: 40.6 % (ref 38.5–50.0)
Hemoglobin: 13.6 g/dL (ref 13.2–17.1)
MCH: 28.8 pg (ref 27.0–33.0)
MCHC: 33.5 g/dL (ref 32.0–36.0)
MCV: 86 fL (ref 80.0–100.0)
MPV: 11.6 fL (ref 7.5–12.5)
Platelets: 235 10*3/uL (ref 140–400)
RBC: 4.72 10*6/uL (ref 4.20–5.80)
RDW: 12.9 % (ref 11.0–15.0)
WBC: 7.8 10*3/uL (ref 3.8–10.8)

## 2019-10-24 LAB — LIPID PANEL
Cholesterol: 161 mg/dL (ref <200)
HDL: 41 mg/dL (ref >=40)
LDL Cholesterol (Calc): 82 mg/dL (calc)
Non-HDL Cholesterol (Calc): 120 mg/dL (calc) (ref <130)
Total CHOL/HDL Ratio: 3.9 (calc) (ref <5.0)
Triglycerides: 289 mg/dL — ABNORMAL HIGH (ref <150)

## 2019-10-24 LAB — HEMOGLOBIN A1C
Hgb A1c MFr Bld: 10.2 % of total Hgb — ABNORMAL HIGH (ref <5.7)
Mean Plasma Glucose: 246 (calc)
eAG (mmol/L): 13.6 (calc)

## 2019-10-24 LAB — PSA: PSA: 0.6 ng/mL (ref <=4.0)

## 2019-10-25 NOTE — Progress Notes (Signed)
Fasting blood sugar is elevated, along with A1c at 10.2% though this improved from previous level of 11.1% Needs adjust to medications, please ask if willing.  Liver and kidney are good. But continued DM uncontrolled will cause problems with kidneys. Triglycerides are elevated, needs to be on fish oil to start. 1 g twice daily. Continue Crestor.  Blood levels are good. Needs heavy focus on DM control and diet. Please review with him.

## 2019-11-13 ENCOUNTER — Inpatient Hospital Stay (HOSPITAL_COMMUNITY): Payer: BC Managed Care – PPO

## 2019-11-13 ENCOUNTER — Encounter (HOSPITAL_COMMUNITY): Payer: Self-pay | Admitting: *Deleted

## 2019-11-13 ENCOUNTER — Ambulatory Visit (INDEPENDENT_AMBULATORY_CARE_PROVIDER_SITE_OTHER): Payer: BC Managed Care – PPO | Admitting: *Deleted

## 2019-11-13 ENCOUNTER — Inpatient Hospital Stay (HOSPITAL_COMMUNITY)
Admission: EM | Admit: 2019-11-13 | Discharge: 2019-11-14 | DRG: 291 | Disposition: A | Discharge status: Home / Self Care | Payer: BC Managed Care – PPO | Attending: Internal Medicine | Admitting: Internal Medicine

## 2019-11-13 ENCOUNTER — Other Ambulatory Visit: Payer: Self-pay

## 2019-11-13 ENCOUNTER — Emergency Department (HOSPITAL_COMMUNITY): Payer: BC Managed Care – PPO

## 2019-11-13 DIAGNOSIS — J9602 Acute respiratory failure with hypercapnia: Secondary | ICD-10-CM

## 2019-11-13 DIAGNOSIS — E1165 Type 2 diabetes mellitus with hyperglycemia: Secondary | ICD-10-CM | POA: Diagnosis not present

## 2019-11-13 DIAGNOSIS — I130 Hypertensive heart and chronic kidney disease with heart failure and stage 1 through stage 4 chronic kidney disease, or unspecified chronic kidney disease: Principal | ICD-10-CM | POA: Diagnosis present

## 2019-11-13 DIAGNOSIS — Z8249 Family history of ischemic heart disease and other diseases of the circulatory system: Secondary | ICD-10-CM

## 2019-11-13 DIAGNOSIS — Z794 Long term (current) use of insulin: Secondary | ICD-10-CM

## 2019-11-13 DIAGNOSIS — I110 Hypertensive heart disease with heart failure: Secondary | ICD-10-CM | POA: Diagnosis not present

## 2019-11-13 DIAGNOSIS — Z955 Presence of coronary angioplasty implant and graft: Secondary | ICD-10-CM | POA: Diagnosis not present

## 2019-11-13 DIAGNOSIS — E1122 Type 2 diabetes mellitus with diabetic chronic kidney disease: Secondary | ICD-10-CM | POA: Diagnosis not present

## 2019-11-13 DIAGNOSIS — Z87891 Personal history of nicotine dependence: Secondary | ICD-10-CM | POA: Diagnosis not present

## 2019-11-13 DIAGNOSIS — E119 Type 2 diabetes mellitus without complications: Secondary | ICD-10-CM

## 2019-11-13 DIAGNOSIS — F1010 Alcohol abuse, uncomplicated: Secondary | ICD-10-CM | POA: Diagnosis not present

## 2019-11-13 DIAGNOSIS — J810 Acute pulmonary edema: Secondary | ICD-10-CM | POA: Diagnosis not present

## 2019-11-13 DIAGNOSIS — R069 Unspecified abnormalities of breathing: Secondary | ICD-10-CM | POA: Diagnosis not present

## 2019-11-13 DIAGNOSIS — J9601 Acute respiratory failure with hypoxia: Secondary | ICD-10-CM | POA: Diagnosis not present

## 2019-11-13 DIAGNOSIS — Z833 Family history of diabetes mellitus: Secondary | ICD-10-CM

## 2019-11-13 DIAGNOSIS — I340 Nonrheumatic mitral (valve) insufficiency: Secondary | ICD-10-CM | POA: Diagnosis not present

## 2019-11-13 DIAGNOSIS — Z823 Family history of stroke: Secondary | ICD-10-CM

## 2019-11-13 DIAGNOSIS — Z825 Family history of asthma and other chronic lower respiratory diseases: Secondary | ICD-10-CM | POA: Diagnosis not present

## 2019-11-13 DIAGNOSIS — I5043 Acute on chronic combined systolic (congestive) and diastolic (congestive) heart failure: Secondary | ICD-10-CM | POA: Diagnosis not present

## 2019-11-13 DIAGNOSIS — I252 Old myocardial infarction: Secondary | ICD-10-CM

## 2019-11-13 DIAGNOSIS — Z95810 Presence of automatic (implantable) cardiac defibrillator: Secondary | ICD-10-CM

## 2019-11-13 DIAGNOSIS — J80 Acute respiratory distress syndrome: Secondary | ICD-10-CM | POA: Diagnosis not present

## 2019-11-13 DIAGNOSIS — J449 Chronic obstructive pulmonary disease, unspecified: Secondary | ICD-10-CM | POA: Diagnosis not present

## 2019-11-13 DIAGNOSIS — E782 Mixed hyperlipidemia: Secondary | ICD-10-CM | POA: Diagnosis not present

## 2019-11-13 DIAGNOSIS — R0602 Shortness of breath: Secondary | ICD-10-CM | POA: Diagnosis not present

## 2019-11-13 DIAGNOSIS — Z79899 Other long term (current) drug therapy: Secondary | ICD-10-CM

## 2019-11-13 DIAGNOSIS — Z7951 Long term (current) use of inhaled steroids: Secondary | ICD-10-CM

## 2019-11-13 DIAGNOSIS — R9431 Abnormal electrocardiogram [ECG] [EKG]: Secondary | ICD-10-CM | POA: Diagnosis present

## 2019-11-13 DIAGNOSIS — Z91199 Patient's noncompliance with other medical treatment and regimen due to unspecified reason: Secondary | ICD-10-CM

## 2019-11-13 DIAGNOSIS — N1831 Chronic kidney disease, stage 3a: Secondary | ICD-10-CM | POA: Diagnosis present

## 2019-11-13 DIAGNOSIS — Z9119 Patient's noncompliance with other medical treatment and regimen: Secondary | ICD-10-CM | POA: Diagnosis not present

## 2019-11-13 DIAGNOSIS — R0902 Hypoxemia: Secondary | ICD-10-CM | POA: Diagnosis not present

## 2019-11-13 DIAGNOSIS — Z7982 Long term (current) use of aspirin: Secondary | ICD-10-CM

## 2019-11-13 DIAGNOSIS — N183 Chronic kidney disease, stage 3 unspecified: Secondary | ICD-10-CM | POA: Diagnosis not present

## 2019-11-13 DIAGNOSIS — I5022 Chronic systolic (congestive) heart failure: Secondary | ICD-10-CM | POA: Diagnosis not present

## 2019-11-13 DIAGNOSIS — Z20828 Contact with and (suspected) exposure to other viral communicable diseases: Secondary | ICD-10-CM | POA: Diagnosis not present

## 2019-11-13 DIAGNOSIS — I5023 Acute on chronic systolic (congestive) heart failure: Secondary | ICD-10-CM | POA: Diagnosis not present

## 2019-11-13 DIAGNOSIS — I491 Atrial premature depolarization: Secondary | ICD-10-CM | POA: Diagnosis not present

## 2019-11-13 DIAGNOSIS — I2510 Atherosclerotic heart disease of native coronary artery without angina pectoris: Secondary | ICD-10-CM | POA: Diagnosis not present

## 2019-11-13 HISTORY — DX: Heart failure, unspecified: I50.9

## 2019-11-13 LAB — BASIC METABOLIC PANEL
Anion gap: 12 (ref 5–15)
BUN: 22 mg/dL — ABNORMAL HIGH (ref 6–20)
CO2: 26 mmol/L (ref 22–32)
Calcium: 8.9 mg/dL (ref 8.9–10.3)
Chloride: 97 mmol/L — ABNORMAL LOW (ref 98–111)
Creatinine, Ser: 1.45 mg/dL — ABNORMAL HIGH (ref 0.61–1.24)
GFR calc Af Amer: >60 mL/min (ref >60)
GFR calc non Af Amer: 52 mL/min — ABNORMAL LOW (ref >60)
Glucose, Bld: 386 mg/dL — ABNORMAL HIGH (ref 70–99)
Potassium: 3 mmol/L — ABNORMAL LOW (ref 3.5–5.1)
Sodium: 135 mmol/L (ref 135–145)

## 2019-11-13 LAB — CUP PACEART REMOTE DEVICE CHECK
Date Time Interrogation Session: 20201130071438
Implantable Lead Implant Date: 20190219
Implantable Lead Location: 753860
Implantable Lead Model: 293
Implantable Lead Serial Number: 438538
Implantable Pulse Generator Implant Date: 20190219
Pulse Gen Serial Number: 248173

## 2019-11-13 LAB — MAGNESIUM: Magnesium: 2.3 mg/dL (ref 1.7–2.4)

## 2019-11-13 LAB — ECHOCARDIOGRAM COMPLETE
Height: 70 in
Weight: 3287.5 oz

## 2019-11-13 LAB — CBC WITH DIFFERENTIAL/PLATELET
Abs Immature Granulocytes: 0.04 10*3/uL (ref 0.00–0.07)
Basophils Absolute: 0.1 10*3/uL (ref 0.0–0.1)
Basophils Relative: 1 %
Eosinophils Absolute: 0.7 10*3/uL — ABNORMAL HIGH (ref 0.0–0.5)
Eosinophils Relative: 7 %
HCT: 46.5 % (ref 39.0–52.0)
Hemoglobin: 14.8 g/dL (ref 13.0–17.0)
Immature Granulocytes: 0 %
Lymphocytes Relative: 22 %
Lymphs Abs: 2.2 10*3/uL (ref 0.7–4.0)
MCH: 29.1 pg (ref 26.0–34.0)
MCHC: 31.8 g/dL (ref 30.0–36.0)
MCV: 91.4 fL (ref 80.0–100.0)
Monocytes Absolute: 0.5 10*3/uL (ref 0.1–1.0)
Monocytes Relative: 5 %
Neutro Abs: 6.1 10*3/uL (ref 1.7–7.7)
Neutrophils Relative %: 65 %
Platelets: 213 10*3/uL (ref 150–400)
RBC: 5.09 MIL/uL (ref 4.22–5.81)
RDW: 12.5 % (ref 11.5–15.5)
WBC: 9.6 10*3/uL (ref 4.0–10.5)
nRBC: 0 % (ref 0.0–0.2)

## 2019-11-13 LAB — MRSA PCR SCREENING: MRSA by PCR: NEGATIVE

## 2019-11-13 LAB — TROPONIN I (HIGH SENSITIVITY)
Troponin I (High Sensitivity): 22 ng/L — ABNORMAL HIGH (ref <18)
Troponin I (High Sensitivity): 67 ng/L — ABNORMAL HIGH (ref <18)

## 2019-11-13 LAB — BRAIN NATRIURETIC PEPTIDE: B Natriuretic Peptide: 252 pg/mL — ABNORMAL HIGH (ref 0.0–100.0)

## 2019-11-13 LAB — GLUCOSE, CAPILLARY
Glucose-Capillary: 221 mg/dL — ABNORMAL HIGH (ref 70–99)
Glucose-Capillary: 169 mg/dL — ABNORMAL HIGH (ref 70–99)
Glucose-Capillary: 268 mg/dL — ABNORMAL HIGH (ref 70–99)
Glucose-Capillary: 239 mg/dL — ABNORMAL HIGH (ref 70–99)

## 2019-11-13 LAB — SARS CORONAVIRUS 2 BY RT PCR (HOSPITAL ORDER, PERFORMED IN ~~LOC~~ HOSPITAL LAB): SARS Coronavirus 2: NEGATIVE

## 2019-11-13 LAB — TSH: TSH: 1.333 u[IU]/mL (ref 0.350–4.500)

## 2019-11-13 LAB — CBG MONITORING, ED: Glucose-Capillary: 306 mg/dL — ABNORMAL HIGH (ref 70–99)

## 2019-11-13 LAB — LACTIC ACID, PLASMA: Lactic Acid, Venous: 2.8 mmol/L (ref 0.5–1.9)

## 2019-11-13 MED ORDER — LORAZEPAM 2 MG/ML IJ SOLN: 0.0000 mg | Freq: Two times a day (BID) | INTRAMUSCULAR | Status: DC

## 2019-11-13 MED ORDER — FUROSEMIDE 10 MG/ML IJ SOLN
60.0000 mg | Freq: Two times a day (BID) | INTRAMUSCULAR | Status: DC
  Administered 2019-11-13 – 2019-11-14 (×3): 60 mg via INTRAVENOUS
  Filled 2019-11-13 (×3): qty 6

## 2019-11-13 MED ORDER — INSULIN ASPART 100 UNIT/ML ~~LOC~~ SOLN
0.0000 [IU] | Freq: Three times a day (TID) | SUBCUTANEOUS | Status: DC
  Administered 2019-11-14 (×2): 5 [IU] via SUBCUTANEOUS

## 2019-11-13 MED ORDER — ROSUVASTATIN CALCIUM 20 MG PO TABS
40.0000 mg | ORAL_TABLET | Freq: Every day | ORAL | Status: DC
  Administered 2019-11-13 – 2019-11-14 (×2): 40 mg via ORAL
  Filled 2019-11-13 (×2): qty 2

## 2019-11-13 MED ORDER — LORAZEPAM 2 MG/ML IJ SOLN: 0.0000 mg | Freq: Four times a day (QID) | INTRAMUSCULAR | Status: DC

## 2019-11-13 MED ORDER — LORAZEPAM 1 MG PO TABS: 0.0000 mg | ORAL_TABLET | Freq: Two times a day (BID) | ORAL | Status: DC

## 2019-11-13 MED ORDER — CHLORHEXIDINE GLUCONATE 0.12 % MT SOLN
15.0000 mL | Freq: Two times a day (BID) | OROMUCOSAL | Status: DC
  Administered 2019-11-13 – 2019-11-14 (×2): 15 mL via OROMUCOSAL
  Filled 2019-11-13: qty 15

## 2019-11-13 MED ORDER — ACETAMINOPHEN 325 MG PO TABS: 650.0000 mg | ORAL_TABLET | ORAL | Status: DC | PRN

## 2019-11-13 MED ORDER — ORAL CARE MOUTH RINSE
15.0000 mL | Freq: Two times a day (BID) | OROMUCOSAL | Status: DC
  Administered 2019-11-13 (×2): 15 mL via OROMUCOSAL

## 2019-11-13 MED ORDER — SODIUM CHLORIDE 0.9% FLUSH: 3.0000 mL | INTRAVENOUS | Status: DC | PRN

## 2019-11-13 MED ORDER — POLYETHYLENE GLYCOL 3350 17 G PO PACK: 17.0000 g | PACK | Freq: Every day | ORAL | Status: DC | PRN

## 2019-11-13 MED ORDER — ASPIRIN EC 81 MG PO TBEC
81.0000 mg | DELAYED_RELEASE_TABLET | Freq: Every day | ORAL | Status: DC
  Administered 2019-11-13 – 2019-11-14 (×2): 81 mg via ORAL
  Filled 2019-11-13 (×2): qty 1

## 2019-11-13 MED ORDER — VITAMIN B-1 100 MG PO TABS
100.0000 mg | ORAL_TABLET | Freq: Every day | ORAL | Status: DC
  Administered 2019-11-13 – 2019-11-14 (×2): 100 mg via ORAL
  Filled 2019-11-13 (×2): qty 1

## 2019-11-13 MED ORDER — INSULIN GLARGINE 100 UNIT/ML ~~LOC~~ SOLN
45.0000 [IU] | Freq: Every day | SUBCUTANEOUS | Status: DC
  Administered 2019-11-13 – 2019-11-14 (×2): 45 [IU] via SUBCUTANEOUS
  Filled 2019-11-13 (×3): qty 0.45

## 2019-11-13 MED ORDER — THIAMINE HCL 100 MG/ML IJ SOLN: 100.0000 mg | Freq: Every day | INTRAMUSCULAR | Status: DC

## 2019-11-13 MED ORDER — INSULIN ASPART 100 UNIT/ML ~~LOC~~ SOLN: 0.0000 [IU] | Freq: Every day | SUBCUTANEOUS | Status: DC

## 2019-11-13 MED ORDER — SENNOSIDES-DOCUSATE SODIUM 8.6-50 MG PO TABS
1.0000 | ORAL_TABLET | Freq: Every day | ORAL | Status: DC
  Administered 2019-11-13: 1 via ORAL
  Filled 2019-11-13: qty 1

## 2019-11-13 MED ORDER — INSULIN ASPART 100 UNIT/ML ~~LOC~~ SOLN
0.0000 [IU] | Freq: Every day | SUBCUTANEOUS | Status: DC
  Administered 2019-11-13: 3 [IU] via SUBCUTANEOUS

## 2019-11-13 MED ORDER — SODIUM CHLORIDE 0.9% FLUSH
3.0000 mL | Freq: Two times a day (BID) | INTRAVENOUS | Status: DC
  Administered 2019-11-13 – 2019-11-14 (×3): 3 mL via INTRAVENOUS

## 2019-11-13 MED ORDER — IPRATROPIUM-ALBUTEROL 0.5-2.5 (3) MG/3ML IN SOLN: 3.0000 mL | Freq: Four times a day (QID) | RESPIRATORY_TRACT | Status: DC | PRN

## 2019-11-13 MED ORDER — INSULIN ASPART 100 UNIT/ML ~~LOC~~ SOLN: 0.0000 [IU] | Freq: Three times a day (TID) | SUBCUTANEOUS | Status: DC

## 2019-11-13 MED ORDER — FUROSEMIDE 10 MG/ML IJ SOLN
80.0000 mg | Freq: Once | INTRAMUSCULAR | Status: AC
  Administered 2019-11-13: 80 mg via INTRAVENOUS
  Filled 2019-11-13: qty 8

## 2019-11-13 MED ORDER — CHLORHEXIDINE GLUCONATE CLOTH 2 % EX PADS
6.0000 | MEDICATED_PAD | Freq: Every day | CUTANEOUS | Status: DC
  Administered 2019-11-13 – 2019-11-14 (×2): 6 via TOPICAL

## 2019-11-13 MED ORDER — SODIUM CHLORIDE 0.9 % IV SOLN: 250.0000 mL | INTRAVENOUS | Status: DC | PRN

## 2019-11-13 MED ORDER — INSULIN ASPART 100 UNIT/ML ~~LOC~~ SOLN
0.0000 [IU] | SUBCUTANEOUS | Status: DC
  Administered 2019-11-13: 11 [IU] via SUBCUTANEOUS
  Administered 2019-11-13: 5 [IU] via SUBCUTANEOUS
  Administered 2019-11-13: 8 [IU] via SUBCUTANEOUS
  Administered 2019-11-13: 3 [IU] via SUBCUTANEOUS
  Filled 2019-11-13: qty 1

## 2019-11-13 MED ORDER — METOPROLOL SUCCINATE ER 25 MG PO TB24
25.0000 mg | ORAL_TABLET | Freq: Every day | ORAL | Status: DC
  Administered 2019-11-13 – 2019-11-14 (×2): 25 mg via ORAL
  Filled 2019-11-13 (×2): qty 1

## 2019-11-13 MED ORDER — POTASSIUM CHLORIDE CRYS ER 20 MEQ PO TBCR
20.0000 meq | EXTENDED_RELEASE_TABLET | Freq: Every day | ORAL | Status: DC
  Administered 2019-11-13 – 2019-11-14 (×2): 20 meq via ORAL
  Filled 2019-11-13 (×2): qty 1

## 2019-11-13 MED ORDER — MOMETASONE FURO-FORMOTEROL FUM 100-5 MCG/ACT IN AERO
2.0000 | INHALATION_SPRAY | Freq: Two times a day (BID) | RESPIRATORY_TRACT | Status: DC
  Administered 2019-11-13 – 2019-11-14 (×2): 2 via RESPIRATORY_TRACT
  Filled 2019-11-13: qty 8.8

## 2019-11-13 MED ORDER — UMECLIDINIUM BROMIDE 62.5 MCG/INH IN AEPB
1.0000 | INHALATION_SPRAY | Freq: Every day | RESPIRATORY_TRACT | Status: DC
  Administered 2019-11-14: 1 via RESPIRATORY_TRACT
  Filled 2019-11-13: qty 7

## 2019-11-13 MED ORDER — HEPARIN SODIUM (PORCINE) 5000 UNIT/ML IJ SOLN
5000.0000 [IU] | Freq: Three times a day (TID) | INTRAMUSCULAR | Status: DC
  Administered 2019-11-13 (×3): 5000 [IU] via SUBCUTANEOUS
  Filled 2019-11-13 (×3): qty 1

## 2019-11-13 MED ORDER — LORAZEPAM 1 MG PO TABS: 0.0000 mg | ORAL_TABLET | Freq: Four times a day (QID) | ORAL | Status: DC

## 2019-11-13 MED ORDER — LIVING BETTER WITH HEART FAILURE BOOK
Freq: Once | Status: AC
  Administered 2019-11-13: 09:00:00

## 2019-11-13 NOTE — ED Triage Notes (Signed)
Pt arrived to er by ems with c/o resp distress, upon ems arrival to pt, pt was unable to speak, hypertensive, wife reported to ems that sob started prior to calling ems, pt was placed on bipap, given 2 sublingual nitro with improvement of symptoms, pt arrived to er, accessory muscle usage noted, Dr Betsey Holiday at bedside,

## 2019-11-13 NOTE — Progress Notes (Signed)
Inpatient Diabetes Program Recommendations  AACE/ADA: New Consensus Statement on Inpatient Glycemic Control  Target Ranges:  Prepandial:   less than 140 mg/dL      Peak postprandial:   less than 180 mg/dL (1-2 hours)      Critically ill patients:  140 - 180 mg/dL   Results for CHADI, MIDYETTE (MRN [id number]) as of 11/13/2019 08:23  Ref. Range 11/13/2019 06:01  Glucose-Capillary Latest Ref Range: 70 - 99 mg/dL 306 (H)  Results for CORRIE, TOMINAGA (MRN [id number]) as of 11/13/2019 08:23  Ref. Range 11/13/2019 04:56  Glucose Latest Ref Range: 70 - 99 mg/dL 386 (H)  Results for ABHAY, PIASECKI (MRN [id number]) as of 11/13/2019 08:23  Ref. Range 01/24/2019 08:41 10/23/2019 09:55  Hemoglobin A1C Latest Ref Range: <5.7 % of total Hgb 11.1 (H) 10.2 (H)   Review of Glycemic Control  Diabetes history: DM2 Outpatient Diabetes medications: Lantus 50 units QHS, Regular 10 units TID with meals Current orders for Inpatient glycemic control: Novolog 0-15 units Q4H  Inpatient Diabetes Program Recommendations:   Insulin - Basal: Please consider ordering Lantus 20 units Q24H starting now.  Thanks, Barnie Alderman, RN, MSN, CDE Diabetes Coordinator Inpatient Diabetes Program [phone] (Team Pager from 8am to 5pm)

## 2019-11-13 NOTE — ED Notes (Addendum)
Date and time results received: 11/13/19 0525  Test: lactic Critical Value: 2.8  Name of Provider Notified: Betsey Holiday, MD  Orders Received? Or Actions Taken?: acknowledged

## 2019-11-13 NOTE — Progress Notes (Signed)
Pt transported from ED to ICU on BIPAP.  Pt tolerating well at this time.  RT will continue to monitor.

## 2019-11-13 NOTE — Progress Notes (Signed)
Dr. Josephine Cables paged to see if blood sugar/accu checks could be changed to ac&hs. Right now they are q4h with bedtime coverage. Waiting for orders/call back. Will continue to monitor pt.

## 2019-11-13 NOTE — H&P (Signed)
History and Physical    Antonio Cobb [id number] DOB: June 02, 1961 DOA: 11/13/2019  Referring MD/NP/PA: Dr. Betsey Holiday. PCP: [name], MD  Patient coming from: Home  Chief Complaint: Increased shortness of breath.  HPI: Antonio Cobb is a 58 y.o. male with past medical history significant for alcohol abuse, coronary artery disease status post STEMI with DES to left circumflex in 0000000, systolic heart failure with ejection fraction 25 to 30% and is status post ICD, COPD, type 2 diabetes mellitus poorly controlled and insulin-dependent, essential hypertension, hyperlipidemia, chronic kidney disease a stage IIIa; who presented to the hospital secondary to increased shortness of breath.  Patient reports being compliant with his medications and doing better about sodium control.  He was in his usual state of health the night prior to admission when he woke up to go to the bathroom and experienced significant shortness of breath.  He denies any chest pain or palpitations, no nausea, no vomiting, no headaches, no focal weakness, no dysuria, no hematuria, no melena, no hematochezia no abdominal pain or any other complaints.   In the ED work-up demonstrated elevated BNP, hyperglycemia with blood sugar in the 380 range, chest x-ray with bilateral vascular congestion and interstitial edema.  Patient oxygen saturation dropped to the point of requiring to be placed on BiPAP.  IV Lasix 80 mg were provided by ED doctor and [facility] was contacted to admit patient for further evaluation and management.  Troponin was negative.  Past Medical/Surgical History: Past Medical History:  Diagnosis Date  . Alcohol abuse   . CAD (coronary artery disease)    a. s/p inferolateral STEMI in 05/2012 with DES to LCx b. patent stent by cath in 05/2016 with moderate RCA and LAD disease  . Cardiomyopathy ([place])    a. LVEF 30-35% by echo in 09/2016. b. 08/2017: echo showing EF remains reduced at 25-30% c. s/p [place]  [employer] ICD placement in 01/2018  . CHF (congestive heart failure) ([place])   . COPD (chronic obstructive pulmonary disease) ([place])   . Diabetes mellitus, type 2 ([place])   . Essential hypertension   . Mitral regurgitation    Moderate  . Mixed hyperlipidemia   . Myocardial infarction ([place]) 05/23/2012   Inferolateral STEMI  . Obesity     Past Surgical History:  Procedure Laterality Date  . CARDIAC CATHETERIZATION  2 yrs ago  . CARDIAC CATHETERIZATION N/A 05/15/2016   Procedure: Right/Left Heart Cath and Coronary Angiography;  Surgeon: [name], MD;  Location: [facility];  Service: Cardiovascular;  Laterality: N/A;  . CORONARY STENT PLACEMENT  05/23/12  . ICD IMPLANT N/A 02/01/2018   Procedure: ICD IMPLANT;  Surgeon: Evans Lance, MD;  Location: [facility];  Service: Cardiovascular;  Laterality: N/A;  . LEFT HEART CATHETERIZATION WITH CORONARY ANGIOGRAM N/A 05/23/2012   Procedure: LEFT HEART CATHETERIZATION WITH CORONARY ANGIOGRAM;  Surgeon: [name], MD;  Location: [facility] CATH LAB;  Service: Cardiovascular;  Laterality: N/A;  . None    . PERCUTANEOUS CORONARY STENT INTERVENTION (PCI-S) N/A 05/23/2012   Procedure: PERCUTANEOUS CORONARY STENT INTERVENTION (PCI-S);  Surgeon: [name], MD;  Location: [facility] CATH LAB;  Service: Cardiovascular;  Laterality: N/A;  . POLYPECTOMY  10/16/2011   Procedure: POLYPECTOMY;  Surgeon: [name], MD;  Location: [facility];  Service: Endoscopy;;  Polypoid Lesion, Transverse and Sigmoid Colon    Social History:  reports that he quit smoking about 3 years ago. His smoking use included cigars. He has a  7.50 pack-year smoking history. He has never used smokeless tobacco. He reports current alcohol use. He reports that he does not use drugs.  Allergies: Allergies  Allergen Reactions  . Other     Seasonal allergies  - has to use inhaler    Family History:  Family History  Problem Relation Age of Onset  . Emphysema Mother   .  Heart attack Mother   . Hypertension Mother   . Diabetes Mother   . Heart disease Mother   . COPD Mother   . Emphysema Father   . COPD Father   . Stroke Sister   . Asthma Sister   . Colon cancer Neg Hx   . Anesthesia problems Neg Hx   . Hypotension Neg Hx   . Malignant hyperthermia Neg Hx   . Pseudochol deficiency Neg Hx     Prior to Admission medications   Medication Sig Start Date End Date Taking? Authorizing Provider  albuterol (VENTOLIN HFA) 108 (90 Base) MCG/ACT inhaler Inhale 2 puffs into the lungs every 6 (six) hours as needed for wheezing or shortness of breath.    [provider]  amLODipine (NORVASC) 10 MG tablet Take 1 tablet (10 mg total) by mouth daily. 10/10/19   Arnoldo Lenis, MD  aspirin (ASPIRIN ADULT LOW STRENGTH) 81 MG EC tablet Take 81 mg by mouth daily.      [provider]  budesonide-formoterol (SYMBICORT) 80-4.5 MCG/ACT inhaler Inhale 2 puffs into the lungs 2 (two) times daily.    [provider]  furosemide (LASIX) 40 MG tablet TAKE 1 TABLET(40 MG) BY MOUTH TWICE DAILY 05/11/19   Arnoldo Lenis, MD  glucose blood (FREESTYLE LITE) test strip TEST FOUR TIMES DAILY 10/05/19   [name], MD  insulin regular (NOVOLIN R,HUMULIN R) 100 units/mL injection Inject 0.1 mLs (10 Units total) into the skin 3 (three) times daily before meals. 03/27/19   Cassandria Anger, MD  ipratropium (ATROVENT) 0.02 % nebulizer solution Take 2.5 mLs (0.5 mg total) by nebulization 4 (four) times daily. 11/16/18   [name], MD  ipratropium-albuterol (DUONEB) 0.5-2.5 (3) MG/3ML SOLN Take 3 mLs by nebulization every 6 (six) hours as needed. 04/27/16   [name], MD  LANTUS SOLOSTAR 100 UNIT/ML Solostar Pen ADMINISTER 50 UNITS UNDER THE SKIN EVERY NIGHT AT BEDTIME 08/14/19   Cassandria Anger, MD  losartan (COZAAR) 25 MG tablet TAKE 1/2 TABLET(12.5 MG) BY MOUTH DAILY 03/14/19   [name], MD  metoprolol succinate  (TOPROL-XL) 50 MG 24 hr tablet TAKE 1 TABLET BY MOUTH EVERY DAY WITH FOOD 05/11/19   Arnoldo Lenis, MD  mometasone-formoterol (DULERA) 100-5 MCG/ACT AERO Inhale 2 puffs into the lungs 2 (two) times daily.    [provider]  nitroGLYCERIN (NITROSTAT) 0.4 MG SL tablet Place 1 tablet (0.4 mg total) under the tongue every 5 (five) minutes as needed for chest pain. 05/29/16 11/06/19  Lendon Colonel, NP  potassium chloride SA (K-DUR) 20 MEQ tablet Take 1 tablet (20 mEq total) by mouth daily. 04/11/19   [name], MD  rosuvastatin (CRESTOR) 40 MG tablet Take 1 tablet by mouth daily. 11/11/18   [provider]  spironolactone (ALDACTONE) 25 MG tablet TAKE 1 TABLET(25 MG) BY MOUTH DAILY 06/09/19   Ahmed Prima, [place] M, PA-C  tiotropium (SPIRIVA HANDIHALER) 18 MCG inhalation capsule inhale the contents of one capsule in the handihaler once daily 05/12/17   [name], MD  umeclidinium bromide (  INCRUSE ELLIPTA) 62.5 MCG/INH AEPB Inhale 1 puff into the lungs daily.    [provider]    Review of Systems:  Negative except as otherwise mentioned in HPI.   Physical Exam: Vitals:   11/13/19 0530 11/13/19 0700 11/13/19 0822 11/13/19 0825  BP: (!) 152/106 (!) 133/97  (!) 145/93  Pulse: 96 95    Resp: (!) 21     Temp:    (!) 97.2 F (36.2 C)  TempSrc:    Axillary  SpO2: 100% 100%    Weight:   93.2 kg   Height:   5\' 10"  (1.778 m)     Constitutional: Wearing BiPAP, expressing feeling short of breath and denying chest pain.  No fever, no nausea or vomiting.   Eyes: PERRL, lids and conjunctivae normal, no icterus, no nystagmus. ENMT: Mucous membranes are moist. Posterior pharynx clear of any exudate or lesions. Neck: normal, supple, no masses, no thyromegaly Respiratory: Positive fine crackles at the bases, no wheezing, fair air movement bilaterally.  Positive tachypnea, no using accessory muscles at this time.   Cardiovascular: Positive systolic ejection  murmur, regular rate and rhythm; no rubs, no gallops, mild JVD appreciated at the bases.  No carotid bruits. Abdomen: no tenderness, no masses palpated. No hepatosplenomegaly. Bowel sounds positive.  Musculoskeletal: no clubbing / cyanosis. No joint deformity upper and lower extremities. Good ROM, no contractures. Normal muscle tone.  Trace edema appreciated bilaterally. Skin: no rashes, no petechiae, no open wounds. Neurologic: CN 2-12 grossly intact. Sensation intact, DTR normal. Strength 5/5 in all 4.  Psychiatric: Normal judgment and insight. Alert and oriented x 3. Normal mood.    Labs on Admission: I have personally reviewed the following labs and imaging studies  CBC: Recent Labs  Lab 11/13/19 0456  WBC 9.6  NEUTROABS 6.1  HGB 14.8  HCT 46.5  MCV 91.4  PLT 123456   Basic Metabolic Panel: Recent Labs  Lab 11/13/19 0456  NA 135  K 3.0*  CL 97*  CO2 26  GLUCOSE 386*  BUN 22*  CREATININE 1.45*  CALCIUM 8.9   GFR: Estimated Creatinine Clearance: 62.9 mL/min (A) (by C-G formula based on SCr of 1.45 mg/dL (H)).  CBG: Recent Labs  Lab 11/13/19 0601 11/13/19 0834  GLUCAP 306* 221*   Urine analysis:    Component Value Date/Time   COLORURINE COLORLESS (A) 09/06/2017 0452   APPEARANCEUR CLEAR 09/06/2017 0452   LABSPEC 1.006 09/06/2017 0452   PHURINE 5.0 09/06/2017 0452   GLUCOSEU >=500 (A) 09/06/2017 0452   GLUCOSEU NEG mg/dL 03/04/2007 0829   HGBUR NEGATIVE 09/06/2017 0452   HGBUR negative 11/13/2010 [place] 09/06/2017 0452   [place] 09/06/2017 0452   PROTEINUR NEGATIVE 09/06/2017 0452   UROBILINOGEN 0.2 03/23/2015 1805   NITRITE NEGATIVE 09/06/2017 0452   LEUKOCYTESUR NEGATIVE 09/06/2017 0452    Recent Results (from the past 240 hour(s))  SARS Coronavirus 2 by RT PCR (hospital order, performed in [facility] hospital lab) Nasopharyngeal Nasopharyngeal Swab     Status: None   Collection Time: 11/13/19  5:21 AM   Specimen:  Nasopharyngeal Swab  Result Value Ref Range Status   SARS Coronavirus 2 NEGATIVE NEGATIVE Final    Comment: (NOTE) SARS-CoV-2 target nucleic acids are NOT DETECTED. The SARS-CoV-2 RNA is generally detectable in upper and lower respiratory specimens during the acute phase of infection. The lowest concentration of SARS-CoV-2 viral copies this assay can detect is 250 copies / mL. A negative result does  not preclude SARS-CoV-2 infection and should not be used as the sole basis for treatment or other patient management decisions.  A negative result may occur with improper specimen collection / handling, submission of specimen other than nasopharyngeal swab, presence of viral mutation(s) within the areas targeted by this assay, and inadequate number of viral copies (<250 copies / mL). A negative result must be combined with clinical observations, patient history, and epidemiological information. Fact Sheet for Patients:   StrictlyIdeas.no Fact Sheet for Healthcare Providers: BankingDealers.co.za This test is not yet approved or cleared  by the [place] FDA and has been authorized for detection and/or diagnosis of SARS-CoV-2 by FDA under an Emergency Use Authorization (EUA).  This EUA will remain in effect (meaning this test can be used) for the duration of the COVID-19 declaration under Section 564(b)(1) of the Act, 21 U.S.C. section 360bbb-3(b)(1), unless the authorization is terminated or revoked sooner. Performed at [facility], [address] [place], [place] [address]      Radiological Exams on Admission: Dg Chest Portable 1 View  Result Date: 11/13/2019 CLINICAL DATA:  Shortness of breath. EXAM: PORTABLE CHEST 1 VIEW COMPARISON:  04/05/2019. FINDINGS: Cardiac pacer stable position. Cardiomegaly with pulmonary venous congestion again noted. Bilateral pulmonary infiltrates/edema, right side greater than left, again noted. No  prominent pleural effusion. No pneumothorax. IMPRESSION: 1. Cardiac pacer stable position. Cardiomegaly with pulmonary venous congestion again noted. 2. Bilateral pulmonary infiltrates/edema, right side greater than left, again noted. Pulmonary edema and or bilateral pneumonia could present this fashion. Similar findings noted on prior exam. Electronically Signed   By: [place]   On: 11/13/2019 05:22    EKG: Independently reviewed.  Sinus rhythm, no acute ischemic changes appreciated.  Assessment/Plan 1-Acute respiratory failure with hypoxia (HCC) -In the setting of acute on chronic systolic heart failure and flash pulmonary edema. -Patient required BiPAP on admission and will be placed in a stepdown bed. -Start IV Lasix -Daily weights, low-sodium, strict I's and O's -Will repeat 2D echo -Troponin negative -Continue beta-blocker -Holding ACE inhibitors in the setting of acute CHF exacerbation. -Close monitoring of renal function and electrolytes. -Wean off BiPAP and oxygen supplementation as tolerated, (patient did not use oxygen supplementation at home). -Follow clinical response.  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 -Follow CBGs and adjust hypoglycemic regimen as needed.  3-Mixed hyperlipidemia -Continue statins.  4-Alcohol abuse -Cessation counseling has been provided -Patient reports significant cut down in the amount of alcohol consumption -No active withdrawal symptoms appreciated -Started on CIWA protocol -Continue thiamine and folic acid.  5-Prolonged QT interval -Avoid/minimize medications that can prolong QT.  6-chronic COPD -Currently without any wheezing -Resume home bronchodilators nebulizer/inhaler therapy.   7-chronic kidney disease stage III 3a -Appears to be stable at baseline -Close monitoring to renal function trend while actively providing diuresis -Closely follow electrolytes and replete them as needed.   DVT prophylaxis: heparin  Code Status: Full Code Family Communication: no family at bedside   Disposition Plan: home once respiratory status and HF exacerbation stabilized   Consults called: none  Admission status: inpatient, stepdown, LOS > 2 midnights     Time Spent: 70 minutes.  Barton Dubois MD [facility] Pager [phone]  11/13/2019, 8:37 AM

## 2019-11-13 NOTE — ED Provider Notes (Addendum)
[facility] EMERGENCY DEPARTMENT Provider Note   CSN: [id number] Arrival date & time: 11/13/19  0430     History   Chief Complaint Chief Complaint  Patient presents with  . Respiratory Distress    HPI Antonio [facility] is a 58 y.o. male.     Patient presents to the emergency department for evaluation of shortness of breath.  Patient reports that he got up to go to the bathroom and noticed that he was short of breath.  Symptoms rapidly worsened and he became severely short of breath.  Patient is not experiencing any chest pain.  He is brought to the ER by EMS.  He was placed on CPAP.  Patient was very hypertensive, administered sublingual nitroglycerin for his blood pressure and presumed congestive heart failure exacerbation.     Past Medical History:  Diagnosis Date  . Alcohol abuse   . CAD (coronary artery disease)    a. s/p inferolateral STEMI in 05/2012 with DES to LCx b. patent stent by cath in 05/2016 with moderate RCA and LAD disease  . Cardiomyopathy ([place])    a. LVEF 30-35% by echo in 09/2016. b. 08/2017: echo showing EF remains reduced at 25-30% c. s/p [employer] ICD placement in 01/2018  . CHF (congestive heart failure) ([place])   . COPD (chronic obstructive pulmonary disease) ([place])   . Diabetes mellitus, type 2 ([place])   . Essential hypertension   . Mitral regurgitation    Moderate  . Mixed hyperlipidemia   . Myocardial infarction ([place]) 05/23/2012   Inferolateral STEMI  . Obesity     Patient Active Problem List   Diagnosis Date Noted  . Flash pulmonary edema ([place]) 11/13/2019  . Uncontrolled type 1 diabetes mellitus with hyperglycemia ([place]) 10/11/2019  . [facility] discharge follow-up 04/15/2019  . Pulmonary edema 04/05/2019  . Muscle pain 02/28/2018  . Chronic systolic (congestive) heart failure ([place]) 02/01/2018  . DM type 2 causing vascular disease ([place]) 10/18/2017  . Personal history of noncompliance with medical treatment, presenting hazards to  health 10/18/2017  . Lumbar degenerative disc disease 06/20/2017  . Chronic systolic heart failure ([place]) 11/01/2016  . Acute respiratory failure with hypoxia ([place])   . Prolonged QT interval 10/07/2016  . Coronary artery disease involving coronary bypass graft of native heart with angina pectoris ([place])   . Abnormal echocardiogram   . Acute on chronic combined systolic and diastolic CHF (congestive heart failure) ([place])   . Allergic rhinitis 05/06/2015  . NICM (nonischemic cardiomyopathy) ([place]) 05/23/2012  . Alcohol abuse 05/23/2012  . Arteriosclerotic cardiovascular disease (ASCVD) 05/23/2012  . ED (erectile dysfunction) 05/10/2012  . Overweight 08/12/2010  . Insulin-requiring or dependent type II diabetes mellitus ([place]) 03/08/2008  . Mixed hyperlipidemia 03/08/2008  . Essential hypertension 03/08/2008    Past Surgical History:  Procedure Laterality Date  . CARDIAC CATHETERIZATION  2 yrs ago  . CARDIAC CATHETERIZATION N/A 05/15/2016   Procedure: Right/Left Heart Cath and Coronary Angiography;  Surgeon: [name], MD;  Location: [facility];  Service: Cardiovascular;  Laterality: N/A;  . CORONARY STENT PLACEMENT  05/23/12  . ICD IMPLANT N/A 02/01/2018   Procedure: ICD IMPLANT;  Surgeon: Evans Lance, MD;  Location: [facility];  Service: Cardiovascular;  Laterality: N/A;  . LEFT HEART CATHETERIZATION WITH CORONARY ANGIOGRAM N/A 05/23/2012   Procedure: LEFT HEART CATHETERIZATION WITH CORONARY ANGIOGRAM;  Surgeon: [name], MD;  Location: [facility] CATH LAB;  Service: Cardiovascular;  Laterality: N/A;  . None    .  PERCUTANEOUS CORONARY STENT INTERVENTION (PCI-S) N/A 05/23/2012   Procedure: PERCUTANEOUS CORONARY STENT INTERVENTION (PCI-S);  Surgeon: [name], MD;  Location: [facility]ATH LAB;  Service: Cardiovascular;  Laterality: N/A;  . POLYPECTOMY  10/16/2011   Procedure: POLYPECTOMY;  Surgeon: [name], MD;  Location: [facility];  Service: Endoscopy;;  Polypoid  Lesion, Transverse and Sigmoid Colon        Home Medications    Prior to Admission medications   Medication Sig Start Date End Date Taking? Authorizing Provider  albuterol (VENTOLIN HFA) 108 (90 Base) MCG/ACT inhaler Inhale 2 puffs into the lungs every 6 (six) hours as needed for wheezing or shortness of breath.    [provider]  amLODipine (NORVASC) 10 MG tablet Take 1 tablet (10 mg total) by mouth daily. 10/10/19   Arnoldo Lenis, MD  aspirin (ASPIRIN ADULT LOW STRENGTH) 81 MG EC tablet Take 81 mg by mouth daily.      [provider]  budesonide-formoterol (SYMBICORT) 80-4.5 MCG/ACT inhaler Inhale 2 puffs into the lungs 2 (two) times daily.    [provider]  furosemide (LASIX) 40 MG tablet TAKE 1 TABLET(40 MG) BY MOUTH TWICE DAILY 05/11/19   Arnoldo Lenis, MD  glucose blood (FREESTYLE LITE) test strip TEST FOUR TIMES DAILY 10/05/19   [name], MD  insulin regular (NOVOLIN R,HUMULIN R) 100 units/mL injection Inject 0.1 mLs (10 Units total) into the skin 3 (three) times daily before meals. 03/27/19   Cassandria Anger, MD  ipratropium (ATROVENT) 0.02 % nebulizer solution Take 2.5 mLs (0.5 mg total) by nebulization 4 (four) times daily. 11/16/18   [name], MD  ipratropium-albuterol (DUONEB) 0.5-2.5 (3) MG/3ML SOLN Take 3 mLs by nebulization every 6 (six) hours as needed. 04/27/16   [name], MD  LANTUS SOLOSTAR 100 UNIT/ML Solostar Pen ADMINISTER 50 UNITS UNDER THE SKIN EVERY NIGHT AT BEDTIME 08/14/19   Cassandria Anger, MD  losartan (COZAAR) 25 MG tablet TAKE 1/2 TABLET(12.5 MG) BY MOUTH DAILY 03/14/19   [name], MD  metoprolol succinate (TOPROL-XL) 50 MG 24 hr tablet TAKE 1 TABLET BY MOUTH EVERY DAY WITH FOOD 05/11/19   Arnoldo Lenis, MD  mometasone-formoterol (DULERA) 100-5 MCG/ACT AERO Inhale 2 puffs into the lungs 2 (two) times daily.    [provider]  nitroGLYCERIN (NITROSTAT) 0.4 MG SL  tablet Place 1 tablet (0.4 mg total) under the tongue every 5 (five) minutes as needed for chest pain. 05/29/16 11/06/19  Lendon Colonel, NP  potassium chloride SA (K-DUR) 20 MEQ tablet Take 1 tablet (20 mEq total) by mouth daily. 04/11/19   [name], MD  rosuvastatin (CRESTOR) 40 MG tablet Take 1 tablet by mouth daily. 11/11/18   [provider]  spironolactone (ALDACTONE) 25 MG tablet TAKE 1 TABLET(25 MG) BY MOUTH DAILY 06/09/19   Ahmed Prima, [place] M, PA-C  tiotropium (SPIRIVA HANDIHALER) 18 MCG inhalation capsule inhale the contents of one capsule in the handihaler once daily 05/12/17   [name], MD  umeclidinium bromide (INCRUSE ELLIPTA) 62.5 MCG/INH AEPB Inhale 1 puff into the lungs daily.    [provider]    Family History Family History  Problem Relation Age of Onset  . Emphysema Mother   . Heart attack Mother   . Hypertension Mother   . Diabetes Mother   . Heart disease Mother   . COPD Mother   . Emphysema Father   . COPD Father   .  Stroke Sister   . Asthma Sister   . Colon cancer Neg Hx   . Anesthesia problems Neg Hx   . Hypotension Neg Hx   . Malignant hyperthermia Neg Hx   . Pseudochol deficiency Neg Hx     Social History Social History   Tobacco Use  . Smoking status: Former Smoker    Packs/day: 0.25    Years: 30.00    Pack years: 7.50    Types: Cigars    Quit date: 05/23/2016    Years since quitting: 3.4  . Smokeless tobacco: Never Used  . Tobacco comment: since early 45s  Substance Use Topics  . Alcohol use: Yes    Alcohol/week: 0.0 standard drinks    Comment: occ  . Drug use: No     Allergies   Other   Review of Systems Review of Systems  Constitutional: Negative for fever.  Respiratory: Positive for shortness of breath. Negative for cough.   Cardiovascular: Negative for chest pain.  All other systems reviewed and are negative.    Physical Exam Updated Vital Signs BP (!) 144/100 (BP Location:  Right Arm)   Pulse 80   Temp 98.1 F (36.7 C) (Oral)   Resp (!) 23   Ht 5\' 10"  (1.778 m)   Wt 91.1 kg   SpO2 97%   BMI 28.82 kg/m   Physical Exam Vitals signs and nursing note reviewed.  Constitutional:      General: He is in acute distress.     Appearance: Normal appearance. He is well-developed.  HENT:     Head: Normocephalic and atraumatic.     Right Ear: Hearing normal.     Left Ear: Hearing normal.     Nose: Nose normal.  Eyes:     Conjunctiva/sclera: Conjunctivae normal.     Pupils: Pupils are equal, round, and reactive to light.  Neck:     Musculoskeletal: Normal range of motion and neck supple.  Cardiovascular:     Rate and Rhythm: Regular rhythm. Tachycardia present.     Heart sounds: S1 normal and S2 normal. No murmur. No friction rub. No gallop.   Pulmonary:     Effort: Tachypnea, accessory muscle usage and respiratory distress present.     Breath sounds: Decreased breath sounds and rales present.  Chest:     Chest wall: No tenderness.  Abdominal:     General: Bowel sounds are normal.     Palpations: Abdomen is soft.     Tenderness: There is no abdominal tenderness. There is no guarding or rebound. Negative signs include Murphy's sign and McBurney's sign.     Hernia: No hernia is present.  Musculoskeletal: Normal range of motion.  Skin:    General: Skin is warm and dry.     Findings: No rash.  Neurological:     Mental Status: He is alert and oriented to person, place, and time.     GCS: GCS eye subscore is 4. GCS verbal subscore is 5. GCS motor subscore is 6.     Cranial Nerves: No cranial nerve deficit.     Sensory: No sensory deficit.     Coordination: Coordination normal.  Psychiatric:        Speech: Speech normal.        Behavior: Behavior normal.        Thought Content: Thought content normal.      ED Treatments / Results  Labs (all labs ordered are listed, but only abnormal results are displayed) Labs Reviewed  CBC WITH  DIFFERENTIAL/PLATELET - Abnormal; Notable for the following components:      Result Value   Eosinophils Absolute 0.7 (*)    All other components within normal limits  BASIC METABOLIC PANEL - Abnormal; Notable for the following components:   Potassium 3.0 (*)    Chloride 97 (*)    Glucose, Bld 386 (*)    BUN 22 (*)    Creatinine, Ser 1.45 (*)    GFR calc non Af Amer 52 (*)    All other components within normal limits  BRAIN NATRIURETIC PEPTIDE - Abnormal; Notable for the following components:   B Natriuretic Peptide 252.0 (*)    All other components within normal limits  LACTIC ACID, PLASMA - Abnormal; Notable for the following components:   Lactic Acid, Venous 2.8 (*)    All other components within normal limits  GLUCOSE, CAPILLARY - Abnormal; Notable for the following components:   Glucose-Capillary 221 (*)    All other components within normal limits  GLUCOSE, CAPILLARY - Abnormal; Notable for the following components:   Glucose-Capillary 169 (*)    All other components within normal limits  GLUCOSE, CAPILLARY - Abnormal; Notable for the following components:   Glucose-Capillary 268 (*)    All other components within normal limits  BASIC METABOLIC PANEL - Abnormal; Notable for the following components:   Glucose, Bld 216 (*)    BUN 22 (*)    Creatinine, Ser 1.40 (*)    GFR calc non Af Amer 55 (*)    All other components within normal limits  GLUCOSE, CAPILLARY - Abnormal; Notable for the following components:   Glucose-Capillary 239 (*)    All other components within normal limits  CBG MONITORING, ED - Abnormal; Notable for the following components:   Glucose-Capillary 306 (*)    All other components within normal limits  TROPONIN I (HIGH SENSITIVITY) - Abnormal; Notable for the following components:   Troponin I (High Sensitivity) 22 (*)    All other components within normal limits  TROPONIN I (HIGH SENSITIVITY) - Abnormal; Notable for the following components:    Troponin I (High Sensitivity) 67 (*)    All other components within normal limits  SARS CORONAVIRUS 2 BY RT PCR ([facility] ORDER, [place])  MRSA PCR SCREENING  TSH  MAGNESIUM  POC SARS CORONAVIRUS 2 AG -  ED    EKG EKG Interpretation  Date/Time:  Monday November 13 2019 04:57:13 EST Ventricular Rate:  98 PR Interval:    QRS Duration: 94 QT Interval:  326 QTC Calculation: 417 R Axis:   16 Text Interpretation: Sinus rhythm Abnormal R-wave progression, early transition Borderline repolarization abnormality No significant change since last tracing Confirmed by Orpah Greek [phone]) on 11/13/2019 5:40:35 AM   Radiology Dg Chest Portable 1 View  Result Date: 11/13/2019 CLINICAL DATA:  Shortness of breath. EXAM: PORTABLE CHEST 1 VIEW COMPARISON:  04/05/2019. FINDINGS: Cardiac pacer stable position. Cardiomegaly with pulmonary venous congestion again noted. Bilateral pulmonary infiltrates/edema, right side greater than left, again noted. No prominent pleural effusion. No pneumothorax. IMPRESSION: 1. Cardiac pacer stable position. Cardiomegaly with pulmonary venous congestion again noted. 2. Bilateral pulmonary infiltrates/edema, right side greater than left, again noted. Pulmonary edema and or bilateral pneumonia could present this fashion. Similar findings noted on prior exam. Electronically Signed   By: [place]   On: 11/13/2019 05:22    Procedures Procedures (including critical care time)  Medications Ordered in ED Medications  LORazepam (  ATIVAN) injection 0-4 mg (0 mg Intravenous Not Given 11/14/19 0521)    Or  LORazepam (ATIVAN) tablet 0-4 mg ( Oral See Alternative 11/14/19 0521)  LORazepam (ATIVAN) injection 0-4 mg (has no administration in time range)    Or  LORazepam (ATIVAN) tablet 0-4 mg (has no administration in time range)  thiamine (VITAMIN B-1) tablet 100 mg (100 mg Oral Given 11/13/19 0917)    Or  thiamine (B-1) injection  100 mg ( Intravenous See Alternative 11/13/19 0917)  Chlorhexidine Gluconate Cloth 2 % PADS 6 each (6 each Topical Given 11/13/19 0830)  aspirin EC tablet 81 mg (81 mg Oral Given 11/13/19 0918)  metoprolol succinate (TOPROL-XL) 24 hr tablet 25 mg (25 mg Oral Given 11/13/19 0918)  rosuvastatin (CRESTOR) tablet 40 mg (40 mg Oral Given 11/13/19 0917)  insulin glargine (LANTUS) injection 45 Units (45 Units Subcutaneous Given 11/13/19 1018)  potassium chloride SA (KLOR-CON) CR tablet 20 mEq (20 mEq Oral Given 11/13/19 0919)  ipratropium-albuterol (DUONEB) 0.5-2.5 (3) MG/3ML nebulizer solution 3 mL (has no administration in time range)  mometasone-formoterol (DULERA) 100-5 MCG/ACT inhaler 2 puff (2 puffs Inhalation Given 11/13/19 1953)  umeclidinium bromide (INCRUSE ELLIPTA) 62.5 MCG/INH 1 puff (1 puff Inhalation Not Given 11/13/19 1016)  sodium chloride flush (NS) 0.9 % injection 3 mL (3 mLs Intravenous Given 11/13/19 2125)  sodium chloride flush (NS) 0.9 % injection 3 mL (has no administration in time range)  0.9 %  sodium chloride infusion (has no administration in time range)  acetaminophen (TYLENOL) tablet 650 mg (has no administration in time range)  heparin injection 5,000 Units (5,000 Units Subcutaneous Not Given 11/14/19 0551)  furosemide (LASIX) injection 60 mg (60 mg Intravenous Given 11/13/19 2124)  insulin aspart (novoLOG) injection 0-5 Units (3 Units Subcutaneous Given 11/13/19 2129)  chlorhexidine (PERIDEX) 0.12 % solution 15 mL (15 mLs Mouth Rinse Not Given 11/13/19 2054)  MEDLINE mouth rinse (15 mLs Mouth Rinse Given 11/13/19 1701)  polyethylene glycol (MIRALAX / GLYCOLAX) packet 17 g (has no administration in time range)  senna-docusate (Senokot-S) tablet 1 tablet (1 tablet Oral Given 11/13/19 2124)  insulin aspart (novoLOG) injection 0-15 Units (has no administration in time range)  furosemide (LASIX) injection 80 mg (80 mg Intravenous Given 11/13/19 0547)  Living Better with Heart  Failure Book ( Does not apply Given 11/13/19 0919)     Initial Impression / Assessment and Plan / ED Course  I have reviewed the triage vital signs and the nursing notes.  Pertinent labs & imaging results that were available during my care of the patient were reviewed by me and considered in my medical decision making (see chart for details).  Clinical Course as of Nov 13 746  Mon Nov 13, 2019  [id number] EKG 12-Lead [ET]    Clinical Course User Index [ET] Kandice Robinsons      Patient is brought to the emergency department for evaluation of shortness of breath.  Patient had sudden onset of shortness of breath just prior to arrival.  Patient has a known history of systolic congestive heart failure and flash pulmonary edema.  He has printed in similar fashion in the past.  Patient was very hypertensive for EMS, was administered sublingual nitroglycerin with mild improvement of his blood pressure and breathing.  He arrives on CPAP.  He was hypoxic on room air requiring significant respiratory support during transport.  Patient still in distress at arrival.  Portable chest x-ray shows bilateral pulmonary edema, right greater than left.  This is similar to previous chest x-rays when he presented with congestive heart failure in the past.  Patient maintained on BiPAP here in the ER with excellent oxygenation.  Will initiate diuresis, admit to [facility].  CRITICAL CARE Performed by: Orpah Greek   Total critical care time: 35 minutes  Critical care time was exclusive of separately billable procedures and treating other patients.  Critical care was necessary to treat or prevent imminent or life-threatening deterioration.  Critical care was time spent personally by me on the following activities: development of treatment plan with patient and/or surrogate as well as nursing, discussions with consultants, evaluation of patient's response to treatment, examination of patient,  obtaining history from patient or surrogate, ordering and performing treatments and interventions, ordering and review of laboratory studies, ordering and review of radiographic studies, pulse oximetry and re-evaluation of patient's condition.    Final Clinical Impressions(s) / ED Diagnoses   Final diagnoses:  Acute on chronic systolic congestive heart failure ([place])  Acute respiratory failure with hypoxia [facility])    ED Discharge Orders    None       Orpah Greek, MD 11/13/19 0540    Orpah Greek, MD 11/14/19 [phone]

## 2019-11-13 NOTE — TOC Initial Note (Signed)
Transition of Care [facility]) - Initial/Assessment Note    Patient Details  Name: Antonio [facility] MRN: [id number] Date of Birth: Aug 23, 1961  [facility] CM/SW Contact:    Shade Flood, LCSW Phone Number: 11/13/2019, 3:14 PM  Clinical Narrative:                  Pt admitted from home. TOC consulted for [facility] Heart Failure screen. Met with pt today to assess. Per pt, he is employed full time. He is aware of his CHF diagnosis and he states that he has a scale and he weighs himself daily. He states that he is familiar with dietary recommendations and follows them. Pt reports that he is independent in ADLs at home.  Pt will not qualify for [facility] as he is not home-bound. Pt does not identify any TOC needs for dc. Will be available if needs arise.  Expected Discharge Plan: Home/Self Care Barriers to Discharge: Continued Medical Work up   Patient Goals and CMS Choice        Expected Discharge Plan and Services Expected Discharge Plan: Home/Self Care In-house Referral: Clinical Social Work                                            Prior Living Arrangements/Services   Lives with:: Spouse Patient language and need for interpreter reviewed:: Yes Do you feel safe going back to the place where you live?: Yes      Need for Family Participation in Patient Care: No (Comment) Care giver support system in place?: Yes (comment)   Criminal Activity/Legal Involvement Pertinent to Current Situation/Hospitalization: No - Comment as needed  Activities of Daily Living Home Assistive Devices/Equipment: Eyeglasses, CBG Meter ADL Screening (condition at time of admission) Patient's cognitive ability adequate to safely complete daily activities?: Yes Is the patient deaf or have difficulty hearing?: No Does the patient have difficulty seeing, even when wearing glasses/contacts?: No Does the patient have difficulty concentrating, remembering, or making decisions?: No Patient  able to express need for assistance with ADLs?: Yes Does the patient have difficulty dressing or bathing?: No Independently performs ADLs?: Yes (appropriate for developmental age) Does the patient have difficulty walking or climbing stairs?: No Weakness of Legs: None Weakness of Arms/Hands: None  Permission Sought/Granted                  Emotional Assessment Appearance:: Appears stated age Attitude/Demeanor/Rapport: Engaged Affect (typically observed): Pleasant Orientation: : Oriented to Self, Oriented to Place, Oriented to  Time, Oriented to Situation Alcohol / Substance Use: Not Applicable Psych Involvement: No (comment)  Admission diagnosis:  Acute on chronic systolic congestive heart failure (HCC) [I50.23] Acute respiratory failure with hypoxia ([place]) [J96.01] Patient Active Problem List   Diagnosis Date Noted  . Flash pulmonary edema ([place]) 11/13/2019  . Uncontrolled type 1 diabetes mellitus with hyperglycemia ([place]) 10/11/2019  . Hospital discharge follow-up 04/15/2019  . Pulmonary edema 04/05/2019  . Muscle pain 02/28/2018  . Chronic systolic (congestive) heart failure ([place]) 02/01/2018  . DM type 2 causing vascular disease ([place]) 10/18/2017  . Personal history of noncompliance with medical treatment, presenting hazards to health 10/18/2017  . Lumbar degenerative disc disease 06/20/2017  . Chronic systolic heart failure ([place]) 11/01/2016  . Acute respiratory failure with hypoxia ([place])   . Prolonged QT interval 10/07/2016  . Coronary artery disease involving coronary bypass  graft of native heart with angina pectoris ([place])   . Abnormal echocardiogram   . Acute on chronic combined systolic and diastolic CHF (congestive heart failure) ([place])   . Allergic rhinitis 05/06/2015  . NICM (nonischemic cardiomyopathy) ([place]) 05/23/2012  . Alcohol abuse 05/23/2012  . Arteriosclerotic cardiovascular disease (ASCVD) 05/23/2012  . ED (erectile dysfunction) 05/10/2012  . Overweight  08/12/2010  . Insulin-requiring or dependent type II diabetes mellitus ([place]) 03/08/2008  . Mixed hyperlipidemia 03/08/2008  . Essential hypertension 03/08/2008   PCP:  [name], MD Pharmacy:   [facility] [place] [address] [place] [place] [address] Phone: [phone] Fax: [phone]     Social Determinants of Health (SDOH) Interventions    Readmission Risk Interventions Readmission Risk Prevention Plan 11/13/2019 04/05/2019  Medication Screening Complete Complete  Transportation Screening Complete Complete  Some recent data might be hidden

## 2019-11-13 NOTE — Progress Notes (Signed)
*  PRELIMINARY RESULTS* Echocardiogram 2D Echocardiogram has been performed.  Samuel Germany 11/13/2019, 10:25 AM

## 2019-11-14 LAB — BASIC METABOLIC PANEL
Anion gap: 11 (ref 5–15)
BUN: 22 mg/dL — ABNORMAL HIGH (ref 6–20)
CO2: 28 mmol/L (ref 22–32)
Calcium: 8.9 mg/dL (ref 8.9–10.3)
Chloride: 100 mmol/L (ref 98–111)
Creatinine, Ser: 1.4 mg/dL — ABNORMAL HIGH (ref 0.61–1.24)
GFR calc Af Amer: >60 mL/min (ref >60)
GFR calc non Af Amer: 55 mL/min — ABNORMAL LOW (ref >60)
Glucose, Bld: 216 mg/dL — ABNORMAL HIGH (ref 70–99)
Potassium: 3.8 mmol/L (ref 3.5–5.1)
Sodium: 139 mmol/L (ref 135–145)

## 2019-11-14 LAB — GLUCOSE, CAPILLARY
Glucose-Capillary: 206 mg/dL — ABNORMAL HIGH (ref 70–99)
Glucose-Capillary: 248 mg/dL — ABNORMAL HIGH (ref 70–99)

## 2019-11-14 MED ORDER — FUROSEMIDE 40 MG PO TABS: 40.0000 mg | ORAL_TABLET | Freq: Two times a day (BID) | ORAL | 2 refills | Status: DC

## 2019-11-14 MED ORDER — LOSARTAN POTASSIUM 25 MG PO TABS: 25.0000 mg | ORAL_TABLET | Freq: Every day | ORAL | 2 refills | Status: DC

## 2019-11-14 NOTE — Progress Notes (Signed)
Discharge instructions provided to both pt and wife. Living with Heart Failure booklet reviewed once more with pt and wife and sent home with pt. Pt had no further questions.

## 2019-11-14 NOTE — Discharge Summary (Signed)
Physician Discharge Summary  Antonio Cobb [id number] DOB: 1961-08-09 DOA: 11/13/2019  PCP: [name], MD  Admit date: 11/13/2019 Discharge date: 11/14/2019  Time spent: 35 minutes  Recommendations for Outpatient Follow-up:  1. Repeat basic metabolic panel to follow lites and renal function 2. Reassess blood pressure and adjust antihypertensive regimen as needed 3. Close follow-up the patient CBGs with further adjustment to hypoglycemic regimen as required.   Discharge Diagnoses:  Principal Problem:   Acute respiratory failure with hypoxia ([place]) Active Problems:   Insulin-requiring or dependent type II diabetes mellitus ([place])   Mixed hyperlipidemia   Alcohol abuse   Acute on chronic combined systolic and diastolic CHF (congestive heart failure) ([facility])   Prolonged QT interval   Personal history of noncompliance with medical treatment, presenting hazards to health   Flash pulmonary edema ([place])   Discharge Condition: Stable and improved.  Patient discharged home with instruction to follow-up with PCP and cardiology service as an outpatient.  He remains to be a full code.  Diet recommendation: Heart healthy diet and modify carbohydrates.  Filed Weights   11/13/19 0822 11/14/19 0437  Weight: 93.2 kg 91.1 kg    History of present illness:  58 y.o. male with past medical history significant for alcohol abuse, coronary artery disease status post STEMI with DES to left circumflex in 0000000, systolic heart failure with ejection fraction 25 to 30% and is status post ICD, COPD, type 2 diabetes mellitus poorly controlled and insulin-dependent, essential hypertension, hyperlipidemia, chronic kidney disease a stage IIIa; who presented to the hospital secondary to increased shortness of breath.  Patient reports being compliant with his medications and doing better about sodium control.  He was in his usual state of health the night prior to admission when he woke up to go to the  bathroom and experienced significant shortness of breath.  He denies any chest pain or palpitations, no nausea, no vomiting, no headaches, no focal weakness, no dysuria, no hematuria, no melena, no hematochezia no abdominal pain or any other complaints.   In the ED work-up demonstrated elevated BNP, hyperglycemia with blood sugar in the 380 range, chest x-ray with bilateral vascular congestion and interstitial edema.  Patient oxygen saturation dropped to the point of requiring to be placed on BiPAP.  IV Lasix 80 mg were provided by ED doctor and [facility] was contacted to admit patient for further evaluation and management.  Troponin was negative.  Hospital Course:  1-acute respiratory failure with hypoxia: In the setting of acute on chronic systolic heart failure and flash pulmonary edema. -Patient in the responding better and faster than anticipated to IV diuresis. -No further requirement of BiPAP or oxygen supplementation -Feels a stable to go home and continue treatment as an outpatient. -Instructed to follow low-sodium diet, to check his weight on daily basis and to follow-up with cardiology service as instructed. -Continue the use of beta-blocker and resume the use of spironolactone and adjusted dose ARB. -Norvasc has been discontinued at discharge in the setting of low ejection fraction.  2-uncontrolled/poorly controlled insulin requiring type 2 diabetes mellitus with hyperglycemia on presentation and also with chronic kidney disease a stage IIIa nephropathy -Patient instructed to follow modified carbohydrate diet -Resume the use of insulin and closely follow his CBGs to further adjust hypoglycemic regimen as required.  3-hyperlipidemia -Continue statins -Heart healthy diet has been encouraged.  4-alcohol abuse -Cessation counseling has been provided -Plan is for patient to continue thiamine and folic acid -No withdrawal symptoms appreciated -Patient  was kept during hospitalization on the  CIWA protocol.  5-prolonged QT interval -Continue to avoid/minimize medications that can prolong QT -No abnormalities or complaints appreciated-patient is status post ICD.  6-chronic kidney disease a stage IIIa -Has remained stable and at baseline -Repeat basic metabolic panel follow-up visit to reassess electrolytes and renal function.  7-chronic COPD -No wheezing, no shortness of breath and able to speak in full sentences. -Good O2 sat on room air -Continue home bronchodilator nebulizer/inhaler therapy. -Outpatient follow-up with PCP/pulmonologist. Procedures:  See below for x-ray reports.  2D echo:  1. Left ventricular ejection fraction, by visual estimation, is 25%. The left ventricle has severely decreased function. There is mildly increased left ventricular hypertrophy.  2. Multiple segmental abnormalities exist. See findings.  3. Left ventricular diastolic parameters are indeterminate.  4. Moderately dilated left ventricular internal cavity size.  5. Global right ventricle has normal systolic function.The right ventricular size is normal. No increase in right ventricular wall thickness.  6. Left atrial size was normal.  7. Right atrial size was normal.  8. Presence of pericardial fat pad.  9. Mild to moderate aortic valve annular calcification. 10. The mitral valve is grossly normal. Mild mitral valve regurgitation. 11. The tricuspid valve is grossly normal. Tricuspid valve regurgitation is trivial. 12. The aortic valve is tricuspid. Aortic valve regurgitation is trivial. 13. The pulmonic valve was grossly normal. Pulmonic valve regurgitation is not visualized. 14. TR signal is inadequate for assessing pulmonary artery systolic pressure. 15. A pacer wire is visualized. 16. The inferior vena cava is normal in size with greater than 50% respiratory variability, suggesting right atrial pressure of 3 mmHg.  In comparison to the previous echocardiogram(s): Unable to compare  directly with the prior study from September 2018. FINDINGS  Left Ventricle: Left ventricular ejection fraction, by visual estimation, is 25%. The left ventricle has severely decreased function. The left ventricular internal cavity size was moderately dilated left ventricle. There is mildly increased left  ventricular hypertrophy. Left ventricular diastolic parameters are indeterminate.  Consultations:  None  Discharge Exam: Vitals:   11/14/19 0900 11/14/19 1000  BP: (!) 140/101 (!) 142/95  Pulse: 88 86  Resp: (!) 26 (!) 24  Temp:    SpO2: 98% 94%    General: Afebrile, denies chest pain, no nausea, no vomiting, no requiring oxygen supplementation currently and feeling ready to go home. Cardiovascular: S1 and S2, no rubs, no gallops, no JVD on exam. Respiratory: Improved air movement bilaterally, no frank crackles, no wheezing, no using accessory muscle. Abdomen: Soft, nontender, nondistended, positive bowel sounds. Extremities: No cyanosis, no clubbing.  Discharge Instructions   Discharge Instructions    (HEART FAILURE PATIENTS) Call MD:  Anytime you have any of the following symptoms: 1) 3 pound weight gain in 24 hours or 5 pounds in 1 week 2) shortness of breath, with or without a dry hacking cough 3) swelling in the hands, feet or stomach 4) if you have to sleep on extra pillows at night in order to breathe.   Complete by: As directed    Diet - low sodium heart healthy   Complete by: As directed    Discharge instructions   Complete by: As directed    Take medications as prescribed Check your weight on daily basis Continue adequate hydration Stop alcohol consumption completely Arrange follow-up with PCP in 10 days Follow-up with cardiology service as instructed (office will contact you with appointment details). Follow low-sodium diet; less than 2 g of sodium daily.  Continue to be compliant with your medications.     Allergies as of 11/14/2019      Reactions   Other     Seasonal allergies  - has to use inhaler      Medication List    STOP taking these medications   amLODipine 10 MG tablet Commonly known as: NORVASC     TAKE these medications   albuterol 108 (90 Base) MCG/ACT inhaler Commonly known as: VENTOLIN HFA Inhale 2 puffs into the lungs every 6 (six) hours as needed for wheezing or shortness of breath.   Aspirin Adult Low Strength 81 MG EC tablet Generic drug: aspirin Take 81 mg by mouth daily.   budesonide-formoterol 80-4.5 MCG/ACT inhaler Commonly known as: SYMBICORT Inhale 2 puffs into the lungs 2 (two) times daily.   FREESTYLE LITE test strip Generic drug: glucose blood TEST FOUR TIMES DAILY   furosemide 40 MG tablet Commonly known as: LASIX Take 1 tablet (40 mg total) by mouth 2 (two) times daily. Take extra 40mg  of lasix for weight gain of 2-3 pounds overnight and or 5 pounds in a week. What changed: See the new instructions.   Incruse Ellipta 62.5 MCG/INH Aepb Generic drug: umeclidinium bromide Inhale 1 puff into the lungs daily.   insulin regular 100 units/mL injection Commonly known as: NOVOLIN R Inject 0.1 mLs (10 Units total) into the skin 3 (three) times daily before meals.   ipratropium 0.02 % nebulizer solution Commonly known as: ATROVENT Take 2.5 mLs (0.5 mg total) by nebulization 4 (four) times daily.   ipratropium-albuterol 0.5-2.5 (3) MG/3ML Soln Commonly known as: DUONEB Take 3 mLs by nebulization every 6 (six) hours as needed.   Lantus SoloStar 100 UNIT/ML Solostar Pen Generic drug: Insulin Glargine ADMINISTER 50 UNITS UNDER THE SKIN EVERY NIGHT AT BEDTIME   losartan 25 MG tablet Commonly known as: COZAAR Take 1 tablet (25 mg total) by mouth daily. What changed: See the new instructions.   metoprolol succinate 50 MG 24 hr tablet Commonly known as: TOPROL-XL TAKE 1 TABLET BY MOUTH EVERY DAY WITH FOOD   mometasone-formoterol 100-5 MCG/ACT Aero Commonly known as: DULERA Inhale 2 puffs into the  lungs 2 (two) times daily.   nitroGLYCERIN 0.4 MG SL tablet Commonly known as: Nitrostat Place 1 tablet (0.4 mg total) under the tongue every 5 (five) minutes as needed for chest pain.   potassium chloride SA 20 MEQ tablet Commonly known as: KLOR-CON Take 1 tablet (20 mEq total) by mouth daily.   rosuvastatin 40 MG tablet Commonly known as: CRESTOR Take 1 tablet by mouth daily.   spironolactone 25 MG tablet Commonly known as: ALDACTONE TAKE 1 TABLET(25 MG) BY MOUTH DAILY   tiotropium 18 MCG inhalation capsule Commonly known as: Spiriva HandiHaler inhale the contents of one capsule in the handihaler once daily      Allergies  Allergen Reactions  . Other     Seasonal allergies  - has to use inhaler   Follow-up Information    Erma Heritage, PA-C Follow up on 11/24/2019.   Specialties: Physician Assistant, Cardiology Why: Cardiology Hospital Follow-up on 11/24/2019 at 2:00 PM.  Contact information: [place] [address] [phone]        [name], MD. Schedule an appointment as soon as possible for a visit in 10 day(s).   Specialty: Family Medicine Contact information: [address] [place] [address] [phone]        Arnoldo Lenis,  MD .   Specialty: Cardiology Contact information: [address] [place] [address] [phone]           The results of significant diagnostics from this hospitalization (including imaging, microbiology, ancillary and laboratory) are listed below for reference.    Significant Diagnostic Studies: Dg Chest Portable 1 View  Result Date: 11/13/2019 CLINICAL DATA:  Shortness of breath. EXAM: PORTABLE CHEST 1 VIEW COMPARISON:  04/05/2019. FINDINGS: Cardiac pacer stable position. Cardiomegaly with pulmonary venous congestion again noted. Bilateral pulmonary infiltrates/edema, right side greater than left, again noted. No prominent pleural effusion. No pneumothorax.  IMPRESSION: 1. Cardiac pacer stable position. Cardiomegaly with pulmonary venous congestion again noted. 2. Bilateral pulmonary infiltrates/edema, right side greater than left, again noted. Pulmonary edema and or bilateral pneumonia could present this fashion. Similar findings noted on prior exam. Electronically Signed   By: Marcello Moores  Register   On: 11/13/2019 05:22    Microbiology: Recent Results (from the past 240 hour(s))  SARS Coronavirus 2 by RT PCR (hospital order, performed in [facility] hospital lab) Nasopharyngeal Nasopharyngeal Swab     Status: None   Collection Time: 11/13/19  5:21 AM   Specimen: Nasopharyngeal Swab  Result Value Ref Range Status   SARS Coronavirus 2 NEGATIVE NEGATIVE Final    Comment: (NOTE) SARS-CoV-2 target nucleic acids are NOT DETECTED. The SARS-CoV-2 RNA is generally detectable in upper and lower respiratory specimens during the acute phase of infection. The lowest concentration of SARS-CoV-2 viral copies this assay can detect is 250 copies / mL. A negative result does not preclude SARS-CoV-2 infection and should not be used as the sole basis for treatment or other patient management decisions.  A negative result may occur with improper specimen collection / handling, submission of specimen other than nasopharyngeal swab, presence of viral mutation(s) within the areas targeted by this assay, and inadequate number of viral copies (<250 copies / mL). A negative result must be combined with clinical observations, patient history, and epidemiological information. Fact Sheet for Patients:   StrictlyIdeas.no Fact Sheet for Healthcare Providers: BankingDealers.co.za This test is not yet approved or cleared  by the [place] FDA and has been authorized for detection and/or diagnosis of SARS-CoV-2 by FDA under an Emergency Use Authorization (EUA).  This EUA will remain in effect (meaning this test can be used)  for the duration of the COVID-19 declaration under Section 564(b)(1) of the Act, 21 U.S.C. section 360bbb-3(b)(1), unless the authorization is terminated or revoked sooner. Performed at [facility], [address], [place], [place] [address]   MRSA PCR Screening     Status: None   Collection Time: 11/13/19  7:59 AM   Specimen: Nasopharyngeal  Result Value Ref Range Status   MRSA by PCR NEGATIVE NEGATIVE Final    Comment:        The GeneXpert MRSA Assay (FDA approved for NASAL specimens only), is one component of a comprehensive MRSA colonization surveillance program. It is not intended to diagnose MRSA infection nor to guide or monitor treatment for MRSA infections. Performed at [facility], [address] [place],  [address]      Labs: Basic Metabolic Panel: Recent Labs  Lab 11/13/19 0456 11/13/19 0845 11/14/19 0416  NA 135  --  139  K 3.0*  --  3.8  CL 97*  --  100  CO2 26  --  28  GLUCOSE 386*  --  216*  BUN 22*  --  22*  CREATININE 1.45*  --  1.40*  CALCIUM 8.9  --  8.9  MG  --  2.3  --    CBC: Recent Labs  Lab 11/13/19 0456  WBC 9.6  NEUTROABS 6.1  HGB 14.8  HCT 46.5  MCV 91.4  PLT 213   BNP (last 3 results) Recent Labs    04/05/19 0554 11/13/19 0457  BNP 231.0* 252.0*   CBG: Recent Labs  Lab 11/13/19 0834 11/13/19 1104 11/13/19 1614 11/13/19 1952 11/14/19 0746  GLUCAP 221* 169* 268* 239* 206*    Signed:  Barton Dubois MD.  [facility] 11/14/2019, 11:15 AM

## 2019-11-14 NOTE — Progress Notes (Signed)
Inpatient Diabetes Program Recommendations  AACE/ADA: New Consensus Statement on Inpatient Glycemic Control   Target Ranges:  Prepandial:   less than 140 mg/dL      Peak postprandial:   less than 180 mg/dL (1-2 hours)      Critically ill patients:  140 - 180 mg/dL   Results for Antonio Cobb, WEEK (MRN [id number]) as of 11/14/2019 07:57  Ref. Range 11/13/2019 06:01 11/13/2019 08:34 11/13/2019 11:04 11/13/2019 16:14 11/13/2019 19:52 11/14/2019 07:46  Glucose-Capillary Latest Ref Range: 70 - 99 mg/dL 306 (H) 221 (H) 169 (H) 268 (H) 239 (H) 206 (H)    Review of Glycemic Control   Diabetes history: DM2 Outpatient Diabetes medications: Lantus 50 units QHS, Regular 10 units TID with meals Current orders for Inpatient glycemic control: Lantus 45 units QHS, Novolog 0-15 units TID with meals, Novolog 0-5 units QHS  Inpatient Diabetes Program Recommendations:   Insulin - Basal: Please consider increasing Lantus to 48 units QHS.  Insulin-Meal Coverage:  Please consider ordering Novolog 4 units TID with meals for meal coverage if patient eats at least 50% of meals.  Thanks, Barnie Alderman, RN, MSN, CDE Diabetes Coordinator Inpatient Diabetes Program [phone] (Team Pager from 8am to 5pm)

## 2019-11-15 ENCOUNTER — Telehealth: Payer: Self-pay

## 2019-11-15 NOTE — Telephone Encounter (Signed)
[id number]  Attempted to contact patient to complete [facility] telephone call. No answer. Left message requesting call back. Will try again later. 1st attempt

## 2019-11-15 NOTE — Telephone Encounter (Signed)
[id number]  Attempted to contact patient to complete [facility] telephone call. No answer. Left message requesting call back. Will try again later. 2nd attempt

## 2019-11-15 NOTE — Telephone Encounter (Signed)
[id number]  Attempted to contact patient to complete [facility] telephone call. No answer. Left message requesting call back.   3rd attempt

## 2019-11-18 ENCOUNTER — Emergency Department (HOSPITAL_COMMUNITY): Payer: BC Managed Care – PPO

## 2019-11-18 ENCOUNTER — Other Ambulatory Visit: Payer: Self-pay

## 2019-11-18 ENCOUNTER — Inpatient Hospital Stay (HOSPITAL_COMMUNITY)
Admission: EM | Admit: 2019-11-18 | Discharge: 2019-11-20 | DRG: 291 | Disposition: A | Discharge status: Home / Self Care | Payer: BC Managed Care – PPO | Attending: Family Medicine | Admitting: Family Medicine

## 2019-11-18 ENCOUNTER — Encounter (HOSPITAL_COMMUNITY): Payer: Self-pay

## 2019-11-18 DIAGNOSIS — Z6830 Body mass index (BMI) 30.0-30.9, adult: Secondary | ICD-10-CM

## 2019-11-18 DIAGNOSIS — E669 Obesity, unspecified: Secondary | ICD-10-CM | POA: Diagnosis present

## 2019-11-18 DIAGNOSIS — Z833 Family history of diabetes mellitus: Secondary | ICD-10-CM

## 2019-11-18 DIAGNOSIS — Z8249 Family history of ischemic heart disease and other diseases of the circulatory system: Secondary | ICD-10-CM

## 2019-11-18 DIAGNOSIS — I248 Other forms of acute ischemic heart disease: Secondary | ICD-10-CM | POA: Diagnosis present

## 2019-11-18 DIAGNOSIS — F1010 Alcohol abuse, uncomplicated: Secondary | ICD-10-CM | POA: Diagnosis present

## 2019-11-18 DIAGNOSIS — R778 Other specified abnormalities of plasma proteins: Secondary | ICD-10-CM

## 2019-11-18 DIAGNOSIS — Z7951 Long term (current) use of inhaled steroids: Secondary | ICD-10-CM

## 2019-11-18 DIAGNOSIS — I5043 Acute on chronic combined systolic (congestive) and diastolic (congestive) heart failure: Secondary | ICD-10-CM

## 2019-11-18 DIAGNOSIS — R0603 Acute respiratory distress: Secondary | ICD-10-CM | POA: Diagnosis not present

## 2019-11-18 DIAGNOSIS — I428 Other cardiomyopathies: Secondary | ICD-10-CM | POA: Diagnosis present

## 2019-11-18 DIAGNOSIS — E782 Mixed hyperlipidemia: Secondary | ICD-10-CM | POA: Diagnosis not present

## 2019-11-18 DIAGNOSIS — J9621 Acute and chronic respiratory failure with hypoxia: Secondary | ICD-10-CM | POA: Diagnosis present

## 2019-11-18 DIAGNOSIS — R0600 Dyspnea, unspecified: Secondary | ICD-10-CM

## 2019-11-18 DIAGNOSIS — I255 Ischemic cardiomyopathy: Secondary | ICD-10-CM | POA: Diagnosis present

## 2019-11-18 DIAGNOSIS — J309 Allergic rhinitis, unspecified: Secondary | ICD-10-CM | POA: Diagnosis not present

## 2019-11-18 DIAGNOSIS — Z955 Presence of coronary angioplasty implant and graft: Secondary | ICD-10-CM | POA: Diagnosis not present

## 2019-11-18 DIAGNOSIS — I130 Hypertensive heart and chronic kidney disease with heart failure and stage 1 through stage 4 chronic kidney disease, or unspecified chronic kidney disease: Secondary | ICD-10-CM | POA: Diagnosis not present

## 2019-11-18 DIAGNOSIS — I509 Heart failure, unspecified: Secondary | ICD-10-CM

## 2019-11-18 DIAGNOSIS — Z888 Allergy status to other drugs, medicaments and biological substances status: Secondary | ICD-10-CM

## 2019-11-18 DIAGNOSIS — I340 Nonrheumatic mitral (valve) insufficiency: Secondary | ICD-10-CM | POA: Diagnosis present

## 2019-11-18 DIAGNOSIS — Z87891 Personal history of nicotine dependence: Secondary | ICD-10-CM

## 2019-11-18 DIAGNOSIS — E1122 Type 2 diabetes mellitus with diabetic chronic kidney disease: Secondary | ICD-10-CM | POA: Diagnosis present

## 2019-11-18 DIAGNOSIS — F410 Panic disorder [episodic paroxysmal anxiety] without agoraphobia: Secondary | ICD-10-CM | POA: Diagnosis present

## 2019-11-18 DIAGNOSIS — F411 Generalized anxiety disorder: Secondary | ICD-10-CM | POA: Diagnosis present

## 2019-11-18 DIAGNOSIS — R0689 Other abnormalities of breathing: Secondary | ICD-10-CM | POA: Diagnosis not present

## 2019-11-18 DIAGNOSIS — E1151 Type 2 diabetes mellitus with diabetic peripheral angiopathy without gangrene: Secondary | ICD-10-CM | POA: Diagnosis present

## 2019-11-18 DIAGNOSIS — Z95810 Presence of automatic (implantable) cardiac defibrillator: Secondary | ICD-10-CM

## 2019-11-18 DIAGNOSIS — R0602 Shortness of breath: Secondary | ICD-10-CM | POA: Diagnosis not present

## 2019-11-18 DIAGNOSIS — E119 Type 2 diabetes mellitus without complications: Secondary | ICD-10-CM

## 2019-11-18 DIAGNOSIS — I2510 Atherosclerotic heart disease of native coronary artery without angina pectoris: Secondary | ICD-10-CM | POA: Diagnosis present

## 2019-11-18 DIAGNOSIS — I252 Old myocardial infarction: Secondary | ICD-10-CM

## 2019-11-18 DIAGNOSIS — J449 Chronic obstructive pulmonary disease, unspecified: Secondary | ICD-10-CM | POA: Diagnosis present

## 2019-11-18 DIAGNOSIS — N289 Disorder of kidney and ureter, unspecified: Secondary | ICD-10-CM | POA: Diagnosis not present

## 2019-11-18 DIAGNOSIS — N1831 Chronic kidney disease, stage 3a: Secondary | ICD-10-CM | POA: Diagnosis not present

## 2019-11-18 DIAGNOSIS — E876 Hypokalemia: Secondary | ICD-10-CM | POA: Diagnosis not present

## 2019-11-18 DIAGNOSIS — R0902 Hypoxemia: Secondary | ICD-10-CM | POA: Diagnosis not present

## 2019-11-18 DIAGNOSIS — Z825 Family history of asthma and other chronic lower respiratory diseases: Secondary | ICD-10-CM | POA: Diagnosis not present

## 2019-11-18 DIAGNOSIS — R7989 Other specified abnormal findings of blood chemistry: Secondary | ICD-10-CM

## 2019-11-18 DIAGNOSIS — J9601 Acute respiratory failure with hypoxia: Secondary | ICD-10-CM | POA: Diagnosis present

## 2019-11-18 DIAGNOSIS — Z794 Long term (current) use of insulin: Secondary | ICD-10-CM

## 2019-11-18 DIAGNOSIS — Z7982 Long term (current) use of aspirin: Secondary | ICD-10-CM

## 2019-11-18 DIAGNOSIS — E1165 Type 2 diabetes mellitus with hyperglycemia: Secondary | ICD-10-CM | POA: Diagnosis present

## 2019-11-18 DIAGNOSIS — N179 Acute kidney failure, unspecified: Secondary | ICD-10-CM | POA: Diagnosis not present

## 2019-11-18 DIAGNOSIS — Z20828 Contact with and (suspected) exposure to other viral communicable diseases: Secondary | ICD-10-CM | POA: Diagnosis not present

## 2019-11-18 DIAGNOSIS — I10 Essential (primary) hypertension: Secondary | ICD-10-CM | POA: Diagnosis present

## 2019-11-18 DIAGNOSIS — Z79899 Other long term (current) drug therapy: Secondary | ICD-10-CM

## 2019-11-18 DIAGNOSIS — R069 Unspecified abnormalities of breathing: Secondary | ICD-10-CM | POA: Diagnosis not present

## 2019-11-18 DIAGNOSIS — J42 Unspecified chronic bronchitis: Secondary | ICD-10-CM

## 2019-11-18 DIAGNOSIS — J810 Acute pulmonary edema: Secondary | ICD-10-CM

## 2019-11-18 DIAGNOSIS — R000 Tachycardia, unspecified: Secondary | ICD-10-CM | POA: Diagnosis not present

## 2019-11-18 DIAGNOSIS — J811 Chronic pulmonary edema: Secondary | ICD-10-CM | POA: Diagnosis present

## 2019-11-18 HISTORY — DX: Heart failure, unspecified: I50.9

## 2019-11-18 LAB — RAPID URINE DRUG SCREEN, HOSP PERFORMED
Amphetamines: NOT DETECTED
Barbiturates: NOT DETECTED
Benzodiazepines: NOT DETECTED
Cocaine: NOT DETECTED
Opiates: NOT DETECTED
Tetrahydrocannabinol: NOT DETECTED

## 2019-11-18 LAB — CBC WITH DIFFERENTIAL/PLATELET
Abs Immature Granulocytes: 0.08 10*3/uL — ABNORMAL HIGH (ref 0.00–0.07)
Basophils Absolute: 0.2 10*3/uL — ABNORMAL HIGH (ref 0.0–0.1)
Basophils Relative: 1 %
Eosinophils Absolute: 1.7 10*3/uL — ABNORMAL HIGH (ref 0.0–0.5)
Eosinophils Relative: 10 %
HCT: 52.4 % — ABNORMAL HIGH (ref 39.0–52.0)
Hemoglobin: 16.1 g/dL (ref 13.0–17.0)
Immature Granulocytes: 1 %
Lymphocytes Relative: 39 %
Lymphs Abs: 6.6 10*3/uL — ABNORMAL HIGH (ref 0.7–4.0)
MCH: 28.4 pg (ref 26.0–34.0)
MCHC: 30.7 g/dL (ref 30.0–36.0)
MCV: 92.6 fL (ref 80.0–100.0)
Monocytes Absolute: 1.6 10*3/uL — ABNORMAL HIGH (ref 0.1–1.0)
Monocytes Relative: 9 %
Neutro Abs: 7 10*3/uL (ref 1.7–7.7)
Neutrophils Relative %: 40 %
Platelets: 258 10*3/uL (ref 150–400)
RBC: 5.66 MIL/uL (ref 4.22–5.81)
RDW: 12.3 % (ref 11.5–15.5)
WBC: 17.2 10*3/uL — ABNORMAL HIGH (ref 4.0–10.5)
nRBC: 0 % (ref 0.0–0.2)

## 2019-11-18 LAB — BASIC METABOLIC PANEL
Anion gap: 12 (ref 5–15)
BUN: 25 mg/dL — ABNORMAL HIGH (ref 6–20)
CO2: 25 mmol/L (ref 22–32)
Calcium: 9 mg/dL (ref 8.9–10.3)
Chloride: 102 mmol/L (ref 98–111)
Creatinine, Ser: 1.58 mg/dL — ABNORMAL HIGH (ref 0.61–1.24)
GFR calc Af Amer: 55 mL/min — ABNORMAL LOW (ref >60)
GFR calc non Af Amer: 47 mL/min — ABNORMAL LOW (ref >60)
Glucose, Bld: 385 mg/dL — ABNORMAL HIGH (ref 70–99)
Potassium: 3.4 mmol/L — ABNORMAL LOW (ref 3.5–5.1)
Sodium: 139 mmol/L (ref 135–145)

## 2019-11-18 LAB — TROPONIN I (HIGH SENSITIVITY)
Troponin I (High Sensitivity): 30 ng/L — ABNORMAL HIGH (ref <18)
Troponin I (High Sensitivity): 164 ng/L (ref <18)
Troponin I (High Sensitivity): 224 ng/L (ref <18)
Troponin I (High Sensitivity): 228 ng/L (ref <18)
Troponin I (High Sensitivity): 164 ng/L (ref <18)

## 2019-11-18 LAB — GLUCOSE, CAPILLARY
Glucose-Capillary: 187 mg/dL — ABNORMAL HIGH (ref 70–99)
Glucose-Capillary: 121 mg/dL — ABNORMAL HIGH (ref 70–99)
Glucose-Capillary: 144 mg/dL — ABNORMAL HIGH (ref 70–99)

## 2019-11-18 LAB — CBG MONITORING, ED: Glucose-Capillary: 285 mg/dL — ABNORMAL HIGH (ref 70–99)

## 2019-11-18 LAB — BRAIN NATRIURETIC PEPTIDE: B Natriuretic Peptide: 463 pg/mL — ABNORMAL HIGH (ref 0.0–100.0)

## 2019-11-18 LAB — MAGNESIUM: Magnesium: 2.4 mg/dL (ref 1.7–2.4)

## 2019-11-18 LAB — SARS CORONAVIRUS 2 BY RT PCR (HOSPITAL ORDER, PERFORMED IN ~~LOC~~ HOSPITAL LAB): SARS Coronavirus 2: NEGATIVE

## 2019-11-18 MED ORDER — POLYETHYLENE GLYCOL 3350 17 G PO PACK: 17.0000 g | PACK | Freq: Every day | ORAL | Status: DC | PRN

## 2019-11-18 MED ORDER — FUROSEMIDE 10 MG/ML IJ SOLN
40.0000 mg | Freq: Two times a day (BID) | INTRAMUSCULAR | Status: DC
  Administered 2019-11-18 – 2019-11-20 (×3): 40 mg via INTRAVENOUS
  Filled 2019-11-18 (×4): qty 4

## 2019-11-18 MED ORDER — SODIUM CHLORIDE 0.9% FLUSH: 3.0000 mL | INTRAVENOUS | Status: DC | PRN

## 2019-11-18 MED ORDER — NITROGLYCERIN 0.4 MG SL SUBL: 0.4000 mg | SUBLINGUAL_TABLET | SUBLINGUAL | Status: DC | PRN

## 2019-11-18 MED ORDER — TRAMADOL HCL 50 MG PO TABS: 50.0000 mg | ORAL_TABLET | Freq: Three times a day (TID) | ORAL | Status: DC | PRN

## 2019-11-18 MED ORDER — FLUTICASONE FUROATE-VILANTEROL 100-25 MCG/INH IN AEPB
1.0000 | INHALATION_SPRAY | Freq: Every day | RESPIRATORY_TRACT | Status: DC
  Administered 2019-11-18 – 2019-11-20 (×3): 1 via RESPIRATORY_TRACT
  Filled 2019-11-18: qty 28

## 2019-11-18 MED ORDER — METOPROLOL SUCCINATE ER 25 MG PO TB24
12.5000 mg | ORAL_TABLET | Freq: Every day | ORAL | Status: DC
  Administered 2019-11-18 – 2019-11-20 (×3): 12.5 mg via ORAL
  Filled 2019-11-18 (×3): qty 1

## 2019-11-18 MED ORDER — ALBUTEROL SULFATE HFA 108 (90 BASE) MCG/ACT IN AERS
2.0000 | INHALATION_SPRAY | Freq: Four times a day (QID) | RESPIRATORY_TRACT | Status: DC | PRN
  Filled 2019-11-18: qty 6.7

## 2019-11-18 MED ORDER — UMECLIDINIUM BROMIDE 62.5 MCG/INH IN AEPB
1.0000 | INHALATION_SPRAY | Freq: Every day | RESPIRATORY_TRACT | Status: DC
  Administered 2019-11-18 – 2019-11-20 (×3): 1 via RESPIRATORY_TRACT
  Filled 2019-11-18: qty 7

## 2019-11-18 MED ORDER — ONDANSETRON HCL 4 MG PO TABS: 4.0000 mg | ORAL_TABLET | Freq: Four times a day (QID) | ORAL | Status: DC | PRN

## 2019-11-18 MED ORDER — ASPIRIN EC 81 MG PO TBEC
81.0000 mg | DELAYED_RELEASE_TABLET | Freq: Every day | ORAL | Status: DC
  Administered 2019-11-18 – 2019-11-20 (×3): 81 mg via ORAL
  Filled 2019-11-18 (×3): qty 1

## 2019-11-18 MED ORDER — POTASSIUM CHLORIDE CRYS ER 20 MEQ PO TBCR
40.0000 meq | EXTENDED_RELEASE_TABLET | Freq: Once | ORAL | Status: AC
  Administered 2019-11-18: 40 meq via ORAL
  Filled 2019-11-18: qty 2

## 2019-11-18 MED ORDER — SODIUM CHLORIDE 0.9 % IV SOLN: 250.0000 mL | INTRAVENOUS | Status: DC | PRN

## 2019-11-18 MED ORDER — NITROGLYCERIN 2 % TD OINT
1.0000 [in_us] | TOPICAL_OINTMENT | Freq: Once | TRANSDERMAL | Status: AC
  Administered 2019-11-18: 1 [in_us] via TOPICAL
  Filled 2019-11-18: qty 1

## 2019-11-18 MED ORDER — FUROSEMIDE 10 MG/ML IJ SOLN: 40.0000 mg | Freq: Two times a day (BID) | INTRAMUSCULAR | Status: DC

## 2019-11-18 MED ORDER — SODIUM CHLORIDE 0.9% FLUSH
3.0000 mL | Freq: Two times a day (BID) | INTRAVENOUS | Status: DC
  Administered 2019-11-18 – 2019-11-19 (×4): 3 mL via INTRAVENOUS

## 2019-11-18 MED ORDER — BISACODYL 10 MG RE SUPP: 10.0000 mg | Freq: Every day | RECTAL | Status: DC | PRN

## 2019-11-18 MED ORDER — INSULIN GLARGINE 100 UNIT/ML ~~LOC~~ SOLN
50.0000 [IU] | Freq: Every day | SUBCUTANEOUS | Status: DC
  Administered 2019-11-18 – 2019-11-19 (×2): 50 [IU] via SUBCUTANEOUS
  Filled 2019-11-18 (×3): qty 0.5

## 2019-11-18 MED ORDER — INSULIN ASPART 100 UNIT/ML ~~LOC~~ SOLN: 0.0000 [IU] | Freq: Every day | SUBCUTANEOUS | Status: DC

## 2019-11-18 MED ORDER — CHLORHEXIDINE GLUCONATE CLOTH 2 % EX PADS
6.0000 | MEDICATED_PAD | Freq: Every day | CUTANEOUS | Status: DC
  Administered 2019-11-18 – 2019-11-19 (×2): 6 via TOPICAL

## 2019-11-18 MED ORDER — MOMETASONE FURO-FORMOTEROL FUM 100-5 MCG/ACT IN AERO: 2.0000 | INHALATION_SPRAY | Freq: Two times a day (BID) | RESPIRATORY_TRACT | Status: DC

## 2019-11-18 MED ORDER — ACETAMINOPHEN 650 MG RE SUPP: 650.0000 mg | Freq: Four times a day (QID) | RECTAL | Status: DC | PRN

## 2019-11-18 MED ORDER — ROSUVASTATIN CALCIUM 20 MG PO TABS
40.0000 mg | ORAL_TABLET | Freq: Every day | ORAL | Status: DC
  Administered 2019-11-18 – 2019-11-20 (×3): 40 mg via ORAL
  Filled 2019-11-18 (×5): qty 2

## 2019-11-18 MED ORDER — FUROSEMIDE 10 MG/ML IJ SOLN
40.0000 mg | Freq: Once | INTRAMUSCULAR | Status: AC
  Administered 2019-11-18: 40 mg via INTRAVENOUS
  Filled 2019-11-18: qty 4

## 2019-11-18 MED ORDER — INSULIN ASPART 100 UNIT/ML ~~LOC~~ SOLN
0.0000 [IU] | Freq: Three times a day (TID) | SUBCUTANEOUS | Status: DC
  Administered 2019-11-18: 2 [IU] via SUBCUTANEOUS
  Administered 2019-11-18: 3 [IU] via SUBCUTANEOUS
  Administered 2019-11-18: 8 [IU] via SUBCUTANEOUS
  Administered 2019-11-19 (×2): 3 [IU] via SUBCUTANEOUS
  Administered 2019-11-20: 2 [IU] via SUBCUTANEOUS
  Filled 2019-11-18: qty 1

## 2019-11-18 MED ORDER — POTASSIUM CHLORIDE CRYS ER 20 MEQ PO TBCR
20.0000 meq | EXTENDED_RELEASE_TABLET | Freq: Every day | ORAL | Status: DC
  Administered 2019-11-18 – 2019-11-20 (×3): 20 meq via ORAL
  Filled 2019-11-18 (×3): qty 1

## 2019-11-18 MED ORDER — ACETAMINOPHEN 325 MG PO TABS: 650.0000 mg | ORAL_TABLET | Freq: Four times a day (QID) | ORAL | Status: DC | PRN

## 2019-11-18 MED ORDER — ENOXAPARIN SODIUM 40 MG/0.4ML ~~LOC~~ SOLN
40.0000 mg | SUBCUTANEOUS | Status: DC
  Administered 2019-11-18 – 2019-11-20 (×3): 40 mg via SUBCUTANEOUS
  Filled 2019-11-18 (×3): qty 0.4

## 2019-11-18 MED ORDER — ONDANSETRON HCL 4 MG/2ML IJ SOLN: 4.0000 mg | Freq: Four times a day (QID) | INTRAMUSCULAR | Status: DC | PRN

## 2019-11-18 MED ORDER — SPIRONOLACTONE 25 MG PO TABS
25.0000 mg | ORAL_TABLET | Freq: Every day | ORAL | Status: DC
  Administered 2019-11-18 – 2019-11-20 (×3): 25 mg via ORAL
  Filled 2019-11-18 (×5): qty 1

## 2019-11-18 MED ORDER — IPRATROPIUM-ALBUTEROL 0.5-2.5 (3) MG/3ML IN SOLN
3.0000 mL | Freq: Four times a day (QID) | RESPIRATORY_TRACT | Status: DC | PRN
  Administered 2019-11-19: 3 mL via RESPIRATORY_TRACT
  Filled 2019-11-18: qty 3

## 2019-11-18 NOTE — ED Provider Notes (Signed)
[facility] EMERGENCY DEPARTMENT Provider Note   CSN: Antonio Cobb Arrival date & time: 11/18/19  0418    History   Chief Complaint Chief Complaint  Patient presents with   Respiratory Distress    HPI SESAR BOSSIO is a 58 y.o. male.   The history is provided by the EMS personnel. The history is limited by the condition of the patient (Respiratory distress).  He has history of hypertension, diabetes, COPD, CHF and comes in because of difficulty breathing which started tonight.  EMS arrived and found him in respiratory distress and placed him on CPAP.  He denies chest pain or palpitations.  He had recently been admitted to [facility] for heart failure exacerbation.  He denies eating anything with too much salt in it.  There has been no vomiting.  Past Medical History:  Diagnosis Date   Alcohol abuse    CAD (coronary artery disease)    a. s/p inferolateral STEMI in 05/2012 with DES to LCx b. patent stent by cath in 05/2016 with moderate RCA and LAD disease   Cardiomyopathy ([place])    a. LVEF 30-35% by echo in 09/2016. b. 08/2017: echo showing EF remains reduced at 25-30% c. s/p [employer] ICD placement in 01/2018   CHF (congestive heart failure) (HCC)    COPD (chronic obstructive pulmonary disease) ([place])    Diabetes mellitus, type 2 ([place])    Essential hypertension    Mitral regurgitation    Moderate   Mixed hyperlipidemia    Myocardial infarction ([place]) 05/23/2012   Inferolateral STEMI   Obesity     Patient Active Problem List   Diagnosis Date Noted   Flash pulmonary edema ([place]) 11/13/2019   Uncontrolled type 1 diabetes mellitus with hyperglycemia ([place]) 10/11/2019   Hospital discharge follow-up 04/15/2019   Pulmonary edema 04/05/2019   Muscle pain [id number]   Chronic systolic (congestive) heart failure ([place]) 02/01/2018   DM type 2 causing vascular disease ([place]) 10/18/2017   Personal history of noncompliance with medical treatment,  presenting hazards to health 10/18/2017   Lumbar degenerative disc disease [id number]   Chronic systolic heart failure ([place]) 11/01/2016   Acute respiratory failure with hypoxia (HCC)    Prolonged QT interval 10/07/2016   Coronary artery disease involving coronary bypass graft of native heart with angina pectoris (HCC)    Abnormal echocardiogram    Acute on chronic combined systolic and diastolic CHF (congestive heart failure) (HCC)    Allergic rhinitis 05/06/2015   NICM (nonischemic cardiomyopathy) ([place]) 05/23/2012   Alcohol abuse 05/23/2012   Arteriosclerotic cardiovascular disease (ASCVD) 05/23/2012   ED (erectile dysfunction) 05/10/2012   Overweight 08/12/2010   Insulin-requiring or dependent type II diabetes mellitus ([place]) 03/08/2008   Mixed hyperlipidemia 03/08/2008   Essential hypertension 03/08/2008    Past Surgical History:  Procedure Laterality Date   CARDIAC CATHETERIZATION  2 yrs ago   CARDIAC CATHETERIZATION N/A 05/15/2016   Procedure: Right/Left Heart Cath and Coronary Angiography;  Surgeon: [name], MD;  Location: [place] CV LAB;  Service: Cardiovascular;  Laterality: N/A;   CORONARY STENT PLACEMENT  05/23/12   ICD IMPLANT N/A 02/01/2018   Procedure: ICD IMPLANT;  Surgeon: Evans Lance, MD;  Location: [place] CV LAB;  Service: Cardiovascular;  Laterality: N/A;   LEFT HEART CATHETERIZATION WITH CORONARY ANGIOGRAM N/A 05/23/2012   Procedure: LEFT HEART CATHETERIZATION WITH CORONARY ANGIOGRAM;  Surgeon: [name], MD;  Location: [facility] CATH LAB;  Service: Cardiovascular;  Laterality: N/A;   None  PERCUTANEOUS CORONARY STENT INTERVENTION (PCI-S) N/A 05/23/2012   Procedure: PERCUTANEOUS CORONARY STENT INTERVENTION (PCI-S);  Surgeon: [name], MD;  Location: [facility] CATH LAB;  Service: Cardiovascular;  Laterality: N/A;   POLYPECTOMY  10/16/2011   Procedure: POLYPECTOMY;  Surgeon: [name], MD;  Location: [facility] ORS;  Service:  Endoscopy;;  Polypoid Lesion, Transverse and Sigmoid Colon        Home Medications    Prior to Admission medications   Medication Sig Start Date End Date Taking? Authorizing Provider  albuterol (VENTOLIN HFA) 108 (90 Base) MCG/ACT inhaler Inhale 2 puffs into the lungs every 6 (six) hours as needed for wheezing or shortness of breath.    [provider]  aspirin (ASPIRIN ADULT LOW STRENGTH) 81 MG EC tablet Take 81 mg by mouth daily.      [provider]  budesonide-formoterol (SYMBICORT) 80-4.5 MCG/ACT inhaler Inhale 2 puffs into the lungs 2 (two) times daily.    [provider]  furosemide (LASIX) 40 MG tablet Take 1 tablet (40 mg total) by mouth 2 (two) times daily. Take extra 40mg  of lasix for weight gain of 2-3 pounds overnight and or 5 pounds in a week. 11/14/19   Barton Dubois, MD  glucose blood (FREESTYLE LITE) test strip TEST FOUR TIMES DAILY 10/05/19   [name], MD  insulin regular (NOVOLIN R,HUMULIN R) 100 units/mL injection Inject 0.1 mLs (10 Units total) into the skin 3 (three) times daily before meals. 03/27/19   Cassandria Anger, MD  ipratropium (ATROVENT) 0.02 % nebulizer solution Take 2.5 mLs (0.5 mg total) by nebulization 4 (four) times daily. 11/16/18   [name], MD  ipratropium-albuterol (DUONEB) 0.5-2.5 (3) MG/3ML SOLN Take 3 mLs by nebulization every 6 (six) hours as needed. 04/27/16   [name], MD  LANTUS SOLOSTAR 100 UNIT/ML Solostar Pen ADMINISTER 50 UNITS UNDER THE SKIN EVERY NIGHT AT BEDTIME 08/14/19   Cassandria Anger, MD  losartan (COZAAR) 25 MG tablet Take 1 tablet (25 mg total) by mouth daily. 11/14/19   Barton Dubois, MD  metoprolol succinate (TOPROL-XL) 50 MG 24 hr tablet TAKE 1 TABLET BY MOUTH EVERY DAY WITH FOOD 05/11/19   Arnoldo Lenis, MD  mometasone-formoterol (DULERA) 100-5 MCG/ACT AERO Inhale 2 puffs into the lungs 2 (two) times daily.    [provider]  nitroGLYCERIN  (NITROSTAT) 0.4 MG SL tablet Place 1 tablet (0.4 mg total) under the tongue every 5 (five) minutes as needed for chest pain. 05/29/16 11/06/19  Lendon Colonel, NP  potassium chloride SA (K-DUR) 20 MEQ tablet Take 1 tablet (20 mEq total) by mouth daily. 04/11/19   [name], MD  rosuvastatin (CRESTOR) 40 MG tablet Take 1 tablet by mouth daily. 11/11/18   [provider]  spironolactone (ALDACTONE) 25 MG tablet TAKE 1 TABLET(25 MG) BY MOUTH DAILY 06/09/19   Ahmed Prima, [place] M, PA-C  tiotropium (SPIRIVA HANDIHALER) 18 MCG inhalation capsule inhale the contents of one capsule in the handihaler once daily 05/12/17   [name], MD  umeclidinium bromide (INCRUSE ELLIPTA) 62.5 MCG/INH AEPB Inhale 1 puff into the lungs daily.    [provider]    Family History Family History  Problem Relation Age of Onset   Emphysema Mother    Heart attack Mother    Hypertension Mother    Diabetes Mother    Heart disease Mother    COPD Mother    Emphysema Father    COPD Father  Stroke Sister    Asthma Sister    Colon cancer Neg Hx    Anesthesia problems Neg Hx    Hypotension Neg Hx    Malignant hyperthermia Neg Hx    Pseudochol deficiency Neg Hx     Social History Social History   Tobacco Use   Smoking status: Former Smoker    Packs/day: 0.25    Years: 30.00    Pack years: 7.50    Types: Cigars    Quit date: 05/23/2016    Years since quitting: 3.4   Smokeless tobacco: Never Used   Tobacco comment: since early 54s  Substance Use Topics   Alcohol use: Yes    Alcohol/week: 0.0 standard drinks    Comment: occ   Drug use: No     Allergies   Other   Review of Systems Review of Systems  Unable to perform ROS: Severe respiratory distress     Physical Exam Updated Vital Signs Wt 97 kg    BMI 30.68 kg/m   Physical Exam Vitals signs and nursing note reviewed.    58 year old male on CPAP and in severe respiratory acute  distress.  He is unable to speak because of his distress, but is able to nod his head in response to questions.  Vital signs are significant for elevated blood pressure and rapid respiratory rate. Oxygen saturation is 68%, which is hypoxic, improved to 96% when placed on BiPAP. Head is normocephalic and atraumatic. PERRLA, EOMI. Oropharynx is clear. Neck is nontender and supple without adenopathy or JVD. Back is nontender and there is no CVA tenderness. Lungs have coarse breath sounds throughout without overt rales, wheezes, or rhonchi. Chest is nontender. Heart has regular rate and rhythm without murmur. Abdomen is soft, flat, nontender without masses or hepatosplenomegaly and peristalsis is normoactive. Extremities have 1+ edema, full range of motion is present. Skin is diaphoretic without rash. Neurologic: Mental status is normal, cranial nerves are intact, there are no motor or sensory deficits.  ED Treatments / Results  Labs (all labs ordered are listed, but only abnormal results are displayed) Labs Reviewed  BASIC METABOLIC PANEL - Abnormal; Notable for the following components:      Result Value   Potassium 3.4 (*)    Glucose, Bld 385 (*)    BUN 25 (*)    Creatinine, Ser 1.58 (*)    GFR calc non Af Amer 47 (*)    GFR calc Af Amer 55 (*)    All other components within normal limits  BRAIN NATRIURETIC PEPTIDE - Abnormal; Notable for the following components:   B Natriuretic Peptide 463.0 (*)    All other components within normal limits  CBC WITH DIFFERENTIAL/PLATELET - Abnormal; Notable for the following components:   WBC 17.2 (*)    HCT 52.4 (*)    Lymphs Abs 6.6 (*)    Monocytes Absolute 1.6 (*)    Eosinophils Absolute 1.7 (*)    Basophils Absolute 0.2 (*)    Abs Immature Granulocytes 0.08 (*)    All other components within normal limits  TROPONIN I (HIGH SENSITIVITY) - Abnormal; Notable for the following components:   Troponin I (High Sensitivity) 30 (*)    All other  components within normal limits  SARS CORONAVIRUS 2 (TAT 6-24 HRS)  MAGNESIUM  TROPONIN I (HIGH SENSITIVITY)    EKG EKG Interpretation  Date/Time:  Saturday November 18 2019 04:28:27 EST Ventricular Rate:  112 PR Interval:    QRS Duration: 94 QT  Interval:  334 QTC Calculation: 456 R Axis:   11 Text Interpretation: Sinus tachycardia Premature ventricular complexes Abnormal R-wave progression, early transition Nonspecific repol abnormality, diffuse leads vomp 11/13/2019, Premature ventricular complexes are now present Confirmed by Delora Fuel ([id number]) on 11/18/2019 4:35:01 AM   Radiology Dg Chest Port 1 View  Result Date: 11/18/2019 CLINICAL DATA:  Short of breath EXAM: PORTABLE CHEST 1 VIEW COMPARISON:  11/13/2019 FINDINGS: LEFT-sided pacemaker overlies stable cardiac silhouette. Ectatic aorta. There is central venous pulmonary congestion and mild pulmonary edema pattern similar prior. Slight improvement in aeration lung bases compared to prior. No focal consolidation. No pneumothorax. IMPRESSION: 1. Slight improvement in aeration to the lung bases compared to prior. 2. Central venous congestion and mild pulmonary edema. Electronically Signed   By: Suzy Bouchard M.D.   On: 11/18/2019 05:21    Procedures Procedures  CRITICAL CARE Performed by: Delora Fuel Total critical care time: 60 minutes Critical care time was exclusive of separately billable procedures and treating other patients. Critical care was necessary to treat or prevent imminent or life-threatening deterioration. Critical care was time spent personally by me on the following activities: development of treatment plan with patient and/or surrogate as well as nursing, discussions with consultants, evaluation of patient's response to treatment, examination of patient, obtaining history from patient or surrogate, ordering and performing treatments and interventions, ordering and review of laboratory studies, ordering and  review of radiographic studies, pulse oximetry and re-evaluation of patient's condition.  Medications Ordered in ED Medications  nitroGLYCERIN (NITROGLYN) 2 % ointment 1 inch (has no administration in time range)  furosemide (LASIX) injection 40 mg (has no administration in time range)     Initial Impression / Assessment and Plan / ED Course  I have reviewed the triage vital signs and the nursing notes.  Pertinent labs & imaging results that were available during my care of the patient were reviewed by me and considered in my medical decision making (see chart for details).  Acute respiratory distress which seems likely to be heart failure exacerbation.  Old records are reviewed showing that he had been admitted to [facility] from November 30 through December 1 for heart failure exacerbation.  Echocardiogram showed ejection fraction 25%.  5:36 AM Patient is much more comfortable.  He is no longer diaphoretic.  Oxygen saturation is 100% with use of BiPAP.  He is still tachypneic and still has slight use of accessory muscles, but has been able to answer questions.  Blood pressure has come down with topical nitrates, but he has not had any urine output from furosemide.  He is given additional dose of furosemide.  Chest x-ray is consistent with pulmonary edema.  Labs show a mild hypokalemia, mild renal insufficiency which is slightly worse than it was on December 1, elevation of BNP which is greater than it had been at last hospitalization, mild elevation of troponin which is less than it had been at prior hospitalization.  Case is discussed with Dr. Scherrie November of [facility] hospitalist, who agrees to admit the patient.  Final Clinical Impressions(s) / ED Diagnoses   Final diagnoses:  Acute pulmonary edema ([place])  Renal insufficiency  Hypokalemia  Elevated troponin    ED Discharge Orders    None       Delora Fuel, MD [id number] [phone]

## 2019-11-18 NOTE — Progress Notes (Signed)
Contacted Dr. Denton Brick troponin increased to 224. No new orders.

## 2019-11-18 NOTE — Progress Notes (Addendum)
Patient seen and evaluated, chart reviewed, please see EMR for updated orders. Please see full H&P dictated by admitting physician Dr. Scherrie November for same date of service.    Brief Summary 58 y.o. male with medical history significant of alcohol abuse, coronary artery disease status post STEMI with DES to left circumflex in 0000000, systolic heart failure with ejection fraction 25 to 30% and is status post ICD, COPD, type 2 diabetes mellitus poorly controlled and insulin-dependent, essential hypertension, hyperlipidemia, chronic kidney disease a stage IIIa admitted on 11/18/2019 with acute on chronic CHF exacerbation   A/p 1)HFrEF-- h/o  ICM, s/p MI 2013 admitted with acute on chronic systolic dysfunction CHF ---EF in the 25% range, status post prior AICD in 01/2018--- continue IV Lasix and p.o. Aldactone -Patient will discharge 11/14/2019, patient states he has been compliant with medications -Daily weight and fluid input and output monitoring  2) acute on chronic hypoxic respiratory failure--- secondary to #1 above continue oxygen supplementation,-initially required BiPAP, wean off BiPAP, -Treat as above #1  3)DM2-A1c 10.2 reflecting uncontrolled DM, continue Lantus and sliding scale insulin  4) history of EtOH abuse--denies recent use, lorazepam per CIWA protocol, multivitamins as ordered  5)HTN-stable, continue metoprolol 12.5 daily, hold losartan due to kidney concerns  6) COPD--stable, no acute exacerbation at this time, continue bronchodilators, hold off on steroids  7)HLD-continue Crestor 40 mg daily  8)AKI----acute kidney injury -due to worsening renal perfusion in the setting of systolic CHF exacerbation-     creatinine on admission=1.58  ,   baseline creatinine = 1.1 (10/23/2019)       , renally adjust medications, avoid nephrotoxic agents / dehydration /hypotension Hold Losartan   9) elevated troponin in the setting of demand ischemia in the patient with acute on chronic systolic  dysfunction CHF exacerbation compounded by poor renal clearance of troponin due to AKI   Patient seen and evaluated, chart reviewed, please see EMR for updated orders. Please see full H&P dictated by admitting physician Dr. Scherrie November for same date of service.

## 2019-11-18 NOTE — Progress Notes (Signed)
CRITICAL VALUE ALERT  Critical Value:  Troponin 228  Date & Time Notied:  11/18/2019 1740  Provider Notified: Maurene Capes, MD  Orders Received/Actions taken: no new orders

## 2019-11-18 NOTE — ED Notes (Signed)
ED Provider at bedside. 

## 2019-11-18 NOTE — ED Notes (Signed)
FiO2 down to 80 , vent rate down to 10, he is breathing about 20 -26, BP is also down. He is showing improvement. He can talk and is able to take pills now. Still has not urinated 80 of lasix total given , nitro patch in place. Patient does not have foley but should be able to use urinal.

## 2019-11-18 NOTE — Progress Notes (Signed)
Patient on Vent BiPAP , 10 above 8 of peep , 100 percent , f 20 . Has audible rales almost to pulmonary edema. Saturation 100 his resp rate 31 Vt 777 ,ve 23.3  , BP 173/111. Covid test pending.

## 2019-11-18 NOTE — ED Triage Notes (Signed)
Pt was just discharged from [facility],  Returns now with resp distress, cold and clammy,

## 2019-11-18 NOTE — H&P (Signed)
History and Physical    Antonio Cobb DOB: 1961-02-28 DOA: 11/18/2019  PCP: [name], MD  Patient coming from: Home  I have personally briefly reviewed patient's old medical records in [place]  Chief Complaint: Shortness of breath  HPI: Antonio Cobb is a 58 y.o. male with medical history significant of  alcohol abuse, coronary artery disease status post STEMI with DES to left circumflex in 0000000, systolic heart failure with ejection fraction 25 to 30% and is status post ICD, COPD, type 2 diabetes mellitus poorly controlled and insulin-dependent, essential hypertension, hyperlipidemia, chronic kidney disease a stage IIIa who presented to the ER with worsening shortness of breath, dyspnea on exertion, diaphoresis.  Patient states he got up to go to the bathroom and experienced loss of breath and respiratory distress.  He denied any chest pain or palpitations.  Has not had any cough, fever, chills, abdominal pain, nausea, vomiting, increased lower extremity swelling.  Does have some orthopnea at baseline.  Denies any dietary noncompliance.  Claims compliance with medication and says he has not missed any doses. ED Course:  Vital Signs reviewed on presentation, significant for temperature 98.3, respirate 22, heart rate 103, blood pressure 132/92, saturation 100% on BiPAP. Labs reviewed, significant for sodium 139, potassium 3.4, chloride 102, BUN 25, creatinine 1.58, BNP 463, troponin 30, WBC count 17.2, hemoglobin 16.1, hematocrit 52, platelets 258, SARS Covid RT-PCR has been sent. Imaging personally Reviewed, chest x-ray shows central pulmonary vascular congestion with pulmonary edema. EKG personally reviewed, shows sinus tachycardia, no acute ST-T changes.  Review of Systems: As per HPI otherwise 10 point review of systems negative.  All other review of systems is negative except the ones noted above in the HPI.  Past Medical History:  Diagnosis Date   . Alcohol abuse   . CAD (coronary artery disease)    a. s/p inferolateral STEMI in 05/2012 with DES to LCx b. patent stent by cath in 05/2016 with moderate RCA and LAD disease  . Cardiomyopathy ([place])    a. LVEF 30-35% by echo in 09/2016. b. 08/2017: echo showing EF remains reduced at 25-30% c. s/p [employer] ICD placement in 01/2018  . CHF (congestive heart failure) ([place])   . COPD (chronic obstructive pulmonary disease) ([place])   . Diabetes mellitus, type 2 ([place])   . Essential hypertension   . Mitral regurgitation    Moderate  . Mixed hyperlipidemia   . [place]ocardial infarction [place] 05/23/2012   Inferolateral STEMI  . Obesity     Past Surgical History:  Procedure Laterality Date  . CARDIAC CATHETERIZATION  2 yrs ago  . CARDIAC CATHETERIZATION N/A 05/15/2016   Procedure: Right/Left Heart Cath and Coronary Angiography;  Surgeon: [name], MD;  Location: [facility];  Service: Cardiovascular;  Laterality: N/A;  . CORONARY STENT PLACEMENT  05/23/12  . ICD IMPLANT N/A 02/01/2018   Procedure: ICD IMPLANT;  Surgeon: Evans Lance, MD;  Location: [facility];  Service: Cardiovascular;  Laterality: N/A;  . LEFT HEART CATHETERIZATION WITH CORONARY ANGIOGRAM N/A 05/23/2012   Procedure: LEFT HEART CATHETERIZATION WITH CORONARY ANGIOGRAM;  Surgeon: [name], MD;  Location: [facility] CATH LAB;  Service: Cardiovascular;  Laterality: N/A;  . None    . PERCUTANEOUS CORONARY STENT INTERVENTION (PCI-S) N/A 05/23/2012   Procedure: PERCUTANEOUS CORONARY STENT INTERVENTION (PCI-S);  Surgeon: [name], MD;  Location: [facility] CATH LAB;  Service: Cardiovascular;  Laterality: N/A;  . POLYPECTOMY  10/16/2011  Procedure: POLYPECTOMY;  Surgeon: [name], MD;  Location: [facility];  Service: Endoscopy;;  Polypoid Lesion, Transverse and Sigmoid Colon     reports that he quit smoking about 3 years ago. His smoking use included cigars. He has a 7.50 pack-year smoking history. He has never  used smokeless tobacco. He reports current alcohol use. He reports that he does not use drugs.  Allergies  Allergen Reactions  . Other     Seasonal allergies  - has to use inhaler    Family History  Problem Relation Age of Onset  . Emphysema Mother   . Heart attack Mother   . Hypertension Mother   . Diabetes Mother   . Heart disease Mother   . COPD Mother   . Emphysema Father   . COPD Father   . Stroke Sister   . Asthma Sister   . Colon cancer Neg Hx   . Anesthesia problems Neg Hx   . Hypotension Neg Hx   . Malignant hyperthermia Neg Hx   . Pseudochol deficiency Neg Hx    Family history reviewed, noted as above, not pertinent to current presentation.   Prior to Admission medications   Medication Sig Start Date End Date Taking? Authorizing Provider  albuterol (VENTOLIN HFA) 108 (90 Base) MCG/ACT inhaler Inhale 2 puffs into the lungs every 6 (six) hours as needed for wheezing or shortness of breath.    [provider]  aspirin (ASPIRIN ADULT LOW STRENGTH) 81 MG EC tablet Take 81 mg by mouth daily.      [provider]  budesonide-formoterol (SYMBICORT) 80-4.5 MCG/ACT inhaler Inhale 2 puffs into the lungs 2 (two) times daily.    [provider]  furosemide (LASIX) 40 MG tablet Take 1 tablet (40 mg total) by mouth 2 (two) times daily. Take extra 40mg  of lasix for weight gain of 2-3 pounds overnight and or 5 pounds in a week. 11/14/19   Barton Dubois, MD  glucose blood (FREESTYLE LITE) test strip TEST FOUR TIMES DAILY 10/05/19   [name], MD  insulin regular (NOVOLIN R,HUMULIN R) 100 units/mL injection Inject 0.1 mLs (10 Units total) into the skin 3 (three) times daily before meals. 03/27/19   Cassandria Anger, MD  ipratropium (ATROVENT) 0.02 % nebulizer solution Take 2.5 mLs (0.5 mg total) by nebulization 4 (four) times daily. 11/16/18   [name], MD  ipratropium-albuterol (DUONEB) 0.5-2.5 (3) MG/3ML SOLN Take 3 mLs by nebulization  every 6 (six) hours as needed. 04/27/16   [name], MD  LANTUS SOLOSTAR 100 UNIT/ML Solostar Pen ADMINISTER 50 UNITS UNDER THE SKIN EVERY NIGHT AT BEDTIME 08/14/19   Cassandria Anger, MD  losartan (COZAAR) 25 MG tablet Take 1 tablet (25 mg total) by mouth daily. 11/14/19   Barton Dubois, MD  metoprolol succinate (TOPROL-XL) 50 MG 24 hr tablet TAKE 1 TABLET BY MOUTH EVERY DAY WITH FOOD 05/11/19   Arnoldo Lenis, MD  mometasone-formoterol (DULERA) 100-5 MCG/ACT AERO Inhale 2 puffs into the lungs 2 (two) times daily.    [provider]  nitroGLYCERIN (NITROSTAT) 0.4 MG SL tablet Place 1 tablet (0.4 mg total) under the tongue every 5 (five) minutes as needed for chest pain. 05/29/16 11/06/19  Lendon Colonel, NP  potassium chloride SA (K-DUR) 20 MEQ tablet Take 1 tablet (20 mEq total) by mouth daily. 04/11/19   [name], MD  rosuvastatin (CRESTOR) 40 MG tablet Take 1 tablet by mouth daily. 11/11/18  [provider]  spironolactone (ALDACTONE) 25 MG tablet TAKE 1 TABLET(25 MG) BY MOUTH DAILY 06/09/19   Ahmed Prima, [place] M, PA-C  tiotropium (SPIRIVA HANDIHALER) 18 MCG inhalation capsule inhale the contents of one capsule in the handihaler once daily 05/12/17   [name], MD  umeclidinium bromide (INCRUSE ELLIPTA) 62.5 MCG/INH AEPB Inhale 1 puff into the lungs daily.    [provider]    Physical Exam: Vitals:   11/18/19 0500 11/18/19 0530 11/18/19 0544 11/18/19 0545  BP: (!) 132/92 111/81    Pulse: (!) 103 96 96 95  Resp: (!) 22  17 16   Temp:      TempSrc:      SpO2: 100% 100% 100% 100%  Weight:      Height:        Constitutional: NAD, calm, comfortable Vitals:   11/18/19 0500 11/18/19 0530 11/18/19 0544 11/18/19 0545  BP: (!) 132/92 111/81    Pulse: (!) 103 96 96 95  Resp: (!) 22  17 16   Temp:      TempSrc:      SpO2: 100% 100% 100% 100%  Weight:      Height:       Eyes: PERRL, lids and conjunctivae normal ENMT:  Mucous membranes are moist. Posterior pharynx clear of any exudate or lesions.Normal dentition.  Neck: normal, supple, no masses, no thyromegaly Respiratory: clear to auscultation bilaterally, no wheezing, no crackles. Normal respiratory effort. No accessory muscle use.  Cardiovascular: Regular rate and rhythm, no murmurs / rubs / gallops. No extremity edema. 2+ pedal pulses. No carotid bruits.  Abdomen: no tenderness, no masses palpated. No hepatosplenomegaly. Bowel sounds positive.  Musculoskeletal: no clubbing / cyanosis. No joint deformity upper and lower extremities. Good ROM, no contractures. Normal muscle tone.  Skin: no rashes, lesions, ulcers. No induration Neurologic: CN 2-12 grossly intact. Sensation intact, DTR normal. Strength 5/5 in all 4.  Psychiatric: Normal judgment and insight. Alert and oriented x 3. Normal mood.    Decubitus Ulcers: Not present on admission Catheters and tubes: None   Labs on Admission: I have personally reviewed following labs and imaging studies  CBC: Recent Labs  Lab 11/13/19 0456 11/18/19 0427  WBC 9.6 17.2*  NEUTROABS 6.1 7.0  HGB 14.8 16.1  HCT 46.5 52.4*  MCV 91.4 92.6  PLT 213 0000000   Basic Metabolic Panel: Recent Labs  Lab 11/13/19 0456 11/13/19 0845 11/14/19 0416 11/18/19 0427  NA 135  --  139 139  K 3.0*  --  3.8 3.4*  CL 97*  --  100 102  CO2 26  --  28 25  GLUCOSE 386*  --  216* 385*  BUN 22*  --  22* 25*  CREATININE 1.45*  --  1.40* 1.58*  CALCIUM 8.9  --  8.9 9.0  MG  --  2.3  --  2.4   GFR: Estimated Creatinine Clearance: 58.8 mL/min (A) (by C-G formula based on SCr of 1.58 mg/dL (H)). Liver Function Tests: No results for input(s): AST, ALT, ALKPHOS, BILITOT, PROT, ALBUMIN in the last 168 hours. No results for input(s): LIPASE, AMYLASE in the last 168 hours. No results for input(s): AMMONIA in the last 168 hours. Coagulation Profile: No results for input(s): INR, PROTIME in the last 168 hours. Cardiac Enzymes:  No results for input(s): CKTOTAL, CKMB, CKMBINDEX, TROPONINI in the last 168 hours. BNP (last 3 results) No results for input(s): PROBNP in the last 8760 hours. HbA1C: No results for input(s): HGBA1C  in the last 72 hours. CBG: Recent Labs  Lab 11/13/19 1104 11/13/19 1614 11/13/19 1952 11/14/19 0746 11/14/19 1119  GLUCAP 169* 268* 239* 206* 248*   Lipid Profile: No results for input(s): CHOL, HDL, LDLCALC, TRIG, CHOLHDL, LDLDIRECT in the last 72 hours. Thyroid Function Tests: No results for input(s): TSH, T4TOTAL, FREET4, T3FREE, THYROIDAB in the last 72 hours. Anemia Panel: No results for input(s): VITAMINB12, FOLATE, FERRITIN, TIBC, IRON, RETICCTPCT in the last 72 hours. Urine analysis:    Component Value Date/Time   COLORURINE COLORLESS (A) 09/06/2017 0452   APPEARANCEUR CLEAR 09/06/2017 0452   LABSPEC 1.006 09/06/2017 0452   PHURINE 5.0 09/06/2017 0452   GLUCOSEU >=500 (A) 09/06/2017 0452   GLUCOSEU NEG mg/dL 03/04/2007 0829   HGBUR NEGATIVE 09/06/2017 0452   HGBUR negative 11/13/2010 1522   [place] 09/06/2017 0452   [place] 09/06/2017 0452   PROTEINUR NEGATIVE 09/06/2017 0452   UROBILINOGEN 0.2 03/23/2015 1805   NITRITE NEGATIVE 09/06/2017 0452   LEUKOCYTESUR NEGATIVE 09/06/2017 0452    Radiological Exams on Admission: Dg Chest Port 1 View  Result Date: 11/18/2019 CLINICAL DATA:  Short of breath EXAM: PORTABLE CHEST 1 VIEW COMPARISON:  11/13/2019 FINDINGS: LEFT-sided pacemaker overlies stable cardiac silhouette. Ectatic aorta. There is central venous pulmonary congestion and mild pulmonary edema pattern similar prior. Slight improvement in aeration lung bases compared to prior. No focal consolidation. No pneumothorax. IMPRESSION: 1. Slight improvement in aeration to the lung bases compared to prior. 2. Central venous congestion and mild pulmonary edema. Electronically Signed   By: Suzy Bouchard M.D.   On: 11/18/2019 05:21       Assessment/Plan Active Problems:   Insulin-requiring or dependent type II diabetes mellitus ([place])   Mixed hyperlipidemia   Essential hypertension   NICM (nonischemic cardiomyopathy) ([place])   Alcohol abuse   Acute on chronic combined systolic and diastolic CHF (congestive heart failure) ([facility])   Coronary artery disease involving coronary bypass graft of native heart with angina pectoris ([place])   Pulmonary edema   CHF exacerbation ([facility])     Principal Problem: Acute exacerbation of chronic congestive heart failure with reduced ejection fraction: S/p AICD placement Patient recently admitted from 11/13/2019-11/14/2019 with similar presentation.  Has returned back with worsening shortness of breath.  Appears to be a recurrent exacerbation of CHF.  Did have a echocardiogram on 11/13/2019 which showed EF of 25%, severely decreased LV function, moderately dilated LV cavity. Trigger for current episode of CHF is unclear.  No dietary noncompliance.  Claims he is not missed any medication doses.  No AICD discharge.  No chest pain, fever, shortness of breath. Plan: We will place on IV Lasix Continue spironolactone Monitor input output, renal function, daily weights Nitro as needed Continue beta-blockers, hold losartan due to slightly worsened renal function  Other Active Problems: Acute on chronic hypoxemic respiratory failure: Patient requiring BiPAP on presentation.  Feels significantly improved after having received nitrates, Lasix, BiPAP in ER. We will continue BiPAP, continue oxygen supplementation as needed  Hypertension: Continue metoprolol  Hyperlipidemia: Continue Crestor  Insulin-dependent diabetes mellitus type 2: Uncontrolled, most recent A1c 10.2%. Continue home dose of Lantus We will place on sliding scale insulin coverage with fingerstick monitoring  History of alcohol abuse: No recent alcohol use Counseled about cessation  Chronic kidney disease stage III: Creatinine  appears to be close to baseline of around 1.4-1.6.  Chronic COPD: Shortness of breath likely appear to be secondary to CHF exacerbation and not COPD.  We will  continue Dulera, Incruse Ellipta and continue DuoNebs as needed.  Do not believe he needs steroids at this point.  DVT prophylaxis: Heparin subcu Code Status:  Full code Family Communication: N/A  Disposition Plan: Admitted as inpatient for CHF exacerbation, will give diuretics Consults called: N/A Admission status: Inpatient   Lynetta Mare MD [facility]  If 7PM-7AM, please contact night-coverage   11/18/2019, 6:00 AM

## 2019-11-18 NOTE — ED Notes (Signed)
Date and time results received: 11/18/19 10:02 AM (use smartphrase ".now" to insert current time)  Test: Troponin Critical Value: 164  Name of Provider Notified: Emokpae  Orders Received? Or Actions Taken?: Orders Received - See Orders for details

## 2019-11-19 ENCOUNTER — Inpatient Hospital Stay (HOSPITAL_COMMUNITY): Payer: BC Managed Care – PPO

## 2019-11-19 DIAGNOSIS — Z95810 Presence of automatic (implantable) cardiac defibrillator: Secondary | ICD-10-CM | POA: Diagnosis present

## 2019-11-19 DIAGNOSIS — I255 Ischemic cardiomyopathy: Secondary | ICD-10-CM | POA: Diagnosis present

## 2019-11-19 DIAGNOSIS — F410 Panic disorder [episodic paroxysmal anxiety] without agoraphobia: Secondary | ICD-10-CM | POA: Diagnosis present

## 2019-11-19 DIAGNOSIS — I2510 Atherosclerotic heart disease of native coronary artery without angina pectoris: Secondary | ICD-10-CM | POA: Diagnosis present

## 2019-11-19 LAB — CBC
HCT: 44.5 % (ref 39.0–52.0)
Hemoglobin: 13.8 g/dL (ref 13.0–17.0)
MCH: 28 pg (ref 26.0–34.0)
MCHC: 31 g/dL (ref 30.0–36.0)
MCV: 90.4 fL (ref 80.0–100.0)
Platelets: 224 10*3/uL (ref 150–400)
RBC: 4.92 MIL/uL (ref 4.22–5.81)
RDW: 12.4 % (ref 11.5–15.5)
WBC: 11.9 10*3/uL — ABNORMAL HIGH (ref 4.0–10.5)
nRBC: 0 % (ref 0.0–0.2)

## 2019-11-19 LAB — BASIC METABOLIC PANEL
Anion gap: 12 (ref 5–15)
BUN: 25 mg/dL — ABNORMAL HIGH (ref 6–20)
CO2: 25 mmol/L (ref 22–32)
Calcium: 8.9 mg/dL (ref 8.9–10.3)
Chloride: 103 mmol/L (ref 98–111)
Creatinine, Ser: 1.3 mg/dL — ABNORMAL HIGH (ref 0.61–1.24)
GFR calc Af Amer: >60 mL/min (ref >60)
GFR calc non Af Amer: 60 mL/min — ABNORMAL LOW (ref >60)
Glucose, Bld: 190 mg/dL — ABNORMAL HIGH (ref 70–99)
Potassium: 3.6 mmol/L (ref 3.5–5.1)
Sodium: 140 mmol/L (ref 135–145)

## 2019-11-19 LAB — GLUCOSE, CAPILLARY
Glucose-Capillary: 163 mg/dL — ABNORMAL HIGH (ref 70–99)
Glucose-Capillary: 208 mg/dL — ABNORMAL HIGH (ref 70–99)
Glucose-Capillary: 157 mg/dL — ABNORMAL HIGH (ref 70–99)
Glucose-Capillary: 175 mg/dL — ABNORMAL HIGH (ref 70–99)

## 2019-11-19 MED ORDER — LORAZEPAM 2 MG/ML IJ SOLN
2.0000 mg | Freq: Once | INTRAMUSCULAR | Status: AC
  Administered 2019-11-19: 2 mg via INTRAVENOUS
  Filled 2019-11-19: qty 1

## 2019-11-19 MED ORDER — ALPRAZOLAM 0.25 MG PO TABS: 0.2500 mg | ORAL_TABLET | Freq: Two times a day (BID) | ORAL | Status: DC | PRN

## 2019-11-19 MED ORDER — ESCITALOPRAM OXALATE 10 MG PO TABS
5.0000 mg | ORAL_TABLET | Freq: Every day | ORAL | Status: DC
  Administered 2019-11-19 – 2019-11-20 (×2): 5 mg via ORAL
  Filled 2019-11-19 (×2): qty 1

## 2019-11-19 MED ORDER — ALPRAZOLAM 0.5 MG PO TABS
0.5000 mg | ORAL_TABLET | Freq: Every evening | ORAL | Status: DC | PRN
  Administered 2019-11-19: 0.5 mg via ORAL
  Filled 2019-11-19: qty 1

## 2019-11-19 MED ORDER — FUROSEMIDE 10 MG/ML IJ SOLN
60.0000 mg | Freq: Once | INTRAMUSCULAR | Status: AC
  Administered 2019-11-19: 60 mg via INTRAVENOUS
  Filled 2019-11-19: qty 6

## 2019-11-19 MED ORDER — BUSPIRONE HCL 5 MG PO TABS
10.0000 mg | ORAL_TABLET | Freq: Three times a day (TID) | ORAL | Status: DC
  Administered 2019-11-19 – 2019-11-20 (×3): 10 mg via ORAL
  Filled 2019-11-19 (×3): qty 2

## 2019-11-19 MED ORDER — ORAL CARE MOUTH RINSE
15.0000 mL | Freq: Two times a day (BID) | OROMUCOSAL | Status: DC
  Administered 2019-11-19 (×2): 15 mL via OROMUCOSAL

## 2019-11-19 NOTE — Progress Notes (Signed)
Patient Demographics:    Antonio Cobb, is a 58 y.o. male, DOB - 10/25/1961, [id number]  Admit date - 11/18/2019   Admitting Physician Lynetta Mare, MD  Outpatient Primary MD for the patient is [name], MD  LOS - 1  Chief Complaint  Patient presents with  . Respiratory Distress       Subjective:    Antonio Cobb today has no fevers, no emesis,  No chest pain,   Had episode of sudden dyspnea overnight, despite BiPAP and Lasix symptoms persisted but responded to benzos  Assessment  & Plan :    Principal Problem:   Acute on chronic combined systolic and diastolic CHF (congestive heart failure) (HCC) Active Problems:   CAD (coronary artery disease)- s/p STEMI with DES to [facility] in 2013/S/p AICD 01/2018   Ischemic cardiomyopathy with implantable cardioverter-defibrillator (ICD)/s/p STEMI with DES to [place] in 2013/S/p AICD 01/2018   Anxiety attack/panic attacks   Insulin-requiring or dependent type II diabetes mellitus ([place])   Mixed hyperlipidemia   Essential hypertension   Alcohol abuse   Acute respiratory failure with hypoxia ([place])   Pulmonary edema   CHF exacerbation ([place])  Brief Summary 58 y.o.malewith medical history significant ofalcohol abuse, coronary artery disease status post STEMI with DES to left circumflex in 0000000, systolic heart failure with ejection fraction 25 to 30% and is status post ICD, COPD, type 2 diabetes mellitus poorly controlled and insulin-dependent, essential hypertension, hyperlipidemia, chronic kidney disease a stage IIIa admitted on 11/18/2019 with acute on chronic CHF exacerbation   A/p 1)HFrEF-- h/o ICM, s/p MI 2013 with DES admitted with acute on chronic combined systolic and diastolic dysfunction CHF ---EF in the 25% range,  status post prior AICD in 01/2018--- continue IV Lasix and p.o. Aldactone -Patient was discharge 11/14/2019,  patient states he has been compliant with medications -Continues to have dyspnea and hypoxia -Daily weight and fluid input and output monitoring  2)Acute on chronic hypoxic respiratory failure--- secondary to #1 above continue oxygen supplementation,-initially required BiPAP, wean off BiPAP, -Treat as above #1 -Continues to require oxygen at this time, continue to wean down  3)DM2-A1c 10.2 reflecting uncontrolled DM,  --continue Lantus and sliding scale insulin  4) history of EtOH abuse--denies recent use, lorazepam per CIWA protocol, multivitamins as ordered  5)HTN-stable, continue metoprolol 12.5 daily, hold losartan due to kidney concerns  6) COPD--stable, no acute exacerbation at this time, continue bronchodilators, hold off on steroids  7)HLD-continue Crestor 40 mg daily  8)AKI----acute kidney injury -due to worsening renal perfusion in the setting of systolic CHF exacerbation-     creatinine on admission=1.58  ,   baseline creatinine = 1.1 (10/23/2019)       , renally adjust medications, avoid nephrotoxic agents / dehydration /hypotension -Creatinine down to 1.30 Continue to hold Losartan   9) elevated troponin in the setting of demand ischemia in the patient with acute on chronic combined diastolic and systolic dysfunction CHF exacerbation compounded by poor renal clearance of troponin due to AKI-troponin peaked at 228 -Remains chest pain-free, low index of suspicion for ACS   10) anxiety and panic attacks--patient with subjective feeling of impending doom, nausea, dizziness, shortness of breath with tingling of the fingers and feet ---  Responded well to Xanax overnight -Start Lexapro 5 mg daily, start BuSpar 10 mg 3 times daily use Xanax sparingly -Complete abstinence from alcohol encouraged  Disposition/Need for in-Hospital Stay- patient unable to be discharged at this time due to --- acute hypoxic respiratory failure secondary to acute CHF exacerbation requiring IV  diuresis and supplemental oxygen  Code Status : Full  Family Communication:   (patient is alert, awake and coherent) Discussed with wife at bedside  Disposition Plan  : TBD  Consults  :  na  DVT Prophylaxis  :  Lovenox -   - SCDs   Lab Results  Component Value Date   PLT 224 11/19/2019   Inpatient Medications  Scheduled Meds: . aspirin EC  81 mg Oral Daily  . busPIRone  10 mg Oral TID  . Chlorhexidine Gluconate Cloth  6 each Topical Daily  . enoxaparin (LOVENOX) injection  40 mg Subcutaneous Q24H  . escitalopram  5 mg Oral Daily  . fluticasone furoate-vilanterol  1 puff Inhalation Daily  . furosemide  40 mg Intravenous Q12H  . insulin aspart  0-15 Units Subcutaneous TID WC  . insulin aspart  0-5 Units Subcutaneous QHS  . insulin glargine  50 Units Subcutaneous Q2200  . mouth rinse  15 mL Mouth Rinse BID  . metoprolol succinate  12.5 mg Oral Daily  . potassium chloride SA  20 mEq Oral Daily  . rosuvastatin  40 mg Oral Daily  . sodium chloride flush  3 mL Intravenous Q12H  . spironolactone  25 mg Oral Daily  . umeclidinium bromide  1 puff Inhalation Daily   Continuous Infusions: . sodium chloride     PRN Meds:.sodium chloride, acetaminophen **OR** acetaminophen, albuterol, ALPRAZolam, ALPRAZolam, bisacodyl, ipratropium-albuterol, nitroGLYCERIN, ondansetron **OR** ondansetron (ZOFRAN) IV, polyethylene glycol, sodium chloride flush, traMADol    Anti-infectives (From admission, onward)   None        Objective:   Vitals:   11/19/19 0700 11/19/19 0732 11/19/19 0800 11/19/19 1152  BP: (!) 132/92  (!) 144/110   Pulse: 95 91 97 94  Resp: 19 (!) 25 (!) 25 (!) 25  Temp:  (!) 97.5 F (36.4 C)  (!) 96.5 F (35.8 C)  TempSrc:  Axillary  Axillary  SpO2: 96% 100% 98% 98%  Weight:      Height:        Wt Readings from Last 3 Encounters:  11/18/19 94.3 kg  11/14/19 91.1 kg  10/11/19 94.3 kg     Intake/Output Summary (Last 24 hours) at 11/19/2019 1205 Last data  filed at 11/19/2019 0800 Gross per 24 hour  Intake 1080 ml  Output 1825 ml  Net -745 ml   Physical Exam Gen:- Awake Alert, no conversational dyspnea, has some dyspnea on exertion HEENT:- [place].AT, No sclera icterus Nose- [place] 2L/min Neck-Supple Neck,No JVD,.  Lungs-diminished in bases, faint bibasilar rales CV- S1, S2 normal, regular  Abd-  +ve B.Sounds, Abd Soft, No tenderness,    Extremity/Skin:- trace  edema, pedal pulses present  Psych-affect is anxious, oriented x3 Neuro-no new focal deficits, no tremors   Data Review:   Micro Results Recent Results (from the past 240 hour(s))  SARS Coronavirus 2 by RT PCR (hospital order, performed in [facility] hospital lab) Nasopharyngeal Nasopharyngeal Swab     Status: None   Collection Time: 11/13/19  5:21 AM   Specimen: Nasopharyngeal Swab  Result Value Ref Range Status   SARS Coronavirus 2 NEGATIVE NEGATIVE Final    Comment: (NOTE) SARS-CoV-2 target nucleic  acids are NOT DETECTED. The SARS-CoV-2 RNA is generally detectable in upper and lower respiratory specimens during the acute phase of infection. The lowest concentration of SARS-CoV-2 viral copies this assay can detect is 250 copies / mL. A negative result does not preclude SARS-CoV-2 infection and should not be used as the sole basis for treatment or other patient management decisions.  A negative result may occur with improper specimen collection / handling, submission of specimen other than nasopharyngeal swab, presence of viral mutation(s) within the areas targeted by this assay, and inadequate number of viral copies (<250 copies / mL). A negative result must be combined with clinical observations, patient history, and epidemiological information. Fact Sheet for Patients:   StrictlyIdeas.no Fact Sheet for Healthcare Providers: BankingDealers.co.za This test is not yet approved or cleared  by the [place] FDA and has been  authorized for detection and/or diagnosis of SARS-CoV-2 by FDA under an Emergency Use Authorization (EUA).  This EUA will remain in effect (meaning this test can be used) for the duration of the COVID-19 declaration under Section 564(b)(1) of the Act, 21 U.S.C. section 360bbb-3(b)(1), unless the authorization is terminated or revoked sooner. Performed at [facility], [address], [place], [place] [address]   MRSA PCR Screening     Status: None   Collection Time: 11/13/19  7:59 AM   Specimen: Nasopharyngeal  Result Value Ref Range Status   MRSA by PCR NEGATIVE NEGATIVE Final    Comment:        The GeneXpert MRSA Assay (FDA approved for NASAL specimens only), is one component of a comprehensive MRSA colonization surveillance program. It is not intended to diagnose MRSA infection nor to guide or monitor treatment for MRSA infections. Performed at [facility], [address] [place], [place] [address]   SARS Coronavirus 2 by RT PCR (hospital order, performed in [facility] hospital lab) Nasopharyngeal Nasopharyngeal Swab     Status: None   Collection Time: 11/18/19  7:42 AM   Specimen: Nasopharyngeal Swab  Result Value Ref Range Status   SARS Coronavirus 2 NEGATIVE NEGATIVE Final    Comment: (NOTE) SARS-CoV-2 target nucleic acids are NOT DETECTED. The SARS-CoV-2 RNA is generally detectable in upper and lower respiratory specimens during the acute phase of infection. The lowest concentration of SARS-CoV-2 viral copies this assay can detect is 250 copies / mL. A negative result does not preclude SARS-CoV-2 infection and should not be used as the sole basis for treatment or other patient management decisions.  A negative result may occur with improper specimen collection / handling, submission of specimen other than nasopharyngeal swab, presence of viral mutation(s) within the areas targeted by this assay, and inadequate number of viral copies (<250 copies / mL). A negative  result must be combined with clinical observations, patient history, and epidemiological information. Fact Sheet for Patients:   StrictlyIdeas.no Fact Sheet for Healthcare Providers: BankingDealers.co.za This test is not yet approved or cleared  by the [place] FDA and has been authorized for detection and/or diagnosis of SARS-CoV-2 by FDA under an Emergency Use Authorization (EUA).  This EUA will remain in effect (meaning this test can be used) for the duration of the COVID-19 declaration under Section 564(b)(1) of the Act, 21 U.S.C. section 360bbb-3(b)(1), unless the authorization is terminated or revoked sooner. Performed at [facility], [address], [place], [place] [address]     Radiology Reports Dg Chest [place] 1 View  Result Date: 11/19/2019 CLINICAL DATA:  Acute respiratory distress. EXAM:  PORTABLE CHEST 1 VIEW COMPARISON:  11/18/2019 FINDINGS: Pacemaker remains in place. Heart size is within normal limits. Pulmonary vascular congestion again noted but previously seen interstitial infiltrates appear improved. No evidence of pulmonary consolidation, or pleural effusion. IMPRESSION: Resolving diffuse pulmonary interstitial infiltrates/edema. Electronically Signed   By: Marlaine Hind M.D.   On: 11/19/2019 04:47   Dg Chest Port 1 View  Result Date: 11/18/2019 CLINICAL DATA:  Short of breath EXAM: PORTABLE CHEST 1 VIEW COMPARISON:  11/13/2019 FINDINGS: LEFT-sided pacemaker overlies stable cardiac silhouette. Ectatic aorta. There is central venous pulmonary congestion and mild pulmonary edema pattern similar prior. Slight improvement in aeration lung bases compared to prior. No focal consolidation. No pneumothorax. IMPRESSION: 1. Slight improvement in aeration to the lung bases compared to prior. 2. Central venous congestion and mild pulmonary edema. Electronically Signed   By: Suzy Bouchard M.D.   On: 11/18/2019 05:21   Dg Chest  Portable 1 View  Result Date: 11/13/2019 CLINICAL DATA:  Shortness of breath. EXAM: PORTABLE CHEST 1 VIEW COMPARISON:  04/05/2019. FINDINGS: Cardiac pacer stable position. Cardiomegaly with pulmonary venous congestion again noted. Bilateral pulmonary infiltrates/edema, right side greater than left, again noted. No prominent pleural effusion. No pneumothorax. IMPRESSION: 1. Cardiac pacer stable position. Cardiomegaly with pulmonary venous congestion again noted. 2. Bilateral pulmonary infiltrates/edema, right side greater than left, again noted. Pulmonary edema and or bilateral pneumonia could present this fashion. Similar findings noted on prior exam. Electronically Signed   By: [place]   On: 11/13/2019 05:22     CBC Recent Labs  Lab 11/13/19 0456 11/18/19 0427 11/19/19 0520  WBC 9.6 17.2* 11.9*  HGB 14.8 16.1 13.8  HCT 46.5 52.4* 44.5  PLT 213 258 224  MCV 91.4 92.6 90.4  MCH 29.1 28.4 28.0  MCHC 31.8 30.7 31.0  RDW 12.5 12.3 12.4  LYMPHSABS 2.2 6.6*  --   MONOABS 0.5 1.6*  --   EOSABS 0.7* 1.7*  --   BASOSABS 0.1 0.2*  --     Chemistries  Recent Labs  Lab 11/13/19 0456 11/13/19 0845 11/14/19 0416 11/18/19 0427 11/19/19 0520  NA 135  --  139 139 140  K 3.0*  --  3.8 3.4* 3.6  CL 97*  --  100 102 103  CO2 26  --  28 25 25   GLUCOSE 386*  --  216* 385* 190*  BUN 22*  --  22* 25* 25*  CREATININE 1.45*  --  1.40* 1.58* 1.30*  CALCIUM 8.9  --  8.9 9.0 8.9  MG  --  2.3  --  2.4  --    ------------------------------------------------------------------------------------------------------------------ No results for input(s): CHOL, HDL, LDLCALC, TRIG, CHOLHDL, LDLDIRECT in the last 72 hours.  Lab Results  Component Value Date   HGBA1C 10.2 (H) 10/23/2019   ------------------------------------------------------------------------------------------------------------------ No results for input(s): TSH, T4TOTAL, T3FREE, THYROIDAB in the last 72 hours.  Invalid  input(s): FREET3 ------------------------------------------------------------------------------------------------------------------ No results for input(s): VITAMINB12, FOLATE, FERRITIN, TIBC, IRON, RETICCTPCT in the last 72 hours.  Coagulation profile No results for input(s): INR, PROTIME in the last 168 hours.  No results for input(s): DDIMER in the last 72 hours.  Cardiac Enzymes No results for input(s): CKMB, TROPONINI, MYOGLOBIN in the last 168 hours.  Invalid input(s): CK ------------------------------------------------------------------------------------------------------------------    Component Value Date/Time   BNP 463.0 (H) 11/18/2019 [id number]     Roxan Hockey M.D on 11/19/2019 at 12:05 PM  Go to [email or web address] - for contact info  [facility] - Office  [phone]        

## 2019-11-19 NOTE — Progress Notes (Signed)
Took off Bipap at 0800 and placed on 4lpm nasal cannula

## 2019-11-19 NOTE — Progress Notes (Signed)
Pts BP 177/127. Dr. Scherrie November paged to make aware and also asked for anxiety medication. Waiting for call back/orders.

## 2019-11-19 NOTE — Progress Notes (Signed)
Pt awakened from sleep stating he couldn't breathe. HR increased to 130-140s, pt RR-30s. RT paged and made aware, rhonchus breath sounds and wheezing noted upon auscultation. Pt given duoneb and placed on bipap. Dr. Scherrie November made aware, new order for one time dose Lasix to be given. Pt refusing condom catheter and states he can use urinal. Pt sitting on edge of bed. Will continue to monitor pt

## 2019-11-19 NOTE — Progress Notes (Signed)
PT arrived to room 318 via wheelchair from ICU. Oriented to room and staff. Denies c/o. Resp even and non-labored. VSS.

## 2019-11-20 LAB — BASIC METABOLIC PANEL
Anion gap: 11 (ref 5–15)
BUN: 22 mg/dL — ABNORMAL HIGH (ref 6–20)
CO2: 27 mmol/L (ref 22–32)
Calcium: 9.2 mg/dL (ref 8.9–10.3)
Chloride: 101 mmol/L (ref 98–111)
Creatinine, Ser: 1.21 mg/dL (ref 0.61–1.24)
GFR calc Af Amer: >60 mL/min (ref >60)
GFR calc non Af Amer: >60 mL/min (ref >60)
Glucose, Bld: 155 mg/dL — ABNORMAL HIGH (ref 70–99)
Potassium: 3.9 mmol/L (ref 3.5–5.1)
Sodium: 139 mmol/L (ref 135–145)

## 2019-11-20 LAB — GLUCOSE, CAPILLARY
Glucose-Capillary: 144 mg/dL — ABNORMAL HIGH (ref 70–99)
Glucose-Capillary: 120 mg/dL — ABNORMAL HIGH (ref 70–99)

## 2019-11-20 MED ORDER — ALPRAZOLAM 0.25 MG PO TABS: 0.2500 mg | ORAL_TABLET | Freq: Every evening | ORAL | 0 refills | Status: DC | PRN

## 2019-11-20 MED ORDER — BUSPIRONE HCL 10 MG PO TABS: 10.0000 mg | ORAL_TABLET | Freq: Three times a day (TID) | ORAL | 5 refills | Status: DC

## 2019-11-20 MED ORDER — ALBUTEROL SULFATE HFA 108 (90 BASE) MCG/ACT IN AERS: 2.0000 | INHALATION_SPRAY | Freq: Four times a day (QID) | RESPIRATORY_TRACT | 4 refills | Status: DC | PRN

## 2019-11-20 MED ORDER — MULTI-VITAMIN/MINERALS PO TABS: 1.0000 | ORAL_TABLET | Freq: Every day | ORAL | 2 refills | Status: AC

## 2019-11-20 MED ORDER — TORSEMIDE 20 MG PO TABS: 20.0000 mg | ORAL_TABLET | ORAL | 5 refills | Status: DC

## 2019-11-20 MED ORDER — MOMETASONE FURO-FORMOTEROL FUM 100-5 MCG/ACT IN AERO: 2.0000 | INHALATION_SPRAY | Freq: Two times a day (BID) | RESPIRATORY_TRACT | 5 refills | Status: DC

## 2019-11-20 MED ORDER — POTASSIUM CHLORIDE CRYS ER 20 MEQ PO TBCR: 20.0000 meq | EXTENDED_RELEASE_TABLET | Freq: Every day | ORAL | 3 refills | Status: AC

## 2019-11-20 MED ORDER — ACETAMINOPHEN 325 MG PO TABS: 650.0000 mg | ORAL_TABLET | Freq: Four times a day (QID) | ORAL | 1 refills | Status: DC | PRN

## 2019-11-20 MED ORDER — LOSARTAN POTASSIUM 25 MG PO TABS: 25.0000 mg | ORAL_TABLET | Freq: Every day | ORAL | 5 refills | Status: DC

## 2019-11-20 MED ORDER — IPRATROPIUM-ALBUTEROL 0.5-2.5 (3) MG/3ML IN SOLN: 3.0000 mL | Freq: Four times a day (QID) | RESPIRATORY_TRACT | 1 refills | Status: DC | PRN

## 2019-11-20 MED ORDER — METOPROLOL SUCCINATE ER 50 MG PO TB24: ORAL_TABLET | ORAL | 3 refills | Status: DC

## 2019-11-20 MED ORDER — ROSUVASTATIN CALCIUM 40 MG PO TABS: 40.0000 mg | ORAL_TABLET | Freq: Every day | ORAL | 3 refills | Status: AC

## 2019-11-20 MED ORDER — SPIRONOLACTONE 25 MG PO TABS: 25.0000 mg | ORAL_TABLET | Freq: Every day | ORAL | 1 refills | Status: DC

## 2019-11-20 MED ORDER — ASPIRIN 81 MG PO TBEC: 81.0000 mg | DELAYED_RELEASE_TABLET | Freq: Every day | ORAL | 3 refills | Status: DC

## 2019-11-20 MED ORDER — INCRUSE ELLIPTA 62.5 MCG/INH IN AEPB: 1.0000 | INHALATION_SPRAY | Freq: Every day | RESPIRATORY_TRACT | 5 refills | Status: DC

## 2019-11-20 MED ORDER — SERTRALINE HCL 25 MG PO TABS: 25.0000 mg | ORAL_TABLET | Freq: Every day | ORAL | 11 refills | Status: DC

## 2019-11-20 NOTE — Discharge Summary (Signed)
Antonio Cobb, is a 58 y.o. male  DOB 11-Sep-1961  MRN [id number].  Admission date:  11/18/2019  Admitting Physician  Lynetta Mare, MD  Discharge Date:  11/20/2019   Primary MD  [name], MD  Recommendations for primary care physician for things to follow:   1)Very low-salt diet advised 2)Weigh yourself daily, call if you gain more than 3 pounds in 1 day or more than 5 pounds in 1 week as your diuretic medications may need to be adjusted 3)Limit your Fluid  intake to no more than 60 ounces (1.8 Liters) per day 4)Complete abstinence from tobacco and alcohol advised 5)Recheck BMP/kidney and electrolyte blood test with your primary care physician within a week 6)Take 2 tablets (40 mg) every Morning and 1 tablet (20 mg) every evening 7)Take Torsemide  in place of Lasix/furosemide (STOP Lasix/furosemide and START  Torsemide as prescribed instead) 8)Avoid ibuprofen/Advil/Aleve/Motrin/Goody Powders/Naproxen/BC powders/Meloxicam/Diclofenac/Indomethacin and other Nonsteroidal anti-inflammatory medications as these will make you more likely to bleed and can cause stomach ulcers, can also cause Kidney problems.  9) again complete abstinence from ALCOHOL strongly advised  Admission Diagnosis  Hypokalemia [E87.6] Acute pulmonary edema (HCC) [J81.0] Renal insufficiency [N28.9] Elevated troponin [R77.8] Dyspnea and respiratory abnormalities [R06.00, R06.89]   Discharge Diagnosis  Hypokalemia [E87.6] Acute pulmonary edema (HCC) [J81.0] Renal insufficiency [N28.9] Elevated troponin [R77.8] Dyspnea and respiratory abnormalities [R06.00, R06.89]    Principal Problem:   Acute on chronic combined systolic and diastolic CHF (congestive heart failure) (HCC) Active Problems:   CAD (coronary artery disease)- s/p STEMI with DES to [place] in 2013/S/p AICD 01/2018   Ischemic cardiomyopathy with implantable  cardioverter-defibrillator (ICD)/s/p STEMI with DES to [place] in 2013/S/p AICD 01/2018   Anxiety attack/panic attacks   Insulin-requiring or dependent type II diabetes mellitus ([place])   Mixed hyperlipidemia   Essential hypertension   Alcohol abuse   Acute respiratory failure with hypoxia ([place])   Pulmonary edema   CHF exacerbation (HCC)      Past Medical History:  Diagnosis Date   Alcohol abuse    CAD (coronary artery disease)    a. s/p inferolateral STEMI in 05/2012 with DES to LCx b. patent stent by cath in 05/2016 with moderate RCA and LAD disease   Cardiomyopathy ([place])    a. LVEF 30-35% by echo in 09/2016. b. 08/2017: echo showing EF remains reduced at 25-30% c. s/p [employer] ICD placement in 01/2018   CHF (congestive heart failure) (HCC)    COPD (chronic obstructive pulmonary disease) ([place])    Diabetes mellitus, type 2 ([place])    Essential hypertension    Mitral regurgitation    Moderate   Mixed hyperlipidemia    Myocardial infarction ([place]) 05/23/2012   Inferolateral STEMI   Obesity     Past Surgical History:  Procedure Laterality Date   CARDIAC CATHETERIZATION  2 yrs ago   CARDIAC CATHETERIZATION N/A 05/15/2016   Procedure: Right/Left Heart Cath and Coronary Angiography;  Surgeon: [name], MD;  Location: [place]  CV LAB;  Service: Cardiovascular;  Laterality: N/A;   CORONARY STENT PLACEMENT  05/23/12   ICD IMPLANT N/A 02/01/2018   Procedure: ICD IMPLANT;  Surgeon: Evans Lance, MD;  Location: [place] CV LAB;  Service: Cardiovascular;  Laterality: N/A;   LEFT HEART CATHETERIZATION WITH CORONARY ANGIOGRAM N/A 05/23/2012   Procedure: LEFT HEART CATHETERIZATION WITH CORONARY ANGIOGRAM;  Surgeon: [name], MD;  Location: [facility] CATH LAB;  Service: Cardiovascular;  Laterality: N/A;   None     PERCUTANEOUS CORONARY STENT INTERVENTION (PCI-S) N/A 05/23/2012   Procedure: PERCUTANEOUS CORONARY STENT INTERVENTION (PCI-S);  Surgeon: [name], MD;  Location: [facility] CATH LAB;  Service: Cardiovascular;  Laterality: N/A;   POLYPECTOMY  10/16/2011   Procedure: POLYPECTOMY;  Surgeon: [name], MD;  Location: [facility];  Service: Endoscopy;;  Polypoid Lesion, Transverse and Sigmoid Colon       HPI  from the history and physical done on the day of admission:   Chief Complaint: Shortness of breath  HPI: Antonio Cobb is a 58 y.o. male with medical history significant of alcohol abuse, coronary artery disease status post STEMI with DES to left circumflex in 0000000, systolic heart failure with ejection fraction 25 to 30% and is status post ICD, COPD, type 2 diabetes mellitus poorly controlled and insulin-dependent, essential hypertension, hyperlipidemia, chronic kidney disease a stage IIIa who presented to the ER with worsening shortness of breath, dyspnea on exertion, diaphoresis.  Patient states he got up to go to the bathroom and experienced loss of breath and respiratory distress.  He denied any chest pain or palpitations.  Has not had any cough, fever, chills, abdominal pain, nausea, vomiting, increased lower extremity swelling.  Does have some orthopnea at baseline.  Denies any dietary noncompliance.  Claims compliance with medication and says he has not missed any doses. ED Course:  Vital Signs reviewed on presentation, significant for temperature 98.3, respirate 22, heart rate 103, blood pressure 132/92, saturation 100% on BiPAP. Labs reviewed, significant for sodium 139, potassium 3.4, chloride 102, BUN 25, creatinine 1.58, BNP 463, troponin 30, WBC count 17.2, hemoglobin 16.1, hematocrit 52, platelets 258, SARS Covid RT-PCR has been sent. Imaging personally Reviewed, chest x-ray shows central pulmonary vascular congestion with pulmonary edema. EKG personally reviewed, shows sinus tachycardia, no acute ST-T changes.  Review of Systems: As per HPI otherwise 10 point review of systems negative.      Hospital Course:     Brief Summary 58 y.o.malewith medical history significant ofalcohol abuse, coronary artery disease status post STEMI with DES to left circumflex in 0000000, systolic heart failure with ejection fraction 25 to 30% and is status post ICD, COPD, type 2 diabetes mellitus poorly controlled and insulin-dependent, essential hypertension, hyperlipidemia, chronic kidney disease a stage IIIaadmitted on 11/18/2019 with acute on chronic CHF exacerbation   A/p 1)HFrEF--h/oICM, s/p MI 2013 with DES admitted with acute on chronic combined systolic and diastolic dysfunction CHF---EF in the 25% range,  status post prior AICDin 01/2018---improved on IV Lasix and p.o. Aldactone -Fluid balance is negative overall during hospital stay however urine output as per patient was not documented in the last 24 hours because she was moved from ICU to med telemetry -Weight is down to 204 pounds from 213 pounds on admission -Okay to discharge home on p.o. torsemide as ordered -Low-salt diet and avoidance of excessive fluids advised   2)Acute on chronic hypoxic respiratory failure---secondary to #1 above continue oxygen supplementation,-initially required BiPAP, wean off BiPAP, -Hypoxia has  completely resolved, post ambulation O2 sats on room air above 95%  3)DM2-A1c 10.2 reflecting uncontrolled DM,  --continue Lantus and sliding scale insulin -Follow-up with PCP for ongoing management of diabetes  4)history of EtOH abuse--denies recent use, multivitamins as ordered -Abstinence advised  5)HTN-stable, BP elevated, okay to resume metoprolol and losartan at PTA dose  6)COPD--stable, no acute exacerbation at this time, continue bronchodilators, hold off on steroids  7)HLD-continue Crestor 40 mg daily  8)AKI----acute kidney injury-due to worsening renal perfusion in the setting of systolic CHF exacerbation- creatinine on admission=1.58, baseline creatinine = 1.1 (10/23/2019), renally  adjust medications, avoid nephrotoxic agents / dehydration /hypotension -Creatinine down to 1.20 -  9)elevated troponin in the setting of demand ischemia in the patient with acute on chronic combined diastolic and systolic dysfunction CHF exacerbation compounded by poor renal clearance of troponin due to AKI-troponin peaked at 228 -Remains chest pain-free, low index of suspicion for ACS   10) anxiety and panic attacks--patient with subjective feeling of impending doom, nausea, dizziness, shortness of breath with tingling of the fingers and feet -Okay to discharge on Zoloft,  BuSpar 10 mg 3 times daily  -use Xanax sparingly -Complete abstinence from alcohol encouraged  [place] [facility]  Code Status : Full  Family Communication:   (patient is alert, awake and coherent) Discussed with wife at bedside  Consults  :  na  Discharge Condition: stable  Follow UP--- with PCP for repeat BMP within a week  Diet and Activity recommendation:  As advised  Discharge Instructions    Discharge Instructions    Diet - low sodium heart healthy   Complete by: As directed    Diet Carb Modified   Complete by: As directed    Discharge instructions   Complete by: As directed    1)Very low-salt diet advised 2)Weigh yourself daily, call if you gain more than 3 pounds in 1 day or more than 5 pounds in 1 week as your diuretic medications may need to be adjusted 3)Limit your Fluid  intake to no more than 60 ounces (1.8 Liters) per day 4)Complete abstinence from alcohol advised 5)Recheck BMP/kidney and electrolyte blood test with your primary care physician within a week 6)Take 2 tablets (40 mg) every Morning and 1 tablet (20 mg) every evening 7)Take Torsemide  in place of Lasix/furosemide (STOP Lasix/furosemide and START  Torsemide as prescribed instead) 8)Avoid ibuprofen/Advil/Aleve/Motrin/Goody Powders/Naproxen/BC powders/Meloxicam/Diclofenac/Indomethacin and other Nonsteroidal  anti-inflammatory medications as these will make you more likely to bleed and can cause stomach ulcers, can also cause Kidney problems.  9) again complete abstinence from ALCOHOL strongly advised   Increase activity slowly   Complete by: As directed         Discharge Medications     Allergies as of 11/20/2019      Reactions   Other    Seasonal allergies  - has to use inhaler      Medication List    STOP taking these medications   budesonide-formoterol 80-4.5 MCG/ACT inhaler Commonly known as: SYMBICORT   furosemide 40 MG tablet Commonly known as: LASIX   ipratropium 0.02 % nebulizer solution Commonly known as: ATROVENT   tiotropium 18 MCG inhalation capsule Commonly known as: Spiriva HandiHaler     TAKE these medications   acetaminophen 325 MG tablet Commonly known as: TYLENOL Take 2 tablets (650 mg total) by mouth every 6 (six) hours as needed for mild pain, fever or headache (or Fever >/= 101).   albuterol 108 (90 Base) MCG/ACT inhaler  Commonly known as: VENTOLIN HFA Inhale 2 puffs into the lungs every 6 (six) hours as needed for wheezing or shortness of breath.   ALPRAZolam 0.25 MG tablet Commonly known as: XANAX Take 1 tablet (0.25 mg total) by mouth at bedtime as needed for anxiety or sleep (As needed for anxiety/insomnia/panic attack).   aspirin 81 MG EC tablet Take 1 tablet (81 mg total) by mouth daily with breakfast. What changed: when to take this   busPIRone 10 MG tablet Commonly known as: BUSPAR Take 1 tablet (10 mg total) by mouth 3 (three) times daily. For anxiety   FREESTYLE LITE test strip Generic drug: glucose blood TEST FOUR TIMES DAILY   Incruse Ellipta 62.5 MCG/INH Aepb Generic drug: umeclidinium bromide Inhale 1 puff into the lungs daily. What changed: when to take this   insulin regular 100 units/mL injection Commonly known as: NOVOLIN R Inject 0.1 mLs (10 Units total) into the skin 3 (three) times daily before meals.    ipratropium-albuterol 0.5-2.5 (3) MG/3ML Soln Commonly known as: DUONEB Take 3 mLs by nebulization every 6 (six) hours as needed.   Lantus SoloStar 100 UNIT/ML Solostar Pen Generic drug: Insulin Glargine ADMINISTER 50 UNITS UNDER THE SKIN EVERY NIGHT AT BEDTIME   losartan 25 MG tablet Commonly known as: COZAAR Take 1 tablet (25 mg total) by mouth daily.   metoprolol succinate 50 MG 24 hr tablet Commonly known as: TOPROL-XL TAKE 1 TABLET BY MOUTH EVERY DAY WITH FOOD   mometasone-formoterol 100-5 MCG/ACT Aero Commonly known as: DULERA Inhale 2 puffs into the lungs 2 (two) times daily. What changed: when to take this   multivitamin with minerals tablet Take 1 tablet by mouth daily.   nitroGLYCERIN 0.4 MG SL tablet Commonly known as: Nitrostat Place 1 tablet (0.4 mg total) under the tongue every 5 (five) minutes as needed for chest pain.   potassium chloride SA 20 MEQ tablet Commonly known as: KLOR-CON Take 1 tablet (20 mEq total) by mouth daily.   rosuvastatin 40 MG tablet Commonly known as: CRESTOR Take 1 tablet (40 mg total) by mouth daily.   sertraline 25 MG tablet Commonly known as: Zoloft Take 1 tablet (25 mg total) by mouth daily. For anxiety/panic attacks   spironolactone 25 MG tablet Commonly known as: ALDACTONE Take 1 tablet (25 mg total) by mouth daily. What changed: See the new instructions.   torsemide 20 MG tablet Commonly known as: Demadex Take 1 tablet (20 mg total) by mouth See admin instructions. Take 2 tablets (40 mg) every Morning and 1 tablet (20 mg) every evening -Take Torsemide  in place of Lasix/furosemide       Major procedures and Radiology Reports - PLEASE review detailed and final reports for all details, in brief -    Dg Chest Port 1 View  Result Date: 11/19/2019 CLINICAL DATA:  Acute respiratory distress. EXAM: PORTABLE CHEST 1 VIEW COMPARISON:  11/18/2019 FINDINGS: Pacemaker remains in place. Heart size is within normal limits.  Pulmonary vascular congestion again noted but previously seen interstitial infiltrates appear improved. No evidence of pulmonary consolidation, or pleural effusion. IMPRESSION: Resolving diffuse pulmonary interstitial infiltrates/edema. Electronically Signed   By: Marlaine Hind M.D.   On: 11/19/2019 04:47   Dg Chest Port 1 View  Result Date: 11/18/2019 CLINICAL DATA:  Short of breath EXAM: PORTABLE CHEST 1 VIEW COMPARISON:  11/13/2019 FINDINGS: LEFT-sided pacemaker overlies stable cardiac silhouette. Ectatic aorta. There is central venous pulmonary congestion and mild pulmonary edema pattern similar prior. Slight improvement  in aeration lung bases compared to prior. No focal consolidation. No pneumothorax. IMPRESSION: 1. Slight improvement in aeration to the lung bases compared to prior. 2. Central venous congestion and mild pulmonary edema. Electronically Signed   By: Suzy Bouchard M.D.   On: 11/18/2019 05:21   Dg Chest Portable 1 View  Result Date: 11/13/2019 CLINICAL DATA:  Shortness of breath. EXAM: PORTABLE CHEST 1 VIEW COMPARISON:  04/05/2019. FINDINGS: Cardiac pacer stable position. Cardiomegaly with pulmonary venous congestion again noted. Bilateral pulmonary infiltrates/edema, right side greater than left, again noted. No prominent pleural effusion. No pneumothorax. IMPRESSION: 1. Cardiac pacer stable position. Cardiomegaly with pulmonary venous congestion again noted. 2. Bilateral pulmonary infiltrates/edema, right side greater than left, again noted. Pulmonary edema and or bilateral pneumonia could present this fashion. Similar findings noted on prior exam. Electronically Signed   By: [place]   On: 11/13/2019 05:22    Micro Results    Recent Results (from the past 240 hour(s))  SARS Coronavirus 2 by RT PCR (hospital order, performed in [facility] hospital lab) Nasopharyngeal Nasopharyngeal Swab     Status: None   Collection Time: 11/13/19  5:21 AM   Specimen: Nasopharyngeal  Swab  Result Value Ref Range Status   SARS Coronavirus 2 NEGATIVE NEGATIVE Final    Comment: (NOTE) SARS-CoV-2 target nucleic acids are NOT DETECTED. The SARS-CoV-2 RNA is generally detectable in upper and lower respiratory specimens during the acute phase of infection. The lowest concentration of SARS-CoV-2 viral copies this assay can detect is 250 copies / mL. A negative result does not preclude SARS-CoV-2 infection and should not be used as the sole basis for treatment or other patient management decisions.  A negative result may occur with improper specimen collection / handling, submission of specimen other than nasopharyngeal swab, presence of viral mutation(s) within the areas targeted by this assay, and inadequate number of viral copies (<250 copies / mL). A negative result must be combined with clinical observations, patient history, and epidemiological information. Fact Sheet for Patients:   StrictlyIdeas.no Fact Sheet for Healthcare Providers: BankingDealers.co.za This test is not yet approved or cleared  by the [place] FDA and has been authorized for detection and/or diagnosis of SARS-CoV-2 by FDA under an Emergency Use Authorization (EUA).  This EUA will remain in effect (meaning this test can be used) for the duration of the COVID-19 declaration under Section 564(b)(1) of the Act, 21 U.S.C. section 360bbb-3(b)(1), unless the authorization is terminated or revoked sooner. Performed at [facility], [address], [place], [place] [address]   MRSA PCR Screening     Status: None   Collection Time: 11/13/19  7:59 AM   Specimen: Nasopharyngeal  Result Value Ref Range Status   MRSA by PCR NEGATIVE NEGATIVE Final    Comment:        The GeneXpert MRSA Assay (FDA approved for NASAL specimens only), is one component of a comprehensive MRSA colonization surveillance program. It is not intended to diagnose MRSA infection  nor to guide or monitor treatment for MRSA infections. Performed at [facility], [address], [place], [place] [address]   SARS Coronavirus 2 by RT PCR (hospital order, performed in [facility] hospital lab) Nasopharyngeal Nasopharyngeal Swab     Status: None   Collection Time: 11/18/19  7:42 AM   Specimen: Nasopharyngeal Swab  Result Value Ref Range Status   SARS Coronavirus 2 NEGATIVE NEGATIVE Final    Comment: (NOTE) SARS-CoV-2 target nucleic acids are NOT DETECTED. The  SARS-CoV-2 RNA is generally detectable in upper and lower respiratory specimens during the acute phase of infection. The lowest concentration of SARS-CoV-2 viral copies this assay can detect is 250 copies / mL. A negative result does not preclude SARS-CoV-2 infection and should not be used as the sole basis for treatment or other patient management decisions.  A negative result may occur with improper specimen collection / handling, submission of specimen other than nasopharyngeal swab, presence of viral mutation(s) within the areas targeted by this assay, and inadequate number of viral copies (<250 copies / mL). A negative result must be combined with clinical observations, patient history, and epidemiological information. Fact Sheet for Patients:   StrictlyIdeas.no Fact Sheet for Healthcare Providers: BankingDealers.co.za This test is not yet approved or cleared  by the [place] FDA and has been authorized for detection and/or diagnosis of SARS-CoV-2 by FDA under an Emergency Use Authorization (EUA).  This EUA will remain in effect (meaning this test can be used) for the duration of the COVID-19 declaration under Section 564(b)(1) of the Act, 21 U.S.C. section 360bbb-3(b)(1), unless the authorization is terminated or revoked sooner. Performed at [facility], [address] [place], [place] [address]        Today   Subjective    Hakeen Puleio  today has no new complaints -Ambulated in hallways without significant dyspnea on exertion dizziness palpitations or chest discomfort -O2 sats post ambulation on room air stayed above 95%          Patient has been seen and examined prior to discharge   Objective   Blood pressure (!) 153/95, pulse 88, temperature 97.9 F (36.6 C), temperature source Oral, resp. rate 17, height 5\' 10"  (1.778 m), weight 92.9 kg, SpO2 98 %.   Intake/Output Summary (Last 24 hours) at 11/20/2019 1207 Last data filed at 11/19/2019 1500 Gross per 24 hour  Intake 360 ml  Output 200 ml  Net 160 ml    Exam Gen:- Awake Alert, no acute distress  HEENT:- [place].AT, No sclera icterus Neck-Supple Neck,No JVD,.  Lungs-  CTAB , good air movement bilaterally  CV- S1, S2 normal, regular, AICD in situ Abd-  +ve B.Sounds, Abd Soft, No tenderness,    Extremity/Skin:- No  edema,   good pulses Psych-affect is appropriate, oriented x3 Neuro-no new focal deficits, no tremors    Data Review   CBC w Diff:  Lab Results  Component Value Date   WBC 11.9 (H) 11/19/2019   HGB 13.8 11/19/2019   HCT 44.5 11/19/2019   PLT 224 11/19/2019   LYMPHOPCT 39 11/18/2019   MONOPCT 9 11/18/2019   EOSPCT 10 11/18/2019   BASOPCT 1 11/18/2019    CMP:  Lab Results  Component Value Date   NA 139 11/20/2019   K 3.9 11/20/2019   CL 101 11/20/2019   CO2 27 11/20/2019   BUN 22 (H) 11/20/2019   CREATININE 1.21 11/20/2019   CREATININE 1.14 10/23/2019   PROT 6.9 10/23/2019   ALBUMIN 3.9 04/05/2019   BILITOT 0.8 10/23/2019   ALKPHOS 87 04/05/2019   AST 21 10/23/2019   ALT 23 10/23/2019  .   Total Discharge time is about 33 minutes  Roxan Hockey M.D on 11/20/2019 at 12:07 PM  Go to [email or web address] -  for contact info  [facility] - Office  [phone]

## 2019-11-20 NOTE — Progress Notes (Signed)
SATURATION QUALIFICATIONS: (This note is used to comply with regulatory documentation for home oxygen)  Patient Saturations on Room Air at Rest = 100%  Patient Saturations on Room Air while Ambulating = 95%  

## 2019-11-20 NOTE — Plan of Care (Signed)
  Problem: Education: Goal: Knowledge of General Education information will improve Description: Including pain rating scale, medication(s)/side effects and non-pharmacologic comfort measures 11/20/2019 1237 by Venita Sheffield, RN Outcome: Adequate for Discharge 11/20/2019 1224 by Venita Sheffield, RN Outcome: Adequate for Discharge   Problem: Health Behavior/Discharge Planning: Goal: Ability to manage health-related needs will improve 11/20/2019 1237 by Venita Sheffield, RN Outcome: Adequate for Discharge 11/20/2019 1224 by Venita Sheffield, RN Outcome: Adequate for Discharge   Problem: Clinical Measurements: Goal: Ability to maintain clinical measurements within normal limits will improve 11/20/2019 1237 by Venita Sheffield, RN Outcome: Adequate for Discharge 11/20/2019 1224 by Venita Sheffield, RN Outcome: Adequate for Discharge Goal: Will remain free from infection 11/20/2019 1237 by Venita Sheffield, RN Outcome: Adequate for Discharge 11/20/2019 1224 by Venita Sheffield, RN Outcome: Adequate for Discharge Goal: Diagnostic test results will improve 11/20/2019 1237 by Venita Sheffield, RN Outcome: Adequate for Discharge 11/20/2019 1224 by Venita Sheffield, RN Outcome: Adequate for Discharge Goal: Respiratory complications will improve 11/20/2019 1237 by Venita Sheffield, RN Outcome: Adequate for Discharge 11/20/2019 1224 by Venita Sheffield, RN Outcome: Adequate for Discharge Goal: Cardiovascular complication will be avoided 11/20/2019 1237 by Venita Sheffield, RN Outcome: Adequate for Discharge 11/20/2019 1224 by Venita Sheffield, RN Outcome: Adequate for Discharge   Problem: Activity: Goal: Risk for activity intolerance will decrease 11/20/2019 1237 by Venita Sheffield, RN Outcome: Adequate for Discharge 11/20/2019 1224 by Venita Sheffield, RN Outcome: Adequate for Discharge   Problem: Nutrition: Goal: Adequate nutrition will be maintained 11/20/2019 1237 by Venita Sheffield, RN Outcome: Adequate for  Discharge 11/20/2019 1224 by Venita Sheffield, RN Outcome: Adequate for Discharge   Problem: Coping: Goal: Level of anxiety will decrease 11/20/2019 1237 by Venita Sheffield, RN Outcome: Adequate for Discharge 11/20/2019 1224 by Venita Sheffield, RN Outcome: Adequate for Discharge   Problem: Elimination: Goal: Will not experience complications related to bowel motility 11/20/2019 1237 by Venita Sheffield, RN Outcome: Adequate for Discharge 11/20/2019 1224 by Venita Sheffield, RN Outcome: Adequate for Discharge Goal: Will not experience complications related to urinary retention 11/20/2019 1237 by Venita Sheffield, RN Outcome: Adequate for Discharge 11/20/2019 1224 by Venita Sheffield, RN Outcome: Adequate for Discharge   Problem: Pain Managment: Goal: General experience of comfort will improve 11/20/2019 1237 by Venita Sheffield, RN Outcome: Adequate for Discharge 11/20/2019 1224 by Venita Sheffield, RN Outcome: Adequate for Discharge   Problem: Safety: Goal: Ability to remain free from injury will improve 11/20/2019 1237 by Venita Sheffield, RN Outcome: Adequate for Discharge 11/20/2019 1224 by Venita Sheffield, RN Outcome: Adequate for Discharge   Problem: Skin Integrity: Goal: Risk for impaired skin integrity will decrease 11/20/2019 1237 by Venita Sheffield, RN Outcome: Adequate for Discharge 11/20/2019 1224 by Venita Sheffield, RN Outcome: Adequate for Discharge

## 2019-11-20 NOTE — Progress Notes (Signed)
Discharge instructions given. Pt verbalized understanding of f/u appoints and of new medications and medication regime. Pt stable at time of discharge. Ambulated off unit.

## 2019-11-20 NOTE — Discharge Instructions (Signed)
1)Very low-salt diet advised 2)Weigh yourself daily, call if you gain more than 3 pounds in 1 day or more than 5 pounds in 1 week as your diuretic medications may need to be adjusted 3)Limit your Fluid  intake to no more than 60 ounces (1.8 Liters) per day 4)Complete abstinence from tobacco and alcohol advised 5)Recheck BMP/kidney and electrolyte blood test with your primary care physician within a week 6)Take 2 tablets (40 mg) every Morning and 1 tablet (20 mg) every evening 7)Take Torsemide  in place of Lasix/furosemide (STOP Lasix/furosemide and START  Torsemide as prescribed instead) 8)Avoid ibuprofen/Advil/Aleve/Motrin/Goody Powders/Naproxen/BC powders/Meloxicam/Diclofenac/Indomethacin and other Nonsteroidal anti-inflammatory medications as these will make you more likely to bleed and can cause stomach ulcers, can also cause Kidney problems.  9) again complete abstinence from ALCOHOL strongly advised

## 2019-11-21 ENCOUNTER — Telehealth: Payer: Self-pay

## 2019-11-21 NOTE — Telephone Encounter (Signed)
Transition Care Management Follow-up Telephone Call   Date discharged? 11/20/2019               How have you been since you were released from the hospital? patient states he has been feeling good   Do you understand why you were in the hospital? Respiratory distress, CHF   Do you understand the discharge instructions? yes   Where were you discharged to? home   Items Reviewed:  Medications reviewed: yes  Allergies reviewed: yes  Dietary changes reviewed: yes  Referrals reviewed: no new referrals   Functional Questionnaire:   Activities of Daily Living (ADLs):  yes    Any transportation issues/concerns?: no   Any patient concerns? no   Confirmed importance and date/time of follow-up visits scheduled 11/30/2019 at 3pm with DNP     Confirmed with patient if condition begins to worsen call PCP or go to the ER.  Patient was given the office number and encouraged to call back with question or concerns. Yes with verbal understanding

## 2019-11-24 ENCOUNTER — Other Ambulatory Visit (HOSPITAL_COMMUNITY)
Admission: RE | Admit: 2019-11-24 | Discharge: 2019-11-24 | Disposition: A | Discharge status: Home / Self Care | Payer: BC Managed Care – PPO | Source: Ambulatory Visit | Attending: Student | Admitting: Student

## 2019-11-24 ENCOUNTER — Ambulatory Visit (INDEPENDENT_AMBULATORY_CARE_PROVIDER_SITE_OTHER): Payer: BC Managed Care – PPO | Admitting: Student

## 2019-11-24 ENCOUNTER — Other Ambulatory Visit: Payer: Self-pay

## 2019-11-24 ENCOUNTER — Telehealth: Payer: Self-pay

## 2019-11-24 ENCOUNTER — Encounter: Payer: Self-pay | Admitting: Student

## 2019-11-24 VITALS — BP 113/73 | HR 76 | Temp 97.7°F | Ht 70.0 in | Wt 201.0 lb

## 2019-11-24 DIAGNOSIS — N182 Chronic kidney disease, stage 2 (mild): Secondary | ICD-10-CM

## 2019-11-24 DIAGNOSIS — E785 Hyperlipidemia, unspecified: Secondary | ICD-10-CM

## 2019-11-24 DIAGNOSIS — Z95810 Presence of automatic (implantable) cardiac defibrillator: Secondary | ICD-10-CM | POA: Diagnosis not present

## 2019-11-24 DIAGNOSIS — I5022 Chronic systolic (congestive) heart failure: Secondary | ICD-10-CM | POA: Insufficient documentation

## 2019-11-24 DIAGNOSIS — I2510 Atherosclerotic heart disease of native coronary artery without angina pectoris: Secondary | ICD-10-CM | POA: Diagnosis not present

## 2019-11-24 DIAGNOSIS — Z79899 Other long term (current) drug therapy: Secondary | ICD-10-CM | POA: Insufficient documentation

## 2019-11-24 DIAGNOSIS — I10 Essential (primary) hypertension: Secondary | ICD-10-CM

## 2019-11-24 LAB — BASIC METABOLIC PANEL
Anion gap: 10 (ref 5–15)
BUN: 39 mg/dL — ABNORMAL HIGH (ref 6–20)
CO2: 26 mmol/L (ref 22–32)
Calcium: 9.1 mg/dL (ref 8.9–10.3)
Chloride: 101 mmol/L (ref 98–111)
Creatinine, Ser: 1.84 mg/dL — ABNORMAL HIGH (ref 0.61–1.24)
GFR calc Af Amer: 45 mL/min — ABNORMAL LOW (ref >60)
GFR calc non Af Amer: 39 mL/min — ABNORMAL LOW (ref >60)
Glucose, Bld: 157 mg/dL — ABNORMAL HIGH (ref 70–99)
Potassium: 4.3 mmol/L (ref 3.5–5.1)
Sodium: 137 mmol/L (ref 135–145)

## 2019-11-24 MED ORDER — TORSEMIDE 20 MG PO TABS: 40.0000 mg | ORAL_TABLET | Freq: Every day | ORAL | 3 refills | Status: DC

## 2019-11-24 NOTE — Progress Notes (Signed)
Cardiology Office Note    Date:  11/24/2019   ID:  Antonio Cobb, DOB May 29, 1961, MRN [id number]  PCP:  [name], MD  Cardiologist: [name], MD   EP: Dr. Lovena Le  Chief Complaint  Patient presents with  . Hospitalization Follow-up    History of Present Illness:    Antonio Cobb is a 58 y.o. male with past medical history of CAD (s/p STEMI in 2013 with DES to LCx, patent by repeat cath in 05/2016 with moderate residual CAD along RCA and LAD), chronic combined systolic and diastolic CHF (EF [id number] by echo in 09/2016, 25-30% in 08/2017 and 25% by most recent echo in 10/2019, s/p ICD placement in 01/2018), HTN and HLD who presents to the office today for overdue follow-up along with hospital follow-up.  He was last examined by Dr. Lovena Le in 05/2018 and denied any recent chest pain or dyspnea on exertion at that time. No changes were made to his cardiac regimen at that time. Was on Losartan, Toprol-XL and Spironolactone as he had been intolerant to [facility] in the past.   He was most recently admitted to [facility] from 11/13/2019 to 11/14/2019 for evaluation of acute hypoxic respiratory failure in the setting of a CHF exacerbation and flash pulmonary edema. He initially required BiPAP but was weaned down to nasal cannula during admission.  Weight had improved to 200.4 lbs at the time of discharge.  Was again admitted from 12/5  - 11/20/2019 for acute hypoxic respiratory failure in the setting of recurrent CHF exacerbation.  His weight was elevated to 213 lbs at the time of admission and had declined to 204 lbs on discharge. His PTA Lasix 40mg  BID was transitioned to Torsemide 40mg  in AM/20mg  in PM.  Amlodipine was discontinued and Losartan was titrated to 25 mg daily.  In talking with the patient and his wife today, he reports overall doing well since hospital discharge. He denies any recurrent dyspnea on exertion, orthopnea or PND.  No recent chest pain,  palpitations, dizziness, lightheadedness or presyncope  He has been following his weights daily at home and says this has declined to 197 lbs on his home scales. They have been trying to follow a low-sodium diet and do not add extra salt to their food.  He was previously consuming 4-5 shots of liquor daily and denies any recurrent use since his most recent admission.    Past Medical History:  Diagnosis Date  . Alcohol abuse   . CAD (coronary artery disease)    a. s/p inferolateral STEMI in 05/2012 with DES to LCx b. patent stent by cath in 05/2016 with moderate RCA and LAD disease  . Cardiomyopathy ([place])    a. LVEF 30-35% by echo in 09/2016. b. 08/2017: echo showing EF remains reduced at 25-30% c. s/p [employer] ICD placement in 01/2018  . CHF (congestive heart failure) ([place])   . COPD (chronic obstructive pulmonary disease) ([place])   . Diabetes mellitus, type 2 ([place])   . Essential hypertension   . Mitral regurgitation    Moderate  . Mixed hyperlipidemia   . Myocardial infarction ([place]) 05/23/2012   Inferolateral STEMI  . Obesity     Past Surgical History:  Procedure Laterality Date  . CARDIAC CATHETERIZATION  2 yrs ago  . CARDIAC CATHETERIZATION N/A 05/15/2016   Procedure: Right/Left Heart Cath and Coronary Angiography;  Surgeon: [name], MD;  Location: [place] CV LAB;  Service: Cardiovascular;  Laterality: N/A;  .  CORONARY STENT PLACEMENT  05/23/12  . ICD IMPLANT N/A 02/01/2018   Procedure: ICD IMPLANT;  Surgeon: Evans Lance, MD;  Location: [place] CV LAB;  Service: Cardiovascular;  Laterality: N/A;  . LEFT HEART CATHETERIZATION WITH CORONARY ANGIOGRAM N/A 05/23/2012   Procedure: LEFT HEART CATHETERIZATION WITH CORONARY ANGIOGRAM;  Surgeon: [name], MD;  Location: [facility] CATH LAB;  Service: Cardiovascular;  Laterality: N/A;  . None    . PERCUTANEOUS CORONARY STENT INTERVENTION (PCI-S) N/A 05/23/2012   Procedure: PERCUTANEOUS CORONARY STENT INTERVENTION  (PCI-S);  Surgeon: [name], MD;  Location: [facility] CATH LAB;  Service: Cardiovascular;  Laterality: N/A;  . POLYPECTOMY  10/16/2011   Procedure: POLYPECTOMY;  Surgeon: [name], MD;  Location: [facility] OR[facility];  Service: Endoscopy;;  Polypoid Lesion, Transverse and Sigmoid Colon    Current Medications: Outpatient Medications Prior to Visit  Medication Sig Dispense Refill  . albuterol (VENTOLIN HFA) 108 (90 Base) MCG/ACT inhaler Inhale 2 puffs into the lungs every 6 (six) hours as needed for wheezing or shortness of breath. 18 g 4  . ALPRAZolam (XANAX) 0.25 MG tablet Take 1 tablet (0.25 mg total) by mouth at bedtime as needed for anxiety or sleep (As needed for anxiety/insomnia/panic attack). 30 tablet 0  . aspirin EC 81 MG EC tablet Take 1 tablet (81 mg total) by mouth daily with breakfast. 30 tablet 3  . busPIRone (BUSPAR) 10 MG tablet Take 1 tablet (10 mg total) by mouth 3 (three) times daily. For anxiety 90 tablet 5  . glucose blood (FREESTYLE LITE) test strip TEST FOUR TIMES DAILY 100 each 3  . insulin regular (NOVOLIN R,HUMULIN R) 100 units/mL injection Inject 0.1 mLs (10 Units total) into the skin 3 (three) times daily before meals. 10 mL 2  . ipratropium-albuterol (DUONEB) 0.5-2.5 (3) MG/3ML SOLN Take 3 mLs by nebulization every 6 (six) hours as needed. 360 mL 1  . LANTUS SOLOSTAR 100 UNIT/ML Solostar Pen ADMINISTER 50 UNITS UNDER THE SKIN EVERY NIGHT AT BEDTIME 15 mL 2  . losartan (COZAAR) 25 MG tablet Take 1 tablet (25 mg total) by mouth daily. 30 tablet 5  . metoprolol succinate (TOPROL-XL) 50 MG 24 hr tablet TAKE 1 TABLET BY MOUTH EVERY DAY WITH FOOD 90 tablet 3  . mometasone-formoterol (DULERA) 100-5 MCG/ACT AERO Inhale 2 puffs into the lungs 2 (two) times daily. 13 g 5  . Multiple Vitamins-Minerals (MULTIVITAMIN WITH MINERALS) tablet Take 1 tablet by mouth daily. 120 tablet 2  . nitroGLYCERIN (NITROSTAT) 0.4 MG SL tablet Place 1 tablet (0.4 mg total) under the tongue every 5 (five)  minutes as needed for chest pain. 25 tablet 3  . potassium chloride SA (KLOR-CON) 20 MEQ tablet Take 1 tablet (20 mEq total) by mouth daily. 90 tablet 3  . rosuvastatin (CRESTOR) 40 MG tablet Take 1 tablet (40 mg total) by mouth daily. 30 tablet 3  . sertraline (ZOLOFT) 25 MG tablet Take 1 tablet (25 mg total) by mouth daily. For anxiety/panic attacks 30 tablet 11  . spironolactone (ALDACTONE) 25 MG tablet Take 1 tablet (25 mg total) by mouth daily. 90 tablet 1  . umeclidinium bromide (INCRUSE ELLIPTA) 62.5 MCG/INH AEPB Inhale 1 puff into the lungs daily. 30 each 5  . torsemide (DEMADEX) 20 MG tablet Take 1 tablet (20 mg total) by mouth See admin instructions. Take 2 tablets (40 mg) every Morning and 1 tablet (20 mg) every evening -Take Torsemide  in place of Lasix/furosemide 90 tablet 5  . acetaminophen (TYLENOL)  325 MG tablet Take 2 tablets (650 mg total) by mouth every 6 (six) hours as needed for mild pain, fever or headache (or Fever >/= 101). 12 tablet 1   No facility-administered medications prior to visit.     Allergies:   Entresto [sacubitril-valsartan] and Other   Social History   Socioeconomic History  . Marital status: Married    Spouse name: Not on file  . Number of children: Not on file  . Years of education: Not on file  . Highest education level: Not on file  Occupational History  . Occupation: Therapist, sports: [employer]  Tobacco Use  . Smoking status: Former Smoker    Packs/day: 0.25    Years: 30.00    Pack years: 7.50    Types: Cigars    Quit date: 05/23/2016    Years since quitting: 3.5  . Smokeless tobacco: Never Used  . Tobacco comment: since early 53s  Substance and Sexual Activity  . Alcohol use: Yes    Alcohol/week: 0.0 standard drinks    Comment: occ  . Drug use: No  . Sexual activity: Not Currently  Other Topics Concern  . Not on file  Social History Narrative  . Not on file   Social Determinants of Health   Financial Resource  Strain:   . Difficulty of Paying Living Expenses: Not on file  Food Insecurity:   . Worried About Charity fundraiser in the Last Year: Not on file  . Ran Out of Food in the Last Year: Not on file  Transportation Needs:   . Lack of Transportation (Medical): Not on file  . Lack of Transportation (Non-Medical): Not on file  Physical Activity:   . Days of Exercise per Week: Not on file  . Minutes of Exercise per Session: Not on file  Stress:   . Feeling of Stress : Not on file  Social Connections:   . Frequency of Communication with Friends and Family: Not on file  . Frequency of Social Gatherings with Friends and Family: Not on file  . Attends Religious Services: Not on file  . Active Member of Clubs or Organizations: Not on file  . Attends Archivist Meetings: Not on file  . Marital Status: Not on file     Family History:  The patient's family history includes Asthma in his sister; COPD in his father and mother; Diabetes in his mother; Emphysema in his father and mother; Heart attack in his mother; Heart disease in his mother; Hypertension in his mother; Stroke in his sister.   Review of Systems:   Please see the history of present illness.     General:  No chills, fever, night sweats or weight changes.  Cardiovascular:  No chest pain, edema, orthopnea, palpitations, paroxysmal nocturnal dyspnea. Positive for dyspnea on exertion (improved).  Dermatological: No rash, lesions/masses Respiratory: No cough, dyspnea Urologic: No hematuria, dysuria Abdominal:   No nausea, vomiting, diarrhea, bright red blood per rectum, melena, or hematemesis Neurologic:  No visual changes, wkns, changes in mental status. All other systems reviewed and are otherwise negative except as noted above.   Physical Exam:    VS:  BP 113/73   Pulse 76   Temp 97.7 F (36.5 C) (Temporal)   Ht 5\' 10"  (1.778 m)   Wt 201 lb (91.2 kg)   SpO2 97%   BMI 28.84 kg/m    General: Well developed, well  nourished,male appearing in no acute distress. Head:  Normocephalic, atraumatic, sclera non-icteric, no xanthomas, nares are without discharge.  Neck: No carotid bruits. JVD not elevated.  Lungs: Respirations regular and unlabored, without wheezes or rales.  Heart: Regular rate and rhythm. No S3 or S4.  No murmur, no rubs, or gallops appreciated. Abdomen: Soft, non-tender, non-distended with normoactive bowel sounds. No hepatomegaly. No rebound/guarding. No obvious abdominal masses. Msk:  Strength and tone appear normal for age. No joint deformities or effusions. Extremities: No clubbing or cyanosis. No lower extremity edema.  Distal pedal pulses are 2+ bilaterally. Neuro: Alert and oriented X 3. Moves all extremities spontaneously. No focal deficits noted. Psych:  Responds to questions appropriately with a normal affect. Skin: No rashes or lesions noted  Wt Readings from Last 3 Encounters:  11/24/19 201 lb (91.2 kg)  11/20/19 204 lb 12.9 oz (92.9 kg)  11/14/19 200 lb 13.4 oz (91.1 kg)     Studies/Labs Reviewed:   EKG:  EKG is not ordered today.    Recent Labs: 10/23/2019: ALT 23 11/13/2019: TSH 1.333 11/18/2019: B Natriuretic Peptide 463.0; Magnesium 2.4 11/19/2019: Hemoglobin 13.8; Platelets 224 11/24/2019: BUN 39; Creatinine, Ser 1.84; Potassium 4.3; Sodium 137   Lipid Panel    Component Value Date/Time   CHOL 161 10/23/2019 0955   TRIG 289 (H) 10/23/2019 0955   HDL 41 10/23/2019 0955   CHOLHDL 3.9 10/23/2019 0955   VLDL 27 02/09/2017 1201   LDLCALC 82 10/23/2019 0955   LDLDIRECT TEST NOT PERFORMED 06/19/2011 1230    Additional studies/ records that were reviewed today include:   Echocardiogram: 11/13/2019 IMPRESSIONS    1. Left ventricular ejection fraction, by visual estimation, is 25%. The left ventricle has severely decreased function. There is mildly increased left ventricular hypertrophy.  2. Multiple segmental abnormalities exist. See findings.  3. Left  ventricular diastolic parameters are indeterminate.  4. Moderately dilated left ventricular internal cavity size.  5. Global right ventricle has normal systolic function.The right ventricular size is normal. No increase in right ventricular wall thickness.  6. Left atrial size was normal.  7. Right atrial size was normal.  8. Presence of pericardial fat pad.  9. Mild to moderate aortic valve annular calcification. 10. The mitral valve is grossly normal. Mild mitral valve regurgitation. 11. The tricuspid valve is grossly normal. Tricuspid valve regurgitation is trivial. 12. The aortic valve is tricuspid. Aortic valve regurgitation is trivial. 13. The pulmonic valve was grossly normal. Pulmonic valve regurgitation is not visualized. 14. TR signal is inadequate for assessing pulmonary artery systolic pressure. 15. A pacer wire is visualized. 16. The inferior vena cava is normal in size with greater than 50% respiratory variability, suggesting right atrial pressure of 3 mmHg.  Assessment:    1. Chronic systolic (congestive) heart failure ([place])   2. ICD (implantable cardioverter-defibrillator), single, in situ   3. Medication management   4. Coronary artery disease involving native coronary artery of native heart without angina pectoris   5. Essential hypertension   6. Hyperlipidemia LDL goal <70   7. CKD (chronic kidney disease) stage 2, GFR 60-89 ml/min      Plan:   In order of problems listed above:  1. Chronic Systolic CHF - He has a known reduced EF of 25% by most recent echocardiogram last month. His breathing has overall been stable since hospital discharge and he appears euvolemic by examination today. He was transitioned from Lasix to Torsemide 40mg  in AM/20mg  in PM during his recent admission. Will recheck a BMET today. If renal function  has worsened since hospital discharge, would plan to reduce Torsemide dosing. He was previously consuming 4 shots of liquor daily and denies any  recurrent use since his admission. Was congratulated on this with continued cessation advised. - Continue Losartan 25 mg daily, Toprol-XL 50 mg daily and Spironolactone 25 mg daily. He was previously intolerant to [facility].  ADDENDUM: BMET results show creatinine has trended upwards from 1.21 at the time of hospital discharge to 1.84 today. Will reduce Torsemide to 40mg  daily and repeat BMET in 1 week.   2. Ischemic Cardiomyopathy  - he is s/p ICD placement which is followed by Dr. Lovena Le. Most recent device interrogation in 10/2019 showed normal device function with episodes of NSVT noted.  3. CAD -  He is s/p STEMI in 2013 with DES to LCx, patent by repeat cath in 05/2016 with moderate residual CAD along RCA and LAD. -He denies any recent chest pain and his dyspnea has returned to baseline.  - continue ASA, BB and statin therapy.    4. HTN - BP is well controlled at 113/73 during today's visit.  Continue current medication regimen at this time.  5. HLD - Followed by PCP. Most recent [place] in 10/2019 showed LDL elevated at 82. He remains on Crestor 40 mg daily.  6. Stage 2-3 CKD - baseline creatinine 1.3 - 1.4. Improved to 1.21 at the time of recent hospital discharge. Recheck BMET today.    Medication Adjustments/Labs and Tests Ordered: Current medicines are reviewed at length with the patient today.  Concerns regarding medicines are outlined above.  Medication changes, Labs and Tests ordered today are listed in the Patient Instructions below. Patient Instructions   Two Gram Sodium Diet 2000 mg  What is Sodium? Sodium is a mineral found naturally in many foods. The most significant source of sodium in the diet is table salt, which is about 40% sodium.  Processed, convenience, and preserved foods also contain a large amount of sodium.  The body needs only 500 mg of sodium daily to function,  A normal diet provides more than enough sodium even if you do not use salt.  Why Limit Sodium? A  build up of sodium in the body can cause thirst, increased blood pressure, shortness of breath, and water retention.  Decreasing sodium in the diet can reduce edema and risk of heart attack or stroke associated with high blood pressure.  Keep in mind that there are many other factors involved in these health problems.  Heredity, obesity, lack of exercise, cigarette smoking, stress and what you eat all play a role.  General Guidelines:  Do not add salt at the table or in cooking.  One teaspoon of salt contains over 2 grams of sodium.  Read food labels  Avoid processed and convenience foods  Ask your dietitian before eating any foods not dicussed in the menu planning guidelines  Consult your physician if you wish to use a salt substitute or a sodium containing medication such as antacids.  Limit milk and milk products to 16 oz (2 cups) per day.  Shopping Hints:  READ LABELS!! "Dietetic" does not necessarily mean low sodium.  Salt and other sodium ingredients are often added to foods during processing.   Menu Planning Guidelines Food Group Choose More Often Avoid  Beverages (see also the milk group All fruit juices, low-sodium, salt-free vegetables juices, low-sodium carbonated beverages Regular vegetable or tomato juices, commercially softened water used for drinking or cooking  Breads and Cereals Enriched white, wheat,  rye and pumpernickel bread, hard rolls and dinner rolls; muffins, cornbread and waffles; most dry cereals, cooked cereal without added salt; unsalted crackers and breadsticks; low sodium or homemade bread crumbs Bread, rolls and crackers with salted tops; quick breads; instant hot cereals; pancakes; commercial bread stuffing; self-rising flower and biscuit mixes; regular bread crumbs or cracker crumbs  Desserts and Sweets Desserts and sweets mad with mild should be within allowance Instant pudding mixes and cake mixes  Fats Butter or margarine; vegetable oils; unsalted salad  dressings, regular salad dressings limited to 1 Tbs; light, sour and heavy cream Regular salad dressings containing bacon fat, bacon bits, and salt pork; snack dips made with instant soup mixes or processed cheese; salted nuts  Fruits Most fresh, frozen and canned fruits Fruits processed with salt or sodium-containing ingredient (some dried fruits are processed with sodium sulfites        Vegetables Fresh, frozen vegetables and low- sodium canned vegetables Regular canned vegetables, sauerkraut, pickled vegetables, and others prepared in brine; frozen vegetables in sauces; vegetables seasoned with ham, bacon or salt pork  Condiments, Sauces, Miscellaneous  Salt substitute with physician's approval; pepper, herbs, spices; vinegar, lemon or lime juice; hot pepper sauce; garlic powder, onion powder, low sodium soy sauce (1 Tbs.); low sodium condiments (ketchup, chili sauce, mustard) in limited amounts (1 tsp.) fresh ground horseradish; unsalted tortilla chips, pretzels, potato chips, popcorn, salsa (1/4 cup) Any seasoning made with salt including garlic salt, celery salt, onion salt, and seasoned salt; sea salt, rock salt, kosher salt; meat tenderizers; monosodium glutamate; mustard, regular soy sauce, barbecue, sauce, chili sauce, teriyaki sauce, steak sauce, Worcestershire sauce, and most flavored vinegars; canned gravy and mixes; regular condiments; salted snack foods, olives, picles, relish, horseradish sauce, catsup   Food preparation: Try these seasonings Meats:    Pork Sage, onion Serve with applesauce  Chicken Poultry seasoning, thyme, parsley Serve with cranberry sauce  Lamb Curry powder, rosemary, garlic, thyme Serve with mint sauce or jelly  Veal Marjoram, basil Serve with current jelly, cranberry sauce  Beef Pepper, bay leaf Serve with dry mustard, unsalted chive butter  Fish [place] leaf, dill Serve with unsalted lemon butter, unsalted parsley butter  Vegetables:    Asparagus Lemon juice    Broccoli Lemon juice   Carrots Mustard dressing parsley, mint, nutmeg, glazed with unsalted butter and sugar   Green beans Marjoram, lemon juice, nutmeg,dill seed   Tomatoes Basil, marjoram, onion   Spice /blend for [employer]"    4 tsp ground thyme 1 tsp ground sage 3 tsp ground rosemary 4 tsp ground marjoram      Signed, Erma Heritage, PA-C  11/24/2019 5:44 PM    [facility] [address] [place], [place] [address] Phone: [phone] Fax: [phone]

## 2019-11-24 NOTE — Patient Instructions (Signed)
Two Gram Sodium Diet 2000 mg  What is Sodium? Sodium is a mineral found naturally in many foods. The most significant source of sodium in the diet is table salt, which is about 40% sodium.  Processed, convenience, and preserved foods also contain a large amount of sodium.  The body needs only 500 mg of sodium daily to function,  A normal diet provides more than enough sodium even if you do not use salt.  Why Limit Sodium? A build up of sodium in the body can cause thirst, increased blood pressure, shortness of breath, and water retention.  Decreasing sodium in the diet can reduce edema and risk of heart attack or stroke associated with high blood pressure.  Keep in mind that there are many other factors involved in these health problems.  Heredity, obesity, lack of exercise, cigarette smoking, stress and what you eat all play a role.  General Guidelines:  Do not add salt at the table or in cooking.  One teaspoon of salt contains over 2 grams of sodium.  Read food labels  Avoid processed and convenience foods  Ask your dietitian before eating any foods not dicussed in the menu planning guidelines  Consult your physician if you wish to use a salt substitute or a sodium containing medication such as antacids.  Limit milk and milk products to 16 oz (2 cups) per day.  Shopping Hints:  READ LABELS!! "Dietetic" does not necessarily mean low sodium.  Salt and other sodium ingredients are often added to foods during processing.   Menu Planning Guidelines Food Group Choose More Often Avoid  Beverages (see also the milk group All fruit juices, low-sodium, salt-free vegetables juices, low-sodium carbonated beverages Regular vegetable or tomato juices, commercially softened water used for drinking or cooking  Breads and Cereals Enriched white, wheat, rye and pumpernickel bread, hard rolls and dinner rolls; muffins, cornbread and waffles; most dry cereals, cooked cereal without added salt; unsalted  crackers and breadsticks; low sodium or homemade bread crumbs Bread, rolls and crackers with salted tops; quick breads; instant hot cereals; pancakes; commercial bread stuffing; self-rising flower and biscuit mixes; regular bread crumbs or cracker crumbs  Desserts and Sweets Desserts and sweets mad with mild should be within allowance Instant pudding mixes and cake mixes  Fats Butter or margarine; vegetable oils; unsalted salad dressings, regular salad dressings limited to 1 Tbs; light, sour and heavy cream Regular salad dressings containing bacon fat, bacon bits, and salt pork; snack dips made with instant soup mixes or processed cheese; salted nuts  Fruits Most fresh, frozen and canned fruits Fruits processed with salt or sodium-containing ingredient (some dried fruits are processed with sodium sulfites        Vegetables Fresh, frozen vegetables and low- sodium canned vegetables Regular canned vegetables, sauerkraut, pickled vegetables, and others prepared in brine; frozen vegetables in sauces; vegetables seasoned with ham, bacon or salt pork  Condiments, Sauces, Miscellaneous  Salt substitute with physician's approval; pepper, herbs, spices; vinegar, lemon or lime juice; hot pepper sauce; garlic powder, onion powder, low sodium soy sauce (1 Tbs.); low sodium condiments (ketchup, chili sauce, mustard) in limited amounts (1 tsp.) fresh ground horseradish; unsalted tortilla chips, pretzels, potato chips, popcorn, salsa (1/4 cup) Any seasoning made with salt including garlic salt, celery salt, onion salt, and seasoned salt; sea salt, rock salt, kosher salt; meat tenderizers; monosodium glutamate; mustard, regular soy sauce, barbecue, sauce, chili sauce, teriyaki sauce, steak sauce, Worcestershire sauce, and most flavored vinegars; canned gravy and mixes;   regular condiments; salted snack foods, olives, picles, relish, horseradish sauce, catsup   Food preparation: Try these seasonings Meats:    Pork  Sage, onion Serve with applesauce  Chicken Poultry seasoning, thyme, parsley Serve with cranberry sauce  Lamb Curry powder, rosemary, garlic, thyme Serve with mint sauce or jelly  Veal Marjoram, basil Serve with current jelly, cranberry sauce  Beef Pepper, bay leaf Serve with dry mustard, unsalted chive butter  Fish [place] leaf, dill Serve with unsalted lemon butter, unsalted parsley butter  Vegetables:    Asparagus Lemon juice   Broccoli Lemon juice   Carrots Mustard dressing parsley, mint, nutmeg, glazed with unsalted butter and sugar   Green beans Marjoram, lemon juice, nutmeg,dill seed   Tomatoes Basil, marjoram, onion   Spice /blend for [employer]"    4 tsp ground thyme 1 tsp ground sage 3 tsp ground rosemary 4 tsp ground marjoram       Heart-Healthy Eating Plan Heart-healthy meal planning includes:  Eating less unhealthy fats.  Eating more healthy fats.  Making other changes in your diet. Talk with your doctor or a diet specialist (dietitian) to create an eating plan that is right for you.   What are tips for following this plan? Cooking Avoid frying your food. Try to bake, boil, grill, or broil it instead. You can also reduce fat by:  Removing the skin from poultry.  Removing all visible fats from meats.  Steaming vegetables in water or broth. Meal planning   At meals, divide your plate into four equal parts: ? Fill one-half of your plate with vegetables and green salads. ? Fill one-fourth of your plate with whole grains. ? Fill one-fourth of your plate with lean protein foods.  Eat 4-5 servings of vegetables per day. A serving of vegetables is: ? 1 cup of raw or cooked vegetables. ? 2 cups of raw leafy greens.  Eat 4-5 servings of fruit per day. A serving of fruit is: ? 1 medium whole fruit. ?  cup of dried fruit. ?  cup of fresh, frozen, or canned fruit. ?  cup of 100% fruit juice.  Eat more foods that have soluble fiber. These are apples,  broccoli, carrots, beans, peas, and barley. Try to get 20-30 g of fiber per day.  Eat 4-5 servings of nuts, legumes, and seeds per week: ? 1 serving of dried beans or legumes equals  cup after being cooked. ? 1 serving of nuts is  cup. ? 1 serving of seeds equals 1 tablespoon. General information  Eat more home-cooked food. Eat less restaurant, buffet, and fast food.  Limit or avoid alcohol.  Limit foods that are high in starch and sugar.  Avoid fried foods.  Lose weight if you are overweight.  Keep track of how much salt (sodium) you eat. This is important if you have high blood pressure. Ask your doctor to tell you more about this.  Try to add vegetarian meals each week. Fats  Choose healthy fats. These include olive oil and canola oil, flaxseeds, walnuts, almonds, and seeds.  Eat more omega-3 fats. These include salmon, mackerel, sardines, tuna, flaxseed oil, and ground flaxseeds. Try to eat fish at least 2 times each week.  Check food labels. Avoid foods with trans fats or high amounts of saturated fat.  Limit saturated fats. ? These are often found in animal products, such as meats, butter, and cream. ? These are also found in plant foods, such as palm oil, palm kernel oil, and  coconut oil.  Avoid foods with partially hydrogenated oils in them. These have trans fats. Examples are stick margarine, some tub margarines, cookies, crackers, and other baked goods. What foods can I eat? Fruits All fresh, canned (in natural juice), or frozen fruits. Vegetables Fresh or frozen vegetables (raw, steamed, roasted, or grilled). Green salads. Grains Most grains. Choose whole wheat and whole grains most of the time. Rice and pasta, including brown rice and pastas made with whole wheat. Meats and other proteins Lean, well-trimmed beef, veal, pork, and lamb. Chicken and [place] without skin. All fish and shellfish. Wild duck, rabbit, pheasant, and venison. Egg whites or low-cholesterol  egg substitutes. Dried beans, peas, lentils, and tofu. Seeds and most nuts. Dairy Low-fat or nonfat cheeses, including ricotta and mozzarella. Skim or 1% milk that is liquid, powdered, or evaporated. Buttermilk that is made with low-fat milk. Nonfat or low-fat yogurt. Fats and oils Non-hydrogenated (trans-free) margarines. Vegetable oils, including soybean, sesame, sunflower, olive, peanut, safflower, corn, canola, and cottonseed. Salad dressings or mayonnaise made with a vegetable oil. Beverages Mineral water. Coffee and tea. Diet carbonated beverages. Sweets and desserts Sherbet, gelatin, and fruit ice. Small amounts of dark chocolate. Limit all sweets and desserts. Seasonings and condiments All seasonings and condiments. The items listed above may not be a complete list of foods and drinks you can eat. Contact a dietitian for more options. What foods should I avoid? Fruits Canned fruit in heavy syrup. Fruit in cream or butter sauce. Fried fruit. Limit coconut. Vegetables Vegetables cooked in cheese, cream, or butter sauce. Fried vegetables. Grains Breads that are made with saturated or trans fats, oils, or whole milk. Croissants. Sweet rolls. Donuts. High-fat crackers, such as cheese crackers. Meats and other proteins Fatty meats, such as hot dogs, ribs, sausage, bacon, rib-eye roast or steak. High-fat deli meats, such as salami and bologna. Caviar. Domestic duck and goose. Organ meats, such as liver. Dairy Cream, sour cream, cream cheese, and creamed cottage cheese. Whole-milk cheeses. Whole or 2% milk that is liquid, evaporated, or condensed. Whole buttermilk. Cream sauce or high-fat cheese sauce. Yogurt that is made from whole milk. Fats and oils Meat fat, or shortening. Cocoa butter, hydrogenated oils, palm oil, coconut oil, palm kernel oil. Solid fats and shortenings, including bacon fat, salt pork, lard, and butter. Nondairy cream substitutes. Salad dressings with cheese or sour  cream. Beverages Regular sodas and juice drinks with added sugar. Sweets and desserts Frosting. Pudding. Cookies. Cakes. Pies. Milk chocolate or white chocolate. Buttered syrups. Full-fat ice cream or ice cream drinks. The items listed above may not be a complete list of foods and drinks to avoid. Contact a dietitian for more information. Summary  Heart-healthy meal planning includes eating less unhealthy fats, eating more healthy fats, and making other changes in your diet.  Eat a balanced diet. This includes fruits and vegetables, low-fat or nonfat dairy, lean protein, nuts and legumes, whole grains, and heart-healthy oils and fats. This information is not intended to replace advice given to you by your health care provider. Make sure you discuss any questions you have with your health care provider. Document Released: 05/31/2012 Document Revised: 02/03/2018 Document Reviewed: 01/07/2018 Elsevier Patient Education  2020 [employer].

## 2019-11-24 NOTE — Telephone Encounter (Signed)
-----   Message from Erma Heritage, [place] sent at 11/24/2019  3:34 PM EST ----- Please let the patient know his kidney function has worsened since his hospitalization and I suspect this is secondary to his increased Torsemide dosing.  Please reduce Torsemide from 40mg  in AM/20mg  in PM to 40mg  once daily. Continue K+ supplementation for now. Recheck BMET again in 1 week as we may need to further reduce Torsemide.

## 2019-11-24 NOTE — Telephone Encounter (Signed)
I spoke with patient. He will decrease torsemide to 40 mg daily and repeat BMET in 1 week. He will stop by office to pick up lab slip next week.

## 2019-11-30 ENCOUNTER — Encounter: Payer: Self-pay | Admitting: Family Medicine

## 2019-11-30 ENCOUNTER — Telehealth: Payer: Self-pay

## 2019-11-30 ENCOUNTER — Ambulatory Visit: Payer: BC Managed Care – PPO | Admitting: Family Medicine

## 2019-11-30 ENCOUNTER — Other Ambulatory Visit: Payer: Self-pay

## 2019-11-30 VITALS — BP 118/68 | HR 81 | Temp 98.1°F | Resp 15 | Ht 70.0 in | Wt 203.0 lb

## 2019-11-30 DIAGNOSIS — F410 Panic disorder [episodic paroxysmal anxiety] without agoraphobia: Secondary | ICD-10-CM

## 2019-11-30 DIAGNOSIS — I5022 Chronic systolic (congestive) heart failure: Secondary | ICD-10-CM

## 2019-11-30 DIAGNOSIS — Z7689 Persons encountering health services in other specified circumstances: Secondary | ICD-10-CM | POA: Diagnosis not present

## 2019-11-30 DIAGNOSIS — I10 Essential (primary) hypertension: Secondary | ICD-10-CM

## 2019-11-30 DIAGNOSIS — N179 Acute kidney failure, unspecified: Secondary | ICD-10-CM

## 2019-11-30 DIAGNOSIS — Z6829 Body mass index (BMI) 29.0-29.9, adult: Secondary | ICD-10-CM

## 2019-11-30 DIAGNOSIS — E663 Overweight: Secondary | ICD-10-CM | POA: Diagnosis not present

## 2019-11-30 DIAGNOSIS — F1010 Alcohol abuse, uncomplicated: Secondary | ICD-10-CM

## 2019-11-30 NOTE — Patient Instructions (Addendum)
  I appreciate the opportunity to provide you with care for your health and wellness. Today we discussed:  Recent hospital stay  Follow up: 01/14/2019 as scheduled  Lab today   Take zoloft daily-call if you have problems with this.  Recommendations post discharge included: 1)Very low-salt diet advised 2)Weigh yourself daily, call if you gain more than 3 pounds in 1 day or more than 5 pounds in 1 week as your diuretic medications may need to be adjusted 3)Limit your Fluid  intake to no more than 60 ounces (1.8 Liters) per day 4)Complete abstinence from tobacco and alcohol advised 6)Take medications as prescribed. 8) Continue to avoid ibuprofen/Advil/Aleve/Motrin/Goody Powders/Naproxen/BC powders/Meloxicam/Diclofenac/Indomethacin and other Nonsteroidal anti-inflammatory medications as these will make you more likely to bleed and can cause stomach ulcers, can also cause Kidney problems.  9) Again complete abstinence from ALCOHOL strongly advised  I hope you have a wonderful, happy, safe, and healthy Holiday Season! See you in the New Year :)  Please continue to practice social distancing to keep you, your family, and our community safe.  If you must go out, please wear a mask and practice good handwashing.  It was a pleasure to see you and I look forward to continuing to work together on your health and well-being. Please do not hesitate to call the office if you need care or have questions about your care.  Have a wonderful day and week. With Gratitude, Cherly Beach, DNP, AGNP-BC

## 2019-11-30 NOTE — Assessment & Plan Note (Addendum)
He is encouraged to restart the Zoloft daily.  He does not need to use the buspirone if he does not feel like he is having panic attacks.  He is in agreements with starting the Zoloft to see if that helps.  Has not had any panic attacks since being in the hospital.  Possibly related to fluid overload and doing much better now.  I spoke with him at length about use of Xanax at night.  In addition the fact that he has been doubling the dose.  And that is not a very good medication to use for sleep needs.  He is in understanding of this.  But message was sent to Dr. Moshe Cipro to see if she would want to do a trial basis of this versus something else to treat his inability to sleep.  As per his request

## 2019-11-30 NOTE — Telephone Encounter (Signed)
Patient was started on Alprazolam 0.25mg  qhs in the hospital. He stated during his appointment that one didn't do anything but when he takes 2 tablets he sleeps like a baby. He is asking for more medication. He was also started on Buspar and Zoloft. He stopped both. He stated the Buspar made him for out of it and like a robot. It isn't the daytime he needs help with, it is sleeping he needs help with and the Alprazolam 2 tablets of the 0.25mg  qhs make him sleep like a baby. Advised patient I would send this to the provider with verbal understanding.

## 2019-11-30 NOTE — Assessment & Plan Note (Signed)
Antonio Cobb is encouraged to maintain a well balanced diet that is low in salt. Controlled, continue current medication regimen. No refills needed Additionally, he is also reminded that exercise is beneficial for heart health and control of  Blood pressure. 30-60 minutes daily is recommended-walking was suggested.

## 2019-11-30 NOTE — Assessment & Plan Note (Signed)
Antonio Cobb is re-educated about the importance of exercise daily to help with weight management.A minumum of 30 minutes daily is recommended. Additionally, importance of healthy food choices with portion control discussed.  Again strongly encouraged to make sure that he is compliant with low-salt diet, low-fat diet and exercise.

## 2019-11-30 NOTE — Assessment & Plan Note (Signed)
As documented above.  Will be getting check of electrolytes and CMP with GFR.  Check her kidney function.  Adjustment of medications as needed.  Strongly recommended ongoing abstinence from alcohol.  Suggested restarting the Zoloft as previously discussed with hospital provider.  In addition reinforced weight checks and low-salt diet.  In addition to exercise daily.

## 2019-11-30 NOTE — Telephone Encounter (Signed)
Will not change discharge meds at this time Seems like he will benefit from Psych management based on report provided, so I recommend that, otherwise needs to stay  on dose discharged on,  He may make appt to see me mid to end January if still a concern and has not been seen by Psych

## 2019-11-30 NOTE — Assessment & Plan Note (Signed)
Acute kidney injury noted while in the hospital.  Encouraged to avoid all NSAID and inflammatory medications that can cause kidney problems.  We will be rechecking BMP today.  Adjustment of medications as needed will be addressed appropriate.

## 2019-11-30 NOTE — Progress Notes (Signed)
Subjective:  Patient ID: Antonio Cobb, male    DOB: 02-21-1961  Age: 58 y.o. MRN: [id number]  CC:  Chief Complaint  Patient presents with  . Transitions Of Care      HPI  HPI  Antonio A Cummingsis a 58 y.o.malewith medical history significant ofalcohol abuse, coronary artery disease status post STEMI with DES to left circumflex in 0000000, systolic heart failure with ejection fraction 25 to 30% and is status post ICD, COPD, type 2 diabetes mellitus poorly controlled and insulin-dependent, essential hypertension, hyperlipidemia, chronic kidney disease a stage IIIa. He presented to the ER on 11/18/2019 with worsening shortness of breath, dyspnea on exertion, diaphoresis.He had gotten up to go to the bathroom and experienced loss of breath and respiratory distress prior to arrival. He denied any chest pain or palpitations. Has not had any cough, fever, chills, abdominal pain, nausea, vomiting, increased lower extremity swelling. Reported some orthopnea at baseline. Denies any dietary noncompliance. Reports taking all medication and says he has not missed any doses. Thought he reports he stopped zoloft and buspar due to feeling like out of sorts, unsure which caused it. Reports increasing his xanax to 0.50mg  to sleep at night-even though dose is 0.25mg . Reports abstaining from alcohol since admission.    ED Course: Vital Signs reviewed on presentation, significant fortemperature 98.3, respirate 22, heart rate 103, blood pressure 132/92, saturation 100% on BiPAP. Labs reviewed, significant forsodium 139, potassium 3.4, chloride 102, BUN 25, creatinine 1.58, BNP 463, troponin 30, WBC count 17.2, hemoglobin 16.1, hematocrit 52, platelets 258, SARS Covid RT-PCR has been sent. Imaging personally Reviewed,chest x-ray shows central pulmonary vascular congestion with pulmonary edema. EKG personally reviewed, showssinus tachycardia, no acute ST-T changes.  He was admitted with  hypokalemia, acute pulmonary edema, renal insufficiency, elevated troponin, dyspnea with respiratory abnormalities.  Acute on chronic combined systolic and diastolic congestive heart failure. He was down in weight by 9 pounds at discharge.  He is down another pound today in the office.  Reports taking his furosemide as ordered.  And is avoiding salt as much as possible.  Reports that he is breathing better and does not have any issues like he was having in the hospital.  He does still have some baseline dyspnea with communication.  Oxygenation is stable today at 97% on room air.  Reports taking his medications for blood sugar control and reports that these have done better.  Recommendations post discharge included: Very low-salt diet advised Weigh yourself daily, call if you gain more than 3 pounds in 1 day or more than 5 pounds in 1 week as your diuretic medications may need to be adjusted Limit your Fluid  intake to no more than 60 ounces (1.8 Liters) per day Complete abstinence from tobacco and alcohol advised Recheck BMP/kidney and electrolyte blood test with your primary care physician within a week Take Torsemide  in place of Lasix/furosemide (STOP Lasix/furosemide and START  Torsemide as prescribed instead) Avoid ibuprofen/Advil/Aleve/Motrin/Goody Powders/Naproxen/BC powders/Meloxicam/Diclofenac/Indomethacin and other Nonsteroidal anti-inflammatory medications as these will make you more likely to bleed and can cause stomach ulcers, can also cause Kidney problems.   again complete abstinence from ALCOHOL strongly advised  Today patient denies signs and symptoms of COVID 19 infection including fever, chills, cough, shortness of breath, and headache. Past Medical, Surgical, Social History, Allergies, and Medications have been Reviewed.   Past Medical History:  Diagnosis Date  . Alcohol abuse   . CAD (coronary artery disease)    a.  s/p inferolateral STEMI in 05/2012 with DES to LCx b. patent  stent by cath in 05/2016 with moderate RCA and LAD disease  . Cardiomyopathy ([place])    a. LVEF 30-35% by echo in 09/2016. b. 08/2017: echo showing EF remains reduced at 25-30% c. s/p [employer] ICD placement in 01/2018  . CHF (congestive heart failure) ([place])   . COPD (chronic obstructive pulmonary disease) ([place])   . Diabetes mellitus, type 2 ([place])   . Essential hypertension   . Mitral regurgitation    Moderate  . Mixed hyperlipidemia   . Myocardial infarction ([place]) 05/23/2012   Inferolateral STEMI  . Obesity     Current Meds  Medication Sig  . albuterol (VENTOLIN HFA) 108 (90 Base) MCG/ACT inhaler Inhale 2 puffs into the lungs every 6 (six) hours as needed for wheezing or shortness of breath.  . ALPRAZolam (XANAX) 0.25 MG tablet Take 1 tablet (0.25 mg total) by mouth at bedtime as needed for anxiety or sleep (As needed for anxiety/insomnia/panic attack).  [name] aspirin EC 81 MG EC tablet Take 1 tablet (81 mg total) by mouth daily with breakfast.  . glucose blood (FREESTYLE LITE) test strip TEST FOUR TIMES DAILY  . insulin regular (NOVOLIN R,HUMULIN R) 100 units/mL injection Inject 0.1 mLs (10 Units total) into the skin 3 (three) times daily before meals.  [name] ipratropium-albuterol (DUONEB) 0.5-2.5 (3) MG/3ML SOLN Take 3 mLs by nebulization every 6 (six) hours as needed.  [name] LANTUS SOLOSTAR 100 UNIT/ML Solostar Pen ADMINISTER 50 UNITS UNDER THE SKIN EVERY NIGHT AT BEDTIME  . losartan (COZAAR) 25 MG tablet Take 1 tablet (25 mg total) by mouth daily.  . metoprolol succinate (TOPROL-XL) 50 MG 24 hr tablet TAKE 1 TABLET BY MOUTH EVERY DAY WITH FOOD  . mometasone-formoterol (DULERA) 100-5 MCG/ACT AERO Inhale 2 puffs into the lungs 2 (two) times daily.  . Multiple Vitamins-Minerals (MULTIVITAMIN WITH MINERALS) tablet Take 1 tablet by mouth daily.  . potassium chloride SA (KLOR-CON) 20 MEQ tablet Take 1 tablet (20 mEq total) by mouth daily.  . rosuvastatin (CRESTOR) 40 MG tablet Take 1 tablet (40  mg total) by mouth daily.  [name] spironolactone (ALDACTONE) 25 MG tablet Take 1 tablet (25 mg total) by mouth daily.  [name] torsemide (DEMADEX) 20 MG tablet Take 2 tablets (40 mg total) by mouth daily.  [name] umeclidinium bromide (INCRUSE ELLIPTA) 62.5 MCG/INH AEPB Inhale 1 puff into the lungs daily.    ROS:  Review of Systems  Constitutional: Negative.   HENT: Negative.   Eyes: Negative.   Gastrointestinal: Negative.   Genitourinary: Negative.   Musculoskeletal: Negative.   Skin: Negative.   Endo/Heme/Allergies: Negative.   Psychiatric/Behavioral: Negative.   All other systems reviewed and are negative.    Objective:   Today's Vitals: BP 118/68   Pulse 81   Temp 98.1 F (36.7 C) (Oral)   Resp 15   Ht 5\' 10"  (1.778 m)   Wt 203 lb (92.1 kg)   SpO2 97%   BMI 29.13 kg/m  Vitals with BMI 11/30/2019 11/24/2019 11/20/2019  Height 5\' 10"  5\' 10"  -  Weight 203 lbs 201 lbs 204 lbs 13 oz  BMI 29.13 [id number]  Systolic [id number] 0000000  Diastolic 68 73 95  Pulse 81 76 88     Physical Exam Vitals and nursing note reviewed.  Constitutional:      Appearance: Normal appearance. He is well-developed, well-groomed and overweight.  HENT:     Head: Normocephalic and atraumatic.  Right Ear: External ear normal.     Left Ear: External ear normal.     Nose: Nose normal.     Mouth/Throat:     Mouth: Mucous membranes are moist.     Pharynx: Oropharynx is clear.  Eyes:     General:        Right eye: No discharge.        Left eye: No discharge.     Conjunctiva/sclera: Conjunctivae normal.  Cardiovascular:     Rate and Rhythm: Normal rate and regular rhythm.     Pulses: Normal pulses.     Heart sounds: Normal heart sounds.  Pulmonary:     Effort: Pulmonary effort is normal.     Breath sounds: Normal breath sounds.  Musculoskeletal:        General: Normal range of motion.     Cervical back: Normal range of motion and neck supple.  Skin:    General: Skin is warm.  Neurological:      General: No focal deficit present.     Mental Status: He is alert and oriented to person, place, and time.  Psychiatric:        Attention and Perception: Attention and perception normal.        Mood and Affect: Mood and affect normal.        Speech: Speech normal.        Behavior: Behavior normal. Behavior is cooperative.        Thought Content: Thought content normal.        Cognition and Memory: Cognition normal.        Judgment: Judgment normal.     Assessment   1. Encounter for support and coordination of transition of care   2. Chronic systolic (congestive) heart failure ([place])   3. Overweight with body mass index (BMI) of 29 to 29.9 in adult   4. Essential hypertension   5. Anxiety attack/panic attacks   6. Alcohol abuse   7. AKI (acute kidney injury) ([place])     Tests ordered Orders Placed This Encounter  Procedures  . COMPLETE METABOLIC PANEL WITH GFR     Plan: Please see assessment and plan per problem list above.   Patient to follow-up in 01/15/2020   Perlie Mayo, NP

## 2019-11-30 NOTE — Assessment & Plan Note (Signed)
Working on becoming stable. Continue current medications and monitor of wt daily. Advised to be on a very strict low salt diet and avoid alcohol. Weigh yourself daily, call if you gain more than 3 pounds in 1 day or more than 5 pounds in 1 week as your diuretic medications may need to be adjusted Limit your Fluid  intake to no more than 60 ounces (1.8 Liters) per day Complete abstinence from tobacco. Patient acknowledged agreement and understanding of the plan.

## 2019-12-01 LAB — COMPLETE METABOLIC PANEL WITH GFR
AG Ratio: 1.3 (calc) (ref 1.0–2.5)
ALT: 26 U/L (ref 9–46)
AST: 24 U/L (ref 10–35)
Albumin: 4.1 g/dL (ref 3.6–5.1)
Alkaline phosphatase (APISO): 86 U/L (ref 35–144)
BUN/Creatinine Ratio: 21 (calc) (ref 6–22)
BUN: 39 mg/dL — ABNORMAL HIGH (ref 7–25)
CO2: 26 mmol/L (ref 20–32)
Calcium: 9.6 mg/dL (ref 8.6–10.3)
Chloride: 101 mmol/L (ref 98–110)
Creat: 1.85 mg/dL — ABNORMAL HIGH (ref 0.70–1.33)
GFR, Est African American: 45 mL/min/{1.73_m2} — ABNORMAL LOW (ref >=60)
GFR, Est Non African American: 39 mL/min/{1.73_m2} — ABNORMAL LOW (ref >=60)
Globulin: 3.2 g/dL (calc) (ref 1.9–3.7)
Glucose, Bld: 154 mg/dL — ABNORMAL HIGH (ref 65–139)
Potassium: 4.3 mmol/L (ref 3.5–5.3)
Sodium: 138 mmol/L (ref 135–146)
Total Bilirubin: 0.7 mg/dL (ref 0.2–1.2)
Total Protein: 7.3 g/dL (ref 6.1–8.1)

## 2019-12-01 NOTE — Telephone Encounter (Signed)
Called to advised patient of providers response. No answer, vm full and unable to leave message. Will try again later.

## 2019-12-04 ENCOUNTER — Other Ambulatory Visit: Payer: Self-pay | Admitting: Family Medicine

## 2019-12-04 DIAGNOSIS — N1831 Chronic kidney disease, stage 3a: Secondary | ICD-10-CM

## 2019-12-04 DIAGNOSIS — I5022 Chronic systolic (congestive) heart failure: Secondary | ICD-10-CM

## 2019-12-04 MED ORDER — HYDROXYZINE HCL 25 MG PO TABS: ORAL_TABLET | ORAL | 3 refills | Status: DC

## 2019-12-04 NOTE — Telephone Encounter (Signed)
Advised patient of your response. He states he has used all of the Alprazolam. He is asking for a refill. Or if you have any recommendations of something else he can take at night to help him sleep prescription or otc. Please advise.

## 2019-12-04 NOTE — Telephone Encounter (Signed)
Hydroxyzine for bedtime use is prescribed, pls let him know

## 2019-12-05 NOTE — Telephone Encounter (Signed)
Left a message (per patient ok) with new prescription info. Encouraged patient to call with any questions or concerns he may have regarding the new prescription.

## 2019-12-06 NOTE — Progress Notes (Signed)
ICD remote 

## 2019-12-19 ENCOUNTER — Other Ambulatory Visit: Payer: Self-pay | Admitting: "Endocrinology

## 2019-12-26 ENCOUNTER — Telehealth: Payer: Self-pay

## 2019-12-26 NOTE — Telephone Encounter (Signed)
FMLA Copied Sleeved Epic

## 2020-01-10 ENCOUNTER — Other Ambulatory Visit: Payer: Self-pay | Admitting: Family Medicine

## 2020-01-15 ENCOUNTER — Other Ambulatory Visit: Payer: Self-pay

## 2020-01-15 ENCOUNTER — Ambulatory Visit (INDEPENDENT_AMBULATORY_CARE_PROVIDER_SITE_OTHER): Payer: BC Managed Care – PPO | Admitting: Family Medicine

## 2020-01-15 VITALS — BP 118/68 | Ht 70.0 in | Wt 205.0 lb

## 2020-01-15 DIAGNOSIS — E1159 Type 2 diabetes mellitus with other circulatory complications: Secondary | ICD-10-CM

## 2020-01-15 DIAGNOSIS — I5022 Chronic systolic (congestive) heart failure: Secondary | ICD-10-CM | POA: Diagnosis not present

## 2020-01-15 DIAGNOSIS — E782 Mixed hyperlipidemia: Secondary | ICD-10-CM | POA: Diagnosis not present

## 2020-01-15 DIAGNOSIS — F1010 Alcohol abuse, uncomplicated: Secondary | ICD-10-CM

## 2020-01-15 DIAGNOSIS — I10 Essential (primary) hypertension: Secondary | ICD-10-CM | POA: Diagnosis not present

## 2020-01-15 NOTE — Progress Notes (Signed)
Virtual Visit via Telephone Note  I connected with Antonio Cobb on 01/15/20 at  3:20 PM EST by telephone and verified that I am speaking with the correct person using two identifiers.  Location: Patient: home Provider: office   I discussed the limitations, risks, security and privacy concerns of performing an evaluation and management service by telephone and the availability of in person appointments. I also discussed with the patient that there may be a patient responsible charge related to this service. The patient expressed understanding and agreed to proceed.   History of Present Illness: F/U chronic problems, medication review, and refill medication when necessary. Review most recent labs and order labs which are due Review preventive health and update with necessary referrals or immunizations as indicated. Reports abstaining from alcohol, and reports limiting fluid intake, states he feels well Not testing blood sugar regularly, when he does ,  generally above 200 Denies polyuria, polydipsia, blurred vision , or hypoglycemic episodes.  Denies recent fever or chills. Denies sinus pressure, nasal congestion, ear pain or sore throat. Denies chest congestion, productive cough or wheezing. Denies chest pains, palpitations and leg swelling Denies abdominal pain, nausea, vomiting,diarrhea or constipation.   Denies dysuria, frequency, hesitancy or incontinence. Denies joint pain, swelling and limitation in mobility. Denies headaches, seizures, numbness, or tingling. Denies depression, anxiety or insomnia. Denies skin break down or rash.    Denies polyuria, polydipsia, blurred vision , or hypoglycemic episodes.    Observations/Objective: BP 118/68   Ht 5\' 10"  (1.778 m)   Wt 205 lb (93 kg)   BMI 29.41 kg/m  Good communication with no confusion and intact memory. Alert and oriented x 3 No signs of respiratory distress during speech    Assessment and Plan: Essential  hypertension Controlled, no change in medication   Chronic systolic (congestive) heart failure (HCC) Denies symptoms of decompensation, re educated regarding control  DM type 2 causing vascular disease ([place]) Uncontrolled, managed by endo, increased compliance with treatment plan encouraged Updated lab needed at/ before next visit.   Mixed hyperlipidemia Hyperlipidemia:Low fat diet discussed and encouraged.   Lipid Panel  Lab Results  Component Value Date   CHOL 161 10/23/2019   HDL 41 10/23/2019   LDLCALC 82 10/23/2019   LDLDIRECT TEST NOT PERFORMED 06/19/2011   TRIG 289 (H) 10/23/2019   CHOLHDL 3.9 10/23/2019  uncontrolled, needs to reduce fat intake     Alcohol abuse Reports abstinence, advised and encouraged to remain [facility] free due to severe heart disease and uncontrolled diabetes    Follow Up Instructions:    I discussed the assessment and treatment plan with the patient. The patient was provided an opportunity to ask questions and all were answered. The patient agreed with the plan and demonstrated an understanding of the instructions.   The patient was advised to call back or seek an in-person evaluation if the symptoms worsen or if the condition fails to improve as anticipated.  I provided 22 minutes of non-face-to-face time during this encounter.   Tula Nakayama, MD

## 2020-01-15 NOTE — Patient Instructions (Addendum)
F/U in office with MD for foot exam, re evaluate chronic problems  Please get fasting lipid, cmp and EGFr , hBa1C Feb 9 or shortly after ([facility])  CONTINUE to ABSTAIN from ALL alcohol including beer, to protect your heart  LIMIT fluid to 60 ounces daily  Weigh daily, a 2 pound weight gain overnight is too much, and will let you know that you need to watch heart health closely  Thanks for choosing [facility], we consider it a privelige to serve you. It is important that you exercise regularly at least 30 minutes 5 times a week. If you develop chest pain, have severe difficulty breathing, or feel very tired, stop exercising immediately and seek medical attention  Think about what you will eat, plan ahead. Choose " clean, green, fresh or frozen" over canned, processed or packaged foods which are more sugary, salty and fatty. 70 to 75% of food eaten should be vegetables and fruit. Three meals at set times with snacks allowed between meals, but they must be fruit or vegetables. Aim to eat over a 12 hour period , example 7 am to 7 pm, and STOP after  your last meal of the day. Drink water,generally about 64 ounces per day, no other drink is as healthy. Fruit juice is best enjoyed in a healthy way, by EATING the fruit.

## 2020-01-17 ENCOUNTER — Emergency Department (HOSPITAL_COMMUNITY): Payer: BC Managed Care – PPO

## 2020-01-17 ENCOUNTER — Encounter (HOSPITAL_COMMUNITY): Payer: Self-pay | Admitting: Emergency Medicine

## 2020-01-17 ENCOUNTER — Other Ambulatory Visit: Payer: Self-pay

## 2020-01-17 ENCOUNTER — Inpatient Hospital Stay (HOSPITAL_COMMUNITY)
Admission: EM | Admit: 2020-01-17 | Discharge: 2020-01-18 | DRG: 291 | Disposition: A | Discharge status: Home / Self Care | Payer: BC Managed Care – PPO | Attending: Internal Medicine | Admitting: Internal Medicine

## 2020-01-17 DIAGNOSIS — E1159 Type 2 diabetes mellitus with other circulatory complications: Secondary | ICD-10-CM | POA: Diagnosis present

## 2020-01-17 DIAGNOSIS — Z95810 Presence of automatic (implantable) cardiac defibrillator: Secondary | ICD-10-CM

## 2020-01-17 DIAGNOSIS — I255 Ischemic cardiomyopathy: Secondary | ICD-10-CM | POA: Diagnosis present

## 2020-01-17 DIAGNOSIS — Z825 Family history of asthma and other chronic lower respiratory diseases: Secondary | ICD-10-CM

## 2020-01-17 DIAGNOSIS — J9601 Acute respiratory failure with hypoxia: Secondary | ICD-10-CM | POA: Diagnosis present

## 2020-01-17 DIAGNOSIS — R456 Violent behavior: Secondary | ICD-10-CM | POA: Diagnosis not present

## 2020-01-17 DIAGNOSIS — R0602 Shortness of breath: Secondary | ICD-10-CM | POA: Diagnosis not present

## 2020-01-17 DIAGNOSIS — Z20822 Contact with and (suspected) exposure to covid-19: Secondary | ICD-10-CM | POA: Diagnosis not present

## 2020-01-17 DIAGNOSIS — I130 Hypertensive heart and chronic kidney disease with heart failure and stage 1 through stage 4 chronic kidney disease, or unspecified chronic kidney disease: Secondary | ICD-10-CM | POA: Diagnosis not present

## 2020-01-17 DIAGNOSIS — Z7982 Long term (current) use of aspirin: Secondary | ICD-10-CM

## 2020-01-17 DIAGNOSIS — Z86010 Personal history of colonic polyps: Secondary | ICD-10-CM

## 2020-01-17 DIAGNOSIS — R05 Cough: Secondary | ICD-10-CM | POA: Diagnosis not present

## 2020-01-17 DIAGNOSIS — N289 Disorder of kidney and ureter, unspecified: Secondary | ICD-10-CM | POA: Diagnosis not present

## 2020-01-17 DIAGNOSIS — I25709 Atherosclerosis of coronary artery bypass graft(s), unspecified, with unspecified angina pectoris: Secondary | ICD-10-CM | POA: Diagnosis present

## 2020-01-17 DIAGNOSIS — I252 Old myocardial infarction: Secondary | ICD-10-CM

## 2020-01-17 DIAGNOSIS — I509 Heart failure, unspecified: Secondary | ICD-10-CM

## 2020-01-17 DIAGNOSIS — N1831 Chronic kidney disease, stage 3a: Secondary | ICD-10-CM | POA: Diagnosis present

## 2020-01-17 DIAGNOSIS — J449 Chronic obstructive pulmonary disease, unspecified: Secondary | ICD-10-CM | POA: Diagnosis present

## 2020-01-17 DIAGNOSIS — Z794 Long term (current) use of insulin: Secondary | ICD-10-CM

## 2020-01-17 DIAGNOSIS — Z79899 Other long term (current) drug therapy: Secondary | ICD-10-CM

## 2020-01-17 DIAGNOSIS — I160 Hypertensive urgency: Secondary | ICD-10-CM | POA: Diagnosis not present

## 2020-01-17 DIAGNOSIS — R0600 Dyspnea, unspecified: Secondary | ICD-10-CM | POA: Diagnosis not present

## 2020-01-17 DIAGNOSIS — J9602 Acute respiratory failure with hypercapnia: Secondary | ICD-10-CM | POA: Diagnosis present

## 2020-01-17 DIAGNOSIS — Z66 Do not resuscitate: Secondary | ICD-10-CM | POA: Diagnosis present

## 2020-01-17 DIAGNOSIS — E1165 Type 2 diabetes mellitus with hyperglycemia: Secondary | ICD-10-CM | POA: Diagnosis present

## 2020-01-17 DIAGNOSIS — Z87891 Personal history of nicotine dependence: Secondary | ICD-10-CM

## 2020-01-17 DIAGNOSIS — I10 Essential (primary) hypertension: Secondary | ICD-10-CM | POA: Diagnosis present

## 2020-01-17 DIAGNOSIS — E782 Mixed hyperlipidemia: Secondary | ICD-10-CM | POA: Diagnosis not present

## 2020-01-17 DIAGNOSIS — R000 Tachycardia, unspecified: Secondary | ICD-10-CM | POA: Diagnosis not present

## 2020-01-17 DIAGNOSIS — G4733 Obstructive sleep apnea (adult) (pediatric): Secondary | ICD-10-CM | POA: Diagnosis not present

## 2020-01-17 DIAGNOSIS — F1020 Alcohol dependence, uncomplicated: Secondary | ICD-10-CM | POA: Diagnosis present

## 2020-01-17 DIAGNOSIS — Z6828 Body mass index (BMI) 28.0-28.9, adult: Secondary | ICD-10-CM

## 2020-01-17 DIAGNOSIS — R0902 Hypoxemia: Secondary | ICD-10-CM | POA: Diagnosis not present

## 2020-01-17 DIAGNOSIS — Z955 Presence of coronary angioplasty implant and graft: Secondary | ICD-10-CM

## 2020-01-17 DIAGNOSIS — I340 Nonrheumatic mitral (valve) insufficiency: Secondary | ICD-10-CM | POA: Diagnosis present

## 2020-01-17 DIAGNOSIS — E1122 Type 2 diabetes mellitus with diabetic chronic kidney disease: Secondary | ICD-10-CM | POA: Diagnosis not present

## 2020-01-17 DIAGNOSIS — J810 Acute pulmonary edema: Secondary | ICD-10-CM | POA: Diagnosis not present

## 2020-01-17 DIAGNOSIS — E669 Obesity, unspecified: Secondary | ICD-10-CM | POA: Diagnosis not present

## 2020-01-17 DIAGNOSIS — E119 Type 2 diabetes mellitus without complications: Secondary | ICD-10-CM

## 2020-01-17 DIAGNOSIS — I959 Hypotension, unspecified: Secondary | ICD-10-CM | POA: Diagnosis not present

## 2020-01-17 DIAGNOSIS — R069 Unspecified abnormalities of breathing: Secondary | ICD-10-CM | POA: Diagnosis not present

## 2020-01-17 DIAGNOSIS — I5043 Acute on chronic combined systolic (congestive) and diastolic (congestive) heart failure: Secondary | ICD-10-CM | POA: Diagnosis present

## 2020-01-17 DIAGNOSIS — I5023 Acute on chronic systolic (congestive) heart failure: Secondary | ICD-10-CM | POA: Diagnosis not present

## 2020-01-17 DIAGNOSIS — Z91048 Other nonmedicinal substance allergy status: Secondary | ICD-10-CM

## 2020-01-17 DIAGNOSIS — I110 Hypertensive heart disease with heart failure: Secondary | ICD-10-CM | POA: Diagnosis not present

## 2020-01-17 DIAGNOSIS — R0689 Other abnormalities of breathing: Secondary | ICD-10-CM | POA: Diagnosis not present

## 2020-01-17 DIAGNOSIS — K219 Gastro-esophageal reflux disease without esophagitis: Secondary | ICD-10-CM | POA: Diagnosis present

## 2020-01-17 DIAGNOSIS — J811 Chronic pulmonary edema: Secondary | ICD-10-CM | POA: Diagnosis present

## 2020-01-17 DIAGNOSIS — Z8249 Family history of ischemic heart disease and other diseases of the circulatory system: Secondary | ICD-10-CM

## 2020-01-17 DIAGNOSIS — Z888 Allergy status to other drugs, medicaments and biological substances status: Secondary | ICD-10-CM

## 2020-01-17 LAB — CBC
HCT: 46.8 % (ref 39.0–52.0)
Hemoglobin: 14.5 g/dL (ref 13.0–17.0)
MCH: 28.5 pg (ref 26.0–34.0)
MCHC: 31 g/dL (ref 30.0–36.0)
MCV: 91.9 fL (ref 80.0–100.0)
Platelets: 251 10*3/uL (ref 150–400)
RBC: 5.09 MIL/uL (ref 4.22–5.81)
RDW: 12.3 % (ref 11.5–15.5)
WBC: 13.3 10*3/uL — ABNORMAL HIGH (ref 4.0–10.5)
nRBC: 0 % (ref 0.0–0.2)

## 2020-01-17 LAB — MRSA PCR SCREENING: MRSA by PCR: NEGATIVE

## 2020-01-17 LAB — CBC WITH DIFFERENTIAL/PLATELET
Abs Immature Granulocytes: 0.09 10*3/uL — ABNORMAL HIGH (ref 0.00–0.07)
Basophils Absolute: 0.2 10*3/uL — ABNORMAL HIGH (ref 0.0–0.1)
Basophils Relative: 1 %
Eosinophils Absolute: 1.9 10*3/uL — ABNORMAL HIGH (ref 0.0–0.5)
Eosinophils Relative: 11 %
HCT: 49.8 % (ref 39.0–52.0)
Hemoglobin: 15.7 g/dL (ref 13.0–17.0)
Immature Granulocytes: 1 %
Lymphocytes Relative: 47 %
Lymphs Abs: 8 10*3/uL — ABNORMAL HIGH (ref 0.7–4.0)
MCH: 29.4 pg (ref 26.0–34.0)
MCHC: 31.5 g/dL (ref 30.0–36.0)
MCV: 93.3 fL (ref 80.0–100.0)
Monocytes Absolute: 1.3 10*3/uL — ABNORMAL HIGH (ref 0.1–1.0)
Monocytes Relative: 8 %
Neutro Abs: 5.5 10*3/uL (ref 1.7–7.7)
Neutrophils Relative %: 32 %
Platelets: 305 10*3/uL (ref 150–400)
RBC: 5.34 MIL/uL (ref 4.22–5.81)
RDW: 12.4 % (ref 11.5–15.5)
WBC: 17 10*3/uL — ABNORMAL HIGH (ref 4.0–10.5)
nRBC: 0 % (ref 0.0–0.2)

## 2020-01-17 LAB — COMPREHENSIVE METABOLIC PANEL
ALT: 27 U/L (ref 0–44)
AST: 26 U/L (ref 15–41)
Albumin: 3.9 g/dL (ref 3.5–5.0)
Alkaline Phosphatase: 110 U/L (ref 38–126)
Anion gap: 12 (ref 5–15)
BUN: 27 mg/dL — ABNORMAL HIGH (ref 6–20)
CO2: 25 mmol/L (ref 22–32)
Calcium: 8.8 mg/dL — ABNORMAL LOW (ref 8.9–10.3)
Chloride: 99 mmol/L (ref 98–111)
Creatinine, Ser: 1.83 mg/dL — ABNORMAL HIGH (ref 0.61–1.24)
GFR calc Af Amer: 46 mL/min — ABNORMAL LOW (ref >60)
GFR calc non Af Amer: 40 mL/min — ABNORMAL LOW (ref >60)
Glucose, Bld: 361 mg/dL — ABNORMAL HIGH (ref 70–99)
Potassium: 3.3 mmol/L — ABNORMAL LOW (ref 3.5–5.1)
Sodium: 136 mmol/L (ref 135–145)
Total Bilirubin: 0.7 mg/dL (ref 0.3–1.2)
Total Protein: 7.6 g/dL (ref 6.5–8.1)

## 2020-01-17 LAB — BASIC METABOLIC PANEL
Anion gap: 13 (ref 5–15)
BUN: 30 mg/dL — ABNORMAL HIGH (ref 6–20)
CO2: 30 mmol/L (ref 22–32)
Calcium: 9.5 mg/dL (ref 8.9–10.3)
Chloride: 97 mmol/L — ABNORMAL LOW (ref 98–111)
Creatinine, Ser: 1.74 mg/dL — ABNORMAL HIGH (ref 0.61–1.24)
GFR calc Af Amer: 49 mL/min — ABNORMAL LOW (ref >60)
GFR calc non Af Amer: 42 mL/min — ABNORMAL LOW (ref >60)
Glucose, Bld: 336 mg/dL — ABNORMAL HIGH (ref 70–99)
Potassium: 4.3 mmol/L (ref 3.5–5.1)
Sodium: 140 mmol/L (ref 135–145)

## 2020-01-17 LAB — BRAIN NATRIURETIC PEPTIDE: B Natriuretic Peptide: 517 pg/mL — ABNORMAL HIGH (ref 0.0–100.0)

## 2020-01-17 LAB — RESPIRATORY PANEL BY RT PCR (FLU A&B, COVID)
Influenza A by PCR: NEGATIVE
Influenza B by PCR: NEGATIVE
SARS Coronavirus 2 by RT PCR: NEGATIVE

## 2020-01-17 LAB — GLUCOSE, CAPILLARY
Glucose-Capillary: 310 mg/dL — ABNORMAL HIGH (ref 70–99)
Glucose-Capillary: 217 mg/dL — ABNORMAL HIGH (ref 70–99)
Glucose-Capillary: 273 mg/dL — ABNORMAL HIGH (ref 70–99)
Glucose-Capillary: 208 mg/dL — ABNORMAL HIGH (ref 70–99)

## 2020-01-17 LAB — MAGNESIUM: Magnesium: 2.3 mg/dL (ref 1.7–2.4)

## 2020-01-17 LAB — TROPONIN I (HIGH SENSITIVITY)
Troponin I (High Sensitivity): 16 ng/L (ref <18)
Troponin I (High Sensitivity): 36 ng/L — ABNORMAL HIGH (ref <18)

## 2020-01-17 MED ORDER — BUSPIRONE HCL 5 MG PO TABS
10.0000 mg | ORAL_TABLET | Freq: Three times a day (TID) | ORAL | Status: DC
  Administered 2020-01-17 – 2020-01-18 (×4): 10 mg via ORAL
  Filled 2020-01-17 (×4): qty 2

## 2020-01-17 MED ORDER — ONDANSETRON HCL 4 MG/2ML IJ SOLN: 4.0000 mg | Freq: Four times a day (QID) | INTRAMUSCULAR | Status: DC | PRN

## 2020-01-17 MED ORDER — INSULIN ASPART 100 UNIT/ML ~~LOC~~ SOLN
0.0000 [IU] | Freq: Three times a day (TID) | SUBCUTANEOUS | Status: DC
  Administered 2020-01-17: 5 [IU] via SUBCUTANEOUS
  Administered 2020-01-17: 8 [IU] via SUBCUTANEOUS
  Administered 2020-01-17: 11 [IU] via SUBCUTANEOUS

## 2020-01-17 MED ORDER — ADULT MULTIVITAMIN W/MINERALS CH
1.0000 | ORAL_TABLET | Freq: Every day | ORAL | Status: DC
  Administered 2020-01-17 – 2020-01-18 (×2): 1 via ORAL
  Filled 2020-01-17 (×2): qty 1

## 2020-01-17 MED ORDER — IPRATROPIUM-ALBUTEROL 0.5-2.5 (3) MG/3ML IN SOLN: 3.0000 mL | Freq: Four times a day (QID) | RESPIRATORY_TRACT | Status: DC | PRN

## 2020-01-17 MED ORDER — FUROSEMIDE 10 MG/ML IJ SOLN
40.0000 mg | Freq: Two times a day (BID) | INTRAMUSCULAR | Status: DC
  Administered 2020-01-17 – 2020-01-18 (×3): 40 mg via INTRAVENOUS
  Filled 2020-01-17 (×3): qty 4

## 2020-01-17 MED ORDER — ENOXAPARIN SODIUM 40 MG/0.4ML ~~LOC~~ SOLN: 40.0000 mg | SUBCUTANEOUS | Status: DC

## 2020-01-17 MED ORDER — ONDANSETRON HCL 4 MG PO TABS: 4.0000 mg | ORAL_TABLET | Freq: Four times a day (QID) | ORAL | Status: DC | PRN

## 2020-01-17 MED ORDER — FUROSEMIDE 40 MG PO TABS: 80.0000 mg | ORAL_TABLET | Freq: Once | ORAL | Status: DC

## 2020-01-17 MED ORDER — ROSUVASTATIN CALCIUM 20 MG PO TABS
40.0000 mg | ORAL_TABLET | Freq: Every day | ORAL | Status: DC
  Administered 2020-01-17 – 2020-01-18 (×2): 40 mg via ORAL
  Filled 2020-01-17 (×3): qty 2

## 2020-01-17 MED ORDER — SODIUM CHLORIDE 0.9 % IV SOLN: 250.0000 mL | INTRAVENOUS | Status: DC | PRN

## 2020-01-17 MED ORDER — ALBUTEROL (5 MG/ML) CONTINUOUS INHALATION SOLN
10.0000 mg/h | INHALATION_SOLUTION | Freq: Once | RESPIRATORY_TRACT | Status: AC
  Administered 2020-01-17: 10 mg/h via RESPIRATORY_TRACT

## 2020-01-17 MED ORDER — NITROGLYCERIN 0.4 MG SL SUBL: 0.4000 mg | SUBLINGUAL_TABLET | SUBLINGUAL | Status: DC | PRN

## 2020-01-17 MED ORDER — ACETAMINOPHEN 325 MG PO TABS: 650.0000 mg | ORAL_TABLET | Freq: Four times a day (QID) | ORAL | Status: DC | PRN

## 2020-01-17 MED ORDER — ORAL CARE MOUTH RINSE
15.0000 mL | Freq: Two times a day (BID) | OROMUCOSAL | Status: DC
  Administered 2020-01-17: 15 mL via OROMUCOSAL

## 2020-01-17 MED ORDER — SODIUM CHLORIDE 0.9% FLUSH
3.0000 mL | Freq: Two times a day (BID) | INTRAVENOUS | Status: DC
  Administered 2020-01-17 (×2): 3 mL via INTRAVENOUS

## 2020-01-17 MED ORDER — HEPARIN SODIUM (PORCINE) 5000 UNIT/ML IJ SOLN
5000.0000 [IU] | Freq: Three times a day (TID) | INTRAMUSCULAR | Status: DC
  Administered 2020-01-17 – 2020-01-18 (×4): 5000 [IU] via SUBCUTANEOUS
  Filled 2020-01-17 (×4): qty 1

## 2020-01-17 MED ORDER — FUROSEMIDE 10 MG/ML IJ SOLN
80.0000 mg | Freq: Once | INTRAMUSCULAR | Status: AC
  Administered 2020-01-17: 80 mg via INTRAVENOUS

## 2020-01-17 MED ORDER — ACETAMINOPHEN 650 MG RE SUPP: 650.0000 mg | Freq: Four times a day (QID) | RECTAL | Status: DC | PRN

## 2020-01-17 MED ORDER — IPRATROPIUM BROMIDE 0.02 % IN SOLN
0.5000 mg | Freq: Once | RESPIRATORY_TRACT | Status: AC
  Administered 2020-01-17: 0.5 mg via RESPIRATORY_TRACT

## 2020-01-17 MED ORDER — POTASSIUM CHLORIDE CRYS ER 20 MEQ PO TBCR
20.0000 meq | EXTENDED_RELEASE_TABLET | Freq: Every day | ORAL | Status: DC
  Administered 2020-01-17 – 2020-01-18 (×2): 20 meq via ORAL
  Filled 2020-01-17 (×2): qty 1

## 2020-01-17 MED ORDER — ALPRAZOLAM 0.25 MG PO TABS
0.2500 mg | ORAL_TABLET | Freq: Every evening | ORAL | Status: DC | PRN
  Administered 2020-01-17: 0.25 mg via ORAL
  Filled 2020-01-17: qty 1

## 2020-01-17 MED ORDER — MOMETASONE FURO-FORMOTEROL FUM 100-5 MCG/ACT IN AERO: 2.0000 | INHALATION_SPRAY | Freq: Two times a day (BID) | RESPIRATORY_TRACT | Status: DC

## 2020-01-17 MED ORDER — INSULIN ASPART 100 UNIT/ML ~~LOC~~ SOLN
0.0000 [IU] | Freq: Every day | SUBCUTANEOUS | Status: DC
  Administered 2020-01-17: 2 [IU] via SUBCUTANEOUS

## 2020-01-17 MED ORDER — POLYETHYLENE GLYCOL 3350 17 G PO PACK: 17.0000 g | PACK | Freq: Every day | ORAL | Status: DC | PRN

## 2020-01-17 MED ORDER — UMECLIDINIUM BROMIDE 62.5 MCG/INH IN AEPB: 1.0000 | INHALATION_SPRAY | Freq: Every day | RESPIRATORY_TRACT | Status: DC

## 2020-01-17 MED ORDER — INSULIN GLARGINE 100 UNIT/ML ~~LOC~~ SOLN
50.0000 [IU] | Freq: Every day | SUBCUTANEOUS | Status: DC
  Administered 2020-01-17: 50 [IU] via SUBCUTANEOUS
  Filled 2020-01-17 (×2): qty 0.5

## 2020-01-17 MED ORDER — TRAMADOL HCL 50 MG PO TABS: 50.0000 mg | ORAL_TABLET | Freq: Three times a day (TID) | ORAL | Status: DC | PRN

## 2020-01-17 MED ORDER — SERTRALINE HCL 50 MG PO TABS
25.0000 mg | ORAL_TABLET | Freq: Every day | ORAL | Status: DC
  Administered 2020-01-17 – 2020-01-18 (×2): 25 mg via ORAL
  Filled 2020-01-17 (×2): qty 1

## 2020-01-17 MED ORDER — BISACODYL 10 MG RE SUPP: 10.0000 mg | Freq: Every day | RECTAL | Status: DC | PRN

## 2020-01-17 MED ORDER — FUROSEMIDE 10 MG/ML IJ SOLN
INTRAMUSCULAR | Status: AC
  Filled 2020-01-17: qty 8

## 2020-01-17 MED ORDER — CHLORHEXIDINE GLUCONATE CLOTH 2 % EX PADS
6.0000 | MEDICATED_PAD | Freq: Every day | CUTANEOUS | Status: DC
  Administered 2020-01-17 – 2020-01-18 (×2): 6 via TOPICAL

## 2020-01-17 MED ORDER — SPIRONOLACTONE 25 MG PO TABS
25.0000 mg | ORAL_TABLET | Freq: Every day | ORAL | Status: DC
  Administered 2020-01-17 – 2020-01-18 (×2): 25 mg via ORAL
  Filled 2020-01-17 (×3): qty 1

## 2020-01-17 MED ORDER — METOPROLOL SUCCINATE ER 25 MG PO TB24
25.0000 mg | ORAL_TABLET | Freq: Every day | ORAL | Status: DC
  Administered 2020-01-17 – 2020-01-18 (×2): 25 mg via ORAL
  Filled 2020-01-17 (×2): qty 1

## 2020-01-17 MED ORDER — ASPIRIN EC 81 MG PO TBEC
81.0000 mg | DELAYED_RELEASE_TABLET | Freq: Every day | ORAL | Status: DC
  Administered 2020-01-17 – 2020-01-18 (×2): 81 mg via ORAL
  Filled 2020-01-17 (×5): qty 1

## 2020-01-17 MED ORDER — SODIUM CHLORIDE 0.9% FLUSH
3.0000 mL | INTRAVENOUS | Status: DC | PRN
  Administered 2020-01-17: 3 mL via INTRAVENOUS

## 2020-01-17 MED ORDER — NITROGLYCERIN 2 % TD OINT
1.0000 [in_us] | TOPICAL_OINTMENT | Freq: Once | TRANSDERMAL | Status: AC
  Administered 2020-01-17: 1 [in_us] via TOPICAL
  Filled 2020-01-17: qty 1

## 2020-01-17 NOTE — H&P (Signed)
History and Physical    Patient Demographics:    Antonio Cobb [id number] DOB: Aug 01, 1961 DOA: 01/17/2020  PCP: Antonio Helper, MD  Patient coming from: Home  I have personally briefly reviewed patient's old medical records in [place]  Chief Complaint: Shortness of breath   Assessment & Plan:     Assessment/Plan Active Problems:   Insulin-requiring or dependent type II diabetes mellitus ([place])   Mixed hyperlipidemia   Essential hypertension   Acute on chronic combined systolic and diastolic CHF (congestive heart failure) ([place])   Coronary artery disease involving coronary bypass graft of native heart with angina pectoris ([place])   Acute respiratory failure with hypoxia ([place])   Pulmonary edema     Principal Problem: Acute exacerbation of chronic congestive heart failure with reduced ejection fraction: S/p AICD placement Patient recently admitted from 11/18/2019-11/20/2019 with similar presentation.  Has returned back with worsening shortness of breath.  Appears to be a recurrent exacerbation of CHF.  Did have a echocardiogram on 11/13/2019 which showed EF of 25%, severely decreased LV function, moderately dilated LV cavity. Trigger for current episode of CHF is unclear.  No dietary noncompliance.  Claims he is not missed any medication doses.  No AICD discharge.  No chest pain, fever, shortness of breath. Plan: We will place on IV Lasix Continue spironolactone Monitor input output, renal function, daily weights Nitro as needed Continue beta-blockers, tolerated.  Will decrease dose of metoprolol temporarily to allow room for diuresis.  Hold losartan due to worsening renal function.  Other Active Problems: Acute on chronic hypoxemic respiratory failure: Patient requiring BiPAP on presentation.  Feels significantly improved after having received nitrates, Lasix, BiPAP in ER. We will continue BiPAP, continue oxygen supplementation as needed  Coronary artery  disease status post and placement: -Continue aspirin  Hypertension: Continue metoprolol  Hyperlipidemia: Continue Crestor  Insulin-dependent diabetes mellitus type 2: Uncontrolled, most recent A1c 10.2%. Continue home dose of Lantus We will place on sliding scale insulin coverage with fingerstick monitoring  History of alcohol abuse: No recent alcohol use Counseled about cessation  Mild acute on chronic renal failure with baseline chronic kidney disease stage III: Creatinine is 1.83.  appears to be slightly worse than baseline of around 1.4-1.6.  Chronic COPD: Shortness of breath likely appear to be secondary to CHF exacerbation and not COPD.  We will continue Dulera, Incruse Ellipta and continue DuoNebs as needed.  Do not believe he needs steroids at this point.  DVT prophylaxis: Heparin subcu Code Status:  Full code Family Communication: N/A  Disposition Plan: Admitted as inpatient for CHF exacerbation, will give diuretics Consults called: N/A Admission status: Inpatient      HPI:     HPI: Antonio Cobb is a 59 y.o. male with medical history significant of alcohol abuse, coronary artery disease status post STEMI with DES to left circumflex in 0000000, systolic heart failure with ejection fraction 25 to 30% and is status post ICD, COPD, type 2 diabetes mellitus poorly controlled and insulin-dependent, essential hypertension, hyperlipidemia, chronic kidney disease a stage IIIa who presented to the ER with worsening shortness of breath, dyspnea on exertion.  Patient states he had relatively sudden onset shortness of breath last night and woke up with respiratory distress.  Claims compliance to Lasix.  Has had no chest pain.  No fever, chills, diaphoresis, congestion, syncope, sore throat.  No significant change in lower extremity swelling. ED Course:  Vital Signs reviewed on presentation, significant for patient is afebrile, heart  rate 98, blood pressure 132/96,  saturation 98% on 60% BiPAP. Labs reviewed, significant for sodium 136, potassium 3.3, chloride 99, BUN 27, creatinine 1.8, LFTs within normal limits, BNP 517, troponin negative, WBC count 17, hemoglobin 15.7, hematocrit 49, platelets 305, glucose 361 Imaging personally Reviewed, chest x-ray shows worsening pulmonary edema. EKG personally reviewed, shows sinus tachycardia, no other acute ST-T changes.    Review of systems:    Review of Systems: As per HPI otherwise 10 point review of systems negative.  All other review of systems is negative except the ones noted above in the HPI.    Past Medical and Surgical History:  Reviewed by me  Past Medical History:  Diagnosis Date  . Alcohol abuse   . CAD (coronary artery disease)    a. s/p inferolateral STEMI in 05/2012 with DES to LCx b. patent stent by cath in 05/2016 with moderate RCA and LAD disease  . Cardiomyopathy ([place])    a. LVEF 30-35% by echo in 09/2016. b. 08/2017: echo showing EF remains reduced at 25-30% c. s/p [employer] ICD placement in 01/2018  . CHF (congestive heart failure) ([place])   . COPD (chronic obstructive pulmonary disease) ([place])   . Diabetes mellitus, type 2 ([place])   . Essential hypertension   . Mitral regurgitation    Moderate  . Mixed hyperlipidemia   . Myocardial infarction ([place]) 05/23/2012   Inferolateral STEMI  . Obesity     Past Surgical History:  Procedure Laterality Date  . CARDIAC CATHETERIZATION  2 yrs ago  . CARDIAC CATHETERIZATION N/A 05/15/2016   Procedure: Right/Left Heart Cath and Coronary Angiography;  Surgeon: [name], MD;  Location: [facility];  Service: Cardiovascular;  Laterality: N/A;  . CORONARY STENT PLACEMENT  05/23/12  . ICD IMPLANT N/A 02/01/2018   Procedure: ICD IMPLANT;  Surgeon: Evans Lance, MD;  Location: [facility];  Service: Cardiovascular;  Laterality: N/A;  . LEFT HEART CATHETERIZATION WITH CORONARY ANGIOGRAM N/A 05/23/2012   Procedure: LEFT  HEART CATHETERIZATION WITH CORONARY ANGIOGRAM;  Surgeon: [name], MD;  Location: [facility] CATH LAB;  Service: Cardiovascular;  Laterality: N/A;  . None    . PERCUTANEOUS CORONARY STENT INTERVENTION (PCI-S) N/A 05/23/2012   Procedure: PERCUTANEOUS CORONARY STENT INTERVENTION (PCI-S);  Surgeon: [name], MD;  Location: [facility] CATH LAB;  Service: Cardiovascular;  Laterality: N/A;  . POLYPECTOMY  10/16/2011   Procedure: POLYPECTOMY;  Surgeon: [name], MD;  Location: [facility];  Service: Endoscopy;;  Polypoid Lesion, Transverse and Sigmoid Colon     Social History:  Reviewed by me   reports that he quit smoking about 3 years ago. His smoking use included cigars. He has a 7.50 pack-year smoking history. He has never used smokeless tobacco. He reports current alcohol use. He reports that he does not use drugs.  Allergies:    Allergies  Allergen Reactions  . Entresto [Sacubitril-Valsartan]     Dizziness, Coughing  . Other     Seasonal allergies  - has to use inhaler    Family History :   Family History  Problem Relation Age of Onset  . Emphysema Mother   . Heart attack Mother   . Hypertension Mother   . Diabetes Mother   . Heart disease Mother   . COPD Mother   . Emphysema Father   . COPD Father   . Stroke Sister   . Asthma Sister   . Colon cancer Neg Hx   . Anesthesia  problems Neg Hx   . Hypotension Neg Hx   . Malignant hyperthermia Neg Hx   . Pseudochol deficiency Neg Hx    Family history reviewed, noted as above, not pertinent to current presentation.   Home Medications:    Prior to Admission medications   Medication Sig Start Date End Date Taking? Authorizing Provider  albuterol (VENTOLIN HFA) 108 (90 Base) MCG/ACT inhaler Inhale 2 puffs into the lungs every 6 (six) hours as needed for wheezing or shortness of breath. 11/20/19   Roxan Hockey, MD  ALPRAZolam Duanne Moron) 0.25 MG tablet Take 1 tablet (0.25 mg total) by mouth at bedtime as needed for anxiety or sleep (As  needed for anxiety/insomnia/panic attack). 11/20/19   Roxan Hockey, MD  aspirin EC 81 MG EC tablet Take 1 tablet (81 mg total) by mouth daily with breakfast. 11/20/19   Emokpae, Courage, MD  busPIRone (BUSPAR) 10 MG tablet Take 1 tablet (10 mg total) by mouth 3 (three) times daily. For anxiety 11/20/19   Roxan Hockey, MD  glucose blood (FREESTYLE LITE) test strip TEST FOUR TIMES DAILY 10/05/19   Antonio Helper, MD  hydrOXYzine (ATARAX/VISTARIL) 25 MG tablet Take one tablet by mouth at bedtime, for anxiety and sleep 12/04/19   Antonio Helper, MD  insulin regular (NOVOLIN R,HUMULIN R) 100 units/mL injection Inject 0.1 mLs (10 Units total) into the skin 3 (three) times daily before meals. 03/27/19   Cassandria Anger, MD  ipratropium-albuterol (DUONEB) 0.5-2.5 (3) MG/3ML SOLN Take 3 mLs by nebulization every 6 (six) hours as needed. 11/20/19   Roxan Hockey, MD  LANTUS SOLOSTAR 100 UNIT/ML Solostar Pen ADMINISTER 50 UNITS UNDER THE SKIN EVERY NIGHT AT BEDTIME 08/14/19   Cassandria Anger, MD  losartan (COZAAR) 25 MG tablet Take 1 tablet (25 mg total) by mouth daily. 11/20/19   Roxan Hockey, MD  metoprolol succinate (TOPROL-XL) 50 MG 24 hr tablet TAKE 1 TABLET BY MOUTH EVERY DAY WITH FOOD 11/20/19   Roxan Hockey, MD  mometasone-formoterol (DULERA) 100-5 MCG/ACT AERO Inhale 2 puffs into the lungs 2 (two) times daily. 11/20/19   Roxan Hockey, MD  Multiple Vitamins-Minerals (MULTIVITAMIN WITH MINERALS) tablet Take 1 tablet by mouth daily. 11/20/19 11/19/20  Roxan Hockey, MD  nitroGLYCERIN (NITROSTAT) 0.4 MG SL tablet Place 1 tablet (0.4 mg total) under the tongue every 5 (five) minutes as needed for chest pain. 05/29/16 11/24/19  Lendon Colonel, NP  potassium chloride SA (KLOR-CON) 20 MEQ tablet Take 1 tablet (20 mEq total) by mouth daily. 11/20/19   Roxan Hockey, MD  rosuvastatin (CRESTOR) 40 MG tablet Take 1 tablet (40 mg total) by mouth daily. 11/20/19   Roxan Hockey, MD  sertraline (ZOLOFT) 25 MG tablet Take 1 tablet (25 mg total) by mouth daily. For anxiety/panic attacks 11/20/19 11/19/20  Roxan Hockey, MD  spironolactone (ALDACTONE) 25 MG tablet Take 1 tablet (25 mg total) by mouth daily. 11/20/19   Roxan Hockey, MD  torsemide (DEMADEX) 20 MG tablet Take 2 tablets (40 mg total) by mouth daily. 11/24/19   Strader, Fransisco Hertz, PA-C  umeclidinium bromide (INCRUSE ELLIPTA) 62.5 MCG/INH AEPB Inhale 1 puff into the lungs daily. 11/20/19   Roxan Hockey, MD    Physical Exam:    Physical Exam: Vitals:   01/17/20 0213 01/17/20 0222 01/17/20 0224  SpO2: 100% 100% 100%    Constitutional: NAD, calm, comfortable Vitals:   01/17/20 0213 01/17/20 0222 01/17/20 0224  SpO2: 100% 100% 100%   Eyes: PERRL, lids and conjunctivae  normal ENMT: Mucous membranes are moist. Posterior pharynx clear of any exudate or lesions.Normal dentition.  Neck: normal, supple, no masses, no thyromegaly Respiratory: clear to auscultation bilaterally, no wheezing, bilateral diffuse crepitations.  Mild respiratory distress on BiPAP.[name] No accessory muscle use.  Cardiovascular: Mild tachycardia, no murmurs / rubs / gallops. No extremity edema. 2+ pedal pulses. No carotid bruits.  Abdomen: no tenderness, no masses palpated. No hepatosplenomegaly. Bowel sounds positive.  Musculoskeletal: no clubbing / cyanosis. No joint deformity upper and lower extremities. Good ROM, no contractures. Normal muscle tone.  Skin: no rashes, lesions, ulcers. No induration Neurologic: CN 2-12 grossly intact. Sensation intact, DTR normal. Strength 5/5 in all 4.  Psychiatric: Normal judgment and insight. Alert and oriented x 3. Normal mood.    Decubitus Ulcers: Not present on admission Catheters and tubes: None  Data Review:    Labs on Admission: I have personally reviewed following labs and imaging studies  CBC: Recent Labs  Lab 01/17/20 0226  WBC 17.0*  NEUTROABS 5.5  HGB 15.7  HCT  49.8  MCV 93.3  PLT 123456   Basic Metabolic Panel: Recent Labs  Lab 01/17/20 0226  NA 136  K 3.3*  CL 99  CO2 25  GLUCOSE 361*  BUN 27*  CREATININE 1.83*  CALCIUM 8.8*   GFR: Estimated Creatinine Clearance: 49.8 mL/min (A) (by C-G formula based on SCr of 1.83 mg/dL (H)). Liver Function Tests: Recent Labs  Lab 01/17/20 0226  AST 26  ALT 27  ALKPHOS 110  BILITOT 0.7  PROT 7.6  ALBUMIN 3.9   No results for input(s): LIPASE, AMYLASE in the last 168 hours. No results for input(s): AMMONIA in the last 168 hours. Coagulation Profile: No results for input(s): INR, PROTIME in the last 168 hours. Cardiac Enzymes: No results for input(s): CKTOTAL, CKMB, CKMBINDEX, TROPONINI in the last 168 hours. BNP (last 3 results) No results for input(s): PROBNP in the last 8760 hours. HbA1C: No results for input(s): HGBA1C in the last 72 hours. CBG: No results for input(s): GLUCAP in the last 168 hours. Lipid Profile: No results for input(s): CHOL, HDL, LDLCALC, TRIG, CHOLHDL, LDLDIRECT in the last 72 hours. Thyroid Function Tests: No results for input(s): TSH, T4TOTAL, FREET4, T3FREE, THYROIDAB in the last 72 hours. Anemia Panel: No results for input(s): VITAMINB12, FOLATE, FERRITIN, TIBC, IRON, RETICCTPCT in the last 72 hours. Urine analysis:    Component Value Date/Time   COLORURINE COLORLESS (A) 09/06/2017 0452   APPEARANCEUR CLEAR 09/06/2017 0452   LABSPEC 1.006 09/06/2017 0452   PHURINE 5.0 09/06/2017 0452   GLUCOSEU >=500 (A) 09/06/2017 0452   GLUCOSEU NEG mg/dL 03/04/2007 0829   HGBUR NEGATIVE 09/06/2017 0452   HGBUR negative 11/13/2010 [place] 09/06/2017 [place] 09/06/2017 0452   PROTEINUR NEGATIVE 09/06/2017 0452   UROBILINOGEN 0.2 03/23/2015 1805   NITRITE NEGATIVE 09/06/2017 0452   LEUKOCYTESUR NEGATIVE 09/06/2017 0452     Imaging Results:      Radiological Exams on Admission: DG Chest Port 1 View  Result Date:  01/17/2020 CLINICAL DATA:  Shortness of breath EXAM: PORTABLE CHEST 1 VIEW COMPARISON:  11/19/2019 FINDINGS: Cardiac shadow is mildly enlarged but stable. Defibrillator is again seen. Vascular congestion is noted with changes of pulmonary edema increased from the prior exam. No sizable effusion is noted. No bony abnormality is seen. IMPRESSION: Increasing pulmonary edema when compare with the prior study. Electronically Signed   By: Inez Catalina M.D.   On: 01/17/2020 02:38  Lynetta Mare MD [facility]  If 7PM-7AM, please contact night-coverage   01/17/2020, 3:45 AM

## 2020-01-17 NOTE — ED Triage Notes (Signed)
Pt c/o sob at 0130 and when ems arrived pt oxygen level was in high 70s room air. Pt was also hypertensive and ems gave 2 nitro.

## 2020-01-17 NOTE — Evaluation (Signed)
Physical Therapy Evaluation Patient Details Name: Antonio Cobb MRN: [id number] DOB: 1961/06/21 Today's Date: 01/17/2020   History of Present Illness  Antonio Cobb is a 59 y.o. male with medical history significant of  alcohol abuse, coronary artery disease status post STEMI with DES to left circumflex in 0000000, systolic heart failure with ejection fraction 25 to 30% and is status post ICD, COPD, type 2 diabetes mellitus poorly controlled and insulin-dependent, essential hypertension, hyperlipidemia, chronic kidney disease a stage IIIa who presented to the ER with worsening shortness of breath, dyspnea on exertion.  Patient states he had relatively sudden onset shortness of breath last night and woke up with respiratory distress.  Claims compliance to Lasix.  Has had no chest pain.  No fever, chills, diaphoresis, congestion, syncope, sore throat.  No significant change in lower extremity swelling.    Clinical Impression  Patient functioning at baseline for functional mobility and gait.  Plan:  Patient discharged from physical therapy to care of nursing for ambulation daily as tolerated for length of stay.     Follow Up Recommendations No PT follow up    Equipment Recommendations  None recommended by PT    Recommendations for Other Services       Precautions / Restrictions Precautions Precautions: None Restrictions Weight Bearing Restrictions: No      Mobility  Bed Mobility Overal bed mobility: Modified Independent             General bed mobility comments: slightly increased time  Transfers Overall transfer level: Modified independent               General transfer comment: slightly increased time  Ambulation/Gait Ambulation/Gait assistance: Modified independent (Device/Increase time) Gait Distance (Feet): 200 Feet Assistive device: None Gait Pattern/deviations: WFL(Within Functional Limits) Gait velocity: slightly decreased   General Gait Details:  demonstrates good return for ambulation in room and hallways without loss of balance  Stairs            Wheelchair Mobility    Modified Rankin (Stroke Patients Only)       Balance Overall balance assessment: No apparent balance deficits (not formally assessed)                                           Pertinent Vitals/Pain Pain Assessment: No/denies pain    Home Living Family/patient expects to be discharged to:: Private residence Living Arrangements: Spouse/significant other Available Help at Discharge: Family;Available PRN/intermittently Type of Home: House Home Access: Stairs to enter Entrance Stairs-Rails: Right;Left;Can reach both Entrance Stairs-Number of Steps: 5-6 Home Layout: One level Home Equipment: Cane - single point      Prior Function Level of Independence: Independent         Comments: Hydrographic surveyor, drives, works as a Administrator Extremity Assessment Upper Extremity Assessment: Overall WFL for tasks assessed    Lower Extremity Assessment Lower Extremity Assessment: Overall WFL for tasks assessed    Cervical / Trunk Assessment Cervical / Trunk Assessment: Normal  Communication   Communication: No difficulties  Cognition Arousal/Alertness: Awake/alert Behavior During Therapy: WFL for tasks assessed/performed Overall Cognitive Status: Within Functional Limits for tasks assessed  General Comments      Exercises     Assessment/Plan    PT Assessment Patent does not need any further PT services  PT Problem List         PT Treatment Interventions      PT Goals (Current goals can be found in the Care Plan section)  Acute Rehab PT Goals Patient Stated Goal: return home PT Goal Formulation: With patient Time For Goal Achievement: 01/17/20 Potential to Achieve Goals: Good    Frequency      Barriers to discharge        Co-evaluation               AM-PAC PT "6 Clicks" Mobility  Outcome Measure Help needed turning from your back to your side while in a flat bed without using bedrails?: None Help needed moving from lying on your back to sitting on the side of a flat bed without using bedrails?: None Help needed moving to and from a bed to a chair (including a wheelchair)?: None Help needed standing up from a chair using your arms (e.g., wheelchair or bedside chair)?: None Help needed to walk in hospital room?: None Help needed climbing 3-5 steps with a railing? : None 6 Click Score: 24    End of Session   Activity Tolerance: Patient tolerated treatment well Patient left: in chair Nurse Communication: Mobility status PT Visit Diagnosis: Unsteadiness on feet (R26.81);Other abnormalities of gait and mobility (R26.89);Muscle weakness (generalized) (M62.81)    Time: [id number] PT Time Calculation (min) (ACUTE ONLY): 27 min   Charges:   PT Evaluation $PT Eval Moderate Complexity: 1 Mod PT Treatments $Therapeutic Activity: 23-37 mins        9:43 AM, 01/17/20 Lonell Grandchild, MPT Physical Therapist with [facility] 336 [phone] office [phone] mobile phone

## 2020-01-17 NOTE — Progress Notes (Signed)
Inpatient Diabetes Program Recommendations  AACE/ADA: New Consensus Statement on Inpatient Glycemic Control   Target Ranges:  Prepandial:   less than 140 mg/dL      Peak postprandial:   less than 180 mg/dL (1-2 hours)      Critically ill patients:  140 - 180 mg/dL  Results for Antonio Cobb, Antonio Cobb (MRN [id number]) as of 01/17/2020 07:49  Ref. Range 01/17/2020 07:32  Glucose-Capillary Latest Ref Range: 70 - 99 mg/dL 310 (H)   Results for Antonio Cobb, Antonio Cobb (MRN [id number]) as of 01/17/2020 07:49  Ref. Range 01/17/2020 02:26 01/17/2020 05:09  Glucose Latest Ref Range: 70 - 99 mg/dL 361 (H) 336 (H)  Results for Antonio Cobb, Antonio Cobb (MRN [id number]) as of 01/17/2020 07:49  Ref. Range 10/23/2019 09:55  Hemoglobin A1C Latest Ref Range: <5.7 % of total Hgb 10.2 (H)   Review of Glycemic Control  Diabetes history: DM2 Outpatient Diabetes medications:Lantus 50 units QHS, Regular 10 units TID with meals Current orders for Inpatient glycemic control: Lantus 50 units QHS, Novolog 0-15 units TID with meals, Novolog 0-5 units QHS  Inpatient Diabetes Program Recommendations:   Insulin - Basal: Patient reports that he took Lantus 50 units at home last night around midnight prior to coming to the hospital.   HgbA1C: Please consider ordering a current A1C to evaluate glycemic control over the past 2-3 months.   A1C 10.2% on 10/23/19 indicating an average glucose of 246 mg/dl.  NOTE: Spoke with patient over the phone regarding DM control. Patient confirms that he takes Lantus 50 units QHS and Regular 10 units TID with meals for DM as an outpatient. Patient reports that his glucose has been running in the 150-170' mg/dl usually. Discussed initial glucose of 361 mg/dl on labs and 310 mg/dl finger stick this morning. Patient reports that he took Lantus 50 units at home last night around midnight. Discussed A1C of 10.2% on 10/23/19 and explained that A1C indicated an average glucose of 246 mg/dl. Patient states that his glucose  has not been running that high at home. Stressed importance of improving DM control and encouraged patient to follow up with PCP regarding DM control. Patient reported that a doctor just came in his room and he needed to get off the phone to talk with the doctor.  Patient verbalized understanding of information and states he has no questions at this time.  Thanks, Barnie Alderman, RN, MSN, CDE Diabetes Coordinator Inpatient Diabetes Program [phone] (Team Pager from 8am to 5pm)

## 2020-01-17 NOTE — Progress Notes (Signed)
PROGRESS NOTE    Antonio Cobb  [id number] DOB: 1961/04/27 DOA: 01/17/2020 PCP: [name], MD    Brief Narrative:  This 59 year old man with ischemic cardiomyopathy with ejection fraction of 25% was admitted yesterday night again with exacerbation of his systolic congestive heart failure.  He has been placed on intravenous Lasix and already he is feeling improved.  He was placed on BiPAP but he would like to have this removed now.  He denies any chest pain.   Assessment & Plan:   Active Problems:   Insulin-requiring or dependent type II diabetes mellitus ([place])   Mixed hyperlipidemia   Essential hypertension   Acute on chronic combined systolic and diastolic CHF (congestive heart failure) (H[facility])   Coronary artery disease involving coronary bypass graft of native heart with angina pectoris (H[facility])   Acute respiratory failure with hypoxia ([facility])   Pulmonary edema   CHF exacerbation ([place])   1.  Acute on chronic systolic congestive heart failure.  Clinically improving.  Discontinue BiPAP and monitor progress.  Continue with intravenous Lasix at the current dose of 40 mg twice a day.  Monitor electrolytes daily. 2.  Coronary artery disease.  No acute chest pain. 3.  Hypertension.  Stable. 4.  Type 2 diabetes mellitus.  Continue sliding scale of insulin.   DVT prophylaxis: Heparin Code Status: Full code Family Communication: Not applicable. Disposition Plan: Anticipate discharge to home in the next 1 to 2 days.  Once we can liberate him from BiPAP and he is stable with improving chest x-ray, I think he should be able to go home.   Consultants:   None.  Procedures:   None.  Antimicrobials:   None.   Subjective: Improved significantly from yesterday in terms with dyspnea.  Objective: Vitals:   01/17/20 0640 01/17/20 0700 01/17/20 0701 01/17/20 0734  BP:  129/77    Pulse:  91  85  Resp:   18 18  Temp: (!) 97.5 F (36.4 C)   97.8 F (36.6 C)  TempSrc:  Axillary   Axillary  SpO2:  100%  100%  Weight: 89.6 kg     Height: 5\' 10"  (1.778 m)       Intake/Output Summary (Last 24 hours) at 01/17/2020 0751 Last data filed at 01/17/2020 0734 Gross per 24 hour  Intake --  Output 800 ml  Net -800 ml   Filed Weights   01/17/20 0640  Weight: 89.6 kg    Examination:   Hemodynamically stable.  Afebrile.  Seen on BiPAP but appears to be uncomfortable with BiPAP.  Lung fields show bilateral inspiratory crackles from mid  to the lower zones.  No bronchial breathing.  Alert and orientated.  Data Reviewed: I have personally reviewed following labs and imaging studies  CBC: Recent Labs  Lab 01/17/20 0226 01/17/20 0509  WBC 17.0* 13.3*  NEUTROABS 5.5  --   HGB 15.7 14.5  HCT 49.8 46.8  MCV 93.3 91.9  PLT 305 [id number]   Basic Metabolic Panel: Recent Labs  Lab 01/17/20 0226 01/17/20 0509  NA 136 140  K 3.3* 4.3  CL 99 97*  CO2 25 30  GLUCOSE 361* 336*  BUN 27* 30*  CREATININE 1.83* 1.74*  CALCIUM 8.8* 9.5  MG  --  2.3   GFR: Estimated Creatinine Clearance: 51.5 mL/min (A) (by C-G formula based on SCr of 1.74 mg/dL (H)). Liver Function Tests: Recent Labs  Lab 01/17/20 0226  AST 26  ALT 27  ALKPHOS 110  BILITOT  0.7  PROT 7.6  ALBUMIN 3.9   No results for input(s): LIPASE, AMYLASE in the last 168 hours. No results for input(s): AMMONIA in the last 168 hours. Coagulation Profile: No results for input(s): INR, PROTIME in the last 168 hours. Cardiac Enzymes: No results for input(s): CKTOTAL, CKMB, CKMBINDEX, TROPONINI in the last 168 hours. BNP (last 3 results) No results for input(s): PROBNP in the last 8760 hours. HbA1C: No results for input(s): HGBA1C in the last 72 hours. CBG: Recent Labs  Lab 01/17/20 0732  GLUCAP 310*   Lipid Profile: No results for input(s): CHOL, HDL, LDLCALC, TRIG, CHOLHDL, LDLDIRECT in the last 72 hours. Thyroid Function Tests: No results for input(s): TSH, T4TOTAL, FREET4, T3FREE, THYROIDAB in  the last 72 hours. Anemia Panel: No results for input(s): VITAMINB12, FOLATE, FERRITIN, TIBC, IRON, RETICCTPCT in the last 72 hours. Sepsis Labs: No results for input(s): PROCALCITON, LATICACIDVEN in the last 168 hours.  Recent Results (from the past 240 hour(s))  Respiratory Panel by RT PCR (Flu A&B, Covid) - Nasopharyngeal Swab     Status: None   Collection Time: 01/17/20  3:48 AM   Specimen: Nasopharyngeal Swab  Result Value Ref Range Status   SARS Coronavirus 2 by RT PCR NEGATIVE NEGATIVE Final    Comment: (NOTE) SARS-CoV-2 target nucleic acids are NOT DETECTED. The SARS-CoV-2 RNA is generally detectable in upper respiratoy specimens during the acute phase of infection. The lowest concentration of SARS-CoV-2 viral copies this assay can detect is 131 copies/mL. A negative result does not preclude SARS-Cov-2 infection and should not be used as the sole basis for treatment or other patient management decisions. A negative result may occur with  improper specimen collection/handling, submission of specimen other than nasopharyngeal swab, presence of viral mutation(s) within the areas targeted by this assay, and inadequate number of viral copies (<131 copies/mL). A negative result must be combined with clinical observations, patient history, and epidemiological information. The expected result is Negative. Fact Sheet for Patients:  PinkCheek.be Fact Sheet for Healthcare Providers:  GravelBags.it This test is not yet ap proved or cleared by the [place] FDA and  has been authorized for detection and/or diagnosis of SARS-CoV-2 by FDA under an Emergency Use Authorization (EUA). This EUA will remain  in effect (meaning this test can be used) for the duration of the COVID-19 declaration under Section 564(b)(1) of the Act, 21 U.S.C. section 360bbb-3(b)(1), unless the authorization is terminated or revoked sooner.     Influenza A by PCR NEGATIVE NEGATIVE Final   Influenza B by PCR NEGATIVE NEGATIVE Final    Comment: (NOTE) The Xpert Xpress SARS-CoV-2/FLU/RSV assay is intended as an aid in  the diagnosis of influenza from Nasopharyngeal swab specimens and  should not be used as a sole basis for treatment. Nasal washings and  aspirates are unacceptable for Xpert Xpress SARS-CoV-2/FLU/RSV  testing. Fact Sheet for Patients: PinkCheek.be Fact Sheet for Healthcare Providers: GravelBags.it This test is not yet approved or cleared by the [place] FDA and  has been authorized for detection and/or diagnosis of SARS-CoV-2 by  FDA under an Emergency Use Authorization (EUA). This EUA will remain  in effect (meaning this test can be used) for the duration of the  Covid-19 declaration under Section 564(b)(1) of the Act, 21  U.S.C. section 360bbb-3(b)(1), unless the authorization is  terminated or revoked. Performed at [facility], [address], [place], [place] [address]          Radiology Studies: [facility] Chest [place]  1 View  Result Date: 01/17/2020 CLINICAL DATA:  Shortness of breath EXAM: PORTABLE CHEST 1 VIEW COMPARISON:  11/19/2019 FINDINGS: Cardiac shadow is mildly enlarged but stable. Defibrillator is again seen. Vascular congestion is noted with changes of pulmonary edema increased from the prior exam. No sizable effusion is noted. No bony abnormality is seen. IMPRESSION: Increasing pulmonary edema when compare with the prior study. Electronically Signed   By: Inez Catalina M.D.   On: 01/17/2020 02:38        Scheduled Meds: . aspirin EC  81 mg Oral Q breakfast  . busPIRone  10 mg Oral TID  . furosemide  40 mg Intravenous Q12H  . heparin injection (subcutaneous)  5,000 Units Subcutaneous Q8H  . insulin aspart  0-15 Units Subcutaneous TID WC  . insulin aspart  0-5 Units Subcutaneous QHS  . insulin glargine  50 Units Subcutaneous Q2200  .  metoprolol succinate  25 mg Oral Daily  . mometasone-formoterol  2 puff Inhalation BID  . multivitamin with minerals  1 tablet Oral Daily  . potassium chloride SA  20 mEq Oral Daily  . rosuvastatin  40 mg Oral Daily  . sertraline  25 mg Oral Daily  . sodium chloride flush  3 mL Intravenous Q12H  . spironolactone  25 mg Oral Daily  . umeclidinium bromide  1 puff Inhalation Daily   Continuous Infusions: . sodium chloride       LOS: 0 days       Ronda Rajkumar Luther Parody, MD [facility]   If 7PM-7AM, please contact night-coverage [email or web address]  01/17/2020, 7:51 AM

## 2020-01-17 NOTE — ED Provider Notes (Signed)
[facility] EMERGENCY DEPARTMENT Provider Note   CSN: [id number] Arrival date & time: 01/17/20  0211   Time seen 2:05 AM on arrival  History Chief Complaint  Patient presents with  . Shortness of Breath   Level 5 caveat for respiratory distress  Antonio Cobb is a 59 y.o. male.  HPI   EMS reports patient called for shortness of breath started about 1:30 AM.  He had tried his albuterol nebulizer without improvement.  EMS reported his blood pressure was 220/100 and they gave him nitroglycerin x2.  They also started him on CPAP.  On arrival patient was awake but he would not or could not answer even yes or no questions.  PCP [name], MD   Past Medical History:  Diagnosis Date  . Alcohol abuse   . CAD (coronary artery disease)    a. s/p inferolateral STEMI in 05/2012 with DES to LCx b. patent stent by cath in 05/2016 with moderate RCA and LAD disease  . Cardiomyopathy ([place])    a. LVEF 30-35% by echo in 09/2016. b. 08/2017: echo showing EF remains reduced at 25-30% c. s/p [employer] ICD placement in 01/2018  . CHF (congestive heart failure) ([place])   . COPD (chronic obstructive pulmonary disease) ([place])   . Diabetes mellitus, type 2 ([place])   . Essential hypertension   . Mitral regurgitation    Moderate  . Mixed hyperlipidemia   . Myocardial infarction ([place]) 05/23/2012   Inferolateral STEMI  . Obesity     Patient Active Problem List   Diagnosis Date Noted  . Encounter for support and coordination of transition of care 11/30/2019  . CAD (coronary artery disease)- s/p STEMI with DES to Lt Cir in 2013/S/p AICD 01/2018 11/19/2019  . Ischemic cardiomyopathy with implantable cardioverter-defibrillator (ICD)/s/p STEMI with DES to Lt Cir in 2013/S/p AICD 01/2018 11/19/2019  . Anxiety attack/panic attacks 11/19/2019  . CHF exacerbation ([place]) 11/18/2019  . Flash pulmonary edema ([place]) 11/13/2019  . Uncontrolled type 1 diabetes mellitus with hyperglycemia ([place])  10/11/2019  . Hospital discharge follow-up 04/15/2019  . Pulmonary edema 04/05/2019  . Muscle pain 02/28/2018  . Chronic systolic (congestive) heart failure ([place]) 02/01/2018  . DM type 2 causing vascular disease ([place]) 10/18/2017  . Personal history of noncompliance with medical treatment, presenting hazards to health 10/18/2017  . Lumbar degenerative disc disease 06/20/2017  . Chronic systolic heart failure ([place]) 11/01/2016  . Acute respiratory failure with hypoxia ([place])   . AKI (acute kidney injury) ([place]) 10/07/2016  . Prolonged QT interval 10/07/2016  . Coronary artery disease involving coronary bypass graft of native heart with angina pectoris ([place])   . Abnormal echocardiogram   . Acute on chronic combined systolic and diastolic CHF (congestive heart failure) ([place])   . Allergic rhinitis 05/06/2015  . Alcohol abuse 05/23/2012  . Arteriosclerotic cardiovascular disease (ASCVD) 05/23/2012  . ED (erectile dysfunction) 05/10/2012  . Overweight with body mass index (BMI) of 29 to 29.9 in adult 08/12/2010  . Insulin-requiring or dependent type II diabetes mellitus ([place]) 03/08/2008  . Mixed hyperlipidemia 03/08/2008  . Essential hypertension 03/08/2008    Past Surgical History:  Procedure Laterality Date  . CARDIAC CATHETERIZATION  2 yrs ago  . CARDIAC CATHETERIZATION N/A 05/15/2016   Procedure: Right/Left Heart Cath and Coronary Angiography;  Surgeon: [name], MD;  Location: [facility];  Service: Cardiovascular;  Laterality: N/A;  . CORONARY STENT PLACEMENT  05/23/12  . ICD IMPLANT N/A 02/01/2018  Procedure: ICD IMPLANT;  Surgeon: Evans Lance, MD;  Location: [facility];  Service: Cardiovascular;  Laterality: N/A;  . LEFT HEART CATHETERIZATION WITH CORONARY ANGIOGRAM N/A 05/23/2012   Procedure: LEFT HEART CATHETERIZATION WITH CORONARY ANGIOGRAM;  Surgeon: [name], MD;  Location: [facility] CATH LAB;  Service: Cardiovascular;  Laterality: N/A;  . None    .  PERCUTANEOUS CORONARY STENT INTERVENTION (PCI-S) N/A 05/23/2012   Procedure: PERCUTANEOUS CORONARY STENT INTERVENTION (PCI-S);  Surgeon: [name], MD;  Location: [facility] CATH LAB;  Service: Cardiovascular;  Laterality: N/A;  . POLYPECTOMY  10/16/2011   Procedure: POLYPECTOMY;  Surgeon: [name], MD;  Location: [facility] ORS;  Service: Endoscopy;;  Polypoid Lesion, Transverse and Sigmoid Colon       Family History  Problem Relation Age of Onset  . Emphysema Mother   . Heart attack Mother   . Hypertension Mother   . Diabetes Mother   . Heart disease Mother   . COPD Mother   . Emphysema Father   . COPD Father   . Stroke Sister   . Asthma Sister   . Colon cancer Neg Hx   . Anesthesia problems Neg Hx   . Hypotension Neg Hx   . Malignant hyperthermia Neg Hx   . Pseudochol deficiency Neg Hx     Social History   Tobacco Use  . Smoking status: Former Smoker    Packs/day: 0.25    Years: 30.00    Pack years: 7.50    Types: Cigars    Quit date: 05/23/2016    Years since quitting: 3.6  . Smokeless tobacco: Never Used  . Tobacco comment: since early 57s  Substance Use Topics  . Alcohol use: Yes    Alcohol/week: 0.0 standard drinks    Comment: occ  . Drug use: No    Home Medications Prior to Admission medications   Medication Sig Start Date End Date Taking? Authorizing Provider  albuterol (VENTOLIN HFA) 108 (90 Base) MCG/ACT inhaler Inhale 2 puffs into the lungs every 6 (six) hours as needed for wheezing or shortness of breath. 11/20/19   Roxan Hockey, MD  ALPRAZolam Duanne Moron) 0.25 MG tablet Take 1 tablet (0.25 mg total) by mouth at bedtime as needed for anxiety or sleep (As needed for anxiety/insomnia/panic attack). 11/20/19   Roxan Hockey, MD  aspirin EC 81 MG EC tablet Take 1 tablet (81 mg total) by mouth daily with breakfast. 11/20/19   Emokpae, Courage, MD  busPIRone (BUSPAR) 10 MG tablet Take 1 tablet (10 mg total) by mouth 3 (three) times daily. For anxiety 11/20/19    Roxan Hockey, MD  glucose blood (FREESTYLE LITE) test strip TEST FOUR TIMES DAILY 10/05/19   [name], MD  hydrOXYzine (ATARAX/VISTARIL) 25 MG tablet Take one tablet by mouth at bedtime, for anxiety and sleep 12/04/19   [name], MD  insulin regular (NOVOLIN R,HUMULIN R) 100 units/mL injection Inject 0.1 mLs (10 Units total) into the skin 3 (three) times daily before meals. 03/27/19   Cassandria Anger, MD  ipratropium-albuterol (DUONEB) 0.5-2.5 (3) MG/3ML SOLN Take 3 mLs by nebulization every 6 (six) hours as needed. 11/20/19   Roxan Hockey, MD  LANTUS SOLOSTAR 100 UNIT/ML Solostar Pen ADMINISTER 50 UNITS UNDER THE SKIN EVERY NIGHT AT BEDTIME 08/14/19   Cassandria Anger, MD  losartan (COZAAR) 25 MG tablet Take 1 tablet (25 mg total) by mouth daily. 11/20/19   Roxan Hockey, MD  metoprolol succinate (TOPROL-XL) 50 MG 24 hr tablet  TAKE 1 TABLET BY MOUTH EVERY DAY WITH FOOD 11/20/19   Roxan Hockey, MD  mometasone-formoterol (DULERA) 100-5 MCG/ACT AERO Inhale 2 puffs into the lungs 2 (two) times daily. 11/20/19   Roxan Hockey, MD  Multiple Vitamins-Minerals (MULTIVITAMIN WITH MINERALS) tablet Take 1 tablet by mouth daily. 11/20/19 11/19/20  Roxan Hockey, MD  nitroGLYCERIN (NITROSTAT) 0.4 MG SL tablet Place 1 tablet (0.4 mg total) under the tongue every 5 (five) minutes as needed for chest pain. 05/29/16 11/24/19  Lendon Colonel, NP  potassium chloride SA (KLOR-CON) 20 MEQ tablet Take 1 tablet (20 mEq total) by mouth daily. 11/20/19   Roxan Hockey, MD  rosuvastatin (CRESTOR) 40 MG tablet Take 1 tablet (40 mg total) by mouth daily. 11/20/19   Roxan Hockey, MD  sertraline (ZOLOFT) 25 MG tablet Take 1 tablet (25 mg total) by mouth daily. For anxiety/panic attacks 11/20/19 11/19/20  Roxan Hockey, MD  spironolactone (ALDACTONE) 25 MG tablet Take 1 tablet (25 mg total) by mouth daily. 11/20/19   Roxan Hockey, MD  torsemide (DEMADEX) 20 MG tablet Take  2 tablets (40 mg total) by mouth daily. 11/24/19   Strader, Fransisco Hertz, PA-C  umeclidinium bromide (INCRUSE ELLIPTA) 62.5 MCG/INH AEPB Inhale 1 puff into the lungs daily. 11/20/19   Roxan Hockey, MD    Allergies    Entresto [sacubitril-valsartan] and Other  Review of Systems   Review of Systems  All other systems reviewed and are negative.   Physical Exam Updated Vital Signs SpO2 100%   Physical Exam Vitals and nursing note reviewed.  Constitutional:      General: He is in acute distress.     Appearance: He is obese.  HENT:     Head: Normocephalic and atraumatic.     Right Ear: External ear normal.     Left Ear: External ear normal.  Eyes:     Extraocular Movements: Extraocular movements intact.     Conjunctiva/sclera: Conjunctivae normal.     Pupils: Pupils are equal, round, and reactive to light.  Cardiovascular:     Rate and Rhythm: Regular rhythm. Tachycardia present.  Pulmonary:     Effort: Tachypnea, accessory muscle usage, prolonged expiration, respiratory distress and retractions present.     Breath sounds: Examination of the right-upper field reveals rales. Examination of the left-upper field reveals rales. Examination of the right-middle field reveals rales. Examination of the left-middle field reveals rales. Examination of the right-lower field reveals rales. Examination of the left-lower field reveals rales. Rales present.  Musculoskeletal:        General: No deformity.     Cervical back: Normal range of motion.     Comments: Trace pitting edema  Skin:    General: Skin is warm and dry.     Findings: No erythema.  Neurological:     Mental Status: He is alert.     Comments: Moves both arms equally unable to cooperate for full neuro exam  Psychiatric:     Comments: Patient responded to pain i.e. IV insertion by trying to withdrawal his arm     ED Results / Procedures / Treatments   Labs (all labs ordered are listed, but only abnormal results are  displayed) Results for orders placed or performed during the hospital encounter of 01/17/20  Comprehensive metabolic panel  Result Value Ref Range   Sodium 136 135 - 145 mmol/L   Potassium 3.3 (L) 3.5 - 5.1 mmol/L   Chloride 99 98 - 111 mmol/L   CO2 25 22 -  32 mmol/L   Glucose, Bld 361 (H) 70 - 99 mg/dL   BUN 27 (H) 6 - 20 mg/dL   Creatinine, Ser 1.83 (H) 0.61 - 1.24 mg/dL   Calcium 8.8 (L) 8.9 - 10.3 mg/dL   Total Protein 7.6 6.5 - 8.1 g/dL   Albumin 3.9 3.5 - 5.0 g/dL   AST 26 15 - 41 U/L   ALT 27 0 - 44 U/L   Alkaline Phosphatase 110 38 - 126 U/L   Total Bilirubin 0.7 0.3 - 1.2 mg/dL   GFR calc non Af Amer 40 (L) >60 mL/min   GFR calc Af Amer 46 (L) >60 mL/min   Anion gap 12 5 - 15  Brain natriuretic peptide  Result Value Ref Range   B Natriuretic Peptide 517.0 (H) 0.0 - 100.0 pg/mL  CBC with Differential  Result Value Ref Range   WBC 17.0 (H) 4.0 - 10.5 K/uL   RBC 5.34 4.22 - 5.81 MIL/uL   Hemoglobin 15.7 13.0 - 17.0 g/dL   HCT 49.8 39.0 - 52.0 %   MCV 93.3 80.0 - 100.0 fL   MCH 29.4 26.0 - 34.0 pg   MCHC 31.5 30.0 - 36.0 g/dL   RDW 12.4 11.5 - 15.5 %   Platelets 305 150 - 400 K/uL   nRBC 0.0 0.0 - 0.2 %   Neutrophils Relative % 32 %   Neutro Abs 5.5 1.7 - 7.7 K/uL   Lymphocytes Relative 47 %   Lymphs Abs 8.0 (H) 0.7 - 4.0 K/uL   Monocytes Relative 8 %   Monocytes Absolute 1.3 (H) 0.1 - 1.0 K/uL   Eosinophils Relative 11 %   Eosinophils Absolute 1.9 (H) 0.0 - 0.5 K/uL   Basophils Relative 1 %   Basophils Absolute 0.2 (H) 0.0 - 0.1 K/uL   Immature Granulocytes 1 %   Abs Immature Granulocytes 0.09 (H) 0.00 - 0.07 K/uL  Troponin I (High Sensitivity)  Result Value Ref Range   Troponin I (High Sensitivity) 16 <18 ng/L   Laboratory interpretation all normal except leukocytosis, mild hypokalemia, hyperglycemia, renal insufficiency, mild elevation of BNP, leukocytosis     EKG EKG Interpretation  Date/Time:  Wednesday January 17 2020 02:20:22 EST Ventricular  Rate:  111 PR Interval:    QRS Duration: 82 QT Interval:  343 QTC Calculation: 467 R Axis:   8 Text Interpretation: Sinus tachycardia Ventricular premature complex Abnormal R-wave progression, early transition Nonspecific repol abnormality, diffuse leads No significant change since last tracing 18 Nov 2019 Confirmed by Rolland Porter [phone]) on 01/17/2020 2:39:47 AM   Radiology DG Chest Port 1 View  Result Date: 01/17/2020 CLINICAL DATA:  Shortness of breath EXAM: PORTABLE CHEST 1 VIEW COMPARISON:  11/19/2019 FINDINGS: Cardiac shadow is mildly enlarged but stable. Defibrillator is again seen. Vascular congestion is noted with changes of pulmonary edema increased from the prior exam. No sizable effusion is noted. No bony abnormality is seen. IMPRESSION: Increasing pulmonary edema when compare with the prior study. Electronically Signed   By: Inez Catalina M.D.   On: 01/17/2020 02:38    Procedures .Critical Care Performed by: Rolland Porter, MD Authorized by: Rolland Porter, MD   Critical care provider statement:    Critical care time (minutes):  39   Critical care was necessary to treat or prevent imminent or life-threatening deterioration of the following conditions:  Cardiac failure and respiratory failure   Critical care was time spent personally by me on the following activities:  Discussions with consultants, examination  of patient, obtaining history from patient or surrogate, ordering and review of laboratory studies, ordering and review of radiographic studies, pulse oximetry, re-evaluation of patient's condition and review of old charts   (including critical care time)  Medications Ordered in ED Medications  nitroGLYCERIN (NITROGLYN) 2 % ointment 1 inch (1 inch Topical Given 01/17/20 0222)  albuterol (PROVENTIL,VENTOLIN) solution continuous neb (10 mg/hr Nebulization Given 01/17/20 0222)  ipratropium (ATROVENT) nebulizer solution 0.5 mg (0.5 mg Nebulization Given 01/17/20 0222)  furosemide (LASIX)  injection 80 mg (80 mg Intravenous Given 01/17/20 0222)    ED Course  I have reviewed the triage vital signs and the nursing notes.  Pertinent labs & imaging results that were available during my care of the patient were reviewed by me and considered in my medical decision making (see chart for details).    MDM Rules/Calculators/A&P                      Patient was placed on our BiPAP by respiratory therapy.  He was given albuterol 10 mg plus Atrovent 0.5 mg nebulizer.  His initial blood pressure was [id number] systolic, nitroglycerin 1 inch was placed on his skin.  When I listen to him he had diffuse rales consistent with congestive heart failure, he was given Lasix 80 mg IV.  By the time he had been on our BiPAP for about 10 minutes patient indicated he was breathing better, he shook his head no that he was not having any chest pain.  2:40 AM patient states his breathing is getting better.  He continues on the BiPAP with a nebulizer.  He indicates he does not have to urinate yet.  Pulse ox is 96% on the BiPAP, his heart rate is 100, blood pressure is 150/98 with the nitroglycerin paste, his respiratory rate is 25.  3:25 AM respiratory therapist was in the room with patient.  He had wanted to trial off the BiPAP however when she took it off he got more short of breath.  He is currently back on the BiPAP.  She has cut down the respiratory rate in the pressures and he is much more comfortable now.  He still has not had any urinary output after the IV Lasix.  We discussed admission and he is agreeable.  3:46 AM Dr. Scherrie November, hospitalist will admit  Antonio Cobb was evaluated in Emergency Department on 01/17/2020 for the symptoms described in the history of present illness. He was evaluated in the context of the global COVID-19 pandemic, which necessitated consideration that the patient might be at risk for infection with the SARS-CoV-2 virus that causes COVID-19. Institutional protocols and algorithms  that pertain to the evaluation of patients at risk for COVID-19 are in a state of rapid change based on information released by regulatory bodies including the CDC and federal and state organizations. These policies and algorithms were followed during the patient's care in the ED.   Final Clinical Impression(s) / ED Diagnoses Final diagnoses:  Acute on chronic congestive heart failure, unspecified heart failure type ([place])  Hypoxia  Renal insufficiency    Rx / DC Orders   Plan admission  Rolland Porter, MD, Barbette Or, MD 01/17/20 [phone]

## 2020-01-18 ENCOUNTER — Inpatient Hospital Stay (HOSPITAL_COMMUNITY): Payer: BC Managed Care – PPO

## 2020-01-18 DIAGNOSIS — I10 Essential (primary) hypertension: Secondary | ICD-10-CM

## 2020-01-18 DIAGNOSIS — R0602 Shortness of breath: Secondary | ICD-10-CM | POA: Diagnosis not present

## 2020-01-18 DIAGNOSIS — I5043 Acute on chronic combined systolic (congestive) and diastolic (congestive) heart failure: Secondary | ICD-10-CM | POA: Diagnosis not present

## 2020-01-18 LAB — BASIC METABOLIC PANEL
Anion gap: 14 (ref 5–15)
BUN: 29 mg/dL — ABNORMAL HIGH (ref 6–20)
CO2: 29 mmol/L (ref 22–32)
Calcium: 9.6 mg/dL (ref 8.9–10.3)
Chloride: 100 mmol/L (ref 98–111)
Creatinine, Ser: 1.46 mg/dL — ABNORMAL HIGH (ref 0.61–1.24)
GFR calc Af Amer: >60 mL/min (ref >60)
GFR calc non Af Amer: 52 mL/min — ABNORMAL LOW (ref >60)
Glucose, Bld: 126 mg/dL — ABNORMAL HIGH (ref 70–99)
Potassium: 3.6 mmol/L (ref 3.5–5.1)
Sodium: 143 mmol/L (ref 135–145)

## 2020-01-18 LAB — GLUCOSE, CAPILLARY: Glucose-Capillary: 112 mg/dL — ABNORMAL HIGH (ref 70–99)

## 2020-01-18 MED ORDER — FUROSEMIDE 40 MG PO TABS: 40.0000 mg | ORAL_TABLET | Freq: Two times a day (BID) | ORAL | 1 refills | Status: DC

## 2020-01-18 NOTE — Progress Notes (Signed)
Patient alert and oriented x4. No complaints of pain, shortness of breath, chest pain, dizziness, nausea or vomiting. Patient up out of bed to chair with supervision, gait steady. Patient tolerated PO meds and diet well, appetite good. Wife in to visit, emotional support provided. Discharge instructions, appointment follow ups and medication education gone over with patient and wife. Education on CHF done with patient and wife. All questions were answered and both the patient and wife expressed full understanding of instructions, follow up appointments and education with teach back. IV's removed with no complications. Patient discharged home with all belongings for home via car. (Wife driving patient home).

## 2020-01-18 NOTE — Discharge Summary (Signed)
Physician Discharge Summary  MELQUIADES AGRICOLA [id number] DOB: 29-Jan-1961 DOA: 01/17/2020  PCP: [name], MD  Admit date: 01/17/2020 Discharge date: 01/18/2020  Admitted From: Home Disposition: Home  Recommendations for Outpatient Follow-up:  1. Follow up with PCP in 1-2 weeks 2. Please follow up on the following pending results:  Home Health: None Equipment/Devices: None  Discharge Condition: Stable CODE STATUS: Full code Diet recommendation: Heart healthy, carb modified  Brief/Interim Summary: This is a very pleasant 59 year old man with ischemic cardiomyopathy with ejection fraction of 25% who was admitted to the hospital with acute on chronic systolic congestive heart failure.  He was appropriately started on intravenous Lasix and for a short time had BiPAP but this was removed yesterday morning and he has done well maintaining saturations above 95% on room air.  He feels much improved.  On closer questioning, he was taking torsemide in the home setting but he tells me that he prefers Lasix which seems to help him better.  He is being discharged now on Lasix 40 mg twice a day and I have given him instructions on when to take these doses.  He will continue with the rest of the medications as before.  Discharge Diagnoses:  Active Problems:   Insulin-requiring or dependent type II diabetes mellitus ([place])   Mixed hyperlipidemia   Essential hypertension   Acute on chronic combined systolic and diastolic CHF (congestive heart failure) (HCC)   Coronary artery disease involving coronary bypass graft of native heart with angina pectoris (HCC)   Acute respiratory failure with hypoxia ([place])   Pulmonary edema   CHF exacerbation [facility])    Discharge Instructions  Discharge Instructions    Diet - low sodium heart healthy   Complete by: As directed    Discharge instructions   Complete by: As directed    Take Lasix 1 tablet at 8am and next dose Lasix 1 tablet between 12-2pm.   Increase activity slowly   Complete by: As directed      Allergies as of 01/18/2020      Reactions   Entresto [sacubitril-valsartan]    Dizziness, Coughing   Other    Seasonal allergies  - has to use inhaler      Medication List    STOP taking these medications   torsemide 20 MG tablet Commonly known as: Demadex     TAKE these medications   albuterol 108 (90 Base) MCG/ACT inhaler Commonly known as: VENTOLIN HFA Inhale 2 puffs into the lungs every 6 (six) hours as needed for wheezing or shortness of breath.   ALPRAZolam 0.25 MG tablet Commonly known as: XANAX Take 1 tablet (0.25 mg total) by mouth at bedtime as needed for anxiety or sleep (As needed for anxiety/insomnia/panic attack).   aspirin 81 MG EC tablet Take 1 tablet (81 mg total) by mouth daily with breakfast.   busPIRone 10 MG tablet Commonly known as: BUSPAR Take 1 tablet (10 mg total) by mouth 3 (three) times daily. For anxiety   FREESTYLE LITE test strip Generic drug: glucose blood TEST FOUR TIMES DAILY   furosemide 40 MG tablet Commonly known as: Lasix Take 1 tablet (40 mg total) by mouth 2 (two) times daily.   hydrOXYzine 25 MG tablet Commonly known as: ATARAX/VISTARIL Take one tablet by mouth at bedtime, for anxiety and sleep   Incruse Ellipta 62.5 MCG/INH Aepb Generic drug: umeclidinium bromide Inhale 1 puff into the lungs daily.   insulin regular 100 units/mL injection Commonly known as: NOVOLIN R  Inject 0.1 mLs (10 Units total) into the skin 3 (three) times daily before meals.   ipratropium-albuterol 0.5-2.5 (3) MG/3ML Soln Commonly known as: DUONEB Take 3 mLs by nebulization every 6 (six) hours as needed.   Lantus SoloStar 100 UNIT/ML Solostar Pen Generic drug: Insulin Glargine ADMINISTER 50 UNITS UNDER THE SKIN EVERY NIGHT AT BEDTIME   losartan 25 MG tablet Commonly known as: COZAAR Take 1 tablet (25 mg total) by mouth daily.   metoprolol succinate 50 MG 24 hr tablet Commonly known  as: TOPROL-XL TAKE 1 TABLET BY MOUTH EVERY DAY WITH FOOD   mometasone-formoterol 100-5 MCG/ACT Aero Commonly known as: DULERA Inhale 2 puffs into the lungs 2 (two) times daily.   multivitamin with minerals tablet Take 1 tablet by mouth daily.   nitroGLYCERIN 0.4 MG SL tablet Commonly known as: Nitrostat Place 1 tablet (0.4 mg total) under the tongue every 5 (five) minutes as needed for chest pain.   potassium chloride SA 20 MEQ tablet Commonly known as: KLOR-CON Take 1 tablet (20 mEq total) by mouth daily.   rosuvastatin 40 MG tablet Commonly known as: CRESTOR Take 1 tablet (40 mg total) by mouth daily.   sertraline 25 MG tablet Commonly known as: Zoloft Take 1 tablet (25 mg total) by mouth daily. For anxiety/panic attacks   spironolactone 25 MG tablet Commonly known as: ALDACTONE Take 1 tablet (25 mg total) by mouth daily.         Consultations:  None.   Procedures/Studies: DG CHEST PORT 1 VIEW  Result Date: 01/18/2020 CLINICAL DATA:  Shortness of breath. EXAM: PORTABLE CHEST 1 VIEW COMPARISON:  01/17/2020 FINDINGS: Interval significant improved lung aeration, likely resolving/resolved pulmonary edema. No pleural effusions. No pulmonary lesions. IMPRESSION: Resolving/resolved pulmonary edema. Electronically Signed   By: Marijo Sanes M.D.   On: 01/18/2020 05:35   DG Chest Port 1 View  Result Date: 01/17/2020 CLINICAL DATA:  Shortness of breath EXAM: PORTABLE CHEST 1 VIEW COMPARISON:  11/19/2019 FINDINGS: Cardiac shadow is mildly enlarged but stable. Defibrillator is again seen. Vascular congestion is noted with changes of pulmonary edema increased from the prior exam. No sizable effusion is noted. No bony abnormality is seen. IMPRESSION: Increasing pulmonary edema when compare with the prior study. Electronically Signed   By: Inez Catalina M.D.   On: 01/17/2020 02:38       Subjective: He feels much improved today ,no dyspnea.   Discharge Exam: Vitals:    01/18/20 0400 01/18/20 0500 01/18/20 0700 01/18/20 0701  BP: 128/77 131/69 (!) 140/93   Pulse: 84 84 86   Resp: 16 13  18   Temp:      TempSrc:      SpO2: 95% 91% 96%   Weight:  89.2 kg    Height:         He looks systemically well.  Hemodynamically stable.  Lung fields are entirely clear with no clinical evidence of decompensated heart failure at this point in time.  He is alert and orientated.  His chest x-ray was reviewed and the pulmonary edema has resolved.   The results of significant diagnostics from this hospitalization (including imaging, microbiology, ancillary and laboratory) are listed below for reference.     Microbiology: Recent Results (from the past 240 hour(s))  Respiratory Panel by RT PCR (Flu A&B, Covid) - Nasopharyngeal Swab     Status: None   Collection Time: 01/17/20  3:48 AM   Specimen: Nasopharyngeal Swab  Result Value Ref Range Status   SARS  Coronavirus 2 by RT PCR NEGATIVE NEGATIVE Final    Comment: (NOTE) SARS-CoV-2 target nucleic acids are NOT DETECTED. The SARS-CoV-2 RNA is generally detectable in upper respiratoy specimens during the acute phase of infection. The lowest concentration of SARS-CoV-2 viral copies this assay can detect is 131 copies/mL. A negative result does not preclude SARS-Cov-2 infection and should not be used as the sole basis for treatment or other patient management decisions. A negative result may occur with  improper specimen collection/handling, submission of specimen other than nasopharyngeal swab, presence of viral mutation(s) within the areas targeted by this assay, and inadequate number of viral copies (<131 copies/mL). A negative result must be combined with clinical observations, patient history, and epidemiological information. The expected result is Negative. Fact Sheet for Patients:  PinkCheek.be Fact Sheet for Healthcare Providers:  GravelBags.it This test  is not yet ap proved or cleared by the [place] FDA and  has been authorized for detection and/or diagnosis of SARS-CoV-2 by FDA under an Emergency Use Authorization (EUA). This EUA will remain  in effect (meaning this test can be used) for the duration of the COVID-19 declaration under Section 564(b)(1) of the Act, 21 U.S.C. section 360bbb-3(b)(1), unless the authorization is terminated or revoked sooner.    Influenza A by PCR NEGATIVE NEGATIVE Final   Influenza B by PCR NEGATIVE NEGATIVE Final    Comment: (NOTE) The Xpert Xpress SARS-CoV-2/FLU/RSV assay is intended as an aid in  the diagnosis of influenza from Nasopharyngeal swab specimens and  should not be used as a sole basis for treatment. Nasal washings and  aspirates are unacceptable for Xpert Xpress SARS-CoV-2/FLU/RSV  testing. Fact Sheet for Patients: PinkCheek.be Fact Sheet for Healthcare Providers: GravelBags.it This test is not yet approved or cleared by the [place] FDA and  has been authorized for detection and/or diagnosis of SARS-CoV-2 by  FDA under an Emergency Use Authorization (EUA). This EUA will remain  in effect (meaning this test can be used) for the duration of the  Covid-19 declaration under Section 564(b)(1) of the Act, 21  U.S.C. section 360bbb-3(b)(1), unless the authorization is  terminated or revoked. Performed at [facility], [address] [place], [place] [address]   MRSA PCR Screening     Status: None   Collection Time: 01/17/20  6:33 AM   Specimen: Nasopharyngeal  Result Value Ref Range Status   MRSA by PCR NEGATIVE NEGATIVE Final    Comment:        The GeneXpert MRSA Assay (FDA approved for NASAL specimens only), is one component of a comprehensive MRSA colonization surveillance program. It is not intended to diagnose MRSA infection nor to guide or monitor treatment for MRSA infections. Performed at [facility],  [address] [place], [place] [address]      Labs: BNP (last 3 results) Recent Labs    11/13/19 0457 11/18/19 0427 01/17/20 0226  BNP 252.0* 463.0* [id number]*   Basic Metabolic Panel: Recent Labs  Lab 01/17/20 0226 01/17/20 0509 01/18/20 0539  NA 136 140 143  K 3.3* 4.3 3.6  CL 99 97* 100  CO2 25 30 29   GLUCOSE 361* 336* 126*  BUN 27* 30* 29*  CREATININE 1.83* 1.74* 1.46*  CALCIUM 8.8* 9.5 9.6  MG  --  2.3  --    Liver Function Tests: Recent Labs  Lab 01/17/20 0226  AST 26  ALT 27  ALKPHOS 110  BILITOT 0.7  PROT 7.6  ALBUMIN 3.9   No results for input(s):  LIPASE, AMYLASE in the last 168 hours. No results for input(s): AMMONIA in the last 168 hours. CBC: Recent Labs  Lab 01/17/20 0226 01/17/20 0509  WBC 17.0* 13.3*  NEUTROABS 5.5  --   HGB 15.7 14.5  HCT 49.8 46.8  MCV 93.3 91.9  PLT 305 251   Cardiac Enzymes: No results for input(s): CKTOTAL, CKMB, CKMBINDEX, TROPONINI in the last 168 hours. BNP: Invalid input(s): POCBNP CBG: Recent Labs  Lab 01/17/20 0732 01/17/20 1129 01/17/20 1612 01/17/20 2040 01/18/20 0745  GLUCAP 310* 217* 273* 208* 112*   D-Dimer No results for input(s): DDIMER in the last 72 hours. Hgb A1c No results for input(s): HGBA1C in the last 72 hours. Lipid Profile No results for input(s): CHOL, HDL, LDLCALC, TRIG, CHOLHDL, LDLDIRECT in the last 72 hours. Thyroid function studies No results for input(s): TSH, T4TOTAL, T3FREE, THYROIDAB in the last 72 hours.  Invalid input(s): FREET3 Anemia work up No results for input(s): VITAMINB12, FOLATE, FERRITIN, TIBC, IRON, RETICCTPCT in the last 72 hours. Urinalysis    Component Value Date/Time   COLORURINE COLORLESS (A) 09/06/2017 0452   APPEARANCEUR CLEAR 09/06/2017 0452   LABSPEC 1.006 09/06/2017 0452   PHURINE 5.0 09/06/2017 0452   GLUCOSEU >=500 (A) 09/06/2017 0452   GLUCOSEU NEG mg/dL 03/04/2007 0829   HGBUR NEGATIVE 09/06/2017 0452   HGBUR negative 11/13/2010 1522    BILIRUBINUR NEGATIVE 09/06/2017 0452   KETONESUR NEGATIVE 09/06/2017 0452   PROTEINUR NEGATIVE 09/06/2017 0452   UROBILINOGEN 0.2 03/23/2015 1805   NITRITE NEGATIVE 09/06/2017 0452   LEUKOCYTESUR NEGATIVE 09/06/2017 0452   Sepsis Labs Invalid input(s): PROCALCITONIN,  WBC,  LACTICIDVEN Microbiology Recent Results (from the past 240 hour(s))  Respiratory Panel by RT PCR (Flu A&B, Covid) - Nasopharyngeal Swab     Status: None   Collection Time: 01/17/20  3:48 AM   Specimen: Nasopharyngeal Swab  Result Value Ref Range Status   SARS Coronavirus 2 by RT PCR NEGATIVE NEGATIVE Final    Comment: (NOTE) SARS-CoV-2 target nucleic acids are NOT DETECTED. The SARS-CoV-2 RNA is generally detectable in upper respiratoy specimens during the acute phase of infection. The lowest concentration of SARS-CoV-2 viral copies this assay can detect is 131 copies/mL. A negative result does not preclude SARS-Cov-2 infection and should not be used as the sole basis for treatment or other patient management decisions. A negative result may occur with  improper specimen collection/handling, submission of specimen other than nasopharyngeal swab, presence of viral mutation(s) within the areas targeted by this assay, and inadequate number of viral copies (<131 copies/mL). A negative result must be combined with clinical observations, patient history, and epidemiological information. The expected result is Negative. Fact Sheet for Patients:  PinkCheek.be Fact Sheet for Healthcare Providers:  GravelBags.it This test is not yet ap proved or cleared by the [place] FDA and  has been authorized for detection and/or diagnosis of SARS-CoV-2 by FDA under an Emergency Use Authorization (EUA). This EUA will remain  in effect (meaning this test can be used) for the duration of the COVID-19 declaration under Section 564(b)(1) of the Act, 21 U.S.C. section  360bbb-3(b)(1), unless the authorization is terminated or revoked sooner.    Influenza A by PCR NEGATIVE NEGATIVE Final   Influenza B by PCR NEGATIVE NEGATIVE Final    Comment: (NOTE) The Xpert Xpress SARS-CoV-2/FLU/RSV assay is intended as an aid in  the diagnosis of influenza from Nasopharyngeal swab specimens and  should not be used as a sole basis for treatment. Nasal  washings and  aspirates are unacceptable for Xpert Xpress SARS-CoV-2/FLU/RSV  testing. Fact Sheet for Patients: PinkCheek.be Fact Sheet for Healthcare Providers: GravelBags.it This test is not yet approved or cleared by the [place] FDA and  has been authorized for detection and/or diagnosis of SARS-CoV-2 by  FDA under an Emergency Use Authorization (EUA). This EUA will remain  in effect (meaning this test can be used) for the duration of the  Covid-19 declaration under Section 564(b)(1) of the Act, 21  U.S.C. section 360bbb-3(b)(1), unless the authorization is  terminated or revoked. Performed at [facility], [address] [place], [place] [address]   MRSA PCR Screening     Status: None   Collection Time: 01/17/20  6:33 AM   Specimen: Nasopharyngeal  Result Value Ref Range Status   MRSA by PCR NEGATIVE NEGATIVE Final    Comment:        The GeneXpert MRSA Assay (FDA approved for NASAL specimens only), is one component of a comprehensive MRSA colonization surveillance program. It is not intended to diagnose MRSA infection nor to guide or monitor treatment for MRSA infections. Performed at [facility], [address], [place], [place] [address]      Time coordinating discharge: 35 minutes.  SIGNED:   Doree Albee, MD  [facility] 01/18/2020, 7:57 AM   If 7PM-7AM, please contact night-coverage [email or web address]

## 2020-01-19 ENCOUNTER — Telehealth: Payer: Self-pay

## 2020-01-19 NOTE — Telephone Encounter (Signed)
OP:[id number] Attempted to contact patient to complete [facility] telephone call. No answer. Left message requesting call back. Will try again later. 1st attempt

## 2020-01-21 ENCOUNTER — Other Ambulatory Visit: Payer: Self-pay

## 2020-01-21 ENCOUNTER — Encounter: Payer: Self-pay | Admitting: Family Medicine

## 2020-01-21 ENCOUNTER — Inpatient Hospital Stay (HOSPITAL_COMMUNITY)
Admission: EM | Admit: 2020-01-21 | Discharge: 2020-01-25 | Disposition: A | Discharge status: Home / Self Care | Payer: BC Managed Care – PPO | Source: Home / Self Care | Attending: Family Medicine | Admitting: Family Medicine

## 2020-01-21 ENCOUNTER — Emergency Department (HOSPITAL_COMMUNITY): Payer: BC Managed Care – PPO

## 2020-01-21 ENCOUNTER — Encounter (HOSPITAL_COMMUNITY): Payer: Self-pay | Admitting: Emergency Medicine

## 2020-01-21 DIAGNOSIS — J811 Chronic pulmonary edema: Secondary | ICD-10-CM

## 2020-01-21 DIAGNOSIS — R058 Other specified cough: Secondary | ICD-10-CM | POA: Diagnosis present

## 2020-01-21 DIAGNOSIS — J9601 Acute respiratory failure with hypoxia: Secondary | ICD-10-CM | POA: Diagnosis not present

## 2020-01-21 DIAGNOSIS — J9602 Acute respiratory failure with hypercapnia: Secondary | ICD-10-CM

## 2020-01-21 DIAGNOSIS — R0602 Shortness of breath: Secondary | ICD-10-CM | POA: Diagnosis not present

## 2020-01-21 DIAGNOSIS — I110 Hypertensive heart disease with heart failure: Secondary | ICD-10-CM | POA: Diagnosis not present

## 2020-01-21 DIAGNOSIS — I509 Heart failure, unspecified: Secondary | ICD-10-CM

## 2020-01-21 DIAGNOSIS — G4733 Obstructive sleep apnea (adult) (pediatric): Secondary | ICD-10-CM | POA: Diagnosis present

## 2020-01-21 DIAGNOSIS — I5043 Acute on chronic combined systolic (congestive) and diastolic (congestive) heart failure: Secondary | ICD-10-CM | POA: Diagnosis present

## 2020-01-21 DIAGNOSIS — I160 Hypertensive urgency: Secondary | ICD-10-CM | POA: Diagnosis not present

## 2020-01-21 DIAGNOSIS — E1159 Type 2 diabetes mellitus with other circulatory complications: Secondary | ICD-10-CM | POA: Diagnosis present

## 2020-01-21 DIAGNOSIS — I2510 Atherosclerotic heart disease of native coronary artery without angina pectoris: Secondary | ICD-10-CM | POA: Diagnosis present

## 2020-01-21 DIAGNOSIS — R0600 Dyspnea, unspecified: Secondary | ICD-10-CM | POA: Diagnosis present

## 2020-01-21 DIAGNOSIS — R05 Cough: Secondary | ICD-10-CM | POA: Diagnosis present

## 2020-01-21 DIAGNOSIS — I10 Essential (primary) hypertension: Secondary | ICD-10-CM | POA: Diagnosis present

## 2020-01-21 HISTORY — DX: Heart failure, unspecified: I50.9

## 2020-01-21 LAB — CBC WITH DIFFERENTIAL/PLATELET
Abs Immature Granulocytes: 0.06 10*3/uL (ref 0.00–0.07)
Basophils Absolute: 0.2 10*3/uL — ABNORMAL HIGH (ref 0.0–0.1)
Basophils Relative: 1 %
Eosinophils Absolute: 1.5 10*3/uL — ABNORMAL HIGH (ref 0.0–0.5)
Eosinophils Relative: 11 %
HCT: 49.9 % (ref 39.0–52.0)
Hemoglobin: 15.4 g/dL (ref 13.0–17.0)
Immature Granulocytes: 1 %
Lymphocytes Relative: 39 %
Lymphs Abs: 5 10*3/uL — ABNORMAL HIGH (ref 0.7–4.0)
MCH: 28.4 pg (ref 26.0–34.0)
MCHC: 30.9 g/dL (ref 30.0–36.0)
MCV: 92.1 fL (ref 80.0–100.0)
Monocytes Absolute: 0.9 10*3/uL (ref 0.1–1.0)
Monocytes Relative: 7 %
Neutro Abs: 5.4 10*3/uL (ref 1.7–7.7)
Neutrophils Relative %: 41 %
Platelets: 282 10*3/uL (ref 150–400)
RBC: 5.42 MIL/uL (ref 4.22–5.81)
RDW: 12.2 % (ref 11.5–15.5)
WBC: 13 10*3/uL — ABNORMAL HIGH (ref 4.0–10.5)
nRBC: 0 % (ref 0.0–0.2)

## 2020-01-21 LAB — COMPREHENSIVE METABOLIC PANEL
ALT: 27 U/L (ref 0–44)
AST: 27 U/L (ref 15–41)
Albumin: 4 g/dL (ref 3.5–5.0)
Alkaline Phosphatase: 110 U/L (ref 38–126)
Anion gap: 15 (ref 5–15)
BUN: 27 mg/dL — ABNORMAL HIGH (ref 6–20)
CO2: 23 mmol/L (ref 22–32)
Calcium: 8.9 mg/dL (ref 8.9–10.3)
Chloride: 101 mmol/L (ref 98–111)
Creatinine, Ser: 1.57 mg/dL — ABNORMAL HIGH (ref 0.61–1.24)
GFR calc Af Amer: 55 mL/min — ABNORMAL LOW (ref >60)
GFR calc non Af Amer: 48 mL/min — ABNORMAL LOW (ref >60)
Glucose, Bld: 265 mg/dL — ABNORMAL HIGH (ref 70–99)
Potassium: 3.6 mmol/L (ref 3.5–5.1)
Sodium: 139 mmol/L (ref 135–145)
Total Bilirubin: 0.8 mg/dL (ref 0.3–1.2)
Total Protein: 8.1 g/dL (ref 6.5–8.1)

## 2020-01-21 LAB — RESPIRATORY PANEL BY RT PCR (FLU A&B, COVID)
Influenza A by PCR: NEGATIVE
Influenza B by PCR: NEGATIVE
SARS Coronavirus 2 by RT PCR: NEGATIVE

## 2020-01-21 LAB — GLUCOSE, CAPILLARY
Glucose-Capillary: 135 mg/dL — ABNORMAL HIGH (ref 70–99)
Glucose-Capillary: 160 mg/dL — ABNORMAL HIGH (ref 70–99)

## 2020-01-21 LAB — TROPONIN I (HIGH SENSITIVITY)
Troponin I (High Sensitivity): 12 ng/L (ref <18)
Troponin I (High Sensitivity): 31 ng/L — ABNORMAL HIGH (ref <18)

## 2020-01-21 LAB — BLOOD GAS, VENOUS
Acid-base deficit: 3.8 mmol/L — ABNORMAL HIGH (ref 0.0–2.0)
Bicarbonate: 18.4 mmol/L — ABNORMAL LOW (ref 20.0–28.0)
FIO2: 99
O2 Saturation: 40.2 %
Patient temperature: 37
pCO2, Ven: 64.5 mmHg — ABNORMAL HIGH (ref 44.0–60.0)
pH, Ven: 7.184 — CL (ref 7.250–7.430)
pO2, Ven: 32.1 mmHg (ref 32.0–45.0)

## 2020-01-21 LAB — HEMOGLOBIN A1C
Hgb A1c MFr Bld: 9.7 % — ABNORMAL HIGH (ref 4.8–5.6)
Mean Plasma Glucose: 231.69 mg/dL

## 2020-01-21 LAB — MAGNESIUM: Magnesium: 2.4 mg/dL (ref 1.7–2.4)

## 2020-01-21 LAB — BRAIN NATRIURETIC PEPTIDE: B Natriuretic Peptide: 300 pg/mL — ABNORMAL HIGH (ref 0.0–100.0)

## 2020-01-21 MED ORDER — SODIUM CHLORIDE 0.9 % IV SOLN: 250.0000 mL | INTRAVENOUS | Status: DC | PRN

## 2020-01-21 MED ORDER — ASPIRIN EC 81 MG PO TBEC
81.0000 mg | DELAYED_RELEASE_TABLET | Freq: Every day | ORAL | Status: DC
  Administered 2020-01-21 – 2020-01-25 (×5): 81 mg via ORAL
  Filled 2020-01-21 (×5): qty 1

## 2020-01-21 MED ORDER — NITROGLYCERIN 2 % TD OINT
1.0000 [in_us] | TOPICAL_OINTMENT | Freq: Once | TRANSDERMAL | Status: DC
  Filled 2020-01-21: qty 1

## 2020-01-21 MED ORDER — ROSUVASTATIN CALCIUM 20 MG PO TABS
40.0000 mg | ORAL_TABLET | Freq: Every day | ORAL | Status: DC
  Administered 2020-01-21 – 2020-01-25 (×5): 40 mg via ORAL
  Filled 2020-01-21 (×6): qty 2

## 2020-01-21 MED ORDER — ACETAMINOPHEN 325 MG PO TABS: 650.0000 mg | ORAL_TABLET | ORAL | Status: DC | PRN

## 2020-01-21 MED ORDER — ENOXAPARIN SODIUM 40 MG/0.4ML ~~LOC~~ SOLN
40.0000 mg | SUBCUTANEOUS | Status: DC
  Administered 2020-01-21 – 2020-01-25 (×5): 40 mg via SUBCUTANEOUS
  Filled 2020-01-21 (×5): qty 0.4

## 2020-01-21 MED ORDER — FUROSEMIDE 10 MG/ML IJ SOLN
100.0000 mg | Freq: Once | INTRAMUSCULAR | Status: AC
  Administered 2020-01-21: 07:00:00 100 mg via INTRAVENOUS
  Filled 2020-01-21: qty 12

## 2020-01-21 MED ORDER — INSULIN ASPART 100 UNIT/ML ~~LOC~~ SOLN: 0.0000 [IU] | Freq: Every day | SUBCUTANEOUS | Status: DC

## 2020-01-21 MED ORDER — INSULIN ASPART 100 UNIT/ML ~~LOC~~ SOLN
0.0000 [IU] | Freq: Three times a day (TID) | SUBCUTANEOUS | Status: DC
  Administered 2020-01-21: 2 [IU] via SUBCUTANEOUS

## 2020-01-21 MED ORDER — ALBUTEROL (5 MG/ML) CONTINUOUS INHALATION SOLN
10.0000 mg/h | INHALATION_SOLUTION | Freq: Once | RESPIRATORY_TRACT | Status: AC
  Administered 2020-01-21: 06:00:00 10 mg/h via RESPIRATORY_TRACT
  Filled 2020-01-21: qty 20

## 2020-01-21 MED ORDER — SODIUM CHLORIDE 0.9% FLUSH: 3.0000 mL | INTRAVENOUS | Status: DC | PRN

## 2020-01-21 MED ORDER — SODIUM CHLORIDE 0.9% FLUSH
3.0000 mL | Freq: Two times a day (BID) | INTRAVENOUS | Status: DC
  Administered 2020-01-21 – 2020-01-25 (×8): 3 mL via INTRAVENOUS

## 2020-01-21 MED ORDER — IPRATROPIUM BROMIDE 0.02 % IN SOLN
0.5000 mg | Freq: Once | RESPIRATORY_TRACT | Status: AC
  Administered 2020-01-21: 0.5 mg via RESPIRATORY_TRACT
  Filled 2020-01-21: qty 2.5

## 2020-01-21 MED ORDER — ALPRAZOLAM 0.25 MG PO TABS
0.2500 mg | ORAL_TABLET | Freq: Once | ORAL | Status: AC
  Administered 2020-01-21: 22:00:00 0.25 mg via ORAL
  Filled 2020-01-21: qty 1

## 2020-01-21 NOTE — ED Triage Notes (Signed)
Pt brought in by EMS with shortness of breath. Placed on Cpap by EMS for O2 sats in 50's. Pt also given 2 sl Nitro tabs en route.

## 2020-01-21 NOTE — Assessment & Plan Note (Signed)
Uncontrolled, managed by endo, increased compliance with treatment plan encouraged Updated lab needed at/ before next visit.

## 2020-01-21 NOTE — H&P (Signed)
TRH H&P   Patient Demographics:    Antonio Cobb, is a 59 y.o. male  MRN: [id number]   DOB - 09/28/1961  Admit Date - 01/21/2020  Outpatient Primary MD for the patient is [name], MD  Referring MD/NP/PA: Dr Tomi Bamberger  Outpatient Specialists: CArdiology Dr Tomi Bamberger  Patient coming from: Home  Chief Complaint  Patient presents with  . Shortness of Breath      HPI:    Antonio Cobb  is a 59 y.o. male, with medical history significant ofalcohol abuse, coronary artery disease status post STEMI with DES to left circumflex in 0000000, systolic heart failure with ejection fraction 25 to 30% and is status post ICD, COPD, type 2 diabetes mellitus poorly controlled and insulin-dependent, essential hypertension, hyperlipidemia, chronic kidney disease a stage IIIawho presented to the ER with worsening shortness of breath, patient with recent hospitalization, was discharged 01/18/2020, secondary to congestive heart failure episode, where he was discharged on Lasix oral, patient presents with shortness of breath, developed overnight, no relief with using his albuterol inhaler, he was found to be hypoxic by EMS saturating 58% on room air, respiratory rate of 40, with elevated blood pressure, [id number] systolic, patient did receive nitroglycerin spray x2, and he was started on CPAP, upon presentation to ED, he was saturating 96% on CPAP, and blood pressure has improved to 156, patient denies any chest pain, ports compliance with medication, fever, no chills, reports some cough. - in ED work-up significant for creatinine of 1.57, his VBG showing pH of 7.18, PCO2 of 64, BNP of 300, white blood cell count of 13, patient appears currently comfortable on BiPAP 40% FiO2, patient received 100 mg of IV Lasix, blood pressure soft after the 6, I was called to admit.    Review of systems:    In addition to  the HPI above,  No Fever-chills, No Headache, No changes with Vision or hearing, No problems swallowing food or Liquids, No Chest pain, he reports  Cough and  Shortness of Breath, No Abdominal pain, No Nausea or Vommitting, Bowel movements are regular, No Blood in stool or Urine, No dysuria, No new skin rashes or bruises, No new joints pains-aches,  No new weakness, tingling, numbness in any extremity, No recent weight gain or loss, No polyuria, polydypsia or polyphagia, No significant Mental Stressors.  A full 10 point Review of Systems was done, except as stated above, all other Review of Systems were negative.   With Past History of the following :    Past Medical History:  Diagnosis Date  . Alcohol abuse   . CAD (coronary artery disease)    a. s/p inferolateral STEMI in 05/2012 with DES to LCx b. patent stent by cath in 05/2016 with moderate RCA and LAD disease  . Cardiomyopathy ([place])    a. LVEF 30-35% by echo in 09/2016. b. 08/2017: echo showing EF remains reduced  at 25-30% c. s/p [employer] ICD placement in 01/2018  . CHF (congestive heart failure) ([place])   . COPD (chronic obstructive pulmonary disease) ([place])   . Diabetes mellitus, type 2 ([place])   . Essential hypertension   . Mitral regurgitation    Moderate  . Mixed hyperlipidemia   . Myocardial infarction ([place]) 05/23/2012   Inferolateral STEMI  . Obesity       Past Surgical History:  Procedure Laterality Date  . CARDIAC CATHETERIZATION  2 yrs ago  . CARDIAC CATHETERIZATION N/A 05/15/2016   Procedure: Right/Left Heart Cath and Coronary Angiography;  Surgeon: [name], MD;  Location: [place] [facility];  Service: Cardiovascular;  Laterality: N/A;  . CORONARY STENT PLACEMENT  05/23/12  . ICD IMPLANT N/A 02/01/2018   Procedure: ICD IMPLANT;  Surgeon: Evans Lance, MD;  Location: [place] [facility];  Service: Cardiovascular;  Laterality: N/A;  . LEFT HEART CATHETERIZATION WITH CORONARY ANGIOGRAM N/A  05/23/2012   Procedure: LEFT HEART CATHETERIZATION WITH CORONARY ANGIOGRAM;  Surgeon: [name], MD;  Location: [facility] CATH LAB;  Service: Cardiovascular;  Laterality: N/A;  . None    . PERCUTANEOUS CORONARY STENT INTERVENTION (PCI-S) N/A 05/23/2012   Procedure: PERCUTANEOUS CORONARY STENT INTERVENTION (PCI-S);  Surgeon: [name], MD;  Location: [facility] C[facility] LAB;  Service: Cardiovascular;  Laterality: N/A;  . POLYPECTOMY  10/16/2011   Procedure: POLYPECTOMY;  Surgeon: [name], MD;  Location: [facility];  Service: Endoscopy;;  Polypoid Lesion, Transverse and Sigmoid Colon      Social History:     Social History   Tobacco Use  . Smoking status: Former Smoker    Packs/day: 0.25    Years: 30.00    Pack years: 7.50    Types: Cigars    Quit date: 05/23/2016    Years since quitting: 3.6  . Smokeless tobacco: Never Used  . Tobacco comment: since early 13s  Substance Use Topics  . Alcohol use: Yes    Alcohol/week: 0.0 standard drinks    Comment: occ     Family History :     Family History  Problem Relation Age of Onset  . Emphysema Mother   . Heart attack Mother   . Hypertension Mother   . Diabetes Mother   . Heart disease Mother   . COPD Mother   . Emphysema Father   . COPD Father   . Stroke Sister   . Asthma Sister   . Colon cancer Neg Hx   . Anesthesia problems Neg Hx   . Hypotension Neg Hx   . Malignant hyperthermia Neg Hx   . Pseudochol deficiency Neg Hx       Home Medications:   Prior to Admission medications   Medication Sig Start Date End Date Taking? Authorizing Provider  albuterol (VENTOLIN HFA) 108 (90 Base) MCG/ACT inhaler Inhale 2 puffs into the lungs every 6 (six) hours as needed for wheezing or shortness of breath. 11/20/19   Roxan Hockey, MD  ALPRAZolam Duanne Moron) 0.25 MG tablet Take 1 tablet (0.25 mg total) by mouth at bedtime as needed for anxiety or sleep (As needed for anxiety/insomnia/panic attack). 11/20/19   Roxan Hockey, MD  aspirin EC 81  MG EC tablet Take 1 tablet (81 mg total) by mouth daily with breakfast. 11/20/19   Emokpae, Courage, MD  busPIRone (BUSPAR) 10 MG tablet Take 1 tablet (10 mg total) by mouth 3 (three) times daily. For anxiety 11/20/19   Roxan Hockey, MD  furosemide (LASIX) 40  MG tablet Take 1 tablet (40 mg total) by mouth 2 (two) times daily. 01/18/20 01/17/21  Doree Albee, MD  glucose blood (FREESTYLE LITE) test strip TEST FOUR TIMES DAILY 10/05/19   [name], MD  hydrOXYzine (ATARAX/VISTARIL) 25 MG tablet Take one tablet by mouth at bedtime, for anxiety and sleep 12/04/19   [name], MD  insulin regular (NOVOLIN R,HUMULIN R) 100 units/mL injection Inject 0.1 mLs (10 Units total) into the skin 3 (three) times daily before meals. 03/27/19   Cassandria Anger, MD  ipratropium-albuterol (DUONEB) 0.5-2.5 (3) MG/3ML SOLN Take 3 mLs by nebulization every 6 (six) hours as needed. 11/20/19   Roxan Hockey, MD  LANTUS SOLOSTAR 100 UNIT/ML Solostar Pen ADMINISTER 50 UNITS UNDER THE SKIN EVERY NIGHT AT BEDTIME 08/14/19   Cassandria Anger, MD  losartan (COZAAR) 25 MG tablet Take 1 tablet (25 mg total) by mouth daily. 11/20/19   Roxan Hockey, MD  metoprolol succinate (TOPROL-XL) 50 MG 24 hr tablet TAKE 1 TABLET BY MOUTH EVERY DAY WITH FOOD 11/20/19   Roxan Hockey, MD  mometasone-formoterol (DULERA) 100-5 MCG/ACT AERO Inhale 2 puffs into the lungs 2 (two) times daily. 11/20/19   Roxan Hockey, MD  Multiple Vitamins-Minerals (MULTIVITAMIN WITH MINERALS) tablet Take 1 tablet by mouth daily. 11/20/19 11/19/20  Roxan Hockey, MD  nitroGLYCERIN (NITROSTAT) 0.4 MG SL tablet Place 1 tablet (0.4 mg total) under the tongue every 5 (five) minutes as needed for chest pain. 05/29/16 11/24/19  Lendon Colonel, NP  potassium chloride SA (KLOR-CON) 20 MEQ tablet Take 1 tablet (20 mEq total) by mouth daily. 11/20/19   Roxan Hockey, MD  rosuvastatin (CRESTOR) 40 MG tablet Take 1 tablet (40 mg total)  by mouth daily. 11/20/19   Roxan Hockey, MD  sertraline (ZOLOFT) 25 MG tablet Take 1 tablet (25 mg total) by mouth daily. For anxiety/panic attacks 11/20/19 11/19/20  Roxan Hockey, MD  spironolactone (ALDACTONE) 25 MG tablet Take 1 tablet (25 mg total) by mouth daily. 11/20/19   Roxan Hockey, MD  umeclidinium bromide (INCRUSE ELLIPTA) 62.5 MCG/INH AEPB Inhale 1 puff into the lungs daily. 11/20/19   Roxan Hockey, MD     Allergies:     Allergies  Allergen Reactions  . Entresto [Sacubitril-Valsartan]     Dizziness, Coughing  . Other     Seasonal allergies  - has to use inhaler     Physical Exam:   Vitals  Blood pressure 95/72, pulse 96, temperature 98.1 F (36.7 C), temperature source Oral, resp. rate (!) 21, height 5\' 10"  (1.778 m), weight 89.2 kg, SpO2 99 %.   1. General developed male, laying comfortably in bed now he is on BiPAP  2. Normal affect and insight, Not Suicidal or Homicidal, Awake Alert, Oriented X 3.  3. No F.N deficits, ALL C.Nerves Intact, Strength 5/5 all 4 extremities, Sensation intact all 4 extremities, Plantars down going.  4. Ears and Eyes appear Normal, Conjunctivae clear, PERRLA. Moist Oral Mucosa.  5. Supple Neck, No JVD, No cervical lymphadenopathy appriciated, No Carotid Bruits.  6. Symmetrical Chest wall movement, Good air movement bilaterally, bibasilar rales.  7. RRR, No Gallops, Rubs or Murmurs, No Parasternal Heave.  8. Positive Bowel Sounds, Abdomen Soft, No tenderness, No organomegaly appriciated,No rebound -guarding or rigidity.  9.  No Cyanosis, Normal Skin Turgor, No Skin Rash or Bruise.  10. Good muscle tone,  joints appear normal , no effusions, Normal ROM.  11. No Palpable Lymph Nodes in Neck or Axillae  Data Review:    CBC Recent Labs  Lab 01/17/20 0226 01/17/20 0509 01/21/20 0615  WBC 17.0* 13.3* 13.0*  HGB 15.7 14.5 15.4  HCT 49.8 46.8 49.9  PLT 305 251 282  MCV 93.3 91.9 92.1  MCH 29.4 28.5 28.4  MCHC  31.5 31.0 30.9  RDW 12.4 12.3 12.2  LYMPHSABS 8.0*  --  5.0*  MONOABS 1.3*  --  0.9  EOSABS 1.9*  --  1.5*  BASOSABS 0.2*  --  0.2*   ------------------------------------------------------------------------------------------------------------------  Chemistries  Recent Labs  Lab 01/17/20 0226 01/17/20 0509 01/18/20 0539 01/21/20 0615  NA 136 140 143 139  K 3.3* 4.3 3.6 3.6  CL 99 97* 100 101  CO2 25 30 29 23   GLUCOSE 361* 336* 126* 265*  BUN 27* 30* 29* 27*  CREATININE 1.83* 1.74* 1.46* 1.57*  CALCIUM 8.8* 9.5 9.6 8.9  MG  --  2.3  --  2.4  AST 26  --   --  27  ALT 27  --   --  27  ALKPHOS 110  --   --  110  BILITOT 0.7  --   --  0.8   ------------------------------------------------------------------------------------------------------------------ estimated creatinine clearance is 57 mL/min (A) (by C-G formula based on SCr of 1.57 mg/dL (H)). ------------------------------------------------------------------------------------------------------------------ No results for input(s): TSH, T4TOTAL, T3FREE, THYROIDAB in the last 72 hours.  Invalid input(s): FREET3  Coagulation profile No results for input(s): INR, PROTIME in the last 168 hours. ------------------------------------------------------------------------------------------------------------------- No results for input(s): DDIMER in the last 72 hours. -------------------------------------------------------------------------------------------------------------------  Cardiac Enzymes No results for input(s): CKMB, TROPONINI, MYOGLOBIN in the last 168 hours.  Invalid input(s): CK ------------------------------------------------------------------------------------------------------------------    Component Value Date/Time   BNP 300.0 (H) 01/21/2020 0615     ---------------------------------------------------------------------------------------------------------------  Urinalysis    Component Value Date/Time    COLORURINE COLORLESS (A) 09/06/2017 0452   APPEARANCEUR CLEAR 09/06/2017 0452   LABSPEC 1.006 09/06/2017 0452   PHURINE 5.0 09/06/2017 0452   GLUCOSEU >=500 (A) 09/06/2017 0452   GLUCOSEU NEG mg/dL 03/04/2007 0829   HGBUR NEGATIVE 09/06/2017 0452   HGBUR negative 11/13/2010 1522   BILIRUBINUR NEGATIVE 09/06/2017 0452   KETONESUR NEGATIVE 09/06/2017 0452   PROTEINUR NEGATIVE 09/06/2017 0452   UROBILINOGEN 0.2 03/23/2015 1805   NITRITE NEGATIVE 09/06/2017 0452   LEUKOCYTESUR NEGATIVE 09/06/2017 0452    ----------------------------------------------------------------------------------------------------------------   Imaging Results:    DG Chest Port 1 View  Result Date: 01/21/2020 CLINICAL DATA:  59 year old male with hypoxia and shortness of breath. EXAM: PORTABLE CHEST 1 VIEW COMPARISON:  Portable chest 01/18/2020 and earlier. FINDINGS: Portable AP upright view at 0624 hours. Stable lung volumes and mediastinal contours. Stable left chest AICD. Stable ventilation since yesterday with residual bilateral interstitial opacity, improved compared to 01/17/2020. Superimposed chronic right upper lobe scarring. No pneumothorax. No pleural effusion is evident. Visualized tracheal air column is within normal limits. Negative visible bowel gas pattern. No acute osseous abnormality identified. IMPRESSION: Stable chest since yesterday, decreased but not resolved diffuse pulmonary interstitial opacity since 01/17/2020. Electronically Signed   By: Genevie Ann M.D.   On: 01/21/2020 06:45    My personal review of EKG: Rhythm NSR, Rate  103 /min, QTc 439 , no Acute ST changes   Assessment & Plan:    Active Problems:   Essential hypertension   Acute on chronic combined systolic and diastolic CHF (congestive heart failure) (H[facility])   Acute respiratory failure with hypoxia ([facility])   DM type 2 causing vascular disease ([place])  CAD (coronary artery disease)- s/p STEMI with DES to Lt Cir in 2013/S/p AICD 01/2018    Acute CHF (congestive heart failure) ([place])  Acute hypoxic/hypercarbic respiratory failure -Patient saturating 58% on room air at his house, as well VBG showing PCO2 of 64, currently on BiPAP, continue with BiPAP support, he received a dose Lasix in ED, trauma of BiPAP in couple hours after he is appropriately diuresed. -This is most likely in the setting of flash pulmonary edema secondary to hypertensive urgency.  Acute exacerbation of chronic congestive heart failure with reduced ejection fraction: - S/p AICD placement -Patient with recent admission 12/3-12/4, similar presentation, likely in setting of flash pulmonary edema secondary to hypertensive urgency, and acute systolic CHF. - Echocardiogram on 11/13/2019 which showed EF of 25%, severely decreased LV function, moderately dilated LV cavity. -We will continue with IV Lasix once blood pressure is improved. -For now Aldactone, metoprolol and losartan will be held given soft blood pressure. -Continue with daily weights, strict ins and outs. -Consult cardiology input given recurrent admissions.  Coronary artery disease status post and placement: -Continue with aspirin -He denies any chest pain, EKG nonacute, will cycle troponins, anticipate it will be elevated secondary to demand ischemia, but will continue to follow the trend.  Hypertension: -Soft blood pressure will hold medications  Hyperlipidemia: Continue Crestor  Insulin-dependent diabetes mellitus type 2: -Uncontrolled, most recent A1c 10.2%. -Continue home dose of Lantus -We will place on sliding scale insulin coverage with fingerstick monitoring  History of alcohol abuse: -Counseled about cessation   chronic kidney disease stage III: -Monitor renal function closely  COPD: -No active wheezing  DVT Prophylaxis  Lovenox  AM Labs Ordered, also please review Full Orders  Family Communication: Admission, patients condition and plan of care including tests being  ordered have been discussed with the patient  who indicate understanding and agree with the plan and Code Status.  Code Status Full  Likely DC to  Home  Condition GUARDED    Consults called: cards consult in am  Admission status: Inpatient  Time spent in minutes : 60 minutes   Phillips Climes M.D on 01/21/2020 at 8:20 AM  Between 7am to 7pm - Pager - [phone]. After 7pm go to [email or web address] - password [facility]  [facility] - Office  [phone]

## 2020-01-21 NOTE — Progress Notes (Signed)
**Note De-Identified Christinea Brizuela Obfuscation** Patient removed from BIPAP and placed on 2 L [place]: tolerating well. VS WNL, SAT 99%.  RRT to continue to monitor.

## 2020-01-21 NOTE — ED Notes (Signed)
Hospitalist at bedside 

## 2020-01-21 NOTE — ED Provider Notes (Signed)
[facility] EMERGENCY DEPARTMENT Provider Note   CSN: [id number] Arrival date & time: 01/21/20  0607   Time seen 6:04 AM on arrival  History Chief Complaint  Patient presents with  . Shortness of Breath   Level 5 caveat for acute respiratory distress  Antonio Cobb is a 59 y.o. male.  HPI Per EMS patient got short of breath this evening and he tried using his albuterol inhalers without relief.  They were called and stated his initial pulse ox was 58% on room air.  Respiratory rate of 40 and initial blood pressure of [id number] systolic.  They gave him nitroglycerin spray x2 and put him on CPAP.  His pulse ox improved to 96% and his blood pressure improved to [id number] systolic.  Patient was just seen recently and admitted for flash pulmonary edema.  He denies any chest pain.  He denies eating anything different or something he should have.  Patient is much more alert and interactive than the last time when I saw him when he was admitted on the third.  PCP [name], MD     Past Medical History:  Diagnosis Date  . Alcohol abuse   . CAD (coronary artery disease)    a. s/p inferolateral STEMI in 05/2012 with DES to LCx b. patent stent by cath in 05/2016 with moderate RCA and LAD disease  . Cardiomyopathy ([place])    a. LVEF 30-35% by echo in 09/2016. b. 08/2017: echo showing EF remains reduced at 25-30% c. s/p [employer] ICD placement in 01/2018  . CHF (congestive heart failure) ([place])   . COPD (chronic obstructive pulmonary disease) ([place])   . Diabetes mellitus, type 2 ([place])   . Essential hypertension   . Mitral regurgitation    Moderate  . Mixed hyperlipidemia   . Myocardial infarction ([place]) 05/23/2012   Inferolateral STEMI  . Obesity     Patient Active Problem List   Diagnosis Date Noted  . Encounter for support and coordination of transition of care 11/30/2019  . CAD (coronary artery disease)- s/p STEMI with DES to Lt Cir in 2013/S/p AICD 01/2018 11/19/2019  .  Ischemic cardiomyopathy with implantable cardioverter-defibrillator (ICD)/s/p STEMI with DES to Lt Cir in 2013/S/p AICD 01/2018 11/19/2019  . Anxiety attack/panic attacks 11/19/2019  . CHF exacerbation ([place]) 11/18/2019  . Flash pulmonary edema ([place]) 11/13/2019  . Uncontrolled type 1 diabetes mellitus with hyperglycemia ([place]) 10/11/2019  . Hospital discharge follow-up 04/15/2019  . Pulmonary edema 04/05/2019  . Muscle pain 02/28/2018  . Chronic systolic (congestive) heart failure ([place]) 02/01/2018  . DM type 2 causing vascular disease ([place]) 10/18/2017  . Personal history of noncompliance with medical treatment, presenting hazards to health 10/18/2017  . Lumbar degenerative disc disease 06/20/2017  . Chronic systolic heart failure ([place]) 11/01/2016  . Acute respiratory failure with hypoxia ([place])   . AKI (acute kidney injury) ([place]) 10/07/2016  . Prolonged QT interval 10/07/2016  . Coronary artery disease involving coronary bypass graft of native heart with angina pectoris ([place])   . Abnormal echocardiogram   . Acute on chronic combined systolic and diastolic CHF (congestive heart failure) ([place])   . Allergic rhinitis 05/06/2015  . Alcohol abuse 05/23/2012  . Arteriosclerotic cardiovascular disease (ASCVD) 05/23/2012  . ED (erectile dysfunction) 05/10/2012  . Overweight with body mass index (BMI) of 29 to 29.9 in adult 08/12/2010  . Insulin-requiring or dependent type II diabetes mellitus ([place]) 03/08/2008  . Mixed hyperlipidemia 03/08/2008  . Essential hypertension 03/08/2008  Past Surgical History:  Procedure Laterality Date  . CARDIAC CATHETERIZATION  2 yrs ago  . CARDIAC CATHETERIZATION N/A 05/15/2016   Procedure: Right/Left Heart Cath and Coronary Angiography;  Surgeon: [name], MD;  Location: [place] CV LAB;  Service: Cardiovascular;  Laterality: N/A;  . CORONARY STENT PLACEMENT  05/23/12  . ICD IMPLANT N/A 02/01/2018   Procedure: ICD IMPLANT;  Surgeon: Evans Lance,  MD;  Location: [place] CV LAB;  Service: Cardiovascular;  Laterality: N/A;  . LEFT HEART CATHETERIZATION WITH CORONARY ANGIOGRAM N/A 05/23/2012   Procedure: LEFT HEART CATHETERIZATION WITH CORONARY ANGIOGRAM;  Surgeon: [name], MD;  Location: [facility] CATH LAB;  Service: Cardiovascular;  Laterality: N/A;  . None    . PERCUTANEOUS CORONARY STENT INTERVENTION (PCI-S) N/A 05/23/2012   Procedure: PERCUTANEOUS CORONARY STENT INTERVENTION (PCI-S);  Surgeon: [name], MD;  Location: [facility] CATH LAB;  Service: Cardiovascular;  Laterality: N/A;  . POLYPECTOMY  10/16/2011   Procedure: POLYPECTOMY;  Surgeon: [name], MD;  Location: [facility] OR[facility];  Service: Endoscopy;;  Polypoid Lesion, Transverse and Sigmoid Colon       Family History  Problem Relation Age of Onset  . Emphysema Mother   . Heart attack Mother   . Hypertension Mother   . Diabetes Mother   . Heart disease Mother   . COPD Mother   . Emphysema Father   . COPD Father   . Stroke Sister   . Asthma Sister   . Colon cancer Neg Hx   . Anesthesia problems Neg Hx   . Hypotension Neg Hx   . Malignant hyperthermia Neg Hx   . Pseudochol deficiency Neg Hx     Social History   Tobacco Use  . Smoking status: Former Smoker    Packs/day: 0.25    Years: 30.00    Pack years: 7.50    Types: Cigars    Quit date: 05/23/2016    Years since quitting: 3.6  . Smokeless tobacco: Never Used  . Tobacco comment: since early 95s  Substance Use Topics  . Alcohol use: Yes    Alcohol/week: 0.0 standard drinks    Comment: occ  . Drug use: No    Home Medications Prior to Admission medications   Medication Sig Start Date End Date Taking? Authorizing Provider  albuterol (VENTOLIN HFA) 108 (90 Base) MCG/ACT inhaler Inhale 2 puffs into the lungs every 6 (six) hours as needed for wheezing or shortness of breath. 11/20/19   Roxan Hockey, MD  ALPRAZolam Duanne Moron) 0.25 MG tablet Take 1 tablet (0.25 mg total) by mouth at bedtime as needed for anxiety  or sleep (As needed for anxiety/insomnia/panic attack). 11/20/19   Roxan Hockey, MD  aspirin EC 81 MG EC tablet Take 1 tablet (81 mg total) by mouth daily with breakfast. 11/20/19   Emokpae, Courage, MD  busPIRone (BUSPAR) 10 MG tablet Take 1 tablet (10 mg total) by mouth 3 (three) times daily. For anxiety 11/20/19   Roxan Hockey, MD  furosemide (LASIX) 40 MG tablet Take 1 tablet (40 mg total) by mouth 2 (two) times daily. 01/18/20 01/17/21  Doree Albee, MD  glucose blood (FREESTYLE LITE) test strip TEST FOUR TIMES DAILY 10/05/19   [name], MD  hydrOXYzine (ATARAX/VISTARIL) 25 MG tablet Take one tablet by mouth at bedtime, for anxiety and sleep 12/04/19   [name], MD  insulin regular (NOVOLIN R,HUMULIN R) 100 units/mL injection Inject 0.1 mLs (10 Units total) into the skin 3 (three) times  daily before meals. 03/27/19   Cassandria Anger, MD  ipratropium-albuterol (DUONEB) 0.5-2.5 (3) MG/3ML SOLN Take 3 mLs by nebulization every 6 (six) hours as needed. 11/20/19   Roxan Hockey, MD  LANTUS SOLOSTAR 100 UNIT/ML Solostar Pen ADMINISTER 50 UNITS UNDER THE SKIN EVERY NIGHT AT BEDTIME 08/14/19   Cassandria Anger, MD  losartan (COZAAR) 25 MG tablet Take 1 tablet (25 mg total) by mouth daily. 11/20/19   Roxan Hockey, MD  metoprolol succinate (TOPROL-XL) 50 MG 24 hr tablet TAKE 1 TABLET BY MOUTH EVERY DAY WITH FOOD 11/20/19   Roxan Hockey, MD  mometasone-formoterol (DULERA) 100-5 MCG/ACT AERO Inhale 2 puffs into the lungs 2 (two) times daily. 11/20/19   Roxan Hockey, MD  Multiple Vitamins-Minerals (MULTIVITAMIN WITH MINERALS) tablet Take 1 tablet by mouth daily. 11/20/19 11/19/20  Roxan Hockey, MD  nitroGLYCERIN (NITROSTAT) 0.4 MG SL tablet Place 1 tablet (0.4 mg total) under the tongue every 5 (five) minutes as needed for chest pain. 05/29/16 11/24/19  Lendon Colonel, NP  potassium chloride SA (KLOR-CON) 20 MEQ tablet Take 1 tablet (20 mEq total) by mouth  daily. 11/20/19   Roxan Hockey, MD  rosuvastatin (CRESTOR) 40 MG tablet Take 1 tablet (40 mg total) by mouth daily. 11/20/19   Roxan Hockey, MD  sertraline (ZOLOFT) 25 MG tablet Take 1 tablet (25 mg total) by mouth daily. For anxiety/panic attacks 11/20/19 11/19/20  Roxan Hockey, MD  spironolactone (ALDACTONE) 25 MG tablet Take 1 tablet (25 mg total) by mouth daily. 11/20/19   Roxan Hockey, MD  umeclidinium bromide (INCRUSE ELLIPTA) 62.5 MCG/INH AEPB Inhale 1 puff into the lungs daily. 11/20/19   Roxan Hockey, MD    Allergies    Entresto [sacubitril-valsartan] and Other  Review of Systems   Review of Systems  Unable to perform ROS: Severe respiratory distress    Physical Exam Updated Vital Signs BP 95/72   Pulse 96   Temp 98.1 F (36.7 C) (Oral)   Resp (!) 21   Ht 5\' 10"  (1.778 m)   Wt 89.2 kg   SpO2 99%   BMI 28.22 kg/m   Physical Exam Vitals and nursing note reviewed.  Constitutional:      General: He is in acute distress.     Appearance: Normal appearance. He is well-developed. He is obese. He is not ill-appearing or toxic-appearing.     Comments: Patient arrives with CPAP in place  HENT:     Head: Normocephalic and atraumatic.     Right Ear: External ear normal.     Left Ear: External ear normal.     Nose: No mucosal edema or rhinorrhea.     Mouth/Throat:     Dentition: No dental abscesses.     Pharynx: No uvula swelling.  Eyes:     Extraocular Movements: Extraocular movements intact.     Conjunctiva/sclera: Conjunctivae normal.     Pupils: Pupils are equal, round, and reactive to light.  Cardiovascular:     Rate and Rhythm: Regular rhythm. Tachycardia present.     Heart sounds: Normal heart sounds. No murmur. No friction rub. No gallop.   Pulmonary:     Effort: Pulmonary effort is normal. No respiratory distress.     Breath sounds: Rales present. No wheezing or rhonchi.     Comments: Patient has rales in all lung fields Chest:     Chest wall:  No tenderness or crepitus.  Abdominal:     General: Bowel sounds are normal. There is distension.  Palpations: Abdomen is soft.     Tenderness: There is no abdominal tenderness. There is no guarding or rebound.  Musculoskeletal:        General: No tenderness. Normal range of motion.     Cervical back: Full passive range of motion without pain, normal range of motion and neck supple.     Right lower leg: No edema.     Left lower leg: No edema.     Comments: Moves all extremities well.   Skin:    General: Skin is warm and dry.     Coloration: Skin is not pale.     Findings: No erythema or rash.  Neurological:     General: No focal deficit present.     Mental Status: He is alert and oriented to person, place, and time.     Cranial Nerves: No cranial nerve deficit.  Psychiatric:        Mood and Affect: Mood normal. Mood is not anxious.        Speech: Speech normal.        Behavior: Behavior normal.        Thought Content: Thought content normal.     ED Results / Procedures / Treatments   Labs (all labs ordered are listed, but only abnormal results are displayed)  Results for orders placed or performed during the hospital encounter of 01/21/20  Comprehensive metabolic panel  Result Value Ref Range   Sodium 139 135 - 145 mmol/L   Potassium 3.6 3.5 - 5.1 mmol/L   Chloride 101 98 - 111 mmol/L   CO2 23 22 - 32 mmol/L   Glucose, Bld 265 (H) 70 - 99 mg/dL   BUN 27 (H) 6 - 20 mg/dL   Creatinine, Ser 1.57 (H) 0.61 - 1.24 mg/dL   Calcium 8.9 8.9 - 10.3 mg/dL   Total Protein 8.1 6.5 - 8.1 g/dL   Albumin 4.0 3.5 - 5.0 g/dL   AST 27 15 - 41 U/L   ALT 27 0 - 44 U/L   Alkaline Phosphatase 110 38 - 126 U/L   Total Bilirubin 0.8 0.3 - 1.2 mg/dL   GFR calc non Af Amer 48 (L) >60 mL/min   GFR calc Af Amer 55 (L) >60 mL/min   Anion gap 15 5 - 15  CBC with Differential  Result Value Ref Range   WBC 13.0 (H) 4.0 - 10.5 K/uL   RBC 5.42 4.22 - 5.81 MIL/uL   Hemoglobin 15.4 13.0 - 17.0  g/dL   HCT 49.9 39.0 - 52.0 %   MCV 92.1 80.0 - 100.0 fL   MCH 28.4 26.0 - 34.0 pg   MCHC 30.9 30.0 - 36.0 g/dL   RDW 12.2 11.5 - 15.5 %   Platelets 282 150 - 400 K/uL   nRBC 0.0 0.0 - 0.2 %   Neutrophils Relative % 41 %   Neutro Abs 5.4 1.7 - 7.7 K/uL   Lymphocytes Relative 39 %   Lymphs Abs 5.0 (H) 0.7 - 4.0 K/uL   Monocytes Relative 7 %   Monocytes Absolute 0.9 0.1 - 1.0 K/uL   Eosinophils Relative 11 %   Eosinophils Absolute 1.5 (H) 0.0 - 0.5 K/uL   Basophils Relative 1 %   Basophils Absolute 0.2 (H) 0.0 - 0.1 K/uL   Immature Granulocytes 1 %   Abs Immature Granulocytes 0.06 0.00 - 0.07 K/uL  Brain natriuretic peptide  Result Value Ref Range   B Natriuretic Peptide 300.0 (H) 0.0 - 100.0  pg/mL  Blood gas, venous  Result Value Ref Range   FIO2 99.00    pH, Ven 7.184 (LL) 7.250 - 7.430   pCO2, Ven 64.5 (H) 44.0 - 60.0 mmHg   pO2, Ven 32.1 32.0 - 45.0 mmHg   Bicarbonate 18.4 (L) 20.0 - 28.0 mmol/L   Acid-base deficit 3.8 (H) 0.0 - 2.0 mmol/L   O2 Saturation 40.2 %   Patient temperature 37.0   Magnesium  Result Value Ref Range   Magnesium 2.4 1.7 - 2.4 mg/dL   Laboratory interpretation all normal except mild worsening of his renal insufficiency, leukocytosis, minimally elevated BNP, VBG which was done almost an hour after it was drawn showed respiratory acidosis   Results for orders placed or performed during the hospital encounter of 01/17/20  Respiratory Panel by RT PCR (Flu A&B, Covid) - Nasopharyngeal Swab   Specimen: Nasopharyngeal Swab  Result Value Ref Range   SARS Coronavirus 2 by RT PCR NEGATIVE NEGATIVE   Influenza A by PCR NEGATIVE NEGATIVE   Influenza B by PCR NEGATIVE NEGATIVE  MRSA PCR Screening   Specimen: Nasopharyngeal  Result Value Ref Range   MRSA by PCR NEGATIVE NEGATIVE     EKG EKG Interpretation  Date/Time:  Sunday January 21 2020 06:09:50 EST Ventricular Rate:  103 PR Interval:    QRS Duration: 86 QT Interval:  335 QTC  Calculation: 439 R Axis:   6 Text Interpretation: Sinus tachycardia Ventricular premature complex Abnormal R-wave progression, early transition Borderline repolarization abnormality Electrode noise No significant change since last tracing 17 Jan 2020 Confirmed by Rolland Porter [phone]) on 01/21/2020 6:16:42 AM   Radiology DG Chest Port 1 View  Result Date: 01/21/2020 CLINICAL DATA:  59 year old male with hypoxia and shortness of breath. EXAM: PORTABLE CHEST 1 VIEW COMPARISON:  Portable chest 01/18/2020 and earlier. FINDINGS: Portable AP upright view at 0624 hours. Stable lung volumes and mediastinal contours. Stable left chest AICD. Stable ventilation since yesterday with residual bilateral interstitial opacity, improved compared to 01/17/2020. Superimposed chronic right upper lobe scarring. No pneumothorax. No pleural effusion is evident. Visualized tracheal air column is within normal limits. Negative visible bowel gas pattern. No acute osseous abnormality identified. IMPRESSION: Stable chest since yesterday, decreased but not resolved diffuse pulmonary interstitial opacity since 01/17/2020. Electronically Signed   By: Genevie Ann M.D.   On: 01/21/2020 06:45    Procedures .Critical Care Performed by: Rolland Porter, MD Authorized by: Rolland Porter, MD   Critical care provider statement:    Critical care time (minutes):  35   Critical care was necessary to treat or prevent imminent or life-threatening deterioration of the following conditions:  Respiratory failure   Critical care was time spent personally by me on the following activities:  Discussions with consultants, examination of patient, obtaining history from patient or surrogate, ordering and review of laboratory studies, ordering and review of radiographic studies, pulse oximetry, re-evaluation of patient's condition and review of old charts   (including critical care time)  Echocardiogram 11/13/2019 1. Left ventricular ejection fraction, by visual  estimation, is 25%. The  left ventricle has severely decreased function. There is mildly increased  left ventricular hypertrophy.  2. Multiple segmental abnormalities exist. See findings.  3. Left ventricular diastolic parameters are indeterminate.  4. Moderately dilated left ventricular internal cavity size.  5. Global right ventricle has normal systolic function.The right  ventricular size is normal. No increase in right ventricular wall  thickness.  6. Left atrial size was normal.  7. Right  atrial size was normal.  8. Presence of pericardial fat pad.  9. Mild to moderate aortic valve annular calcification.  10. The mitral valve is grossly normal. Mild mitral valve regurgitation.  11. The tricuspid valve is grossly normal. Tricuspid valve regurgitation  is trivial.  12. The aortic valve is tricuspid. Aortic valve regurgitation is trivial.  13. The pulmonic valve was grossly normal. Pulmonic valve regurgitation is  not visualized.  14. TR signal is inadequate for assessing pulmonary artery systolic  pressure.  15. A pacer wire is visualized.  16. The inferior vena cava is normal in size with greater than 50%  respiratory variability, suggesting right atrial pressure of 3 mmHg.   Medications Ordered in ED Medications  nitroGLYCERIN (NITROGLYN) 2 % ointment 1 inch (1 inch Topical Not Given 01/21/20 0634)  furosemide (LASIX) injection 100 mg (100 mg Intravenous Given 01/21/20 0635)  albuterol (PROVENTIL,VENTOLIN) solution continuous neb (10 mg/hr Nebulization Given 01/21/20 0621)  ipratropium (ATROVENT) nebulizer solution 0.5 mg (0.5 mg Nebulization Given 01/21/20 [id number])    ED Course  I have reviewed the triage vital signs and the nursing notes.  Pertinent labs & imaging results that were available during my care of the patient were reviewed by me and considered in my medical decision making (see chart for details).    MDM Rules/Calculators/A&P                      Patient was  taken off CPAP from EMS and placed on BiPAP by respiratory therapy.  He was given albuterol 10 mg plus Atrovent continuous nebulizer, 1 inch of Nitropaste was placed on a skin, and he was given Lasix 100 mg IV.  His BUN/creatinine on February 4 was 29/1.46.  So he does have a mild renal insufficiency.  As I recall from his previous visit it took him a long time to start having diuresis from the Lasix my gave him 80 mg.  Patient is able to communicate on arrival here today much quicker than he did his prior visit.  1 inch of Nitropaste was placed on his skin to help with his congestive heart failure and for control of his blood pressure.  Patient became hypotensive after his IV Lasix and a nitroglycerin drip not be given.  Recheck at 7:00 patient states he is feeling better, he indicates he thought maybe he was just having a panic attack.  We discussed however when his oxygen gets low like that his brain will panic because it wants more oxygen and I thought his symptoms were more physical rather than mental.  I will talk to the hospitalist about admission.  7:22 AM Dr. Waldron Labs, hospitalist will admit.  Patient has not had urinary output yet.  Final Clinical Impression(s) / ED Diagnoses Final diagnoses:  Acute on chronic congestive heart failure, unspecified heart failure type ([place])  Acute respiratory failure with hypoxia and hypercapnia ([facility])  Hypertensive urgency    Rx / DC Orders  Plan admission  Rolland Porter, MD, Barbette Or, MD 01/21/20 [phone]

## 2020-01-21 NOTE — Progress Notes (Signed)
Pt requested something for anxiety, MD notified and medication ordered (see MAR). Pt sitting up on side of bed, call bell in reach

## 2020-01-21 NOTE — ED Notes (Signed)
Pt has not had any urinary output at this time

## 2020-01-21 NOTE — ED Notes (Signed)
Date and time results received: 01/21/20 0703 (use smartphrase ".now" to insert current time)  Test: ph Critical Value: 7.184  Name of Provider Notified: knapp  Orders Received? Or Actions Taken?: see chart

## 2020-01-21 NOTE — Assessment & Plan Note (Signed)
Reports abstinence, advised and encouraged to remain [facility] free due to severe heart disease and uncontrolled diabetes

## 2020-01-21 NOTE — Assessment & Plan Note (Signed)
Denies symptoms of decompensation, re educated regarding control

## 2020-01-21 NOTE — Assessment & Plan Note (Signed)
Hyperlipidemia:Low fat diet discussed and encouraged.   Lipid Panel  Lab Results  Component Value Date   CHOL 161 10/23/2019   HDL 41 10/23/2019   LDLCALC 82 10/23/2019   LDLDIRECT TEST NOT PERFORMED 06/19/2011   TRIG 289 (H) 10/23/2019   CHOLHDL 3.9 10/23/2019  uncontrolled, needs to reduce fat intake

## 2020-01-21 NOTE — Assessment & Plan Note (Signed)
Controlled, no change in medication  

## 2020-01-22 ENCOUNTER — Inpatient Hospital Stay (HOSPITAL_COMMUNITY): Payer: BC Managed Care – PPO

## 2020-01-22 DIAGNOSIS — I160 Hypertensive urgency: Secondary | ICD-10-CM

## 2020-01-22 DIAGNOSIS — R05 Cough: Secondary | ICD-10-CM | POA: Diagnosis not present

## 2020-01-22 DIAGNOSIS — I509 Heart failure, unspecified: Secondary | ICD-10-CM

## 2020-01-22 DIAGNOSIS — J9601 Acute respiratory failure with hypoxia: Secondary | ICD-10-CM | POA: Diagnosis not present

## 2020-01-22 DIAGNOSIS — J810 Acute pulmonary edema: Secondary | ICD-10-CM

## 2020-01-22 DIAGNOSIS — R0600 Dyspnea, unspecified: Secondary | ICD-10-CM | POA: Diagnosis not present

## 2020-01-22 DIAGNOSIS — I5043 Acute on chronic combined systolic (congestive) and diastolic (congestive) heart failure: Secondary | ICD-10-CM | POA: Diagnosis not present

## 2020-01-22 DIAGNOSIS — I130 Hypertensive heart and chronic kidney disease with heart failure and stage 1 through stage 4 chronic kidney disease, or unspecified chronic kidney disease: Secondary | ICD-10-CM | POA: Diagnosis not present

## 2020-01-22 DIAGNOSIS — R0602 Shortness of breath: Secondary | ICD-10-CM | POA: Diagnosis not present

## 2020-01-22 DIAGNOSIS — Z20822 Contact with and (suspected) exposure to covid-19: Secondary | ICD-10-CM | POA: Diagnosis not present

## 2020-01-22 LAB — BASIC METABOLIC PANEL
Anion gap: 10 (ref 5–15)
BUN: 28 mg/dL — ABNORMAL HIGH (ref 6–20)
CO2: 24 mmol/L (ref 22–32)
Calcium: 9.1 mg/dL (ref 8.9–10.3)
Chloride: 108 mmol/L (ref 98–111)
Creatinine, Ser: 1.36 mg/dL — ABNORMAL HIGH (ref 0.61–1.24)
GFR calc Af Amer: >60 mL/min (ref >60)
GFR calc non Af Amer: 57 mL/min — ABNORMAL LOW (ref >60)
Glucose, Bld: 154 mg/dL — ABNORMAL HIGH (ref 70–99)
Potassium: 4.3 mmol/L (ref 3.5–5.1)
Sodium: 142 mmol/L (ref 135–145)

## 2020-01-22 LAB — GLUCOSE, CAPILLARY
Glucose-Capillary: 146 mg/dL — ABNORMAL HIGH (ref 70–99)
Glucose-Capillary: 140 mg/dL — ABNORMAL HIGH (ref 70–99)
Glucose-Capillary: 118 mg/dL — ABNORMAL HIGH (ref 70–99)
Glucose-Capillary: 138 mg/dL — ABNORMAL HIGH (ref 70–99)

## 2020-01-22 MED ORDER — INSULIN ASPART 100 UNIT/ML ~~LOC~~ SOLN
0.0000 [IU] | Freq: Three times a day (TID) | SUBCUTANEOUS | Status: DC
  Administered 2020-01-22 – 2020-01-23 (×3): 1 [IU] via SUBCUTANEOUS
  Administered 2020-01-23: 14:00:00 2 [IU] via SUBCUTANEOUS
  Administered 2020-01-23 – 2020-01-25 (×5): 1 [IU] via SUBCUTANEOUS

## 2020-01-22 MED ORDER — IPRATROPIUM-ALBUTEROL 0.5-2.5 (3) MG/3ML IN SOLN: 3.0000 mL | Freq: Four times a day (QID) | RESPIRATORY_TRACT | Status: DC | PRN

## 2020-01-22 MED ORDER — SERTRALINE HCL 50 MG PO TABS
25.0000 mg | ORAL_TABLET | Freq: Every day | ORAL | Status: DC
  Administered 2020-01-22 – 2020-01-25 (×4): 25 mg via ORAL
  Filled 2020-01-22 (×4): qty 1

## 2020-01-22 MED ORDER — INSULIN GLARGINE 100 UNIT/ML ~~LOC~~ SOLN
30.0000 [IU] | Freq: Every day | SUBCUTANEOUS | Status: DC
  Administered 2020-01-22 – 2020-01-24 (×3): 30 [IU] via SUBCUTANEOUS
  Filled 2020-01-22 (×4): qty 0.3

## 2020-01-22 MED ORDER — ADULT MULTIVITAMIN W/MINERALS CH
1.0000 | ORAL_TABLET | Freq: Every day | ORAL | Status: DC
  Administered 2020-01-22 – 2020-01-25 (×4): 1 via ORAL
  Filled 2020-01-22 (×4): qty 1

## 2020-01-22 MED ORDER — INSULIN ASPART 100 UNIT/ML ~~LOC~~ SOLN
8.0000 [IU] | Freq: Three times a day (TID) | SUBCUTANEOUS | Status: DC
  Administered 2020-01-22 – 2020-01-25 (×11): 8 [IU] via SUBCUTANEOUS

## 2020-01-22 MED ORDER — FUROSEMIDE 10 MG/ML IJ SOLN
40.0000 mg | Freq: Every day | INTRAMUSCULAR | Status: DC
  Administered 2020-01-22 – 2020-01-23 (×2): 40 mg via INTRAVENOUS
  Filled 2020-01-22 (×3): qty 4

## 2020-01-22 MED ORDER — METOPROLOL SUCCINATE ER 50 MG PO TB24
50.0000 mg | ORAL_TABLET | Freq: Every day | ORAL | Status: DC
  Administered 2020-01-22 – 2020-01-24 (×3): 50 mg via ORAL
  Filled 2020-01-22 (×4): qty 1

## 2020-01-22 MED ORDER — MOMETASONE FURO-FORMOTEROL FUM 100-5 MCG/ACT IN AERO
2.0000 | INHALATION_SPRAY | Freq: Two times a day (BID) | RESPIRATORY_TRACT | Status: DC
  Filled 2020-01-22: qty 8.8

## 2020-01-22 MED ORDER — INSULIN ASPART 100 UNIT/ML ~~LOC~~ SOLN: 0.0000 [IU] | Freq: Every day | SUBCUTANEOUS | Status: DC

## 2020-01-22 MED ORDER — ALPRAZOLAM 0.25 MG PO TABS
0.2500 mg | ORAL_TABLET | Freq: Every evening | ORAL | Status: DC | PRN
  Administered 2020-01-22 – 2020-01-24 (×3): 0.25 mg via ORAL
  Filled 2020-01-22 (×3): qty 1

## 2020-01-22 MED ORDER — BUSPIRONE HCL 5 MG PO TABS
10.0000 mg | ORAL_TABLET | Freq: Three times a day (TID) | ORAL | Status: DC
  Administered 2020-01-22 – 2020-01-25 (×10): 10 mg via ORAL
  Filled 2020-01-22 (×10): qty 2

## 2020-01-22 NOTE — Progress Notes (Signed)
EKG completed and placed on pts chart.  

## 2020-01-22 NOTE — TOC Initial Note (Signed)
Transition of Care [facility]) - Initial/Assessment Note    Patient Details  Name: Antonio Cobb MRN: [id number] Date of Birth: 10-04-1961  Transition of Care [facility]) CM/SW Contact:    Sherie Don, LCSW Phone Number: 01/22/2020, 4:07 PM  Clinical Narrative: Antonio Cobb is a 59 year old male, with medical history ofalcohol abuse, coronary artery disease, systolic heart failure, COPD, type 2 diabetes mellitus poorly controlled and insulin-dependent, essential hypertension, hyperlipidemia, chronic kidney disease who presented to the ER with worsening shortness of breath following an ED visit on 01/17/2020.  Per conversation with patient, he resides with his wife and is able to complete all ADLs independently. Patient is currently employed full-time and is able to drive himself to and from work. Patient reported he has no DME at this time. Patient reported he weighs himself daily and does not use salt in his diet and is unsure why he continues to experience shortness of breath.  Due to patient's independence with ADLs and being employed, home health is not appropriate. No TOC needs were identified at this time. Re-consult if needed.                  Expected Discharge Plan: Home/Self Care Barriers to Discharge: Continued Medical Work up   Patient Goals and CMS Choice        Expected Discharge Plan and Services Expected Discharge Plan: Home/Self Care In-house Referral: Clinical Social Work     Living arrangements for the past 2 months: Single Family Home                                      Prior Living Arrangements/Services Living arrangements for the past 2 months: Single Family Home Lives with:: Spouse Patient language and need for interpreter reviewed:: Yes Do you feel safe going back to the place where you live?: Yes      Need for Family Participation in Patient Care: No (Comment) Care giver support system in place?: Yes (comment)   Criminal Activity/Legal  Involvement Pertinent to Current Situation/Hospitalization: No - Comment as needed  Activities of Daily Living Home Assistive Devices/Equipment: None ADL Screening (condition at time of admission) Patient's cognitive ability adequate to safely complete daily activities?: Yes Is the patient deaf or have difficulty hearing?: No Does the patient have difficulty seeing, even when wearing glasses/contacts?: No Does the patient have difficulty concentrating, remembering, or making decisions?: No Patient able to express need for assistance with ADLs?: Yes Does the patient have difficulty dressing or bathing?: No Independently performs ADLs?: Yes (appropriate for developmental age) Communication: Independent Does the patient have difficulty walking or climbing stairs?: No Weakness of Legs: None Weakness of Arms/Hands: None  Permission Sought/Granted                  Emotional Assessment Appearance:: Appears stated age Attitude/Demeanor/Rapport: Engaged Affect (typically observed): Accepting, Appropriate Orientation: : Oriented to Self, Oriented to Place, Oriented to  Time, Oriented to Situation Alcohol / Substance Use: Tobacco Use, Alcohol Use Psych Involvement: No (comment)  Admission diagnosis:  Dyspnea [R06.00] Acute CHF (congestive heart failure) (HCC) [I50.9] Hypertensive urgency [I16.0] Acute respiratory failure with hypoxia and hypercapnia (HCC) [J96.01, J96.02] Acute on chronic congestive heart failure, unspecified heart failure type [facility]) [I50.9] Patient Active Problem List   Diagnosis Date Noted  . Hypertensive urgency   . Acute CHF (congestive heart failure) ([place]) 01/21/2020  . Dyspnea 01/21/2020  .  Encounter for support and coordination of transition of care 11/30/2019  . CAD (coronary artery disease)- s/p STEMI with DES to Lt Cir in 2013/S/p AICD 01/2018 11/19/2019  . Ischemic cardiomyopathy with implantable cardioverter-defibrillator (ICD)/s/p STEMI with DES to Lt  Cir in 2013/S/p AICD 01/2018 11/19/2019  . Anxiety attack/panic attacks 11/19/2019  . CHF exacerbation ([place]) 11/18/2019  . Flash pulmonary edema ([place]) 11/13/2019  . Uncontrolled type 1 diabetes mellitus with hyperglycemia ([place]) 10/11/2019  . Hospital discharge follow-up 04/15/2019  . Pulmonary edema 04/05/2019  . Muscle pain 02/28/2018  . Chronic systolic (congestive) heart failure ([place]) 02/01/2018  . DM type 2 causing vascular disease ([place]) 10/18/2017  . Personal history of noncompliance with medical treatment, presenting hazards to health 10/18/2017  . Lumbar degenerative disc disease 06/20/2017  . Chronic systolic heart failure ([place]) 11/01/2016  . Acute respiratory failure with hypoxia and hypercapnia (HCC)   . AKI (acute kidney injury) ([place]) 10/07/2016  . Prolonged QT interval 10/07/2016  . Coronary artery disease involving coronary bypass graft of native heart with angina pectoris ([place])   . Abnormal echocardiogram   . Acute on chronic combined systolic and diastolic CHF (congestive heart failure) ([place])   . Allergic rhinitis 05/06/2015  . Alcohol abuse 05/23/2012  . Arteriosclerotic cardiovascular disease (ASCVD) 05/23/2012  . ED (erectile dysfunction) 05/10/2012  . Overweight with body mass index (BMI) of 29 to 29.9 in adult 08/12/2010  . Insulin-requiring or dependent type II diabetes mellitus ([place]) 03/08/2008  . Mixed hyperlipidemia 03/08/2008  . Essential hypertension 03/08/2008   PCP:  [name], MD Pharmacy:   [facility], [place] [address] [place] [id number] [place] [address] Phone: [phone] Fax: [phone]     Social Determinants of Health (SDOH) Interventions    Readmission Risk Interventions Readmission Risk Prevention Plan 11/13/2019 04/05/2019  Medication Screening Complete Complete  Transportation Screening Complete Complete  Some recent data might be hidden

## 2020-01-22 NOTE — Progress Notes (Signed)
PROGRESS NOTE [facility]   Antonio Cobb  [id number]  DOB: [birth year]  DOA: 01/21/2020 PCP: [name], MD   Brief Admission Hx: y.o. male, with medical history significant ofalcohol abuse, coronary artery disease status post STEMI with DES to left circumflex in 0000000, systolic heart failure with ejection fraction 25 to 30% and is status post ICD, COPD, type 2 diabetes mellitus poorly controlled and insulin-dependent, essential hypertension, hyperlipidemia, chronic kidney disease a stage IIIawho presented to the ER with worsening shortness of breath, patient with recent hospitalization, was discharged 01/18/2020, secondary to congestive heart failure episode, where he was discharged on Lasix oral, patient presents with shortness of breath, developed overnight, no relief with using his albuterol inhaler, he was found to be hypoxic by EMS saturating 58% on room air, respiratory rate of 40, with elevated blood pressure, [id number] systolic, patient did receive nitroglycerin spray x2, and he was started on CPAP, upon presentation to ED.   MDM/Assessment & Plan:   1. Hypertensive urgency-likely exacerbated by acute CHF exacerbation and flash pulmonary edema.  His blood pressure has markedly improved with diuresis.  He has been resumed on his home blood pressure medication regimen.  Continue to follow closely. 2. Acute systolic CHF exacerbation with flash pulmonary edema-patient was given 100 mg IV Lasix in the ED and has diuresed very well.  He is feeling a lot better.  His IV Lasix dose has been reduced to 40 mg IV daily.  We will continue to monitor diuresis.  He is not stable to discharge home at this time as he had a rapid return to the hospital when he was discharged during last admission.  We will monitor him to make sure he is stable before discharge.  Cardiology has seen him and they have given recommendations noted in the records. 3. Coronary artery disease status post stent  placement-she remains on aspirin daily.  No acute findings on chest x-ray and troponins have been stable.  Continue telemetry monitoring. 4. Essential hypertension-he has been resumed on home blood pressure medications and will follow. 5. Hyperlipidemia with goal LDL less than 70-he is on Crestor daily which we will continue. 6. Type 2 diabetes mellitus, insulin requiring-poorly controlled as evidenced by hemoglobin A1c of 10.2%.  He has been restarted on home Lantus and sliding scale coverage.  He may require prandial coverage.  We will monitor his oral intake and make a decision about prandial coverage. 7. Chronic alcoholism-he has been counseled on cessation and will monitor for signs of withdrawal. 8. Stage III CKD-monitor renal function while diuresing with IV Lasix. 9. COPD-stable.  He reports that he no longer takes regular schedule respiratory bronchodilators.  DVT prophylaxis: Lovenox Code Status: Full Family Communication: Patient determined to have full capacity, counseled at bedside and verbalizes understanding. Disposition Plan: Continue inpatient management for IV Lasix diuresis, subspecialty consultation, telemetry monitoring.   Consultants:  Cardiology   Procedures:    Antimicrobials:     Subjective: Patient says he is feeling a lot better and he has been urinating frequently.  He denies chest pain.  His shortness of breath is slowly improving with diuresis.  Objective: Vitals:   01/21/20 2119 01/21/20 2219 01/22/20 0529 01/22/20 0912  BP: (!) 155/89  (!) 153/84   Pulse: 83 81 85   Resp: 19 20 20    Temp: 98.6 F (37 C)  99 F (37.2 C)   TempSrc: Oral  Oral   SpO2: 97% 96% 96% 95%  Weight:  92.1 kg   Height:        Intake/Output Summary (Last 24 hours) at 01/22/2020 1130 Last data filed at 01/22/2020 0700 Gross per 24 hour  Intake 365 ml  Output 1000 ml  Net -635 ml   Filed Weights   01/21/20 0611 01/21/20 1704 01/22/20 0529  Weight: 89.2 kg 90.7 kg  92.1 kg   REVIEW OF SYSTEMS  As per history otherwise all reviewed and reported negative  Exam:  General exam: Awake alert cooperative sitting up in the bed in no apparent distress. Respiratory system: Bilateral breath sounds bibasilar crackles, no increased work of breathing. Cardiovascular system: S1 & S2 heard.  Gastrointestinal system: Abdomen is nondistended, soft and nontender. Normal bowel sounds heard. Central nervous system: Alert and oriented. No focal neurological deficits. Extremities: trace pretibial edema bilateral LEs.   Data Reviewed: Basic Metabolic Panel: Recent Labs  Lab 01/17/20 0226 01/17/20 0509 01/18/20 0539 01/21/20 0615 01/22/20 0544  NA 136 140 143 139 142  K 3.3* 4.3 3.6 3.6 4.3  CL 99 97* 100 101 108  CO2 25 30 29 23 24   GLUCOSE 361* 336* 126* 265* 154*  BUN 27* 30* 29* 27* 28*  CREATININE 1.83* 1.74* 1.46* 1.57* 1.36*  CALCIUM 8.8* 9.5 9.6 8.9 9.1  MG  --  2.3  --  2.4  --    Liver Function Tests: Recent Labs  Lab 01/17/20 0226 01/21/20 0615  AST 26 27  ALT 27 27  ALKPHOS 110 110  BILITOT 0.7 0.8  PROT 7.6 8.1  ALBUMIN 3.9 4.0   No results for input(s): LIPASE, AMYLASE in the last 168 hours. No results for input(s): AMMONIA in the last 168 hours. CBC: Recent Labs  Lab 01/17/20 0226 01/17/20 0509 01/21/20 0615  WBC 17.0* 13.3* 13.0*  NEUTROABS 5.5  --  5.4  HGB 15.7 14.5 15.4  HCT 49.8 46.8 49.9  MCV 93.3 91.9 92.1  PLT 305 251 282   Cardiac Enzymes: No results for input(s): CKTOTAL, CKMB, CKMBINDEX, TROPONINI in the last 168 hours. CBG (last 3)  Recent Labs    01/21/20 2121 01/22/20 0725 01/22/20 1057  GLUCAP 160* 146* 140*   Recent Results (from the past 240 hour(s))  Respiratory Panel by RT PCR (Flu A&B, Covid) - Nasopharyngeal Swab     Status: None   Collection Time: 01/17/20  3:48 AM   Specimen: Nasopharyngeal Swab  Result Value Ref Range Status   SARS Coronavirus 2 by RT PCR NEGATIVE NEGATIVE Final     Comment: (NOTE) SARS-CoV-2 target nucleic acids are NOT DETECTED. The SARS-CoV-2 RNA is generally detectable in upper respiratoy specimens during the acute phase of infection. The lowest concentration of SARS-CoV-2 viral copies this assay can detect is 131 copies/mL. A negative result does not preclude SARS-Cov-2 infection and should not be used as the sole basis for treatment or other patient management decisions. A negative result may occur with  improper specimen collection/handling, submission of specimen other than nasopharyngeal swab, presence of viral mutation(s) within the areas targeted by this assay, and inadequate number of viral copies (<131 copies/mL). A negative result must be combined with clinical observations, patient history, and epidemiological information. The expected result is Negative. Fact Sheet for Patients:  PinkCheek.be Fact Sheet for Healthcare Providers:  GravelBags.it This test is not yet ap proved or cleared by the [place] FDA and  has been authorized for detection and/or diagnosis of SARS-CoV-2 by FDA under an Emergency Use Authorization (EUA). This EUA will  remain  in effect (meaning this test can be used) for the duration of the COVID-19 declaration under Section 564(b)(1) of the Act, 21 U.S.C. section 360bbb-3(b)(1), unless the authorization is terminated or revoked sooner.    Influenza A by PCR NEGATIVE NEGATIVE Final   Influenza B by PCR NEGATIVE NEGATIVE Final    Comment: (NOTE) The Xpert Xpress SARS-CoV-2/FLU/RSV assay is intended as an aid in  the diagnosis of influenza from Nasopharyngeal swab specimens and  should not be used as a sole basis for treatment. Nasal washings and  aspirates are unacceptable for Xpert Xpress SARS-CoV-2/FLU/RSV  testing. Fact Sheet for Patients: PinkCheek.be Fact Sheet for Healthcare  Providers: GravelBags.it This test is not yet approved or cleared by the [place] FDA and  has been authorized for detection and/or diagnosis of SARS-CoV-2 by  FDA under an Emergency Use Authorization (EUA). This EUA will remain  in effect (meaning this test can be used) for the duration of the  Covid-19 declaration under Section 564(b)(1) of the Act, 21  U.S.C. section 360bbb-3(b)(1), unless the authorization is  terminated or revoked. Performed at [facility], [address], [place], [place] [address]   MRSA PCR Screening     Status: None   Collection Time: 01/17/20  6:33 AM   Specimen: Nasopharyngeal  Result Value Ref Range Status   MRSA by PCR NEGATIVE NEGATIVE Final    Comment:        The GeneXpert MRSA Assay (FDA approved for NASAL specimens only), is one component of a comprehensive MRSA colonization surveillance program. It is not intended to diagnose MRSA infection nor to guide or monitor treatment for MRSA infections. Performed at [facility], [address] [place],  [address]   Respiratory Panel by RT PCR (Flu A&B, Covid) - Nasopharyngeal Swab     Status: None   Collection Time: 01/21/20  9:43 AM   Specimen: Nasopharyngeal Swab  Result Value Ref Range Status   SARS Coronavirus 2 by RT PCR NEGATIVE NEGATIVE Final    Comment: (NOTE) SARS-CoV-2 target nucleic acids are NOT DETECTED. The SARS-CoV-2 RNA is generally detectable in upper respiratoy specimens during the acute phase of infection. The lowest concentration of SARS-CoV-2 viral copies this assay can detect is 131 copies/mL. A negative result does not preclude SARS-Cov-2 infection and should not be used as the sole basis for treatment or other patient management decisions. A negative result may occur with  improper specimen collection/handling, submission of specimen other than nasopharyngeal swab, presence of viral mutation(s) within the areas targeted by this assay,  and inadequate number of viral copies (<131 copies/mL). A negative result must be combined with clinical observations, patient history, and epidemiological information. The expected result is Negative. Fact Sheet for Patients:  PinkCheek.be Fact Sheet for Healthcare Providers:  GravelBags.it This test is not yet ap proved or cleared by the [place] FDA and  has been authorized for detection and/or diagnosis of SARS-CoV-2 by FDA under an Emergency Use Authorization (EUA). This EUA will remain  in effect (meaning this test can be used) for the duration of the COVID-19 declaration under Section 564(b)(1) of the Act, 21 U.S.C. section 360bbb-3(b)(1), unless the authorization is terminated or revoked sooner.    Influenza A by PCR NEGATIVE NEGATIVE Final   Influenza B by PCR NEGATIVE NEGATIVE Final    Comment: (NOTE) The Xpert Xpress SARS-CoV-2/FLU/RSV assay is intended as an aid in  the diagnosis of influenza from Nasopharyngeal swab specimens and  should not be  used as a sole basis for treatment. Nasal washings and  aspirates are unacceptable for Xpert Xpress SARS-CoV-2/FLU/RSV  testing. Fact Sheet for Patients: PinkCheek.be Fact Sheet for Healthcare Providers: GravelBags.it This test is not yet approved or cleared by the [place] FDA and  has been authorized for detection and/or diagnosis of SARS-CoV-2 by  FDA under an Emergency Use Authorization (EUA). This EUA will remain  in effect (meaning this test can be used) for the duration of the  Covid-19 declaration under Section 564(b)(1) of the Act, 21  U.S.C. section 360bbb-3(b)(1), unless the authorization is  terminated or revoked. Performed at [facility], [address], [place], [place] [address]      Studies: DG CHEST PORT 1 VIEW  Result Date: 01/22/2020 CLINICAL DATA:  Cough and intermittent shortness  of breath EXAM: PORTABLE CHEST 1 VIEW COMPARISON:  01/21/2020 FINDINGS: There is a left chest wall ICD with lead in the right ventricle. Heart size normal. Interval decrease in interstitial opacities identified on the previous exam. Scar noted within the right upper lobe. IMPRESSION: 1. Interval improvement in previously noted pulmonary edema pattern. Electronically Signed   By: Kerby Moors M.D.   On: 01/22/2020 09:37   DG Chest Port 1 View  Result Date: 01/21/2020 CLINICAL DATA:  59 year old male with hypoxia and shortness of breath. EXAM: PORTABLE CHEST 1 VIEW COMPARISON:  Portable chest 01/18/2020 and earlier. FINDINGS: Portable AP upright view at 0624 hours. Stable lung volumes and mediastinal contours. Stable left chest AICD. Stable ventilation since yesterday with residual bilateral interstitial opacity, improved compared to 01/17/2020. Superimposed chronic right upper lobe scarring. No pneumothorax. No pleural effusion is evident. Visualized tracheal air column is within normal limits. Negative visible bowel gas pattern. No acute osseous abnormality identified. IMPRESSION: Stable chest since yesterday, decreased but not resolved diffuse pulmonary interstitial opacity since 01/17/2020. Electronically Signed   By: Genevie Ann M.D.   On: 01/21/2020 06:45   Scheduled Meds: . aspirin EC  81 mg Oral Q breakfast  . busPIRone  10 mg Oral TID  . enoxaparin (LOVENOX) injection  40 mg Subcutaneous Q24H  . furosemide  40 mg Intravenous Daily  . insulin aspart  0-5 Units Subcutaneous QHS  . insulin aspart  0-9 Units Subcutaneous TID WC  . insulin aspart  8 Units Subcutaneous TID WC  . metoprolol succinate  50 mg Oral Daily  . multivitamin with minerals  1 tablet Oral Daily  . rosuvastatin  40 mg Oral Daily  . sertraline  25 mg Oral Daily  . sodium chloride flush  3 mL Intravenous Q12H   Continuous Infusions: . sodium chloride     Active Problems:   Essential hypertension   Acute on chronic combined  systolic and diastolic CHF (congestive heart failure) (HCC)   Acute respiratory failure with hypoxia (HCC)   DM type 2 causing vascular disease (HCC)   CAD (coronary artery disease)- s/p STEMI with DES to Lt Cir in 2013/S/p AICD 01/2018   Acute CHF (congestive heart failure) (HCC)   Dyspnea  Time spent:   Irwin Brakeman, MD [facility] 01/22/2020, 11:30 AM    LOS: 1 day  How to contact the [facility] Attending or Consulting provider [place] or covering provider during after hours [place], for this patient?  1. Check the care team in [facility] and look for a) attending/consulting TRH provider listed and b) the [facility] team listed 2. Log into [email or web address] and use [place]'s universal password to access. If you  do not have the password, please contact the hospital operator. 3. Locate the [facility] provider you are looking for under [facility] and page to a number that you can be directly reached. 4. If you still have difficulty reaching the provider, please page the [facility] (Director on Call) for the Hospitalists listed on amion for assistance.

## 2020-01-22 NOTE — Consult Note (Addendum)
Cardiology Consultation:   Patient ID: Antonio Cobb MRN: [id number]; DOB: [birth year]  Admit date: 01/21/2020 Date of Consult: 01/22/2020  Primary Care Provider: [name], MD Primary Cardiologist: [name], MD  Primary Electrophysiologist:  None    Patient Profile:   Antonio Cobb is a 59 y.o. male with a hx of CAD, ICM EF 25% who is being seen today for the evaluation of CHF at the request of Dr. Wynetta Emery.  History of Present Illness:   Antonio Cobb  With history of CAD (s/p STEMI in 2013 with DES to LCx, patent by repeat cath in 05/2016 with moderate residual CAD along RCA and LAD), chronic combined systolic and diastolic CHF (EF [id number] by echo in 09/2016, 25-30% in 08/2017 and 25% by most recent echo in 10/2019, s/p ICD placement in 01/2018), HTN and HLD.  Patient was discharged 01/18/20 after overnight admission with CHF. His torsemide was changed to lasix at patient request. Patient came in last night with acute hypoxic resp failure O2 sats 58% on BiPAP. Hypertensive urgency.  Patient says it always awakens him from sleep. He wonders if panic attacks trigger it. He becomes panicked, BP goes up and then he can't breath. He has been following low salt diet and taking his meds. Denies any leg swelling which is usually how he know. BP high in ED.   Heart Pathway Score:     Past Medical History:  Diagnosis Date  . Alcohol abuse   . CAD (coronary artery disease)    a. s/p inferolateral STEMI in 05/2012 with DES to LCx b. patent stent by cath in 05/2016 with moderate RCA and LAD disease  . Cardiomyopathy ([place])    a. LVEF 30-35% by echo in 09/2016. b. 08/2017: echo showing EF remains reduced at 25-30% c. s/p [employer] ICD placement in 01/2018  . CHF (congestive heart failure) ([place])   . COPD (chronic obstructive pulmonary disease) ([place])   . Diabetes mellitus, type 2 ([place])   . Essential hypertension   . Mitral regurgitation    Moderate  . Mixed  hyperlipidemia   . Myocardial infarction ([place]) 05/23/2012   Inferolateral STEMI  . Obesity     Past Surgical History:  Procedure Laterality Date  . CARDIAC CATHETERIZATION  2 yrs ago  . CARDIAC CATHETERIZATION N/A 05/15/2016   Procedure: Right/Left Heart Cath and Coronary Angiography;  Surgeon: [name], MD;  Location: [place] [facility];  Service: Cardiovascular;  Laterality: N/A;  . CORONARY STENT PLACEMENT  05/23/12  . ICD IMPLANT N/A 02/01/2018   Procedure: ICD IMPLANT;  Surgeon: Evans Lance, MD;  Location: [place] [facility];  Service: Cardiovascular;  Laterality: N/A;  . LEFT HEART CATHETERIZATION WITH CORONARY ANGIOGRAM N/A 05/23/2012   Procedure: LEFT HEART CATHETERIZATION WITH CORONARY ANGIOGRAM;  Surgeon: [name], MD;  Location: [facility] CATH LAB;  Service: Cardiovascular;  Laterality: N/A;  . None    . PERCUTANEOUS CORONARY STENT INTERVENTION (PCI-S) N/A 05/23/2012   Procedure: PERCUTANEOUS CORONARY STENT INTERVENTION (PCI-S);  Surgeon: [name], MD;  Location: [facility] CATH LAB;  Service: Cardiovascular;  Laterality: N/A;  . POLYPECTOMY  10/16/2011   Procedure: POLYPECTOMY;  Surgeon: [name], MD;  Location: [facility];  Service: Endoscopy;;  Polypoid Lesion, Transverse and Sigmoid Colon     Home Medications:  Prior to Admission medications   Medication Sig Start Date End Date Taking? Authorizing Provider  albuterol (VENTOLIN HFA) 108 (90 Base) MCG/ACT inhaler Inhale 2 puffs into the lungs  every 6 (six) hours as needed for wheezing or shortness of breath. 11/20/19  Yes Roxan Hockey, MD  aspirin EC 81 MG EC tablet Take 1 tablet (81 mg total) by mouth daily with breakfast. 11/20/19  Yes Emokpae, Courage, MD  busPIRone (BUSPAR) 10 MG tablet Take 1 tablet (10 mg total) by mouth 3 (three) times daily. For anxiety 11/20/19  Yes Emokpae, Courage, MD  furosemide (LASIX) 40 MG tablet Take 1 tablet (40 mg total) by mouth 2 (two) times daily. 01/18/20 01/17/21 Yes Gosrani,  Nimish C, MD  glucose blood (FREESTYLE LITE) test strip TEST FOUR TIMES DAILY 10/05/19  Yes [name], MD  hydrOXYzine (ATARAX/VISTARIL) 25 MG tablet Take one tablet by mouth at bedtime, for anxiety and sleep Patient taking differently: Take 25 mg by mouth at bedtime as needed. Take one tablet by mouth at bedtime, for anxiety and sleep 12/04/19  Yes [name], MD  insulin regular (NOVOLIN R,HUMULIN R) 100 units/mL injection Inject 0.1 mLs (10 Units total) into the skin 3 (three) times daily before meals. 03/27/19  Yes Nida, Marella Chimes, MD  ipratropium-albuterol (DUONEB) 0.5-2.5 (3) MG/3ML SOLN Take 3 mLs by nebulization every 6 (six) hours as needed. 11/20/19  Yes Emokpae, Courage, MD  LANTUS SOLOSTAR 100 UNIT/ML Solostar Pen ADMINISTER 50 UNITS UNDER THE SKIN EVERY NIGHT AT BEDTIME Patient taking differently: Inject 50 Units into the skin every evening.  08/14/19  Yes Nida, Marella Chimes, MD  losartan (COZAAR) 25 MG tablet Take 1 tablet (25 mg total) by mouth daily. 11/20/19  Yes Emokpae, Courage, MD  metoprolol succinate (TOPROL-XL) 50 MG 24 hr tablet TAKE 1 TABLET BY MOUTH EVERY DAY WITH FOOD Patient taking differently: Take 50 mg by mouth daily. TAKE 1 TABLET BY MOUTH EVERY DAY WITH FOOD 11/20/19  Yes Emokpae, Courage, MD  mometasone-formoterol (DULERA) 100-5 MCG/ACT AERO Inhale 2 puffs into the lungs 2 (two) times daily. 11/20/19  Yes Emokpae, Courage, MD  Multiple Vitamins-Minerals (MULTIVITAMIN WITH MINERALS) tablet Take 1 tablet by mouth daily. 11/20/19 11/19/20 Yes Emokpae, Courage, MD  nitroGLYCERIN (NITROSTAT) 0.4 MG SL tablet Place 1 tablet (0.4 mg total) under the tongue every 5 (five) minutes as needed for chest pain. 05/29/16 01/21/20 Yes Lendon Colonel, NP  potassium chloride SA (KLOR-CON) 20 MEQ tablet Take 1 tablet (20 mEq total) by mouth daily. 11/20/19  Yes Emokpae, Courage, MD  rosuvastatin (CRESTOR) 40 MG tablet Take 1 tablet (40 mg total) by mouth daily.  11/20/19  Yes Emokpae, Courage, MD  sertraline (ZOLOFT) 25 MG tablet Take 1 tablet (25 mg total) by mouth daily. For anxiety/panic attacks 11/20/19 11/19/20 Yes Roxan Hockey, MD  spironolactone (ALDACTONE) 25 MG tablet Take 1 tablet (25 mg total) by mouth daily. 11/20/19  Yes Emokpae, Courage, MD  umeclidinium bromide (INCRUSE ELLIPTA) 62.5 MCG/INH AEPB Inhale 1 puff into the lungs daily. 11/20/19  Yes Roxan Hockey, MD    Inpatient Medications: Scheduled Meds: . aspirin EC  81 mg Oral Q breakfast  . busPIRone  10 mg Oral TID  . enoxaparin (LOVENOX) injection  40 mg Subcutaneous Q24H  . furosemide  40 mg Intravenous Daily  . insulin aspart  0-5 Units Subcutaneous QHS  . insulin aspart  0-9 Units Subcutaneous TID WC  . insulin aspart  8 Units Subcutaneous TID WC  . metoprolol succinate  50 mg Oral Daily  . mometasone-formoterol  2 puff Inhalation BID  . multivitamin with minerals  1 tablet Oral Daily  . rosuvastatin  40  mg Oral Daily  . sertraline  25 mg Oral Daily  . sodium chloride flush  3 mL Intravenous Q12H   Continuous Infusions: . sodium chloride     PRN Meds: sodium chloride, acetaminophen, ipratropium-albuterol, sodium chloride flush  Allergies:    Allergies  Allergen Reactions  . Entresto [Sacubitril-Valsartan]     Dizziness, Coughing  . Other     Seasonal allergies  - has to use inhaler    Social History:   Social History   Socioeconomic History  . Marital status: Married    Spouse name: Not on file  . Number of children: Not on file  . Years of education: Not on file  . Highest education level: Not on file  Occupational History  . Occupation: Therapist, sports: [employer]  Tobacco Use  . Smoking status: Former Smoker    Packs/day: 0.25    Years: 30.00    Pack years: 7.50    Types: Cigars    Quit date: 05/23/2016    Years since quitting: 3.6  . Smokeless tobacco: Never Used  . Tobacco comment: since early 55s  Substance and Sexual  Activity  . Alcohol use: Yes    Alcohol/week: 0.0 standard drinks    Comment: occ  . Drug use: No  . Sexual activity: Not Currently  Other Topics Concern  . Not on file  Social History Narrative  . Not on file   Social Determinants of Health   Financial Resource Strain:   . Difficulty of Paying Living Expenses: Not on file  Food Insecurity:   . Worried About Charity fundraiser in the Last Year: Not on file  . Ran Out of Food in the Last Year: Not on file  Transportation Needs:   . Lack of Transportation (Medical): Not on file  . Lack of Transportation (Non-Medical): Not on file  Physical Activity:   . Days of Exercise per Week: Not on file  . Minutes of Exercise per Session: Not on file  Stress:   . Feeling of Stress : Not on file  Social Connections:   . Frequency of Communication with Friends and Family: Not on file  . Frequency of Social Gatherings with Friends and Family: Not on file  . Attends Religious Services: Not on file  . Active Member of Clubs or Organizations: Not on file  . Attends Archivist Meetings: Not on file  . Marital Status: Not on file  Intimate Partner Violence:   . Fear of Current or Ex-Partner: Not on file  . Emotionally Abused: Not on file  . Physically Abused: Not on file  . Sexually Abused: Not on file    Family History:     Family History  Problem Relation Age of Onset  . Emphysema Mother   . Heart attack Mother   . Hypertension Mother   . Diabetes Mother   . Heart disease Mother   . COPD Mother   . Emphysema Father   . COPD Father   . Stroke Sister   . Asthma Sister   . Colon cancer Neg Hx   . Anesthesia problems Neg Hx   . Hypotension Neg Hx   . Malignant hyperthermia Neg Hx   . Pseudochol deficiency Neg Hx      ROS:  Please see the history of present illness.  Review of Systems  Constitution: Negative.  HENT: Negative.   Cardiovascular: Negative.   Respiratory: Positive for shortness of breath and sleep  disturbances due to breathing.   Endocrine: Negative.   Hematologic/Lymphatic: Negative.   Musculoskeletal: Negative.   Gastrointestinal: Negative.   Genitourinary: Negative.   Neurological: Negative.   Psychiatric/Behavioral: The patient is nervous/anxious.    All other ROS reviewed and negative.     Physical Exam/Data:   Vitals:   01/21/20 1704 01/21/20 2119 01/21/20 2219 01/22/20 0529  BP: 113/74 (!) 155/89  (!) 153/84  Pulse: 79 83 81 85  Resp: 18 19 20 20   Temp: 98.3 F (36.8 C) 98.6 F (37 C)  99 F (37.2 C)  TempSrc: Oral Oral  Oral  SpO2: 100% 97% 96% 96%  Weight: 90.7 kg   92.1 kg  Height: 5\' 10"  (1.778 m)       Intake/Output Summary (Last 24 hours) at 01/22/2020 0809 Last data filed at 01/22/2020 0400 Gross per 24 hour  Intake 125 ml  Output 1000 ml  Net -875 ml   Last 3 Weights 01/22/2020 01/21/2020 01/21/2020  Weight (lbs) 203 lb 0.7 oz 200 lb 196 lb 10.4 oz  Weight (kg) 92.1 kg 90.719 kg 89.2 kg     Body mass index is 29.13 kg/m.  General:  Well nourished, well developed, in no acute distress  HEENT: normal Lymph: no adenopathy Neck: no JVD Endocrine:  No thryomegaly Vascular: No carotid bruits; FA pulses 2+ bilaterally without bruits  Cardiac:  normal S1, S2; RRR; no murmur  Lungs:  clear to auscultation bilaterally, no wheezing, rhonchi or rales  Abd: soft, nontender, no hepatomegaly  Ext: no edema Musculoskeletal:  No deformities, BUE and BLE strength normal and equal Skin: warm and dry  Neuro:  CNs 2-12 intact, no focal abnormalities noted Psych:  Normal affect   EKG:  The EKG was personally reviewed and demonstrates:  Sinus tachycardia 103/m PVC, nonspecific ST changes.  Telemetry:  Telemetry was personally reviewed and demonstrates:  NSR with PVC's  Relevant CV Studies:   Echocardiogram: 11/13/2019 IMPRESSIONS    1. Left ventricular ejection fraction, by visual estimation, is 25%. The left ventricle has severely decreased function. There is  mildly increased left ventricular hypertrophy.  2. Multiple segmental abnormalities exist. See findings.  3. Left ventricular diastolic parameters are indeterminate.  4. Moderately dilated left ventricular internal cavity size.  5. Global right ventricle has normal systolic function.The right ventricular size is normal. No increase in right ventricular wall thickness.  6. Left atrial size was normal.  7. Right atrial size was normal.  8. Presence of pericardial fat pad.  9. Mild to moderate aortic valve annular calcification. 10. The mitral valve is grossly normal. Mild mitral valve regurgitation. 11. The tricuspid valve is grossly normal. Tricuspid valve regurgitation is trivial. 12. The aortic valve is tricuspid. Aortic valve regurgitation is trivial. 13. The pulmonic valve was grossly normal. Pulmonic valve regurgitation is not visualized. 14. TR signal is inadequate for assessing pulmonary artery systolic pressure. 15. A pacer wire is visualized. 16. The inferior vena cava is normal in size with greater than 50% respiratory variability, suggesting right atrial pressure of 3 mmHg.   Cath 05/2012 PROCEDURAL FINDINGS   Hemodynamics:   AO 119/78   LV 121/71   Coronary angiography:   Coronary dominance: right   Left mainstem: The left main is patent with diffuse nonobstructive plaque. The distal left main has 30% stenosis.   Left anterior descending (LAD): the LAD is patent to the LV apex. The mid LAD has 50-60% stenosis at the origin of the second diagonal branch. The  first diagonal is very large in caliber and has no significant obstructive disease.   Left circumflex (LCx): the left circumflex is totally occluded in the mid vessel. There is TIMI 0 flow. Following PCI, there were 2 obtuse marginals and a left posterolateral branch visualized, all of which are patent.   Right coronary artery (RCA): there is a high anterior origin of the RCA. The mid vessel has diffuse plaque estimated at  50-60%. The vessel is dominant. There is diffuse nonobstructive disease throughout. There is a small PDA and small posterolateral branch.   Left ventriculography: Left ventricular systolic function is in the low-normal range, LVEF is estimated at 55%, there is hypokinesis of the basal and midinferior wall.   PCI Note: Following the diagnostic procedure, the decision was made to proceed with PCI. The patient was loaded with Effient 60 mg. Weight-based bivalirudin was given for anticoagulation. Once a therapeutic ACT was achieved, a 6 [place] XB-LAD guide catheter was inserted. A Cougar coronary guidewire was used to cross the lesion. The lesion was predilated with a 2.5x15 mm balloon. The lesion was then stented with a 3.5x15 mm Resolute drug-eluting stent. Following PCI, there was 0% residual stenosis and TIMI-3 flow. Final angiography confirmed an excellent result. The patient tolerated the procedure well. There were no immediate procedural complications. A TR band was used for radial hemostasis. The patient was transferred to the post catheterization recovery area for further monitoring.   PCI Data:   Vessel - circumflex /Segment - mid   Percent Stenosis (pre) 100   TIMI-flow 0   Stent 3.5 x 15 mm resolute integrity DES   Percent Stenosis (post) 0   TIMI-flow (post) 3   Final Conclusions:   1. Total occlusion of the left circumflex with successful primary PCI using a drug-eluting stent platform   2. Diffuse nonobstructive disease of the right coronary artery and LAD   3. Mild left ventricular dysfunction consistent with inferior MI   Recommendations:   transfer to ICU. Post MI medical therapy will be instituted. Tobacco cessation counseling will be done. Anticipate 48 hour hospitalization if his post MI course is uncomplicated  Laboratory Data:  High Sensitivity Troponin:   Recent Labs  Lab 01/17/20 0226 01/17/20 0509 01/21/20 0615 01/21/20 0936  TROPONINIHS 16 36* 12 31*      Chemistry Recent Labs  Lab 01/18/20 0539 01/21/20 0615 01/22/20 0544  NA 143 139 142  K 3.6 3.6 4.3  CL 100 101 108  CO2 29 23 24   GLUCOSE 126* 265* 154*  BUN 29* 27* 28*  CREATININE 1.46* 1.57* 1.36*  CALCIUM 9.6 8.9 9.1  GFRNONAA 52* 48* 57*  GFRAA >60 55* >60  ANIONGAP 14 15 10     Recent Labs  Lab 01/17/20 0226 01/21/20 0615  PROT 7.6 8.1  ALBUMIN 3.9 4.0  AST 26 27  ALT 27 27  ALKPHOS 110 110  BILITOT 0.7 0.8   Hematology Recent Labs  Lab 01/17/20 0226 01/17/20 0509 01/21/20 0615  WBC 17.0* 13.3* 13.0*  RBC 5.34 5.09 5.42  HGB 15.7 14.5 15.4  HCT 49.8 46.8 49.9  MCV 93.3 91.9 92.1  MCH 29.4 28.5 28.4  MCHC 31.5 31.0 30.9  RDW 12.4 12.3 12.2  PLT 305 251 282   BNP Recent Labs  Lab 01/17/20 0226 01/21/20 0615  BNP 517.0* 300.0*     Radiology/Studies:  DG Chest Port 1 View  Result Date: 01/21/2020 CLINICAL DATA:  59 year old male with hypoxia and shortness of breath. EXAM:  PORTABLE CHEST 1 VIEW COMPARISON:  Portable chest 01/18/2020 and earlier. FINDINGS: Portable AP upright view at 0624 hours. Stable lung volumes and mediastinal contours. Stable left chest AICD. Stable ventilation since yesterday with residual bilateral interstitial opacity, improved compared to 01/17/2020. Superimposed chronic right upper lobe scarring. No pneumothorax. No pleural effusion is evident. Visualized tracheal air column is within normal limits. Negative visible bowel gas pattern. No acute osseous abnormality identified. IMPRESSION: Stable chest since yesterday, decreased but not resolved diffuse pulmonary interstitial opacity since 01/17/2020. Electronically Signed   By: Genevie Ann M.D.   On: 01/21/2020 06:45    Assessment and Plan:   1. Acute hypoxic/hypercarbic respiratory failure with flash pulmonary edema and hypertensive urgency-just discharged 01/18/20 with CHF and torsemide changed to lasix. I/O's negative 875 cc on lasix 40 mg daily bid.(did receive IV in ED)  Patient  had no swelling, didn't miss meds and following a low salt diet.  States episodes always occur at nighttime. He is resting comfortably off O2. BP controlled.now on oral lasix, Toprol XL. Need to consider ischemia in overall picture though no chest pain and troponins flat, no EKG changes. Will discuss with MD. 2. ICM EF 25% on echo 10/2019 s/p ICD, intolerant to entresto 3. CAD STEMI 2013 DES Cfx, cath 2017 mod residual CAD RCA and LAD on ASA, BB, statin-no angina 4. AICD followed by Dr. Lovena Le 5. HTN BP up this am but controlled all day yest. And actually got low in [id number] systolic. Hasn't had meds yet.will not adjust at this time. 6. HLD on crestor 7. IDDM -uncontrolled A1C 9.7 8. History of ETOH-quit in December 9. CKD stage 3 Crt 1.36    For questions or updates, please contact [place] Please consult [email or web address] for contact info under    Signed, Ermalinda Barrios, PA-C  01/22/2020 8:09 AM    Attending note:  Patient seen and examined.  I reviewed the chart and discussed the case with Ms. Dallie Dad.  Mr. Gaebler presents to the hospital with shortness of breath associated with hypertensive urgency and acute hypoxic/hypercarbic respiratory failure with pulmonary edema by chest x-ray.    This has been a recurring pattern for him despite reported medication compliance and denying excess salt or fluid intake as an outpatient.  He states that episodes always occur at nighttime, he wakes up and sits on the side of the bed, feels "hot" with tingling in his hands and feet, turns on a fan as he is becoming more short of breath.  No reported recurring angina symptoms at baseline, never has these episodes during daytime hours.  States that he was tested for obstructive sleep apnea many years ago.  No sudden dizziness or syncope.  On examination this morning he appears comfortable, no active symptoms.  He is afebrile, systolic in the [id number], heart rate in the 80s in sinus rhythm by telemetry which I  personally reviewed.  Out more than and by 600 cc on IV Lasix.  Lungs exhibit a few basilar crackles, no wheezing.  Cardiac exam with RRR no gallop.  No peripheral edema.  Lab work shows potassium 4.3, BUN 28, creatinine 1.36, high-sensitivity troponin I levels 12 and 31, BNP 300.  Follow-up chest x-ray today reports interval improvement in pulmonary edema.  ECG from February 7 shows sinus tachycardia with PVC.  Episodic, nocturnal episodes of shortness of breath associated ultimately with findings of hypertensive urgency and acute hypoxic/hypercarbic respiratory failure.  Etiology is not entirely clear.  He does not report  exertional symptoms during the daytime to necessarily suspect an ischemic trigger, but this is a consideration in light of his history.  May need improvement in baseline "dry weight."  Would question obstructive sleep apnea, follow-up sleep testing is indicated.  Could also potentially be arrhythmogenic, and he might benefit from further outpatient monitoring.  For now would titrate back towards stable cardiac regimen.  Continue IV Lasix and oral Toprol-XL.  Add back Aldactone and then losartan (or possibly switch to [facility] with LVEF 25%).  Satira Sark, M.D., F.A.C.C.

## 2020-01-22 NOTE — Telephone Encounter (Signed)
[id number]  Unable to complete TOC call because patient is currently re-admitted

## 2020-01-22 NOTE — Progress Notes (Signed)
Pt stated he is scheduled to have a A1C ordered by Dr Moshe Cipro on Tuesday morning. Pt request to have the test ordered with this mornings labs. Message sent to MD with pt request.

## 2020-01-23 ENCOUNTER — Inpatient Hospital Stay (HOSPITAL_COMMUNITY): Payer: BC Managed Care – PPO

## 2020-01-23 DIAGNOSIS — I509 Heart failure, unspecified: Secondary | ICD-10-CM | POA: Diagnosis not present

## 2020-01-23 DIAGNOSIS — I5043 Acute on chronic combined systolic (congestive) and diastolic (congestive) heart failure: Secondary | ICD-10-CM | POA: Diagnosis not present

## 2020-01-23 DIAGNOSIS — I5023 Acute on chronic systolic (congestive) heart failure: Secondary | ICD-10-CM | POA: Diagnosis not present

## 2020-01-23 DIAGNOSIS — R0600 Dyspnea, unspecified: Secondary | ICD-10-CM | POA: Diagnosis not present

## 2020-01-23 DIAGNOSIS — J9601 Acute respiratory failure with hypoxia: Secondary | ICD-10-CM | POA: Diagnosis not present

## 2020-01-23 LAB — BLOOD GAS, ARTERIAL
Acid-base deficit: 1.2 mmol/L (ref 0.0–2.0)
Bicarbonate: 22.8 mmol/L (ref 20.0–28.0)
Delivery systems: POSITIVE
Drawn by: 27407
FIO2: 100
O2 Saturation: 97.3 %
Patient temperature: 37
pCO2 arterial: 46.1 mmHg (ref 32.0–48.0)
pH, Arterial: 7.334 — ABNORMAL LOW (ref 7.350–7.450)
pO2, Arterial: 116 mmHg — ABNORMAL HIGH (ref 83.0–108.0)

## 2020-01-23 LAB — BASIC METABOLIC PANEL
Anion gap: 9 (ref 5–15)
BUN: 27 mg/dL — ABNORMAL HIGH (ref 6–20)
CO2: 23 mmol/L (ref 22–32)
Calcium: 9 mg/dL (ref 8.9–10.3)
Chloride: 107 mmol/L (ref 98–111)
Creatinine, Ser: 1.27 mg/dL — ABNORMAL HIGH (ref 0.61–1.24)
GFR calc Af Amer: >60 mL/min (ref >60)
GFR calc non Af Amer: >60 mL/min (ref >60)
Glucose, Bld: 102 mg/dL — ABNORMAL HIGH (ref 70–99)
Potassium: 3.7 mmol/L (ref 3.5–5.1)
Sodium: 139 mmol/L (ref 135–145)

## 2020-01-23 LAB — GLUCOSE, CAPILLARY
Glucose-Capillary: 138 mg/dL — ABNORMAL HIGH (ref 70–99)
Glucose-Capillary: 141 mg/dL — ABNORMAL HIGH (ref 70–99)
Glucose-Capillary: 197 mg/dL — ABNORMAL HIGH (ref 70–99)
Glucose-Capillary: 134 mg/dL — ABNORMAL HIGH (ref 70–99)
Glucose-Capillary: 125 mg/dL — ABNORMAL HIGH (ref 70–99)

## 2020-01-23 LAB — HEMOGLOBIN A1C
Hgb A1c MFr Bld: 9.5 % — ABNORMAL HIGH (ref 4.8–5.6)
Mean Plasma Glucose: 226 mg/dL

## 2020-01-23 LAB — MAGNESIUM: Magnesium: 2.2 mg/dL (ref 1.7–2.4)

## 2020-01-23 MED ORDER — LOSARTAN POTASSIUM 50 MG PO TABS
25.0000 mg | ORAL_TABLET | Freq: Every day | ORAL | Status: DC
  Administered 2020-01-23 – 2020-01-25 (×3): 25 mg via ORAL
  Filled 2020-01-23 (×3): qty 1

## 2020-01-23 MED ORDER — FUROSEMIDE 10 MG/ML IJ SOLN
40.0000 mg | Freq: Once | INTRAMUSCULAR | Status: AC
  Administered 2020-01-23: 05:00:00 40 mg via INTRAVENOUS

## 2020-01-23 MED ORDER — LORAZEPAM 2 MG/ML IJ SOLN
1.0000 mg | Freq: Once | INTRAMUSCULAR | Status: AC
  Administered 2020-01-23: 03:00:00 1 mg via INTRAVENOUS
  Filled 2020-01-23: qty 1

## 2020-01-23 MED ORDER — FUROSEMIDE 10 MG/ML IJ SOLN
40.0000 mg | Freq: Two times a day (BID) | INTRAMUSCULAR | Status: DC
  Administered 2020-01-23 – 2020-01-24 (×2): 40 mg via INTRAVENOUS
  Filled 2020-01-23 (×2): qty 4

## 2020-01-23 NOTE — Progress Notes (Signed)
Pt placed on BIPAP for night time rest tolerating well at this time. 

## 2020-01-23 NOTE — Progress Notes (Signed)
Pt taken off BIPAP and placed on 2L [place]. Pt is in no distress and understands to call for SOB

## 2020-01-23 NOTE — Progress Notes (Addendum)
PROGRESS NOTE [facility]   Antonio Cobb  [id number]  DOB: 1961/07/05  DOA: 01/21/2020 PCP: [name], MD   Brief Admission Hx: 59 y.o. male, with medical history significant ofalcohol abuse, coronary artery disease status post STEMI with DES to left circumflex in 0000000, systolic heart failure with ejection fraction 25 to 30% and is status post ICD, COPD, type 2 diabetes mellitus poorly controlled and insulin-dependent, essential hypertension, hyperlipidemia, chronic kidney disease a stage IIIawho presented to the ER with worsening shortness of breath, patient with recent hospitalization, was discharged 01/18/2020, secondary to congestive heart failure episode, where he was discharged on Lasix oral, patient presents with shortness of breath, developed overnight, no relief with using his albuterol inhaler, he was found to be hypoxic by EMS saturating 58% on room air, respiratory rate of 40, with elevated blood pressure, [id number] systolic, patient did receive nitroglycerin spray x2, and he was started on CPAP, upon presentation to ED.   MDM/Assessment & Plan:   1. Hypertensive urgency-likely exacerbated by acute CHF exacerbation and flash pulmonary edema.  His blood pressure has markedly improved with diuresis.  He has been resumed on his home blood pressure medication regimen.  Continue to follow closely. 2. Acute systolic CHF exacerbation with flash pulmonary edema-patient was given 100 mg IV Lasix in the ED and has diuresed very well.  He is feeling a lot better.  His IV Lasix dose has been increased to 40 mg IV BID.  We will continue to monitor diuresis.  He is not stable to discharge home at this time as he had a severe episode of SOB overnight requiring BIPAP therapy. He needs a sleep study to evaluate for OSA.  We will monitor him to make sure he is stable before discharge.  Cardiology has seen him and they have given recommendations noted in the records. 3. Coronary artery  disease status post stent placement-she remains on aspirin daily.  No acute findings on chest x-ray and troponins have been stable.  Continue telemetry monitoring. 4. Essential hypertension-he has been resumed on home blood pressure medications and will follow. 5. Hyperlipidemia with goal LDL less than 70-he is on Crestor daily which we will continue. 6. Type 2 diabetes mellitus, insulin requiring-poorly controlled as evidenced by hemoglobin A1c of 10.2%.  He has been restarted on home Lantus and sliding scale coverage.  He may require prandial coverage.  We will monitor his oral intake and make a decision about prandial coverage. 7. Chronic alcoholism-he has been counseled on cessation and will monitor for signs of withdrawal. 8. Stage III CKD-monitor renal function while diuresing with IV Lasix. 9. COPD-stable.  He reports that he no longer takes regular schedule respiratory bronchodilators.  DVT prophylaxis: Lovenox Code Status: Full Family Communication: Patient determined to have full capacity, counseled at bedside and verbalizes understanding. Disposition Plan: Continue inpatient management for IV Lasix diuresis, subspecialty consultation, telemetry monitoring.  Consultants:  Cardiology   Procedures:    Antimicrobials:     Subjective: Patient reported that he had severe SOB overnight requiring BIPAP.   Objective: Vitals:   01/23/20 0615 01/23/20 0740 01/23/20 0811 01/23/20 1406  BP: 130/86  126/88 108/75  Pulse: 79  81 80  Resp: 16  18 18   Temp:      TempSrc:      SpO2: 100% 97% 97% 99%  Weight:      Height:        Intake/Output Summary (Last 24 hours) at 01/23/2020 1829 Last data  filed at 01/23/2020 1500 Gross per 24 hour  Intake 486 ml  Output 1400 ml  Net -914 ml   Filed Weights   01/21/20 0611 01/21/20 1704 01/22/20 0529  Weight: 89.2 kg 90.7 kg 92.1 kg   REVIEW OF SYSTEMS  As per history otherwise all reviewed and reported negative  Exam:  General  exam: Awake alert cooperative sitting up in the bed in no apparent distress. Respiratory system: Bilateral breath sounds bibasilar crackles unchanged, no increased work of breathing. Cardiovascular system: S1 & S2 heard.  Gastrointestinal system: Abdomen is nondistended, soft and nontender. Normal bowel sounds heard. Central nervous system: Alert and oriented. No focal neurological deficits. Extremities: trace pretibial edema bilateral LEs.   Data Reviewed: Basic Metabolic Panel: Recent Labs  Lab 01/17/20 0509 01/18/20 0539 01/21/20 0615 01/22/20 0544 01/23/20 0137  NA 140 143 139 142 139  K 4.3 3.6 3.6 4.3 3.7  CL 97* 100 101 108 107  CO2 30 29 23 24 23   GLUCOSE 336* 126* 265* 154* 102*  BUN 30* 29* 27* 28* 27*  CREATININE 1.74* 1.46* 1.57* 1.36* 1.27*  CALCIUM 9.5 9.6 8.9 9.1 9.0  MG 2.3  --  2.4  --  2.2   Liver Function Tests: Recent Labs  Lab 01/17/20 0226 01/21/20 0615  AST 26 27  ALT 27 27  ALKPHOS 110 110  BILITOT 0.7 0.8  PROT 7.6 8.1  ALBUMIN 3.9 4.0   No results for input(s): LIPASE, AMYLASE in the last 168 hours. No results for input(s): AMMONIA in the last 168 hours. CBC: Recent Labs  Lab 01/17/20 0226 01/17/20 0509 01/21/20 0615  WBC 17.0* 13.3* 13.0*  NEUTROABS 5.5  --  5.4  HGB 15.7 14.5 15.4  HCT 49.8 46.8 49.9  MCV 93.3 91.9 92.1  PLT 305 251 282   Cardiac Enzymes: No results for input(s): CKTOTAL, CKMB, CKMBINDEX, TROPONINI in the last 168 hours. CBG (last 3)  Recent Labs    01/23/20 0714 01/23/20 1101 01/23/20 1757  GLUCAP 141* 197* 134*   Recent Results (from the past 240 hour(s))  Respiratory Panel by RT PCR (Flu A&B, Covid) - Nasopharyngeal Swab     Status: None   Collection Time: 01/17/20  3:48 AM   Specimen: Nasopharyngeal Swab  Result Value Ref Range Status   SARS Coronavirus 2 by RT PCR NEGATIVE NEGATIVE Final    Comment: (NOTE) SARS-CoV-2 target nucleic acids are NOT DETECTED. The SARS-CoV-2 RNA is generally detectable  in upper respiratoy specimens during the acute phase of infection. The lowest concentration of SARS-CoV-2 viral copies this assay can detect is 131 copies/mL. A negative result does not preclude SARS-Cov-2 infection and should not be used as the sole basis for treatment or other patient management decisions. A negative result may occur with  improper specimen collection/handling, submission of specimen other than nasopharyngeal swab, presence of viral mutation(s) within the areas targeted by this assay, and inadequate number of viral copies (<131 copies/mL). A negative result must be combined with clinical observations, patient history, and epidemiological information. The expected result is Negative. Fact Sheet for Patients:  PinkCheek.be Fact Sheet for Healthcare Providers:  GravelBags.it This test is not yet ap proved or cleared by the [place] FDA and  has been authorized for detection and/or diagnosis of SARS-CoV-2 by FDA under an Emergency Use Authorization (EUA). This EUA will remain  in effect (meaning this test can be used) for the duration of the COVID-19 declaration under Section 564(b)(1) of the Act,  21 U.S.C. section 360bbb-3(b)(1), unless the authorization is terminated or revoked sooner.    Influenza A by PCR NEGATIVE NEGATIVE Final   Influenza B by PCR NEGATIVE NEGATIVE Final    Comment: (NOTE) The Xpert Xpress SARS-CoV-2/FLU/RSV assay is intended as an aid in  the diagnosis of influenza from Nasopharyngeal swab specimens and  should not be used as a sole basis for treatment. Nasal washings and  aspirates are unacceptable for Xpert Xpress SARS-CoV-2/FLU/RSV  testing. Fact Sheet for Patients: PinkCheek.be Fact Sheet for Healthcare Providers: GravelBags.it This test is not yet approved or cleared by the [place] FDA and  has been authorized for  detection and/or diagnosis of SARS-CoV-2 by  FDA under an Emergency Use Authorization (EUA). This EUA will remain  in effect (meaning this test can be used) for the duration of the  Covid-19 declaration under Section 564(b)(1) of the Act, 21  U.S.C. section 360bbb-3(b)(1), unless the authorization is  terminated or revoked. Performed at [facility], [address] [place], [place] [address]   MRSA PCR Screening     Status: None   Collection Time: 01/17/20  6:33 AM   Specimen: Nasopharyngeal  Result Value Ref Range Status   MRSA by PCR NEGATIVE NEGATIVE Final    Comment:        The GeneXpert MRSA Assay (FDA approved for NASAL specimens only), is one component of a comprehensive MRSA colonization surveillance program. It is not intended to diagnose MRSA infection nor to guide or monitor treatment for MRSA infections. Performed at [facility], [address], [place], [place] [address]   Respiratory Panel by RT PCR (Flu A&B, Covid) - Nasopharyngeal Swab     Status: None   Collection Time: 01/21/20  9:43 AM   Specimen: Nasopharyngeal Swab  Result Value Ref Range Status   SARS Coronavirus 2 by RT PCR NEGATIVE NEGATIVE Final    Comment: (NOTE) SARS-CoV-2 target nucleic acids are NOT DETECTED. The SARS-CoV-2 RNA is generally detectable in upper respiratoy specimens during the acute phase of infection. The lowest concentration of SARS-CoV-2 viral copies this assay can detect is 131 copies/mL. A negative result does not preclude SARS-Cov-2 infection and should not be used as the sole basis for treatment or other patient management decisions. A negative result may occur with  improper specimen collection/handling, submission of specimen other than nasopharyngeal swab, presence of viral mutation(s) within the areas targeted by this assay, and inadequate number of viral copies (<131 copies/mL). A negative result must be combined with clinical observations, patient history, and  epidemiological information. The expected result is Negative. Fact Sheet for Patients:  PinkCheek.be Fact Sheet for Healthcare Providers:  GravelBags.it This test is not yet ap proved or cleared by the [place] FDA and  has been authorized for detection and/or diagnosis of SARS-CoV-2 by FDA under an Emergency Use Authorization (EUA). This EUA will remain  in effect (meaning this test can be used) for the duration of the COVID-19 declaration under Section 564(b)(1) of the Act, 21 U.S.C. section 360bbb-3(b)(1), unless the authorization is terminated or revoked sooner.    Influenza A by PCR NEGATIVE NEGATIVE Final   Influenza B by PCR NEGATIVE NEGATIVE Final    Comment: (NOTE) The Xpert Xpress SARS-CoV-2/FLU/RSV assay is intended as an aid in  the diagnosis of influenza from Nasopharyngeal swab specimens and  should not be used as a sole basis for treatment. Nasal washings and  aspirates are unacceptable for Xpert Xpress SARS-CoV-2/FLU/RSV  testing. Fact Sheet for  Patients: PinkCheek.be Fact Sheet for Healthcare Providers: GravelBags.it This test is not yet approved or cleared by the [place] FDA and  has been authorized for detection and/or diagnosis of SARS-CoV-2 by  FDA under an Emergency Use Authorization (EUA). This EUA will remain  in effect (meaning this test can be used) for the duration of the  Covid-19 declaration under Section 564(b)(1) of the Act, 21  U.S.C. section 360bbb-3(b)(1), unless the authorization is  terminated or revoked. Performed at [facility], [address], [place], [place] [address]      Studies: DG CHEST PORT 1 VIEW  Result Date: 01/23/2020 CLINICAL DATA:  CHF exacerbation last night, shortness of chest tightness (now resolved) EXAM: PORTABLE CHEST 1 VIEW COMPARISON:  01/22/2020 FINDINGS: Increased interstitial  markings/pulmonary vascular congestion in the bilateral upper lobes. No focal consolidation. No pleural effusion or pneumothorax. The heart is normal in size.  Left subclavian ICD. IMPRESSION: Mild residual increased interstitial markings/pulmonary vascular congestion in the bilateral upper lobes, unchanged. No frank interstitial edema. Electronically Signed   By: Julian Hy M.D.   On: 01/23/2020 10:14   DG CHEST PORT 1 VIEW  Result Date: 01/22/2020 CLINICAL DATA:  Cough and intermittent shortness of breath EXAM: PORTABLE CHEST 1 VIEW COMPARISON:  01/21/2020 FINDINGS: There is a left chest wall ICD with lead in the right ventricle. Heart size normal. Interval decrease in interstitial opacities identified on the previous exam. Scar noted within the right upper lobe. IMPRESSION: 1. Interval improvement in previously noted pulmonary edema pattern. Electronically Signed   By: Kerby Moors M.D.   On: 01/22/2020 09:37   Scheduled Meds: . aspirin EC  81 mg Oral Q breakfast  . busPIRone  10 mg Oral TID  . enoxaparin (LOVENOX) injection  40 mg Subcutaneous Q24H  . furosemide  40 mg Intravenous BID  . insulin aspart  0-5 Units Subcutaneous QHS  . insulin aspart  0-9 Units Subcutaneous TID WC  . insulin aspart  8 Units Subcutaneous TID WC  . insulin glargine  30 Units Subcutaneous Q2200  . losartan  25 mg Oral Daily  . metoprolol succinate  50 mg Oral Daily  . multivitamin with minerals  1 tablet Oral Daily  . rosuvastatin  40 mg Oral Daily  . sertraline  25 mg Oral Daily  . sodium chloride flush  3 mL Intravenous Q12H   Continuous Infusions: . sodium chloride     Active Problems:   Essential hypertension   Acute on chronic combined systolic and diastolic CHF (congestive heart failure) (HCC)   Acute respiratory failure with hypoxia and hypercapnia (HCC)   DM type 2 causing vascular disease (HCC)   CAD (coronary artery disease)- s/p STEMI with DES to Lt Cir in 2013/S/p AICD 01/2018   Acute  CHF (congestive heart failure) ([place])   Dyspnea   Hypertensive urgency  Time spent:   Irwin Brakeman, MD [facility] 01/23/2020, 6:29 PM    LOS: 2 days  How to contact the [facility] Attending or Consulting provider [place] or covering provider during after hours [place], for this patient?  1. Check the care team in [facility] and look for a) attending/consulting TR[facility] provider listed and b) the [facility] team listed 2. Log into [email or web address] and use [place]'s universal password to access. If you do not have the password, please contact the hospital operator. 3. Locate the [facility] provider you are looking for under [facility] and page to a number that you can be directly  reached. 4. If you still have difficulty reaching the provider, please page the [facility] (Director on Call) for the Hospitalists listed on amion for assistance.

## 2020-01-23 NOTE — Progress Notes (Signed)
RT called to patient room for decreased O2 sats and increased work of breathing.  Patients O2 sats were 80%, patient was in visible distress and diaphoretic.  Pt placed on full face bipap 17/10 100% and an ABG was obtained.  Patient states the Bipap is making him feel better and sats have improved to 99%.  RT will continue to monitor.

## 2020-01-23 NOTE — Progress Notes (Signed)
Informed via cardiac monitoring, pt sustained 16 beats of Vtach. Assessed pt, sleeping at time of occurrence. Pt denies chest pain/discomfort. EKG completed. MD notified. Orders to check magnesium levels at this time. Will continue to monitor patient.

## 2020-01-23 NOTE — Progress Notes (Addendum)
Progress Note  Patient Name: Antonio Cobb Date of Encounter: 01/23/2020  Primary Cardiologist: [name], MD   Subjective   Transient SOB overnight, had to be placed on bipap  Inpatient Medications    Scheduled Meds: . aspirin EC  81 mg Oral Q breakfast  . busPIRone  10 mg Oral TID  . enoxaparin (LOVENOX) injection  40 mg Subcutaneous Q24H  . furosemide  40 mg Intravenous Daily  . insulin aspart  0-5 Units Subcutaneous QHS  . insulin aspart  0-9 Units Subcutaneous TID WC  . insulin aspart  8 Units Subcutaneous TID WC  . insulin glargine  30 Units Subcutaneous Q2200  . metoprolol succinate  50 mg Oral Daily  . multivitamin with minerals  1 tablet Oral Daily  . rosuvastatin  40 mg Oral Daily  . sertraline  25 mg Oral Daily  . sodium chloride flush  3 mL Intravenous Q12H   Continuous Infusions: . sodium chloride     PRN Meds: sodium chloride, acetaminophen, ALPRAZolam, ipratropium-albuterol, sodium chloride flush   Vital Signs    Vitals:   01/23/20 0515 01/23/20 0615 01/23/20 0740 01/23/20 0811  BP: 115/88 130/86  126/88  Pulse: 84 79  81  Resp: 16 16  18   Temp: 98.9 F (37.2 C)     TempSrc: Axillary     SpO2: 100% 100% 97% 97%  Weight:      Height:        Intake/Output Summary (Last 24 hours) at 01/23/2020 1213 Last data filed at 01/23/2020 0928 Gross per 24 hour  Intake 966 ml  Output 800 ml  Net 166 ml   Last 3 Weights 01/22/2020 01/21/2020 01/21/2020  Weight (lbs) 203 lb 0.7 oz 200 lb 196 lb 10.4 oz  Weight (kg) 92.1 kg 90.719 kg 89.2 kg      Telemetry    SR, 16 beat run of NSVT - Personally Reviewed  ECG    n/a - Personally Reviewed  Physical Exam   GEN: No acute distress.   Neck: No JVD Cardiac: RRR, no murmurs, rubs, or gallops.  Respiratory: Clear to auscultation bilaterally. GI: Soft, nontender, non-distended  MS: No edema; No deformity. Neuro:  Nonfocal  Psych: Normal affect   Labs    High Sensitivity Troponin:   Recent  Labs  Lab 01/17/20 0226 01/17/20 0509 01/21/20 0615 01/21/20 0936  TROPONINIHS 16 36* 12 31*      Chemistry Recent Labs  Lab 01/17/20 0226 01/17/20 0509 01/21/20 0615 01/22/20 0544 01/23/20 0137  NA 136   < > 139 142 139  K 3.3*   < > 3.6 4.3 3.7  CL 99   < > 101 108 107  CO2 25   < > 23 24 23   GLUCOSE 361*   < > 265* 154* 102*  BUN 27*   < > 27* 28* 27*  CREATININE 1.83*   < > 1.57* 1.36* 1.27*  CALCIUM 8.8*   < > 8.9 9.1 9.0  PROT 7.6  --  8.1  --   --   ALBUMIN 3.9  --  4.0  --   --   AST 26  --  27  --   --   ALT 27  --  27  --   --   ALKPHOS 110  --  110  --   --   BILITOT 0.7  --  0.8  --   --   GFRNONAA 40*   < > 48* 57* >  60  GFRAA 46*   < > 55* >60 >60  ANIONGAP 12   < > 15 10 9    < > = values in this interval not displayed.     Hematology Recent Labs  Lab 01/17/20 0226 01/17/20 0509 01/21/20 0615  WBC 17.0* 13.3* 13.0*  RBC 5.34 5.09 5.42  HGB 15.7 14.5 15.4  HCT 49.8 46.8 49.9  MCV 93.3 91.9 92.1  MCH 29.4 28.5 28.4  MCHC 31.5 31.0 30.9  RDW 12.4 12.3 12.2  PLT 305 251 282    BNP Recent Labs  Lab 01/17/20 0226 01/21/20 0615  BNP 517.0* 300.0*     DDimer No results for input(s): DDIMER in the last 168 hours.   Radiology    DG CHEST PORT 1 VIEW  Result Date: 01/23/2020 CLINICAL DATA:  CHF exacerbation last night, shortness of chest tightness (now resolved) EXAM: PORTABLE CHEST 1 VIEW COMPARISON:  01/22/2020 FINDINGS: Increased interstitial markings/pulmonary vascular congestion in the bilateral upper lobes. No focal consolidation. No pleural effusion or pneumothorax. The heart is normal in size.  Left subclavian ICD. IMPRESSION: Mild residual increased interstitial markings/pulmonary vascular congestion in the bilateral upper lobes, unchanged. No frank interstitial edema. Electronically Signed   By: Julian Hy M.D.   On: 01/23/2020 10:14   DG CHEST PORT 1 VIEW  Result Date: 01/22/2020 CLINICAL DATA:  Cough and intermittent shortness  of breath EXAM: PORTABLE CHEST 1 VIEW COMPARISON:  01/21/2020 FINDINGS: There is a left chest wall ICD with lead in the right ventricle. Heart size normal. Interval decrease in interstitial opacities identified on the previous exam. Scar noted within the right upper lobe. IMPRESSION: 1. Interval improvement in previously noted pulmonary edema pattern. Electronically Signed   By: Kerby Moors M.D.   On: 01/22/2020 09:37    Cardiac Studies     Patient Profile   Antonio Cobb is a 59 y.o. male with a hx of CAD, ICM EF 25% who is being seen today for the evaluation of CHF at the request of Dr. Wynetta Emery.  Assessment & Plan    1. Acute on chronic systolic HF - [id number] echo LVEF 25-30%, normal RV,  - entresto caused cough and dizzy spells  - admitted with pulm edema, hypoxia.  - I/Os are incomplete. He is on lasix 40mg  IV, received 2 doses today. DOwntrend in Cr with diuresis consistent with venous congestion and CHF - would go back to oral torsemide at discharge. From 11/2019 note was reduced to torsemide 40mg  daily due to uptrend in Cr.  - issues with recurrent admits with volume overload, HTN. He reports medication compliance. Issues seem to arise at night.   - repeat episode last night around 130AM. Increased SOB, sats 83%. Placed on bipap machine. Vitals show bp 220/105. Tele shows sinus rhythm, did have a 16 beat run of NSVT at 1253AM.  AM CXR icnreased pulmonary edema - will need to look into expedited sleep study once he is euvolemic.  - other potential etiologies for recurrent flash pulmonary edema would be ischemia, arrhythmia,  renal artery stenosis. The fact that the episodes are nocturnal would suggest less likely the cause. Will get device checked.    2. CAD - hx of inferolateral STEMI June 2013, received DES to LCX. LVEF at that time 55%.  - echo 04/2016 LVEF 30-35% - 05/2016 cath patent LCX stent, moderate RCA and LAD disease.    3. HTN - admitted with severe HTN -  subsequently had issues with low  bp's, his losartan and aldactone have been held - bps trending back up, restarted losartan  Outpatient needs sleep study, device check.  For questions or updates, please contact [place] Please consult [email or web address] for contact info under        Signed, [name], MD  01/23/2020, 12:13 PM

## 2020-01-23 NOTE — Progress Notes (Addendum)
   Vital Signs MEWS/VS Documentation      01/23/2020 0214 01/23/2020 0220 01/23/2020 0230 01/23/2020 0245   MEWS Score:  5  6  6  1    MEWS Score Color:  Red  Red  Red  Green   Resp:  (!) 24  (!) 26  (!) 26  (!) 23   Pulse:  (!) 118  (!) 122  (!) 120  94   BP:  (!) 220/105  --  --  (!) 144/100   Temp:  98.1 F (36.7 C)  --  --  --   O2 Device:  Nasal Cannula  --  Bi-PAP  Bi-PAP   Level of Consciousness:  --  --  Alert  Alert      Monish Haliburton M Chey Rachels 01/23/2020,2:54 AM Pt woke with increased work of breathing, wheezing, diaphoretic. Stated this has happened in the past and needed Bipap. RT and charge RN notified and present. MD notified. O2 sat 80%. 2L via Advance administered. O2 sat 83%. RT initiated bipap due to decreasing O2 sats. O2 now 100% on bipap, pt states he feels much better. Continuous pulse ox monitoring initiated. Collectively, it was deemed unnecessary to call a rapid or ICU RN due to the patients improvement in a matter of minutes with our interventions.

## 2020-01-24 DIAGNOSIS — I5023 Acute on chronic systolic (congestive) heart failure: Secondary | ICD-10-CM

## 2020-01-24 DIAGNOSIS — J9601 Acute respiratory failure with hypoxia: Secondary | ICD-10-CM

## 2020-01-24 DIAGNOSIS — G4733 Obstructive sleep apnea (adult) (pediatric): Secondary | ICD-10-CM | POA: Diagnosis present

## 2020-01-24 DIAGNOSIS — R05 Cough: Secondary | ICD-10-CM

## 2020-01-24 DIAGNOSIS — J9602 Acute respiratory failure with hypercapnia: Secondary | ICD-10-CM

## 2020-01-24 LAB — BASIC METABOLIC PANEL
Anion gap: 7 (ref 5–15)
BUN: 33 mg/dL — ABNORMAL HIGH (ref 6–20)
CO2: 26 mmol/L (ref 22–32)
Calcium: 8.9 mg/dL (ref 8.9–10.3)
Chloride: 104 mmol/L (ref 98–111)
Creatinine, Ser: 1.29 mg/dL — ABNORMAL HIGH (ref 0.61–1.24)
GFR calc Af Amer: >60 mL/min (ref >60)
GFR calc non Af Amer: >60 mL/min (ref >60)
Glucose, Bld: 134 mg/dL — ABNORMAL HIGH (ref 70–99)
Potassium: 3.9 mmol/L (ref 3.5–5.1)
Sodium: 137 mmol/L (ref 135–145)

## 2020-01-24 LAB — GLUCOSE, CAPILLARY
Glucose-Capillary: 129 mg/dL — ABNORMAL HIGH (ref 70–99)
Glucose-Capillary: 115 mg/dL — ABNORMAL HIGH (ref 70–99)
Glucose-Capillary: 150 mg/dL — ABNORMAL HIGH (ref 70–99)
Glucose-Capillary: 124 mg/dL — ABNORMAL HIGH (ref 70–99)
Glucose-Capillary: 160 mg/dL — ABNORMAL HIGH (ref 70–99)

## 2020-01-24 MED ORDER — TORSEMIDE 20 MG PO TABS
40.0000 mg | ORAL_TABLET | Freq: Every day | ORAL | Status: DC
  Administered 2020-01-25: 09:00:00 40 mg via ORAL
  Filled 2020-01-24: qty 2

## 2020-01-24 MED ORDER — PANTOPRAZOLE SODIUM 40 MG PO TBEC
40.0000 mg | DELAYED_RELEASE_TABLET | Freq: Two times a day (BID) | ORAL | Status: DC
  Administered 2020-01-24 – 2020-01-25 (×2): 40 mg via ORAL
  Filled 2020-01-24 (×2): qty 1

## 2020-01-24 NOTE — Consult Note (Signed)
PULMONARY / CRITICAL CARE MEDICINE   NAME:  Antonio Cobb, MRN:  [id number], DOB:  06/23/1961, LOS: 3 ADMISSION DATE:  01/21/2020, CONSULTATION DATE: 2/10 REFERRING MD:  Harl Bowie, CHIEF COMPLAINT:  Noct sob   BRIEF HISTORY:    58 yobm quit smoking 2013 with PFT min airflow obst c/w GOLD 0 copd with nl dlco and really no limitations on daily living/ working as Dealer but new noct episodes of sob requiring repeat admits x one year improved on cpap so PCCM asked to see pm 2/10 ? Needs sleep study or bipap  Note typical spells occur as follows Wakes up to urinate s resp symptom but walking back to bed develops sob, tingling in hands and inability to speak "like strangling"  - wife confirms goes from nl resp/nl speak to aphonia during these spells. As fare as he knows never on ACEi.  Does have assoc hb he treats with TUMS   No reported snoring / mild daytime hypersomnolence   HISTORY OF PRESENT ILLNESS   59 y.o. male, with medical history significant ofalcohol abuse, coronary artery disease status post STEMI with DES to left circumflex in 0000000, systolic heart failure with ejection fraction 25 to 30% and is status post ICD, COPD, type 2 diabetes mellitus poorly controlled and insulin-dependent, essential hypertension, hyperlipidemia, chronic kidney disease a stage IIIawho presented to the ER with worsening shortness of breath, patient with recent hospitalization, was discharged 01/18/2020, secondary to congestive heart failure episode, where he was discharged on Lasix oral, patient presents with shortness of breath, developed overnight, no relief with using his albuterol inhaler, he was found to be hypoxic by EMS saturating 58% on room air, respiratory rate of 40, with elevated blood pressure, [id number] systolic, patient did receive nitroglycerin spray x2, and he was started on CPAP, upon presentation to ED, he was saturating 96% on CPAP, and blood pressure has improved to 156, patient denies any chest pain,  ports compliance with medication, fever, no chills, reports some cough.  No obvious patterns in terms of variability of noct sob though ? Worse with late meals but really no  assoc excess/ purulent sputum or mucus plugs or hemoptysis or cp or chest tightness, subjective wheeze or overt sinus or hb symptoms.   Wife has noted wheezing at times resting and supine but not aware of any snoring or apneic events.  Marked elevation of bp assoc with spells noted    Also denies any obvious fluctuation of symptoms with weather or environmental changes or other aggravating or alleviating factors except as outlined above   No unusual exposure hx or h/o childhood pna/ asthma or knowledge of premature birth.  Current Allergies, Complete Past Medical History, Past Surgical History, Family History, and Social History were reviewed in [employer] record.  ROS  The following are not active complaints unless bolded Hoarseness, sore throat, dysphagia, dental problems, itching, sneezing,  nasal congestion or discharge of excess mucus or purulent secretions, ear ache,   fever, chills, sweats, unintended wt loss or wt gain, classically pleuritic or exertional cp,  orthopnea pnd or arm/hand swelling  or leg swelling, presyncope, palpitations, abdominal pain, anorexia, nausea, vomiting, diarrhea  or change in bowel habits or change in bladder habits, change in stools or change in urine, dysuria, hematuria,  rash, arthralgias, visual complaints, headache, numbness, weakness or ataxia or problems with walking or coordination,  change in mood or  memory.  STUDIES:   11/13/2019 Echo 1. Left ventricular ejection fraction, by visual estimation, is 25%. The  left ventricle has severely decreased function. There is mildly increased  left ventricular hypertrophy.  2. Multiple segmental abnormalities exist. See findings.  3. Left ventricular diastolic parameters are indeterminate.  4.  Moderately dilated left ventricular internal cavity size.  5. Global right ventricle has normal systolic function.The right  ventricular size is normal. No increase in right ventricular wall  thickness.  6. Left atrial size was normal.  7. Right atrial size was normal.  8. Presence of pericardial fat pad.  9. Mild to moderate aortic valve annular calcification.  10. The mitral valve is grossly normal. Mild mitral valve regurgitation.  11. The tricuspid valve is grossly normal. Tricuspid valve regurgitation  is trivial.  12. The aortic valve is tricuspid. Aortic valve regurgitation is trivial.  13. The pulmonic valve was grossly normal. Pulmonic valve regurgitation is  not visualized.  14. TR signal is inadequate for assessing pulmonary artery systolic  pressure.  15. A pacer wire is visualized.  16. The inferior vena cava is normal in size with greater than 50%  respiratory variability, suggesting right atrial pressure of 3 mmHg.         CONSULTANTS:  Cardiology  2/8  PCCM 2/10   SUBJECTIVE:  NAD at present RA  CONSTITUTIONAL: BP 105/70 (BP Location: Right Arm)   Pulse 74   Temp 98.6 F (37 C)   Resp 19   Ht 5\' 10"  (1.778 m)   Wt 94.8 kg   SpO2 100%   BMI 29.99 kg/m   I/O last 3 completed shifts: In: 71 [P.O.:720; I.V.:6] Out: 1850 [Urine:1850]        PHYSICAL EXAM: General:  Healthy appearing amb bm nad  Neuro:  Alert/ approp/ nl gait  HEENT:  Dentition ok/ orophx clear/ Modified Mallampati Score =  3 No nodes or TM but classic insp stridor present Cardiovascular:  RRR no s3 Lungs:  Clear to and P  Abdomen:  Obese/ soft  Musculoskeletal:  No deformities Skin:  Nl    pCXR  2/9 Mild residual increased interstitial markings/pulmonary vascular congestion in the bilateral upper lobes, unchanged. No frank interstitial edema.      ASSESSMENT AND PLAN   1) severe PND in pt with chf with atypica features as goes from no resp symptoms to can't even  speak in matter of seconds more typical of VCD  2) Mild residual strior at present typical of Upper airway cough syndrome (previously labeled PNDS),  is so named because it's frequently impossible to sort out how much is  CR/sinusitis with freq throat clearing (which can be related to primary GERD)   vs  causing  secondary (" extra esophageal")  GERD from wide swings in gastric pressure that occur with throat clearing, often  promoting self use of mint and menthol lozenges that reduce the lower esophageal sphincter tone and exacerbate the problem further in a cyclical fashion.   These are the same pts (now being labeled as having "irritable larynx syndrome" by some cough centers) who not infrequently have a history of having failed to tolerate ace inhibitors (does not recall acei use) ,  dry powder inhalers (like Incruse) or biphosphonates or report having atypical/extraesophageal reflux symptoms that don't respond to standard doses of PPI  and are easily confused as having aecopd or asthma flares by even experienced allergists/ pulmonologists (myself included).   NB with severe uacs can get vcd and  near strangulation pulmonary edema esp in pt with bad LV   rec max rx for gerd, hold all bronchodilators as this is not copd or even asthma   2) Possible OSA causing bp to spike  - will set up for autoset cpap and f/u in [place] by Alva/ Sood who can see him here in [place] once we are set up for this       LABS  Glucose Recent Labs  Lab 01/23/20 1101 01/23/20 1757 01/23/20 2049 01/24/20 0255 01/24/20 0810 01/24/20 1148  GLUCAP 197* 134* 125* 129* 115* 150*    BMET Recent Labs  Lab 01/22/20 0544 01/23/20 0137 01/24/20 0454  NA 142 139 137  K 4.3 3.7 3.9  CL 108 107 104  CO2 24 23 26   BUN 28* 27* 33*  CREATININE 1.36* 1.27* 1.29*  GLUCOSE 154* 102* 134*    Liver Enzymes Recent Labs  Lab 01/21/20 0615  AST 27  ALT 27  ALKPHOS 110  BILITOT 0.8  ALBUMIN 4.0     Electrolytes Recent Labs  Lab 01/21/20 0615 01/21/20 0615 01/22/20 0544 01/23/20 0137 01/24/20 0454  CALCIUM 8.9   < > 9.1 9.0 8.9  MG 2.4  --   --  2.2  --    < > = values in this interval not displayed.    CBC Recent Labs  Lab 01/21/20 0615  WBC 13.0*  HGB 15.4  HCT 49.9  PLT 282    ABG Recent Labs  Lab 01/23/20 0222  PHART 7.334*  PCO2ART 46.1  PO2ART 116*    Coag's No results for input(s): APTT, INR in the last 168 hours.  Sepsis Markers No results for input(s): LATICACIDVEN, PROCALCITON, O2SATVEN in the last 168 hours.  Cardiac Enzymes No results for input(s): TROPONINI, PROBNP in the last 168 hours.  PAST MEDICAL HISTORY :   He  has a past medical history of Alcohol abuse, CAD (coronary artery disease), Cardiomyopathy ([place]), CHF (congestive heart failure) ([place]), COPD (chronic obstructive pulmonary disease) ([place]), Diabetes mellitus, type 2 ([place]), Essential hypertension, Mitral regurgitation, Mixed hyperlipidemia, Myocardial infarction ([place]) (05/23/2012), and Obesity.  PAST SURGICAL HISTORY:  He  has a past surgical history that includes None; Cardiac catheterization (2 yrs ago); polypectomy (10/16/2011); Coronary stent placement (05/23/12); left heart catheterization with coronary angiogram (N/A, 05/23/2012); percutaneous coronary stent intervention (pci-s) (N/A, 05/23/2012); Cardiac catheterization (N/A, 05/15/2016); and ICD IMPLANT (N/A, 02/01/2018).  Allergies  Allergen Reactions  . Entresto [Sacubitril-Valsartan]     Dizziness, Coughing  . Other     Seasonal allergies  - has to use inhaler    No current facility-administered medications on file prior to encounter.   Current Outpatient Medications on File Prior to Encounter - - NOTE:   Unable to verify as accurately reflecting what pt takes     Medication Sig  . albuterol (VENTOLIN HFA) 108 (90 Base) MCG/ACT inhaler Inhale 2 puffs into the lungs every 6 (six) hours as needed for wheezing or shortness  of breath.  [name] aspirin EC 81 MG EC tablet Take 1 tablet (81 mg total) by mouth daily with breakfast.  . busPIRone (BUSPAR) 10 MG tablet Take 1 tablet (10 mg total) by mouth 3 (three) times daily. For anxiety  . furosemide (LASIX) 40 MG tablet Take 1 tablet (40 mg total) by mouth 2 (two) times daily.  [name] glucose blood (FREESTYLE LITE) test strip TEST FOUR TIMES DAILY  . hydrOXYzine (ATARAX/VISTARIL) 25 MG tablet Take one tablet by mouth at bedtime, for  anxiety and sleep (Patient taking differently: Take 25 mg by mouth at bedtime as needed. Take one tablet by mouth at bedtime, for anxiety and sleep)  . insulin regular (NOVOLIN R,HUMULIN R) 100 units/mL injection Inject 0.1 mLs (10 Units total) into the skin 3 (three) times daily before meals.  [name] ipratropium-albuterol (DUONEB) 0.5-2.5 (3) MG/3ML SOLN Denies taking   . LANTUS SOLOSTAR 100 UNIT/ML Solostar Pen ADMINISTER 50 UNITS UNDER THE SKIN EVERY NIGHT AT BEDTIME (Patient taking differently: Inject 50 Units into the skin every evening. )  . losartan (COZAAR) 25 MG tablet Take 1 tablet (25 mg total) by mouth daily.  . metoprolol succinate (TOPROL-XL) 50 MG 24 hr tablet TAKE 1 TABLET BY MOUTH EVERY DAY WITH FOOD (Patient taking differently: Take 50 mg by mouth daily. TAKE 1 TABLET BY MOUTH EVERY DAY WITH FOOD)  . mometasone-formoterol (DULERA) 100-5 MCG/ACT AERO Denies taking   . Multiple Vitamins-Minerals (MULTIVITAMIN WITH MINERALS) tablet Take 1 tablet by mouth daily.  . nitroGLYCERIN (NITROSTAT) 0.4 MG SL tablet Place 1 tablet (0.4 mg total) under the tongue every 5 (five) minutes as needed for chest pain.  . potassium chloride SA (KLOR-CON) 20 MEQ tablet Take 1 tablet (20 mEq total) by mouth daily.  . rosuvastatin (CRESTOR) 40 MG tablet Take 1 tablet (40 mg total) by mouth daily.  . sertraline (ZOLOFT) 25 MG tablet Take 1 tablet (25 mg total) by mouth daily. For anxiety/panic attacks  . spironolactone (ALDACTONE) 25 MG tablet Take 1 tablet (25 mg  total) by mouth daily.  [name] umeclidinium bromide (INCRUSE ELLIPTA) 62.5 MCG/INH AEPB Idenies taking     FAMILY HISTORY:   His family history includes Asthma in his sister; COPD in his father and mother; Diabetes in his mother; Emphysema in his father and mother; Heart attack in his mother; Heart disease in his mother; Hypertension in his mother; Stroke in his sister. There is no history of Colon cancer, Anesthesia problems, Hypotension, Malignant hyperthermia, or Pseudochol deficiency.  SOCIAL HISTORY:  He  reports that he quit smoking about 3 years ago. His smoking use included cigars. He has a 7.50 pack-year smoking history. He has never used smokeless tobacco. He reports current alcohol use. He reports that he does not use drugs.     Christinia Gully, MD Pulmonary and [facility] [phone] After 5:30 PM or weekends, use [facility] [phone]

## 2020-01-24 NOTE — Progress Notes (Signed)
PROGRESS NOTE  KEYMANI BETTEN [id number] DOB: [birth year] DOA: 01/21/2020 PCP: [name], MD  Brief History:  59 y.o.male,with medical history significant ofalcohol abuse, coronary artery disease status post STEMI with DES to left circumflex in 0000000, systolic heart failure with ejection fraction 25 to 30% and is status post ICD, COPD, type 2 diabetes mellitus poorly controlled and insulin-dependent, essential hypertension, hyperlipidemia, chronic kidney disease a stage IIIawho presented to the ER with worsening shortness of breath,patient with recent hospitalization, was discharged 01/18/2020, secondary to congestive heart failure episode, where he was discharged on Lasix oral, patient presents with shortness of breath, developed overnight, no relief with using his albuterol inhaler, he was found to be hypoxic by EMS saturating 58% on room air, respiratory rate of 40, with elevated blood pressure, [id number] systolic, patient did receive nitroglycerin spray x2, and he was started on CPAP, upon presentation to ED.   Assessment/Plan: Acute hypoxic/hypercarbic respiratory failure -Patient saturating 58% on room air at his house, as well VBG showing PCO2 of 64 -on BiPAP in ED -now weaned to RA -This is most likely in the setting of flash pulmonary edema secondary to hypertensive urgency.  Acute exacerbation of chronic congestive heart failure with reduced ejection fraction: - S/p AICD placement -Patient with recent admission 12/3-12/4, similar presentation, likely in setting of flash pulmonary edema secondary to hypertensive urgency, and acute systolic CHF. - Echocardiogram on 11/13/2019 which showed EF of 25%, severely decreased LV function, moderately dilated LV cavity. -Aldactone, metoprolol and losartan held initially given soft blood pressure. -Continue with daily weights, strict ins and outs. -Consult cardiology--appreciated -IV lasix transition to torsemide  2/11  Nocturnal Oxygen Desaturations - he has been using BiPAP at night this admission. Will need an outpatient sleep study as soon as possible. Reports having one 10+ years ago but no recent studies.  -appreciate pulmonary consult--suspect VCD vs Upper airway cough syndrome-->tx GERD with PPI -trying to arrange auto-CPAP for discharge  Coronary artery disease status post and placement: -Continue with aspirin -He denies any chest pain, EKG nonacute, will cycle troponins, anticipate it will be elevated secondary to demand ischemia, but will continue to follow the trend.  Hypertension: -Soft blood pressure will hold medications initially -losartan and metoprolol succinate restarted  Hyperlipidemia: Continue Crestor  Insulin-dependent diabetes mellitus type 2, uncontrolled with hyperglycemia -01/22/20 A1C--9.5 -Continue reduced dose Lantus -continue sliding scale  History of alcohol abuse: -Counseled about cessation   chronic kidney disease stage IIIa: -Monitor renal function closely -baseline creatinine 1.1-1.4  COPD: -No active wheezing      Disposition Plan:   Home in 1-2 days when cleared by cardio and pulm Family Communication:   Spouse updated at bedside 2/10  Consultants:  Cardiology, pulm  Code Status:  FULL / DNR  DVT Prophylaxis:  [place] Heparin / [place] Lovenox   Procedures: As Listed in Progress Note Above  Antibiotics: None  RN Pressure Injury Documentation:        Subjective:   Objective: Vitals:   01/23/20 2253 01/24/20 0538 01/24/20 0544 01/24/20 1454  BP:  116/82  105/70  Pulse: 92 66  74  Resp:  16  19  Temp:  (!) 97.3 F (36.3 C)  98.6 F (37 C)  TempSrc:  Axillary    SpO2: 100% 100%  100%  Weight:   94.8 kg   Height:        Intake/Output Summary (Last 24 hours) at 01/24/2020 1827 Last  data filed at 01/24/2020 1500 Gross per 24 hour  Intake 480 ml  Output 1750 ml  Net -1270 ml   Weight change:  Exam:   General:  Pt  is alert, follows commands appropriately, not in acute distress  HEENT: No icterus, No thrush, No neck mass, [place]/AT  Cardiovascular: RRR, S1/S2, no rubs, no gallops  Respiratory: CTA bilaterally, no wheezing, no crackles, no rhonchi  Abdomen: Soft/+BS, non tender, non distended, no guarding  Extremities: No edema, No lymphangitis, No petechiae, No rashes, no synovitis   Data Reviewed: I have personally reviewed following labs and imaging studies Basic Metabolic Panel: Recent Labs  Lab 01/18/20 0539 01/21/20 0615 01/22/20 0544 01/23/20 0137 01/24/20 0454  NA 143 139 142 139 137  K 3.6 3.6 4.3 3.7 3.9  CL 100 101 108 107 104  CO2 29 23 24 23 26   GLUCOSE 126* 265* 154* 102* 134*  BUN 29* 27* 28* 27* 33*  CREATININE 1.46* 1.57* 1.36* 1.27* 1.29*  CALCIUM 9.6 8.9 9.1 9.0 8.9  MG  --  2.4  --  2.2  --    Liver Function Tests: Recent Labs  Lab 01/21/20 0615  AST 27  ALT 27  ALKPHOS 110  BILITOT 0.8  PROT 8.1  ALBUMIN 4.0   No results for input(s): LIPASE, AMYLASE in the last 168 hours. No results for input(s): AMMONIA in the last 168 hours. Coagulation Profile: No results for input(s): INR, PROTIME in the last 168 hours. CBC: Recent Labs  Lab 01/21/20 0615  WBC 13.0*  NEUTROABS 5.4  HGB 15.4  HCT 49.9  MCV 92.1  PLT 282   Cardiac Enzymes: No results for input(s): CKTOTAL, CKMB, CKMBINDEX, TROPONINI in the last 168 hours. BNP: Invalid input(s): POCBNP CBG: Recent Labs  Lab 01/23/20 2049 01/24/20 0255 01/24/20 0810 01/24/20 1148 01/24/20 1624  GLUCAP 125* 129* 115* 150* 124*   HbA1C: Recent Labs    01/22/20 0609  HGBA1C 9.5*   Urine analysis:    Component Value Date/Time   COLORURINE COLORLESS (A) 09/06/2017 0452   APPEARANCEUR CLEAR 09/06/2017 0452   LABSPEC 1.006 09/06/2017 0452   PHURINE 5.0 09/06/2017 0452   GLUCOSEU >=500 (A) 09/06/2017 0452   GLUCOSEU NEG mg/dL 03/04/2007 0829   HGBUR NEGATIVE 09/06/2017 0452   HGBUR negative  11/13/2010 1522   BILIRUBINUR NEGATIVE 09/06/2017 0452   KETONESUR NEGATIVE 09/06/2017 0452   PROTEINUR NEGATIVE 09/06/2017 0452   UROBILINOGEN 0.2 03/23/2015 1805   NITRITE NEGATIVE 09/06/2017 0452   LEUKOCYTESUR NEGATIVE 09/06/2017 0452   Sepsis Labs: @LABRCNTIP (procalcitonin:4,lacticidven:4) ) Recent Results (from the past 240 hour(s))  Respiratory Panel by RT PCR (Flu A&B, Covid) - Nasopharyngeal Swab     Status: None   Collection Time: 01/17/20  3:48 AM   Specimen: Nasopharyngeal Swab  Result Value Ref Range Status   SARS Coronavirus 2 by RT PCR NEGATIVE NEGATIVE Final    Comment: (NOTE) SARS-CoV-2 target nucleic acids are NOT DETECTED. The SARS-CoV-2 RNA is generally detectable in upper respiratoy specimens during the acute phase of infection. The lowest concentration of SARS-CoV-2 viral copies this assay can detect is 131 copies/mL. A negative result does not preclude SARS-Cov-2 infection and should not be used as the sole basis for treatment or other patient management decisions. A negative result may occur with  improper specimen collection/handling, submission of specimen other than nasopharyngeal swab, presence of viral mutation(s) within the areas targeted by this assay, and inadequate number of viral copies (<131 copies/mL). A negative result  must be combined with clinical observations, patient history, and epidemiological information. The expected result is Negative. Fact Sheet for Patients:  PinkCheek.be Fact Sheet for Healthcare Providers:  GravelBags.it This test is not yet ap proved or cleared by the [place] FDA and  has been authorized for detection and/or diagnosis of SARS-CoV-2 by FDA under an Emergency Use Authorization (EUA). This EUA will remain  in effect (meaning this test can be used) for the duration of the COVID-19 declaration under Section 564(b)(1) of the Act, 21 U.S.C. section  360bbb-3(b)(1), unless the authorization is terminated or revoked sooner.    Influenza A by PCR NEGATIVE NEGATIVE Final   Influenza B by PCR NEGATIVE NEGATIVE Final    Comment: (NOTE) The Xpert Xpress SARS-CoV-2/FLU/RSV assay is intended as an aid in  the diagnosis of influenza from Nasopharyngeal swab specimens and  should not be used as a sole basis for treatment. Nasal washings and  aspirates are unacceptable for Xpert Xpress SARS-CoV-2/FLU/RSV  testing. Fact Sheet for Patients: PinkCheek.be Fact Sheet for Healthcare Providers: GravelBags.it This test is not yet approved or cleared by the [place] FDA and  has been authorized for detection and/or diagnosis of SARS-CoV-2 by  FDA under an Emergency Use Authorization (EUA). This EUA will remain  in effect (meaning this test can be used) for the duration of the  Covid-19 declaration under Section 564(b)(1) of the Act, 21  U.S.C. section 360bbb-3(b)(1), unless the authorization is  terminated or revoked. Performed at [facility], [address], [place], [place] [address]   MRSA PCR Screening     Status: None   Collection Time: 01/17/20  6:33 AM   Specimen: Nasopharyngeal  Result Value Ref Range Status   MRSA by PCR NEGATIVE NEGATIVE Final    Comment:        The GeneXpert MRSA Assay (FDA approved for NASAL specimens only), is one component of a comprehensive MRSA colonization surveillance program. It is not intended to diagnose MRSA infection nor to guide or monitor treatment for MRSA infections. Performed at [facility], [address], [place], [place] [address]   Respiratory Panel by RT PCR (Flu A&B, Covid) - Nasopharyngeal Swab     Status: None   Collection Time: 01/21/20  9:43 AM   Specimen: Nasopharyngeal Swab  Result Value Ref Range Status   SARS Coronavirus 2 by RT PCR NEGATIVE NEGATIVE Final    Comment: (NOTE) SARS-CoV-2 target nucleic acids are NOT  DETECTED. The SARS-CoV-2 RNA is generally detectable in upper respiratoy specimens during the acute phase of infection. The lowest concentration of SARS-CoV-2 viral copies this assay can detect is 131 copies/mL. A negative result does not preclude SARS-Cov-2 infection and should not be used as the sole basis for treatment or other patient management decisions. A negative result may occur with  improper specimen collection/handling, submission of specimen other than nasopharyngeal swab, presence of viral mutation(s) within the areas targeted by this assay, and inadequate number of viral copies (<131 copies/mL). A negative result must be combined with clinical observations, patient history, and epidemiological information. The expected result is Negative. Fact Sheet for Patients:  PinkCheek.be Fact Sheet for Healthcare Providers:  GravelBags.it This test is not yet ap proved or cleared by the [place] FDA and  has been authorized for detection and/or diagnosis of SARS-CoV-2 by FDA under an Emergency Use Authorization (EUA). This EUA will remain  in effect (meaning this test can be used) for the duration of the COVID-19 declaration under Section  564(b)(1) of the Act, 21 U.S.C. section 360bbb-3(b)(1), unless the authorization is terminated or revoked sooner.    Influenza A by PCR NEGATIVE NEGATIVE Final   Influenza B by PCR NEGATIVE NEGATIVE Final    Comment: (NOTE) The Xpert Xpress SARS-CoV-2/FLU/RSV assay is intended as an aid in  the diagnosis of influenza from Nasopharyngeal swab specimens and  should not be used as a sole basis for treatment. Nasal washings and  aspirates are unacceptable for Xpert Xpress SARS-CoV-2/FLU/RSV  testing. Fact Sheet for Patients: PinkCheek.be Fact Sheet for Healthcare Providers: GravelBags.it This test is not yet approved or cleared  by the [place] FDA and  has been authorized for detection and/or diagnosis of SARS-CoV-2 by  FDA under an Emergency Use Authorization (EUA). This EUA will remain  in effect (meaning this test can be used) for the duration of the  Covid-19 declaration under Section 564(b)(1) of the Act, 21  U.S.C. section 360bbb-3(b)(1), unless the authorization is  terminated or revoked. Performed at [facility], [address] [place], [place] [address]      Scheduled Meds: . aspirin EC  81 mg Oral Q breakfast  . busPIRone  10 mg Oral TID  . enoxaparin (LOVENOX) injection  40 mg Subcutaneous Q24H  . insulin aspart  0-5 Units Subcutaneous QHS  . insulin aspart  0-9 Units Subcutaneous TID WC  . insulin aspart  8 Units Subcutaneous TID WC  . insulin glargine  30 Units Subcutaneous Q2200  . losartan  25 mg Oral Daily  . metoprolol succinate  50 mg Oral Daily  . multivitamin with minerals  1 tablet Oral Daily  . pantoprazole  40 mg Oral BID AC  . rosuvastatin  40 mg Oral Daily  . sertraline  25 mg Oral Daily  . sodium chloride flush  3 mL Intravenous Q12H  . [START ON 01/25/2020] torsemide  40 mg Oral Daily   Continuous Infusions: . sodium chloride      Procedures/Studies: DG CHEST PORT 1 VIEW  Result Date: 01/23/2020 CLINICAL DATA:  CHF exacerbation last night, shortness of chest tightness (now resolved) EXAM: PORTABLE CHEST 1 VIEW COMPARISON:  01/22/2020 FINDINGS: Increased interstitial markings/pulmonary vascular congestion in the bilateral upper lobes. No focal consolidation. No pleural effusion or pneumothorax. The heart is normal in size.  Left subclavian ICD. IMPRESSION: Mild residual increased interstitial markings/pulmonary vascular congestion in the bilateral upper lobes, unchanged. No frank interstitial edema. Electronically Signed   By: Julian Hy M.D.   On: 01/23/2020 10:14   DG CHEST PORT 1 VIEW  Result Date: 01/22/2020 CLINICAL DATA:  Cough and intermittent shortness of  breath EXAM: PORTABLE CHEST 1 VIEW COMPARISON:  01/21/2020 FINDINGS: There is a left chest wall ICD with lead in the right ventricle. Heart size normal. Interval decrease in interstitial opacities identified on the previous exam. Scar noted within the right upper lobe. IMPRESSION: 1. Interval improvement in previously noted pulmonary edema pattern. Electronically Signed   By: Kerby Moors M.D.   On: 01/22/2020 09:37   DG Chest Port 1 View  Result Date: 01/21/2020 CLINICAL DATA:  59 year old male with hypoxia and shortness of breath. EXAM: PORTABLE CHEST 1 VIEW COMPARISON:  Portable chest 01/18/2020 and earlier. FINDINGS: Portable AP upright view at 0624 hours. Stable lung volumes and mediastinal contours. Stable left chest AICD. Stable ventilation since yesterday with residual bilateral interstitial opacity, improved compared to 01/17/2020. Superimposed chronic right upper lobe scarring. No pneumothorax. No pleural effusion is evident. Visualized tracheal air column is  within normal limits. Negative visible bowel gas pattern. No acute osseous abnormality identified. IMPRESSION: Stable chest since yesterday, decreased but not resolved diffuse pulmonary interstitial opacity since 01/17/2020. Electronically Signed   By: Genevie Ann M.D.   On: 01/21/2020 06:45   DG CHEST PORT 1 VIEW  Result Date: 01/18/2020 CLINICAL DATA:  Shortness of breath. EXAM: PORTABLE CHEST 1 VIEW COMPARISON:  01/17/2020 FINDINGS: Interval significant improved lung aeration, likely resolving/resolved pulmonary edema. No pleural effusions. No pulmonary lesions. IMPRESSION: Resolving/resolved pulmonary edema. Electronically Signed   By: Marijo Sanes M.D.   On: 01/18/2020 05:35   DG Chest Port 1 View  Result Date: 01/17/2020 CLINICAL DATA:  Shortness of breath EXAM: PORTABLE CHEST 1 VIEW COMPARISON:  11/19/2019 FINDINGS: Cardiac shadow is mildly enlarged but stable. Defibrillator is again seen. Vascular congestion is noted with changes of  pulmonary edema increased from the prior exam. No sizable effusion is noted. No bony abnormality is seen. IMPRESSION: Increasing pulmonary edema when compare with the prior study. Electronically Signed   By: Inez Catalina M.D.   On: 01/17/2020 02:38    Orson Eva, DO  [facility] Pager [phone]  If 7PM-7AM, please contact night-coverage [email or web address] Password [facility] 01/24/2020, 6:27 PM   LOS: 3 days

## 2020-01-24 NOTE — Progress Notes (Addendum)
Progress Note  Patient Name: Antonio Cobb Date of Encounter: 01/24/2020  Primary Cardiologist: [name], MD   Subjective   Ambulated down the hallway multiple times yesterday evening without any symptoms. No chest pain or palpitations.   Inpatient Medications    Scheduled Meds: . aspirin EC  81 mg Oral Q breakfast  . busPIRone  10 mg Oral TID  . enoxaparin (LOVENOX) injection  40 mg Subcutaneous Q24H  . furosemide  40 mg Intravenous BID  . insulin aspart  0-5 Units Subcutaneous QHS  . insulin aspart  0-9 Units Subcutaneous TID WC  . insulin aspart  8 Units Subcutaneous TID WC  . insulin glargine  30 Units Subcutaneous Q2200  . losartan  25 mg Oral Daily  . metoprolol succinate  50 mg Oral Daily  . multivitamin with minerals  1 tablet Oral Daily  . rosuvastatin  40 mg Oral Daily  . sertraline  25 mg Oral Daily  . sodium chloride flush  3 mL Intravenous Q12H   Continuous Infusions: . sodium chloride     PRN Meds: sodium chloride, acetaminophen, ALPRAZolam, ipratropium-albuterol, sodium chloride flush   Vital Signs    Vitals:   01/23/20 2040 01/23/20 2253 01/24/20 0538 01/24/20 0544  BP: (!) 148/98  116/82   Pulse: 80 92 66   Resp: 20  16   Temp: 97.8 F (36.6 C)  (!) 97.3 F (36.3 C)   TempSrc: Oral  Axillary   SpO2: 100% 100% 100%   Weight:    94.8 kg  Height:        Intake/Output Summary (Last 24 hours) at 01/24/2020 1027 Last data filed at 01/23/2020 1900 Gross per 24 hour  Intake 240 ml  Output 1350 ml  Net -1110 ml    Last 3 Weights 01/24/2020 01/22/2020 01/21/2020  Weight (lbs) 208 lb 15.9 oz 203 lb 0.7 oz 200 lb  Weight (kg) 94.8 kg 92.1 kg 90.719 kg      Telemetry    NSR, HR in 60's to 80's with occasional PVC's.  - Personally Reviewed  ECG    No new tracings.   Physical Exam   General: Well developed, well nourished, male appearing in no acute distress. Head: Normocephalic, atraumatic.  Neck: Supple without bruits, JVD not  elevated. Lungs:  Resp regular and unlabored, CTA without wheezing or rales. Heart: RRR, S1, S2, no S3, S4, or murmur; no rub. Abdomen: Soft, non-tender, non-distended with normoactive bowel sounds. No hepatomegaly. No rebound/guarding. No obvious abdominal masses. Extremities: No clubbing, cyanosis, or lower extremity edema. Distal pedal pulses are 2+ bilaterally. Neuro: Alert and oriented X 3. Moves all extremities spontaneously. Psych: Normal affect.  Labs    Chemistry Recent Labs  Lab 01/21/20 0615 01/21/20 0615 01/22/20 0544 01/23/20 0137 01/24/20 0454  NA 139   < > 142 139 137  K 3.6   < > 4.3 3.7 3.9  CL 101   < > 108 107 104  CO2 23   < > 24 23 26   GLUCOSE 265*   < > 154* 102* 134*  BUN 27*   < > 28* 27* 33*  CREATININE 1.57*   < > 1.36* 1.27* 1.29*  CALCIUM 8.9   < > 9.1 9.0 8.9  PROT 8.1  --   --   --   --   ALBUMIN 4.0  --   --   --   --   AST 27  --   --   --   --  ALT 27  --   --   --   --   ALKPHOS 110  --   --   --   --   BILITOT 0.8  --   --   --   --   GFRNONAA 48*   < > 57* >60 >60  GFRAA 55*   < > >60 >60 >60  ANIONGAP 15   < > 10 9 7    < > = values in this interval not displayed.     Hematology Recent Labs  Lab 01/21/20 0615  WBC 13.0*  RBC 5.42  HGB 15.4  HCT 49.9  MCV 92.1  MCH 28.4  MCHC 30.9  RDW 12.2  PLT 282    Cardiac EnzymesNo results for input(s): TROPONINI in the last 168 hours. No results for input(s): TROPIPOC in the last 168 hours.   BNP Recent Labs  Lab 01/21/20 0615  BNP 300.0*     DDimer No results for input(s): DDIMER in the last 168 hours.   Radiology    DG CHEST PORT 1 VIEW  Result Date: 01/23/2020 CLINICAL DATA:  CHF exacerbation last night, shortness of chest tightness (now resolved) EXAM: PORTABLE CHEST 1 VIEW COMPARISON:  01/22/2020 FINDINGS: Increased interstitial markings/pulmonary vascular congestion in the bilateral upper lobes. No focal consolidation. No pleural effusion or pneumothorax. The heart is  normal in size.  Left subclavian ICD. IMPRESSION: Mild residual increased interstitial markings/pulmonary vascular congestion in the bilateral upper lobes, unchanged. No frank interstitial edema. Electronically Signed   By: Julian Hy M.D.   On: 01/23/2020 10:14    Cardiac Studies   Echocardiogram: 10/2019 IMPRESSIONS    1. Left ventricular ejection fraction, by visual estimation, is 25%. The  left ventricle has severely decreased function. There is mildly increased  left ventricular hypertrophy.  2. Multiple segmental abnormalities exist. See findings.  3. Left ventricular diastolic parameters are indeterminate.  4. Moderately dilated left ventricular internal cavity size.  5. Global right ventricle has normal systolic function.The right  ventricular size is normal. No increase in right ventricular wall  thickness.  6. Left atrial size was normal.  7. Right atrial size was normal.  8. Presence of pericardial fat pad.  9. Mild to moderate aortic valve annular calcification.  10. The mitral valve is grossly normal. Mild mitral valve regurgitation.  11. The tricuspid valve is grossly normal. Tricuspid valve regurgitation  is trivial.  12. The aortic valve is tricuspid. Aortic valve regurgitation is trivial.  13. The pulmonic valve was grossly normal. Pulmonic valve regurgitation is  not visualized.  14. TR signal is inadequate for assessing pulmonary artery systolic  pressure.  15. A pacer wire is visualized.  16. The inferior vena cava is normal in size with greater than 50%  respiratory variability, suggesting right atrial pressure of 3 mmHg.   Patient Profile     59 y.o. male w/ PMH of CAD (s/p STEMI in 2013 with DES to LCx, patent by repeat cath in 05/2016 with moderate residual CAD along RCA and LAD), chronic combined systolic and diastolic CHF (EF [id number] by echo in 09/2016, 25-30% in 08/2017 and 25% by most recent echo in 10/2019, s/p ICD placement in 01/2018),  HTN and HLD currently admitted for hypertensive urgency and an acute CHF exacerbation.   Assessment & Plan    1. Acute on Chronic Systolic CHF - presented with worsening dyspnea and BNP was elevated to 517 and CXR showed pulmonary vascular congestion. He has been started  on IV Lasix 40mg  BID with a recorded net output of -2.1L (-1.3L yesterday) but I&O's have not been fully accurate. Weight listed as having increased by 9 lbs since admission so doubt the accuracy of this. Will ask for a standing weight to be obtained later today. Renal function remains stable with creatinine at 1.29 (was at 1.57 on admission). Would consider transitioning to Torsemide at discharge as he had previously done well with this and was transitioned to PO Lasix 40mg  BID at discharge on 2/4 but was readmitted on 01/21/2020. - intolerant to Entresto in the past. Remains on Toprol-XL 50mg  daily and Losartan has been restarted at 25mg  daily. Device was interrogated yesterday and showed a 6 second run of SVT during the early morning hours of 2/9 with ventricular rate at 150 - 200 bpm.   2. CAD - s/p STEMI in 2013 with DES to LCx, patent by repeat cath in 05/2016 with moderate residual CAD along RCA and LAD.  - HS Troponin values flat at 12 and 31 this admission. He denies any recent chest pain and breathing is now back to baseline.  - continue ASA, BB and statin therapy.   3. Hypertension - admitted with hypertensive urgency but later developed hypotension which led to PTA Losartan and Spironolactone being held. Restarted on Losartan 25mg  daily yesterday and BP stable at 116/82 this AM. Can hopefully add back low-dose Spironolactone at the time of discharge once transitioned to PO diuretics.    4. Nocturnal Oxygen Desaturations - he has been using BiPAP at night this admission. Will need an outpatient sleep study as soon as possible. Reports having one 10+ years ago but no recent studies.    For questions or updates, please  contact [facility] Please consult [email or web address] for contact info under Cardiology/STEMI.   Arna Medici , PA-C 10:27 AM 01/24/2020 Pager: [phone]  Attending note  Patient seen and discussed with PA Ahmed Prima, I agree with her documentation. At least 4 admissions over the last 2 months with flash pulmonary edema in setting of chornic systolic HF. He typically does well at home, compliant with meds and active during the day. His prior admits have come from sudden onset of symptoms in the middle of the night awakening him from sleep with severe SOB. WHen presents to hospital is severely hypoxic and hypertenstive with SBPs in the 200s. During prior admits managed with bipap and diuretics and discharged. Same pattern of events this admission. While inpatient 2 nights ago he started to have another episode of SOB in middle of the night. Sats were low 80s, SBPs to 200s. RT place him on bipap with rapid resolution of symptoms. Last night was placed on bipap at bed time and slept well throught the night. Based on history I suspect sleep apnea is causing exacerbations of his chronis systolic HF, we have consulted pulmonary to see if can expedite evaluation and potential home management given his multiple admissions and the extremis when he does present.   We will change his lasix to oral torsemide, continue other meds. Appears euvolemic today, walked the halls several times yesterday without any troubles.   [name] MD

## 2020-01-25 ENCOUNTER — Telehealth: Payer: Self-pay | Admitting: Internal Medicine

## 2020-01-25 ENCOUNTER — Other Ambulatory Visit: Payer: Self-pay | Admitting: Student

## 2020-01-25 DIAGNOSIS — Z79899 Other long term (current) drug therapy: Secondary | ICD-10-CM

## 2020-01-25 DIAGNOSIS — G4733 Obstructive sleep apnea (adult) (pediatric): Secondary | ICD-10-CM

## 2020-01-25 LAB — GLUCOSE, CAPILLARY
Glucose-Capillary: 119 mg/dL — ABNORMAL HIGH (ref 70–99)
Glucose-Capillary: 124 mg/dL — ABNORMAL HIGH (ref 70–99)
Glucose-Capillary: 138 mg/dL — ABNORMAL HIGH (ref 70–99)

## 2020-01-25 LAB — BASIC METABOLIC PANEL
Anion gap: 8 (ref 5–15)
BUN: 31 mg/dL — ABNORMAL HIGH (ref 6–20)
CO2: 25 mmol/L (ref 22–32)
Calcium: 8.9 mg/dL (ref 8.9–10.3)
Chloride: 105 mmol/L (ref 98–111)
Creatinine, Ser: 1.3 mg/dL — ABNORMAL HIGH (ref 0.61–1.24)
GFR calc Af Amer: >60 mL/min (ref >60)
GFR calc non Af Amer: 60 mL/min — ABNORMAL LOW (ref >60)
Glucose, Bld: 134 mg/dL — ABNORMAL HIGH (ref 70–99)
Potassium: 3.9 mmol/L (ref 3.5–5.1)
Sodium: 138 mmol/L (ref 135–145)

## 2020-01-25 MED ORDER — TORSEMIDE 20 MG PO TABS: 40.0000 mg | ORAL_TABLET | Freq: Every day | ORAL | 1 refills | Status: DC

## 2020-01-25 MED ORDER — FAMOTIDINE 20 MG PO TABS: 20.0000 mg | ORAL_TABLET | Freq: Every day | ORAL | Status: DC

## 2020-01-25 MED ORDER — FAMOTIDINE 20 MG PO TABS: 20.0000 mg | ORAL_TABLET | Freq: Every day | ORAL | Status: AC

## 2020-01-25 MED ORDER — PANTOPRAZOLE SODIUM 40 MG PO TBEC: 40.0000 mg | DELAYED_RELEASE_TABLET | Freq: Two times a day (BID) | ORAL | 1 refills | Status: DC

## 2020-01-25 NOTE — Discharge Summary (Addendum)
Physician Discharge Summary  [facility] [id number] DOB: 1961-11-15 DOA: 01/21/2020  PCP: [name], MD  Admit date: 01/21/2020 Discharge date: 01/25/2020  Admitted From: Home Disposition:  Home   Recommendations for Outpatient Follow-up:  1. Follow up with PCP in 1-2 weeks 2. Please obtain BMP/CBC in one week   Discharge Condition: Stable CODE STATUS: FULL Diet recommendation: Heart Healthy / Carb Modified   Brief/Interim Summary: 58y.o.male,with medical history significant ofalcohol abuse, coronary artery disease status post STEMI with DES to left circumflex in 0000000, systolic heart failure with ejection fraction 25 to 30% and is status post ICD, COPD, type 2 diabetes mellitus poorly controlled and insulin-dependent, essential hypertension, hyperlipidemia, chronic kidney disease a stage IIIawho presented to the ER with worsening shortness of breath,patient with recent hospitalization, was discharged 01/18/2020, secondary to congestive heart failure episode, where he was discharged on Lasix oral, patient presents with shortness of breath, developed overnight, no relief with using his albuterol inhaler, he was found to be hypoxic by EMS saturating 58% on room air, respiratory rate of 40, with elevated blood pressure, [id number] systolic, patient did receive nitroglycerin spray x2, and he was started on CPAP, upon presentation to ED.  During the hospitalization, he continued to have nocturnal oxygen desaturations with elevation of BP similar to his prior hospitalizations and episodes at home.  Pulmonary was consulted.  Dr. Melvyn Novas felt the patient likely had upper airway cough syndrome (UACS) and started patient of protonix.  He did not feel patient had COPD and his incruse and dulera were discontinue.  Dr. Melvyn Novas will try to arrange for home sleep study and follow up in pulmonary clinic and set up auto-set CPAP.  Discharge Diagnoses:  Acute hypoxic/hypercarbic respiratory  failure -Patient saturating 58% on room air at his house, as well VBG showing PCO2 of 64 -on BiPAP in ED -now weaned to RA since admission -This is most likely in the setting of flash pulmonary edema secondary to hypertensive urgency.  Acute exacerbation of chronic congestive heart failure with reduced ejection fraction: -S/p AICD placement -Patient with recent admission 12/3-12/4, similar presentation, likely in setting of flash pulmonary edema secondary to hypertensive urgency, and acute systolic CHF. - Echocardiogram on 11/13/2019 which showed EF of 25%, severely decreased LV function, moderately dilated LV cavity. -Aldactone, metoprolol and losartan held initially given soft blood pressure. -Continue with daily weights, strict ins and outs. -Consult cardiology--appreciated -IV lasix transition to torsemide 01/25/20-->d/c home with torsemide 40 mg daily -Discharge standing weight 200.1 lbs  Nocturnal Oxygen Desaturations - he has been using BiPAP at night this admission. Will need an outpatient sleep studyas soon as possible. Reports having one 10+ years ago but no recent studies. -appreciate pulmonary consult--suspect VCD vs Upper airway cough syndrome-->tx GERD with PPI bid and pepcid 20 mg at hs -trying to arrange auto-CPAP for discharge -d/c incruse and dulera as pt does not likely have COPD  Coronary artery disease status post and placement: -Continue with aspirin -He denies any chest pain, EKG nonacute, will cycle troponins, anticipate it will be elevated secondary to demand ischemia, but will continue to follow the trend.  Hypertension: -Soft blood pressure will hold medications initially -losartan and metoprolol succinate restarted -holding spironolactone due to soft BPs  Hyperlipidemia: Continue Crestor  Insulin-dependent diabetes mellitus type 2, uncontrolled with hyperglycemia -01/22/20 A1C--9.5 -Continue reduced dose Lantus -continue sliding scale -CBGs  well controlled during hospitalization suggesting dietary indiscretion at home  History of alcohol abuse: -Counseled about cessation  chronic kidney  disease stage IIIa: -Monitor renal function closely -baseline creatinine 1.1-1.4  COPD: -No active wheezing   Discharge Instructions   Allergies as of 01/25/2020      Reactions   Entresto [sacubitril-valsartan]    Dizziness, Coughing   Other    Seasonal allergies  - has to use inhaler      Medication List    STOP taking these medications   furosemide 40 MG tablet Commonly known as: Lasix   Incruse Ellipta 62.5 MCG/INH Aepb Generic drug: umeclidinium bromide   mometasone-formoterol 100-5 MCG/ACT Aero Commonly known as: DULERA   spironolactone 25 MG tablet Commonly known as: ALDACTONE     TAKE these medications   albuterol 108 (90 Base) MCG/ACT inhaler Commonly known as: VENTOLIN HFA Inhale 2 puffs into the lungs every 6 (six) hours as needed for wheezing or shortness of breath.   aspirin 81 MG EC tablet Take 1 tablet (81 mg total) by mouth daily with breakfast.   busPIRone 10 MG tablet Commonly known as: BUSPAR Take 1 tablet (10 mg total) by mouth 3 (three) times daily. For anxiety   famotidine 20 MG tablet Commonly known as: PEPCID Take 1 tablet (20 mg total) by mouth at bedtime.   FREESTYLE LITE test strip Generic drug: glucose blood TEST FOUR TIMES DAILY   hydrOXYzine 25 MG tablet Commonly known as: ATARAX/VISTARIL Take one tablet by mouth at bedtime, for anxiety and sleep What changed:   how much to take  how to take this  when to take this  reasons to take this   insulin regular 100 units/mL injection Commonly known as: NOVOLIN R Inject 0.1 mLs (10 Units total) into the skin 3 (three) times daily before meals.   ipratropium-albuterol 0.5-2.5 (3) MG/3ML Soln Commonly known as: DUONEB Take 3 mLs by nebulization every 6 (six) hours as needed.   Lantus SoloStar 100 UNIT/ML Solostar  Pen Generic drug: Insulin Glargine ADMINISTER 50 UNITS UNDER THE SKIN EVERY NIGHT AT BEDTIME What changed: See the new instructions.   losartan 25 MG tablet Commonly known as: COZAAR Take 1 tablet (25 mg total) by mouth daily.   metoprolol succinate 50 MG 24 hr tablet Commonly known as: TOPROL-XL TAKE 1 TABLET BY MOUTH EVERY DAY WITH FOOD What changed:   how much to take  how to take this  when to take this   multivitamin with minerals tablet Take 1 tablet by mouth daily.   nitroGLYCERIN 0.4 MG SL tablet Commonly known as: Nitrostat Place 1 tablet (0.4 mg total) under the tongue every 5 (five) minutes as needed for chest pain.   pantoprazole 40 MG tablet Commonly known as: PROTONIX Take 1 tablet (40 mg total) by mouth 2 (two) times daily before a meal.   potassium chloride SA 20 MEQ tablet Commonly known as: KLOR-CON Take 1 tablet (20 mEq total) by mouth daily.   rosuvastatin 40 MG tablet Commonly known as: CRESTOR Take 1 tablet (40 mg total) by mouth daily.   sertraline 25 MG tablet Commonly known as: Zoloft Take 1 tablet (25 mg total) by mouth daily. For anxiety/panic attacks   torsemide 20 MG tablet Commonly known as: DEMADEX Take 2 tablets (40 mg total) by mouth daily. Start taking on: January 26, 2020      Follow-up Information    [facility], [place] Follow up on 02/05/2020.   Specialties: Physician Assistant, Cardiology Why: Dr. Harl Bowie wants you to follow-up in 2 weeks. Appointment arranged for 02/05/2020 at 2:00. If needing a  different date or time, please contact the office. Please have labs drawn at [facility] next Thurday or Friday or by your PCP. Do not need to fast.  Contact information: [address]  [place] [address] [phone]          Allergies  Allergen Reactions  . Entresto [Sacubitril-Valsartan]     Dizziness, Coughing  . Other     Seasonal allergies  - has to use inhaler    Consultations:  Cardiology  Pulmonary  medicine   Procedures/Studies: DG CHEST PORT 1 VIEW  Result Date: 01/23/2020 CLINICAL DATA:  CHF exacerbation last night, shortness of chest tightness (now resolved) EXAM: PORTABLE CHEST 1 VIEW COMPARISON:  01/22/2020 FINDINGS: Increased interstitial markings/pulmonary vascular congestion in the bilateral upper lobes. No focal consolidation. No pleural effusion or pneumothorax. The heart is normal in size.  Left subclavian ICD. IMPRESSION: Mild residual increased interstitial markings/pulmonary vascular congestion in the bilateral upper lobes, unchanged. No frank interstitial edema. Electronically Signed   By: Julian Hy M.D.   On: 01/23/2020 10:14   DG CHEST PORT 1 VIEW  Result Date: 01/22/2020 CLINICAL DATA:  Cough and intermittent shortness of breath EXAM: PORTABLE CHEST 1 VIEW COMPARISON:  01/21/2020 FINDINGS: There is a left chest wall ICD with lead in the right ventricle. Heart size normal. Interval decrease in interstitial opacities identified on the previous exam. Scar noted within the right upper lobe. IMPRESSION: 1. Interval improvement in previously noted pulmonary edema pattern. Electronically Signed   By: Kerby Moors M.D.   On: 01/22/2020 09:37   DG Chest Port 1 View  Result Date: 01/21/2020 CLINICAL DATA:  59 year old male with hypoxia and shortness of breath. EXAM: PORTABLE CHEST 1 VIEW COMPARISON:  Portable chest 01/18/2020 and earlier. FINDINGS: Portable AP upright view at 0624 hours. Stable lung volumes and mediastinal contours. Stable left chest AICD. Stable ventilation since yesterday with residual bilateral interstitial opacity, improved compared to 01/17/2020. Superimposed chronic right upper lobe scarring. No pneumothorax. No pleural effusion is evident. Visualized tracheal air column is within normal limits. Negative visible bowel gas pattern. No acute osseous abnormality identified. IMPRESSION: Stable chest since yesterday, decreased but not resolved diffuse pulmonary  interstitial opacity since 01/17/2020. Electronically Signed   By: Genevie Ann M.D.   On: 01/21/2020 06:45   DG CHEST PORT 1 VIEW  Result Date: 01/18/2020 CLINICAL DATA:  Shortness of breath. EXAM: PORTABLE CHEST 1 VIEW COMPARISON:  01/17/2020 FINDINGS: Interval significant improved lung aeration, likely resolving/resolved pulmonary edema. No pleural effusions. No pulmonary lesions. IMPRESSION: Resolving/resolved pulmonary edema. Electronically Signed   By: Marijo Sanes M.D.   On: 01/18/2020 05:35   DG Chest Port 1 View  Result Date: 01/17/2020 CLINICAL DATA:  Shortness of breath EXAM: PORTABLE CHEST 1 VIEW COMPARISON:  11/19/2019 FINDINGS: Cardiac shadow is mildly enlarged but stable. Defibrillator is again seen. Vascular congestion is noted with changes of pulmonary edema increased from the prior exam. No sizable effusion is noted. No bony abnormality is seen. IMPRESSION: Increasing pulmonary edema when compare with the prior study. Electronically Signed   By: Inez Catalina M.D.   On: 01/17/2020 02:38        Discharge Exam: Vitals:   01/24/20 2239 01/25/20 0516  BP:  (!) 104/92  Pulse:  67  Resp:  20  Temp:  (!) 97.5 F (36.4 C)  SpO2: 100% 100%   Vitals:   01/24/20 2148 01/24/20 2239 01/25/20 0516 01/25/20 0900  BP: 118/81  (!) 104/92   Pulse:  71  67   Resp: 18  20   Temp: 98 F (36.7 C)  (!) 97.5 F (36.4 C)   TempSrc: Oral  Oral   SpO2: 100% 100% 100%   Weight:   94.8 kg 90.8 kg  Height:        General: Pt is alert, awake, not in acute distress Cardiovascular: RRR, S1/S2 +, no rubs, no gallops Respiratory: CTA bilaterally, no wheezing, no rhonchi Abdominal: Soft, NT, ND, bowel sounds + Extremities: no edema, no cyanosis   The results of significant diagnostics from this hospitalization (including imaging, microbiology, ancillary and laboratory) are listed below for reference.    Significant Diagnostic Studies: DG CHEST PORT 1 VIEW  Result Date: 01/23/2020 CLINICAL  DATA:  CHF exacerbation last night, shortness of chest tightness (now resolved) EXAM: PORTABLE CHEST 1 VIEW COMPARISON:  01/22/2020 FINDINGS: Increased interstitial markings/pulmonary vascular congestion in the bilateral upper lobes. No focal consolidation. No pleural effusion or pneumothorax. The heart is normal in size.  Left subclavian ICD. IMPRESSION: Mild residual increased interstitial markings/pulmonary vascular congestion in the bilateral upper lobes, unchanged. No frank interstitial edema. Electronically Signed   By: Julian Hy M.D.   On: 01/23/2020 10:14   DG CHEST PORT 1 VIEW  Result Date: 01/22/2020 CLINICAL DATA:  Cough and intermittent shortness of breath EXAM: PORTABLE CHEST 1 VIEW COMPARISON:  01/21/2020 FINDINGS: There is a left chest wall ICD with lead in the right ventricle. Heart size normal. Interval decrease in interstitial opacities identified on the previous exam. Scar noted within the right upper lobe. IMPRESSION: 1. Interval improvement in previously noted pulmonary edema pattern. Electronically Signed   By: Kerby Moors M.D.   On: 01/22/2020 09:37   DG Chest Port 1 View  Result Date: 01/21/2020 CLINICAL DATA:  59 year old male with hypoxia and shortness of breath. EXAM: PORTABLE CHEST 1 VIEW COMPARISON:  Portable chest 01/18/2020 and earlier. FINDINGS: Portable AP upright view at 0624 hours. Stable lung volumes and mediastinal contours. Stable left chest AICD. Stable ventilation since yesterday with residual bilateral interstitial opacity, improved compared to 01/17/2020. Superimposed chronic right upper lobe scarring. No pneumothorax. No pleural effusion is evident. Visualized tracheal air column is within normal limits. Negative visible bowel gas pattern. No acute osseous abnormality identified. IMPRESSION: Stable chest since yesterday, decreased but not resolved diffuse pulmonary interstitial opacity since 01/17/2020. Electronically Signed   By: Genevie Ann M.D.   On:  01/21/2020 06:45   DG CHEST PORT 1 VIEW  Result Date: 01/18/2020 CLINICAL DATA:  Shortness of breath. EXAM: PORTABLE CHEST 1 VIEW COMPARISON:  01/17/2020 FINDINGS: Interval significant improved lung aeration, likely resolving/resolved pulmonary edema. No pleural effusions. No pulmonary lesions. IMPRESSION: Resolving/resolved pulmonary edema. Electronically Signed   By: Marijo Sanes M.D.   On: 01/18/2020 05:35   DG Chest Port 1 View  Result Date: 01/17/2020 CLINICAL DATA:  Shortness of breath EXAM: PORTABLE CHEST 1 VIEW COMPARISON:  11/19/2019 FINDINGS: Cardiac shadow is mildly enlarged but stable. Defibrillator is again seen. Vascular congestion is noted with changes of pulmonary edema increased from the prior exam. No sizable effusion is noted. No bony abnormality is seen. IMPRESSION: Increasing pulmonary edema when compare with the prior study. Electronically Signed   By: Inez Catalina M.D.   On: 01/17/2020 02:38     Microbiology: Recent Results (from the past 240 hour(s))  Respiratory Panel by RT PCR (Flu A&B, Covid) - Nasopharyngeal Swab     Status: None   Collection Time: 01/17/20  3:48  AM   Specimen: Nasopharyngeal Swab  Result Value Ref Range Status   SARS Coronavirus 2 by RT PCR NEGATIVE NEGATIVE Final    Comment: (NOTE) SARS-CoV-2 target nucleic acids are NOT DETECTED. The SARS-CoV-2 RNA is generally detectable in upper respiratoy specimens during the acute phase of infection. The lowest concentration of SARS-CoV-2 viral copies this assay can detect is 131 copies/mL. A negative result does not preclude SARS-Cov-2 infection and should not be used as the sole basis for treatment or other patient management decisions. A negative result may occur with  improper specimen collection/handling, submission of specimen other than nasopharyngeal swab, presence of viral mutation(s) within the areas targeted by this assay, and inadequate number of viral copies (<131 copies/mL). A negative  result must be combined with clinical observations, patient history, and epidemiological information. The expected result is Negative. Fact Sheet for Patients:  PinkCheek.be Fact Sheet for Healthcare Providers:  GravelBags.it This test is not yet ap proved or cleared by the [place] FDA and  has been authorized for detection and/or diagnosis of SARS-CoV-2 by FDA under an Emergency Use Authorization (EUA). This EUA will remain  in effect (meaning this test can be used) for the duration of the COVID-19 declaration under Section 564(b)(1) of the Act, 21 U.S.C. section 360bbb-3(b)(1), unless the authorization is terminated or revoked sooner.    Influenza A by PCR NEGATIVE NEGATIVE Final   Influenza B by PCR NEGATIVE NEGATIVE Final    Comment: (NOTE) The Xpert Xpress SARS-CoV-2/FLU/RSV assay is intended as an aid in  the diagnosis of influenza from Nasopharyngeal swab specimens and  should not be used as a sole basis for treatment. Nasal washings and  aspirates are unacceptable for Xpert Xpress SARS-CoV-2/FLU/RSV  testing. Fact Sheet for Patients: PinkCheek.be Fact Sheet for Healthcare Providers: GravelBags.it This test is not yet approved or cleared by the [place] FDA and  has been authorized for detection and/or diagnosis of SARS-CoV-2 by  FDA under an Emergency Use Authorization (EUA). This EUA will remain  in effect (meaning this test can be used) for the duration of the  Covid-19 declaration under Section 564(b)(1) of the Act, 21  U.S.C. section 360bbb-3(b)(1), unless the authorization is  terminated or revoked. Performed at [facility], [address], [place], [place] [address]   MRSA PCR Screening     Status: None   Collection Time: 01/17/20  6:33 AM   Specimen: Nasopharyngeal  Result Value Ref Range Status   MRSA by PCR NEGATIVE NEGATIVE Final     Comment:        The GeneXpert MRSA Assay (FDA approved for NASAL specimens only), is one component of a comprehensive MRSA colonization surveillance program. It is not intended to diagnose MRSA infection nor to guide or monitor treatment for MRSA infections. Performed at [facility], [address] [place], [place] [address]   Respiratory Panel by RT PCR (Flu A&B, Covid) - Nasopharyngeal Swab     Status: None   Collection Time: 01/21/20  9:43 AM   Specimen: Nasopharyngeal Swab  Result Value Ref Range Status   SARS Coronavirus 2 by RT PCR NEGATIVE NEGATIVE Final    Comment: (NOTE) SARS-CoV-2 target nucleic acids are NOT DETECTED. The SARS-CoV-2 RNA is generally detectable in upper respiratoy specimens during the acute phase of infection. The lowest concentration of SARS-CoV-2 viral copies this assay can detect is 131 copies/mL. A negative result does not preclude SARS-Cov-2 infection and should not be used as the sole basis for treatment  or other patient management decisions. A negative result may occur with  improper specimen collection/handling, submission of specimen other than nasopharyngeal swab, presence of viral mutation(s) within the areas targeted by this assay, and inadequate number of viral copies (<131 copies/mL). A negative result must be combined with clinical observations, patient history, and epidemiological information. The expected result is Negative. Fact Sheet for Patients:  PinkCheek.be Fact Sheet for Healthcare Providers:  GravelBags.it This test is not yet ap proved or cleared by the [place] FDA and  has been authorized for detection and/or diagnosis of SARS-CoV-2 by FDA under an Emergency Use Authorization (EUA). This EUA will remain  in effect (meaning this test can be used) for the duration of the COVID-19 declaration under Section 564(b)(1) of the Act, 21 U.S.C. section 360bbb-3(b)(1),  unless the authorization is terminated or revoked sooner.    Influenza A by PCR NEGATIVE NEGATIVE Final   Influenza B by PCR NEGATIVE NEGATIVE Final    Comment: (NOTE) The Xpert Xpress SARS-CoV-2/FLU/RSV assay is intended as an aid in  the diagnosis of influenza from Nasopharyngeal swab specimens and  should not be used as a sole basis for treatment. Nasal washings and  aspirates are unacceptable for Xpert Xpress SARS-CoV-2/FLU/RSV  testing. Fact Sheet for Patients: PinkCheek.be Fact Sheet for Healthcare Providers: GravelBags.it This test is not yet approved or cleared by the [place] FDA and  has been authorized for detection and/or diagnosis of SARS-CoV-2 by  FDA under an Emergency Use Authorization (EUA). This EUA will remain  in effect (meaning this test can be used) for the duration of the  Covid-19 declaration under Section 564(b)(1) of the Act, 21  U.S.C. section 360bbb-3(b)(1), unless the authorization is  terminated or revoked. Performed at [facility], [address] [place], [place] [address]      Labs: Basic Metabolic Panel: Recent Labs  Lab 01/21/20 0615 01/21/20 0615 01/22/20 0544 01/22/20 0544 01/23/20 0137 01/23/20 0137 01/24/20 0454 01/25/20 0514  NA 139  --  142  --  139  --  137 138  K 3.6   < > 4.3   < > 3.7   < > 3.9 3.9  CL 101  --  108  --  107  --  104 105  CO2 23  --  24  --  23  --  26 25  GLUCOSE 265*  --  154*  --  102*  --  134* 134*  BUN 27*  --  28*  --  27*  --  33* 31*  CREATININE 1.57*  --  1.36*  --  1.27*  --  1.29* 1.30*  CALCIUM 8.9  --  9.1  --  9.0  --  8.9 8.9  MG 2.4  --   --   --  2.2  --   --   --    < > = values in this interval not displayed.   Liver Function Tests: Recent Labs  Lab 01/21/20 0615  AST 27  ALT 27  ALKPHOS 110  BILITOT 0.8  PROT 8.1  ALBUMIN 4.0   No results for input(s): LIPASE, AMYLASE in the last 168 hours. No results for input(s):  AMMONIA in the last 168 hours. CBC: Recent Labs  Lab 01/21/20 0615  WBC 13.0*  NEUTROABS 5.4  HGB 15.4  HCT 49.9  MCV 92.1  PLT 282   Cardiac Enzymes: No results for input(s): CKTOTAL, CKMB, CKMBINDEX, TROPONINI in the last 168 hours. BNP: Invalid input(s):  POCBNP CBG: Recent Labs  Lab 01/24/20 1148 01/24/20 1624 01/24/20 2151 01/25/20 0305 01/25/20 0748  GLUCAP 150* 124* 160* 119* 124*    Time coordinating discharge:  36 minutes  Signed:  Orson Eva, DO [facility] Pager: [phone] 01/25/2020, 10:34 AM

## 2020-01-25 NOTE — Telephone Encounter (Signed)
Called and spoke with pt in regards to the message received by MW. I have scheduled pt a HFU appt with TP 3/18 at 4:30 and told pt to bring all meds with him to that appt. Pt has also been scheduled for a sleep consult with Dr. Halford Chessman 3/10 at 9:30. Order has also been placed for the HST to be performed off of pt's CPAP. Stated to pt that all his appt info should be avail on his AVS once he is able to be discharged from hospital and pt verbalized understanding. Nothing further needed.

## 2020-01-25 NOTE — Telephone Encounter (Signed)
Going home today from [facility] needs  1) NP ov w/in one week with all meds in hand  2) home sleep study off cpap   3) Sood new pt eval for osa next availablle    Christinia Gully, MD Pulmonary and [place] [phone] After 5:30 PM or weekends, use Beeper [phone]

## 2020-01-25 NOTE — Progress Notes (Signed)
RT asked to have patient to perform a NIF to help qualify for home CPAP/BIPAP. RT instructed patient on the test to be performed and patient did over a -60 NIF on 2 attempts.

## 2020-01-25 NOTE — Progress Notes (Signed)
IV removed, tele removed. D/C instructions reviewed with patient and wife, verbalized understanding. To be transported to private vehicle via wheelchair.

## 2020-01-25 NOTE — Progress Notes (Addendum)
Progress Note  Patient Name: Antonio Cobb Date of Encounter: 01/25/2020  Primary Cardiologist: [name], MD   Subjective   Breathing at baseline. He has been ambulating down the hallway without difficulty. No orthopnea or PND overnight. He denies any chest pain or palpitations.   Inpatient Medications    Scheduled Meds: . aspirin EC  81 mg Oral Q breakfast  . busPIRone  10 mg Oral TID  . enoxaparin (LOVENOX) injection  40 mg Subcutaneous Q24H  . insulin aspart  0-5 Units Subcutaneous QHS  . insulin aspart  0-9 Units Subcutaneous TID WC  . insulin aspart  8 Units Subcutaneous TID WC  . insulin glargine  30 Units Subcutaneous Q2200  . losartan  25 mg Oral Daily  . metoprolol succinate  50 mg Oral Daily  . multivitamin with minerals  1 tablet Oral Daily  . pantoprazole  40 mg Oral BID AC  . rosuvastatin  40 mg Oral Daily  . sertraline  25 mg Oral Daily  . sodium chloride flush  3 mL Intravenous Q12H  . torsemide  40 mg Oral Daily   Continuous Infusions: . sodium chloride     PRN Meds: sodium chloride, acetaminophen, ALPRAZolam, ipratropium-albuterol, sodium chloride flush   Vital Signs    Vitals:   01/24/20 2033 01/24/20 2148 01/24/20 2239 01/25/20 0516  BP:  118/81  (!) 104/92  Pulse:  71  67  Resp:  18  20  Temp:  98 F (36.7 C)  (!) 97.5 F (36.4 C)  TempSrc:  Oral  Oral  SpO2: 95% 100% 100% 100%  Weight:    94.8 kg  Height:        Intake/Output Summary (Last 24 hours) at 01/25/2020 0841 Last data filed at 01/25/2020 0300 Gross per 24 hour  Intake 720 ml  Output 1825 ml  Net -1105 ml    Last 3 Weights 01/25/2020 01/24/2020 01/22/2020  Weight (lbs) 208 lb 15.9 oz 208 lb 15.9 oz 203 lb 0.7 oz  Weight (kg) 94.8 kg 94.8 kg 92.1 kg      Telemetry    NSR, HR in 50's to 60's. No significant arrhythmias.  - Personally Reviewed  ECG    No new tracings.   Physical Exam   General: Well developed, well nourished, male appearing in no acute  distress. Head: Normocephalic, atraumatic.  Neck: Supple without bruits, JVD not elevated. Lungs:  Resp regular and unlabored, CTA without wheezing or rales. Heart: RRR, S1, S2, no S3, S4, or murmur; no rub. Abdomen: Soft, non-tender, non-distended with normoactive bowel sounds. No hepatomegaly. No rebound/guarding. No obvious abdominal masses. Extremities: No clubbing, cyanosis, or lower extremity edema. Distal pedal pulses are 2+ bilaterally. Neuro: Alert and oriented X 3. Moves all extremities spontaneously. Psych: Normal affect.  Labs    Chemistry Recent Labs  Lab 01/21/20 0615 01/22/20 0544 01/23/20 0137 01/24/20 0454 01/25/20 0514  NA 139   < > 139 137 138  K 3.6   < > 3.7 3.9 3.9  CL 101   < > 107 104 105  CO2 23   < > 23 26 25   GLUCOSE 265*   < > 102* 134* 134*  BUN 27*   < > 27* 33* 31*  CREATININE 1.57*   < > 1.27* 1.29* 1.30*  CALCIUM 8.9   < > 9.0 8.9 8.9  PROT 8.1  --   --   --   --   ALBUMIN 4.0  --   --   --   --  AST 27  --   --   --   --   ALT 27  --   --   --   --   ALKPHOS 110  --   --   --   --   BILITOT 0.8  --   --   --   --   GFRNONAA 48*   < > >60 >60 60*  GFRAA 55*   < > >60 >60 >60  ANIONGAP 15   < > 9 7 8    < > = values in this interval not displayed.     Hematology Recent Labs  Lab 01/21/20 0615  WBC 13.0*  RBC 5.42  HGB 15.4  HCT 49.9  MCV 92.1  MCH 28.4  MCHC 30.9  RDW 12.2  PLT 282    Cardiac EnzymesNo results for input(s): TROPONINI in the last 168 hours. No results for input(s): TROPIPOC in the last 168 hours.   BNP Recent Labs  Lab 01/21/20 0615  BNP 300.0*     DDimer No results for input(s): DDIMER in the last 168 hours.   Radiology    DG CHEST PORT 1 VIEW  Result Date: 01/23/2020 CLINICAL DATA:  CHF exacerbation last night, shortness of chest tightness (now resolved) EXAM: PORTABLE CHEST 1 VIEW COMPARISON:  01/22/2020 FINDINGS: Increased interstitial markings/pulmonary vascular congestion in the bilateral upper  lobes. No focal consolidation. No pleural effusion or pneumothorax. The heart is normal in size.  Left subclavian ICD. IMPRESSION: Mild residual increased interstitial markings/pulmonary vascular congestion in the bilateral upper lobes, unchanged. No frank interstitial edema. Electronically Signed   By: Julian Hy M.D.   On: 01/23/2020 10:14    Cardiac Studies    Cardiac Catheterization: 05/2016  Patent stent in the mid circumflex.  Moderate disease in the RCA and LAD.  Mild pulmonary hypertension.  Cardiac output 5.1 L/m. Cardiac index 2.4  Pulmonary artery saturation 66%.   Cardiomyopathy not related to obstructive coronary artery disease. The patient did state that he drinks alcohol heavily. I did explain him the cutting back on his alcohol may help his cardiac function. Follow-up with Dr. Harl Bowie.   Echocardiogram: 11/13/2019 IMPRESSIONS    1. Left ventricular ejection fraction, by visual estimation, is 25%. The  left ventricle has severely decreased function. There is mildly increased  left ventricular hypertrophy.  2. Multiple segmental abnormalities exist. See findings.  3. Left ventricular diastolic parameters are indeterminate.  4. Moderately dilated left ventricular internal cavity size.  5. Global right ventricle has normal systolic function.The right  ventricular size is normal. No increase in right ventricular wall  thickness.  6. Left atrial size was normal.  7. Right atrial size was normal.  8. Presence of pericardial fat pad.  9. Mild to moderate aortic valve annular calcification.  10. The mitral valve is grossly normal. Mild mitral valve regurgitation.  11. The tricuspid valve is grossly normal. Tricuspid valve regurgitation  is trivial.  12. The aortic valve is tricuspid. Aortic valve regurgitation is trivial.  13. The pulmonic valve was grossly normal. Pulmonic valve regurgitation is  not visualized.  14. TR signal is inadequate for  assessing pulmonary artery systolic  pressure.  15. A pacer wire is visualized.  16. The inferior vena cava is normal in size with greater than 50%  respiratory variability, suggesting right atrial pressure of 3 mmHg.   Patient Profile     59 y.o. male w/ PMH of CAD(s/p STEMI in 2013 with DES to LCx,  patent by repeat cath in 05/2016 with moderate residual CAD along RCA and LAD), chronic combined systolic and diastolic CHF (EF [id number] by echo in 09/2016, 25-30% in 08/2017 and 25% by most recent echo in 10/2019, s/p ICD placement in 01/2018), HTN and HLDcurrently admitted for hypertensive urgency and an acute CHF exacerbation  Assessment & Plan    1. Acute on Chronic Systolic CHF - presented with worsening dyspnea and BNP was elevated to 517 and CXR showed pulmonary vascular congestion. He has been started on IV Lasix 40mg  BID and transitioned to Torsemide 40mg  daily with first dose to be administered this AM. Recorded output of -3.1L. Bed weight this AM still listed as 209 lbs. Standing weight obtained at the time of this encounter and at 200.1 lbs. He reports a baseline weight in the mid-190's (weight was 201 lbs at the time of his office visit in 11/2019 so suspect this is most consistent with his dry weight).  - intolerant to Entresto in the past and he has been restarted on PTA Toprol-XL 50mg  daily and Losartan 25mg  daily. BP does not allow for Spironolactone to be added back at this time. - spent time this morning reviewing a low-sodium diet as he was previously consuming [employer] regularly prior to admission.   2. CAD - s/p STEMI in 2013 with DES to LCx, patent by repeat cath in 05/2016 with moderate residual CAD along RCA and LAD as outlined above. - he denies any recent chest pain and dyspnea has returned to baseline following diuresis. He has ambulated down the hallway multiple times without anginal symptoms.  - continue ASA 81mg  daily, Toprol-XL 50mg  daily and Crestor 40mg  daily.    3. Hypertension - admitted with hypertensive urgency but then developed hypotension. BP stable at 104/70 - 118/92 within the past 24 hours.  He has been restarted on Toprol-XL 50mg  daily and Losartan 25mg  daily. Would consider restarting Spironolactone as an outpatient if BP allows.   4. Nocturnal Oxygen Desaturations - he has been using BiPAP at night this admission due to desaturations several nights ago.  - Pulmonology following and arranging for outpatient sleep study.   5. Stage 3 CKD - baseline creatinine 1.2 - 1.3. Elevated to 1.83 on admission, stable at 1.30 today. Will need a repeat BMET in 7-10 days.   For questions or updates, please contact [place] Please consult [email or web address] for contact info under Cardiology/STEMI.   Arna Medici , PA-C 8:41 AM 01/25/2020 Pager: [phone]  Attending note  Patient seen and discussed with PA Ahmed Prima, I agree with her documentation. REcurrent episodes over the last few months of flash pulmonary edema and severe HTN always occurring in the middle of the night. This admission bp's are controlled, diuresed and he has been changed to oral diuretics starting today. Concern for sleep apnea exacerbating his chronic systolic HF, seen by pulmonary. He is to have outpatient sleep study arranged.   Crisp for discharge from cardiac standpoint, we will arrange outpatient f/u and labs. We will signoff inpatient care   [name] MD

## 2020-01-25 NOTE — Progress Notes (Signed)
Discussed d/c planning with Dr Carles Collet and Halford Chessman:  1) OSA a probable, not documented, dx so insurance company may not pay for initial set up  2) d/c on  cpap autoset, no 02   3) I'll have my office get him set up asap with NP/home sleep study to verify he has the dz and set up with Dr Halford Chessman for longterm f/u initially in [place] but eventually in [place]' old office   4) In meantime:  Pantoprazole (protonix) 40 mg   Take  30-60 min before first meal of the day and Pepcid (famotidine)  20 mg one @  bedtime until return to office - this is the best way to tell whether stomach acid is contributing to your problem.    5) GERD (REFLUX)  is an extremely common cause of respiratory symptoms just like yours , many times with no obvious heartburn at all.    It can be treated with medication, but also with lifestyle changes including elevation of the head of your bed (ideally with 6 -8inch blocks under the headboard of your bed),  Smoking cessation, avoidance of late meals, excessive alcohol, and avoid fatty foods, chocolate, peppermint, colas, red wine, and acidic juices such as orange juice.  NO MINT OR MENTHOL PRODUCTS SO NO COUGH DROPS  USE SUGARLESS CANDY INSTEAD (Jolley ranchers or Stover's or Life Savers) or even ice chips will also do - the key is to swallow to prevent all throat clearing. NO OIL BASED VITAMINS - use powdered substitutes.  Avoid fish oil when coughing.     Ok for d/c but make sure he undersatands only to take meds as listed and not on an ACEi and when comes to office  Bring  all meds in hand using a trust but verify approach to confirm accurate Medication  Reconciliation The principal here is that until we are certain that the  patients are doing what we've asked, it makes no sense to ask them to do more.    Christinia Gully, MD Pulmonary and [place] [phone] After 5:30 PM or weekends, use [employer] [phone]

## 2020-01-26 ENCOUNTER — Telehealth: Payer: Self-pay

## 2020-01-26 ENCOUNTER — Telehealth: Payer: Self-pay | Admitting: Internal Medicine

## 2020-01-26 MED ORDER — PANTOPRAZOLE SODIUM 40 MG PO TBEC: 40.0000 mg | DELAYED_RELEASE_TABLET | Freq: Every day | ORAL | 1 refills | Status: DC

## 2020-01-26 NOTE — Telephone Encounter (Signed)
Rx for pantoprazole with instructions to take once daily has been sent to pharmacy for pt. Called and spoke with pt letting him know this had been done. Stated to pt that I would call pharmacy to let them know of Rx changes and he verbalized understanding.  Called pt's pharmacy and spoke with Helyn App, pharmacist letting her know the above info and she verbalized understanding and stated she closed the original Rx that had been sent in by Dr. Carles Collet for pt to take it twice daily. Nothing further needed.

## 2020-01-26 NOTE — Telephone Encounter (Signed)
Yes can change to Pantoprazole (protonix) 40 mg   Take  30-60 min before first meal of the day and Pepcid (famotidine)  20 mg one @  bedtime until return to office - this is the best way to tell whether stomach acid is contributing to your problem.

## 2020-01-26 NOTE — Telephone Encounter (Signed)
LMTCB, Patient needs to speak with Dr.Simpson regearding medication for sleep.

## 2020-01-26 NOTE — Telephone Encounter (Signed)
Patient called back, will call Dr.Simpson

## 2020-01-26 NOTE — Telephone Encounter (Signed)
01-26-20/Rec'd call from Pt asking for meds to help sleep.  Please call [phone]  thanks,renee

## 2020-01-26 NOTE — Telephone Encounter (Signed)
Called and spoke with pt. Pt stated when he went to pick up the pantoprazole, he was told that the med was requiring a PA. Looking into this, the pantoprazole was prescribed for pt to take it bid and usually when this med is prescribed this way is when insurance requires a PA.  Pt was also prescribed pepcid to take that at bedtime. Since pt was prescribed pepcid as well as the pantoprazole, Dr. Melvyn Novas, please advise if you are okay with [place] switching the pantoprazole to pt taking it once a day instead of twice daily?

## 2020-02-01 ENCOUNTER — Inpatient Hospital Stay: Payer: BC Managed Care – PPO | Admitting: Adult Health

## 2020-02-02 NOTE — Progress Notes (Deleted)
Cardiology Office Note    Date:  02/02/2020   ID:  ISMAILA PELSTER, DOB 05/01/1961, MRN [id number]  PCP:  [name], MD  Cardiologist: [name], MD    No chief complaint on file.   History of Present Illness:    Antonio Cobb is a 59 y.o. male with past medical history of CAD(s/p STEMI in 2013 with DES to LCx, patent by repeat cath in 05/2016 with moderate residual CAD along RCA and LAD), chronic combined systolic and diastolic CHF (EF [id number] by echo in 09/2016, 25-30% in 08/2017 and 25% by most recent echo in 10/2019, s/p ICD placement in 01/2018), HTN, HLD, Type 2 DM and Stage 3 CKD who presents to the office today for hospital follow-up.  He was admitted earlier this month for an acute CHF exacerbation and Torsemide was transitioned to Lasix at the time of discharge. On 01/22/2020, he presented back to the ED for worsening dyspnea which had occurred during the night. He was found to have acute hypoxic respiratory failure in the setting of flash pulmonary edema and hypertensive urgency. He was started on IV Lasix but continued to have episodes of nocturnal desaturations.This improved after being placed on BiPAP and Pulmonology was consulted to help with arranging an expedited outpatient sleep study. Weight had declined to 201 lbs at the time of discharge and he was transitioned to Torsemide 40mg  daily.  He was also restarted on Toprol-XL 50 mg daily and Losartan 25 mg daily (previously intolerant to [place]).  Spironolactone was held at discharge given soft BP.   - BMET  Past Medical History:  Diagnosis Date  . Alcohol abuse   . CAD (coronary artery disease)    a. s/p inferolateral STEMI in 05/2012 with DES to LCx b. patent stent by cath in 05/2016 with moderate RCA and LAD disease  . Cardiomyopathy ([place])    a. LVEF 30-35% by echo in 09/2016. b. 08/2017: echo showing EF remains reduced at 25-30% c. s/p [employer] ICD placement in 01/2018  . CHF  (congestive heart failure) ([place])   . COPD (chronic obstructive pulmonary disease) ([place])   . Diabetes mellitus, type 2 ([place])   . Essential hypertension   . Mitral regurgitation    Moderate  . Mixed hyperlipidemia   . Myocardial infarction ([place]) 05/23/2012   Inferolateral STEMI  . Obesity     Past Surgical History:  Procedure Laterality Date  . CARDIAC CATHETERIZATION  2 yrs ago  . CARDIAC CATHETERIZATION N/A 05/15/2016   Procedure: Right/Left Heart Cath and Coronary Angiography;  Surgeon: [name], MD;  Location: [facility];  Service: Cardiovascular;  Laterality: N/A;  . CORONARY STENT PLACEMENT  05/23/12  . ICD IMPLANT N/A 02/01/2018   Procedure: ICD IMPLANT;  Surgeon: Evans Lance, MD;  Location: [facility];  Service: Cardiovascular;  Laterality: N/A;  . LEFT HEART CATHETERIZATION WITH CORONARY ANGIOGRAM N/A 05/23/2012   Procedure: LEFT HEART CATHETERIZATION WITH CORONARY ANGIOGRAM;  Surgeon: [name], MD;  Location: [facility] CATH LAB;  Service: Cardiovascular;  Laterality: N/A;  . None    . PERCUTANEOUS CORONARY STENT INTERVENTION (PCI-S) N/A 05/23/2012   Procedure: PERCUTANEOUS CORONARY STENT INTERVENTION (PCI-S);  Surgeon: [name], MD;  Location: [facility] CATH LAB;  Service: Cardiovascular;  Laterality: N/A;  . POLYPECTOMY  10/16/2011   Procedure: POLYPECTOMY;  Surgeon: [name], MD;  Location: [facility];  Service: Endoscopy;;  Polypoid Lesion, Transverse and Sigmoid Colon    Current  Medications: Outpatient Medications Prior to Visit  Medication Sig Dispense Refill  . albuterol (VENTOLIN HFA) 108 (90 Base) MCG/ACT inhaler Inhale 2 puffs into the lungs every 6 (six) hours as needed for wheezing or shortness of breath. 18 g 4  . aspirin EC 81 MG EC tablet Take 1 tablet (81 mg total) by mouth daily with breakfast. 30 tablet 3  . busPIRone (BUSPAR) 10 MG tablet Take 1 tablet (10 mg total) by mouth 3 (three) times daily. For anxiety 90 tablet 5  .  famotidine (PEPCID) 20 MG tablet Take 1 tablet (20 mg total) by mouth at bedtime.    [name] glucose blood (FREESTYLE LITE) test strip TEST FOUR TIMES DAILY 100 each 3  . hydrOXYzine (ATARAX/VISTARIL) 25 MG tablet Take one tablet by mouth at bedtime, for anxiety and sleep (Patient taking differently: Take 25 mg by mouth at bedtime as needed. Take one tablet by mouth at bedtime, for anxiety and sleep) 30 tablet 3  . insulin regular (NOVOLIN R,HUMULIN R) 100 units/mL injection Inject 0.1 mLs (10 Units total) into the skin 3 (three) times daily before meals. 10 mL 2  . ipratropium-albuterol (DUONEB) 0.5-2.5 (3) MG/3ML SOLN Take 3 mLs by nebulization every 6 (six) hours as needed. 360 mL 1  . LANTUS SOLOSTAR 100 UNIT/ML Solostar Pen ADMINISTER 50 UNITS UNDER THE SKIN EVERY NIGHT AT BEDTIME (Patient taking differently: Inject 50 Units into the skin every evening. ) 15 mL 2  . losartan (COZAAR) 25 MG tablet Take 1 tablet (25 mg total) by mouth daily. 30 tablet 5  . metoprolol succinate (TOPROL-XL) 50 MG 24 hr tablet TAKE 1 TABLET BY MOUTH EVERY DAY WITH FOOD (Patient taking differently: Take 50 mg by mouth daily. TAKE 1 TABLET BY MOUTH EVERY DAY WITH FOOD) 90 tablet 3  . Multiple Vitamins-Minerals (MULTIVITAMIN WITH MINERALS) tablet Take 1 tablet by mouth daily. 120 tablet 2  . nitroGLYCERIN (NITROSTAT) 0.4 MG SL tablet Place 1 tablet (0.4 mg total) under the tongue every 5 (five) minutes as needed for chest pain. 25 tablet 3  . pantoprazole (PROTONIX) 40 MG tablet Take 1 tablet (40 mg total) by mouth daily. Take 30-4min before first meal of the day. 30 tablet 1  . potassium chloride SA (KLOR-CON) 20 MEQ tablet Take 1 tablet (20 mEq total) by mouth daily. 90 tablet 3  . rosuvastatin (CRESTOR) 40 MG tablet Take 1 tablet (40 mg total) by mouth daily. 30 tablet 3  . sertraline (ZOLOFT) 25 MG tablet Take 1 tablet (25 mg total) by mouth daily. For anxiety/panic attacks 30 tablet 11  . torsemide (DEMADEX) 20 MG tablet  Take 2 tablets (40 mg total) by mouth daily. 30 tablet 1   No facility-administered medications prior to visit.     Allergies:   Entresto [sacubitril-valsartan] and Other   Social History   Socioeconomic History  . Marital status: Married    Spouse name: Not on file  . Number of children: Not on file  . Years of education: Not on file  . Highest education level: Not on file  Occupational History  . Occupation: Therapist, sports: [employer]  Tobacco Use  . Smoking status: Former Smoker    Packs/day: 0.25    Years: 30.00    Pack years: 7.50    Types: Cigars    Quit date: 05/23/2016    Years since quitting: 3.6  . Smokeless tobacco: Never Used  . Tobacco comment: since early 54s  Substance  and Sexual Activity  . Alcohol use: Yes    Alcohol/week: 0.0 standard drinks    Comment: occ  . Drug use: No  . Sexual activity: Not Currently  Other Topics Concern  . Not on file  Social History Narrative  . Not on file   Social Determinants of Health   Financial Resource Strain:   . Difficulty of Paying Living Expenses: Not on file  Food Insecurity:   . Worried About Charity fundraiser in the Last Year: Not on file  . Ran Out of Food in the Last Year: Not on file  Transportation Needs:   . Lack of Transportation (Medical): Not on file  . Lack of Transportation (Non-Medical): Not on file  Physical Activity:   . Days of Exercise per Week: Not on file  . Minutes of Exercise per Session: Not on file  Stress:   . Feeling of Stress : Not on file  Social Connections:   . Frequency of Communication with Friends and Family: Not on file  . Frequency of Social Gatherings with Friends and Family: Not on file  . Attends Religious Services: Not on file  . Active Member of Clubs or Organizations: Not on file  . Attends Archivist Meetings: Not on file  . Marital Status: Not on file     Family History:  The patient's ***family history includes Asthma in his  sister; COPD in his father and mother; Diabetes in his mother; Emphysema in his father and mother; Heart attack in his mother; Heart disease in his mother; Hypertension in his mother; Stroke in his sister.   Review of Systems:   Please see the history of present illness.     General:  No chills, fever, night sweats or weight changes.  Cardiovascular:  No chest pain, dyspnea on exertion, edema, orthopnea, palpitations, paroxysmal nocturnal dyspnea. Dermatological: No rash, lesions/masses Respiratory: No cough, dyspnea Urologic: No hematuria, dysuria Abdominal:   No nausea, vomiting, diarrhea, bright red blood per rectum, melena, or hematemesis Neurologic:  No visual changes, wkns, changes in mental status. All other systems reviewed and are otherwise negative except as noted above.   Physical Exam:    VS:  There were no vitals taken for this visit.   General: Well developed, well nourished,male appearing in no acute distress. Head: Normocephalic, atraumatic, sclera non-icteric.  Neck: No carotid bruits. JVD not elevated.  Lungs: Respirations regular and unlabored, without wheezes or rales.  Heart: ***Regular rate and rhythm. No S3 or S4.  No murmur, no rubs, or gallops appreciated. Abdomen: Soft, non-tender, non-distended. No obvious abdominal masses. Msk:  Strength and tone appear normal for age. No obvious joint deformities or effusions. Extremities: No clubbing or cyanosis. No edema.  Distal pedal pulses are 2+ bilaterally. Neuro: Alert and oriented X 3. Moves all extremities spontaneously. No focal deficits noted. Psych:  Responds to questions appropriately with a normal affect. Skin: No rashes or lesions noted  Wt Readings from Last 3 Encounters:  01/25/20 200 lb 1.6 oz (90.8 kg)  01/18/20 196 lb 10.4 oz (89.2 kg)  01/15/20 205 lb (93 kg)        Studies/Labs Reviewed:   EKG:  EKG is*** ordered today.  The ekg ordered today demonstrates ***  Recent Labs: 11/13/2019: TSH  1.333 01/21/2020: ALT 27; B Natriuretic Peptide 300.0; Hemoglobin 15.4; Platelets 282 01/23/2020: Magnesium 2.2 01/25/2020: BUN 31; Creatinine, Ser 1.30; Potassium 3.9; Sodium 138   Lipid Panel    Component Value Date/Time  CHOL 161 10/23/2019 0955   TRIG 289 (H) 10/23/2019 0955   HDL 41 10/23/2019 0955   CHOLHDL 3.9 10/23/2019 0955   VLDL 27 02/09/2017 1201   LDLCALC 82 10/23/2019 0955   LDLDIRECT TEST NOT PERFORMED 06/19/2011 1230    Additional studies/ records that were reviewed today include:   Cardiac Catheterization: 05/2016  Patent stent in the mid circumflex.  Moderate disease in the RCA and LAD.  Mild pulmonary hypertension.  Cardiac output 5.1 L/m. Cardiac index 2.4  Pulmonary artery saturation 66%.   Cardiomyopathy not related to obstructive coronary artery disease. The patient did state that he drinks alcohol heavily. I did explain him the cutting back on his alcohol may help his cardiac function. Follow-up with Dr. Harl Bowie.   Echocardiogram: 11/13/2019 IMPRESSIONS    1. Left ventricular ejection fraction, by visual estimation, is 25%. The  left ventricle has severely decreased function. There is mildly increased  left ventricular hypertrophy.  2. Multiple segmental abnormalities exist. See findings.  3. Left ventricular diastolic parameters are indeterminate.  4. Moderately dilated left ventricular internal cavity size.  5. Global right ventricle has normal systolic function.The right  ventricular size is normal. No increase in right ventricular wall  thickness.  6. Left atrial size was normal.  7. Right atrial size was normal.  8. Presence of pericardial fat pad.  9. Mild to moderate aortic valve annular calcification.  10. The mitral valve is grossly normal. Mild mitral valve regurgitation.  11. The tricuspid valve is grossly normal. Tricuspid valve regurgitation  is trivial.  12. The aortic valve is tricuspid. Aortic valve regurgitation is  trivial.  13. The pulmonic valve was grossly normal. Pulmonic valve regurgitation is  not visualized.  14. TR signal is inadequate for assessing pulmonary artery systolic  pressure.  15. A pacer wire is visualized.  16. The inferior vena cava is normal in size with greater than 50%  respiratory variability, suggesting right atrial pressure of 3 mmHg.   Assessment:    No diagnosis found.   Plan:   In order of problems listed above:  1. ***    Medication Adjustments/Labs and Tests Ordered: Current medicines are reviewed at length with the patient today.  Concerns regarding medicines are outlined above.  Medication changes, Labs and Tests ordered today are listed in the Patient Instructions below. There are no Patient Instructions on file for this visit.   Signed, Erma Heritage, PA-C  02/02/2020 11:25 AM    [facility] [address] [place], [place] [address] Phone: [phone] Fax: [phone]

## 2020-02-05 ENCOUNTER — Ambulatory Visit: Payer: BC Managed Care – PPO | Admitting: Student

## 2020-02-06 ENCOUNTER — Telehealth: Payer: Self-pay

## 2020-02-06 NOTE — Telephone Encounter (Signed)
FMLA -one form  Copied Noted sleeved

## 2020-02-09 ENCOUNTER — Encounter: Payer: Self-pay | Admitting: Adult Health

## 2020-02-09 ENCOUNTER — Ambulatory Visit: Payer: BC Managed Care – PPO | Admitting: Adult Health

## 2020-02-09 ENCOUNTER — Other Ambulatory Visit: Payer: Self-pay

## 2020-02-09 ENCOUNTER — Inpatient Hospital Stay: Payer: BC Managed Care – PPO | Admitting: Adult Health

## 2020-02-09 VITALS — BP 124/68 | HR 69 | Temp 97.5°F | Ht 70.0 in | Wt 208.0 lb

## 2020-02-09 DIAGNOSIS — I5043 Acute on chronic combined systolic (congestive) and diastolic (congestive) heart failure: Secondary | ICD-10-CM

## 2020-02-09 DIAGNOSIS — J441 Chronic obstructive pulmonary disease with (acute) exacerbation: Secondary | ICD-10-CM | POA: Diagnosis not present

## 2020-02-09 DIAGNOSIS — G4733 Obstructive sleep apnea (adult) (pediatric): Secondary | ICD-10-CM | POA: Diagnosis not present

## 2020-02-09 DIAGNOSIS — R0600 Dyspnea, unspecified: Secondary | ICD-10-CM

## 2020-02-09 DIAGNOSIS — J811 Chronic pulmonary edema: Secondary | ICD-10-CM

## 2020-02-09 NOTE — Patient Instructions (Addendum)
Set up for Sleep study .  Chest xray today .  Labs today .  Albuterol Inhaler As needed  Wheezing .  Activity as tolerated.  Follow up Dr. Halford Chessman  As planned and As needed .  Please contact office for sooner follow up if symptoms do not improve or worsen or seek emergency care

## 2020-02-09 NOTE — Progress Notes (Signed)
@Patient  ID: Antonio Cobb, male    DOB: 1961-10-21, 59 y.o.   MRN: [id number]    Referring provider: [name], MD  HPI: 59 year old male former smoker seen for pulmonary consult during hospitalization January 24, 2020 for pulmonary edema, acute hypercarbic and hypoxic respiratory failure Medical history significant for coronary disease, systolic heart failure status post ICD, COPD, diabetes, chronic kidney disease.  TEST/EVENTS :  2D echo November 13, 2019 EF 25%   02/09/2020 Follow up : Post hospital follow up  Patient returns for a post hospital follow-up.  Patient was admitted early February 2021 for acute pulmonary edema, acute hypercarbic and hypoxic respiratory failure with decompensated congestive heart failure.  Patient was treated with BiPAP support.  And diuresis.  Patient was noted to have similar admission in December 2020 felt to be secondary to hypertensive urgency and possibly underlying obstructive sleep apnea as he has witnessed snoring and nocturnal hypoxemia.  Patient says he has daytime sleepiness mild snoring and restless sleep.  As above he does have underlying congestive heart failure and hypertension. Previous PFTs in 2018 show no airflow obstruction, moderate restriction and positive mid flow reversibility.  Patient has tried inhalers in the past with no perceived benefit. Patient says he does work full-time.  Gets short of breath with minimum activity.  Since discharge says he is feeling better less short of breath and no increased leg swelling.  He has some cough that is minimally productive   Allergies  Allergen Reactions  . Entresto [Sacubitril-Valsartan]     Dizziness, Coughing  . Other     Seasonal allergies  - has to use inhaler    Immunization History  Administered Date(s) Administered  . Influenza Split 11/11/2011, 09/07/2012  . Influenza,inj,Quad PF,6+ Mos 03/12/2016, 10/08/2016, 09/14/2017, 01/30/2019, 01/30/2019, 10/11/2019  .  Pneumococcal Polysaccharide-23 08/13/2011, 10/08/2016  . Td 07/01/2004  . Tdap 07/05/2014    Past Medical History:  Diagnosis Date  . Alcohol abuse   . CAD (coronary artery disease)    a. s/p inferolateral STEMI in 05/2012 with DES to LCx b. patent stent by cath in 05/2016 with moderate RCA and LAD disease  . Cardiomyopathy ([place])    a. LVEF 30-35% by echo in 09/2016. b. 08/2017: echo showing EF remains reduced at 25-30% c. s/p [employer] ICD placement in 01/2018  . CHF (congestive heart failure) ([place])   . COPD (chronic obstructive pulmonary disease) ([place])   . Diabetes mellitus, type 2 ([place])   . Essential hypertension   . Mitral regurgitation    Moderate  . Mixed hyperlipidemia   . Myocardial infarction ([place]) 05/23/2012   Inferolateral STEMI  . Obesity     Tobacco History: Social History   Tobacco Use  Smoking Status Former Smoker  . Packs/day: 0.25  . Years: 30.00  . Pack years: 7.50  . Types: Cigars  . Quit date: 05/23/2016  . Years since quitting: 3.7  Smokeless Tobacco Never Used  Tobacco Comment   since early 37s   Counseling given: Not Answered Comment: since early 20s   Outpatient Medications Prior to Visit  Medication Sig Dispense Refill  . albuterol (VENTOLIN HFA) 108 (90 Base) MCG/ACT inhaler Inhale 2 puffs into the lungs every 6 (six) hours as needed for wheezing or shortness of breath. 18 g 4  . aspirin EC 81 MG EC tablet Take 1 tablet (81 mg total) by mouth daily with breakfast. 30 tablet 3  . busPIRone (BUSPAR) 10 MG tablet Take 1 tablet (  10 mg total) by mouth 3 (three) times daily. For anxiety 90 tablet 5  . docusate sodium (COLACE) 100 MG capsule Take 100 mg by mouth daily.    . famotidine (PEPCID) 20 MG tablet Take 1 tablet (20 mg total) by mouth at bedtime.    [name] glucose blood (FREESTYLE LITE) test strip TEST FOUR TIMES DAILY 100 each 3  . hydrOXYzine (ATARAX/VISTARIL) 25 MG tablet Take one tablet by mouth at bedtime, for anxiety and sleep  (Patient taking differently: Take 25 mg by mouth at bedtime as needed. Take one tablet by mouth at bedtime, for anxiety and sleep) 30 tablet 3  . insulin regular (NOVOLIN R,HUMULIN R) 100 units/mL injection Inject 0.1 mLs (10 Units total) into the skin 3 (three) times daily before meals. 10 mL 2  . ipratropium-albuterol (DUONEB) 0.5-2.5 (3) MG/3ML SOLN Take 3 mLs by nebulization every 6 (six) hours as needed. 360 mL 1  . LANTUS SOLOSTAR 100 UNIT/ML Solostar Pen ADMINISTER 50 UNITS UNDER THE SKIN EVERY NIGHT AT BEDTIME (Patient taking differently: Inject 50 Units into the skin every evening. ) 15 mL 2  . losartan (COZAAR) 25 MG tablet Take 1 tablet (25 mg total) by mouth daily. 30 tablet 5  . Melatonin 10 MG TABS Take 2 tablets by mouth at bedtime.    . metoprolol succinate (TOPROL-XL) 50 MG 24 hr tablet TAKE 1 TABLET BY MOUTH EVERY DAY WITH FOOD (Patient taking differently: Take 50 mg by mouth daily. TAKE 1 TABLET BY MOUTH EVERY DAY WITH FOOD) 90 tablet 3  . Multiple Vitamins-Minerals (MULTIVITAMIN WITH MINERALS) tablet Take 1 tablet by mouth daily. 120 tablet 2  . pantoprazole (PROTONIX) 40 MG tablet Take 1 tablet (40 mg total) by mouth daily. Take 30-82min before first meal of the day. 30 tablet 1  . potassium chloride SA (KLOR-CON) 20 MEQ tablet Take 1 tablet (20 mEq total) by mouth daily. 90 tablet 3  . rosuvastatin (CRESTOR) 40 MG tablet Take 1 tablet (40 mg total) by mouth daily. 30 tablet 3  . sertraline (ZOLOFT) 25 MG tablet Take 1 tablet (25 mg total) by mouth daily. For anxiety/panic attacks 30 tablet 11  . torsemide (DEMADEX) 20 MG tablet Take 2 tablets (40 mg total) by mouth daily. 30 tablet 1  . nitroGLYCERIN (NITROSTAT) 0.4 MG SL tablet Place 1 tablet (0.4 mg total) under the tongue every 5 (five) minutes as needed for chest pain. 25 tablet 3   No facility-administered medications prior to visit.     Review of Systems:   Constitutional:   No  weight loss, night sweats,  Fevers,  chills,  +fatigue, or  lassitude.  HEENT:   No headaches,  Difficulty swallowing,  Tooth/dental problems, or  Sore throat,                No sneezing, itching, ear ache, nasal congestion, post nasal drip,   CV:  No chest pain,  Orthopnea, PND,  dizziness, palpitations, syncope.   GI  No heartburn, indigestion, abdominal pain, nausea, vomiting, diarrhea, change in bowel habits, loss of appetite, bloody stools.   Resp:    No chest wall deformity  Skin: no rash or lesions.  GU: no dysuria, change in color of urine, no urgency or frequency.  No flank pain, no hematuria   MS:  No joint pain or swelling.  No decreased range of motion.  No back pain.    Physical Exam  BP 124/68 (BP Location: Left Arm, Cuff Size: Normal)  Pulse 69   Temp (!) 97.5 F (36.4 C) (Temporal)   Ht 5\' 10"  (1.778 m)   Wt 208 lb (94.3 kg)   SpO2 98% Comment: RA  BMI 29.84 kg/m   GEN: A/Ox3; pleasant , NAD, well nourished    HEENT:  [place]/AT,  EACs-clear, TMs-wnl, NOSE-clear, THROAT-clear, no lesions, no postnasal drip or exudate noted.   NECK:  Supple w/ fair ROM; no JVD; normal carotid impulses w/o bruits; no thyromegaly or nodules palpated; no lymphadenopathy.    RESP  Clear  P & A; w/o, wheezes/ rales/ or rhonchi. no accessory muscle use, no dullness to percussion  CARD:  RRR, no m/r/g, no peripheral edema, pulses intact, no cyanosis or clubbing.  GI:   Soft & nt; nml bowel sounds; no organomegaly or masses detected.   Musco: Warm bil, no deformities or joint swelling noted.   Neuro: alert, no focal deficits noted.    Skin: Warm, no lesions or rashes    Lab Results:  CBC    Component Value Date/Time   WBC 13.0 (H) 01/21/2020 0615   RBC 5.42 01/21/2020 0615   HGB 15.4 01/21/2020 0615   HCT 49.9 01/21/2020 0615   PLT 282 01/21/2020 0615   MCV 92.1 01/21/2020 0615   MCH 28.4 01/21/2020 0615   MCHC 30.9 01/21/2020 0615   RDW 12.2 01/21/2020 0615   LYMPHSABS 5.0 (H) 01/21/2020 0615    MONOABS 0.9 01/21/2020 0615   EOSABS 1.5 (H) 01/21/2020 0615   BASOSABS 0.2 (H) 01/21/2020 0615    BMET    Component Value Date/Time   NA 138 01/25/2020 0514   K 3.9 01/25/2020 0514   CL 105 01/25/2020 0514   CO2 25 01/25/2020 0514   GLUCOSE 134 (H) 01/25/2020 0514   BUN 31 (H) 01/25/2020 0514   CREATININE 1.30 (H) 01/25/2020 0514   CREATININE 1.85 (H) 11/30/2019 1550   CALCIUM 8.9 01/25/2020 0514   GFRNONAA 60 (L) 01/25/2020 0514   GFRNONAA 39 (L) 11/30/2019 1550   GFRAA >60 01/25/2020 0514   GFRAA 45 (L) 11/30/2019 1550    BNP    Component Value Date/Time   BNP 300.0 (H) 01/21/2020 0615    ProBNP No results found for: PROBNP  Imaging: DG CHEST PORT 1 VIEW  Result Date: 01/23/2020 CLINICAL DATA:  CHF exacerbation last night, shortness of chest tightness (now resolved) EXAM: PORTABLE CHEST 1 VIEW COMPARISON:  01/22/2020 FINDINGS: Increased interstitial markings/pulmonary vascular congestion in the bilateral upper lobes. No focal consolidation. No pleural effusion or pneumothorax. The heart is normal in size.  Left subclavian ICD. IMPRESSION: Mild residual increased interstitial markings/pulmonary vascular congestion in the bilateral upper lobes, unchanged. No frank interstitial edema. Electronically Signed   By: Julian Hy M.D.   On: 01/23/2020 10:14   DG CHEST PORT 1 VIEW  Result Date: 01/22/2020 CLINICAL DATA:  Cough and intermittent shortness of breath EXAM: PORTABLE CHEST 1 VIEW COMPARISON:  01/21/2020 FINDINGS: There is a left chest wall ICD with lead in the right ventricle. Heart size normal. Interval decrease in interstitial opacities identified on the previous exam. Scar noted within the right upper lobe. IMPRESSION: 1. Interval improvement in previously noted pulmonary edema pattern. Electronically Signed   By: Kerby Moors M.D.   On: 01/22/2020 09:37   DG Chest Port 1 View  Result Date: 01/21/2020 CLINICAL DATA:  59 year old male with hypoxia and shortness  of breath. EXAM: PORTABLE CHEST 1 VIEW COMPARISON:  Portable chest 01/18/2020 and earlier. FINDINGS: Portable AP  upright view at 0624 hours. Stable lung volumes and mediastinal contours. Stable left chest AICD. Stable ventilation since yesterday with residual bilateral interstitial opacity, improved compared to 01/17/2020. Superimposed chronic right upper lobe scarring. No pneumothorax. No pleural effusion is evident. Visualized tracheal air column is within normal limits. Negative visible bowel gas pattern. No acute osseous abnormality identified. IMPRESSION: Stable chest since yesterday, decreased but not resolved diffuse pulmonary interstitial opacity since 01/17/2020. Electronically Signed   By: Genevie Ann M.D.   On: 01/21/2020 06:45   DG CHEST PORT 1 VIEW  Result Date: 01/18/2020 CLINICAL DATA:  Shortness of breath. EXAM: PORTABLE CHEST 1 VIEW COMPARISON:  01/17/2020 FINDINGS: Interval significant improved lung aeration, likely resolving/resolved pulmonary edema. No pleural effusions. No pulmonary lesions. IMPRESSION: Resolving/resolved pulmonary edema. Electronically Signed   By: Marijo Sanes M.D.   On: 01/18/2020 05:35   DG Chest Port 1 View  Result Date: 01/17/2020 CLINICAL DATA:  Shortness of breath EXAM: PORTABLE CHEST 1 VIEW COMPARISON:  11/19/2019 FINDINGS: Cardiac shadow is mildly enlarged but stable. Defibrillator is again seen. Vascular congestion is noted with changes of pulmonary edema increased from the prior exam. No sizable effusion is noted. No bony abnormality is seen. IMPRESSION: Increasing pulmonary edema when compare with the prior study. Electronically Signed   By: Inez Catalina M.D.   On: 01/17/2020 02:38      PFT Results Latest Ref Rng & Units 06/29/2017  FVC-Pre L 2.63  FVC-Predicted Pre % 64  FVC-Post L 2.81  FVC-Predicted Post % 69  Pre FEV1/FVC % % 80  Post FEV1/FCV % % 82  FEV1-Pre L 2.11  FEV1-Predicted Pre % 66  FEV1-Post L 2.31  DLCO UNC% % 71  DLCO COR  %Predicted % 97  TLC L 4.56  TLC % Predicted % 65  RV % Predicted % 87    No results found for: NITRICOXIDE      Assessment & Plan:   No problem-specific Assessment & Plan notes found for this encounter.     Rexene Edison, NP 02/09/2020

## 2020-02-11 ENCOUNTER — Ambulatory Visit: Payer: BC Managed Care – PPO | Attending: Internal Medicine

## 2020-02-11 DIAGNOSIS — Z23 Encounter for immunization: Secondary | ICD-10-CM | POA: Insufficient documentation

## 2020-02-11 NOTE — Progress Notes (Signed)
   Covid-19 Vaccination Clinic  Name:  Antonio Cobb    MRN: [id number] DOB: 23-Aug-1961  02/11/2020  Antonio Cobb was observed post Covid-19 immunization for 15 minutes without incidence. He was provided with Vaccine Information Sheet and instruction to access the V-Safe system.   Antonio Cobb was instructed to call 911 with any severe reactions post vaccine: [name] Difficulty breathing  . Swelling of your face and throat  . A fast heartbeat  . A bad rash all over your body  . Dizziness and weakness    Immunizations Administered    Name Date Dose VIS Date Route   Moderna COVID-19 Vaccine 02/11/2020  3:15 PM 0.5 mL 11/14/2019 Intramuscular   Manufacturer: Moderna   Lot: [id number]   [id number]

## 2020-02-12 ENCOUNTER — Ambulatory Visit (INDEPENDENT_AMBULATORY_CARE_PROVIDER_SITE_OTHER): Payer: BC Managed Care – PPO | Admitting: *Deleted

## 2020-02-12 ENCOUNTER — Other Ambulatory Visit (HOSPITAL_BASED_OUTPATIENT_CLINIC_OR_DEPARTMENT_OTHER): Payer: Self-pay

## 2020-02-12 DIAGNOSIS — I5022 Chronic systolic (congestive) heart failure: Secondary | ICD-10-CM

## 2020-02-12 LAB — CUP PACEART REMOTE DEVICE CHECK
Battery Remaining Longevity: 180 mo
Battery Remaining Percentage: 100 %
Brady Statistic RV Percent Paced: 0 %
Date Time Interrogation Session: 20210301002400
HighPow Impedance: 71 Ohm
Implantable Lead Implant Date: 20190219
Implantable Lead Location: 753860
Implantable Lead Model: 293
Implantable Lead Serial Number: 438538
Implantable Pulse Generator Implant Date: 20190219
Lead Channel Impedance Value: 662 Ohm
Lead Channel Setting Pacing Amplitude: 2.5 V
Lead Channel Setting Pacing Pulse Width: 0.4 ms
Lead Channel Setting Sensing Sensitivity: 0.5 mV
Pulse Gen Serial Number: 248173

## 2020-02-12 NOTE — Assessment & Plan Note (Signed)
Patient has daytime sleepiness restless sleep multiple cardiac risk factors-due to his congestive heart failure will need in lab sleep study.  Plan  Patient Instructions  Set up for Sleep study .  Chest xray today .  Labs today .  Albuterol Inhaler As needed  Wheezing .  Activity as tolerated.  Follow up Dr. Halford Chessman  As planned and As needed .  Please contact office for sooner follow up if symptoms do not improve or worsen or seek emergency care

## 2020-02-12 NOTE — Assessment & Plan Note (Signed)
Recent decompensation with pulmonary edema.  Improved with diuresis. We will check for underlying obstructive sleep apnea which could be aggravating his CHF   Plan  Patient Instructions  Set up for Sleep study .  Chest xray today .  Labs today .  Albuterol Inhaler As needed  Wheezing .  Activity as tolerated.  Follow up Dr. Halford Chessman  As planned and As needed .  Please contact office for sooner follow up if symptoms do not improve or worsen or seek emergency care

## 2020-02-12 NOTE — Progress Notes (Signed)
ICD Remote  

## 2020-02-12 NOTE — Assessment & Plan Note (Signed)
Recent admission with decompensated congestive heart failure pulmonary edema on chest x-ray.  Repeat chest x-ray today.  Patient is clinically improved after diuresis and BiPAP support.

## 2020-02-12 NOTE — Assessment & Plan Note (Addendum)
Patient is a former smoker.  Previous PFTs showed moderate restriction with no airflow obstruction.  Has tried some inhalers in the past with no perceived benefit.  For now will use albuterol as needed.  Will repeat PFTs going forward.  Check alpha-1 antitrypsin

## 2020-02-17 LAB — ALPHA-1 ANTITRYPSIN PHENOTYPE: A-1 Antitrypsin, Ser: 143 mg/dL (ref 83–199)

## 2020-02-19 ENCOUNTER — Other Ambulatory Visit (HOSPITAL_COMMUNITY): Payer: BC Managed Care – PPO

## 2020-02-19 ENCOUNTER — Other Ambulatory Visit: Payer: Self-pay

## 2020-02-19 ENCOUNTER — Other Ambulatory Visit (HOSPITAL_COMMUNITY)
Admission: RE | Admit: 2020-02-19 | Discharge: 2020-02-19 | Disposition: A | Discharge status: Home / Self Care | Payer: BC Managed Care – PPO | Source: Ambulatory Visit | Attending: Pulmonary Disease | Admitting: Pulmonary Disease

## 2020-02-19 DIAGNOSIS — Z20822 Contact with and (suspected) exposure to covid-19: Secondary | ICD-10-CM | POA: Insufficient documentation

## 2020-02-19 DIAGNOSIS — Z01812 Encounter for preprocedural laboratory examination: Secondary | ICD-10-CM | POA: Diagnosis not present

## 2020-02-20 LAB — SARS CORONAVIRUS 2 (TAT 6-24 HRS): SARS Coronavirus 2: NEGATIVE

## 2020-02-21 ENCOUNTER — Emergency Department (HOSPITAL_COMMUNITY)
Admission: EM | Admit: 2020-02-21 | Discharge: 2020-02-21 | Discharge status: Absent without leave | Payer: BC Managed Care – PPO

## 2020-02-21 ENCOUNTER — Ambulatory Visit (INDEPENDENT_AMBULATORY_CARE_PROVIDER_SITE_OTHER): Payer: BC Managed Care – PPO | Admitting: Pulmonary Disease

## 2020-02-21 ENCOUNTER — Ambulatory Visit: Payer: BC Managed Care – PPO | Attending: Adult Health | Admitting: Pulmonary Disease

## 2020-02-21 ENCOUNTER — Other Ambulatory Visit: Payer: Self-pay

## 2020-02-21 ENCOUNTER — Encounter: Payer: Self-pay | Admitting: Pulmonary Disease

## 2020-02-21 VITALS — BP 122/62 | HR 80 | Temp 97.6°F | Ht 70.0 in | Wt 201.8 lb

## 2020-02-21 DIAGNOSIS — G4733 Obstructive sleep apnea (adult) (pediatric): Secondary | ICD-10-CM | POA: Diagnosis not present

## 2020-02-21 DIAGNOSIS — R0683 Snoring: Secondary | ICD-10-CM

## 2020-02-21 DIAGNOSIS — R0902 Hypoxemia: Secondary | ICD-10-CM | POA: Diagnosis not present

## 2020-02-21 NOTE — Progress Notes (Signed)
Pulmonary, Critical Care, and Sleep Medicine  Chief Complaint  Patient presents with  . Consult    snores some nights per Wife, sleeps with head elevated helps, tired during daytime, feels like he doesn't get enough sleep    Constitutional:  BP 122/62 (BP Location: Left Arm, Cuff Size: Normal)   Pulse 80   Temp 97.6 F (36.4 C) (Temporal)   Ht 5\' 10"  (1.778 m)   Wt 201 lb 12.8 oz (91.5 kg)   SpO2 97%   BMI 28.96 kg/m   Past Medical History:  ETOH, CAD, ischemic CM, COPD, DM, HTN, HLD  Brief Summary:  Antonio Cobb is a 59 y.o. male former smoker with snoring.  He was seen by Dr. Melvyn Cobb in [facility] in January and Antonio Cobb in February.  He has sleep study scheduled at [facility] for tonight.    He snores.  He wakes up feeling like he can't breath.  Seems to have episodes of CHF exacerbations at night more often.  Goes to bed at 1030 pm.  Wakes up at 540 am.  Uses bathroom sometimes at night.  Not using anything to help sleep or stay awake.  Feels tired during the day.  Epworth score is 15 out of 24.   Physical Exam:   Appearance - well kempt   ENMT - clear nasal mucosa, midline nasal  septum, no oral exudates, no LAN, trachea midline, poor denitition  Respiratory - normal chest wall, normal respiratory effort, no accessory muscle use, no wheeze/rales  CV - s1s2 regular rate and rhythm, no murmurs, no peripheral edema, radial pulses symmetric  GI - soft, non tender, no masses  Lymph - no adenopathy noted in neck and axillary areas  MSK - normal gait  Ext - no cyanosis, clubbing, or joint inflammation noted  Skin - no rashes, lesions, or ulcers  Neuro - normal strength, oriented x 3  Psych - normal mood and affect   Assessment/Plan:   Snoring with excessive daytime sleepiness. - given history of CHF he could have both obstructive and central sleep apnea - he is scheduled for an in lab sleep study  Obesity. - discussed how weight can impact sleep and risk for  sleep disordered breathing - discussed options to assist with weight loss: combination of diet modification, cardiovascular and strength training exercises  Cardiovascular risk. - had an extensive discussion regarding the adverse health consequences related to untreated sleep disordered breathing - specifically discussed the risks for hypertension, coronary artery disease, cardiac dysrhythmias, cerebrovascular disease, and diabetes - lifestyle modification discussed  Safe driving practices. - discussed how sleep disruption can increase risk of accidents, particularly when driving - safe driving practices were discussed  Therapies for obstructive sleep apnea. - if the sleep study shows significant sleep apnea, then various therapies for treatment were reviewed: CPAP, oral appliance, and surgical interventions  CAD with ischemic CM. - followed by Dr. Harl Cobb with cardiology   Patient Instructions  Will call to set up appointment after your sleep study results are available   Time spent 37 minutes  Antonio Mires, MD [place] Pager: [phone] 02/21/2020, 10:26 AM  Flow Sheet     Pulmonary tests:  PFT 06/29/17 >> FEV1 2.31 (72%), FEV1% 82, TLC 4.56 (65%), DLCO 71%  Sleep tests:    Cardiac tests:  Echo 11/13/19 >> EF 25%, mild LVH  Review of Systems:  Constitutional: Negative for fever and unexpected weight change.  HENT: Negative for congestion, dental problem, ear pain, nosebleeds,  postnasal drip, rhinorrhea, sinus pressure, sneezing, sore throat and trouble swallowing.   Eyes: Negative for redness and itching.  Respiratory: Positive for cough. Negative for chest tightness, shortness of breath and wheezing.   Cardiovascular: Negative for palpitations and leg swelling.  Gastrointestinal: Negative for nausea and vomiting.  Genitourinary: Negative for dysuria.  Musculoskeletal: Negative for joint swelling.  Skin: Negative for rash.    Allergic/Immunologic: Negative.  Negative for environmental allergies, food allergies and immunocompromised state.  Neurological: Negative for headaches.  Hematological: Does not bruise/bleed easily.  Psychiatric/Behavioral: Negative for dysphoric mood. The patient is nervous/anxious.    Medications:   Allergies as of 02/21/2020      Reactions   Entresto [sacubitril-valsartan]    Dizziness, Coughing   Other    Seasonal allergies  - has to use inhaler      Medication List       Accurate as of February 21, 2020 10:26 AM. If you have any questions, ask your nurse or doctor.        albuterol 108 (90 Base) MCG/ACT inhaler Commonly known as: VENTOLIN HFA Inhale 2 puffs into the lungs every 6 (six) hours as needed for wheezing or shortness of breath.   aspirin 81 MG EC tablet Take 1 tablet (81 mg total) by mouth daily with breakfast.   busPIRone 10 MG tablet Commonly known as: BUSPAR Take 1 tablet (10 mg total) by mouth 3 (three) times daily. For anxiety   docusate sodium 100 MG capsule Commonly known as: COLACE Take 100 mg by mouth daily.   famotidine 20 MG tablet Commonly known as: PEPCID Take 1 tablet (20 mg total) by mouth at bedtime.   FREESTYLE LITE test strip Generic drug: glucose blood TEST FOUR TIMES DAILY   hydrOXYzine 25 MG tablet Commonly known as: ATARAX/VISTARIL Take one tablet by mouth at bedtime, for anxiety and sleep What changed:   how much to take  how to take this  when to take this  reasons to take this   insulin regular 100 units/mL injection Commonly known as: NOVOLIN R Inject 0.1 mLs (10 Units total) into the skin 3 (three) times daily before meals.   ipratropium-albuterol 0.5-2.5 (3) MG/3ML Soln Commonly known as: DUONEB Take 3 mLs by nebulization every 6 (six) hours as needed.   Lantus SoloStar 100 UNIT/ML Solostar Pen Generic drug: insulin glargine ADMINISTER 50 UNITS UNDER THE SKIN EVERY NIGHT AT BEDTIME What changed: See the new  instructions.   losartan 25 MG tablet Commonly known as: COZAAR Take 1 tablet (25 mg total) by mouth daily.   Melatonin 10 MG Tabs Take 2 tablets by mouth at bedtime.   metoprolol succinate 50 MG 24 hr tablet Commonly known as: TOPROL-XL TAKE 1 TABLET BY MOUTH EVERY DAY WITH FOOD What changed:   how much to take  how to take this  when to take this   multivitamin with minerals tablet Take 1 tablet by mouth daily.   nitroGLYCERIN 0.4 MG SL tablet Commonly known as: Nitrostat Place 1 tablet (0.4 mg total) under the tongue every 5 (five) minutes as needed for chest pain.   pantoprazole 40 MG tablet Commonly known as: Protonix Take 1 tablet (40 mg total) by mouth daily. Take 30-9min before first meal of the day.   potassium chloride SA 20 MEQ tablet Commonly known as: KLOR-CON Take 1 tablet (20 mEq total) by mouth daily.   rosuvastatin 40 MG tablet Commonly known as: CRESTOR Take 1 tablet (40 mg total) by  mouth daily.   sertraline 25 MG tablet Commonly known as: Zoloft Take 1 tablet (25 mg total) by mouth daily. For anxiety/panic attacks   torsemide 20 MG tablet Commonly known as: DEMADEX Take 2 tablets (40 mg total) by mouth daily.       Past Surgical History:  He  has a past surgical history that includes None; Cardiac catheterization (2 yrs ago); polypectomy (10/16/2011); Coronary stent placement (05/23/12); left heart catheterization with coronary angiogram (N/A, 05/23/2012); percutaneous coronary stent intervention (pci-s) (N/A, 05/23/2012); Cardiac catheterization (N/A, 05/15/2016); and ICD IMPLANT (N/A, 02/01/2018).  Family History:  His family history includes Asthma in his sister; COPD in his father and mother; Diabetes in his mother; Emphysema in his father and mother; Heart attack in his mother; Heart disease in his mother; Hypertension in his mother; Stroke in his sister.  Social History:  He  reports that he quit smoking about 3 years ago. His smoking use  included cigars. He has a 7.50 pack-year smoking history. He has never used smokeless tobacco. He reports current alcohol use. He reports that he does not use drugs.

## 2020-02-21 NOTE — Progress Notes (Signed)
   Subjective:    Patient ID: Antonio Cobb, male    DOB: 1961/04/04, 59 y.o.   MRN: [id number]  HPI    Review of Systems  Constitutional: Negative for fever and unexpected weight change.  HENT: Negative for congestion, dental problem, ear pain, nosebleeds, postnasal drip, rhinorrhea, sinus pressure, sneezing, sore throat and trouble swallowing.   Eyes: Negative for redness and itching.  Respiratory: Positive for cough. Negative for chest tightness, shortness of breath and wheezing.   Cardiovascular: Negative for palpitations and leg swelling.  Gastrointestinal: Negative for nausea and vomiting.  Genitourinary: Negative for dysuria.  Musculoskeletal: Negative for joint swelling.  Skin: Negative for rash.  Allergic/Immunologic: Negative.  Negative for environmental allergies, food allergies and immunocompromised state.  Neurological: Negative for headaches.  Hematological: Does not bruise/bleed easily.  Psychiatric/Behavioral: Negative for dysphoric mood. The patient is nervous/anxious.        Objective:   Physical Exam        Assessment & Plan:

## 2020-02-21 NOTE — Patient Instructions (Signed)
Will call to set up appointment after your sleep study results are available

## 2020-02-23 ENCOUNTER — Telehealth: Payer: Self-pay | Admitting: Pulmonary Disease

## 2020-02-23 NOTE — Telephone Encounter (Signed)
PSG 02/21/20 >> AHI 18.9, SpO2 low 81%.   Please inform him that his sleep study shows moderate obstructive sleep apnea.  Please arrange for ROV with me in [place] or [place] office, or NP in [place] office to discuss treatment options.

## 2020-02-23 NOTE — Procedures (Signed)
    Patient Name: Antonio Cobb, Antonio Cobb Date: 02/21/2020 Gender: Male D.O.B: 1961-11-21 Age (years): 81 Referring Provider: Lynelle Smoke Parrett Height (inches): 70 Interpreting Physician: Chesley Mires MD, ABSM Weight (lbs): 201 RPSGT: Peak, Robert BMI: 29 MRN: [id number] Neck Size: 17.00  CLINICAL INFORMATION Sleep Study Type: NPSG  Indication for sleep study: Snoring, sleep disruption, apnea, daytime sleepiness.  Epworth Sleepiness Score: 4  SLEEP STUDY TECHNIQUE As per the AASM Manual for the Scoring of Sleep and Associated Events v2.3 (April 2016) with a hypopnea requiring 4% desaturations.  The channels recorded and monitored were frontal, central and occipital EEG, electrooculogram (EOG), submentalis EMG (chin), nasal and oral airflow, thoracic and abdominal wall motion, anterior tibialis EMG, snore microphone, electrocardiogram, and pulse oximetry.  MEDICATIONS Medications self-administered by patient taken the night of the study : N/A  SLEEP ARCHITECTURE The study was initiated at 10:06:38 PM and ended at 5:23:35 AM.  Sleep onset time was 2.1 minutes and the sleep efficiency was 95.4%%. The total sleep time was 416.9 minutes.  Stage REM latency was 212.0 minutes.  The patient spent 11.6%% of the night in stage N1 sleep, 67.4%% in stage N2 sleep, 0.0%% in stage N3 and 21% in REM.  Alpha intrusion was absent.  Supine sleep was 15.35%.  RESPIRATORY PARAMETERS The overall apnea/hypopnea index (AHI) was 18.9 per hour. There were 1 total apneas, including 0 obstructive, 1 central and 0 mixed apneas. There were 130 hypopneas and 27 RERAs.  The AHI during Stage REM sleep was 32.2 per hour.  AHI while supine was 53.4 per hour.  The mean oxygen saturation was 93.3%. The minimum SpO2 during sleep was 81.0%.  moderate snoring was noted during this study.  CARDIAC DATA The 2 lead EKG demonstrated sinus rhythm. The mean heart rate was 67.0 beats per minute. Other EKG  findings include: PVCs.  LEG MOVEMENT DATA The total PLMS were 0 with a resulting PLMS index of 0.0. Associated arousal with leg movement index was 0.0 .  IMPRESSIONS - Moderate obstructive sleep apnea with an AHI of 18.9 and SpO2 low of 81%. DIAGNOSIS - Obstructive Sleep Apnea (327.23 [G47.33 ICD-10]) - Nocturnal Hypoxemia (327.26 [G47.36 ICD-10])  RECOMMENDATIONS - Follow up in pulmonary/sleep medicine clinic to discuss additional treatment options.  [Electronically signed] 02/23/2020 04:49 PM  Chesley Mires MD, [place], American Board of Sleep Medicine   NPI: [id number]

## 2020-02-23 NOTE — Telephone Encounter (Signed)
Called the patient and made him aware of the results. Patient voiced understanding and set up for an appointment on 02/28/20 at 11:00 with Wyn Quaker, NP.  Nothing further needed at this time.

## 2020-02-26 ENCOUNTER — Telehealth: Payer: Self-pay | Admitting: Adult Health

## 2020-02-26 NOTE — Telephone Encounter (Signed)
Ok noted.  Antonio Cobb

## 2020-02-26 NOTE — Telephone Encounter (Signed)
Sleep study shows moderate OSA , will discuss in detail at [facility] with [facility] NP on 02/28/20 , results and treatment options

## 2020-02-27 ENCOUNTER — Other Ambulatory Visit: Payer: Self-pay

## 2020-02-27 ENCOUNTER — Ambulatory Visit (INDEPENDENT_AMBULATORY_CARE_PROVIDER_SITE_OTHER): Payer: BC Managed Care – PPO | Admitting: Cardiology

## 2020-02-27 ENCOUNTER — Encounter: Payer: Self-pay | Admitting: Cardiology

## 2020-02-27 VITALS — BP 140/86 | HR 74 | Temp 98.5°F | Ht 70.0 in | Wt 209.0 lb

## 2020-02-27 DIAGNOSIS — I5022 Chronic systolic (congestive) heart failure: Secondary | ICD-10-CM

## 2020-02-27 DIAGNOSIS — I10 Essential (primary) hypertension: Secondary | ICD-10-CM

## 2020-02-27 DIAGNOSIS — E782 Mixed hyperlipidemia: Secondary | ICD-10-CM

## 2020-02-27 DIAGNOSIS — I2510 Atherosclerotic heart disease of native coronary artery without angina pectoris: Secondary | ICD-10-CM

## 2020-02-27 MED ORDER — SPIRONOLACTONE 25 MG PO TABS: 12.5000 mg | ORAL_TABLET | Freq: Every day | ORAL | 3 refills | Status: DC

## 2020-02-27 NOTE — Progress Notes (Signed)
Clinical Summary Antonio Cobb is a 59 y.o.male seen today for follow up of the following medical problems.   1. CAD - hx of inferolateral STEMI June 2013, received DES to LCX. LVEF at that time 55%.  - echo 04/2016 LVEF 30-35% - 05/2016 cath patent LCX stent, moderate RCA and LAD disease.    - no recent chest pain. No SOB or DOE.   2. Chronic systolic HF - drop in LVEF noted by echo 04/2016, cath at that time did not show any new significant obstructive CAD - 09/2016 echo LVEF 30-35%, moderate MR.  - 10/2019 echo LVEF 25%   - ICD followed by Dr Lovena Le. 02/2020 normal function.    - entresto caused cough and dizzy spells, now just on losartan.  - 08/2017 echo LVEF 25-30%, grade II diastolic dysfunction.  - admit 08/2017 with fluid overload. Diuresed 6L, discharge weight 203 lbs.  - no recent SOB/DOE. No recent edema.  - home weights around stable around 200 - compliant with meds   - off aldactone during 01/2020 admission, had some low bp's - home weight 202 lbs.  - has multiple recent admissions with severe HTN and flash pulmonary edema starting in the middle of the night, thought to be associated with OSA. Has been referred to pulmonary  2. Hyperlipidemia - compliant with statin  3. HTN - compliant with meds  4. COPD - followed by pcp and Dr Luan Pulling -  06/2017 PFTs mild to moderate ventilaroty defect  5. OSA - followed by Dr Halford Chessman.   6. Mitral regurgitation - moderate by echo 08/2017 - no recent symptoms   Past Medical History:  Diagnosis Date  . Alcohol abuse   . CAD (coronary artery disease)    a. s/p inferolateral STEMI in 05/2012 with DES to LCx b. patent stent by cath in 05/2016 with moderate RCA and LAD disease  . Cardiomyopathy ([place])    a. LVEF 30-35% by echo in 09/2016. b. 08/2017: echo showing EF remains reduced at 25-30% c. s/p [employer] ICD placement in 01/2018  . CHF (congestive heart failure) ([place])   . COPD (chronic obstructive  pulmonary disease) ([place])   . Diabetes mellitus, type 2 ([place])   . Essential hypertension   . Mitral regurgitation    Moderate  . Mixed hyperlipidemia   . Myocardial infarction ([place]) 05/23/2012   Inferolateral STEMI  . Obesity      Allergies  Allergen Reactions  . Entresto [Sacubitril-Valsartan]     Dizziness, Coughing  . Other     Seasonal allergies  - has to use inhaler     Current Outpatient Medications  Medication Sig Dispense Refill  . albuterol (VENTOLIN HFA) 108 (90 Base) MCG/ACT inhaler Inhale 2 puffs into the lungs every 6 (six) hours as needed for wheezing or shortness of breath. 18 g 4  . aspirin EC 81 MG EC tablet Take 1 tablet (81 mg total) by mouth daily with breakfast. 30 tablet 3  . busPIRone (BUSPAR) 10 MG tablet Take 1 tablet (10 mg total) by mouth 3 (three) times daily. For anxiety 90 tablet 5  . docusate sodium (COLACE) 100 MG capsule Take 100 mg by mouth daily.    . famotidine (PEPCID) 20 MG tablet Take 1 tablet (20 mg total) by mouth at bedtime.    [name] glucose blood (FREESTYLE LITE) test strip TEST FOUR TIMES DAILY 100 each 3  . hydrOXYzine (ATARAX/VISTARIL) 25 MG tablet Take one tablet by mouth at bedtime,  for anxiety and sleep (Patient taking differently: Take 25 mg by mouth at bedtime as needed. Take one tablet by mouth at bedtime, for anxiety and sleep) 30 tablet 3  . insulin regular (NOVOLIN R,HUMULIN R) 100 units/mL injection Inject 0.1 mLs (10 Units total) into the skin 3 (three) times daily before meals. 10 mL 2  . ipratropium-albuterol (DUONEB) 0.5-2.5 (3) MG/3ML SOLN Take 3 mLs by nebulization every 6 (six) hours as needed. (Patient not taking: Reported on 02/21/2020) 360 mL 1  . LANTUS SOLOSTAR 100 UNIT/ML Solostar Pen ADMINISTER 50 UNITS UNDER THE SKIN EVERY NIGHT AT BEDTIME (Patient taking differently: Inject 50 Units into the skin every evening. ) 15 mL 2  . losartan (COZAAR) 25 MG tablet Take 1 tablet (25 mg total) by mouth daily. 30 tablet 5  .  Melatonin 10 MG TABS Take 2 tablets by mouth at bedtime.    . metoprolol succinate (TOPROL-XL) 50 MG 24 hr tablet TAKE 1 TABLET BY MOUTH EVERY DAY WITH FOOD (Patient taking differently: Take 50 mg by mouth daily. TAKE 1 TABLET BY MOUTH EVERY DAY WITH FOOD) 90 tablet 3  . Multiple Vitamins-Minerals (MULTIVITAMIN WITH MINERALS) tablet Take 1 tablet by mouth daily. 120 tablet 2  . nitroGLYCERIN (NITROSTAT) 0.4 MG SL tablet Place 1 tablet (0.4 mg total) under the tongue every 5 (five) minutes as needed for chest pain. 25 tablet 3  . pantoprazole (PROTONIX) 40 MG tablet Take 1 tablet (40 mg total) by mouth daily. Take 30-46min before first meal of the day. 30 tablet 1  . potassium chloride SA (KLOR-CON) 20 MEQ tablet Take 1 tablet (20 mEq total) by mouth daily. 90 tablet 3  . rosuvastatin (CRESTOR) 40 MG tablet Take 1 tablet (40 mg total) by mouth daily. 30 tablet 3  . sertraline (ZOLOFT) 25 MG tablet Take 1 tablet (25 mg total) by mouth daily. For anxiety/panic attacks 30 tablet 11  . torsemide (DEMADEX) 20 MG tablet Take 2 tablets (40 mg total) by mouth daily. 30 tablet 1   No current facility-administered medications for this visit.     Past Surgical History:  Procedure Laterality Date  . CARDIAC CATHETERIZATION  2 yrs ago  . CARDIAC CATHETERIZATION N/A 05/15/2016   Procedure: Right/Left Heart Cath and Coronary Angiography;  Surgeon: [name], MD;  Location: [facility];  Service: Cardiovascular;  Laterality: N/A;  . CORONARY STENT PLACEMENT  05/23/12  . ICD IMPLANT N/A 02/01/2018   Procedure: ICD IMPLANT;  Surgeon: Evans Lance, MD;  Location: [facility];  Service: Cardiovascular;  Laterality: N/A;  . LEFT HEART CATHETERIZATION WITH CORONARY ANGIOGRAM N/A 05/23/2012   Procedure: LEFT HEART CATHETERIZATION WITH CORONARY ANGIOGRAM;  Surgeon: [name], MD;  Location: [facility] CATH LAB;  Service: Cardiovascular;  Laterality: N/A;  . None    . PERCUTANEOUS CORONARY STENT  INTERVENTION (PCI-S) N/A 05/23/2012   Procedure: PERCUTANEOUS CORONARY STENT INTERVENTION (PCI-S);  Surgeon: [name], MD;  Location: [facility] CATH LAB;  Service: Cardiovascular;  Laterality: N/A;  . POLYPECTOMY  10/16/2011   Procedure: POLYPECTOMY;  Surgeon: [name], MD;  Location: [facility];  Service: Endoscopy;;  Polypoid Lesion, Transverse and Sigmoid Colon     Allergies  Allergen Reactions  . Entresto [Sacubitril-Valsartan]     Dizziness, Coughing  . Other     Seasonal allergies  - has to use inhaler      Family History  Problem Relation Age of Onset  . Emphysema Mother   .  Heart attack Mother   . Hypertension Mother   . Diabetes Mother   . Heart disease Mother   . COPD Mother   . Emphysema Father   . COPD Father   . Stroke Sister   . Asthma Sister   . Colon cancer Neg Hx   . Anesthesia problems Neg Hx   . Hypotension Neg Hx   . Malignant hyperthermia Neg Hx   . Pseudochol deficiency Neg Hx      Social History Antonio Cobb reports that he quit smoking about 3 years ago. His smoking use included cigars. He has a 7.50 pack-year smoking history. He has never used smokeless tobacco. Antonio Cobb reports current alcohol use.   Review of Systems CONSTITUTIONAL: No weight loss, fever, chills, weakness or fatigue.  HEENT: Eyes: No visual loss, blurred vision, double vision or yellow sclerae.No hearing loss, sneezing, congestion, runny nose or sore throat.  SKIN: No rash or itching.  CARDIOVASCULAR: per hpi RESPIRATORY: No shortness of breath, cough or sputum.  GASTROINTESTINAL: No anorexia, nausea, vomiting or diarrhea. No abdominal pain or blood.  GENITOURINARY: No burning on urination, no polyuria NEUROLOGICAL: No headache, dizziness, syncope, paralysis, ataxia, numbness or tingling in the extremities. No change in bowel or bladder control.  MUSCULOSKELETAL: No muscle, back pain, joint pain or stiffness.  LYMPHATICS: No enlarged nodes. No history of splenectomy.    PSYCHIATRIC: No history of depression or anxiety.  ENDOCRINOLOGIC: No reports of sweating, cold or heat intolerance. No polyuria or polydipsia.  [name]   Physical Examination Today's Vitals   02/27/20 1515  BP: 140/86  Pulse: 74  Temp: 98.5 F (36.9 C)  SpO2: 98%  Weight: 209 lb (94.8 kg)  Height: 5\' 10"  (1.778 m)   Body mass index is 29.99 kg/m.  Gen: resting comfortably, no acute distress HEENT: no scleral icterus, pupils equal round and reactive, no palptable cervical adenopathy,  CV: RRR, no m/r/g, no jvd Resp: Clear to auscultation bilaterally GI: abdomen is soft, non-tender, non-distended, normal bowel sounds, no hepatosplenomegaly MSK: extremities are warm, no edema.  Skin: warm, no rash Neuro:  no focal deficits Psych: appropriate affect   Diagnostic Studies  Cath 05/2012 [facility] FINDINGS  Hemodynamics:  AO 119/78  LV 121/71  Coronary angiography:  Coronary dominance: right  Left mainstem: The left main is patent with diffuse nonobstructive plaque. The distal left main has 30% stenosis.  Left anterior descending (LAD): the LAD is patent to the LV apex. The mid LAD has 50-60% stenosis at the origin of the second diagonal Lauralie Blacksher. The first diagonal is very large in caliber and has no significant obstructive disease.  Left circumflex (LCx): the left circumflex is totally occluded in the mid vessel. There is TIMI 0 flow. Following PCI, there were 2 obtuse marginals and a left posterolateral Jennefer Kopp visualized, all of which are patent.  Right coronary artery (RCA): there is a high anterior origin of the RCA. The mid vessel has diffuse plaque estimated at 50-60%. The vessel is dominant. There is diffuse nonobstructive disease throughout. There is a small PDA and small posterolateral Andrika Peraza.  Left ventriculography: Left ventricular systolic function is in the low-normal range, LVEF is estimated at 55%, there is hypokinesis of the basal and midinferior wall.  PCI  Note: Following the diagnostic procedure, the decision was made to proceed with PCI. The patient was loaded with Effient 60 mg. Weight-based bivalirudin was given for anticoagulation. Once a therapeutic ACT was achieved, a 6 [place] XB-LAD guide catheter was inserted.  A Cougar coronary guidewire was used to cross the lesion. The lesion was predilated with a 2.5x15 mm balloon. The lesion was then stented with a 3.5x15 mm Resolute drug-eluting stent. Following PCI, there was 0% residual stenosis and TIMI-3 flow. Final angiography confirmed an excellent result. The patient tolerated the procedure well. There were no immediate procedural complications. A TR band was used for radial hemostasis. The patient was transferred to the post catheterization recovery area for further monitoring.  PCI Data:  Vessel - circumflex /Segment - mid  Percent Stenosis (pre) 100  TIMI-flow 0  Stent 3.5 x 15 mm resolute integrity DES  Percent Stenosis (post) 0  TIMI-flow (post) 3  Final Conclusions:  1. Total occlusion of the left circumflex with successful primary PCI using a drug-eluting stent platform  2. Diffuse nonobstructive disease of the right coronary artery and LAD  3. Mild left ventricular dysfunction consistent with inferior MI  Recommendations:  transfer to ICU. Post MI medical therapy will be instituted. Tobacco cessation counseling will be done. Anticipate 48 hour hospitalization if his post MI course is uncomplicated.  09/2016 echo Study Conclusions  - Left ventricle: The cavity size was mildly dilated. Wall thickness was increased in a pattern of mild LVH. Systolic function was moderately to severely reduced. The estimated ejection fraction was in the range of 30% to 35%. There is akinesis of the inferolateral and inferior myocardium. Doppler parameters are consistent with restrictive physiology, indicative of decreased left ventricular diastolic compliance  and/or increased left atrial pressure. - Aortic valve: Mildly calcified annulus. Trileaflet. There was trivial regurgitation. - Mitral valve: Mildly thickened leaflets . There was moderate regurgitation directed posteriorly. - Left atrium: The atrium was mildly dilated. - Right atrium: Central venous pressure (est): 3 mm Hg. - Tricuspid valve: There was mild regurgitation. - Pulmonary arteries: PA peak pressure: 36 mm Hg (S). - Pericardium, extracardiac: There was no pericardial effusion.  Impressions:  - Mild LVH with mild LV chamber dilatation and LVEF 30-35%. Inferior/inferolateral akinesis. Restrictive diastolic filling pattern noted. Mild left atrial enlargement. Mildly thickened mitral leaflets with moderate, posteriorly directed mitral regurgitation. Mildly calcified aortic annulus with trivial aortic regurgitation. Mild tricuspid regurgitation with PASP 36 mmHg.  05/2016 cath  Patent stent in the mid circumflex.  Moderate disease in the RCA and LAD.  Mild pulmonary hypertension.  Cardiac output 5.1 L/m. Cardiac index 2.4  Pulmonary artery saturation 66%.  Cardiomyopathy not related to obstructive coronary artery disease. The patient did state that he drinks alcohol heavily. I did explain him the cutting back on his alcohol may help his cardiac function. Follow-up with Dr. Harl Bowie.   06/2017 PFTs Mild to moderate ventilatory defect with small airway obstruction, reduced TLC, normal DLCO  08/2017 echo Study Conclusions  - Left ventricle: The cavity size was moderately dilated. Wall thickness was increased in a pattern of mild LVH. Systolic function was severely reduced. The estimated ejection fraction was in the range of 25% to 30%. Diffuse hypokinesis. Features are consistent with a pseudonormal left ventricular filling pattern, with concomitant abnormal relaxation and increased filling pressure (grade 2 diastolic dysfunction).  Doppler parameters are consistent with high ventricular filling pressure. - Regional wall motion abnormality: Akinesis of the mid inferior, basal-mid inferolateral, and apical lateral myocardium; severe hypokinesis of the basal inferior myocardium; moderate hypokinesis of the mid anterolateral myocardium. - Aortic valve: There was trivial regurgitation. - Aorta: Mild aortic root dilatation. - Mitral valve: Calcified annulus. Mildly thickened leaflets . There was moderate, posteriorly directed  regurgitation. - Left atrium: The atrium was severely dilated. - Atrial septum: The septum bowed from left to right, consistent with increased left atrial pressure. - Tricuspid valve: There was mild regurgitation. - Pulmonary arteries: PA peak pressure: 34 mm Hg (S).   10/2019 echo IMPRESSIONS    1. Left ventricular ejection fraction, by visual estimation, is 25%. The  left ventricle has severely decreased function. There is mildly increased  left ventricular hypertrophy.  2. Multiple segmental abnormalities exist. See findings.  3. Left ventricular diastolic parameters are indeterminate.  4. Moderately dilated left ventricular internal cavity size.  5. Global right ventricle has normal systolic function.The right  ventricular size is normal. No increase in right ventricular wall  thickness.  6. Left atrial size was normal.  7. Right atrial size was normal.  8. Presence of pericardial fat pad.  9. Mild to moderate aortic valve annular calcification.  10. The mitral valve is grossly normal. Mild mitral valve regurgitation.  11. The tricuspid valve is grossly normal. Tricuspid valve regurgitation  is trivial.  12. The aortic valve is tricuspid. Aortic valve regurgitation is trivial.  13. The pulmonic valve was grossly normal. Pulmonic valve regurgitation is  not visualized.  14. TR signal is inadequate for assessing pulmonary artery systolic  pressure.  15. A pacer  wire is visualized.  16. The inferior vena cava is normal in size with greater than 50%  respiratory variability, suggesting right atrial pressure of 3 mmHg.    Assessment and Plan  1. CAD - no symptoms, continue current meds  2. Chronic systolic HF - doing well, continue current meds - restart aldactone 12.5mg  daily  3. . Hyperlipidemia -continue statin  4. HTN -- above goal, restart aldactone at 12.5mg  daily.     F/u 3 months      Arnoldo Lenis, M.D.

## 2020-02-27 NOTE — Progress Notes (Signed)
@Patient  ID: Antonio Cobb, male    DOB: [birth year], 60 y.o.   MRN: [id number]  Chief Complaint  Patient presents with  . Follow-up    F/U to discuss sleep study results    Referring provider: [name], MD  HPI:  59 year old male former smoker followed in our office for obstructive sleep apnea  PMH: ETOH, CAD, ischemic CM, COPD, DM, HTN, HLD Smoker/ Smoking History: Former smoker. Quit 2017. Maintenance:  None  Pt of: Dr. Halford Chessman for sleep, Dr. Melvyn Novas for pulm   02/28/2020  - Visit   59 year old male former smoker followed in our office for moderate obstructive sleep apnea.  Patient recently completed a sleep study confirming this. Patient presents today to discuss treatment options. Patient continues to follow-up with cardiology and primary care. He reports that his weights have been stable. He is weighing himself daily. He reports adherence to his diuretics.  Questionaires / Pulmonary Flowsheets:   Epworth:  Results of the Epworth flowsheet 02/21/2020 02/09/2020  Sitting and reading 2 2  Watching TV 3 3  Sitting, inactive in a public place (e.g. a theatre or a meeting) 3 3  As a passenger in a car for an hour without a break 1 3  Lying down to rest in the afternoon when circumstances permit 3 2  Sitting and talking to someone 0 2  Sitting quietly after a lunch without alcohol 3 3  In a car, while stopped for a few minutes in traffic 0 3  Total score 15 21    Tests:   Pulmonary tests:  PFT 06/29/17 >> FEV1 2.31 (72%), FEV1% 82, TLC 4.56 (65%), DLCO 71%  Sleep tests:   PSG 02/21/20 >> AHI 18.9, SpO2 low 81%. Cardiac tests:  Echo 11/13/19 >> EF 25%, mild LVH  FENO:  No results found for: NITRICOXIDE  PFT: PFT Results Latest Ref Rng & Units 06/29/2017  FVC-Pre L 2.63  FVC-Predicted Pre % 64  FVC-Post L 2.81  FVC-Predicted Post % 69  Pre FEV1/FVC % % 80  Post FEV1/FCV % % 82  FEV1-Pre L 2.11  FEV1-Predicted Pre % 66  FEV1-Post L 2.31  DLCO UNC% % 71    DLCO COR %Predicted % 97  TLC L 4.56  TLC % Predicted % 65  RV % Predicted % 87    WALK:  No flowsheet data found.  Imaging: CUP PACEART REMOTE DEVICE CHECK  Result Date: 02/12/2020 Scheduled remote reviewed.  Normal device function.  NSVT episodes noted Next remote 91 days.   Lab Results:  CBC    Component Value Date/Time   WBC 13.0 (H) 01/21/2020 0615   RBC 5.42 01/21/2020 0615   HGB 15.4 01/21/2020 0615   HCT 49.9 01/21/2020 0615   PLT 282 01/21/2020 0615   MCV 92.1 01/21/2020 0615   MCH 28.4 01/21/2020 0615   MCHC 30.9 01/21/2020 0615   RDW 12.2 01/21/2020 0615   LYMPHSABS 5.0 (H) 01/21/2020 0615   MONOABS 0.9 01/21/2020 0615   EOSABS 1.5 (H) 01/21/2020 0615   BASOSABS 0.2 (H) 01/21/2020 0615    BMET    Component Value Date/Time   NA 138 01/25/2020 0514   K 3.9 01/25/2020 0514   CL 105 01/25/2020 0514   CO2 25 01/25/2020 0514   GLUCOSE 134 (H) 01/25/2020 0514   BUN 31 (H) 01/25/2020 0514   CREATININE 1.30 (H) 01/25/2020 0514   CREATININE 1.85 (H) 11/30/2019 1550   CALCIUM 8.9 01/25/2020 0514   GFRNONAA 60 (  L) 01/25/2020 0514   GFRNONAA 39 (L) 11/30/2019 1550   GFRAA >60 01/25/2020 0514   GFRAA 45 (L) 11/30/2019 1550    BNP    Component Value Date/Time   BNP 300.0 (H) 01/21/2020 0615    ProBNP No results found for: PROBNP  Specialty Problems      Pulmonary Problems   Allergic rhinitis   Acute respiratory failure with hypoxia and hypercapnia (HCC)   Upper airway cough syndrome    Chronic cough and wheeze in pt with lonngstanding nicotine dependence, needs to commit to daily spiriva and symbicort  and only use albuterol for rescue. He is to be formally evaluated by pulmonary Doc also, he is referred      Pulmonary edema   Flash pulmonary edema (HCC)   Dyspnea   OSA (obstructive sleep apnea)       Allergies  Allergen Reactions  . Entresto [Sacubitril-Valsartan]     Dizziness, Coughing  . Other     Seasonal allergies  - has to use  inhaler    Immunization History  Administered Date(s) Administered  . Influenza Split 11/11/2011, 09/07/2012  . Influenza,inj,Quad PF,6+ Mos 03/12/2016, 10/08/2016, 09/14/2017, 01/30/2019, 01/30/2019, 10/11/2019  . Moderna SARS-COVID-2 Vaccination 02/11/2020  . Pneumococcal Polysaccharide-23 08/13/2011, 10/08/2016  . Td 07/01/2004  . Tdap 07/05/2014    Past Medical History:  Diagnosis Date  . Alcohol abuse   . CAD (coronary artery disease)    a. s/p inferolateral STEMI in 05/2012 with DES to LCx b. patent stent by cath in 05/2016 with moderate RCA and LAD disease  . Cardiomyopathy ([place])    a. LVEF 30-35% by echo in 09/2016. b. 08/2017: echo showing EF remains reduced at 25-30% c. s/p [employer] ICD placement in 01/2018  . CHF (congestive heart failure) ([place])   . COPD (chronic obstructive pulmonary disease) ([place])   . Diabetes mellitus, type 2 ([place])   . Essential hypertension   . Mitral regurgitation    Moderate  . Mixed hyperlipidemia   . Myocardial infarction ([place]) 05/23/2012   Inferolateral STEMI  . Obesity     Tobacco History: Social History   Tobacco Use  Smoking Status Former Smoker  . Packs/day: 0.25  . Years: 30.00  . Pack years: 7.50  . Types: Cigars  . Quit date: 05/23/2016  . Years since quitting: 3.7  Smokeless Tobacco Never Used  Tobacco Comment   since early 64s   Counseling given: Yes Comment: since early 23s   Continue to not smoke  Outpatient Encounter Medications as of 02/28/2020  Medication Sig  . albuterol (VENTOLIN HFA) 108 (90 Base) MCG/ACT inhaler Inhale 2 puffs into the lungs every 6 (six) hours as needed for wheezing or shortness of breath.  [name] aspirin EC 81 MG EC tablet Take 1 tablet (81 mg total) by mouth daily with breakfast.  . busPIRone (BUSPAR) 10 MG tablet Take 1 tablet (10 mg total) by mouth 3 (three) times daily. For anxiety  . docusate sodium (COLACE) 100 MG capsule Take 100 mg by mouth daily.  . famotidine (PEPCID) 20 MG  tablet Take 1 tablet (20 mg total) by mouth at bedtime.  [name] glucose blood (FREESTYLE LITE) test strip TEST FOUR TIMES DAILY  . hydrOXYzine (ATARAX/VISTARIL) 25 MG tablet Take one tablet by mouth at bedtime, for anxiety and sleep (Patient taking differently: Take 25 mg by mouth at bedtime as needed. Take one tablet by mouth at bedtime, for anxiety and sleep)  . insulin regular (NOVOLIN R,HUMULIN R)  100 units/mL injection Inject 0.1 mLs (10 Units total) into the skin 3 (three) times daily before meals.  [name] LANTUS SOLOSTAR 100 UNIT/ML Solostar Pen ADMINISTER 50 UNITS UNDER THE SKIN EVERY NIGHT AT BEDTIME (Patient taking differently: Inject 50 Units into the skin every evening. )  . losartan (COZAAR) 25 MG tablet Take 1 tablet (25 mg total) by mouth daily.  . Melatonin 10 MG TABS Take 2 tablets by mouth at bedtime.  . metoprolol succinate (TOPROL-XL) 50 MG 24 hr tablet TAKE 1 TABLET BY MOUTH EVERY DAY WITH FOOD (Patient taking differently: Take 50 mg by mouth daily. TAKE 1 TABLET BY MOUTH EVERY DAY WITH FOOD)  . Multiple Vitamins-Minerals (MULTIVITAMIN WITH MINERALS) tablet Take 1 tablet by mouth daily.  . pantoprazole (PROTONIX) 40 MG tablet Take 1 tablet (40 mg total) by mouth daily. Take 30-69min before first meal of the day.  . potassium chloride SA (KLOR-CON) 20 MEQ tablet Take 1 tablet (20 mEq total) by mouth daily.  . rosuvastatin (CRESTOR) 40 MG tablet Take 1 tablet (40 mg total) by mouth daily.  . sertraline (ZOLOFT) 25 MG tablet Take 1 tablet (25 mg total) by mouth daily. For anxiety/panic attacks  . spironolactone (ALDACTONE) 25 MG tablet Take 0.5 tablets (12.5 mg total) by mouth daily.  [name] torsemide (DEMADEX) 20 MG tablet Take 2 tablets (40 mg total) by mouth daily.  . nitroGLYCERIN (NITROSTAT) 0.4 MG SL tablet Place 1 tablet (0.4 mg total) under the tongue every 5 (five) minutes as needed for chest pain.   No facility-administered encounter medications on file as of 02/28/2020.     Review  of Systems  Review of Systems  Constitutional: Negative for activity change, chills, fatigue, fever and unexpected weight change.  HENT: Negative for postnasal drip, rhinorrhea, sinus pressure, sinus pain and sore throat.   Eyes: Negative.   Respiratory: Negative for cough, shortness of breath and wheezing.   Cardiovascular: Negative for chest pain and palpitations.  Gastrointestinal: Negative for constipation, diarrhea, nausea and vomiting.  Endocrine: Negative.   Genitourinary: Negative.   Musculoskeletal: Negative.   Skin: Negative.   Neurological: Negative for dizziness and headaches.  Psychiatric/Behavioral: Negative.  Negative for dysphoric mood. The patient is not nervous/anxious.   All other systems reviewed and are negative.    Physical Exam  BP 128/80 (BP Location: Left Arm, Patient Position: Sitting, Cuff Size: Large)   Pulse 67   Temp (!) 97.1 F (36.2 C) (Temporal)   Ht 5\' 10"  (1.778 m)   Wt 206 lb 12.8 oz (93.8 kg)   SpO2 99% Comment: on RA  BMI 29.67 kg/m   Wt Readings from Last 5 Encounters:  02/28/20 206 lb 12.8 oz (93.8 kg)  02/27/20 209 lb (94.8 kg)  02/21/20 201 lb 12.8 oz (91.5 kg)  02/09/20 208 lb (94.3 kg)  01/25/20 200 lb 1.6 oz (90.8 kg)    BMI Readings from Last 5 Encounters:  02/28/20 29.67 kg/m  02/27/20 29.99 kg/m  02/21/20 28.96 kg/m  02/09/20 29.84 kg/m  01/25/20 28.71 kg/m     Physical Exam Vitals and nursing note reviewed.  Constitutional:      General: He is not in acute distress.    Appearance: Normal appearance. He is normal weight.  HENT:     Head: Normocephalic and atraumatic.     Right Ear: Hearing and external ear normal.     Left Ear: Hearing and external ear normal.     Nose: No mucosal edema.  Right Turbinates: Not enlarged.     Left Turbinates: Not enlarged.  Cardiovascular:     Rate and Rhythm: Normal rate and regular rhythm.     Pulses: Normal pulses.     Heart sounds: Normal heart sounds. No murmur.    Pulmonary:     Effort: Pulmonary effort is normal.     Breath sounds: Normal breath sounds. No decreased breath sounds, wheezing or rales.  Musculoskeletal:     Cervical back: Normal range of motion.     Right lower leg: No edema.     Left lower leg: No edema.  Lymphadenopathy:     Cervical: No cervical adenopathy.  Skin:    General: Skin is warm and dry.     Capillary Refill: Capillary refill takes less than 2 seconds.     Findings: No erythema or rash.  Neurological:     General: No focal deficit present.     Mental Status: He is alert and oriented to person, place, and time.     Motor: No weakness.     Coordination: Coordination normal.     Gait: Gait is intact. Gait normal.  Psychiatric:        Mood and Affect: Mood normal.        Behavior: Behavior normal. Behavior is cooperative.        Thought Content: Thought content normal.        Judgment: Judgment normal.       Assessment & Plan:   OSA (obstructive sleep apnea)  Discussed pathophysiology of obstructive sleep apnea Discussed treatment options  Plan: CPAP start APAP setting 5-15 DME: [employer] of choice We will need 64-month follow-up in [place] [facility]  Chronic systolic (congestive) heart failure ([place]) Emphasized importance of monitoring for fluid retention and taking diuretics  Plan: Continue to weigh yourself daily Continue diuretics Continue follow-up with primary care Continue follow-up with cardiology    Return in about 2 months (around 04/29/2020), or if symptoms worsen or fail to improve, for Follow up with Dr. Halford Chessman, [facility].   Lauraine Rinne, NP 02/28/2020   This appointment required 26 minutes of patient care (this includes precharting, chart review, review of results, face-to-face care, etc.).

## 2020-02-27 NOTE — Patient Instructions (Signed)
Medication Instructions:  START ALDACTONE 12.5 MG DAILY   Labwork: NONE  Testing/Procedures: NONE  Follow-Up: Your physician recommends that you schedule a follow-up appointment in: 3 MONTHS   Any Other Special Instructions Will Be Listed Below (If Applicable).     If you need a refill on your cardiac medications before your next appointment, please call your pharmacy.

## 2020-02-28 ENCOUNTER — Ambulatory Visit (INDEPENDENT_AMBULATORY_CARE_PROVIDER_SITE_OTHER): Payer: BC Managed Care – PPO | Admitting: Pulmonary Disease

## 2020-02-28 ENCOUNTER — Encounter: Payer: Self-pay | Admitting: Pulmonary Disease

## 2020-02-28 VITALS — BP 128/80 | HR 67 | Temp 97.1°F | Ht 70.0 in | Wt 206.8 lb

## 2020-02-28 DIAGNOSIS — I5022 Chronic systolic (congestive) heart failure: Secondary | ICD-10-CM

## 2020-02-28 DIAGNOSIS — G4733 Obstructive sleep apnea (adult) (pediatric): Secondary | ICD-10-CM

## 2020-02-28 NOTE — Patient Instructions (Addendum)
You were seen today by Antonio Rinne, NP  for:   Pleasure meeting you today.  We will get you started on CPAP therapy for management of your obstructive sleep apnea.  Please call us if you have any concerns or questions.  Keep follow-up with cardiology and primary care.  We will see you back in about 2 months.  Take care of yourself and stay safe,   Antonio Cobb  1. OSA (obstructive sleep apnea)   New CPAP start DME: Antonio Cobb APAP setting 5-15 Mask of choice Supplies  We recommend that you start using your CPAP daily >>>Keep up the hard work using your device >>> Goal should be wearing this for the entire night that you are sleeping, at least 4 to 6 hours  Remember:  . Do not drive or operate heavy machinery if tired or drowsy.  . Please notify the supply company and office if you are unable to use your device regularly due to missing supplies or machine being broken.  . Work on maintaining a healthy weight and following your recommended nutrition plan  . Maintain proper daily exercise and movement  . Maintaining proper use of your device can also help improve management of other chronic illnesses such as: Blood pressure, blood sugars, and weight management.   BiPAP/ CPAP Cleaning:  >>>Clean weekly, with Dawn soap, and bottle brush.  Set up to air dry. >>> Wipe mask out daily with wet wipe or towelette    2. Chronic systolic (congestive) heart failure (HCC)  Keep follow-up with cardiology  Continue to weigh yourself daily  Continue fluid pills    Follow Up:    Return in about 2 months (around 04/29/2020), or if symptoms worsen or fail to improve, for Follow up with Antonio Cobb, [facility].   Please do your part to reduce the spread of COVID-19:      Reduce your risk of any infection  and COVID19 by using the similar precautions used for avoiding the common cold or flu:  [name] Wash your hands often with soap and warm water for at least 20 seconds.  If soap  and water are not readily available, use an alcohol-based hand sanitizer with at least 60% alcohol.  . If coughing or sneezing, cover your mouth and nose by coughing or sneezing into the elbow areas of your shirt or coat, into a tissue or into your sleeve (not your hands). Antonio Cobb A MASK when in public  . Avoid shaking hands with others and consider head nods or verbal greetings only. . Avoid touching your eyes, nose, or mouth with unwashed hands.  . Avoid close contact with people who are sick. . Avoid places or events with large numbers of people in one location, like concerts or sporting events. . If you have some symptoms but not all symptoms, continue to monitor at home and seek medical attention if your symptoms worsen. . If you are having a medical emergency, call 911.   [place] / e-Visit: [email or web address]         [facility]: [facility]: [id number]                   [facility]: [id number]     It is flu season:   >>> Best ways to protect herself from the flu: Receive the yearly flu vaccine, practice good hand hygiene washing with soap and also  using hand sanitizer when available, eat a nutritious meals, get adequate rest, hydrate appropriately   Please contact the office if your symptoms worsen or you have concerns that you are not improving.   Thank you for choosing [facility] for your healthcare, and for allowing [place] to partner with you on your healthcare journey. I am thankful to be able to provide care to you today.   Antonio Quaker FNP-C

## 2020-02-28 NOTE — Assessment & Plan Note (Signed)
Emphasized importance of monitoring for fluid retention and taking diuretics  Plan: Continue to weigh yourself daily Continue diuretics Continue follow-up with primary care Continue follow-up with cardiology

## 2020-02-28 NOTE — Assessment & Plan Note (Signed)
Discussed pathophysiology of obstructive sleep apnea Discussed treatment options  Plan: CPAP start APAP setting 5-15 DME: Pennie Banter of choice We will need 70-month follow-up in [place] [facility]

## 2020-02-29 NOTE — Progress Notes (Signed)
Reviewed and agree with assessment/plan.   Shalicia Craghead, MD [facility] 12/09/2016, 12:24 PM Pager:  [phone]  

## 2020-03-16 ENCOUNTER — Ambulatory Visit: Payer: BC Managed Care – PPO | Attending: Internal Medicine

## 2020-03-16 DIAGNOSIS — Z23 Encounter for immunization: Secondary | ICD-10-CM

## 2020-03-16 NOTE — Progress Notes (Signed)
   Covid-19 Vaccination Clinic  Name:  Antonio Cobb    MRN: [id number] DOB: 02/12/1961  03/16/2020  Mr. Done was observed post Covid-19 immunization for 15 minutes without incident. He was provided with Vaccine Information Sheet and instruction to access the V-Safe system.   [name] was instructed to call 911 with any severe reactions post vaccine: [name] Difficulty breathing  . Swelling of face and throat  . A fast heartbeat  . A bad rash all over body  . Dizziness and weakness   Immunizations Administered    Name Date Dose VIS Date Route   Moderna COVID-19 Vaccine 03/16/2020  2:18 PM 0.5 mL 11/14/2019 Intramuscular   Manufacturer: Moderna   Lot: [id number]   [id number]

## 2020-03-26 ENCOUNTER — Other Ambulatory Visit: Payer: Self-pay | Admitting: Internal Medicine

## 2020-03-26 MED ORDER — PANTOPRAZOLE SODIUM 40 MG PO TBEC: 40.0000 mg | DELAYED_RELEASE_TABLET | Freq: Every day | ORAL | 1 refills | Status: DC

## 2020-03-27 ENCOUNTER — Other Ambulatory Visit: Payer: Self-pay

## 2020-03-27 ENCOUNTER — Ambulatory Visit (INDEPENDENT_AMBULATORY_CARE_PROVIDER_SITE_OTHER): Payer: BC Managed Care – PPO | Admitting: Family Medicine

## 2020-03-27 ENCOUNTER — Ambulatory Visit (HOSPITAL_COMMUNITY)
Admission: RE | Admit: 2020-03-27 | Discharge: 2020-03-27 | Disposition: A | Discharge status: Home / Self Care | Payer: BC Managed Care – PPO | Source: Ambulatory Visit | Attending: Family Medicine | Admitting: Family Medicine

## 2020-03-27 ENCOUNTER — Encounter: Payer: Self-pay | Admitting: Family Medicine

## 2020-03-27 VITALS — BP 128/80 | Temp 96.5°F | Ht 70.0 in | Wt 206.0 lb

## 2020-03-27 DIAGNOSIS — R05 Cough: Secondary | ICD-10-CM

## 2020-03-27 DIAGNOSIS — R059 Cough, unspecified: Secondary | ICD-10-CM

## 2020-03-27 DIAGNOSIS — R058 Other specified cough: Secondary | ICD-10-CM

## 2020-03-27 DIAGNOSIS — I5022 Chronic systolic (congestive) heart failure: Secondary | ICD-10-CM | POA: Insufficient documentation

## 2020-03-27 DIAGNOSIS — I509 Heart failure, unspecified: Secondary | ICD-10-CM | POA: Diagnosis not present

## 2020-03-27 DIAGNOSIS — J3089 Other allergic rhinitis: Secondary | ICD-10-CM | POA: Diagnosis not present

## 2020-03-27 MED ORDER — LOSARTAN POTASSIUM 25 MG PO TABS: 25.0000 mg | ORAL_TABLET | Freq: Every day | ORAL | 5 refills | Status: DC

## 2020-03-27 MED ORDER — GUAIFENESIN ER 600 MG PO TB12: 600.0000 mg | ORAL_TABLET | Freq: Two times a day (BID) | ORAL | 1 refills | Status: AC

## 2020-03-27 MED ORDER — LORATADINE 10 MG PO TABS: 10.0000 mg | ORAL_TABLET | Freq: Every day | ORAL | 2 refills | Status: DC

## 2020-03-27 MED ORDER — CROMOLYN SODIUM 5.2 MG/ACT NA AERS: 1.0000 | INHALATION_SPRAY | Freq: Four times a day (QID) | NASAL | 1 refills | Status: DC

## 2020-03-27 NOTE — Patient Instructions (Addendum)
I appreciate the opportunity to provide you with care for your health and wellness. Today we discussed: cough and wheeze  Follow up: as scheduled  No labs or referrals today  Xray today  Start allergy medications  Please continue to practice social distancing to keep you, your family, and our community safe.  If you must go out, please wear a mask and practice good handwashing.  It was a pleasure to see you and I look forward to continuing to work together on your health and well-being. Please do not hesitate to call the office if you need care or have questions about your care.  Have a wonderful day and week. With Gratitude, Cherly Beach, DNP, AGNP-BC

## 2020-03-27 NOTE — Progress Notes (Signed)
Virtual Visit via Telephone Note   This visit type was conducted due to national recommendations for restrictions regarding the COVID-19 Pandemic (e.g. social distancing) in an effort to limit this patient's exposure and mitigate transmission in our community.  Due to his co-morbid illnesses, this patient is at least at moderate risk for complications without adequate follow up.  This format is felt to be most appropriate for this patient at this time.  The patient did not have access to video technology/had technical difficulties with video requiring transitioning to audio format only (telephone).  All issues noted in this document were discussed and addressed.  No physical exam could be performed with this format.    Evaluation Performed:  Follow-up visit  Date:  03/27/2020   ID:  Antonio Cobb, DOB [birth year], MRN [id number]  Patient Location: Home Provider Location: Office  Location of Patient: Home Location of Provider: Telehealth Consent was obtain for visit to be over via telehealth. I verified that I am speaking with the correct person using two identifiers.  PCP:  [name], MD   Chief Complaint:  Wheezing   History of Present Illness:    Antonio Cobb is a 59 y.o. male with wheezing all night and coughing up brown mucus, woke up at 3am and couldn't go back to sleep. Was so tired and couldn't hardly get dressed without giving out of breath. Said symptoms felt like CHF but he feels ok now, just feels a little chilled, denies fever. No coughing or wheezing anymore unless he starts moving around or walking right much he still is giving out. Lower extremities have not been swollen but did notice his hands looked a little puffy but nothing very noticable. Denies weight changes. Reports having some issues with allergy like symptoms during this time of the year, is not taking anything for allergies. Denies sick or COVID + contacts.  The patient does not have  symptoms concerning for COVID-19 infection (fever, chills, cough, or new shortness of breath).   Past Medical, Surgical, Social History, Allergies, and Medications have been Reviewed.  Past Medical History:  Diagnosis Date  . Alcohol abuse   . CAD (coronary artery disease)    a. s/p inferolateral STEMI in 05/2012 with DES to LCx b. patent stent by cath in 05/2016 with moderate RCA and LAD disease  . Cardiomyopathy ([place])    a. LVEF 30-35% by echo in 09/2016. b. 08/2017: echo showing EF remains reduced at 25-30% c. s/p [employer] ICD placement in 01/2018  . CHF (congestive heart failure) ([place])   . COPD (chronic obstructive pulmonary disease) ([place])   . Diabetes mellitus, type 2 ([place])   . Essential hypertension   . Mitral regurgitation    Moderate  . Mixed hyperlipidemia   . Myocardial infarction ([place]) 05/23/2012   Inferolateral STEMI  . Obesity    Past Surgical History:  Procedure Laterality Date  . CARDIAC CATHETERIZATION  2 yrs ago  . CARDIAC CATHETERIZATION N/A 05/15/2016   Procedure: Right/Left Heart Cath and Coronary Angiography;  Surgeon: [name], MD;  Location: [facility];  Service: Cardiovascular;  Laterality: N/A;  . CORONARY STENT PLACEMENT  05/23/12  . ICD IMPLANT N/A 02/01/2018   Procedure: ICD IMPLANT;  Surgeon: Evans Lance, MD;  Location: [facility];  Service: Cardiovascular;  Laterality: N/A;  . LEFT HEART CATHETERIZATION WITH CORONARY ANGIOGRAM N/A 05/23/2012   Procedure: LEFT HEART CATHETERIZATION WITH CORONARY ANGIOGRAM;  Surgeon: Legrand Como  Burt Knack, MD;  Location: [facility] C[facility] LAB;  Service: Cardiovascular;  Laterality: N/A;  . None    . PERCUTANEOUS CORONARY STENT INTERVENTION (PCI-S) N/A 05/23/2012   Procedure: PERCUTANEOUS CORONARY STENT INTERVENTION (PCI-S);  Surgeon: [name], MD;  Location: [facility] CATH LAB;  Service: Cardiovascular;  Laterality: N/A;  . POLYPECTOMY  10/16/2011   Procedure: POLYPECTOMY;  Surgeon: [name], MD;   Location: [facility];  Service: Endoscopy;;  Polypoid Lesion, Transverse and Sigmoid Colon     Current Meds  Medication Sig  . albuterol (VENTOLIN HFA) 108 (90 Base) MCG/ACT inhaler Inhale 2 puffs into the lungs every 6 (six) hours as needed for wheezing or shortness of breath.  [name] aspirin EC 81 MG EC tablet Take 1 tablet (81 mg total) by mouth daily with breakfast.  . busPIRone (BUSPAR) 10 MG tablet Take 1 tablet (10 mg total) by mouth 3 (three) times daily. For anxiety  . docusate sodium (COLACE) 100 MG capsule Take 100 mg by mouth daily.  . famotidine (PEPCID) 20 MG tablet Take 1 tablet (20 mg total) by mouth at bedtime.  [name] glucose blood (FREESTYLE LITE) test strip TEST FOUR TIMES DAILY  . hydrOXYzine (ATARAX/VISTARIL) 25 MG tablet Take one tablet by mouth at bedtime, for anxiety and sleep (Patient taking differently: Take 25 mg by mouth at bedtime as needed. Take one tablet by mouth at bedtime, for anxiety and sleep)  . insulin regular (NOVOLIN R,HUMULIN R) 100 units/mL injection Inject 0.1 mLs (10 Units total) into the skin 3 (three) times daily before meals.  [name] LANTUS SOLOSTAR 100 UNIT/ML Solostar Pen ADMINISTER 50 UNITS UNDER THE SKIN EVERY NIGHT AT BEDTIME (Patient taking differently: Inject 50 Units into the skin every evening. )  . losartan (COZAAR) 25 MG tablet Take 1 tablet (25 mg total) by mouth daily.  . Melatonin 10 MG TABS Take 2 tablets by mouth at bedtime.  . metoprolol succinate (TOPROL-XL) 50 MG 24 hr tablet TAKE 1 TABLET BY MOUTH EVERY DAY WITH FOOD (Patient taking differently: Take 50 mg by mouth daily. TAKE 1 TABLET BY MOUTH EVERY DAY WITH FOOD)  . Multiple Vitamins-Minerals (MULTIVITAMIN WITH MINERALS) tablet Take 1 tablet by mouth daily.  . pantoprazole (PROTONIX) 40 MG tablet Take 1 tablet (40 mg total) by mouth daily. Take 30-17min before first meal of the day.  . potassium chloride SA (KLOR-CON) 20 MEQ tablet Take 1 tablet (20 mEq total) by mouth daily.  . rosuvastatin  (CRESTOR) 40 MG tablet Take 1 tablet (40 mg total) by mouth daily.  . sertraline (ZOLOFT) 25 MG tablet Take 1 tablet (25 mg total) by mouth daily. For anxiety/panic attacks  . spironolactone (ALDACTONE) 25 MG tablet Take 0.5 tablets (12.5 mg total) by mouth daily.  [name] torsemide (DEMADEX) 20 MG tablet Take 2 tablets (40 mg total) by mouth daily.  . [DISCONTINUED] losartan (COZAAR) 25 MG tablet Take 1 tablet (25 mg total) by mouth daily.     Allergies:   Entresto [sacubitril-valsartan] and Other   ROS:   Please see the history of present illness.    All other systems reviewed and are negative.   Labs/Other Tests and Data Reviewed:    Recent Labs: 11/13/2019: TSH 1.333 01/21/2020: ALT 27; B Natriuretic Peptide 300.0; Hemoglobin 15.4; Platelets 282 01/23/2020: Magnesium 2.2 01/25/2020: BUN 31; Creatinine, Ser 1.30; Potassium 3.9; Sodium 138   Recent Lipid Panel Lab Results  Component Value Date/Time   CHOL 161 10/23/2019 09:55 AM   TRIG 289 (H) 10/23/2019  09:55 AM   HDL 41 10/23/2019 09:55 AM   CHOLHDL 3.9 10/23/2019 09:55 AM   LDLCALC 82 10/23/2019 09:55 AM   LDLDIRECT TEST NOT PERFORMED 06/19/2011 12:30 PM    Wt Readings from Last 3 Encounters:  03/27/20 206 lb (93.4 kg)  02/28/20 206 lb 12.8 oz (93.8 kg)  02/27/20 209 lb (94.8 kg)     Objective:    Vital Signs:  BP 128/80   Temp (!) 96.5 F (35.8 C) (Oral)   Ht 5\' 10"  (1.778 m)   Wt 206 lb (93.4 kg)   BMI 29.56 kg/m    VITAL SIGNS:  reviewed GEN:  alert and oriented  RESPIRATORY:  no shortness of breath noted in conversation PSYCH:  normal affect  ASSESSMENT & PLAN:    1. Chronic systolic (congestive) heart failure (HCC)  - DG Chest 2 View  2. Environmental and seasonal allergies  - guaiFENesin (MUCINEX) 600 MG 12 hr tablet; Take 1 tablet (600 mg total) by mouth 2 (two) times daily for 10 days.  Dispense: 60 tablet; Refill: 1 - cromolyn (NASALCROM) 5.2 MG/ACT nasal spray; Place 1 spray into both nostrils 4  (four) times daily.  Dispense: 13 mL; Refill: 1 - loratadine (CLARITIN) 10 MG tablet; Take 1 tablet (10 mg total) by mouth daily.  Dispense: 30 tablet; Refill: 2  3. Sputum production  - DG Chest 2 View  4. Cough  - DG Chest 2 View   Time:   Today, I have spent 20 minutes with the patient with telehealth technology discussing the above problems.     Medication Adjustments/Labs and Tests Ordered: Current medicines are reviewed at length with the patient today.  Concerns regarding medicines are outlined above.   Tests Ordered: No orders of the defined types were placed in this encounter.   Medication Changes: Meds ordered this encounter  Medications  . losartan (COZAAR) 25 MG tablet    Sig: Take 1 tablet (25 mg total) by mouth daily.    Dispense:  30 tablet    Refill:  5    Disposition:  Follow up 04/16/2020 Signed, Perlie Mayo, NP  03/27/2020 3:37 PM     [facility]

## 2020-03-27 NOTE — Assessment & Plan Note (Signed)
Concern for PNA with S&S he reports today. Will get xray to rule out and check for edema. Pending findings will treat as appropriate.

## 2020-03-27 NOTE — Assessment & Plan Note (Signed)
Starting Tx for allergies incase xray is negative for edema or PNA Education on side effects provided.

## 2020-04-01 ENCOUNTER — Other Ambulatory Visit: Payer: Self-pay | Admitting: *Deleted

## 2020-04-01 MED ORDER — PANTOPRAZOLE SODIUM 40 MG PO TBEC: 40.0000 mg | DELAYED_RELEASE_TABLET | Freq: Every day | ORAL | 5 refills | Status: DC

## 2020-04-08 ENCOUNTER — Telehealth: Payer: Self-pay

## 2020-04-08 NOTE — Telephone Encounter (Signed)
Hydroxyzine 25mg  & Sertraline 25 mg  -please send to [facility] [address]

## 2020-04-09 ENCOUNTER — Other Ambulatory Visit: Payer: Self-pay

## 2020-04-09 MED ORDER — SERTRALINE HCL 25 MG PO TABS: 25.0000 mg | ORAL_TABLET | Freq: Every day | ORAL | 5 refills | Status: DC

## 2020-04-09 MED ORDER — HYDROXYZINE HCL 25 MG PO TABS: ORAL_TABLET | ORAL | 5 refills | Status: DC

## 2020-04-09 NOTE — Telephone Encounter (Signed)
Refills sent

## 2020-04-16 ENCOUNTER — Ambulatory Visit: Payer: BC Managed Care – PPO | Admitting: Family Medicine

## 2020-04-29 ENCOUNTER — Other Ambulatory Visit: Payer: Self-pay

## 2020-04-29 ENCOUNTER — Encounter: Payer: Self-pay | Admitting: Pulmonary Disease

## 2020-04-29 ENCOUNTER — Ambulatory Visit (INDEPENDENT_AMBULATORY_CARE_PROVIDER_SITE_OTHER): Payer: BC Managed Care – PPO | Admitting: Pulmonary Disease

## 2020-04-29 DIAGNOSIS — G4733 Obstructive sleep apnea (adult) (pediatric): Secondary | ICD-10-CM | POA: Diagnosis not present

## 2020-04-29 NOTE — Progress Notes (Signed)
Virtual Visit via Telephone Note  I connected with Antonio Cobb on 04/29/20 at  4:00 PM EDT by telephone and verified that I am speaking with the correct person using two identifiers.  Location: Patient: Home Provider: Office [facility] - [id number], [place], [place], [place] [address]   I discussed the limitations, risks, security and privacy concerns of performing an evaluation and management service by telephone and the availability of in person appointments. I also discussed with the patient that there may be a patient responsible charge related to this service. The patient expressed understanding and agreed to proceed.  Patient consented to consult via telephone: Yes People present and their role in pt care: Pt     History of Present Illness:  59 year old male former smoker followed in our office for obstructive sleep apnea  PMH: ETOH, CAD, ischemic CM, COPD, DM, HTN, HLD Smoker/ Smoking History: Former smoker. Quit 2017. Maintenance:  None  Pt of: Dr. Halford Chessman for sleep, Dr. Melvyn Novas for pulm   Chief complaint: 76-month follow-up   59 year old male former smoker followed in our office for obstructive sleep apnea.  He was presenting to our office today as a 86-month follow-up after his CPAP start.  Unfortunately the patient never received his CPAP from [place].  There seems to be confusion from the patient's hand as well as the DME companies N.  Per the DME company the patient was supposed to follow back up with them.  Per the patient he was waiting for follow-up from the DME company.  We will discuss this today.  Observations/Objective:  Pulmonary tests:  PFT 06/29/17 >> FEV1 2.31 (72%), FEV1% 82, TLC 4.56 (65%), DLCO 71%  Sleep tests:   PSG 02/21/20 >> AHI 18.9, SpO2 low 81%. Cardiac tests:  Echo 11/13/19 >> EF 25%, mild LVH   Social History   Tobacco Use  Smoking Status Former Smoker  . Packs/day: 0.25  . Years: 30.00  . Pack years: 7.50   . Types: Cigars  . Quit date: 05/23/2016  . Years since quitting: 3.9  Smokeless Tobacco Never Used  Tobacco Comment   since early 10s   Immunization History  Administered Date(s) Administered  . Influenza Split 11/11/2011, 09/07/2012  . Influenza,inj,Quad PF,6+ Mos 03/12/2016, 10/08/2016, 09/14/2017, 01/30/2019, 01/30/2019, 10/11/2019  . Moderna SARS-COVID-2 Vaccination 02/11/2020, 03/16/2020  . Pneumococcal Polysaccharide-23 08/13/2011, 10/08/2016  . Td 07/01/2004  . Tdap 07/05/2014      Assessment and Plan:  OSA (obstructive sleep apnea)  Emphasized need for CPAP therapy [place] is followed up with [employer] to request [employer] to follow back up with the patient Patient will need 91-month follow-up after CPAP start in order to ensure compliance  I instructed patient to contact her office in 7 days if he has not heard from a DME company   Follow Up Instructions:  Return in about 2 months (around 06/29/2020), or if symptoms worsen or fail to improve, for Follow up with Dr. Halford Chessman, Follow up with Wyn Quaker FNP-C.   I discussed the assessment and treatment plan with the patient. The patient was provided an opportunity to ask questions and all were answered. The patient agreed with the plan and demonstrated an understanding of the instructions.   The patient was advised to call back or seek an in-person evaluation if the symptoms worsen or if the condition fails to improve as anticipated.  I provided 14 minutes of non-face-to-face time during this encounter.   Lauraine Rinne,  NP

## 2020-04-29 NOTE — Assessment & Plan Note (Signed)
Emphasized need for CPAP therapy Antonio Cobb is followed up with [employer] to request [employer] to follow back up with the patient Patient will need 2-month follow-up after CPAP start in order to ensure compliance  I instructed patient to contact her office in 7 days if he has not heard from a DME company

## 2020-04-29 NOTE — Patient Instructions (Signed)
You were seen today by Lauraine Rinne, NP  for:   1. OSA (obstructive sleep apnea)   We will contact your DME company to investigate why you are not set up with your CPAP  Please also contact your DME company [employer]  We would like for you to start CPAP therapy  If no one has contacted you within the next 7 days please let our office know  We will plan on seeing you back in around 2 months after starting CPAP therapy  Follow Up:    Return in about 2 months (around 06/29/2020), or if symptoms worsen or fail to improve, for Follow up with Dr. Halford Chessman, Follow up with Wyn Quaker FNP-C.   Please do your part to reduce the spread of COVID-19:      Reduce your risk of any infection  and COVID19 by using the similar precautions used for avoiding the common cold or flu:  [name] Wash your hands often with soap and warm water for at least 20 seconds.  If soap and water are not readily available, use an alcohol-based hand sanitizer with at least 60% alcohol.  . If coughing or sneezing, cover your mouth and nose by coughing or sneezing into the elbow areas of your shirt or coat, into a tissue or into your sleeve (not your hands). Langley Gauss A MASK when in public  . Avoid shaking hands with others and consider head nods or verbal greetings only. . Avoid touching your eyes, nose, or mouth with unwashed hands.  . Avoid close contact with people who are sick. . Avoid places or events with large numbers of people in one location, like concerts or sporting events. . If you have some symptoms but not all symptoms, continue to monitor at home and seek medical attention if your symptoms worsen. . If you are having a medical emergency, call 911.   [place] / e-Visit: [email or web address]         [facility]: [facility]: [id number]                   [facility]: [id number]     It is flu season:   >>> Best ways to protect herself from the flu: Receive the yearly flu vaccine, practice good hand hygiene washing with soap and also using hand sanitizer when available, eat a nutritious meals, get adequate rest, hydrate appropriately   Please contact the office if your symptoms worsen or you have concerns that you are not improving.   Thank you for choosing [facility] for your healthcare, and for allowing [place] to partner with you on your healthcare journey. I am thankful to be able to provide care to you today.   Wyn Quaker FNP-C

## 2020-04-30 NOTE — Progress Notes (Signed)
Reviewed and agree with assessment/plan.   Chesley Mires, MD [facility] Pulmonary/Critical Care 04/30/2020, 8:36 AM Pager:  [phone]

## 2020-05-08 ENCOUNTER — Other Ambulatory Visit: Payer: Self-pay

## 2020-05-08 ENCOUNTER — Emergency Department (HOSPITAL_COMMUNITY): Payer: BC Managed Care – PPO

## 2020-05-08 ENCOUNTER — Inpatient Hospital Stay (HOSPITAL_COMMUNITY)
Admission: EM | Admit: 2020-05-08 | Discharge: 2020-05-10 | DRG: 291 | Disposition: A | Discharge status: Home / Self Care | Payer: BC Managed Care – PPO | Attending: Family Medicine | Admitting: Family Medicine

## 2020-05-08 ENCOUNTER — Inpatient Hospital Stay (HOSPITAL_COMMUNITY): Payer: BC Managed Care – PPO

## 2020-05-08 ENCOUNTER — Encounter (HOSPITAL_COMMUNITY): Payer: Self-pay | Admitting: Emergency Medicine

## 2020-05-08 DIAGNOSIS — N1831 Chronic kidney disease, stage 3a: Secondary | ICD-10-CM | POA: Diagnosis present

## 2020-05-08 DIAGNOSIS — J811 Chronic pulmonary edema: Secondary | ICD-10-CM

## 2020-05-08 DIAGNOSIS — I5021 Acute systolic (congestive) heart failure: Secondary | ICD-10-CM

## 2020-05-08 DIAGNOSIS — I130 Hypertensive heart and chronic kidney disease with heart failure and stage 1 through stage 4 chronic kidney disease, or unspecified chronic kidney disease: Principal | ICD-10-CM | POA: Diagnosis present

## 2020-05-08 DIAGNOSIS — I5022 Chronic systolic (congestive) heart failure: Secondary | ICD-10-CM | POA: Diagnosis not present

## 2020-05-08 DIAGNOSIS — Z7982 Long term (current) use of aspirin: Secondary | ICD-10-CM

## 2020-05-08 DIAGNOSIS — Z823 Family history of stroke: Secondary | ICD-10-CM | POA: Diagnosis not present

## 2020-05-08 DIAGNOSIS — R931 Abnormal findings on diagnostic imaging of heart and coronary circulation: Secondary | ICD-10-CM | POA: Diagnosis not present

## 2020-05-08 DIAGNOSIS — R0600 Dyspnea, unspecified: Secondary | ICD-10-CM | POA: Diagnosis present

## 2020-05-08 DIAGNOSIS — Z833 Family history of diabetes mellitus: Secondary | ICD-10-CM

## 2020-05-08 DIAGNOSIS — J9600 Acute respiratory failure, unspecified whether with hypoxia or hypercapnia: Secondary | ICD-10-CM | POA: Diagnosis not present

## 2020-05-08 DIAGNOSIS — Z825 Family history of asthma and other chronic lower respiratory diseases: Secondary | ICD-10-CM | POA: Diagnosis not present

## 2020-05-08 DIAGNOSIS — I5043 Acute on chronic combined systolic (congestive) and diastolic (congestive) heart failure: Secondary | ICD-10-CM | POA: Diagnosis present

## 2020-05-08 DIAGNOSIS — E1159 Type 2 diabetes mellitus with other circulatory complications: Secondary | ICD-10-CM | POA: Diagnosis not present

## 2020-05-08 DIAGNOSIS — J9601 Acute respiratory failure with hypoxia: Secondary | ICD-10-CM | POA: Diagnosis not present

## 2020-05-08 DIAGNOSIS — R531 Weakness: Secondary | ICD-10-CM | POA: Diagnosis present

## 2020-05-08 DIAGNOSIS — E119 Type 2 diabetes mellitus without complications: Secondary | ICD-10-CM

## 2020-05-08 DIAGNOSIS — E782 Mixed hyperlipidemia: Secondary | ICD-10-CM | POA: Diagnosis present

## 2020-05-08 DIAGNOSIS — G4733 Obstructive sleep apnea (adult) (pediatric): Secondary | ICD-10-CM | POA: Diagnosis present

## 2020-05-08 DIAGNOSIS — J810 Acute pulmonary edema: Secondary | ICD-10-CM | POA: Diagnosis not present

## 2020-05-08 DIAGNOSIS — E1122 Type 2 diabetes mellitus with diabetic chronic kidney disease: Secondary | ICD-10-CM | POA: Diagnosis present

## 2020-05-08 DIAGNOSIS — Z794 Long term (current) use of insulin: Secondary | ICD-10-CM

## 2020-05-08 DIAGNOSIS — N179 Acute kidney failure, unspecified: Secondary | ICD-10-CM | POA: Diagnosis present

## 2020-05-08 DIAGNOSIS — Z6829 Body mass index (BMI) 29.0-29.9, adult: Secondary | ICD-10-CM

## 2020-05-08 DIAGNOSIS — Z79899 Other long term (current) drug therapy: Secondary | ICD-10-CM

## 2020-05-08 DIAGNOSIS — Z20822 Contact with and (suspected) exposure to covid-19: Secondary | ICD-10-CM | POA: Diagnosis not present

## 2020-05-08 DIAGNOSIS — I255 Ischemic cardiomyopathy: Secondary | ICD-10-CM | POA: Diagnosis not present

## 2020-05-08 DIAGNOSIS — R0902 Hypoxemia: Secondary | ICD-10-CM

## 2020-05-08 DIAGNOSIS — E663 Overweight: Secondary | ICD-10-CM

## 2020-05-08 DIAGNOSIS — Z95810 Presence of automatic (implantable) cardiac defibrillator: Secondary | ICD-10-CM

## 2020-05-08 DIAGNOSIS — I509 Heart failure, unspecified: Secondary | ICD-10-CM

## 2020-05-08 DIAGNOSIS — Z8249 Family history of ischemic heart disease and other diseases of the circulatory system: Secondary | ICD-10-CM | POA: Diagnosis not present

## 2020-05-08 DIAGNOSIS — R0602 Shortness of breath: Secondary | ICD-10-CM | POA: Diagnosis not present

## 2020-05-08 DIAGNOSIS — Z91199 Patient's noncompliance with other medical treatment and regimen due to unspecified reason: Secondary | ICD-10-CM

## 2020-05-08 DIAGNOSIS — I2510 Atherosclerotic heart disease of native coronary artery without angina pectoris: Secondary | ICD-10-CM | POA: Diagnosis present

## 2020-05-08 DIAGNOSIS — J449 Chronic obstructive pulmonary disease, unspecified: Secondary | ICD-10-CM | POA: Diagnosis present

## 2020-05-08 DIAGNOSIS — I10 Essential (primary) hypertension: Secondary | ICD-10-CM | POA: Diagnosis not present

## 2020-05-08 DIAGNOSIS — I252 Old myocardial infarction: Secondary | ICD-10-CM

## 2020-05-08 DIAGNOSIS — Z955 Presence of coronary angioplasty implant and graft: Secondary | ICD-10-CM

## 2020-05-08 DIAGNOSIS — Z9119 Patient's noncompliance with other medical treatment and regimen: Secondary | ICD-10-CM

## 2020-05-08 DIAGNOSIS — I110 Hypertensive heart disease with heart failure: Secondary | ICD-10-CM | POA: Diagnosis not present

## 2020-05-08 HISTORY — DX: Acute systolic (congestive) heart failure: I50.21

## 2020-05-08 LAB — CBG MONITORING, ED: Glucose-Capillary: 366 mg/dL — ABNORMAL HIGH (ref 70–99)

## 2020-05-08 LAB — CBC WITH DIFFERENTIAL/PLATELET
Abs Immature Granulocytes: 0.03 10*3/uL (ref 0.00–0.07)
Basophils Absolute: 0.1 10*3/uL (ref 0.0–0.1)
Basophils Relative: 1 %
Eosinophils Absolute: 1.2 10*3/uL — ABNORMAL HIGH (ref 0.0–0.5)
Eosinophils Relative: 13 %
HCT: 49 % (ref 39.0–52.0)
Hemoglobin: 15.5 g/dL (ref 13.0–17.0)
Immature Granulocytes: 0 %
Lymphocytes Relative: 35 %
Lymphs Abs: 3.2 10*3/uL (ref 0.7–4.0)
MCH: 27.4 pg (ref 26.0–34.0)
MCHC: 31.6 g/dL (ref 30.0–36.0)
MCV: 86.6 fL (ref 80.0–100.0)
Monocytes Absolute: 1 10*3/uL (ref 0.1–1.0)
Monocytes Relative: 10 %
Neutro Abs: 3.8 10*3/uL (ref 1.7–7.7)
Neutrophils Relative %: 41 %
Platelets: 258 10*3/uL (ref 150–400)
RBC: 5.66 MIL/uL (ref 4.22–5.81)
RDW: 13 % (ref 11.5–15.5)
WBC: 9.2 10*3/uL (ref 4.0–10.5)
nRBC: 0 % (ref 0.0–0.2)

## 2020-05-08 LAB — COMPREHENSIVE METABOLIC PANEL
ALT: 28 U/L (ref 0–44)
AST: 25 U/L (ref 15–41)
Albumin: 3.8 g/dL (ref 3.5–5.0)
Alkaline Phosphatase: 101 U/L (ref 38–126)
Anion gap: 15 (ref 5–15)
BUN: 28 mg/dL — ABNORMAL HIGH (ref 6–20)
CO2: 24 mmol/L (ref 22–32)
Calcium: 8.8 mg/dL — ABNORMAL LOW (ref 8.9–10.3)
Chloride: 96 mmol/L — ABNORMAL LOW (ref 98–111)
Creatinine, Ser: 1.58 mg/dL — ABNORMAL HIGH (ref 0.61–1.24)
GFR calc Af Amer: 55 mL/min — ABNORMAL LOW (ref >60)
GFR calc non Af Amer: 47 mL/min — ABNORMAL LOW (ref >60)
Glucose, Bld: 431 mg/dL — ABNORMAL HIGH (ref 70–99)
Potassium: 3.3 mmol/L — ABNORMAL LOW (ref 3.5–5.1)
Sodium: 135 mmol/L (ref 135–145)
Total Bilirubin: 1.1 mg/dL (ref 0.3–1.2)
Total Protein: 7.8 g/dL (ref 6.5–8.1)

## 2020-05-08 LAB — BRAIN NATRIURETIC PEPTIDE: B Natriuretic Peptide: 531 pg/mL — ABNORMAL HIGH (ref 0.0–100.0)

## 2020-05-08 LAB — GLUCOSE, CAPILLARY: Glucose-Capillary: 348 mg/dL — ABNORMAL HIGH (ref 70–99)

## 2020-05-08 LAB — TROPONIN I (HIGH SENSITIVITY)
Troponin I (High Sensitivity): 25 ng/L — ABNORMAL HIGH (ref <18)
Troponin I (High Sensitivity): 41 ng/L — ABNORMAL HIGH (ref <18)

## 2020-05-08 LAB — SARS CORONAVIRUS 2 BY RT PCR (HOSPITAL ORDER, PERFORMED IN ~~LOC~~ HOSPITAL LAB): SARS Coronavirus 2: NEGATIVE

## 2020-05-08 LAB — HEMOGLOBIN A1C
Hgb A1c MFr Bld: 10.2 % — ABNORMAL HIGH (ref 4.8–5.6)
Mean Plasma Glucose: 246.04 mg/dL

## 2020-05-08 LAB — HIV ANTIBODY (ROUTINE TESTING W REFLEX): HIV Screen 4th Generation wRfx: NONREACTIVE

## 2020-05-08 MED ORDER — INSULIN GLARGINE 100 UNIT/ML ~~LOC~~ SOLN
50.0000 [IU] | Freq: Every evening | SUBCUTANEOUS | Status: DC
  Administered 2020-05-09: 50 [IU] via SUBCUTANEOUS
  Filled 2020-05-08 (×4): qty 0.5

## 2020-05-08 MED ORDER — INSULIN ASPART 100 UNIT/ML ~~LOC~~ SOLN
0.0000 [IU] | Freq: Three times a day (TID) | SUBCUTANEOUS | Status: DC
  Administered 2020-05-08: 9 [IU] via SUBCUTANEOUS
  Administered 2020-05-09: 5 [IU] via SUBCUTANEOUS
  Administered 2020-05-09: 3 [IU] via SUBCUTANEOUS
  Administered 2020-05-10: 2 [IU] via SUBCUTANEOUS
  Administered 2020-05-10: 3 [IU] via SUBCUTANEOUS
  Filled 2020-05-08: qty 1

## 2020-05-08 MED ORDER — INSULIN GLARGINE 100 UNIT/ML ~~LOC~~ SOLN
40.0000 [IU] | Freq: Every evening | SUBCUTANEOUS | Status: DC
  Administered 2020-05-08: 40 [IU] via SUBCUTANEOUS
  Filled 2020-05-08 (×2): qty 0.4

## 2020-05-08 MED ORDER — ACETAMINOPHEN 325 MG PO TABS: 650.0000 mg | ORAL_TABLET | ORAL | Status: DC | PRN

## 2020-05-08 MED ORDER — SODIUM CHLORIDE 0.9 % IV SOLN: 250.0000 mL | INTRAVENOUS | Status: DC | PRN

## 2020-05-08 MED ORDER — FUROSEMIDE 10 MG/ML IJ SOLN
60.0000 mg | Freq: Two times a day (BID) | INTRAMUSCULAR | Status: DC
  Administered 2020-05-08 – 2020-05-10 (×3): 60 mg via INTRAVENOUS
  Filled 2020-05-08 (×3): qty 6

## 2020-05-08 MED ORDER — FUROSEMIDE 10 MG/ML IJ SOLN
80.0000 mg | Freq: Once | INTRAMUSCULAR | Status: AC
  Administered 2020-05-08: 80 mg via INTRAVENOUS
  Filled 2020-05-08: qty 8

## 2020-05-08 MED ORDER — ROSUVASTATIN CALCIUM 20 MG PO TABS
40.0000 mg | ORAL_TABLET | Freq: Every evening | ORAL | Status: DC
  Administered 2020-05-09: 40 mg via ORAL
  Filled 2020-05-08 (×5): qty 2

## 2020-05-08 MED ORDER — NITROGLYCERIN 2 % TD OINT
1.0000 [in_us] | TOPICAL_OINTMENT | Freq: Once | TRANSDERMAL | Status: AC
  Administered 2020-05-08: 1 [in_us] via TOPICAL
  Filled 2020-05-08: qty 1

## 2020-05-08 MED ORDER — SODIUM CHLORIDE 0.9% FLUSH: 3.0000 mL | INTRAVENOUS | Status: DC | PRN

## 2020-05-08 MED ORDER — ENOXAPARIN SODIUM 30 MG/0.3ML ~~LOC~~ SOLN: 30.0000 mg | SUBCUTANEOUS | Status: DC

## 2020-05-08 MED ORDER — INSULIN ASPART 100 UNIT/ML ~~LOC~~ SOLN
10.0000 [IU] | Freq: Three times a day (TID) | SUBCUTANEOUS | Status: DC
  Administered 2020-05-08: 10 [IU] via SUBCUTANEOUS
  Filled 2020-05-08: qty 1

## 2020-05-08 MED ORDER — MELATONIN 3 MG PO TABS
5.0000 mg | ORAL_TABLET | Freq: Every evening | ORAL | Status: DC | PRN
  Filled 2020-05-08: qty 1

## 2020-05-08 MED ORDER — ENOXAPARIN SODIUM 40 MG/0.4ML ~~LOC~~ SOLN
40.0000 mg | SUBCUTANEOUS | Status: DC
  Administered 2020-05-08 – 2020-05-09 (×2): 40 mg via SUBCUTANEOUS
  Filled 2020-05-08 (×2): qty 0.4

## 2020-05-08 MED ORDER — LORATADINE 10 MG PO TABS
10.0000 mg | ORAL_TABLET | Freq: Every day | ORAL | Status: DC
  Administered 2020-05-08 – 2020-05-10 (×3): 10 mg via ORAL
  Filled 2020-05-08 (×3): qty 1

## 2020-05-08 MED ORDER — INSULIN ASPART 100 UNIT/ML ~~LOC~~ SOLN
14.0000 [IU] | Freq: Three times a day (TID) | SUBCUTANEOUS | Status: DC
  Administered 2020-05-09 – 2020-05-10 (×5): 14 [IU] via SUBCUTANEOUS

## 2020-05-08 MED ORDER — HYDROXYZINE HCL 25 MG PO TABS
25.0000 mg | ORAL_TABLET | Freq: Every evening | ORAL | Status: DC | PRN
  Administered 2020-05-08: 25 mg via ORAL
  Filled 2020-05-08: qty 1

## 2020-05-08 MED ORDER — DOCUSATE SODIUM 100 MG PO CAPS
100.0000 mg | ORAL_CAPSULE | Freq: Every day | ORAL | Status: DC
  Administered 2020-05-08 – 2020-05-10 (×3): 100 mg via ORAL
  Filled 2020-05-08 (×3): qty 1

## 2020-05-08 MED ORDER — ADULT MULTIVITAMIN W/MINERALS CH
1.0000 | ORAL_TABLET | Freq: Every day | ORAL | Status: DC
  Administered 2020-05-08 – 2020-05-10 (×3): 1 via ORAL
  Filled 2020-05-08 (×6): qty 1

## 2020-05-08 MED ORDER — METOPROLOL SUCCINATE ER 50 MG PO TB24
50.0000 mg | ORAL_TABLET | Freq: Every day | ORAL | Status: DC
  Administered 2020-05-09 – 2020-05-10 (×2): 50 mg via ORAL
  Filled 2020-05-08: qty 2
  Filled 2020-05-08 (×2): qty 1

## 2020-05-08 MED ORDER — BUSPIRONE HCL 5 MG PO TABS
10.0000 mg | ORAL_TABLET | Freq: Three times a day (TID) | ORAL | Status: DC
  Administered 2020-05-08 – 2020-05-10 (×6): 10 mg via ORAL
  Filled 2020-05-08 (×6): qty 2

## 2020-05-08 MED ORDER — INSULIN ASPART 100 UNIT/ML ~~LOC~~ SOLN
0.0000 [IU] | Freq: Every day | SUBCUTANEOUS | Status: DC
  Administered 2020-05-08: 4 [IU] via SUBCUTANEOUS

## 2020-05-08 MED ORDER — ASPIRIN EC 81 MG PO TBEC
81.0000 mg | DELAYED_RELEASE_TABLET | Freq: Every day | ORAL | Status: DC
  Administered 2020-05-09 – 2020-05-10 (×2): 81 mg via ORAL
  Filled 2020-05-08 (×2): qty 1

## 2020-05-08 MED ORDER — SPIRONOLACTONE 25 MG PO TABS
12.5000 mg | ORAL_TABLET | Freq: Every day | ORAL | Status: DC
  Administered 2020-05-08 – 2020-05-10 (×3): 12.5 mg via ORAL
  Filled 2020-05-08 (×8): qty 0.5
  Filled 2020-05-08 (×2): qty 1

## 2020-05-08 MED ORDER — POTASSIUM CHLORIDE CRYS ER 20 MEQ PO TBCR
20.0000 meq | EXTENDED_RELEASE_TABLET | Freq: Two times a day (BID) | ORAL | Status: DC
  Administered 2020-05-08 – 2020-05-10 (×5): 20 meq via ORAL
  Filled 2020-05-08 (×5): qty 1

## 2020-05-08 MED ORDER — SODIUM CHLORIDE 0.9% FLUSH
3.0000 mL | Freq: Two times a day (BID) | INTRAVENOUS | Status: DC
  Administered 2020-05-08 – 2020-05-10 (×3): 3 mL via INTRAVENOUS

## 2020-05-08 NOTE — Progress Notes (Signed)
**Note De-Identified Antonio Cobb Obfuscation** Patient removed from BIPAP and placed on 4L [place]; tolerating well. VS WNL SAT 99%.  RRT to continue to monitor.
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
## 2020-05-08 NOTE — ED Provider Notes (Signed)
[facility] EMERGENCY DEPARTMENT Provider Note   CSN: [id number] Arrival date & time: 05/08/20  [id number]   Time seen 6:37 AM on arrival  History Chief Complaint  Patient presents with  . Shortness of Breath   Level 5 caveat for respiratory distress  Antonio Cobb is a 59 y.o. male.  HPI   Per EMS patient was driving to work and had acute onset of shortness of breath.  On their arrival his pulse ox was in the 70% range.  He was very tachypneic and they thought he was wheezing.  He was given 125 mg of Solu-Medrol and was given an albuterol nebulizer treatment with albuterol 5 mg plus Atrovent 0.5 mg..  When I asked patient he can only shake his head yes or no, he denies asthma or COPD but he does admit to congestive heart failure.  PCP [name], MD]   Past Medical History:  Diagnosis Date  . Acute CHF (congestive heart failure) ([place]) 01/21/2020  . Acute on chronic combined systolic and diastolic CHF (congestive heart failure) ([place])   . Alcohol abuse   . CAD (coronary artery disease)    a. s/p inferolateral STEMI in 05/2012 with DES to LCx b. patent stent by cath in 05/2016 with moderate RCA and LAD disease  . Cardiomyopathy ([place])    a. LVEF 30-35% by echo in 09/2016. b. 08/2017: echo showing EF remains reduced at 25-30% c. s/p [employer] ICD placement in 01/2018  . CHF (congestive heart failure) ([place])   . CHF exacerbation ([place]) 11/18/2019  . COPD (chronic obstructive pulmonary disease) ([place])   . Diabetes mellitus, type 2 ([place])   . Essential hypertension   . Hypertensive urgency   . Mitral regurgitation    Moderate  . Mixed hyperlipidemia   . Myocardial infarction ([place]) 05/23/2012   Inferolateral STEMI  . Obesity     Patient Active Problem List   Diagnosis Date Noted  . Environmental and seasonal allergies 03/27/2020  . OSA (obstructive sleep apnea)  01/24/2020  . Dyspnea 01/21/2020  . Encounter for support and coordination of transition of care  11/30/2019  . CAD (coronary artery disease)- s/p STEMI with DES to Lt Cir in 2013/S/p AICD 01/2018 11/19/2019  . Ischemic cardiomyopathy with implantable cardioverter-defibrillator (ICD)/s/p STEMI with DES to Lt Cir in 2013/S/p AICD 01/2018 11/19/2019  . Anxiety attack/panic attacks 11/19/2019  . Flash pulmonary edema ([place]) 11/13/2019  . Uncontrolled type 1 diabetes mellitus with hyperglycemia ([place]) 10/11/2019  . Hospital discharge follow-up 04/15/2019  . Pulmonary edema 04/05/2019  . Muscle pain 02/28/2018  . Chronic systolic (congestive) heart failure ([place]) 02/01/2018  . DM type 2 causing vascular disease ([place]) 10/18/2017  . Personal history of noncompliance with medical treatment, presenting hazards to health 10/18/2017  . Lumbar degenerative disc disease 06/20/2017  . Upper airway cough syndrome 05/27/2017  . Chronic systolic heart failure ([place]) 11/01/2016  . Acute respiratory failure with hypoxia and hypercapnia (HCC)   . AKI (acute kidney injury) ([place]) 10/07/2016  . Prolonged QT interval 10/07/2016  . Coronary artery disease involving coronary bypass graft of native heart with angina pectoris ([place])   . Abnormal echocardiogram   . Allergic rhinitis 05/06/2015  . Cough 05/10/2013  . Alcohol abuse 05/23/2012  . Arteriosclerotic cardiovascular disease (ASCVD) 05/23/2012  . ED (erectile dysfunction) 05/10/2012  . Overweight with body mass index (BMI) of 29 to 29.9 in adult 08/12/2010  . Insulin-requiring or dependent type II diabetes mellitus ([place]) 03/08/2008  . Mixed  hyperlipidemia 03/08/2008  . Essential hypertension 03/08/2008    Past Surgical History:  Procedure Laterality Date  . CARDIAC CATHETERIZATION  2 yrs ago  . CARDIAC CATHETERIZATION N/A 05/15/2016   Procedure: Right/Left Heart Cath and Coronary Angiography;  Surgeon: [name], MD;  Location: [place] [facility];  Service: Cardiovascular;  Laterality: N/A;  . CORONARY STENT PLACEMENT  05/23/12  . ICD IMPLANT N/A  02/01/2018   Procedure: ICD IMPLANT;  Surgeon: Evans Lance, MD;  Location: [place] [facility];  Service: Cardiovascular;  Laterality: N/A;  . LEFT HEART CATHETERIZATION WITH CORONARY ANGIOGRAM N/A 05/23/2012   Procedure: LEFT HEART CATHETERIZATION WITH CORONARY ANGIOGRAM;  Surgeon: [name], MD;  Location: [facility] CATH LAB;  Service: Cardiovascular;  Laterality: N/A;  . None    . PERCUTANEOUS CORONARY STENT INTERVENTION (PCI-S) N/A 05/23/2012   Procedure: PERCUTANEOUS CORONARY STENT INTERVENTION (PCI-S);  Surgeon: [name], MD;  Location: [facility] CATH LAB;  Service: Cardiovascular;  Laterality: N/A;  . POLYPECTOMY  10/16/2011   Procedure: POLYPECTOMY;  Surgeon: [name], MD;  Location: [facility];  Service: Endoscopy;;  Polypoid Lesion, Transverse and Sigmoid Colon       Family History  Problem Relation Age of Onset  . Emphysema Mother   . Heart attack Mother   . Hypertension Mother   . Diabetes Mother   . Heart disease Mother   . COPD Mother   . Emphysema Father   . COPD Father   . Stroke Sister   . Asthma Sister   . Colon cancer Neg Hx   . Anesthesia problems Neg Hx   . Hypotension Neg Hx   . Malignant hyperthermia Neg Hx   . Pseudochol deficiency Neg Hx     Social History   Tobacco Use  . Smoking status: Former Smoker    Packs/day: 0.25    Years: 30.00    Pack years: 7.50    Types: Cigars    Quit date: 05/23/2016    Years since quitting: 3.9  . Smokeless tobacco: Never Used  . Tobacco comment: since early 75s  Substance Use Topics  . Alcohol use: Yes    Alcohol/week: 0.0 standard drinks    Comment: occ  . Drug use: No  employed  Home Medications Prior to Admission medications   Medication Sig Start Date End Date Taking? Authorizing Provider  albuterol (VENTOLIN HFA) 108 (90 Base) MCG/ACT inhaler Inhale 2 puffs into the lungs every 6 (six) hours as needed for wheezing or shortness of breath. 11/20/19   Roxan Hockey, MD  aspirin EC 81 MG EC tablet Take 1  tablet (81 mg total) by mouth daily with breakfast. 11/20/19   Emokpae, Courage, MD  busPIRone (BUSPAR) 10 MG tablet Take 1 tablet (10 mg total) by mouth 3 (three) times daily. For anxiety 11/20/19   Roxan Hockey, MD  cromolyn (NASALCROM) 5.2 MG/ACT nasal spray Place 1 spray into both nostrils 4 (four) times daily. 03/27/20   Perlie Mayo, NP  docusate sodium (COLACE) 100 MG capsule Take 100 mg by mouth daily.    [provider]  famotidine (PEPCID) 20 MG tablet Take 1 tablet (20 mg total) by mouth at bedtime. 01/25/20   Orson Eva, MD  glucose blood (FREESTYLE LITE) test strip TEST FOUR TIMES DAILY 10/05/19   [name], MD  hydrOXYzine (ATARAX/VISTARIL) 25 MG tablet Take one tablet by mouth at bedtime, for anxiety and sleep 04/09/20   [name], MD  insulin regular (NOVOLIN  R,HUMULIN R) 100 units/mL injection Inject 0.1 mLs (10 Units total) into the skin 3 (three) times daily before meals. 03/27/19   Cassandria Anger, MD  LANTUS SOLOSTAR 100 UNIT/ML Solostar Pen ADMINISTER 50 UNITS UNDER THE SKIN EVERY NIGHT AT BEDTIME Patient taking differently: Inject 50 Units into the skin every evening.  08/14/19   Nida, Marella Chimes, MD  loratadine (CLARITIN) 10 MG tablet Take 1 tablet (10 mg total) by mouth daily. 03/27/20   Perlie Mayo, NP  losartan (COZAAR) 25 MG tablet Take 1 tablet (25 mg total) by mouth daily. 03/27/20   Perlie Mayo, NP  Melatonin 10 MG TABS Take 2 tablets by mouth at bedtime.    [provider]  metoprolol succinate (TOPROL-XL) 50 MG 24 hr tablet TAKE 1 TABLET BY MOUTH EVERY DAY WITH FOOD Patient taking differently: Take 50 mg by mouth daily. TAKE 1 TABLET BY MOUTH EVERY DAY WITH FOOD 11/20/19   Roxan Hockey, MD  Multiple Vitamins-Minerals (MULTIVITAMIN WITH MINERALS) tablet Take 1 tablet by mouth daily. 11/20/19 11/19/20  Roxan Hockey, MD  nitroGLYCERIN (NITROSTAT) 0.4 MG SL tablet Place 1 tablet (0.4 mg total) under the tongue  every 5 (five) minutes as needed for chest pain. 05/29/16 02/27/20  Lendon Colonel, NP  pantoprazole (PROTONIX) 40 MG tablet Take 1 tablet (40 mg total) by mouth daily. Take 30-73min before first meal of the day. 04/01/20   Lauraine Rinne, NP  potassium chloride SA (KLOR-CON) 20 MEQ tablet Take 1 tablet (20 mEq total) by mouth daily. 11/20/19   Roxan Hockey, MD  rosuvastatin (CRESTOR) 40 MG tablet Take 1 tablet (40 mg total) by mouth daily. 11/20/19   Roxan Hockey, MD  sertraline (ZOLOFT) 25 MG tablet Take 1 tablet (25 mg total) by mouth daily. For anxiety/panic attacks 04/09/20 04/09/21  [name], MD  spironolactone (ALDACTONE) 25 MG tablet Take 0.5 tablets (12.5 mg total) by mouth daily. 02/27/20   Arnoldo Lenis, MD  torsemide (DEMADEX) 20 MG tablet Take 2 tablets (40 mg total) by mouth daily. 01/26/20   Orson Eva, MD    Allergies    [name] [sacubitril-valsartan] and Other  Review of Systems   Review of Systems  Unable to perform ROS: Severe respiratory distress    Physical Exam Updated Vital Signs BP (!) 168/127 (BP Location: Left Arm)   Pulse (!) 106   Temp (!) 97.4 F (36.3 C) (Axillary)   Resp (!) 28   Ht 5\' 10"  (1.778 m)   Wt 94 kg   SpO2 96%   BMI 29.73 kg/m   Vital signs normal except for hypertension and tachycardia   Physical Exam Vitals and nursing note reviewed.  Constitutional:      Appearance: Normal appearance. He is obese.  HENT:     Head: Normocephalic and atraumatic.  Eyes:     Extraocular Movements: Extraocular movements intact.     Conjunctiva/sclera: Conjunctivae normal.     Pupils: Pupils are equal, round, and reactive to light.  Cardiovascular:     Rate and Rhythm: Regular rhythm. Tachycardia present.  Pulmonary:     Effort: Tachypnea, accessory muscle usage and prolonged expiration present.     Breath sounds: Decreased air movement present. Examination of the right-upper field reveals rales. Examination of the left-upper  field reveals rales. Examination of the right-middle field reveals rales. Examination of the left-middle field reveals rales. Examination of the right-lower field reveals rales. Examination of the left-lower field reveals rales. Rales present.  Comments: Patient has audible rales Abdominal:     General: There is distension.     Palpations: Abdomen is soft.     Tenderness: There is no abdominal tenderness.  Musculoskeletal:     Cervical back: Normal range of motion.     Comments: Patient has trace pitting edema of his lower extremities  Skin:    Capillary Refill: Capillary refill takes less than 2 seconds.     Comments: Skin is cool but dry  Neurological:     Mental Status: He is alert.     Comments: Patient is not able to cooperate fully however he appears to be aware of his surroundings and follows commands.  Psychiatric:     Comments: Unable to assess     ED Results / Procedures / Treatments   Labs (all labs ordered are listed, but only abnormal results are displayed) Results for orders placed or performed during the hospital encounter of 05/08/20  CBC with Differential  Result Value Ref Range   WBC 9.2 4.0 - 10.5 K/uL   RBC 5.66 4.22 - 5.81 MIL/uL   Hemoglobin 15.5 13.0 - 17.0 g/dL   HCT 49.0 39.0 - 52.0 %   MCV 86.6 80.0 - 100.0 fL   MCH 27.4 26.0 - 34.0 pg   MCHC 31.6 30.0 - 36.0 g/dL   RDW 13.0 11.5 - 15.5 %   Platelets 258 150 - 400 K/uL   nRBC 0.0 0.0 - 0.2 %   Neutrophils Relative % 41 %   Neutro Abs 3.8 1.7 - 7.7 K/uL   Lymphocytes Relative 35 %   Lymphs Abs 3.2 0.7 - 4.0 K/uL   Monocytes Relative 10 %   Monocytes Absolute 1.0 0.1 - 1.0 K/uL   Eosinophils Relative 13 %   Eosinophils Absolute 1.2 (H) 0.0 - 0.5 K/uL   Basophils Relative 1 %   Basophils Absolute 0.1 0.0 - 0.1 K/uL   Immature Granulocytes 0 %   Abs Immature Granulocytes 0.03 0.00 - 0.07 K/uL   Laboratory tests pending    EKG EKG Interpretation  Date/Time:  Wednesday May 08 2020  06:41:53 EDT Ventricular Rate:  112 PR Interval:    QRS Duration: 88 QT Interval:  339 QTC Calculation: 463 R Axis:   -10 Text Interpretation: Sinus tachycardia Borderline repolarization abnormality Baseline wander Electrode noise Since last tracing rate faster 22 Jan 2020 Confirmed by Rolland Porter [phone]) on 05/08/2020 6:52:58 AM   Radiology No results found.  Procedures .Critical Care Performed by: Rolland Porter, MD Authorized by: Rolland Porter, MD   Critical care provider statement:    Critical care time (minutes):  32   Critical care was necessary to treat or prevent imminent or life-threatening deterioration of the following conditions:  Respiratory failure   Critical care was time spent personally by me on the following activities:  Discussions with consultants, examination of patient, obtaining history from patient or surrogate, ordering and review of laboratory studies, ordering and review of radiographic studies, pulse oximetry, re-evaluation of patient's condition and review of old charts   (including critical care time)  Medications Ordered in ED Medications  nitroGLYCERIN (NITROGLYN) 2 % ointment 1 inch (1 inch Topical Given 05/08/20 0647)  furosemide (LASIX) injection 80 mg (80 mg Intravenous Given 05/08/20 [id number])    ED Course  I have reviewed the triage vital signs and the nursing notes.  Pertinent labs & imaging results that were available during my care of the patient were reviewed by me and considered  in my medical decision making (see chart for details).    MDM Rules/Calculators/A&P                      Patient is having a lot of struggling to breathe, he was placed on BiPAP.  He was given Lasix IV, when I checked his last BM it was in February 10 and his BUN was 33 and creatinine was 1.29.  He was given Lasix 80 mg IV.  EMS had reported his blood pressure was about 160, he was given nitroglycerin 1 inch to skin.  Recheck at 6:48 AM patient is on BiPAP.  He appears to  be struggling a little less, he still has some increased work of breathing however.  His pulse ox is 97% on the BiPAP, heart rate 103, blood pressure 163/127, respiratory rate 27.  He is getting his IV Lasix now and nitroglycerin paste has been placed on his chest.  Patient gives [place] the thumbs up that he is starting to feel better.  I looked at his chest x-ray on the monitor of the portable machine and patient does have an x-ray suggestive of congestive heart failure.  Patient is able to communicate with me that he had been having some mild shortness of breath and then it got acutely worse this morning.  He denies chest pain or chest tightness.  Pt turned over to Dr Wilson Singer at change of shift   Patient has had the [place] vaccine.  Final Clinical Impression(s) / ED Diagnoses Final diagnoses:  Acute on chronic congestive heart failure, unspecified heart failure type ([place])  Hypoxia  Acute respiratory failure with hypoxia ([place])    Rx / DC Orders   Disposition pending, but most likely admission  Rolland Porter, MD, Barbette Or, MD 05/08/20 [phone]

## 2020-05-08 NOTE — ED Notes (Signed)
Family at bedside. 

## 2020-05-08 NOTE — ED Triage Notes (Signed)
Pt was on his way to work when he became very short of breath. EMS arrived to find pt tachypneic and with sats at 72% on room air. Pt given Albuterol/ Atrovent neb, and 125 Solumedrol en-route.

## 2020-05-08 NOTE — ED Notes (Signed)
Pt off of BIPAP, tolerating well

## 2020-05-09 ENCOUNTER — Inpatient Hospital Stay (HOSPITAL_COMMUNITY): Payer: BC Managed Care – PPO

## 2020-05-09 DIAGNOSIS — I5043 Acute on chronic combined systolic (congestive) and diastolic (congestive) heart failure: Secondary | ICD-10-CM

## 2020-05-09 DIAGNOSIS — J9600 Acute respiratory failure, unspecified whether with hypoxia or hypercapnia: Secondary | ICD-10-CM | POA: Diagnosis not present

## 2020-05-09 DIAGNOSIS — I5021 Acute systolic (congestive) heart failure: Secondary | ICD-10-CM

## 2020-05-09 DIAGNOSIS — J9601 Acute respiratory failure with hypoxia: Secondary | ICD-10-CM | POA: Diagnosis not present

## 2020-05-09 LAB — COMPREHENSIVE METABOLIC PANEL
ALT: 24 U/L (ref 0–44)
AST: 21 U/L (ref 15–41)
Albumin: 3.6 g/dL (ref 3.5–5.0)
Alkaline Phosphatase: 86 U/L (ref 38–126)
Anion gap: 13 (ref 5–15)
BUN: 27 mg/dL — ABNORMAL HIGH (ref 6–20)
CO2: 29 mmol/L (ref 22–32)
Calcium: 9.3 mg/dL (ref 8.9–10.3)
Chloride: 97 mmol/L — ABNORMAL LOW (ref 98–111)
Creatinine, Ser: 1.3 mg/dL — ABNORMAL HIGH (ref 0.61–1.24)
GFR calc Af Amer: >60 mL/min (ref >60)
GFR calc non Af Amer: 60 mL/min — ABNORMAL LOW (ref >60)
Glucose, Bld: 213 mg/dL — ABNORMAL HIGH (ref 70–99)
Potassium: 3.4 mmol/L — ABNORMAL LOW (ref 3.5–5.1)
Sodium: 139 mmol/L (ref 135–145)
Total Bilirubin: 1.4 mg/dL — ABNORMAL HIGH (ref 0.3–1.2)
Total Protein: 7.4 g/dL (ref 6.5–8.1)

## 2020-05-09 LAB — GLUCOSE, CAPILLARY
Glucose-Capillary: 228 mg/dL — ABNORMAL HIGH (ref 70–99)
Glucose-Capillary: 206 mg/dL — ABNORMAL HIGH (ref 70–99)
Glucose-Capillary: 224 mg/dL — ABNORMAL HIGH (ref 70–99)
Glucose-Capillary: 289 mg/dL — ABNORMAL HIGH (ref 70–99)
Glucose-Capillary: 95 mg/dL (ref 70–99)
Glucose-Capillary: 127 mg/dL — ABNORMAL HIGH (ref 70–99)

## 2020-05-09 LAB — LIPID PANEL
Cholesterol: 249 mg/dL — ABNORMAL HIGH (ref 0–200)
HDL: 47 mg/dL (ref >40)
LDL Cholesterol: 149 mg/dL — ABNORMAL HIGH (ref 0–99)
Total CHOL/HDL Ratio: 5.3 RATIO
Triglycerides: 267 mg/dL — ABNORMAL HIGH (ref <150)
VLDL: 53 mg/dL — ABNORMAL HIGH (ref 0–40)

## 2020-05-09 LAB — ECHOCARDIOGRAM COMPLETE
Height: 70 in
Weight: 3291.03 oz

## 2020-05-09 LAB — BRAIN NATRIURETIC PEPTIDE: B Natriuretic Peptide: 649 pg/mL — ABNORMAL HIGH (ref 0.0–100.0)

## 2020-05-09 LAB — MAGNESIUM: Magnesium: 2.2 mg/dL (ref 1.7–2.4)

## 2020-05-09 MED ORDER — LOSARTAN POTASSIUM 50 MG PO TABS
25.0000 mg | ORAL_TABLET | Freq: Every day | ORAL | Status: DC
  Administered 2020-05-09 – 2020-05-10 (×2): 25 mg via ORAL
  Filled 2020-05-09 (×2): qty 1

## 2020-05-09 NOTE — Progress Notes (Addendum)
PROGRESS NOTE   Antonio Cobb  [id number] DOB: 1961/01/26 DOA: 05/08/2020 PCP: [name], MD   Chief Complaint  Patient presents with   Shortness of Breath    Brief Admission History:  59 y.o. male with medical history significant of ischemic cardiomyopathy with EF 25% and implanted defibrillator for last 3 years reports that he woke up with wheezing and coughing with shortness of breath.  He was admitted with CHF exacerbation.    Assessment & Plan:   Principal Problem:   Acute respiratory failure with hypoxia (HCC) Active Problems:   Insulin-requiring or dependent type II diabetes mellitus ([place])   Mixed hyperlipidemia   Overweight with body mass index (BMI) of 29 to 29.9 in adult   Essential hypertension   Arteriosclerotic cardiovascular disease (ASCVD)   Abnormal echocardiogram   AKI (acute kidney injury) ([place])   DM type 2 causing vascular disease ([place])   Personal history of noncompliance with medical treatment, presenting hazards to health   Chronic systolic (congestive) heart failure (HCC)   Flash pulmonary edema (HCC)   CAD (coronary artery disease)- s/p STEMI with DES to Lt Cir in 2013/S/p AICD 01/2018   Ischemic cardiomyopathy with implantable cardioverter-defibrillator (ICD)/s/p STEMI with DES to Lt Cir in 2013/S/p AICD 01/2018   Dyspnea   OSA (obstructive sleep apnea)    Acute systolic CHF (congestive heart failure) (HCC)  Acute respiratory failure with hypoxia - much improved, he is off bipap and on supplemental nasal cannula.   Acute systolic and diastolic heart failure exacerbation - Pt remains dependent on IV lasix for diuresis.    He has diuresed at least 3 L and feeling better but remains volume overloaded as evidenced by CXR.  Cardiology consult appreciated.  2D echo with findings of EF 20% which is down from prior study.  Restarted on losartan per cardiology.  AKI on CKD stage IIIa - creatinine improved with diuresis.  Follow.  Type 2 DM with  vascular disease - poorly controlled, increased insulin doses, follow and adjust as needed.  Essential hypertension - poorly controlled, resumed losartan.  Hyperlipidemia - poorly controlled, wonder if he is taking crestor 40 mg regularly?  It has been restarted in hospital.  OSA - CPAP in hospital and patient reports insurance has approved his home CPAP and he will receive it after he is discharged.    DVT prophylaxis:  Enoxaparin Code Status:  Full  Family Communication:  wife Disposition:   Status is: Inpatient  Remains inpatient appropriate because:IV treatments appropriate due to intensity of illness or inability to take PO   Dispo: The patient is from: Home              Anticipated d/c is to: Home              Anticipated d/c date is: 1 day              Patient currently is not medically stable to d/c.  Consultants:  cardiology  Procedures:  2D Echocardiogram  IMPRESSIONS   1. Left ventricular ejection fraction, by estimation, is 20%. The left  ventricle has severely decreased function. The left ventricle demonstrates  regional wall motion abnormalities (see scoring diagram/findings for  description). The left ventricular  internal cavity size was mildly dilated. There is mild left ventricular  hypertrophy. Left ventricular diastolic parameters are consistent with  Grade II diastolic dysfunction (pseudonormalization).   2. Right ventricular systolic function is normal. The right ventricular  size is normal. There  is normal pulmonary artery systolic pressure. The  estimated right ventricular systolic pressure is Q000111Q mmHg.   3. Left atrial size was upper normal.   4. The mitral valve is grossly normal. Mild mitral valve regurgitation.   5. The aortic valve is tricuspid. Aortic valve regurgitation is trivial.   6. The inferior vena cava is dilated in size with >50% respiratory  variability, suggesting right atrial pressure of 8 mmHg.   Antimicrobials:     Subjective: Pt sitting up in chair and breathing better than yesterday  Objective: Vitals:   05/09/20 0316 05/09/20 0652 05/09/20 1000 05/09/20 1619  BP:  (!) 144/91 (!) 157/99 (!) 140/91  Pulse:  82 88 79  Resp:  16 18 18   Temp:  97.6 F (36.4 C) 97.9 F (36.6 C) 98.3 F (36.8 C)  TempSrc:  Oral Oral Oral  SpO2:  98% 100% 99%  Weight: 93.3 kg     Height:        Intake/Output Summary (Last 24 hours) at 05/09/2020 1741 Last data filed at 05/09/2020 0915 Gross per 24 hour  Intake --  Output 2350 ml  Net -2350 ml   Filed Weights   05/08/20 0641 05/09/20 0316  Weight: 94 kg 93.3 kg    Examination:  General exam: Appears calm and comfortable  Respiratory system: bibasilar crackles.  Respiratory effort normal. Cardiovascular system: normal S1 & S2 heard, RRR. Mild JVD, trace pedal edema. Gastrointestinal system: Abdomen is nondistended, soft and nontender. No organomegaly or masses felt. Normal bowel sounds heard. Central nervous system: Alert and oriented. No focal neurological deficits. Extremities: Symmetric 5 x 5 power. Skin: No rashes, lesions or ulcers Psychiatry: Judgement and insight appear normal. Mood & affect appropriate.   Data Reviewed: I have personally reviewed following labs and imaging studies  CBC: Recent Labs  Lab 05/08/20 0643  WBC 9.2  NEUTROABS 3.8  HGB 15.5  HCT 49.0  MCV 86.6  PLT 0000000    Basic Metabolic Panel: Recent Labs  Lab 05/08/20 0643 05/09/20 0713  NA 135 139  K 3.3* 3.4*  CL 96* 97*  CO2 24 29  GLUCOSE 431* 213*  BUN 28* 27*  CREATININE 1.58* 1.30*  CALCIUM 8.8* 9.3  MG  --  2.2    GFR: Estimated Creatinine Clearance: 70.2 mL/min (A) (by C-G formula based on SCr of 1.3 mg/dL (H)).  Liver Function Tests: Recent Labs  Lab 05/08/20 0643 05/09/20 0713  AST 25 21  ALT 28 24  ALKPHOS 101 86  BILITOT 1.1 1.4*  PROT 7.8 7.4  ALBUMIN 3.8 3.6    CBG: Recent Labs  Lab 05/09/20 0241 05/09/20 0811  05/09/20 0912 05/09/20 1119 05/09/20 1652  GLUCAP 228* 206* 224* 289* 95    Recent Results (from the past 240 hour(s))  SARS Coronavirus 2 by RT PCR (hospital order, performed in [facility] hospital lab) Nasopharyngeal Nasopharyngeal Swab     Status: None   Collection Time: 05/08/20  6:42 AM   Specimen: Nasopharyngeal Swab  Result Value Ref Range Status   SARS Coronavirus 2 NEGATIVE NEGATIVE Final    Comment: (NOTE) SARS-CoV-2 target nucleic acids are NOT DETECTED. The SARS-CoV-2 RNA is generally detectable in upper and lower respiratory specimens during the acute phase of infection. The lowest concentration of SARS-CoV-2 viral copies this assay can detect is 250 copies / mL. A negative result does not preclude SARS-CoV-2 infection and should not be used as the sole basis for treatment or other patient management decisions.  A negative result may occur with improper specimen collection / handling, submission of specimen other than nasopharyngeal swab, presence of viral mutation(s) within the areas targeted by this assay, and inadequate number of viral copies (<250 copies / mL). A negative result must be combined with clinical observations, patient history, and epidemiological information. Fact Sheet for Patients:   StrictlyIdeas.no Fact Sheet for Healthcare Providers: BankingDealers.co.za This test is not yet approved or cleared  by the [place] FDA and has been authorized for detection and/or diagnosis of SARS-CoV-2 by FDA under an Emergency Use Authorization (EUA).  This EUA will remain in effect (meaning this test can be used) for the duration of the COVID-19 declaration under Section 564(b)(1) of the Act, 21 U.S.C. section 360bbb-3(b)(1), unless the authorization is terminated or revoked sooner. Performed at [facility], [address] [place], [place] [address]      Radiology Studies: DG Chest [facility] 1 View  Result  Date: 05/09/2020 CLINICAL DATA:  Acute respiratory failure and acute systolic CHF EXAM: PORTABLE CHEST 1 VIEW COMPARISON:  Radiograph 05/08/2020 FINDINGS: Pacer pack overlies the left chest wall with lead in stable position directed towards the cardiac apex. Increasing interstitial opacities with central vascular congestion. No focal consolidation, pneumothorax or visible effusion. Cardiomediastinal contours are unchanged from prior. No acute osseous or soft tissue abnormality. IMPRESSION: Increasing interstitial opacities with central vascular congestion could reflect developing edema or less likely infection. Electronically Signed   By: Lovena Le M.D.   On: 05/09/2020 04:56   DG Chest Port 1 View  Result Date: 05/08/2020 CLINICAL DATA:  Acute onset shortness of breath EXAM: PORTABLE CHEST 1 VIEW COMPARISON:  03/27/2020 FINDINGS: Normal heart size for technique. Single chamber ICD lead into the right ventricle. Linear scar-like opacity at the right apex. Interstitial prominence which is greater than before. No visible effusion or pneumothorax. IMPRESSION: Interstitial prominence which could be bronchitic or congestive. Electronically Signed   By: Monte Fantasia M.D.   On: 05/08/2020 07:19   ECHOCARDIOGRAM COMPLETE  Result Date: 05/09/2020    ECHOCARDIOGRAM REPORT   Patient Name:   Antonio Cobb [place] Date of Exam: 05/09/2020 Medical Rec #:  [id number]          Height:       70.0 in Accession #:    [id number]         Weight:       205.7 lb Date of Birth:  17-Feb-1961          BSA:          2.112 m Patient Age:    6 years           BP:           157/99 mmHg Patient Gender: M                  HR:           88 bpm. Exam Location:  [facility] Procedure: 2D Echo, Cardiac Doppler and Color Doppler Indications:    CHF  History:        Patient has prior history of Echocardiogram examinations, most                 recent 11/13/2019. CHF, CAD and Previous Myocardial Infarction,                 Defibrillator, COPD,  Mitral Valve Disease; Risk  Factors:Hypertension, Dyslipidemia and Diabetes. Alcohol abuse.  Sonographer:    Dustin Flock RDCS Referring Phys: [place]  1. Left ventricular ejection fraction, by estimation, is 20%. The left ventricle has severely decreased function. The left ventricle demonstrates regional wall motion abnormalities (see scoring diagram/findings for description). The left ventricular internal cavity size was mildly dilated. There is mild left ventricular hypertrophy. Left ventricular diastolic parameters are consistent with Grade II diastolic dysfunction (pseudonormalization).  2. Right ventricular systolic function is normal. The right ventricular size is normal. There is normal pulmonary artery systolic pressure. The estimated right ventricular systolic pressure is Q000111Q mmHg.  3. Left atrial size was upper normal.  4. The mitral valve is grossly normal. Mild mitral valve regurgitation.  5. The aortic valve is tricuspid. Aortic valve regurgitation is trivial.  6. The inferior vena cava is dilated in size with >50% respiratory variability, suggesting right atrial pressure of 8 mmHg. FINDINGS  Left Ventricle: Left ventricular ejection fraction, by estimation, is 20%. The left ventricle has severely decreased function. The left ventricle demonstrates regional wall motion abnormalities. The left ventricular internal cavity size was mildly dilated. There is mild left ventricular hypertrophy. Left ventricular diastolic parameters are consistent with Grade II diastolic dysfunction (pseudonormalization).  LV Wall Scoring: The antero-lateral wall, inferior wall, and posterior wall are akinetic. Right Ventricle: The right ventricular size is normal. No increase in right ventricular wall thickness. Right ventricular systolic function is normal. There is normal pulmonary artery systolic pressure. The tricuspid regurgitant velocity is 2.51 m/s, and  with an assumed  right atrial pressure of 8 mmHg, the estimated right ventricular systolic pressure is Q000111Q mmHg. Left Atrium: Left atrial size was upper normal. Right Atrium: Right atrial size was normal in size. Pericardium: There is no evidence of pericardial effusion. Mitral Valve: The mitral valve is grossly normal. Mild mitral annular calcification. Mild mitral valve regurgitation. Tricuspid Valve: The tricuspid valve is grossly normal. Tricuspid valve regurgitation is trivial. Aortic Valve: The aortic valve is tricuspid. Aortic valve regurgitation is trivial. Mild aortic valve annular calcification. Pulmonic Valve: The pulmonic valve was grossly normal. Pulmonic valve regurgitation is trivial. Aorta: The aortic root is normal in size and structure. Venous: The inferior vena cava is dilated in size with greater than 50% respiratory variability, suggesting right atrial pressure of 8 mmHg. IAS/Shunts: No atrial level shunt detected by color flow Doppler. Additional Comments: A pacer wire is visualized.  LEFT VENTRICLE PLAX 2D LVIDd:         6.28 cm  Diastology LVIDs:         5.73 cm  LV e' lateral:   5.87 cm/s LV PW:         1.35 cm  LV E/e' lateral: 22.0 LV IVS:        1.46 cm  LV e' medial:    6.64 cm/s LVOT diam:     2.60 cm  LV E/e' medial:  19.4 LV SV:         90 LV SV Index:   42 LVOT Area:     5.31 cm  RIGHT VENTRICLE RV Basal diam:  3.24 cm RV S prime:     7.29 cm/s TAPSE (M-mode): 2.3 cm LEFT ATRIUM             Index       RIGHT ATRIUM           Index LA diam:        3.85 cm 1.82 cm/m  RA Area:     10.20 cm LA Vol (A2C):   43.8 ml 20.74 ml/m RA Volume:   18.00 ml  8.52 ml/m LA Vol (A4C):   65.8 ml 31.15 ml/m LA Biplane Vol: 57.1 ml 27.03 ml/m  AORTIC VALVE LVOT Vmax:   104.00 cm/s LVOT Vmean:  60.300 cm/s LVOT VTI:    0.169 m  AORTA Ao Root diam: 3.50 cm MITRAL VALVE                TRICUSPID VALVE MV Area (PHT): 5.23 cm     TR Peak grad:   25.2 mmHg MV Decel Time: 145 msec     TR Vmax:        251.00 cm/s MV E  velocity: 129.00 cm/s MV A velocity: 56.30 cm/s   SHUNTS MV E/A ratio:  2.29         Systemic VTI:  0.17 m                             Systemic Diam: 2.60 cm Rozann Lesches MD Electronically signed by Rozann Lesches MD Signature Date/Time: 05/09/2020/3:57:34 PM    Final      Scheduled Meds:  aspirin EC  81 mg Oral Q breakfast   busPIRone  10 mg Oral TID   docusate sodium  100 mg Oral Daily   enoxaparin (LOVENOX) injection  40 mg Subcutaneous Q24H   furosemide  60 mg Intravenous Q12H   insulin aspart  0-5 Units Subcutaneous QHS   insulin aspart  0-9 Units Subcutaneous TID WC   insulin aspart  14 Units Subcutaneous TID WC   insulin glargine  50 Units Subcutaneous QPM   loratadine  10 mg Oral Daily   losartan  25 mg Oral Daily   metoprolol succinate  50 mg Oral Daily   multivitamin with minerals  1 tablet Oral Daily   potassium chloride SA  20 mEq Oral BID   rosuvastatin  40 mg Oral QPM   sodium chloride flush  3 mL Intravenous Q12H   spironolactone  12.5 mg Oral Daily   Continuous Infusions:  sodium chloride       LOS: 1 day   Time spent: 23 mins   Oleta Gunnoe Wynetta Emery, MD How to contact the [facility] Attending or Consulting provider [place] or covering provider during after hours 7P -7A, for this patient?  Check the care team in [facility] and look for a) attending/consulting TR[facility] provider listed and b) the [facility] team listed Log into [email or web address] and use [place]'s universal password to access. If you do not have the password, please contact the hospital operator. Locate the [facility] provider you are looking for under [facility] and page to a number that you can be directly reached. If you still have difficulty reaching the provider, please page the [employer] (Director on Call) for the Hospitalists listed on amion for assistance.  05/09/2020, 5:41 PM

## 2020-05-09 NOTE — TOC Initial Note (Signed)
Transition of Care [facility]) - Initial/Assessment Note    Patient Details  Name: Antonio Cobb MRN: [id number] Date of Birth: Oct 28, 1961  Transition of Care [facility]) CM/SW Contact:    Natasha Bence, LCSW Phone Number: 05/09/2020, 11:52 AM  Clinical Narrative:                 Patient well known to [employer]. Readmission for CHF. Patient reported that he takes his medication as prescribed and follows a healthy diet.  Expected Discharge Plan: Home/Self Care Barriers to Discharge: Continued Medical Work up   Patient Goals and CMS Choice        Expected Discharge Plan and Services Expected Discharge Plan: Home/Self Care       Living arrangements for the past 2 months: Single Family Home                                      Prior Living Arrangements/Services Living arrangements for the past 2 months: Single Family Home Lives with:: Spouse Patient language and need for interpreter reviewed:: Yes Do you feel safe going back to the place where you live?: Yes      Need for Family Participation in Patient Care: Yes (Comment) Care giver support system in place?: Yes (comment)   Criminal Activity/Legal Involvement Pertinent to Current Situation/Hospitalization: No - Comment as needed  Activities of Daily Living Home Assistive Devices/Equipment: None ADL Screening (condition at time of admission) Patient's cognitive ability adequate to safely complete daily activities?: Yes Is the patient deaf or have difficulty hearing?: No Does the patient have difficulty seeing, even when wearing glasses/contacts?: No Does the patient have difficulty concentrating, remembering, or making decisions?: No Patient able to express need for assistance with ADLs?: Yes Does the patient have difficulty dressing or bathing?: No Independently performs ADLs?: Yes (appropriate for developmental age) Does the patient have difficulty walking or climbing stairs?: No Weakness of Legs: None Weakness of  Arms/Hands: None  Permission Sought/Granted                  Emotional Assessment     Affect (typically observed): Accepting, Pleasant Orientation: : Oriented to Self, Oriented to Place, Oriented to  Time, Oriented to Situation Alcohol / Substance Use: Alcohol Use Psych Involvement: No (comment)  Admission diagnosis:  Hypoxia [R09.02] Acute respiratory failure with hypoxia (HCC) 0000000 Acute systolic CHF (congestive heart failure) (HCC) [I50.21] Acute on chronic congestive heart failure, unspecified heart failure type [facility]) [I50.9] Patient Active Problem List   Diagnosis Date Noted  . Acute respiratory failure with hypoxia ([place]) 05/08/2020  . Acute systolic CHF (congestive heart failure) ([place]) 05/08/2020  . Environmental and seasonal allergies 03/27/2020  . OSA (obstructive sleep apnea)  01/24/2020  . Dyspnea 01/21/2020  . Encounter for support and coordination of transition of care 11/30/2019  . CAD (coronary artery disease)- s/p STEMI with DES to Lt Cir in 2013/S/p AICD 01/2018 11/19/2019  . Ischemic cardiomyopathy with implantable cardioverter-defibrillator (ICD)/s/p STEMI with DES to Lt Cir in 2013/S/p AICD 01/2018 11/19/2019  . Anxiety attack/panic attacks 11/19/2019  . Flash pulmonary edema ([place]) 11/13/2019  . Uncontrolled type 1 diabetes mellitus with hyperglycemia ([place]) 10/11/2019  . Pulmonary edema 04/05/2019  . Muscle pain 02/28/2018  . Chronic systolic (congestive) heart failure ([place]) 02/01/2018  . DM type 2 causing vascular disease ([place]) 10/18/2017  . Personal history of noncompliance with medical treatment, presenting hazards to health  10/18/2017  . Lumbar degenerative disc disease 06/20/2017  . Upper airway cough syndrome 05/27/2017  . Chronic systolic heart failure ([place]) 11/01/2016  . Acute respiratory failure with hypoxia and hypercapnia (HCC)   . AKI (acute kidney injury) ([place]) 10/07/2016  . Prolonged QT interval 10/07/2016  . Coronary artery disease  involving coronary bypass graft of native heart with angina pectoris ([place])   . Abnormal echocardiogram   . Allergic rhinitis 05/06/2015  . Cough 05/10/2013  . Alcohol abuse 05/23/2012  . Arteriosclerotic cardiovascular disease (ASCVD) 05/23/2012  . ED (erectile dysfunction) 05/10/2012  . Overweight with body mass index (BMI) of 29 to 29.9 in adult 08/12/2010  . Insulin-requiring or dependent type II diabetes mellitus ([place]) 03/08/2008  . Mixed hyperlipidemia 03/08/2008  . Essential hypertension 03/08/2008   PCP:  [name], MD Pharmacy:   [facility] [place], [place] [facility] [place] [id number] [place] [address] Phone: [phone] Fax: [phone]     Social Determinants of Health (SDOH) Interventions    Readmission Risk Interventions Readmission Risk Prevention Plan 11/13/2019 04/05/2019  Medication Screening Complete Complete  Transportation Screening Complete Complete  Some recent data might be hidden

## 2020-05-09 NOTE — Consult Note (Addendum)
Cardiology Consult    Patient ID: MICH AMEDEE; KH:[id number]; [birth year]   Admit date: 05/08/2020 Date of Consult: 05/09/2020  Primary Care Provider: [name], MD Primary Cardiologist: [name], MD  Primary Electrophysiologist: Dr. Lovena Le  Patient Profile    Antonio Cobb is a 59 y.o. male with past medical history of CAD (s/p STEMI in 2013 with DES to LCx, patent by repeat cath in 05/2016 with moderate residual CAD along RCA and LAD), chronic combined systolic and diastolic CHF (EF [id number] by echo in 09/2016, 25-30% in 08/2017 and 25% by most recent echo in 10/2019, s/p ICD placement in 01/2018), HTN, HLD who is being seen today for the evaluation of CHF at the request of Dr. Wynetta Emery.   History of Present Illness    Mr. Steensma was last examined by Dr. Harl Bowie in 02/2020 and denied any chest pain or dyspnea at that time. Had been referred to Pulmonology during his last admission due to nocturnal flash pulmonary edema felt to be associated with untreated OSA. He was previously intolerant to [facility] due to dizziness and coughing, therefore he was continued on Losartan 25mg  daily, Toprol-XL 50mg  daily and Torsemide 40mg  daily with Spironolactone 12.5mg  daily being restarted as this was discontinued during his prior admission due to hypotension.   He presented to [facility] ED on 05/08/2020 for evaluation of worsening dyspnea as oxygen saturations were at 72% by EMS arrival. He was given nebulizer treatments and received IV Lasix 80mg  while in the ED. Initially required BiPAP but was later transitioned to [place].  He reports that his breathing had overall been stable for the past few days but he developed acute dyspnea while walking into work yesterday morning. Says he felt very weak and became diaphoretic. He tried driving himself back home but had to pull over at the truck stop due to worsening symptoms. He denies any associated chest pain or palpitations. Reports his  breathing had overall been stable for the past several weeks but he was having more frequent coughing at night. He also reports worsening fatigue during the early morning hours but symptoms would improve once he was at work and being active. He is still awaiting approval for his home CPAP.  Initial labs show WBC 9.2, Hgb 15.5, platelets 258, Na+ 135, K+ 3.3 and creatinine 1.58 (baseline 1.2 - 1.3). BNP 351. Initial HS Troponin 25 with repeat of 41. CXR showed interstitial prominence consistent with bronchitis or CHF. EKG shows sinus tachycardia, HR 112 with LVH and nonspecific ST abnormality along the lateral leads.   He was started on IV Lasix 60mg  BID. Repeat labs this AM show K+ at 3.4 but creatinine has improved to 1.30. Output recorded as -2.8L thus far and weight at 205 lbs (previously 209 lbs on the office scales in 02/2020). He reports an additional 1L output this AM.    Past Medical History:  Diagnosis Date  . Acute CHF (congestive heart failure) ([place]) 01/21/2020  . Acute on chronic combined systolic and diastolic CHF (congestive heart failure) ([place])   . Alcohol abuse   . CAD (coronary artery disease)    a. s/p inferolateral STEMI in 05/2012 with DES to LCx b. patent stent by cath in 05/2016 with moderate RCA and LAD disease  . Cardiomyopathy ([place])    a. LVEF 30-35% by echo in 09/2016. b. 08/2017: echo showing EF remains reduced at 25-30% c. s/p [employer] ICD placement in 01/2018  . CHF (congestive heart failure) ([place])   .  CHF exacerbation ([place]) 11/18/2019  . COPD (chronic obstructive pulmonary disease) ([place])   . Diabetes mellitus, type 2 ([place])   . Essential hypertension   . Hypertensive urgency   . Mitral regurgitation    Moderate  . Mixed hyperlipidemia   . Myocardial infarction ([place]) 05/23/2012   Inferolateral STEMI  . Obesity     Past Surgical History:  Procedure Laterality Date  . CARDIAC CATHETERIZATION  2 yrs ago  . CARDIAC CATHETERIZATION N/A 05/15/2016    Procedure: Right/Left Heart Cath and Coronary Angiography;  Surgeon: [name], MD;  Location: [place] CV LAB;  Service: Cardiovascular;  Laterality: N/A;  . CORONARY STENT PLACEMENT  05/23/12  . ICD IMPLANT N/A 02/01/2018   Procedure: ICD IMPLANT;  Surgeon: Evans Lance, MD;  Location: [place] CV LAB;  Service: Cardiovascular;  Laterality: N/A;  . LEFT HEART CATHETERIZATION WITH CORONARY ANGIOGRAM N/A 05/23/2012   Procedure: LEFT HEART CATHETERIZATION WITH CORONARY ANGIOGRAM;  Surgeon: [name], MD;  Location: [facility] CATH LAB;  Service: Cardiovascular;  Laterality: N/A;  . None    . PERCUTANEOUS CORONARY STENT INTERVENTION (PCI-S) N/A 05/23/2012   Procedure: PERCUTANEOUS CORONARY STENT INTERVENTION (PCI-S);  Surgeon: [name], MD;  Location: [facility] CATH LAB;  Service: Cardiovascular;  Laterality: N/A;  . POLYPECTOMY  10/16/2011   Procedure: POLYPECTOMY;  Surgeon: [name], MD;  Location: [facility] ORS;  Service: Endoscopy;;  Polypoid Lesion, Transverse and Sigmoid Colon     Home Medications:  Prior to Admission medications   Medication Sig Start Date End Date Taking? Authorizing Provider  albuterol (VENTOLIN HFA) 108 (90 Base) MCG/ACT inhaler Inhale 2 puffs into the lungs every 6 (six) hours as needed for wheezing or shortness of breath. 11/20/19  Yes Roxan Hockey, MD  aspirin EC 81 MG EC tablet Take 1 tablet (81 mg total) by mouth daily with breakfast. 11/20/19  Yes Emokpae, Courage, MD  busPIRone (BUSPAR) 10 MG tablet Take 1 tablet (10 mg total) by mouth 3 (three) times daily. For anxiety 11/20/19  Yes Emokpae, Courage, MD  docusate sodium (COLACE) 100 MG capsule Take 100 mg by mouth daily.   Yes [provider]  famotidine (PEPCID) 20 MG tablet Take 1 tablet (20 mg total) by mouth at bedtime. 01/25/20  Yes Tat, Shanon Brow, MD  glucose blood (FREESTYLE LITE) test strip TEST FOUR TIMES DAILY 10/05/19  Yes [name], MD  hydrOXYzine (ATARAX/VISTARIL) 25 MG  tablet Take one tablet by mouth at bedtime, for anxiety and sleep 04/09/20  Yes [name], MD  insulin regular (NOVOLIN R,HUMULIN R) 100 units/mL injection Inject 0.1 mLs (10 Units total) into the skin 3 (three) times daily before meals. 03/27/19  Yes Nida, Marella Chimes, MD  LANTUS SOLOSTAR 100 UNIT/ML Solostar Pen ADMINISTER 50 UNITS UNDER THE SKIN EVERY NIGHT AT BEDTIME Patient taking differently: Inject 50 Units into the skin every evening.  08/14/19  Yes Nida, Marella Chimes, MD  loratadine (CLARITIN) 10 MG tablet Take 1 tablet (10 mg total) by mouth daily. 03/27/20  Yes Perlie Mayo, NP  losartan (COZAAR) 25 MG tablet Take 1 tablet (25 mg total) by mouth daily. 03/27/20  Yes Perlie Mayo, NP  Melatonin 10 MG TABS Take 2 tablets by mouth at bedtime.   Yes [provider]  metoprolol succinate (TOPROL-XL) 50 MG 24 hr tablet TAKE 1 TABLET BY MOUTH EVERY DAY WITH FOOD Patient taking differently: Take 50 mg by mouth daily. TAKE 1 TABLET BY MOUTH EVERY DAY  WITH FOOD 11/20/19  Yes Emokpae, Courage, MD  Multiple Vitamins-Minerals (MULTIVITAMIN WITH MINERALS) tablet Take 1 tablet by mouth daily. 11/20/19 11/19/20 Yes Emokpae, Courage, MD  nitroGLYCERIN (NITROSTAT) 0.4 MG SL tablet Place 1 tablet (0.4 mg total) under the tongue every 5 (five) minutes as needed for chest pain. 05/29/16 05/08/20 Yes Lendon Colonel, NP  pantoprazole (PROTONIX) 40 MG tablet Take 1 tablet (40 mg total) by mouth daily. Take 30-85min before first meal of the day. 04/01/20  Yes Lauraine Rinne, NP  potassium chloride SA (KLOR-CON) 20 MEQ tablet Take 1 tablet (20 mEq total) by mouth daily. 11/20/19  Yes Emokpae, Courage, MD  rosuvastatin (CRESTOR) 40 MG tablet Take 1 tablet (40 mg total) by mouth daily. 11/20/19  Yes Emokpae, Courage, MD  sertraline (ZOLOFT) 25 MG tablet Take 1 tablet (25 mg total) by mouth daily. For anxiety/panic attacks 04/09/20 04/09/21 Yes [name], MD  spironolactone (ALDACTONE) 25  MG tablet Take 0.5 tablets (12.5 mg total) by mouth daily. 02/27/20  Yes BranchAlphonse Guild, MD  torsemide (DEMADEX) 20 MG tablet Take 2 tablets (40 mg total) by mouth daily. 01/26/20  Yes Tat, Shanon Brow, MD  cromolyn (NASALCROM) 5.2 MG/ACT nasal spray Place 1 spray into both nostrils 4 (four) times daily. Patient not taking: Reported on 05/08/2020 03/27/20   Perlie Mayo, NP    Inpatient Medications: Scheduled Meds: . aspirin EC  81 mg Oral Q breakfast  . busPIRone  10 mg Oral TID  . docusate sodium  100 mg Oral Daily  . enoxaparin (LOVENOX) injection  40 mg Subcutaneous Q24H  . furosemide  60 mg Intravenous Q12H  . insulin aspart  0-5 Units Subcutaneous QHS  . insulin aspart  0-9 Units Subcutaneous TID WC  . insulin aspart  14 Units Subcutaneous TID WC  . insulin glargine  50 Units Subcutaneous QPM  . loratadine  10 mg Oral Daily  . metoprolol succinate  50 mg Oral Daily  . multivitamin with minerals  1 tablet Oral Daily  . potassium chloride SA  20 mEq Oral BID  . rosuvastatin  40 mg Oral QPM  . sodium chloride flush  3 mL Intravenous Q12H  . spironolactone  12.5 mg Oral Daily   Continuous Infusions: . sodium chloride     PRN Meds: sodium chloride, acetaminophen, hydrOXYzine, melatonin, sodium chloride flush  Allergies:    Allergies  Allergen Reactions  . Entresto [Sacubitril-Valsartan]     Dizziness, Coughing  . Other     Seasonal allergies  - has to use inhaler    Social History:   Social History   Socioeconomic History  . Marital status: Married    Spouse name: Not on file  . Number of children: Not on file  . Years of education: Not on file  . Highest education level: Not on file  Occupational History  . Occupation: Therapist, sports: [employer]  Tobacco Use  . Smoking status: Former Smoker    Packs/day: 0.25    Years: 30.00    Pack years: 7.50    Types: Cigars    Quit date: 05/23/2016    Years since quitting: 3.9  . Smokeless tobacco: Never  Used  . Tobacco comment: since early 22s  Substance and Sexual Activity  . Alcohol use: Yes    Alcohol/week: 0.0 standard drinks    Comment: occ  . Drug use: No  . Sexual activity: Not Currently  Other Topics Concern  . Not  on file  Social History Narrative  . Not on file   Social Determinants of Health   Financial Resource Strain:   . Difficulty of Paying Living Expenses:   Food Insecurity:   . Worried About Charity fundraiser in the Last Year:   . Arboriculturist in the Last Year:   Transportation Needs:   . Film/video editor (Medical):   [name] Lack of Transportation (Non-Medical):   Physical Activity:   . Days of Exercise per Week:   . Minutes of Exercise per Session:   Stress:   . Feeling of Stress :   Social Connections:   . Frequency of Communication with Friends and Family:   . Frequency of Social Gatherings with Friends and Family:   . Attends Religious Services:   . Active Member of Clubs or Organizations:   . Attends Archivist Meetings:   [name] Marital Status:   Intimate Partner Violence:   . Fear of Current or Ex-Partner:   . Emotionally Abused:   [name] Physically Abused:   . Sexually Abused:      Family History:    Family History  Problem Relation Age of Onset  . Emphysema Mother   . Heart attack Mother   . Hypertension Mother   . Diabetes Mother   . Heart disease Mother   . COPD Mother   . Emphysema Father   . COPD Father   . Stroke Sister   . Asthma Sister   . Colon cancer Neg Hx   . Anesthesia problems Neg Hx   . Hypotension Neg Hx   . Malignant hyperthermia Neg Hx   . Pseudochol deficiency Neg Hx       Review of Systems    General:  No chills, fever, night sweats or weight changes.  Cardiovascular:  No chest pain, edema, orthopnea, palpitations, paroxysmal nocturnal dyspnea. Positive for dyspnea on exertion. Dermatological: No rash, lesions/masses Respiratory: No cough, dyspnea Urologic: No hematuria, dysuria Abdominal:   No  nausea, vomiting, diarrhea, bright red blood per rectum, melena, or hematemesis Neurologic:  No visual changes, wkns, changes in mental status. All other systems reviewed and are otherwise negative except as noted above.  Physical Exam/Data    Vitals:   05/09/20 0124 05/09/20 0316 05/09/20 0652 05/09/20 1000  BP: 131/85  (!) 144/91 (!) 157/99  Pulse: 85  82 88  Resp: 16  16 18   Temp: 98 F (36.7 C)  97.6 F (36.4 C) 97.9 F (36.6 C)  TempSrc: Oral  Oral Oral  SpO2: 100%  98% 100%  Weight:  93.3 kg    Height:        Intake/Output Summary (Last 24 hours) at 05/09/2020 1027 Last data filed at 05/09/2020 [id number] Gross per 24 hour  Intake --  Output 1900 ml  Net -1900 ml   Filed Weights   05/08/20 0641 05/09/20 0316  Weight: 94 kg 93.3 kg   Body mass index is 29.51 kg/m.   General: Pleasant male appearing in NAD Psych: Normal affect. Neuro: Alert and oriented X 3. Moves all extremities spontaneously. HEENT: Normal  Neck: Supple without bruits or JVD. Lungs:  Resp regular and unlabored, rales along bases bilaterally. Heart: RRR no s3, s4, or murmurs. Abdomen: Soft, non-tender, non-distended, BS + x 4.  Extremities: No clubbing, cyanosis or lower extremity edema. DP/PT/Radials 2+ and equal bilaterally.   EKG:  The EKG was personally reviewed and demonstrates: Sinus tachycardia, HR 112 with LVH and nonspecific  ST abnormality along the lateral leads.   Telemetry:  Telemetry was personally reviewed and demonstrates: NSR, HR in 70's to 80's. Occasional PVC's and episodes of ventricular trigeminy.    Labs/Studies     Relevant CV Studies:  Echocardiogram: 10/2019 IMPRESSIONS    1. Left ventricular ejection fraction, by visual estimation, is 25%. The  left ventricle has severely decreased function. There is mildly increased  left ventricular hypertrophy.  2. Multiple segmental abnormalities exist. See findings.  3. Left ventricular diastolic parameters are  indeterminate.  4. Moderately dilated left ventricular internal cavity size.  5. Global right ventricle has normal systolic function.The right  ventricular size is normal. No increase in right ventricular wall  thickness.  6. Left atrial size was normal.  7. Right atrial size was normal.  8. Presence of pericardial fat pad.  9. Mild to moderate aortic valve annular calcification.  10. The mitral valve is grossly normal. Mild mitral valve regurgitation.  11. The tricuspid valve is grossly normal. Tricuspid valve regurgitation  is trivial.  12. The aortic valve is tricuspid. Aortic valve regurgitation is trivial.  13. The pulmonic valve was grossly normal. Pulmonic valve regurgitation is  not visualized.  14. TR signal is inadequate for assessing pulmonary artery systolic  pressure.  15. A pacer wire is visualized.  16. The inferior vena cava is normal in size with greater than 50%  respiratory variability, suggesting right atrial pressure of 3 mmHg.   Laboratory Data:  Chemistry Recent Labs  Lab 05/08/20 0643 05/09/20 0713  NA 135 139  K 3.3* 3.4*  CL 96* 97*  CO2 24 29  GLUCOSE 431* 213*  BUN 28* 27*  CREATININE 1.58* 1.30*  CALCIUM 8.8* 9.3  GFRNONAA 47* 60*  GFRAA 55* >60  ANIONGAP 15 13    Recent Labs  Lab 05/08/20 0643 05/09/20 0713  PROT 7.8 7.4  ALBUMIN 3.8 3.6  AST 25 21  ALT 28 24  ALKPHOS 101 86  BILITOT 1.1 1.4*   Hematology Recent Labs  Lab 05/08/20 0643  WBC 9.2  RBC 5.66  HGB 15.5  HCT 49.0  MCV 86.6  MCH 27.4  MCHC 31.6  RDW 13.0  PLT 258   Cardiac EnzymesNo results for input(s): TROPONINI in the last 168 hours. No results for input(s): TROPIPOC in the last 168 hours.  BNP Recent Labs  Lab 05/08/20 0643 05/09/20 0713  BNP 531.0* 649.0*    DDimer No results for input(s): DDIMER in the last 168 hours.  Radiology/Studies:  DG Chest Port 1 View  Result Date: 05/09/2020 CLINICAL DATA:  Acute respiratory failure and acute  systolic CHF EXAM: PORTABLE CHEST 1 VIEW COMPARISON:  Radiograph 05/08/2020 FINDINGS: Pacer pack overlies the left chest wall with lead in stable position directed towards the cardiac apex. Increasing interstitial opacities with central vascular congestion. No focal consolidation, pneumothorax or visible effusion. Cardiomediastinal contours are unchanged from prior. No acute osseous or soft tissue abnormality. IMPRESSION: Increasing interstitial opacities with central vascular congestion could reflect developing edema or less likely infection. Electronically Signed   By: Lovena Le M.D.   On: 05/09/2020 04:56   DG Chest Port 1 View  Result Date: 05/08/2020 CLINICAL DATA:  Acute onset shortness of breath EXAM: PORTABLE CHEST 1 VIEW COMPARISON:  03/27/2020 FINDINGS: Normal heart size for technique. Single chamber ICD lead into the right ventricle. Linear scar-like opacity at the right apex. Interstitial prominence which is greater than before. No visible effusion or pneumothorax. IMPRESSION: Interstitial prominence which  could be bronchitic or congestive. Electronically Signed   By: Monte Fantasia M.D.   On: 05/08/2020 07:19     Assessment & Plan    1. Chronic Combined Systolic and Diastolic CHF - he has a reduced EF of 25% by most recent echocardiogram in 10/2019 and is s/p ICD placement in 01/2018.  - He presented with acute worsening dyspnea which started the morning of admission but reports he had been experiencing a dry cough and worsening weakness for the past few weeks. Weight has been stable on his home scales.  - currently receiving IV Lasix 60mg  BID with a recorded output of -2.8L thus far and an additional unrecorded 1L this AM. Given rales on examination and repeat CXR this morning showing an increase in vascular congestion, would continue with IV Lasix today. A repeat echocardiogram has been ordered by the admitting team to reassess LV function. - He remains on Toprol-XL and  Spironolactone. Can likely restart Losartan today or tomorrow. He was previously intolerant to [facility].  2. CAD - he is s/p STEMI in 2013 with DES to LCx, patent by repeat cath in 05/2016 with moderate residual CAD along RCA and LAD.  - HS Troponin values have been flat at 25 and 41 this admission. His breathing has improved with diuresis and he denies any chest pain.  - continue ASA, statin and BB therapy.   3. HTN - BP has been variable at 131/82 - 169/100 since admission. He has been continued on PTA Toprol-XL and Spironolactone. Losartan was held due to his AKI but this can likely be restarted today as his renal function is back to baseline.   4. HLD - Repeat FLP was obtained on admission with results pending. He remains on Crestor 40 mg daily with goal LDL less than 70.  5. Stage 3 CKD - baseline creatinine 1.2 - 1.3. Elevated to 1.58 on admission, improved to 1.30 this AM.   6. OSA - he reports his home CPAP was just approved by his insurance this week. Followed by Pulmonology as an outpatient.   For questions or updates, please contact [place] Please consult [email or web address] for contact info under Cardiology/STEMI.  Signed, Erma Heritage, PA-C 05/09/2020, 10:27 AM Pager: [phone]   Attending note:  Patient seen and examined.  I reviewed his records and discussed the case with Ms. Ahmed Prima PA-C.  Mr. Drabik presents with subacute to acute shortness of breath with acute on chronic combined heart failure and evidence of vascular congestion by chest x-ray.  No clear evidence of ACS with high-sensitivity troponin I levels in the 20-40 range.  He reports compliance with medical therapy at baseline, no major change in weight, but he does state that he has felt some "wheezes" recently.  He has had no palpitations or syncope, no device shocks with ICD in place.  He has not yet started on regular treatment of OSA with CPAP, equipment to be delivered soon.  On examination he  appears comfortable this morning, sitting in bedside chair.  Blood pressure elevated (losartan held on admission), sinus rhythm by telemetry which I personally reviewed.  Lungs exhibit decreased breath sounds at the bases with a few basilar crackles.  Cardiac exam with RRR no gallop.  Pertinent lab work includes potassium 3.4, creatinine 1.3, BNP 649, peak high-sensitivity troponin I 41, hemoglobin A1c 10.2%.  ECG shows sinus tachycardia with nonspecific ST-T changes.  Chest x-ray reports interstitial opacities with central vascular congestion.  Acute on chronic combined heart failure,  improving with IV diuresis.  Follow-up echocardiogram has been ordered and pending.  Would continue aspirin, Crestor, Toprol-XL, Aldactone, resume losartan.  Possibly convert to oral diuretics tomorrow with anticipated discharge.  Hopeful that he will have further clinical stabilization with regular use of CPAP at home.  Not entirely clear that further ischemic testing is warranted at this time.  Satira Sark, M.D., F.A.C.C.
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
## 2020-05-09 NOTE — Progress Notes (Signed)
Inpatient Diabetes Program Recommendations  AACE/ADA: New Consensus Statement on Inpatient Glycemic Control (2015)  Target Ranges:  Prepandial:   less than 140 mg/dL      Peak postprandial:   less than 180 mg/dL (1-2 hours)      Critically ill patients:  140 - 180 mg/dL   Lab Results  Component Value Date   GLUCAP 224 (H) 05/09/2020   HGBA1C 10.2 (H) 05/08/2020    Review of Glycemic Control  Diabetes history: DM 2 Outpatient Diabetes medications: Lantus 50 units qevening, Novolin R 10 units tid with meals Current orders for Inpatient glycemic control:  Lantus 50 units (increased 5/27) Novolog 0-9 units tid + hs Novolog 14 units tid  Lantus titrated this am.  Spoke with pt by phone regarding A1c and glucose control at home. Pt sees Dr. Dorris Fetch outpatient. Pt has not seen him in awhile for blood work but has had televisits. Informed pt of current A1c of 10.2%. Discussed glucose and A1c goals. Pt told me " that's way too high." pt reports checking his glucose tid. Pt does report glucose trends being higher lately. Pt also says he can do a lot better with his diet. Encouraged pt to also schedule follow up with Dr. Dorris Fetch. Discussed importance of glucose control on his heart health and vascular system. Pt thankful for information. Pt has all DM supplies at home.  Thanks,  Tama Headings RN, MSN, BC-ADM Inpatient Diabetes Coordinator Team Pager [phone] (8a-5p)

## 2020-05-09 NOTE — Progress Notes (Signed)
  Echocardiogram 2D Echocardiogram has been performed.  Antonio Cobb 05/09/2020, 3:35 PM

## 2020-05-09 NOTE — Progress Notes (Signed)
IV RN spoke with patient Rn regarding midline order. Currently per patient RN, patient does not have iv access and 3 of the unit nurses have attempted to place IV.  IV RN encouraged patient nurse to notify ER or ICU for further attempt at IV placement. Patient RN verbalized understanding and will reach out to ER/ICU nurses for possible IV placement.

## 2020-05-10 ENCOUNTER — Inpatient Hospital Stay (HOSPITAL_COMMUNITY): Payer: BC Managed Care – PPO

## 2020-05-10 DIAGNOSIS — J810 Acute pulmonary edema: Secondary | ICD-10-CM

## 2020-05-10 DIAGNOSIS — I5021 Acute systolic (congestive) heart failure: Secondary | ICD-10-CM | POA: Diagnosis not present

## 2020-05-10 DIAGNOSIS — J9601 Acute respiratory failure with hypoxia: Secondary | ICD-10-CM | POA: Diagnosis not present

## 2020-05-10 DIAGNOSIS — G4733 Obstructive sleep apnea (adult) (pediatric): Secondary | ICD-10-CM | POA: Diagnosis not present

## 2020-05-10 DIAGNOSIS — E119 Type 2 diabetes mellitus without complications: Secondary | ICD-10-CM

## 2020-05-10 DIAGNOSIS — Z794 Long term (current) use of insulin: Secondary | ICD-10-CM

## 2020-05-10 DIAGNOSIS — I5043 Acute on chronic combined systolic (congestive) and diastolic (congestive) heart failure: Secondary | ICD-10-CM | POA: Diagnosis not present

## 2020-05-10 DIAGNOSIS — N179 Acute kidney failure, unspecified: Secondary | ICD-10-CM | POA: Diagnosis not present

## 2020-05-10 DIAGNOSIS — R0602 Shortness of breath: Secondary | ICD-10-CM | POA: Diagnosis not present

## 2020-05-10 DIAGNOSIS — I10 Essential (primary) hypertension: Secondary | ICD-10-CM

## 2020-05-10 DIAGNOSIS — R931 Abnormal findings on diagnostic imaging of heart and coronary circulation: Secondary | ICD-10-CM | POA: Diagnosis not present

## 2020-05-10 LAB — COMPREHENSIVE METABOLIC PANEL
ALT: 25 U/L (ref 0–44)
AST: 24 U/L (ref 15–41)
Albumin: 3.7 g/dL (ref 3.5–5.0)
Alkaline Phosphatase: 86 U/L (ref 38–126)
Anion gap: 14 (ref 5–15)
BUN: 24 mg/dL — ABNORMAL HIGH (ref 6–20)
CO2: 24 mmol/L (ref 22–32)
Calcium: 9.2 mg/dL (ref 8.9–10.3)
Chloride: 100 mmol/L (ref 98–111)
Creatinine, Ser: 1.19 mg/dL (ref 0.61–1.24)
GFR calc Af Amer: >60 mL/min (ref >60)
GFR calc non Af Amer: >60 mL/min (ref >60)
Glucose, Bld: 162 mg/dL — ABNORMAL HIGH (ref 70–99)
Potassium: 3.6 mmol/L (ref 3.5–5.1)
Sodium: 138 mmol/L (ref 135–145)
Total Bilirubin: 1 mg/dL (ref 0.3–1.2)
Total Protein: 7.5 g/dL (ref 6.5–8.1)

## 2020-05-10 LAB — MAGNESIUM: Magnesium: 2.2 mg/dL (ref 1.7–2.4)

## 2020-05-10 LAB — BRAIN NATRIURETIC PEPTIDE: B Natriuretic Peptide: 514 pg/mL — ABNORMAL HIGH (ref 0.0–100.0)

## 2020-05-10 LAB — GLUCOSE, CAPILLARY
Glucose-Capillary: 124 mg/dL — ABNORMAL HIGH (ref 70–99)
Glucose-Capillary: 155 mg/dL — ABNORMAL HIGH (ref 70–99)
Glucose-Capillary: 242 mg/dL — ABNORMAL HIGH (ref 70–99)

## 2020-05-10 MED ORDER — TORSEMIDE 20 MG PO TABS: 60.0000 mg | ORAL_TABLET | Freq: Every day | ORAL | 1 refills | Status: DC

## 2020-05-10 NOTE — Discharge Summary (Addendum)
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 studies No results for input(s): TSH, T4TOTAL, T3FREE, THYROIDAB in the last 72 hours.  Invalid input(s): FREET3 Anemia work up No results for input(s): VITAMINB12, FOLATE, FERRITIN, TIBC, IRON, RETICCTPCT in the last 72 hours. Urinalysis    Component Value Date/Time   COLORURINE COLORLESS (A) 09/06/2017 0452   APPEARANCEUR CLEAR 09/06/2017 0452   LABSPEC 1.006 09/06/2017 0452   PHURINE 5.0 09/06/2017 0452   GLUCOSEU >=500 (A) 09/06/2017 0452   GLUCOSEU NEG mg/dL 03/04/2007 0829   HGBUR NEGATIVE 09/06/2017 0452    HGBUR negative 11/13/2010 1522   BILIRUBINUR NEGATIVE 09/06/2017 0452   KETONESUR NEGATIVE 09/06/2017 0452   PROTEINUR NEGATIVE 09/06/2017 0452   UROBILINOGEN 0.2 03/23/2015 1805   NITRITE NEGATIVE 09/06/2017 0452   LEUKOCYTESUR NEGATIVE 09/06/2017 0452   Sepsis Labs Invalid input(s): PROCALCITONIN,  WBC,  LACTICIDVEN Microbiology Recent Results (from the past 240 hour(s))  SARS Coronavirus 2 by RT PCR (hospital order, performed in [facility] hospital lab) Nasopharyngeal Nasopharyngeal Swab     Status: None   Collection Time: 05/08/20  6:42 AM   Specimen: Nasopharyngeal Swab  Result Value Ref Range Status   SARS Coronavirus 2 NEGATIVE NEGATIVE Final    Comment: (NOTE) SARS-CoV-2 target nucleic acids are NOT DETECTED. The SARS-CoV-2 RNA is generally detectable in upper and lower respiratory specimens during the acute phase of infection. The  lowest concentration of SARS-CoV-2 viral copies this assay can detect is 250 copies / mL. A negative result does not preclude SARS-CoV-2 infection and should not be used as the sole basis for treatment or other patient management decisions.  A negative result may occur with improper specimen collection / handling, submission of specimen other than nasopharyngeal swab, presence of viral mutation(s) within the areas targeted by this assay, and inadequate number of viral copies (<250 copies / mL). A negative result must be combined with clinical observations, patient history, and epidemiological information. Fact Sheet for Patients:   StrictlyIdeas.no Fact Sheet for Healthcare Providers: BankingDealers.co.za This test is not yet approved or cleared  by the [place] FDA and has been authorized for detection and/or diagnosis of SARS-CoV-2 by FDA under an Emergency Use Authorization (EUA).  This EUA will remain in effect (meaning this test can be used) for the duration of the COVID-19 declaration  under Section 564(b)(1) of the Act, 21 U.S.C. section 360bbb-3(b)(1), unless the authorization is terminated or revoked sooner. Performed at [facility], [address], [place], [place] [address]     Time coordinating discharge: 35 mins  SIGNED:  Irwin Brakeman, MD  [facility] 05/10/2020, 12:08 PM How to contact the [facility] Attending or Consulting provider [place] or covering provider during after hours [place], for this patient?  1. Check the care team in [facility] and look for a) attending/consulting [facility] provider listed and b) the [facility] team listed 2. Log into [email or web address] and use [place]'s universal password to access. If you do not have the password, please contact the hospital operator. 3. Locate the [facility] provider you are looking for under [facility] and page to a number that you can be directly reached. 4. If you still have difficulty reaching the provider, please page the [facility] (Director on Call) for the Hospitalists listed on amion for assistance.

## 2020-05-10 NOTE — Discharge Instructions (Signed)
Heart Failure Action Plan A heart failure action plan helps you understand what to do when you have symptoms of heart failure. Follow the plan that was created by you and your health care provider. Review your plan each time you visit your health care provider. Red zone These signs and symptoms mean you should get medical help right away:  You have trouble breathing when resting.  You have a dry cough that is getting worse.  You have swelling or pain in your legs or abdomen that is getting worse.  You suddenly gain more than 2-3 lb (0.9-1.4 kg) in a day, or more than 5 lb (2.3 kg) in one week. This amount may be more or less depending on your condition.  You have trouble staying awake or you feel confused.  You have chest pain.  You do not have an appetite.  You pass out. If you experience any of these symptoms:  Call your local emergency services (911 in the [place] right away or seek help at the emergency department of the nearest hospital. Yellow zone These signs and symptoms mean your condition may be getting worse and you should make some changes:  You have trouble breathing when you are active or you need to sleep with extra pillows.  You have swelling in your legs or abdomen.  You gain 2-3 lb (0.9-1.4 kg) in one day, or 5 lb (2.3 kg) in one week. This amount may be more or less depending on your condition.  You get tired easily.  You have trouble sleeping.  You have a dry cough. If you experience any of these symptoms:  Contact your health care provider within the next day.  Your health care provider may adjust your medicines. Green zone These signs mean you are doing well and can continue what you are doing:  You do not have shortness of breath.  You have very little swelling or no new swelling.  Your weight is stable (no gain or loss).  You have a normal activity level.  You do not have chest pain or any other new symptoms. Follow these instructions at  home:  Take over-the-counter and prescription medicines only as told by your health care provider.  Weigh yourself daily. Your target weight is __________ lb (__________ kg). ? Call your health care provider if you gain more than __________ lb (__________ kg) in a day, or more than __________ lb (__________ kg) in one week.  Eat a heart-healthy diet. Work with a diet and nutrition specialist (dietitian) to create an eating plan that is best for you.  Keep all follow-up visits as told by your health care provider. This is important. Where to find more information  American Heart Association: www.heart.org Summary  Follow the action plan that was created by you and your health care provider.  Get help right away if you have any symptoms in the Red zone. This information is not intended to replace advice given to you by your health care provider. Make sure you discuss any questions you have with your health care provider. Document Revised: 11/12/2017 Document Reviewed: 01/09/2017 Elsevier Patient Education  [place].   Heart Failure, Self Care Heart failure is a serious condition. This sheet explains things you need to do to take care of yourself at home. To help you stay as healthy as possible, you may be asked to change your diet, take certain medicines, and make other changes in your life. Your doctor may also give you more  specific instructions. If you have problems or questions, call your doctor. What are the risks? Having heart failure makes it more likely for you to have some problems. These problems can get worse if you do not take good care of yourself. Problems may include:  Blood clotting problems. This may cause a stroke.  Damage to the kidneys, liver, or lungs.  Abnormal heart rhythms. Supplies needed:  Scale for weighing yourself.  Blood pressure monitor.  Notebook.  Medicines. How to care for yourself when you have heart failure Medicines Take  over-the-counter and prescription medicines only as told by your doctor. Take your medicines every day.  Do not stop taking your medicine unless your doctor tells you to do so.  Do not skip any medicines.  Get your prescriptions refilled before you run out of medicine. This is important. Eating and drinking   Eat heart-healthy foods. Talk with a diet specialist (dietitian) to create an eating plan.  Choose foods that: ? Have no trans fat. ? Are low in saturated fat and cholesterol.  Choose healthy foods, such as: ? Fresh or frozen fruits and vegetables. ? Fish. ? Low-fat (lean) meats. ? Legumes, such as beans, peas, and lentils. ? Fat-free or low-fat dairy products. ? Whole-grain foods. ? High-fiber foods.  Limit salt (sodium) if told by your doctor. Ask your diet specialist to tell you which seasonings are healthy for your heart.  Cook in healthy ways instead of frying. Healthy ways of cooking include roasting, grilling, broiling, baking, poaching, steaming, and stir-frying.  Limit how much fluid you drink, if told by your doctor. Alcohol use  Do not drink alcohol if: ? Your doctor tells you not to drink. ? Your heart was damaged by alcohol, or you have very bad heart failure. ? You are pregnant, may be pregnant, or are planning to become pregnant.  If you drink alcohol: ? Limit how much you use to:  0-1 drink a day for women.  0-2 drinks a day for men. ? Be aware of how much alcohol is in your drink. In the [place] one drink equals one 12 oz bottle of beer (355 mL), one 5 oz glass of wine (148 mL), or one 1 oz glass of hard liquor (44 mL). Lifestyle   Do not use any products that contain nicotine or tobacco, such as cigarettes, e-cigarettes, and chewing tobacco. If you need help quitting, ask your doctor. ? Do not use nicotine gum or patches before talking to your doctor.  Do not use illegal drugs.  Lose weight if told by your doctor.  Do physical activity if  told by your doctor. Talk to your doctor before you begin an exercise if: ? You are an older adult. ? You have very bad heart failure.  Learn to manage stress. If you need help, ask your doctor.  Get rehab (rehabilitation) to help you stay independent and to help with your quality of life.  Plan time to rest when you get tired. Check weight and blood pressure   Weigh yourself every day. This will help you to know if fluid is building up in your body. ? Weigh yourself every morning after you pee (urinate) and before you eat breakfast. ? Wear the same amount of clothing each time. ? Write down your daily weight. Give your record to your doctor.  Check and write down your blood pressure as told by your doctor.  Check your pulse as told by your doctor. Dealing with very hot and  very cold weather  If it is very hot: ? Avoid activities that take a lot of energy. ? Use air conditioning or fans, or find a cooler place. ? Avoid caffeine and alcohol. ? Wear clothing that is loose-fitting, lightweight, and light-colored.  If it is very cold: ? Avoid activities that take a lot of energy. ? Layer your clothes. ? Wear mittens or gloves, a hat, and a scarf when you go outside. ? Avoid alcohol. Follow these instructions at home:  Stay up to date with shots (vaccines). Get pneumococcal and flu (influenza) shots.  Keep all follow-up visits as told by your doctor. This is important. Contact a doctor if:  You gain weight quickly.  You have increasing shortness of breath.  You cannot do your normal activities.  You get tired easily.  You cough a lot.  You don't feel like eating or feel like you may vomit (nauseous).  You become puffy (swell) in your hands, feet, ankles, or belly (abdomen).  You cannot sleep well because it is hard to breathe.  You feel like your heart is beating fast (palpitations).  You get dizzy when you stand up. Get help right away if:  You have trouble  breathing.  You or someone else notices a change in your behavior, such as having trouble staying awake.  You have chest pain or discomfort.  You pass out (faint). These symptoms may be an emergency. Do not wait to see if the symptoms will go away. Get medical help right away. Call your local emergency services (911 in the [place] Do not drive yourself to the hospital. Summary  Heart failure is a serious condition. To care for yourself, you may have to change your diet, take medicines, and make other lifestyle changes.  Take your medicines every day. Do not stop taking them unless your doctor tells you to do so.  Eat heart-healthy foods, such as fresh or frozen fruits and vegetables, fish, lean meats, legumes, fat-free or low-fat dairy products, and whole-grain or high-fiber foods.  Ask your doctor if you can drink alcohol. You may have to stop alcohol use if you have very bad heart failure.  Contact your doctor if you gain weight quickly or feel that your heart is beating too fast. Get help right away if you pass out, or have chest pain or trouble breathing. This information is not intended to replace advice given to you by your health care provider. Make sure you discuss any questions you have with your health care provider. Document Revised: 03/14/2019 Document Reviewed: 03/15/2019 Elsevier Patient Education  [place].   Heart Failure Exacerbation  Heart failure is a condition in which the heart does not fill up with enough blood, and therefore does not pump enough blood and oxygen to the body. When this happens, parts of the body do not get the blood and oxygen they need to function properly. This can cause symptoms such as breathing problems, fatigue, swelling, and confusion. Heart failure exacerbation refers to heart failure symptoms that get worse. The symptoms may get worse suddenly or develop slowly over time. Heart failure exacerbation is a serious medical problem that  should be treated right away. What are the causes? A heart failure exacerbation can be triggered by:  Not taking your heart failure medicines correctly.  Infections.  Eating an unhealthy diet or a diet that is high in salt (sodium).  Drinking too much fluid.  Drinking alcohol.  Taking illegal drugs, such as cocaine or  methamphetamine.  Not exercising. Other causes include:  Other heart conditions such as an irregular heartbeat (arrhythmia).  Anemia.  Other medical problems, such as kidney failure. Sometimes the cause of the exacerbation is not known. What are the signs or symptoms? When heart failure symptoms suddenly or slowly get worse, this may be a sign of heart failure exacerbation. Symptoms of heart failure include:  Breathing problems or shortness of breath.  Chronic coughing or wheezing.  Fatigue.  Nausea or lack of appetite.  Feeling light-headed.  Confusion or memory loss.  Increased heart rate or irregular heartbeat.  Buildup of fluid in the legs, ankles, feet, or abdomen.  Difficulty breathing when lying down. How is this diagnosed? This condition is diagnosed based on:  Your symptoms and medical history.  A physical exam. You may also have tests, including:  Electrocardiogram (ECG). This test measures the electrical activity of your heart.  Echocardiogram. This test uses sound waves to take a picture of your heart to see how well it works.  Blood tests.  Imaging tests, such as: ? Chest X-ray. ? MRI. ? Ultrasound.  Stress test. This test examines how well your heart functions when you exercise. Your heart is monitored while you exercise on a treadmill or exercise bike. If you cannot exercise, medicines may be used to increase your heartbeat in place of exercise.  Cardiac catheterization. During this test, a thin, flexible tube (catheter) is inserted into a blood vessel and threaded up to your heart. This test allows your health care  provider to check the arteries that lead to your heart (coronary arteries).  Right heart catheterization. During this test, the pressure in your heart is measured. How is this treated? This condition may be treated by:  Adjusting your heart medicines.  Maintaining a healthy lifestyle. This includes: ? Eating a heart-healthy diet that is low in sodium. ? Not using any products that contain nicotine or tobacco, such as cigarettes and e-cigarettes. ? Regular exercise. ? Monitoring your fluid intake. ? Monitoring your weight and reporting changes to your health care provider.  Treating sleep apnea, if you have this condition.  Surgery. This may include: ? Implanting a device that helps both sides of your heart contract at the same time (cardiac resynchronization therapy device). This can help with heart function and relieve heart failure symptoms. ? Implanting a device that can correct heart rhythm problems (implantable cardioverter defibrillator). ? Connecting a device to your heart to help it pump blood (ventricular assist device). ? Heart transplant. Follow these instructions at home: Medicines  Take over-the-counter and prescription medicines only as told by your health care provider.  Do not stop taking your medicines or change the amount you take. If you are having problems or side effects from your medicines, talk to your health care provider.  If you are having difficulty paying for your medicines, contact a social worker or your clinic. There are many programs to assist with medicine costs.  Talk to your health care provider before starting any new medicines or supplements.  Make sure your health care provider and pharmacist have a list of all the medicines you are taking. Eating and drinking   Avoid drinking alcohol.  Eat a heart-healthy diet as told by your health care provider. This includes: ? Plenty of fruits and vegetables. ? Lean proteins. ? Low-fat  dairy. ? Whole grains. ? Foods that are low in sodium. Activity   Exercise regularly as told by your health care provider. Balance exercise  with rest.  Ask your health care provider what activities are safe for you. This includes sexual activity, exercise, and daily tasks at home or work. Lifestyle  Do not use any products that contain nicotine or tobacco, such as cigarettes and e-cigarettes. If you need help quitting, ask your health care provider.  Maintain a healthy weight. Ask your health care provider what weight is healthy for you.  Consider joining a patient support group. This can help with emotional problems you may have, such as stress and anxiety. General instructions  Talk to your health care provider about flu and pneumonia vaccines.  Keep a list of medicines that you are taking. This may help in emergency situations.  Keep all follow-up visits as told by your health care provider. This is important. Contact a health care provider if:  You have questions about your medicines or you miss a dose.  You feel anxious, depressed, or stressed.  You have swelling in your feet, ankles, legs, or abdomen.  You have shortness of breath during activity or exercise.  You have a cough.  You have a fever.  You have trouble sleeping.  You gain 2-3 lb (1-1.4 kg) in 24 hours or 5 lb (2.3 kg) in a week. Get help right away if:  You have chest pain.  You have shortness of breath while resting.  You have severe fatigue.  You are confused.  You have severe dizziness.  You have a rapid or irregular heartbeat.  You have nausea or you vomit.  You have a cough that is worse at night or you cannot lie flat.  You have a cough that will not go away.  You have severe depression or sadness. Summary  When heart failure symptoms get worse, it is called heart failure exacerbation.  Common causes of this condition include taking medicines incorrectly, infections, and  drinking alcohol.  This condition may be treated by adjusting medicines, maintaining a healthy lifestyle, or surgery.  Do not stop taking your medicines or change the amount you take. If you are having problems or side effects from your medicines, talk to your health care provider. This information is not intended to replace advice given to you by your health care provider. Make sure you discuss any questions you have with your health care provider. Document Revised: 11/12/2017 Document Reviewed: 04/13/2017 Elsevier Patient Education  2020 [place].   Heart Failure Eating Plan Heart failure, also called congestive heart failure, occurs when your heart does not pump blood well enough to meet your body's needs for oxygen-rich blood. Heart failure is a long-term (chronic) condition. Living with heart failure can be challenging. However, following your health care provider's instructions about a healthy lifestyle and working with a diet and nutrition specialist (dietitian) to choose the right foods may help to improve your symptoms. What are tips for following this plan? Reading food labels  Check food labels for the amount of sodium per serving. Choose foods that have less than 140 mg (milligrams) of sodium in each serving.  Check food labels for the number of calories per serving. This is important if you need to limit your daily calorie intake to lose weight.  Check food labels for the serving size. If you eat more than one serving, you will be eating more sodium and calories than what is listed on the label.  Look for foods that are labeled as "sodium-free," "very low sodium," or "low sodium." ? Foods labeled as "reduced sodium" or "lightly salted"  may still have more sodium than what is recommended for you. Cooking  Avoid adding salt when cooking. Ask your health care provider or dietitian before using salt substitutes.  Season food with salt-free seasonings, spices, or herbs. Check  the label of seasoning mixes to make sure they do not contain salt.  Cook with heart-healthy oils, such as olive, canola, soybean, or sunflower oil.  Do not fry foods. Cook foods using low-fat methods, such as baking, boiling, grilling, and broiling.  Limit unhealthy fats when cooking by: ? Removing the skin from poultry, such as chicken. ? Removing all visible fats from meats. ? Skimming the fat off from stews, soups, and gravies before serving them. Meal planning   Limit your intake of: ? Processed, canned, or pre-packaged foods. ? Foods that are high in trans fat, such as fried foods. ? Sweets, desserts, sugary drinks, and other foods with added sugar. ? Full-fat dairy products, such as whole milk.  Eat a balanced diet that includes: ? 4-5 servings of fruit each day and 4-5 servings of vegetables each day. At each meal, try to fill half of your plate with fruits and vegetables. ? Up to 6-8 servings of whole grains each day. ? Up to 2 servings of lean meat, poultry, or fish each day. One serving of meat is equal to 3 oz. This is about the same size as a deck of cards. ? 2 servings of low-fat dairy each day. ? Heart-healthy fats. Healthy fats called omega-3 fatty acids are found in foods such as flaxseed and cold-water fish like sardines, salmon, and mackerel.  Aim to eat 25-35 g (grams) of fiber a day. Foods that are high in fiber include apples, broccoli, carrots, beans, peas, and whole grains.  Do not add salt or condiments that contain salt (such as soy sauce) to foods before eating.  When eating at a restaurant, ask that your food be prepared with less salt or no salt, if possible.  Try to eat 2 or more vegetarian meals each week.  Eat more home-cooked food and eat less restaurant, buffet, and fast food. General information  Do not eat more than 2,300 mg of salt (sodium) a day. The amount of sodium that is recommended for you may be lower, depending on your  condition.  Maintain a healthy body weight as directed. Ask your health care provider what a healthy weight is for you. ? Check your weight every day. ? Work with your health care provider and dietitian to make a plan that is right for you to lose weight or maintain your current weight.  Limit how much fluid you drink. Ask your health care provider or dietitian how much fluid you can have each day.  Limit or avoid alcohol as told by your health care provider or dietitian. Recommended foods The items listed may not be a complete list. Talk with your dietitian about what dietary choices are best for you. Fruits All fresh, frozen, and canned fruits. Dried fruits, such as raisins, prunes, and cranberries. Vegetables All fresh vegetables. Vegetables that are frozen without sauce or added salt. Low-sodium or sodium-free canned vegetables. Grains Bread with less than 80 mg of sodium per slice. Whole-wheat pasta, quinoa, and brown rice. Oats and oatmeal. Barley. [place]. Grits and cream of wheat. Whole-grain and whole-wheat cold cereal. Meats and other protein foods Lean cuts of meat. Skinless chicken and [place]. Fish with high omega-3 fatty acids, such as salmon, sardines, and other cold-water fishes. Eggs. Dried beans,  peas, and edamame. Unsalted nuts and nut butters. Dairy Low-fat or nonfat (skim) milk and dried milk. Rice milk, soy milk, and almond milk. Low-fat or nonfat yogurt. Small amounts of reduced-sodium block cheese. Low-sodium cottage cheese. Fats and oils Olive, canola, soybean, flaxseed, or sunflower oil. Avocado. Sweets and desserts Apple sauce. Granola bars. Sugar-free pudding and gelatin. Frozen fruit bars. Seasoning and other foods Fresh and dried herbs. Lemon or lime juice. Vinegar. Low-sodium ketchup. Salt-free marinades, salad dressings, sauces, and seasonings. The items listed above may not be a complete list of foods and beverages you can eat. Contact a dietitian for more  information. Foods to avoid The items listed may not be a complete list. Talk with your dietitian about what dietary choices are best for you. Fruits Fruits that are dried with sodium-containing preservatives. Vegetables Canned vegetables. Frozen vegetables with sauce or seasonings. Creamed vegetables. [place] fries. Onion rings. Pickled vegetables and sauerkraut. Grains Bread with more than 80 mg of sodium per slice. Hot or cold cereal with more than 140 mg sodium per serving. Salted pretzels and crackers. Pre-packaged breadcrumbs. Bagels, croissants, and biscuits. Meats and other protein foods Ribs and chicken wings. Bacon, ham, pepperoni, bologna, salami, and packaged luncheon meats. Hot dogs, bratwurst, and sausage. Canned meat. Smoked meat and fish. Salted nuts and seeds. Dairy Whole milk, half-and-half, and cream. Buttermilk. Processed cheese, cheese spreads, and cheese curds. Regular cottage cheese. Feta cheese. Shredded cheese. String cheese. Fats and oils Butter, lard, shortening, ghee, and bacon fat. Canned and packaged gravies. Seasoning and other foods Onion salt, garlic salt, table salt, and sea salt. Marinades. Regular salad dressings. Relishes, pickles, and olives. Meat flavorings and tenderizers, and bouillon cubes. Horseradish, ketchup, and mustard. Worcestershire sauce. Teriyaki sauce, soy sauce (including reduced sodium). Hot sauce and Tabasco sauce. Steak sauce, fish sauce, oyster sauce, and cocktail sauce. Taco seasonings. Barbecue sauce. Tartar sauce. The items listed above may not be a complete list of foods and beverages you should avoid. Contact a dietitian for more information. Summary  A heart failure eating plan includes changes that limit your intake of sodium and unhealthy fat, and it may help you lose weight or maintain a healthy weight. Your health care provider may also recommend limiting how much fluid you drink.  Most people with heart failure should eat no  more than 2,300 mg of salt (sodium) a day. The amount of sodium that is recommended for you may be lower, depending on your condition.  Contact your health care provider or dietitian before making any major changes to your diet. This information is not intended to replace advice given to you by your health care provider. Make sure you discuss any questions you have with your health care provider. Document Revised: 01/26/2019 Document Reviewed: 04/16/2017 Elsevier Patient Education  [place].   IMPORTANT INFORMATION: PAY CLOSE ATTENTION   PHYSICIAN DISCHARGE INSTRUCTIONS  Follow with Primary care provider  [name], MD  and other consultants as instructed by your Hospitalist Physician  [place] IF SYMPTOMS COME BACK, WORSEN OR NEW PROBLEM DEVELOPS   Please note: You were cared for by a hospitalist during your hospital stay. Every effort will be made to forward records to your primary care provider.  You can request that your primary care provider send for your hospital records if they have not received them.  Once you are discharged, your primary care physician will handle any further medical issues. Please note that NO  REFILLS for any discharge medications will be authorized once you are discharged, as it is imperative that you return to your primary care physician (or establish a relationship with a primary care physician if you do not have one) for your post hospital discharge needs so that they can reassess your need for medications and monitor your lab values.  Please get a complete blood count and chemistry panel checked by your Primary MD at your next visit, and again as instructed by your Primary MD.  Get Medicines reviewed and adjusted: Please take all your medications with you for your next visit with your Primary MD  Laboratory/radiological data: Please request your Primary MD to go over all hospital tests and  procedure/radiological results at the follow up, please ask your primary care provider to get all Hospital records sent to his/her office.  In some cases, they will be blood work, cultures and biopsy results pending at the time of your discharge. Please request that your primary care provider follow up on these results.  If you are diabetic, please bring your blood sugar readings with you to your follow up appointment with primary care.    Please call and make your follow up appointments as soon as possible.    Also Note the following: If you experience worsening of your admission symptoms, develop shortness of breath, life threatening emergency, suicidal or homicidal thoughts you must seek medical attention immediately by calling 911 or calling your MD immediately  if symptoms less severe.  You must read complete instructions/literature along with all the possible adverse reactions/side effects for all the Medicines you take and that have been prescribed to you. Take any new Medicines after you have completely understood and accpet all the possible adverse reactions/side effects.   Do not drive when taking Pain medications or sleeping medications (Benzodiazepines)  Do not take more than prescribed Pain, Sleep and Anxiety Medications. It is not advisable to combine anxiety,sleep and pain medications without talking with your primary care practitioner  Special Instructions: If you have smoked or chewed Tobacco  in the last 2 yrs please stop smoking, stop any regular Alcohol  and or any Recreational drug use.  Wear Seat belts while driving.  Do not drive if taking any narcotic, mind altering or controlled substances or recreational drugs or alcohol.

## 2020-05-10 NOTE — Progress Notes (Signed)
Discharge instructions and medications reviewed with patient. Patient given opportunity to ask questions/voice concerns.

## 2020-05-10 NOTE — Progress Notes (Signed)
Progress Note  Patient Name: Antonio Cobb Date of Encounter: 05/10/2020  Primary Cardiologist: [name], MD  Subjective   States that his breathing status is at baseline, not short of breath, no chest pain or palpitations.  Inpatient Medications    Scheduled Meds: . aspirin EC  81 mg Oral Q breakfast  . busPIRone  10 mg Oral TID  . docusate sodium  100 mg Oral Daily  . enoxaparin (LOVENOX) injection  40 mg Subcutaneous Q24H  . furosemide  60 mg Intravenous Q12H  . insulin aspart  0-5 Units Subcutaneous QHS  . insulin aspart  0-9 Units Subcutaneous TID WC  . insulin aspart  14 Units Subcutaneous TID WC  . insulin glargine  50 Units Subcutaneous QPM  . loratadine  10 mg Oral Daily  . losartan  25 mg Oral Daily  . metoprolol succinate  50 mg Oral Daily  . multivitamin with minerals  1 tablet Oral Daily  . potassium chloride SA  20 mEq Oral BID  . rosuvastatin  40 mg Oral QPM  . sodium chloride flush  3 mL Intravenous Q12H  . spironolactone  12.5 mg Oral Daily   Continuous Infusions: . sodium chloride     PRN Meds: sodium chloride, acetaminophen, hydrOXYzine, melatonin, sodium chloride flush   Vital Signs    Vitals:   05/09/20 2042 05/09/20 2303 05/10/20 0300 05/10/20 0501  BP: (!) 129/91   133/90  Pulse: 81   73  Resp: 16   16  Temp: 98.7 F (37.1 C)   98 F (36.7 C)  TempSrc: Oral   Oral  SpO2: 97% 98%  100%  Weight:   95.1 kg   Height:        Intake/Output Summary (Last 24 hours) at 05/10/2020 0907 Last data filed at 05/09/2020 0915 Gross per 24 hour  Intake --  Output 1200 ml  Net -1200 ml   Filed Weights   05/08/20 0641 05/09/20 0316 05/10/20 0300  Weight: 94 kg 93.3 kg 95.1 kg    Telemetry    Sinus rhythm with occasional PVCs.  Personally reviewed.  Physical Exam   GEN: No acute distress.   Neck: No JVD. Cardiac: RRR, no gallop.  Respiratory: Nonlabored.  No crackles. GI: Soft, nontender, bowel sounds present. MS: No edema;  No deformity.  Labs    Chemistry Recent Labs  Lab 05/08/20 [phone] 05/09/20 0713 05/10/20 0717  NA 135 139 138  K 3.3* 3.4* 3.6  CL 96* 97* 100  CO2 24 29 24   GLUCOSE 431* 213* 162*  BUN 28* 27* 24*  CREATININE 1.58* 1.30* 1.19  CALCIUM 8.8* 9.3 9.2  PROT 7.8 7.4 7.5  ALBUMIN 3.8 3.6 3.7  AST 25 21 24   ALT 28 24 25   ALKPHOS 101 86 86  BILITOT 1.1 1.4* 1.0  GFRNONAA 47* 60* >60  GFRAA 55* >60 >60  ANIONGAP 15 13 14      Hematology Recent Labs  Lab 05/08/20 0643  WBC 9.2  RBC 5.66  HGB 15.5  HCT 49.0  MCV 86.6  MCH 27.4  MCHC 31.6  RDW 13.0  PLT 258    Cardiac Enzymes Recent Labs  Lab 05/08/20 0643 05/08/20 0837  TROPONINIHS 25* 41*    BNP Recent Labs  Lab 05/08/20 0643 05/09/20 0713 05/10/20 0717  BNP 531.0* 649.0* 514.0*     Radiology    DG Chest Port 1 View  Result Date: 05/09/2020 CLINICAL DATA:  Acute respiratory failure and acute systolic  CHF EXAM: PORTABLE CHEST 1 VIEW COMPARISON:  Radiograph 05/08/2020 FINDINGS: Pacer pack overlies the left chest wall with lead in stable position directed towards the cardiac apex. Increasing interstitial opacities with central vascular congestion. No focal consolidation, pneumothorax or visible effusion. Cardiomediastinal contours are unchanged from prior. No acute osseous or soft tissue abnormality. IMPRESSION: Increasing interstitial opacities with central vascular congestion could reflect developing edema or less likely infection. Electronically Signed   By: Lovena Le M.D.   On: 05/09/2020 04:56   ECHOCARDIOGRAM COMPLETE  Result Date: 05/09/2020    ECHOCARDIOGRAM REPORT   Patient Name:   Antonio Cobb Date of Exam: 05/09/2020 Medical Rec #:  [id number]          Height:       70.0 in Accession #:    [id number]         Weight:       205.7 lb Date of Birth:  06-28-1961          BSA:          2.112 m Patient Age:    59 years           BP:           157/99 mmHg Patient Gender: M                  HR:            88 bpm. Exam Location:  [facility] Procedure: 2D Echo, Cardiac Doppler and Color Doppler Indications:    CHF  History:        Patient has prior history of Echocardiogram examinations, most                 recent 11/13/2019. CHF, CAD and Previous Myocardial Infarction,                 Defibrillator, COPD, Mitral Valve Disease; Risk                 Factors:Hypertension, Dyslipidemia and Diabetes. Alcohol abuse.  Sonographer:    Dustin Flock RDCS Referring Phys: [place]  1. Left ventricular ejection fraction, by estimation, is 20%. The left ventricle has severely decreased function. The left ventricle demonstrates regional wall motion abnormalities (see scoring diagram/findings for description). The left ventricular internal cavity size was mildly dilated. There is mild left ventricular hypertrophy. Left ventricular diastolic parameters are consistent with Grade II diastolic dysfunction (pseudonormalization).  2. Right ventricular systolic function is normal. The right ventricular size is normal. There is normal pulmonary artery systolic pressure. The estimated right ventricular systolic pressure is Q000111Q mmHg.  3. Left atrial size was upper normal.  4. The mitral valve is grossly normal. Mild mitral valve regurgitation.  5. The aortic valve is tricuspid. Aortic valve regurgitation is trivial.  6. The inferior vena cava is dilated in size with >50% respiratory variability, suggesting right atrial pressure of 8 mmHg. FINDINGS  Left Ventricle: Left ventricular ejection fraction, by estimation, is 20%. The left ventricle has severely decreased function. The left ventricle demonstrates regional wall motion abnormalities. The left ventricular internal cavity size was mildly dilated. There is mild left ventricular hypertrophy. Left ventricular diastolic parameters are consistent with Grade II diastolic dysfunction (pseudonormalization).  LV Wall Scoring: The antero-lateral wall, inferior wall,  and posterior wall are akinetic. Right Ventricle: The right ventricular size is normal. No increase in right ventricular wall thickness. Right ventricular systolic function is normal. There is normal pulmonary artery  systolic pressure. The tricuspid regurgitant velocity is 2.51 m/s, and  with an assumed right atrial pressure of 8 mmHg, the estimated right ventricular systolic pressure is Q000111Q mmHg. Left Atrium: Left atrial size was upper normal. Right Atrium: Right atrial size was normal in size. Pericardium: There is no evidence of pericardial effusion. Mitral Valve: The mitral valve is grossly normal. Mild mitral annular calcification. Mild mitral valve regurgitation. Tricuspid Valve: The tricuspid valve is grossly normal. Tricuspid valve regurgitation is trivial. Aortic Valve: The aortic valve is tricuspid. Aortic valve regurgitation is trivial. Mild aortic valve annular calcification. Pulmonic Valve: The pulmonic valve was grossly normal. Pulmonic valve regurgitation is trivial. Aorta: The aortic root is normal in size and structure. Venous: The inferior vena cava is dilated in size with greater than 50% respiratory variability, suggesting right atrial pressure of 8 mmHg. IAS/Shunts: No atrial level shunt detected by color flow Doppler. Additional Comments: A pacer wire is visualized.  LEFT VENTRICLE PLAX 2D LVIDd:         6.28 cm  Diastology LVIDs:         5.73 cm  LV e' lateral:   5.87 cm/s LV PW:         1.35 cm  LV E/e' lateral: 22.0 LV IVS:        1.46 cm  LV e' medial:    6.64 cm/s LVOT diam:     2.60 cm  LV E/e' medial:  19.4 LV SV:         90 LV SV Index:   42 LVOT Area:     5.31 cm  RIGHT VENTRICLE RV Basal diam:  3.24 cm RV S prime:     7.29 cm/s TAPSE (M-mode): 2.3 cm LEFT ATRIUM             Index       RIGHT ATRIUM           Index LA diam:        3.85 cm 1.82 cm/m  RA Area:     10.20 cm LA Vol (A2C):   43.8 ml 20.74 ml/m RA Volume:   18.00 ml  8.52 ml/m LA Vol (A4C):   65.8 ml 31.15 ml/m LA  Biplane Vol: 57.1 ml 27.03 ml/m  AORTIC VALVE LVOT Vmax:   104.00 cm/s LVOT Vmean:  60.300 cm/s LVOT VTI:    0.169 m  AORTA Ao Root diam: 3.50 cm MITRAL VALVE                TRICUSPID VALVE MV Area (PHT): 5.23 cm     TR Peak grad:   25.2 mmHg MV Decel Time: 145 msec     TR Vmax:        251.00 cm/s MV E velocity: 129.00 cm/s MV A velocity: 56.30 cm/s   SHUNTS MV E/A ratio:  2.29         Systemic VTI:  0.17 m                             Systemic Diam: 2.60 cm Rozann Lesches MD Electronically signed by Rozann Lesches MD Signature Date/Time: 05/09/2020/3:57:34 PM    Final    Patient Profile     59 y.o. male with past medical history of CAD(s/p STEMI in 2013 with DES to LCx, patent by repeat cath in 05/2016 with moderate residual CAD along RCA and LAD), chronic combined systolic and diastolic CHF, s/p ICD placement in  01/2018), HTN, and HLDpresenting with acute on chronic combined heart failure.  Assessment & Plan    1.  Acute on chronic combined heart failure, clinically improved with IV diuresis.  Net output of 4000 cc last 48 hours.  Renal function has actually improved during diuresis.  Follow-up echocardiogram only 20% with grade 2 diastolic dysfunction, normal RV contraction, and upper normal RVSP 33 mmHg.  2.  CAD with previous history of STEMI in 2013 managed with DES to the circumflex, patent and angiography in 2017 with moderate residual LAD and RCA disease that has been managed medically.  No clear evidence of ACS with high-sensitivity troponin I of 25 and 41.  No recent chest discomfort reported.  3.  Essential hypertension.  4.  CKD stage IIIb, most recent creatinine is down to 1.19 with normal potassium.  5.  OSA, has tolerated CPAP during hospital stay and anticipates delivery of equipment when he goes home.  [facility] will sign off.   Medication Recommendations: Continue current doses of aspirin, Toprol-XL, Cozaar, Crestor, Aldactone, and KCl.  Increase outpatient Demadex to  60 mg daily. Other recommendations (labs, testing, etc): BMET for office follow-up. Follow up as an outpatient: 2 weeks in the [place] office with either Dr. Harl Bowie or [place].  Signed, Rozann Lesches, MD  05/10/2020, 9:07 AM

## 2020-05-13 LAB — CUP PACEART REMOTE DEVICE CHECK
Battery Remaining Longevity: 174 mo
Battery Remaining Percentage: 100 %
Brady Statistic RV Percent Paced: 0 %
Date Time Interrogation Session: 20210531002400
HighPow Impedance: 71 Ohm
Implantable Lead Implant Date: 20190219
Implantable Lead Location: 753860
Implantable Lead Model: 293
Implantable Lead Serial Number: 438538
Implantable Pulse Generator Implant Date: 20190219
Lead Channel Impedance Value: 699 Ohm
Lead Channel Setting Pacing Amplitude: 2.5 V
Lead Channel Setting Pacing Pulse Width: 0.4 ms
Lead Channel Setting Sensing Sensitivity: 0.5 mV
Pulse Gen Serial Number: 248173

## 2020-05-15 ENCOUNTER — Ambulatory Visit (INDEPENDENT_AMBULATORY_CARE_PROVIDER_SITE_OTHER): Payer: BC Managed Care – PPO | Admitting: *Deleted

## 2020-05-15 DIAGNOSIS — I255 Ischemic cardiomyopathy: Secondary | ICD-10-CM | POA: Diagnosis not present

## 2020-05-15 DIAGNOSIS — Z95810 Presence of automatic (implantable) cardiac defibrillator: Secondary | ICD-10-CM | POA: Diagnosis not present

## 2020-05-16 NOTE — Progress Notes (Signed)
Remote ICD transmission.   

## 2020-05-24 ENCOUNTER — Ambulatory Visit: Payer: BC Managed Care – PPO | Admitting: Student

## 2020-05-27 ENCOUNTER — Other Ambulatory Visit: Payer: Self-pay

## 2020-05-27 ENCOUNTER — Encounter: Payer: Self-pay | Admitting: Family Medicine

## 2020-05-27 ENCOUNTER — Other Ambulatory Visit: Payer: Self-pay | Admitting: *Deleted

## 2020-05-27 ENCOUNTER — Ambulatory Visit (INDEPENDENT_AMBULATORY_CARE_PROVIDER_SITE_OTHER): Payer: BC Managed Care – PPO | Admitting: Family Medicine

## 2020-05-27 VITALS — BP 144/91 | HR 82 | Temp 98.3°F | Resp 16 | Ht 70.0 in | Wt 213.8 lb

## 2020-05-27 DIAGNOSIS — I10 Essential (primary) hypertension: Secondary | ICD-10-CM | POA: Diagnosis not present

## 2020-05-27 DIAGNOSIS — Z09 Encounter for follow-up examination after completed treatment for conditions other than malignant neoplasm: Secondary | ICD-10-CM | POA: Diagnosis not present

## 2020-05-27 DIAGNOSIS — E1159 Type 2 diabetes mellitus with other circulatory complications: Secondary | ICD-10-CM

## 2020-05-27 DIAGNOSIS — F1010 Alcohol abuse, uncomplicated: Secondary | ICD-10-CM | POA: Diagnosis not present

## 2020-05-27 DIAGNOSIS — G4733 Obstructive sleep apnea (adult) (pediatric): Secondary | ICD-10-CM

## 2020-05-27 DIAGNOSIS — E1065 Type 1 diabetes mellitus with hyperglycemia: Secondary | ICD-10-CM

## 2020-05-27 MED ORDER — INSULIN REGULAR HUMAN 100 UNIT/ML IJ SOLN: 10.0000 [IU] | Freq: Three times a day (TID) | INTRAMUSCULAR | 0 refills | Status: DC

## 2020-05-27 MED ORDER — LANTUS SOLOSTAR 100 UNIT/ML ~~LOC~~ SOPN: PEN_INJECTOR | SUBCUTANEOUS | 0 refills | Status: DC

## 2020-05-27 NOTE — Assessment & Plan Note (Signed)
counselled to quit, not commiting to this and reports cutting back

## 2020-05-27 NOTE — Progress Notes (Signed)
Antonio Cobb     MRN: [id number]      DOB: 1961/10/10   HPI Antonio Cobb is here for follow up of recent hospitalization form acute hypoxia due to respiratory failure   as had not been on CPAP for months, despite the fact that it was prescribed. Now states feels much better with the [facility] records are reviewed from are reviewed from his recent hospitalization and lab and radiologic data also discussed with the patient.  All questions were answered. Not weighing daily, denies leg swelling, pNd or orthopnea  Not testing blood sugar, has not been to Endo x 2 years, despite the fact that he is repeatedly advised to do so because  Of uncontrolled blood sugar.   .  Denies polyuria, polydipsia, blurred vision , or hypoglycemic episodes. Drinks beer on average twice weekly and has not yet committed to completely stopping    ROS Denies recent fever or chills. Denies sinus pressure, nasal congestion, ear pain or sore throat. Denies chest congestion, productive cough or wheezing. Denies chest pains, palpitations and leg swelling Denies abdominal pain, nausea, vomiting,diarrhea or constipation.   Denies dysuria, frequency, hesitancy or incontinence. Denies joint pain, swelling and limitation in mobility. Denies headaches, seizures, numbness, or tingling. Denies depression, anxiety or insomnia. Denies skin break down or rash.   PE  BP (!) 144/91 (BP Location: Left Arm, Patient Position: Sitting, Cuff Size: Normal)   Pulse 82   Temp 98.3 F (36.8 C) (Oral)   Resp 16   Ht 5\' 10"  (1.778 m)   Wt 213 lb 12.8 oz (97 kg)   SpO2 97%   BMI 30.68 kg/m   Patient alert and oriented and in no cardiopulmonary distress.  HEENT: No facial asymmetry, EOMI,     Neck supple .  Chest: Clear to auscultation bilaterally.  CVS: S1, S2 no murmurs, no S3.Regular rate.  ABD: Soft non tender.   Ext: No edema  MS: Adequate ROM spine, shoulders, hips and knees.  Skin: Intact, no  ulcerations or rash noted.  Psych: Good eye contact, normal affect. Memory intact not anxious or depressed appearing.  CNS: CN 2-12 intact, power,  normal throughout.no focal deficits noted.   Assessment & Plan  Essential hypertension Elevated at visit, will ned medication adjustment  Most likely, has upcoming Cardiology appt and will have Cardiology re evaluate a nd adjust as needed  Alcohol abuse counselled to quit, not commiting to this and reports cutting back  Uncontrolled type 1 diabetes mellitus with hyperglycemia ([place]) Deteriorated, needs Endo management Pt and wife state he will go, will refer again [name] is reminded of the importance of commitment to daily physical activity for 30 minutes or more, as able and the need to limit carbohydrate intake to 30 to 60 grams per meal to help with blood sugar control.   The need to take medication as prescribed, test blood sugar as directed, and to call between visits if there is a concern that blood sugar is uncontrolled is also discussed.   [name] is reminded of the importance of daily foot exam, annual eye examination, and good blood sugar, blood pressure and cholesterol control.  Diabetic Labs Latest Ref Rng & Units 05/10/2020 05/09/2020 05/08/2020 01/25/2020 01/24/2020  HbA1c 4.8 - 5.6 % - - 10.2(H) - -  Microalbumin mg/dL - - - - -  Micro/Creat Ratio 0.0 - 30.0 mg/g creat - - - - -  Chol 0 - 200 mg/dL - 249(H) - - -  HDL >40 mg/dL - 47 - - -  Calc LDL 0 - 99 mg/dL - 149(H) - - -  Triglycerides <150 mg/dL - 267(H) - - -  Creatinine 0.61 - 1.24 mg/dL 1.19 1.30(H) 1.58(H) 1.30(H) 1.29(H)   BP/Weight 05/27/2020 05/10/2020 03/27/2020 02/28/2020 02/27/2020 02/21/2020 7/90/3833  Systolic BP 383 291 916 606 004 599 774  Diastolic BP 91 90 80 80 86 62 68  Wt. (Lbs) 213.8 209.66 206 206.8 209 201.8 208  BMI 30.68 30.08 29.56 29.67 29.99 28.96 29.84   Foot/eye exam completion dates Latest Ref Rng & Units 02/21/2018 10/28/2016  Eye  Exam No Retinopathy - -  Foot Form Completion - Done Done        OSA (obstructive sleep apnea)  Reports co,mpliance since recent hospitalization and states that he feels 100 % better  Hospital discharge follow-up Patient in for follow up of recent hospitalization. Discharge summary, and laboratory and radiology data are reviewed, and any questions or concerns about recent hospitalization are discussed. Specific issues requiring follow up are specifically addressed.

## 2020-05-27 NOTE — Assessment & Plan Note (Signed)
Deteriorated, needs Endo management Pt and wife state he will go, will refer again Antonio Cobb is reminded of the importance of commitment to daily physical activity for 30 minutes or more, as able and the need to limit carbohydrate intake to 30 to 60 grams per meal to help with blood sugar control.   The need to take medication as prescribed, test blood sugar as directed, and to call between visits if there is a concern that blood sugar is uncontrolled is also discussed.   Antonio Cobb is reminded of the importance of daily foot exam, annual eye examination, and good blood sugar, blood pressure and cholesterol control.  Diabetic Labs Latest Ref Rng & Units 05/10/2020 05/09/2020 05/08/2020 01/25/2020 01/24/2020  HbA1c 4.8 - 5.6 % - - 10.2(H) - -  Microalbumin mg/dL - - - - -  Micro/Creat Ratio 0.0 - 30.0 mg/g creat - - - - -  Chol 0 - 200 mg/dL - 249(H) - - -  HDL >40 mg/dL - 47 - - -  Calc LDL 0 - 99 mg/dL - 149(H) - - -  Triglycerides <150 mg/dL - 267(H) - - -  Creatinine 0.61 - 1.24 mg/dL 1.19 1.30(H) 1.58(H) 1.30(H) 1.29(H)   BP/Weight 05/27/2020 05/10/2020 03/27/2020 02/28/2020 02/27/2020 02/21/2020 06/28/9677  Systolic BP 938 101 751 025 852 778 242  Diastolic BP 91 90 80 80 86 62 68  Wt. (Lbs) 213.8 209.66 206 206.8 209 201.8 208  BMI 30.68 30.08 29.56 29.67 29.99 28.96 29.84   Foot/eye exam completion dates Latest Ref Rng & Units 02/21/2018 10/28/2016  Eye Exam No Retinopathy - -  Foot Form Completion - Done Done

## 2020-05-27 NOTE — Assessment & Plan Note (Signed)
Reports co,mpliance since recent hospitalization and states that he feels 100 % better

## 2020-05-27 NOTE — Assessment & Plan Note (Signed)
Patient in for follow up of recent hospitalization. Discharge summary, and laboratory and radiology data are reviewed, and any questions or concerns about recent hospitalization are discussed. Specific issues requiring follow up are specifically addressed.  

## 2020-05-27 NOTE — Patient Instructions (Addendum)
F/u in September as before, call if you need me sooner  NEED to make and keep appointment with Dr Dorris Fetch, diabetes is out of control and worsening and this is not good for your heart  You have poor feeling in your feet, protect them and check every day to ensure no cut present    We will send one month only of your insulin to the pharmacy, Dr Paula Libra from office today  Stop all beer and alcohol to protect your heart  Thankful you are better since recent hospitalization  Thanks for choosing [facility], we consider it a privelige to serve you.

## 2020-05-27 NOTE — Assessment & Plan Note (Signed)
Elevated at visit, will ned medication adjustment  Most likely, has upcoming Cardiology appt and will have Cardiology re evaluate a nd adjust as needed

## 2020-05-28 ENCOUNTER — Ambulatory Visit (INDEPENDENT_AMBULATORY_CARE_PROVIDER_SITE_OTHER): Payer: BC Managed Care – PPO | Admitting: "Endocrinology

## 2020-05-28 ENCOUNTER — Ambulatory Visit (INDEPENDENT_AMBULATORY_CARE_PROVIDER_SITE_OTHER): Payer: BC Managed Care – PPO | Admitting: Family Medicine

## 2020-05-28 ENCOUNTER — Other Ambulatory Visit (HOSPITAL_COMMUNITY)
Admission: RE | Admit: 2020-05-28 | Discharge: 2020-05-28 | Disposition: A | Discharge status: Home / Self Care | Payer: BC Managed Care – PPO | Source: Other Acute Inpatient Hospital | Attending: Family Medicine | Admitting: Family Medicine

## 2020-05-28 ENCOUNTER — Encounter: Payer: Self-pay | Admitting: Family Medicine

## 2020-05-28 ENCOUNTER — Encounter: Payer: Self-pay | Admitting: "Endocrinology

## 2020-05-28 ENCOUNTER — Other Ambulatory Visit: Payer: Self-pay | Admitting: *Deleted

## 2020-05-28 VITALS — BP 122/82 | HR 82 | Temp 97.3°F | Resp 15 | Ht 70.0 in | Wt 212.0 lb

## 2020-05-28 VITALS — BP 135/83 | HR 81 | Ht 70.0 in | Wt 214.7 lb

## 2020-05-28 DIAGNOSIS — E1159 Type 2 diabetes mellitus with other circulatory complications: Secondary | ICD-10-CM | POA: Insufficient documentation

## 2020-05-28 DIAGNOSIS — M109 Gout, unspecified: Secondary | ICD-10-CM | POA: Insufficient documentation

## 2020-05-28 DIAGNOSIS — Z91199 Patient's noncompliance with other medical treatment and regimen due to unspecified reason: Secondary | ICD-10-CM

## 2020-05-28 DIAGNOSIS — E782 Mixed hyperlipidemia: Secondary | ICD-10-CM | POA: Diagnosis not present

## 2020-05-28 DIAGNOSIS — M25532 Pain in left wrist: Secondary | ICD-10-CM | POA: Diagnosis not present

## 2020-05-28 DIAGNOSIS — I10 Essential (primary) hypertension: Secondary | ICD-10-CM | POA: Diagnosis not present

## 2020-05-28 DIAGNOSIS — Z9119 Patient's noncompliance with other medical treatment and regimen: Secondary | ICD-10-CM | POA: Diagnosis not present

## 2020-05-28 MED ORDER — PREDNISONE 20 MG PO TABS
ORAL_TABLET | ORAL | 0 refills | Status: DC
Start: 2020-05-28 — End: 2020-06-10

## 2020-05-28 MED ORDER — KETOROLAC TROMETHAMINE 60 MG/2ML IM SOLN
60.0000 mg | Freq: Once | INTRAMUSCULAR | Status: AC
  Administered 2020-05-28: 60 mg via INTRAMUSCULAR

## 2020-05-28 MED ORDER — INSULIN ASPART 100 UNIT/ML FLEXPEN: 10.0000 [IU] | PEN_INJECTOR | Freq: Three times a day (TID) | SUBCUTANEOUS | 3 refills | Status: DC

## 2020-05-28 MED ORDER — LANTUS SOLOSTAR 100 UNIT/ML ~~LOC~~ SOPN: 50.0000 [IU] | PEN_INJECTOR | Freq: Every day | SUBCUTANEOUS | 2 refills | Status: DC

## 2020-05-28 MED ORDER — METHYLPREDNISOLONE ACETATE 80 MG/ML IJ SUSP
80.0000 mg | Freq: Once | INTRAMUSCULAR | Status: AC
  Administered 2020-05-28: 80 mg via INTRAMUSCULAR

## 2020-05-28 NOTE — Progress Notes (Signed)
   Antonio Cobb     MRN: [id number]      DOB: [birth year]   HPI [name] is here with a 2 day h/o pain and swelling of left hand , no inciting trauma, no h/o gout  ROS See HPI  Denies recent fever or chills. Denies sinus pressure, nasal congestion, ear pain or sore throat. Denies chest congestion, productive cough or wheezing. Denies chest pains, palpitations and leg swelling    PE  BP 122/82   Pulse 82   Temp (!) 97.3 F (36.3 C) (Temporal)   Resp 15   Ht 5\' 10"  (1.778 m)   Wt 212 lb (96.2 kg)   SpO2 98%   BMI 30.42 kg/m   Patient alert and oriented and in no cardiopulmonary distress.  HEENT: No facial asymmetry, EOMI,     Neck supple .  Chest: Clear to auscultation bilaterally.  CVS: S1, S2 no murmurs, no S3.Regular rate.    VFI:EPPI hand and forearm swelling , otherwise normal  MS: Adequate ROM spine, shoulders, hips and knees.Markedly reduced ROM left wrist which is also tender and swollen  Skin: Intact, erythema and warmth of left hand  Psych: Good eye contact, normal affect. Memory intact not anxious or depressed appearing.  CNS: CN 2-12 intact, power,  normal throughout.no focal deficits noted.   Assessment & Plan  Acute pain of left wrist Uncontrolled.Toradol and depo medrol administered IM in the office , to be followed by a short course of oral prednisone .   Gout attack Presumed gout, check uric acid level, will start allopurinol in  To 10 days if elevated  Essential hypertension Controlled, no change in medication

## 2020-05-28 NOTE — Patient Instructions (Addendum)
F/U as before , call if you need me before  You are treated for acute gout of left wrist, Toradol 60 mg and depo medrol 80 mg IM in office followed by short course of prednisone  Uric acid added to lab order today   Gout  Gout is painful swelling of your joints. Gout is a type of arthritis. It is caused by having too much uric acid in your body. Uric acid is a chemical that is made when your body breaks down substances called purines. If your body has too much uric acid, sharp crystals can form and build up in your joints. This causes pain and swelling. Gout attacks can happen quickly and be very painful (acute gout). Over time, the attacks can affect more joints and happen more often (chronic gout). What are the causes?  Too much uric acid in your blood. This can happen because: ? Your kidneys do not remove enough uric acid from your blood. ? Your body makes too much uric acid. ? You eat too many foods that are high in purines. These foods include organ meats, some seafood, and beer.  Trauma or stress. What increases the risk?  Having a family history of gout.  Being male and middle-aged.  Being male and having gone through menopause.  Being very overweight (obese).  Drinking alcohol, especially beer.  Not having enough water in the body (being dehydrated).  Losing weight too quickly.  Having an organ transplant.  Having lead poisoning.  Taking certain medicines.  Having kidney disease.  Having a skin condition called psoriasis. What are the signs or symptoms? An attack of acute gout usually happens in just one joint. The most common place is the big toe. Attacks often start at night. Other joints that may be affected include joints of the feet, ankle, knee, fingers, wrist, or elbow. Symptoms of an attack may include:  Very bad pain.  Warmth.  Swelling.  Stiffness.  Shiny, red, or purple skin.  Tenderness. The affected joint may be very painful to  touch.  Chills and fever. Chronic gout may cause symptoms more often. More joints may be involved. You may also have white or yellow lumps (tophi) on your hands or feet or in other areas near your joints. How is this treated?  Treatment for this condition has two phases: treating an acute attack and preventing future attacks.  Acute gout treatment may include: ? NSAIDs. ? Steroids. These are taken by mouth or injected into a joint. ? Colchicine. This medicine relieves pain and swelling. It can be given by mouth or through an IV tube.  Preventive treatment may include: ? Taking small doses of NSAIDs or colchicine daily. ? Using a medicine that reduces uric acid levels in your blood. ? Making changes to your diet. You may need to see a food expert (dietitian) about what to eat and drink to prevent gout. Follow these instructions at home: During a gout attack   If told, put ice on the painful area: ? Put ice in a plastic bag. ? Place a towel between your skin and the bag. ? Leave the ice on for 20 minutes, 2-3 times a day.  Raise (elevate) the painful joint above the level of your heart as often as you can.  Rest the joint as much as possible. If the joint is in your leg, you may be given crutches.  Follow instructions from your doctor about what you cannot eat or drink. Avoiding future gout attacks  Eat a low-purine diet. Avoid foods and drinks such as: ? Liver. ? Kidney. ? Anchovies. ? Asparagus. ? Herring. ? Mushrooms. ? Mussels. ? Beer.  Stay at a healthy weight. If you want to lose weight, talk with your doctor. Do not lose weight too fast.  Start or continue an exercise plan as told by your doctor. Eating and drinking  Drink enough fluids to keep your pee (urine) pale yellow.  If you drink alcohol: ? Limit how much you use to:  0-1 drink a day for women.  0-2 drinks a day for men. ? Be aware of how much alcohol is in your drink. In the [place] one drink equals  one 12 oz bottle of beer (355 mL), one 5 oz glass of wine (148 mL), or one 1 oz glass of hard liquor (44 mL). General instructions  Take over-the-counter and prescription medicines only as told by your doctor.  Do not drive or use heavy machinery while taking prescription pain medicine.  Return to your normal activities as told by your doctor. Ask your doctor what activities are safe for you.  Keep all follow-up visits as told by your doctor. This is important. Contact a doctor if:  You have another gout attack.  You still have symptoms of a gout attack after 10 days of treatment.  You have problems (side effects) because of your medicines.  You have chills or a fever.  You have burning pain when you pee (urinate).  You have pain in your lower back or belly. Get help right away if:  You have very bad pain.  Your pain cannot be controlled.  You cannot pee. Summary  Gout is painful swelling of the joints.  The most common site of pain is the big toe, but it can affect other joints.  Medicines and avoiding some foods can help to prevent and treat gout attacks. This information is not intended to replace advice given to you by your health care provider. Make sure you discuss any questions you have with your health care provider. Document Revised: 06/22/2018 Document Reviewed: 06/22/2018 Elsevier Patient Education  [place].

## 2020-05-28 NOTE — Assessment & Plan Note (Signed)
Uncontrolled.Toradol and depo medrol administered IM in the office , to be followed by a short course of oral prednisone   

## 2020-05-28 NOTE — Progress Notes (Signed)
05/28/2020, 5:50 PM                                                    Endocrinology Telephone Visit Follow up Note -During COVID -19 Pandemic   Subjective:    Patient ID: Antonio Cobb, male    DOB: 08/06/1961.  he is being engaged in telephone phone visit for follow-up of his currently uncontrolled symptomatic type 2 diabetes, hyperlipidemia. PMD :  [name], MD.   Past Medical History:  Diagnosis Date  . Acute CHF (congestive heart failure) ([place]) 01/21/2020  . Acute on chronic combined systolic and diastolic CHF (congestive heart failure) ([place])   . Alcohol abuse   . CAD (coronary artery disease)    a. s/p inferolateral STEMI in 05/2012 with DES to LCx b. patent stent by cath in 05/2016 with moderate RCA and LAD disease  . Cardiomyopathy ([place])    a. LVEF 30-35% by echo in 09/2016. b. 08/2017: echo showing EF remains reduced at 25-30% c. s/p [employer] ICD placement in 01/2018  . CHF (congestive heart failure) ([place])   . CHF exacerbation ([place]) 11/18/2019  . COPD (chronic obstructive pulmonary disease) ([place])   . Diabetes mellitus, type 2 ([place])   . Essential hypertension   . Hypertensive urgency   . Mitral regurgitation    Moderate  . Mixed hyperlipidemia   . Myocardial infarction ([place]) 05/23/2012   Inferolateral STEMI  . Obesity    Past Surgical History:  Procedure Laterality Date  . CARDIAC CATHETERIZATION  2 yrs ago  . CARDIAC CATHETERIZATION N/A 05/15/2016   Procedure: Right/Left Heart Cath and Coronary Angiography;  Surgeon: [name], MD;  Location: [facility];  Service: Cardiovascular;  Laterality: N/A;  . CORONARY STENT PLACEMENT  05/23/12  . ICD IMPLANT N/A 02/01/2018   Procedure: ICD IMPLANT;  Surgeon: Evans Lance, MD;  Location: [facility];  Service: Cardiovascular;  Laterality: N/A;  . LEFT HEART CATHETERIZATION WITH CORONARY  ANGIOGRAM N/A 05/23/2012   Procedure: LEFT HEART CATHETERIZATION WITH CORONARY ANGIOGRAM;  Surgeon: [name], MD;  Location: [facility] CATH [facility];  Service: Cardiovascular;  Laterality: N/A;  . None    . PERCUTANEOUS CORONARY STENT INTERVENTION (PCI-S) N/A 05/23/2012   Procedure: PERCUTANEOUS CORONARY STENT INTERVENTION (PCI-S);  Surgeon: [name], MD;  Location: [facility] CATH LAB;  Service: Cardiovascular;  Laterality: N/A;  . POLYPECTOMY  10/16/2011   Procedure: POLYPECTOMY;  Surgeon: [name], MD;  Location: [facility];  Service: Endoscopy;;  Polypoid Lesion, Transverse and Sigmoid Colon   Social History   Socioeconomic History  . Marital status: Married    Spouse name: Not on file  . Number of children: Not on file  . Years of education: Not on file  . Highest education level: Not on file  Occupational History  . Occupation: Therapist, sports: [employer]  Tobacco Use  . Smoking status: Former  Smoker    Packs/day: 0.25    Years: 30.00    Pack years: 7.50    Types: Cigars    Quit date: 05/23/2016    Years since quitting: 4.0  . Smokeless tobacco: Never Used  . Tobacco comment: since early 56s  Vaping Use  . Vaping Use: Never used  Substance and Sexual Activity  . Alcohol use: Yes    Alcohol/week: 0.0 standard drinks    Comment: occ  . Drug use: No  . Sexual activity: Not Currently  Other Topics Concern  . Not on file  Social History Narrative  . Not on file   Social Determinants of Health   Financial Resource Strain:   . Difficulty of Paying Living Expenses:   Food Insecurity:   . Worried About Charity fundraiser in the Last Year:   . Arboriculturist in the Last Year:   Transportation Needs:   . Film/video editor (Medical):   [name] Lack of Transportation (Non-Medical):   Physical Activity:   . Days of Exercise per Week:   . Minutes of Exercise per Session:   Stress:   . Feeling of Stress :   Social Connections:   . Frequency of Communication with  Friends and Family:   . Frequency of Social Gatherings with Friends and Family:   . Attends Religious Services:   . Active Member of Clubs or Organizations:   . Attends Archivist Meetings:   [name] Marital Status:    Outpatient Encounter Medications as of 05/28/2020  Medication Sig  . aspirin EC 81 MG EC tablet Take 1 tablet (81 mg total) by mouth daily with breakfast.  . docusate sodium (COLACE) 100 MG capsule Take 100 mg by mouth daily.  . famotidine (PEPCID) 20 MG tablet Take 1 tablet (20 mg total) by mouth at bedtime.  [name] glucose blood (FREESTYLE LITE) test strip TEST FOUR TIMES DAILY  . hydrOXYzine (ATARAX/VISTARIL) 25 MG tablet Take one tablet by mouth at bedtime, for anxiety and sleep  . insulin aspart (NOVOLOG) 100 UNIT/ML FlexPen Inject 10-16 Units into the skin 3 (three) times daily with meals.  . insulin glargine (LANTUS SOLOSTAR) 100 UNIT/ML Solostar Pen Inject 50 Units into the skin at bedtime.  [name] loratadine (CLARITIN) 10 MG tablet Take 1 tablet (10 mg total) by mouth daily.  [name] losartan (COZAAR) 25 MG tablet Take 1 tablet (25 mg total) by mouth daily.  . Melatonin 10 MG TABS Take 2 tablets by mouth at bedtime.  . metoprolol succinate (TOPROL-XL) 50 MG 24 hr tablet TAKE 1 TABLET BY MOUTH EVERY DAY WITH FOOD (Patient taking differently: Take 50 mg by mouth daily. TAKE 1 TABLET BY MOUTH EVERY DAY WITH FOOD)  . Multiple Vitamins-Minerals (MULTIVITAMIN WITH MINERALS) tablet Take 1 tablet by mouth daily.  . pantoprazole (PROTONIX) 40 MG tablet Take 1 tablet (40 mg total) by mouth daily. Take 30-21min before first meal of the day.  . potassium chloride SA (KLOR-CON) 20 MEQ tablet Take 1 tablet (20 mEq total) by mouth daily.  . predniSONE (DELTASONE) 20 MG tablet Take one tablet by mouth three times daily for 2 days, then take one tablet two times daily for 2 days , then take one tablet once daily for 2 days, then stop  . rosuvastatin (CRESTOR) 40 MG tablet Take 1 tablet (40 mg  total) by mouth daily.  . sertraline (ZOLOFT) 25 MG tablet Take 1 tablet (25 mg total) by mouth daily. For anxiety/panic  attacks  . spironolactone (ALDACTONE) 25 MG tablet Take 0.5 tablets (12.5 mg total) by mouth daily.  [name] torsemide (DEMADEX) 20 MG tablet Take 3 tablets (60 mg total) by mouth daily.  . [DISCONTINUED] insulin glargine (LANTUS SOLOSTAR) 100 UNIT/ML Solostar Pen ADMINISTER 50 UNITS UNDER THE SKIN EVERY NIGHT AT BEDTIME  . [DISCONTINUED] insulin regular (NOVOLIN R) 100 units/mL injection Inject 0.1 mLs (10 Units total) into the skin 3 (three) times daily before meals.   No facility-administered encounter medications on file as of 05/28/2020.    ALLERGIES: Allergies  Allergen Reactions  . Entresto [Sacubitril-Valsartan]     Dizziness, Coughing  . Other     Seasonal allergies  - has to use inhaler    VACCINATION STATUS: Immunization History  Administered Date(s) Administered  . Influenza Split 11/11/2011, 09/07/2012  . Influenza,inj,Quad PF,6+ Mos 03/12/2016, 10/08/2016, 09/14/2017, 01/30/2019, 01/30/2019, 10/11/2019  . Moderna SARS-COVID-2 Vaccination 02/11/2020, 03/16/2020  . Pneumococcal Polysaccharide-23 08/13/2011, 10/08/2016  . Td 07/01/2004  . Tdap 07/05/2014    Diabetes He presents for his follow-up diabetic visit. He has type 2 diabetes mellitus. Onset time: She was diagnosed at approximate age of 32 years. His disease course has been worsening (Patient was seen prior to 3 years ago in this clinic, no showed for unclear reasons. He did have a history of noncompliance/nonadherence.). There are no hypoglycemic associated symptoms. Pertinent negatives for hypoglycemia include no confusion, pallor or seizures. Associated symptoms include polydipsia and polyuria. Pertinent negatives for diabetes include no fatigue, no polyphagia and no weakness. There are no hypoglycemic complications. Symptoms are worsening. Diabetic complications include heart disease and impotence.  Risk factors for coronary artery disease include diabetes mellitus, dyslipidemia, hypertension, male sex, sedentary lifestyle, tobacco exposure, family history and obesity. He is compliant with treatment none of the time. He is following a generally unhealthy diet. When asked about meal planning, he reported none. He has not had a previous visit with a dietitian. He never participates in exercise. His overall blood glucose range is >200 mg/dl. (This patient is known to have alarming noncompliance to follow-ups and treatment regimens/recommendations.  He missed his last appointment, returns only after recent ER visit and urging from his PMD due to recent A1c of 10.9%.  He claims that he has not been using his Lantus.  He is not monitoring blood glucose regularly.  Did not bring any logs nor meter to review.  He is accompanied by his wife.) An ACE inhibitor/angiotensin II receptor blocker is being taken. He does not see a podiatrist.Eye exam is current.  Hyperlipidemia This is a chronic problem. The current episode started more than 1 year ago. Exacerbating diseases include diabetes. Pertinent negatives include no myalgias. Current antihyperlipidemic treatment includes statins. Risk factors for coronary artery disease include diabetes mellitus, dyslipidemia, hypertension and a sedentary lifestyle.     Objective:    BP 135/83   Pulse 81   Ht 5\' 10"  (1.778 m)   Wt 214 lb 11.2 oz (97.4 kg)   BMI 30.81 kg/m   Wt Readings from Last 3 Encounters:  05/28/20 214 lb 11.2 oz (97.4 kg)  05/28/20 212 lb (96.2 kg)  05/27/20 213 lb 12.8 oz (97 kg)     CMP ( most recent) CMP     Component Value Date/Time   NA 138 05/10/2020 0717   K 3.6 05/10/2020 0717   CL 100 05/10/2020 0717   CO2 24 05/10/2020 0717   GLUCOSE 162 (H) 05/10/2020 0717   BUN 24 (H) 05/10/2020  0717   CREATININE 1.19 05/10/2020 0717   CREATININE 1.85 (H) 11/30/2019 1550   CALCIUM 9.2 05/10/2020 0717   PROT 7.5 05/10/2020 0717    ALBUMIN 3.7 05/10/2020 0717   AST 24 05/10/2020 0717   ALT 25 05/10/2020 0717   ALKPHOS 86 05/10/2020 0717   BILITOT 1.0 05/10/2020 0717   GFRNONAA >60 05/10/2020 0717   GFRNONAA 39 (L) 11/30/2019 1550   GFRAA >60 05/10/2020 0717   GFRAA 45 (L) 11/30/2019 1550     Diabetic Labs (most recent): Lab Results  Component Value Date   HGBA1C 10.2 (H) 05/08/2020   HGBA1C 9.5 (H) 01/22/2020   HGBA1C 9.7 (H) 01/21/2020   Lipid Panel     Component Value Date/Time   CHOL 249 (H) 05/09/2020 0713   TRIG 267 (H) 05/09/2020 0713   HDL 47 05/09/2020 0713   CHOLHDL 5.3 05/09/2020 0713   VLDL 53 (H) 05/09/2020 0713   LDLCALC 149 (H) 05/09/2020 0713   LDLCALC 82 10/23/2019 0955   LDLDIRECT TEST NOT PERFORMED 06/19/2011 1230      Lab Results  Component Value Date   TSH 1.333 11/13/2019   TSH 1.64 01/24/2019   TSH 1.38 03/12/2016   TSH 1.789 12/30/2012   TSH 2.849 11/11/2011   TSH 1.597 12/11/2009      Assessment & Plan:   1. DM type 2 causing vascular disease ([place])  - Patient has currently uncontrolled symptomatic type 2 DM since  59 years of age.  This patient is known to have alarming noncompliance to follow-ups and treatment regimens/recommendations.  He missed his last appointment, returns only after recent ER visit and urging from his PMD due to recent A1c of 10.2%.  He claims that he has not been using his Lantus.  He is not monitoring blood glucose regularly.  Did not bring any logs nor meter to review.  He is accompanied by his wife who is offering to help him manage his diabetes.  Recent labs reviewed.  -his diabetes is complicated by coronary artery disease, noncompliance/nonadherence, and Karnell A Mastro remains at a high risk for more acute and chronic complications which include CAD, CVA, CKD, retinopathy, and neuropathy. These are all discussed in detail with the patient.  -I had a long discussion with him and his wife about complications of uncontrolled diabetes  and individualized plan for him to use to control diabetes to target. - I have counseled him on diet management  by adopting a carbohydrate restricted/protein rich diet.  - he  admits there is a room for improvement in his diet and drink choices. -  Suggestion is made for him to avoid simple carbohydrates  from his diet including Cakes, Sweet Desserts / Pastries, Ice Cream, Soda (diet and regular), Sweet Tea, Candies, Chips, Cookies, Sweet Pastries,  Store Bought Juices, Alcohol in Excess of  1-2 drinks a day, Artificial Sweeteners, Coffee Creamer, and "Sugar-free" Products. This will help patient to have stable blood glucose profile and potentially avoid unintended weight gain.  - I encouraged him to switch to  unprocessed or minimally processed complex starch and increased protein intake (animal or plant source), fruits, and vegetables.  -He does not monitor blood glucose regularly, reports random glycemic profile greater than 200 mg per DL on average.  - he is advised to stick to a routine mealtimes to eat 3 meals  a day and avoid unnecessary snacks ( to snack only to correct hypoglycemia).   - I have approached him with the following  individualized plan to manage diabetes and patient agrees:   -Patient admits that he has not been taking his Lantus consistently, has been taking regular insulin 20 to 25 units 3 times daily AC.   -He is approached again for basal/bolus insulin.  Is advised to resume Lantus at 50 units nightly, switch his regular insulin to NovoLog 10 units 3 times a day with meals  for pre-meal BG readings of 90-150mg /dl, plus patient specific correction dose for unexpected hyperglycemia above 150mg /dl, associated with strict monitoring of glucose 4 times a day-before meals and at bedtime.  - Patient is warned not to take insulin without proper monitoring per orders. -Adjustment parameters are given for hypo and hyperglycemia in writing. -Patient is encouraged to call clinic for  blood glucose levels less than 70 or above 300 mg /dl. -  He does not tolerate metformin.  -Patient is not a candidate for  SGLT2 SGLT2 inhibitors due to CKD.  2) Lipids/HPL:   His most recent LDL was uncontrolled at 149, increasing from 76.  He is advised to resume and continue Crestor 40 mg p.o. nightly.  Side effects and precautions discussed with him.     3) hypertension: His blood pressure is controlled to target.  He is advised to continue losartan 25 mg daily, spironolactone 25 mg p.o. daily.  He also has torsemide as needed.    - I advised patient to maintain close follow up with [name], MD for primary care needs.  - Time spent on this patient care encounter:  40 min, of which > 50% was spent in  counseling and the rest reviewing his blood glucose logs , discussing his hypoglycemia and hyperglycemia episodes, reviewing his current and  previous labs / studies  ( including abstraction from other facilities) and medications  doses and developing a  long term treatment plan and documenting his care.   Please refer to Patient Instructions for Blood Glucose Monitoring and Insulin/Medications Dosing Guide"  in media tab for additional information. Please  also refer to " Patient Self Inventory" in the Media  tab for reviewed elements of pertinent patient history.  Antonio Cobb participated in the discussions, expressed understanding, and voiced agreement with the above plans.  All questions were answered to his satisfaction. he is encouraged to contact clinic should he have any questions or concerns prior to his return visit.    Follow up plan: - Return in about 2 weeks (around 06/11/2020) for F/U with Meter and Logs Only - no Labs.  Glade Lloyd, MD [facility] [address] [place], [place] [address] Phone: [phone]  Fax: [phone]    05/28/2020, 5:50 PM  This note was partially dictated with voice  recognition software. Similar sounding words can be transcribed inadequately or may not  be corrected upon review.

## 2020-05-28 NOTE — Patient Instructions (Signed)

## 2020-05-29 ENCOUNTER — Other Ambulatory Visit: Payer: Self-pay | Admitting: Family Medicine

## 2020-05-29 LAB — CBC
HCT: 41.2 % (ref 38.5–50.0)
Hemoglobin: 13.7 g/dL (ref 13.2–17.1)
MCH: 27.7 pg (ref 27.0–33.0)
MCHC: 33.3 g/dL (ref 32.0–36.0)
MCV: 83.4 fL (ref 80.0–100.0)
MPV: 10.9 fL (ref 7.5–12.5)
Platelets: 215 10*3/uL (ref 140–400)
RBC: 4.94 10*6/uL (ref 4.20–5.80)
RDW: 13.3 % (ref 11.0–15.0)
WBC: 10.5 10*3/uL (ref 3.8–10.8)

## 2020-05-29 LAB — BASIC METABOLIC PANEL WITH GFR
BUN/Creatinine Ratio: 17 (calc) (ref 6–22)
BUN: 26 mg/dL — ABNORMAL HIGH (ref 7–25)
CO2: 29 mmol/L (ref 20–32)
Calcium: 9.5 mg/dL (ref 8.6–10.3)
Chloride: 98 mmol/L (ref 98–110)
Creat: 1.51 mg/dL — ABNORMAL HIGH (ref 0.70–1.33)
GFR, Est African American: 58 mL/min/{1.73_m2} — ABNORMAL LOW (ref >=60)
GFR, Est Non African American: 50 mL/min/{1.73_m2} — ABNORMAL LOW (ref >=60)
Glucose, Bld: 224 mg/dL — ABNORMAL HIGH (ref 65–139)
Potassium: 3.8 mmol/L (ref 3.5–5.3)
Sodium: 139 mmol/L (ref 135–146)

## 2020-05-29 LAB — MICROALBUMIN, URINE: Microalb, Ur: 3.8 ug/mL — ABNORMAL HIGH

## 2020-05-29 LAB — URIC ACID: Uric Acid, Serum: 10.8 mg/dL — ABNORMAL HIGH (ref 4.0–8.0)

## 2020-05-29 MED ORDER — ALLOPURINOL 300 MG PO TABS: 300.0000 mg | ORAL_TABLET | Freq: Every day | ORAL | 6 refills | Status: DC

## 2020-05-29 NOTE — Progress Notes (Signed)
Allopurinol 300

## 2020-05-31 ENCOUNTER — Encounter: Payer: Self-pay | Admitting: Family Medicine

## 2020-05-31 NOTE — Assessment & Plan Note (Signed)
Controlled, no change in medication  

## 2020-05-31 NOTE — Assessment & Plan Note (Signed)
Presumed gout, check uric acid level, will start allopurinol in  To 10 days if elevated

## 2020-06-01 ENCOUNTER — Other Ambulatory Visit: Payer: Self-pay | Admitting: Family Medicine

## 2020-06-05 ENCOUNTER — Ambulatory Visit: Payer: BC Managed Care – PPO | Admitting: Cardiology

## 2020-06-05 NOTE — Progress Notes (Deleted)
Clinical Summary Antonio Cobb is a 59 y.o.male seen today for follow up of the following medical problems.   1. CAD - hx of inferolateral STEMI June 2013, received DES to LCX. LVEF at that time 55%.  - echo 04/2016 LVEF 30-35% - 05/2016 cath patent LCX stent, moderate RCA and LAD disease.    - no recent chest pain. No SOB or DOE.   2. Chronic systolic HF - drop in LVEF noted by echo 04/2016, cath at that time did not show any new significant obstructive CAD - 09/2016 echo LVEF 30-35%, moderate MR.  - 10/2019 echo LVEF 25%   - ICD followed by Dr Lovena Le. 02/2020 normal function.    - entresto caused cough and dizzy spells, now just on losartan. - 08/2017 echo LVEF 56-43%,PIRJJ II diastolic dysfunction.  - admit 08/2017 with fluid overload. Diuresed 6L, discharge weight 203 lbs. - no recent SOB/DOE. No recent edema.  - home weights around stable around 200 - compliant with meds   - off aldactone during 01/2020 admission, had some low bp's - home weight 202 lbs.  - has multiple recent admissions with severe HTN and flash pulmonary edema starting in the middle of the night, thought to be associated with OSA. Has been referred to pulmonary  2. Hyperlipidemia - compliant with statin  3. HTN -compliant with meds  4. COPD - followed by pcp and Dr Luan Pulling -  06/2017 PFTs mild to moderate ventilaroty defect  5. OSA - followed by Dr Halford Chessman.   6. Mitral regurgitation - moderate by echo 08/2017 - no recent symptoms   Past Medical History:  Diagnosis Date  . Acute CHF (congestive heart failure) ([place]) 01/21/2020  . Acute on chronic combined systolic and diastolic CHF (congestive heart failure) ([place])   . Acute respiratory failure with hypoxia and hypercapnia ([facility])   . Acute systolic CHF (congestive heart failure) ([place]) 05/08/2020  . Alcohol abuse   . CAD (coronary artery disease)    a. s/p inferolateral STEMI in 05/2012 with DES to LCx b. patent stent by cath  in 05/2016 with moderate RCA and LAD disease  . Cardiomyopathy ([place])    a. LVEF 30-35% by echo in 09/2016. b. 08/2017: echo showing EF remains reduced at 25-30% c. s/p [employer] ICD placement in 01/2018  . CHF (congestive heart failure) ([place])   . CHF exacerbation ([place]) 11/18/2019  . COPD (chronic obstructive pulmonary disease) ([place])   . Diabetes mellitus, type 2 ([place])   . Essential hypertension   . Hypertensive urgency   . Mitral regurgitation    Moderate  . Mixed hyperlipidemia   . Myocardial infarction ([place]) 05/23/2012   Inferolateral STEMI  . Obesity      Allergies  Allergen Reactions  . Entresto [Sacubitril-Valsartan]     Dizziness, Coughing  . Other     Seasonal allergies  - has to use inhaler     Current Outpatient Medications  Medication Sig Dispense Refill  . [START ON 06/07/2020] allopurinol (ZYLOPRIM) 300 MG tablet Take 1 tablet (300 mg total) by mouth daily. 30 tablet 6  . aspirin EC 81 MG EC tablet Take 1 tablet (81 mg total) by mouth daily with breakfast. 30 tablet 3  . docusate sodium (COLACE) 100 MG capsule Take 100 mg by mouth daily.    . famotidine (PEPCID) 20 MG tablet Take 1 tablet (20 mg total) by mouth at bedtime.    [name] glucose blood (FREESTYLE LITE) test strip USE TO  TEST FOUR TIMES DAILY 100 strip 0  . hydrOXYzine (ATARAX/VISTARIL) 25 MG tablet Take one tablet by mouth at bedtime, for anxiety and sleep 30 tablet 5  . insulin aspart (NOVOLOG) 100 UNIT/ML FlexPen Inject 10-16 Units into the skin 3 (three) times daily with meals. 5 pen 3  . insulin glargine (LANTUS SOLOSTAR) 100 UNIT/ML Solostar Pen Inject 50 Units into the skin at bedtime. 10 pen 2  . loratadine (CLARITIN) 10 MG tablet Take 1 tablet (10 mg total) by mouth daily. 30 tablet 2  . losartan (COZAAR) 25 MG tablet Take 1 tablet (25 mg total) by mouth daily. 30 tablet 5  . Melatonin 10 MG TABS Take 2 tablets by mouth at bedtime.    . metoprolol succinate (TOPROL-XL) 50 MG 24 hr tablet TAKE 1  TABLET BY MOUTH EVERY DAY WITH FOOD (Patient taking differently: Take 50 mg by mouth daily. TAKE 1 TABLET BY MOUTH EVERY DAY WITH FOOD) 90 tablet 3  . Multiple Vitamins-Minerals (MULTIVITAMIN WITH MINERALS) tablet Take 1 tablet by mouth daily. 120 tablet 2  . pantoprazole (PROTONIX) 40 MG tablet Take 1 tablet (40 mg total) by mouth daily. Take 30-25min before first meal of the day. 30 tablet 5  . potassium chloride SA (KLOR-CON) 20 MEQ tablet Take 1 tablet (20 mEq total) by mouth daily. 90 tablet 3  . predniSONE (DELTASONE) 20 MG tablet Take one tablet by mouth three times daily for 2 days, then take one tablet two times daily for 2 days , then take one tablet once daily for 2 days, then stop 12 tablet 0  . rosuvastatin (CRESTOR) 40 MG tablet Take 1 tablet (40 mg total) by mouth daily. 30 tablet 3  . sertraline (ZOLOFT) 25 MG tablet Take 1 tablet (25 mg total) by mouth daily. For anxiety/panic attacks 30 tablet 5  . spironolactone (ALDACTONE) 25 MG tablet Take 0.5 tablets (12.5 mg total) by mouth daily. 45 tablet 3  . torsemide (DEMADEX) 20 MG tablet Take 3 tablets (60 mg total) by mouth daily. 30 tablet 1   No current facility-administered medications for this visit.     Past Surgical History:  Procedure Laterality Date  . CARDIAC CATHETERIZATION  2 yrs ago  . CARDIAC CATHETERIZATION N/A 05/15/2016   Procedure: Right/Left Heart Cath and Coronary Angiography;  Surgeon: [name], MD;  Location: [facility];  Service: Cardiovascular;  Laterality: N/A;  . CORONARY STENT PLACEMENT  05/23/12  . ICD IMPLANT N/A 02/01/2018   Procedure: ICD IMPLANT;  Surgeon: Evans Lance, MD;  Location: [facility];  Service: Cardiovascular;  Laterality: N/A;  . LEFT HEART CATHETERIZATION WITH CORONARY ANGIOGRAM N/A 05/23/2012   Procedure: LEFT HEART CATHETERIZATION WITH CORONARY ANGIOGRAM;  Surgeon: [name], MD;  Location: [facility] CATH [facility];  Service: Cardiovascular;  Laterality: N/A;  .  None    . PERCUTANEOUS CORONARY STENT INTERVENTION (PCI-S) N/A 05/23/2012   Procedure: PERCUTANEOUS CORONARY STENT INTERVENTION (PCI-S);  Surgeon: [name], MD;  Location: [facility] CATH LAB;  Service: Cardiovascular;  Laterality: N/A;  . POLYPECTOMY  10/16/2011   Procedure: POLYPECTOMY;  Surgeon: [name], MD;  Location: [facility];  Service: Endoscopy;;  Polypoid Lesion, Transverse and Sigmoid Colon     Allergies  Allergen Reactions  . Entresto [Sacubitril-Valsartan]     Dizziness, Coughing  . Other     Seasonal allergies  - has to use inhaler      Family History  Problem Relation Age of Onset  .  Emphysema Mother   . Heart attack Mother   . Hypertension Mother   . Diabetes Mother   . Heart disease Mother   . COPD Mother   . Emphysema Father   . COPD Father   . Stroke Sister   . Asthma Sister   . Colon cancer Neg Hx   . Anesthesia problems Neg Hx   . Hypotension Neg Hx   . Malignant hyperthermia Neg Hx   . Pseudochol deficiency Neg Hx      Social History Mr. Bates reports that he quit smoking about 4 years ago. His smoking use included cigars. He has a 7.50 pack-year smoking history. He has never used smokeless tobacco. Mr. Buckner reports current alcohol use.   Review of Systems CONSTITUTIONAL: No weight loss, fever, chills, weakness or fatigue.  HEENT: Eyes: No visual loss, blurred vision, double vision or yellow sclerae.No hearing loss, sneezing, congestion, runny nose or sore throat.  SKIN: No rash or itching.  CARDIOVASCULAR:  RESPIRATORY: No shortness of breath, cough or sputum.  GASTROINTESTINAL: No anorexia, nausea, vomiting or diarrhea. No abdominal pain or blood.  GENITOURINARY: No burning on urination, no polyuria NEUROLOGICAL: No headache, dizziness, syncope, paralysis, ataxia, numbness or tingling in the extremities. No change in bowel or bladder control.  MUSCULOSKELETAL: No muscle, back pain, joint pain or stiffness.  LYMPHATICS: No enlarged  nodes. No history of splenectomy.  PSYCHIATRIC: No history of depression or anxiety.  ENDOCRINOLOGIC: No reports of sweating, cold or heat intolerance. No polyuria or polydipsia.  [name]   Physical Examination There were no vitals filed for this visit. There were no vitals filed for this visit.  Gen: resting comfortably, no acute distress HEENT: no scleral icterus, pupils equal round and reactive, no palptable cervical adenopathy,  CV Resp: Clear to auscultation bilaterally GI: abdomen is soft, non-tender, non-distended, normal bowel sounds, no hepatosplenomegaly MSK: extremities are warm, no edema.  Skin: warm, no rash Neuro:  no focal deficits Psych: appropriate affect   Diagnostic Studies Cath 05/2012 [facility] FINDINGS  Hemodynamics:  AO 119/78  LV 121/71  Coronary angiography:  Coronary dominance: right  Left mainstem: The left main is patent with diffuse nonobstructive plaque. The distal left main has 30% stenosis.  Left anterior descending (LAD): the LAD is patent to the LV apex. The mid LAD has 50-60% stenosis at the origin of the second diagonal Khalifa Knecht. The first diagonal is very large in caliber and has no significant obstructive disease.  Left circumflex (LCx): the left circumflex is totally occluded in the mid vessel. There is TIMI 0 flow. Following PCI, there were 2 obtuse marginals and a left posterolateral Nashonda Limberg visualized, all of which are patent.  Right coronary artery (RCA): there is a high anterior origin of the RCA. The mid vessel has diffuse plaque estimated at 50-60%. The vessel is dominant. There is diffuse nonobstructive disease throughout. There is a small PDA and small posterolateral Nehemiah Montee.  Left ventriculography: Left ventricular systolic function is in the low-normal range, LVEF is estimated at 55%, there is hypokinesis of the basal and midinferior wall.  PCI Note: Following the diagnostic procedure, the decision was made to proceed with PCI. The  patient was loaded with Effient 60 mg. Weight-based bivalirudin was given for anticoagulation. Once a therapeutic ACT was achieved, a 6 [place] XB-LAD guide catheter was inserted. A Cougar coronary guidewire was used to cross the lesion. The lesion was predilated with a 2.5x15 mm balloon. The lesion was then stented with a 3.5x15 mm  Resolute drug-eluting stent. Following PCI, there was 0% residual stenosis and TIMI-3 flow. Final angiography confirmed an excellent result. The patient tolerated the procedure well. There were no immediate procedural complications. A TR band was used for radial hemostasis. The patient was transferred to the post catheterization recovery area for further monitoring.  PCI Data:  Vessel - circumflex /Segment - mid  Percent Stenosis (pre) 100  TIMI-flow 0  Stent 3.5 x 15 mm resolute integrity DES  Percent Stenosis (post) 0  TIMI-flow (post) 3  Final Conclusions:  1. Total occlusion of the left circumflex with successful primary PCI using a drug-eluting stent platform  2. Diffuse nonobstructive disease of the right coronary artery and LAD  3. Mild left ventricular dysfunction consistent with inferior MI  Recommendations:  transfer to ICU. Post MI medical therapy will be instituted. Tobacco cessation counseling will be done. Anticipate 48 hour hospitalization if his post MI course is uncomplicated.  09/2016 echo Study Conclusions  - Left ventricle: The cavity size was mildly dilated. Wall thickness was increased in a pattern of mild LVH. Systolic function was moderately to severely reduced. The estimated ejection fraction was in the range of 30% to 35%. There is akinesis of the inferolateral and inferior myocardium. Doppler parameters are consistent with restrictive physiology, indicative of decreased left ventricular diastolic compliance and/or increased left atrial pressure. - Aortic valve: Mildly calcified annulus. Trileaflet. There  was trivial regurgitation. - Mitral valve: Mildly thickened leaflets . There was moderate regurgitation directed posteriorly. - Left atrium: The atrium was mildly dilated. - Right atrium: Central venous pressure (est): 3 mm Hg. - Tricuspid valve: There was mild regurgitation. - Pulmonary arteries: PA peak pressure: 36 mm Hg (S). - Pericardium, extracardiac: There was no pericardial effusion.  Impressions:  - Mild LVH with mild LV chamber dilatation and LVEF 30-35%. Inferior/inferolateral akinesis. Restrictive diastolic filling pattern noted. Mild left atrial enlargement. Mildly thickened mitral leaflets with moderate, posteriorly directed mitral regurgitation. Mildly calcified aortic annulus with trivial aortic regurgitation. Mild tricuspid regurgitation with PASP 36 mmHg.  05/2016 cath  Patent stent in the mid circumflex.  Moderate disease in the RCA and LAD.  Mild pulmonary hypertension.  Cardiac output 5.1 L/m. Cardiac index 2.4  Pulmonary artery saturation 66%.  Cardiomyopathy not related to obstructive coronary artery disease. The patient did state that he drinks alcohol heavily. I did explain him the cutting back on his alcohol may help his cardiac function. Follow-up with Dr. Harl Bowie.   06/2017 PFTs Mild to moderate ventilatory defect with small airway obstruction, reduced TLC, normal DLCO  08/2017 echo Study Conclusions  - Left ventricle: The cavity size was moderately dilated. Wall thickness was increased in a pattern of mild LVH. Systolic function was severely reduced. The estimated ejection fraction was in the range of 25% to 30%. Diffuse hypokinesis. Features are consistent with a pseudonormal left ventricular filling pattern, with concomitant abnormal relaxation and increased filling pressure (grade 2 diastolic dysfunction). Doppler parameters are consistent with high ventricular filling pressure. - Regional wall motion  abnormality: Akinesis of the mid inferior, basal-mid inferolateral, and apical lateral myocardium; severe hypokinesis of the basal inferior myocardium; moderate hypokinesis of the mid anterolateral myocardium. - Aortic valve: There was trivial regurgitation. - Aorta: Mild aortic root dilatation. - Mitral valve: Calcified annulus. Mildly thickened leaflets . There was moderate, posteriorly directed regurgitation. - Left atrium: The atrium was severely dilated. - Atrial septum: The septum bowed from left to right, consistent with increased left atrial pressure. - Tricuspid valve:  There was mild regurgitation. - Pulmonary arteries: PA peak pressure: 34 mm Hg (S).   10/2019 echo IMPRESSIONS    1. Left ventricular ejection fraction, by visual estimation, is 25%. The  left ventricle has severely decreased function. There is mildly increased  left ventricular hypertrophy.  2. Multiple segmental abnormalities exist. See findings.  3. Left ventricular diastolic parameters are indeterminate.  4. Moderately dilated left ventricular internal cavity size.  5. Global right ventricle has normal systolic function.The right  ventricular size is normal. No increase in right ventricular wall  thickness.  6. Left atrial size was normal.  7. Right atrial size was normal.  8. Presence of pericardial fat pad.  9. Mild to moderate aortic valve annular calcification.  10. The mitral valve is grossly normal. Mild mitral valve regurgitation.  11. The tricuspid valve is grossly normal. Tricuspid valve regurgitation  is trivial.  12. The aortic valve is tricuspid. Aortic valve regurgitation is trivial.  13. The pulmonic valve was grossly normal. Pulmonic valve regurgitation is  not visualized.  14. TR signal is inadequate for assessing pulmonary artery systolic  pressure.  15. A pacer wire is visualized.  16. The inferior vena cava is normal in size with greater than 50%    respiratory variability, suggesting right atrial pressure of 3 mmHg.     Assessment and Plan  1. CAD -no symptoms, continue current meds  2. Chronic systolic HF - doing well, continue current meds - restart aldactone 12.5mg  daily  3. . Hyperlipidemia -continue statin  4. HTN -- above goal, restart aldactone at 12.5mg  daily.         Arnoldo Lenis, M.D.

## 2020-06-09 ENCOUNTER — Observation Stay (HOSPITAL_COMMUNITY)
Admission: AD | Admit: 2020-06-09 | Discharge: 2020-06-10 | Disposition: A | Discharge status: Home / Self Care | Payer: BC Managed Care – PPO | Attending: Family Medicine | Admitting: Family Medicine

## 2020-06-09 ENCOUNTER — Emergency Department (HOSPITAL_COMMUNITY): Payer: BC Managed Care – PPO

## 2020-06-09 ENCOUNTER — Encounter (HOSPITAL_COMMUNITY): Payer: Self-pay

## 2020-06-09 ENCOUNTER — Inpatient Hospital Stay (HOSPITAL_COMMUNITY): Payer: BC Managed Care – PPO

## 2020-06-09 ENCOUNTER — Other Ambulatory Visit: Payer: Self-pay

## 2020-06-09 DIAGNOSIS — I110 Hypertensive heart disease with heart failure: Secondary | ICD-10-CM | POA: Insufficient documentation

## 2020-06-09 DIAGNOSIS — R931 Abnormal findings on diagnostic imaging of heart and coronary circulation: Secondary | ICD-10-CM | POA: Diagnosis not present

## 2020-06-09 DIAGNOSIS — Z20822 Contact with and (suspected) exposure to covid-19: Secondary | ICD-10-CM | POA: Diagnosis not present

## 2020-06-09 DIAGNOSIS — R0902 Hypoxemia: Secondary | ICD-10-CM | POA: Diagnosis not present

## 2020-06-09 DIAGNOSIS — E559 Vitamin D deficiency, unspecified: Secondary | ICD-10-CM | POA: Diagnosis present

## 2020-06-09 DIAGNOSIS — J9622 Acute and chronic respiratory failure with hypercapnia: Secondary | ICD-10-CM | POA: Diagnosis present

## 2020-06-09 DIAGNOSIS — Z8679 Personal history of other diseases of the circulatory system: Secondary | ICD-10-CM | POA: Insufficient documentation

## 2020-06-09 DIAGNOSIS — J9601 Acute respiratory failure with hypoxia: Secondary | ICD-10-CM | POA: Diagnosis not present

## 2020-06-09 DIAGNOSIS — Z7982 Long term (current) use of aspirin: Secondary | ICD-10-CM | POA: Diagnosis not present

## 2020-06-09 DIAGNOSIS — J449 Chronic obstructive pulmonary disease, unspecified: Secondary | ICD-10-CM | POA: Diagnosis present

## 2020-06-09 DIAGNOSIS — I5022 Chronic systolic (congestive) heart failure: Secondary | ICD-10-CM | POA: Diagnosis present

## 2020-06-09 DIAGNOSIS — Z794 Long term (current) use of insulin: Secondary | ICD-10-CM

## 2020-06-09 DIAGNOSIS — E119 Type 2 diabetes mellitus without complications: Secondary | ICD-10-CM

## 2020-06-09 DIAGNOSIS — I5021 Acute systolic (congestive) heart failure: Secondary | ICD-10-CM | POA: Diagnosis not present

## 2020-06-09 DIAGNOSIS — Z6829 Body mass index (BMI) 29.0-29.9, adult: Secondary | ICD-10-CM

## 2020-06-09 DIAGNOSIS — I509 Heart failure, unspecified: Secondary | ICD-10-CM

## 2020-06-09 DIAGNOSIS — I5023 Acute on chronic systolic (congestive) heart failure: Secondary | ICD-10-CM | POA: Insufficient documentation

## 2020-06-09 DIAGNOSIS — I10 Essential (primary) hypertension: Secondary | ICD-10-CM

## 2020-06-09 DIAGNOSIS — R000 Tachycardia, unspecified: Secondary | ICD-10-CM | POA: Diagnosis not present

## 2020-06-09 DIAGNOSIS — J9621 Acute and chronic respiratory failure with hypoxia: Secondary | ICD-10-CM | POA: Diagnosis present

## 2020-06-09 DIAGNOSIS — J810 Acute pulmonary edema: Secondary | ICD-10-CM | POA: Diagnosis not present

## 2020-06-09 DIAGNOSIS — E1159 Type 2 diabetes mellitus with other circulatory complications: Secondary | ICD-10-CM

## 2020-06-09 DIAGNOSIS — J811 Chronic pulmonary edema: Secondary | ICD-10-CM | POA: Diagnosis not present

## 2020-06-09 DIAGNOSIS — Z87891 Personal history of nicotine dependence: Secondary | ICD-10-CM | POA: Insufficient documentation

## 2020-06-09 DIAGNOSIS — J9602 Acute respiratory failure with hypercapnia: Secondary | ICD-10-CM | POA: Diagnosis present

## 2020-06-09 DIAGNOSIS — Z79899 Other long term (current) drug therapy: Secondary | ICD-10-CM | POA: Insufficient documentation

## 2020-06-09 DIAGNOSIS — E663 Overweight: Secondary | ICD-10-CM

## 2020-06-09 DIAGNOSIS — R069 Unspecified abnormalities of breathing: Secondary | ICD-10-CM | POA: Diagnosis not present

## 2020-06-09 DIAGNOSIS — Z95810 Presence of automatic (implantable) cardiac defibrillator: Secondary | ICD-10-CM

## 2020-06-09 DIAGNOSIS — E782 Mixed hyperlipidemia: Secondary | ICD-10-CM | POA: Diagnosis present

## 2020-06-09 DIAGNOSIS — I2510 Atherosclerotic heart disease of native coronary artery without angina pectoris: Secondary | ICD-10-CM | POA: Diagnosis present

## 2020-06-09 DIAGNOSIS — R404 Transient alteration of awareness: Secondary | ICD-10-CM | POA: Diagnosis not present

## 2020-06-09 DIAGNOSIS — R0602 Shortness of breath: Secondary | ICD-10-CM | POA: Diagnosis not present

## 2020-06-09 DIAGNOSIS — R0603 Acute respiratory distress: Secondary | ICD-10-CM | POA: Diagnosis not present

## 2020-06-09 DIAGNOSIS — G4733 Obstructive sleep apnea (adult) (pediatric): Secondary | ICD-10-CM | POA: Diagnosis present

## 2020-06-09 DIAGNOSIS — R0600 Dyspnea, unspecified: Secondary | ICD-10-CM | POA: Diagnosis present

## 2020-06-09 DIAGNOSIS — I255 Ischemic cardiomyopathy: Secondary | ICD-10-CM | POA: Diagnosis present

## 2020-06-09 DIAGNOSIS — Z91199 Patient's noncompliance with other medical treatment and regimen due to unspecified reason: Secondary | ICD-10-CM

## 2020-06-09 LAB — COMPREHENSIVE METABOLIC PANEL
ALT: 36 U/L (ref 0–44)
AST: 32 U/L (ref 15–41)
Albumin: 3.8 g/dL (ref 3.5–5.0)
Alkaline Phosphatase: 103 U/L (ref 38–126)
Anion gap: 16 — ABNORMAL HIGH (ref 5–15)
BUN: 32 mg/dL — ABNORMAL HIGH (ref 6–20)
CO2: 24 mmol/L (ref 22–32)
Calcium: 9.2 mg/dL (ref 8.9–10.3)
Chloride: 99 mmol/L (ref 98–111)
Creatinine, Ser: 1.71 mg/dL — ABNORMAL HIGH (ref 0.61–1.24)
GFR calc Af Amer: 50 mL/min — ABNORMAL LOW (ref >60)
GFR calc non Af Amer: 43 mL/min — ABNORMAL LOW (ref >60)
Glucose, Bld: 421 mg/dL — ABNORMAL HIGH (ref 70–99)
Potassium: 3.5 mmol/L (ref 3.5–5.1)
Sodium: 139 mmol/L (ref 135–145)
Total Bilirubin: 0.7 mg/dL (ref 0.3–1.2)
Total Protein: 7.8 g/dL (ref 6.5–8.1)

## 2020-06-09 LAB — BLOOD GAS, VENOUS
Acid-Base Excess: 4.2 mmol/L — ABNORMAL HIGH (ref 0.0–2.0)
Acid-base deficit: 3 mmol/L — ABNORMAL HIGH (ref 0.0–2.0)
Bicarbonate: 18.7 mmol/L — ABNORMAL LOW (ref 20.0–28.0)
Bicarbonate: 24.2 mmol/L (ref 20.0–28.0)
FIO2: 60
FIO2: 50
O2 Saturation: 84.7 %
O2 Saturation: 54.1 %
Patient temperature: 36.4
Patient temperature: 36.4
pCO2, Ven: 81.4 mmHg (ref 44.0–60.0)
pCO2, Ven: 78.1 mmHg (ref 44.0–60.0)
pH, Ven: 7.12 — CL (ref 7.250–7.430)
pH, Ven: 7.227 — ABNORMAL LOW (ref 7.250–7.430)
pO2, Ven: 74.5 mmHg — ABNORMAL HIGH (ref 32.0–45.0)
pO2, Ven: 39.9 mmHg (ref 32.0–45.0)

## 2020-06-09 LAB — CBC WITH DIFFERENTIAL/PLATELET
Abs Immature Granulocytes: 0.13 10*3/uL — ABNORMAL HIGH (ref 0.00–0.07)
Basophils Absolute: 0.1 10*3/uL (ref 0.0–0.1)
Basophils Relative: 1 %
Eosinophils Absolute: 1.3 10*3/uL — ABNORMAL HIGH (ref 0.0–0.5)
Eosinophils Relative: 10 %
HCT: 50.2 % (ref 39.0–52.0)
Hemoglobin: 15.1 g/dL (ref 13.0–17.0)
Immature Granulocytes: 1 %
Lymphocytes Relative: 37 %
Lymphs Abs: 5.1 10*3/uL — ABNORMAL HIGH (ref 0.7–4.0)
MCH: 27.4 pg (ref 26.0–34.0)
MCHC: 30.1 g/dL (ref 30.0–36.0)
MCV: 90.9 fL (ref 80.0–100.0)
Monocytes Absolute: 1.2 10*3/uL — ABNORMAL HIGH (ref 0.1–1.0)
Monocytes Relative: 8 %
Neutro Abs: 6 10*3/uL (ref 1.7–7.7)
Neutrophils Relative %: 43 %
Platelets: 273 10*3/uL (ref 150–400)
RBC: 5.52 MIL/uL (ref 4.22–5.81)
RDW: 13.7 % (ref 11.5–15.5)
WBC: 13.9 10*3/uL — ABNORMAL HIGH (ref 4.0–10.5)
nRBC: 0 % (ref 0.0–0.2)

## 2020-06-09 LAB — TSH: TSH: 4.728 u[IU]/mL — ABNORMAL HIGH (ref 0.350–4.500)

## 2020-06-09 LAB — GLUCOSE, CAPILLARY: Glucose-Capillary: 212 mg/dL — ABNORMAL HIGH (ref 70–99)

## 2020-06-09 LAB — LIPID PANEL
Cholesterol: 242 mg/dL — ABNORMAL HIGH (ref 0–200)
HDL: 51 mg/dL (ref >40)
LDL Cholesterol: 149 mg/dL — ABNORMAL HIGH (ref 0–99)
Total CHOL/HDL Ratio: 4.7 RATIO
Triglycerides: 208 mg/dL — ABNORMAL HIGH (ref <150)
VLDL: 42 mg/dL — ABNORMAL HIGH (ref 0–40)

## 2020-06-09 LAB — SARS CORONAVIRUS 2 BY RT PCR (HOSPITAL ORDER, PERFORMED IN ~~LOC~~ HOSPITAL LAB): SARS Coronavirus 2: NEGATIVE

## 2020-06-09 LAB — TROPONIN I (HIGH SENSITIVITY): Troponin I (High Sensitivity): 28 ng/L — ABNORMAL HIGH (ref <18)

## 2020-06-09 LAB — VITAMIN D 25 HYDROXY (VIT D DEFICIENCY, FRACTURES): Vit D, 25-Hydroxy: 14.69 ng/mL — ABNORMAL LOW (ref 30–100)

## 2020-06-09 LAB — MAGNESIUM: Magnesium: 2.4 mg/dL (ref 1.7–2.4)

## 2020-06-09 LAB — CBG MONITORING, ED: Glucose-Capillary: 171 mg/dL — ABNORMAL HIGH (ref 70–99)

## 2020-06-09 LAB — BRAIN NATRIURETIC PEPTIDE: B Natriuretic Peptide: 519 pg/mL — ABNORMAL HIGH (ref 0.0–100.0)

## 2020-06-09 MED ORDER — FUROSEMIDE 10 MG/ML IJ SOLN
80.0000 mg | Freq: Once | INTRAMUSCULAR | Status: AC
  Administered 2020-06-09: 80 mg via INTRAVENOUS
  Filled 2020-06-09: qty 8

## 2020-06-09 MED ORDER — METOPROLOL SUCCINATE ER 50 MG PO TB24
50.0000 mg | ORAL_TABLET | Freq: Every day | ORAL | Status: DC
  Administered 2020-06-09 – 2020-06-10 (×2): 50 mg via ORAL
  Filled 2020-06-09: qty 1
  Filled 2020-06-09: qty 2

## 2020-06-09 MED ORDER — ASPIRIN EC 81 MG PO TBEC
81.0000 mg | DELAYED_RELEASE_TABLET | Freq: Every day | ORAL | Status: DC
  Administered 2020-06-10: 81 mg via ORAL
  Filled 2020-06-09: qty 1

## 2020-06-09 MED ORDER — PROCHLORPERAZINE EDISYLATE 10 MG/2ML IJ SOLN: 10.0000 mg | Freq: Four times a day (QID) | INTRAMUSCULAR | Status: DC | PRN

## 2020-06-09 MED ORDER — INSULIN GLARGINE 100 UNIT/ML ~~LOC~~ SOLN
40.0000 [IU] | Freq: Every day | SUBCUTANEOUS | Status: DC
  Administered 2020-06-09: 40 [IU] via SUBCUTANEOUS
  Filled 2020-06-09 (×2): qty 0.4

## 2020-06-09 MED ORDER — SODIUM CHLORIDE 0.9% FLUSH
3.0000 mL | INTRAVENOUS | Status: DC | PRN
  Administered 2020-06-09: 3 mL via INTRAVENOUS

## 2020-06-09 MED ORDER — ROSUVASTATIN CALCIUM 20 MG PO TABS
40.0000 mg | ORAL_TABLET | Freq: Every evening | ORAL | Status: DC
  Administered 2020-06-09: 40 mg via ORAL
  Filled 2020-06-09: qty 2
  Filled 2020-06-09: qty 1

## 2020-06-09 MED ORDER — NITROGLYCERIN IN D5W 200-5 MCG/ML-% IV SOLN
5.0000 ug/min | INTRAVENOUS | Status: DC
  Administered 2020-06-09: 20 ug/min via INTRAVENOUS
  Filled 2020-06-09: qty 250

## 2020-06-09 MED ORDER — IPRATROPIUM-ALBUTEROL 0.5-2.5 (3) MG/3ML IN SOLN
3.0000 mL | Freq: Three times a day (TID) | RESPIRATORY_TRACT | Status: DC
  Administered 2020-06-09 – 2020-06-10 (×3): 3 mL via RESPIRATORY_TRACT
  Filled 2020-06-09 (×3): qty 3

## 2020-06-09 MED ORDER — INSULIN ASPART 100 UNIT/ML ~~LOC~~ SOLN
6.0000 [IU] | Freq: Three times a day (TID) | SUBCUTANEOUS | Status: DC
  Administered 2020-06-09 – 2020-06-10 (×2): 6 [IU] via SUBCUTANEOUS

## 2020-06-09 MED ORDER — SODIUM CHLORIDE 0.9 % IV SOLN: 250.0000 mL | INTRAVENOUS | Status: DC | PRN

## 2020-06-09 MED ORDER — FUROSEMIDE 10 MG/ML IJ SOLN
60.0000 mg | Freq: Two times a day (BID) | INTRAMUSCULAR | Status: DC
  Administered 2020-06-09 – 2020-06-10 (×2): 60 mg via INTRAVENOUS
  Filled 2020-06-09 (×2): qty 6

## 2020-06-09 MED ORDER — ACETAMINOPHEN 325 MG PO TABS: 650.0000 mg | ORAL_TABLET | ORAL | Status: DC | PRN

## 2020-06-09 MED ORDER — ALBUTEROL (5 MG/ML) CONTINUOUS INHALATION SOLN
10.0000 mg/h | INHALATION_SOLUTION | Freq: Once | RESPIRATORY_TRACT | Status: AC
  Administered 2020-06-09: 10 mg/h via RESPIRATORY_TRACT
  Filled 2020-06-09: qty 20

## 2020-06-09 MED ORDER — SODIUM CHLORIDE 0.9% FLUSH
3.0000 mL | Freq: Two times a day (BID) | INTRAVENOUS | Status: DC
  Administered 2020-06-09 – 2020-06-10 (×2): 3 mL via INTRAVENOUS

## 2020-06-09 MED ORDER — ALLOPURINOL 300 MG PO TABS
300.0000 mg | ORAL_TABLET | Freq: Every day | ORAL | Status: DC
  Administered 2020-06-09 – 2020-06-10 (×2): 300 mg via ORAL
  Filled 2020-06-09 (×3): qty 1

## 2020-06-09 MED ORDER — HEPARIN SODIUM (PORCINE) 5000 UNIT/ML IJ SOLN
5000.0000 [IU] | Freq: Three times a day (TID) | INTRAMUSCULAR | Status: DC
  Administered 2020-06-09 – 2020-06-10 (×3): 5000 [IU] via SUBCUTANEOUS
  Filled 2020-06-09 (×3): qty 1

## 2020-06-09 MED ORDER — ADULT MULTIVITAMIN W/MINERALS CH
1.0000 | ORAL_TABLET | Freq: Every day | ORAL | Status: DC
  Administered 2020-06-09 – 2020-06-10 (×2): 1 via ORAL
  Filled 2020-06-09 (×2): qty 1

## 2020-06-09 MED ORDER — INSULIN ASPART 100 UNIT/ML ~~LOC~~ SOLN
0.0000 [IU] | Freq: Three times a day (TID) | SUBCUTANEOUS | Status: DC
  Administered 2020-06-09: 2 [IU] via SUBCUTANEOUS
  Administered 2020-06-10: 3 [IU] via SUBCUTANEOUS
  Filled 2020-06-09: qty 1

## 2020-06-09 MED ORDER — POTASSIUM CHLORIDE CRYS ER 20 MEQ PO TBCR
20.0000 meq | EXTENDED_RELEASE_TABLET | Freq: Two times a day (BID) | ORAL | Status: DC
  Administered 2020-06-09 – 2020-06-10 (×3): 20 meq via ORAL
  Filled 2020-06-09 (×3): qty 1

## 2020-06-09 MED ORDER — LORATADINE 10 MG PO TABS: 10.0000 mg | ORAL_TABLET | Freq: Every day | ORAL | Status: DC | PRN

## 2020-06-09 NOTE — ED Notes (Signed)
Date and time results received: 06/09/20 0622   Test: pH Critical Value: 7.120  Name of Provider Notified: Rolland Porter MD

## 2020-06-09 NOTE — H&P (Signed)
History and Physical  [facility]  Antonio Cobb LFY:[id number] DOB: 1961-06-21 DOA: 06/09/2020  PCP: [name], MD  Patient coming from: Home   I have personally briefly reviewed patient's old medical records in [place]  Chief Complaint: SOB   HPI: Antonio Cobb is a 59 y.o. male with medical history significant for ischemic cardiomyopathy with an EF 20% on last echo done in May 2021 implanted ICD followed by Dr. Lovena Le, hyperlipidemia, COPD, OSA, poorly controlled type 2 diabetes mellitus and hypertension who was recently discharged from [facility] in May 2021 after being admitted for acute respiratory failure secondary to systolic CHF exacerbation.  His EF was noted at that time to be down from 25% to 20%.  He has an appointment to see Dr. Haroldine Laws in August 2021 at the advanced heart failure clinic.  Unfortunately he developed a case of acute gout likely precipitated by diuretics and saw his PCP on 05/28/2020 and was treated with a short burst of prednisone and later started on allopurinol.  He tends to develop flash pulmonary edema especially during the evening hours.  His dry weights are usually 199-205 pounds. He reportedly developed severe shortness of breath and respiratory distress and arrived by EMS to the emergency department on CPAP.  He recently started home CPAP after equipment was delivered after hospitalization last month.  His pulse ox was 92% on CPAP.  EMS reported that he had a systolic blood pressure of 220 and he was given 2 nitroglycerin to help with blood pressure.  He was not having chest pain symptoms.  He was placed on BiPAP after arrival to the emergency department.  His ABG showed findings of severe acute respiratory acidosis with high PCO2 levels.   ED Course: He arrived on CPAP, placed on BiPAP.  Venous blood gas pH 7.120 PCO2 81.4 PO2 74.5.  Sodium 139 potassium 3.5 glucose 421 BUN 32 creatinine 1.71.  Cardiac BNP 519.  Troponin  28.  WBC 13.9.  Chest x-ray with pulmonary vascular congestion.  EKG with sinus tachycardia.  He was placed on IV nitroglycerin infusion.  Fortunately patient started to improve on BiPAP therapy which he has tolerated well in the past.  Patient was given 80 mg of Lasix IV maintained on BiPAP and admission was requested.  Review of Systems: As per HPI otherwise 10 point review of systems negative.    Past Medical History:  Diagnosis Date  . Acute CHF (congestive heart failure) ([place]) 01/21/2020  . Acute on chronic combined systolic and diastolic CHF (congestive heart failure) ([place])   . Acute respiratory failure with hypoxia and hypercapnia (HCC)   . Acute systolic CHF (congestive heart failure) ([place]) 05/08/2020  . Alcohol abuse   . CAD (coronary artery disease)    a. s/p inferolateral STEMI in 05/2012 with DES to LCx b. patent stent by cath in 05/2016 with moderate RCA and LAD disease  . Cardiomyopathy ([place])    a. LVEF 30-35% by echo in 09/2016. b. 08/2017: echo showing EF remains reduced at 25-30% c. s/p [employer] ICD placement in 01/2018  . CHF (congestive heart failure) ([place])   . CHF exacerbation ([place]) 11/18/2019  . COPD (chronic obstructive pulmonary disease) ([place])   . Diabetes mellitus, type 2 ([place])   . Essential hypertension   . Hypertensive urgency   . Mitral regurgitation    Moderate  . Mixed hyperlipidemia   . Myocardial infarction ([place]) 05/23/2012   Inferolateral STEMI  . Obesity  Past Surgical History:  Procedure Laterality Date  . CARDIAC CATHETERIZATION  2 yrs ago  . CARDIAC CATHETERIZATION N/A 05/15/2016   Procedure: Right/Left Heart Cath and Coronary Angiography;  Surgeon: [name], MD;  Location: [place] [facility];  Service: Cardiovascular;  Laterality: N/A;  . CORONARY STENT PLACEMENT  05/23/12  . ICD IMPLANT N/A 02/01/2018   Procedure: ICD IMPLANT;  Surgeon: Evans Lance, MD;  Location: [place] CV LAB;  Service: Cardiovascular;  Laterality:  N/A;  . LEFT HEART CATHETERIZATION WITH CORONARY ANGIOGRAM N/A 05/23/2012   Procedure: LEFT HEART CATHETERIZATION WITH CORONARY ANGIOGRAM;  Surgeon: [name], MD;  Location[facility] C[facility] LAB;  Service: Cardiovascular;  Laterality: N/A;  . None    . PERCUTANEOUS CORONARY STENT INTERVENTION (PCI-S) N/A 05/23/2012   Procedure: PERCUTANEOUS CORONARY STENT INTERVENTION (PCI-S);  Surgeon: [name], MD;  Location: [facility] CATH LAB;  Service: Cardiovascular;  Laterality: N/A;  . POLYPECTOMY  10/16/2011   Procedure: POLYPECTOMY;  Surgeon: [name], MD;  Location: [facility];  Service: Endoscopy;;  Polypoid Lesion, Transverse and Sigmoid Colon     reports that he quit smoking about 4 years ago. His smoking use included cigars. He has a 7.50 pack-year smoking history. He has never used smokeless tobacco. He reports current alcohol use. He reports that he does not use drugs.  Allergies  Allergen Reactions  . Entresto [Sacubitril-Valsartan]     Dizziness, Coughing  . Other     Seasonal allergies  - has to use inhaler    Family History  Problem Relation Age of Onset  . Emphysema Mother   . Heart attack Mother   . Hypertension Mother   . Diabetes Mother   . Heart disease Mother   . COPD Mother   . Emphysema Father   . COPD Father   . Stroke Sister   . Asthma Sister   . Colon cancer Neg Hx   . Anesthesia problems Neg Hx   . Hypotension Neg Hx   . Malignant hyperthermia Neg Hx   . Pseudochol deficiency Neg Hx     Prior to Admission medications   Medication Sig Start Date End Date Taking? Authorizing Provider  allopurinol (ZYLOPRIM) 300 MG tablet Take 1 tablet (300 mg total) by mouth daily. 06/07/20   [name], MD  aspirin EC 81 MG EC tablet Take 1 tablet (81 mg total) by mouth daily with breakfast. 11/20/19   Roxan Hockey, MD  docusate sodium (COLACE) 100 MG capsule Take 100 mg by mouth daily.    [provider]  famotidine (PEPCID) 20 MG tablet Take 1 tablet (20 mg  total) by mouth at bedtime. 01/25/20   Orson Eva, MD  glucose blood (FREESTYLE LITE) test strip USE TO TEST FOUR TIMES DAILY 06/03/20   [name], MD  hydrOXYzine (ATARAX/VISTARIL) 25 MG tablet Take one tablet by mouth at bedtime, for anxiety and sleep 04/09/20   [name], MD  insulin aspart (NOVOLOG) 100 UNIT/ML FlexPen Inject 10-16 Units into the skin 3 (three) times daily with meals. 05/28/20   Cassandria Anger, MD  insulin glargine (LANTUS SOLOSTAR) 100 UNIT/ML Solostar Pen Inject 50 Units into the skin at bedtime. 05/28/20   Cassandria Anger, MD  loratadine (CLARITIN) 10 MG tablet Take 1 tablet (10 mg total) by mouth daily. 03/27/20   Perlie Mayo, NP  losartan (COZAAR) 25 MG tablet Take 1 tablet (25 mg total) by mouth daily. 03/27/20   Cherly [place]  M, NP  Melatonin 10 MG TABS Take 2 tablets by mouth at bedtime.    [provider]  metoprolol succinate (TOPROL-XL) 50 MG 24 hr tablet TAKE 1 TABLET BY MOUTH EVERY DAY WITH FOOD Patient taking differently: Take 50 mg by mouth daily. TAKE 1 TABLET BY MOUTH EVERY DAY WITH FOOD 11/20/19   Roxan Hockey, MD  Multiple Vitamins-Minerals (MULTIVITAMIN WITH MINERALS) tablet Take 1 tablet by mouth daily. 11/20/19 11/19/20  Roxan Hockey, MD  pantoprazole (PROTONIX) 40 MG tablet Take 1 tablet (40 mg total) by mouth daily. Take 30-48min before first meal of the day. 04/01/20   Lauraine Rinne, NP  potassium chloride SA (KLOR-CON) 20 MEQ tablet Take 1 tablet (20 mEq total) by mouth daily. 11/20/19   Roxan Hockey, MD  predniSONE (DELTASONE) 20 MG tablet Take one tablet by mouth three times daily for 2 days, then take one tablet two times daily for 2 days , then take one tablet once daily for 2 days, then stop 05/28/20   [name], MD  rosuvastatin (CRESTOR) 40 MG tablet Take 1 tablet (40 mg total) by mouth daily. 11/20/19   Roxan Hockey, MD  sertraline (ZOLOFT) 25 MG tablet Take 1 tablet (25 mg total) by mouth  daily. For anxiety/panic attacks 04/09/20 04/09/21  [name], MD  spironolactone (ALDACTONE) 25 MG tablet Take 0.5 tablets (12.5 mg total) by mouth daily. 02/27/20   Arnoldo Lenis, MD  torsemide (DEMADEX) 20 MG tablet Take 3 tablets (60 mg total) by mouth daily. 05/10/20   Murlean Iba, MD    Physical Exam: Vitals:   06/09/20 0700 06/09/20 0705 06/09/20 0710 06/09/20 0715  BP: 124/84 126/83 (!) 146/88 (!) 148/91  Pulse: 90 93 96 97  Resp: (!) 22 (!) 23 15 20   Temp:      TempSrc:      SpO2: 99% 99% 100% 98%  Weight:      Height:       Constitutional: Pt appears ill on bipap, awake but somnolent, NAD, calm, cooperative.  Eyes: PERRL, lids and conjunctivae normal.  ENMT: No gross lesions seen. Pt on bipap.  Neck: normal, supple, no masses, no thyromegaly Respiratory:  Bibasilar crackles. tachypnea. Mild to moderate accessory muscle use.  Cardiovascular: tachycardic, 1+ extremity edema. 2+ pedal pulses. No carotid bruits.  Abdomen: obese, no tenderness, no masses palpated. No hepatosplenomegaly. Bowel sounds positive.  Musculoskeletal: no clubbing / cyanosis. No joint deformity upper and lower extremities. Good ROM, no contractures. Normal muscle tone.  Skin: no rashes, lesions, ulcers. No induration Neurologic: CN 2-12 grossly intact. Sensation intact, DTR normal. Strength 5/5 in all 4.  Psychiatric: Normal judgment and insight.  Normal mood.   Labs on Admission: I have personally reviewed following labs and imaging studies  CBC: Recent Labs  Lab 06/09/20 0601  WBC 13.9*  NEUTROABS 6.0  HGB 15.1  HCT 50.2  MCV 90.9  PLT 778   Basic Metabolic Panel: Recent Labs  Lab 06/09/20 0601  NA 139  K 3.5  CL 99  CO2 24  GLUCOSE 421*  BUN 32*  CREATININE 1.71*  CALCIUM 9.2   GFR: Estimated Creatinine Clearance: 53.3 mL/min (A) (by C-G formula based on SCr of 1.71 mg/dL (H)). Liver Function Tests: Recent Labs  Lab 06/09/20 0601  AST 32  ALT 36    ALKPHOS 103  BILITOT 0.7  PROT 7.8  ALBUMIN 3.8   No results for input(s): LIPASE, AMYLASE in the last 168 hours.  No results for input(s): AMMONIA in the last 168 hours. Coagulation Profile: No results for input(s): INR, PROTIME in the last 168 hours. Cardiac Enzymes: No results for input(s): CKTOTAL, CKMB, CKMBINDEX, TROPONINI in the last 168 hours. BNP (last 3 results) No results for input(s): PROBNP in the last 8760 hours. HbA1C: No results for input(s): HGBA1C in the last 72 hours. CBG: No results for input(s): GLUCAP in the last 168 hours. Lipid Profile: No results for input(s): CHOL, HDL, LDLCALC, TRIG, CHOLHDL, LDLDIRECT in the last 72 hours. Thyroid Function Tests: No results for input(s): TSH, T4TOTAL, FREET4, T3FREE, THYROIDAB in the last 72 hours. Anemia Panel: No results for input(s): VITAMINB12, FOLATE, FERRITIN, TIBC, IRON, RETICCTPCT in the last 72 hours. Urine analysis:    Component Value Date/Time   COLORURINE COLORLESS (A) 09/06/2017 0452   APPEARANCEUR CLEAR 09/06/2017 0452   LABSPEC 1.006 09/06/2017 0452   PHURINE 5.0 09/06/2017 0452   GLUCOSEU >=500 (A) 09/06/2017 0452   GLUCOSEU NEG mg/dL 03/04/2007 0829   HGBUR NEGATIVE 09/06/2017 0452   HGBUR negative 11/13/2010 [place] 09/06/2017 0452   KETONESUR NEGATIVE 09/06/2017 0452   PROTEINUR NEGATIVE 09/06/2017 0452   UROBILINOGEN 0.2 03/23/2015 1805   NITRITE NEGATIVE 09/06/2017 0452   LEUKOCYTESUR NEGATIVE 09/06/2017 0452   Radiological Exams on Admission: DG Chest Port 1 View  Result Date: 06/09/2020 CLINICAL DATA:  Respiratory distress EXAM: PORTABLE CHEST 1 VIEW COMPARISON:  05/10/2020 FINDINGS: Unchanged pulmonary vascular congestion. Cardiomediastinal contours are normal. Unchanged position of left chest wall AICD lead. IMPRESSION: Unchanged pulmonary vascular congestion. Electronically Signed   By: Ulyses Jarred M.D.   On: 06/09/2020 06:48   EKG: Independently reviewed. Sinus  tachycardia.   Assessment/Plan Principal Problem:   Acute systolic CHF (congestive heart failure) (HCC) Active Problems:   Insulin-requiring or dependent type II diabetes mellitus (HCC)   Mixed hyperlipidemia   Overweight with body mass index (BMI) of 29 to 29.9 in adult   Essential hypertension   Abnormal echocardiogram   DM type 2 causing vascular disease (HCC)   Personal history of noncompliance with medical treatment, presenting hazards to health   Chronic systolic (congestive) heart failure (HCC)   Pulmonary edema   Flash pulmonary edema (HCC)   CAD (coronary artery disease)- s/p STEMI with DES to Lt Cir in 2013/S/p AICD 01/2018   Ischemic cardiomyopathy with implantable cardioverter-defibrillator (ICD)/s/p STEMI with DES to Lt Cir in 2013/S/p AICD 01/2018   Dyspnea   OSA (obstructive sleep apnea)     1. Acute respiratory failure with hypercarbia-likely multifactorial given his significant systolic heart failure and superimposed COPD.  He is being treated with BiPAP therapy seems to be showing some improvement with this therapy.  We are diuresing him with IV Lasix and he is receiving scheduled DuoNeb treatments.  Repeat ABG shows slow improvement. 2. Acute respiratory acidosis patient presented with severe hypercarbia being treated with BiPAP therapy repeat ABGs show some improvement. 3. Acute systolic CHF exacerbation - baseline weight 199-205#.  He is at 205 pounds today.  Continue IV lasix for now.  4. Flash pulmonary edema-IV Lasix ordered.  On BiPAP therapy. 5. Ischemic cardiomyopathy with ICD-EF 20% checked last month, continue diuresis as ordered above.  Patient has appointment with advanced heart failure clinic in August 2021.  Patient unfortunately was not able to tolerate Entresto in the past due to dizziness and had been on losartan. 6. Poorly controlled type 2 diabetes mellitus with vascular complications-as evidenced by hemoglobin A1c greater than  10% SSI coverage ordered  and CBG monitoring ordered titrate as needed. 7. Acute kidney injury on stage 3a CKD-temporarily holding losartan hopefully can restart in the next 1 to 2 days. 8. Hypertensive urgency-likely was secondary to the acute respiratory failure his blood pressures have responded well to the IV nitroglycerin infusion which is currently be discontinued.  Resume home metoprolol.  Temporarily holding losartan with AKI. 9. OSA-currently on BiPAP however would resume CPAP nightly if off BiPAP.  He recently started on nightly CPAP as he was able to get equipment delivered after hospitalization last month.   10. Hyperlipidemia - resume crestor 40 mg daily.    DVT prophylaxis: subcut heparin  Code Status: Full   Family Communication: wife   Disposition Plan: home   Consults called:   Admission status: INP   Critical Care Procedure Note Authorized and Performed by: Murvin Natal MD  Total Critical Care time:  55 minutes  Due to a high probability of clinically significant, life threatening deterioration, the patient required my highest level of preparedness to intervene emergently and I personally spent this critical care time directly and personally managing the patient.  This critical care time included obtaining a history; examining the patient, pulse oximetry; ordering and review of studies; arranging urgent treatment with development of a management plan; evaluation of patient's response of treatment; frequent reassessment; and discussions with other providers.  This critical care time was performed to assess and manage the high probability of imminent and life threatening deterioration that could result in multi-organ failure.  It was exclusive of separately billable procedures and treating other patients and teaching time.    Irwin Brakeman MD [facility] How to contact the [facility] Attending or Consulting provider [place] or covering provider during after hours [place], for this patient?  1. Check the  care team in [facility] and look for a) attending/consulting TRH provider listed and b) the [facility] team listed 2. Log into [email or web address] and use [place]'s universal password to access. If you do not have the password, please contact the hospital operator. 3. Locate the [facility] provider you are looking for under [facility] and page to a number that you can be directly reached. 4. If you still have difficulty reaching the provider, please page the [facility] (Director on Call) for the Hospitalists listed on amion for assistance.   If 7PM-7AM, please contact night-coverage [email or web address] Password [facility]  06/09/2020, 7:40 AM

## 2020-06-09 NOTE — ED Notes (Signed)
Date and time results received: 06/09/20 0622   Test: pCO2 Critical Value: 81.4  Name of Provider Notified: Carmelia Bake MD

## 2020-06-09 NOTE — TOC Initial Note (Signed)
Transition of Care [facility]) - Initial/Assessment Note    Patient Details  Name: Antonio Cobb MRN: [id number] Date of Birth: 1961-08-10  Transition of Care [facility]) CM/SW Contact:    Natasha Bence, LCSW Phone Number: 06/09/2020, 4:23 PM  Clinical Narrative:                 Patient is a 59 year old male admitted for CHF. CSW conducted initial assesment with Pt's wife. Pt's wife reported that they live in a single family home and currently does not use DME equipment. Pt's wife also reported that Patient has not have a hx of HH or SNF. CSW received CHF consult. Pt's wife reported that work to adhere to a heart healthy diet by reducing salt intake. Pt's wife also reported that Pt monitors his weight weight and fluid intake on a daily basis. Patient's wife requested to speak to a dietitian to inquire about further dietary needs to manage CHF. CSW requested consult to attending physician. TOC to follow.   Expected Discharge Plan: Home/Self Care Barriers to Discharge: Continued Medical Work up   Patient Goals and CMS Choice        Expected Discharge Plan and Services Expected Discharge Plan: Home/Self Care                                              Prior Living Arrangements/Services   Lives with:: Spouse Patient language and need for interpreter reviewed:: Yes        Need for Family Participation in Patient Care: Yes (Comment) Care giver support system in place?: Yes (comment)   Criminal Activity/Legal Involvement Pertinent to Current Situation/Hospitalization: No - Comment as needed  Activities of Daily Living      Permission Sought/Granted                  Emotional Assessment       Orientation: : Oriented to Self, Oriented to Place, Oriented to  Time, Oriented to Situation Alcohol / Substance Use: Not Applicable Psych Involvement: No (comment)  Admission diagnosis:  Acute systolic CHF (congestive heart failure) (HCC) [I50.21] Acute on chronic  respiratory failure with hypoxia and hypercapnia (HCC) [X38.18, J96.22] Patient Active Problem List   Diagnosis Date Noted  . Acute systolic CHF (congestive heart failure) ([place]) 06/09/2020  . Acute respiratory failure with hypercapnia ([place]) 06/09/2020  . COPD (chronic obstructive pulmonary disease) ([place]) 06/09/2020  . Acute on chronic respiratory failure with hypoxia and hypercapnia ([place]) 06/09/2020  . Acute pain of left wrist 05/28/2020  . Gout attack 05/28/2020  . Environmental and seasonal allergies 03/27/2020  . OSA (obstructive sleep apnea)  01/24/2020  . Dyspnea 01/21/2020  . Encounter for support and coordination of transition of care 11/30/2019  . CAD (coronary artery disease)- s/p STEMI with DES to Lt Cir in 2013/S/p AICD 01/2018 11/19/2019  . Ischemic cardiomyopathy with implantable cardioverter-defibrillator (ICD)/s/p STEMI with DES to Lt Cir in 2013/S/p AICD 01/2018 11/19/2019  . Anxiety attack/panic attacks 11/19/2019  . Flash pulmonary edema ([place]) 11/13/2019  . Uncontrolled type 1 diabetes mellitus with hyperglycemia ([place]) 10/11/2019  . Pulmonary edema 04/05/2019  . Muscle pain 02/28/2018  . Chronic systolic (congestive) heart failure ([place]) 02/01/2018  . DM type 2 causing vascular disease ([place]) 10/18/2017  . Personal history of noncompliance with medical treatment, presenting hazards to health 10/18/2017  . Lumbar degenerative  disc disease 06/20/2017  . Upper airway cough syndrome 05/27/2017  . Chronic systolic heart failure ([place]) 11/01/2016  . AKI (acute kidney injury) ([place]) 10/07/2016  . Prolonged QT interval 10/07/2016  . Coronary artery disease involving coronary bypass graft of native heart with angina pectoris ([place])   . Abnormal echocardiogram   . Allergic rhinitis 05/06/2015  . Cough 05/10/2013  . Alcohol abuse 05/23/2012  . Arteriosclerotic cardiovascular disease (ASCVD) 05/23/2012  . ED (erectile dysfunction) 05/10/2012  . Overweight with body mass index (BMI)  of 29 to 29.9 in adult 08/12/2010  . Insulin-requiring or dependent type II diabetes mellitus ([place]) 03/08/2008  . Mixed hyperlipidemia 03/08/2008  . Essential hypertension 03/08/2008   PCP:  [name], MD Pharmacy:   [facility], [place] [facility] [place] [address] [place] [address] Phone: [phone] Fax: [phone]     Social Determinants of Health (SDOH) Interventions    Readmission Risk Interventions Readmission Risk Prevention Plan 11/13/2019 04/05/2019  Medication Screening Complete Complete  Transportation Screening Complete Complete  Some recent data might be hidden

## 2020-06-09 NOTE — ED Provider Notes (Signed)
[facility] EMERGENCY DEPARTMENT Provider Note   CSN: [id number] Arrival date & time: 06/09/20  0554   Time seen 2:52 AM on arrival  History Chief Complaint  Patient presents with  . Respiratory Distress   Level 5 caveat for respiratory distress  Antonio Cobb is a 59 y.o. male.  HPI   EMS states patient woke up with severe respiratory distress.  Patient has been seen in the ED several times before with similar presentation.  He is not really able to verbalize when he arrived.  He arrived with CPAP.  They report his pulse ox was 92 to 96% on the CPAP.  EMS gave him 2 nitroglycerin because his blood pressure was 725 systolic.  PCP [name], MD   Past Medical History:  Diagnosis Date  . Acute CHF (congestive heart failure) ([place]) 01/21/2020  . Acute on chronic combined systolic and diastolic CHF (congestive heart failure) ([place])   . Acute respiratory failure with hypoxia and hypercapnia (HCC)   . Acute systolic CHF (congestive heart failure) ([place]) 05/08/2020  . Alcohol abuse   . CAD (coronary artery disease)    a. s/p inferolateral STEMI in 05/2012 with DES to LCx b. patent stent by cath in 05/2016 with moderate RCA and LAD disease  . Cardiomyopathy ([place])    a. LVEF 30-35% by echo in 09/2016. b. 08/2017: echo showing EF remains reduced at 25-30% c. s/p [employer] ICD placement in 01/2018  . CHF (congestive heart failure) ([place])   . CHF exacerbation ([place]) 11/18/2019  . COPD (chronic obstructive pulmonary disease) ([place])   . Diabetes mellitus, type 2 ([place])   . Essential hypertension   . Hypertensive urgency   . Mitral regurgitation    Moderate  . Mixed hyperlipidemia   . Myocardial infarction ([place]) 05/23/2012   Inferolateral STEMI  . Obesity     Patient Active Problem List   Diagnosis Date Noted  . Acute systolic CHF (congestive heart failure) ([place]) 06/09/2020  . Acute pain of left wrist 05/28/2020  . Gout attack 05/28/2020  . Environmental and  seasonal allergies 03/27/2020  . OSA (obstructive sleep apnea)  01/24/2020  . Dyspnea 01/21/2020  . Encounter for support and coordination of transition of care 11/30/2019  . CAD (coronary artery disease)- s/p STEMI with DES to Lt Cir in 2013/S/p AICD 01/2018 11/19/2019  . Ischemic cardiomyopathy with implantable cardioverter-defibrillator (ICD)/s/p STEMI with DES to Lt Cir in 2013/S/p AICD 01/2018 11/19/2019  . Anxiety attack/panic attacks 11/19/2019  . Flash pulmonary edema ([place]) 11/13/2019  . Uncontrolled type 1 diabetes mellitus with hyperglycemia ([place]) 10/11/2019  . Hospital discharge follow-up 04/15/2019  . Pulmonary edema 04/05/2019  . Muscle pain 02/28/2018  . Chronic systolic (congestive) heart failure ([place]) 02/01/2018  . DM type 2 causing vascular disease ([place]) 10/18/2017  . Personal history of noncompliance with medical treatment, presenting hazards to health 10/18/2017  . Lumbar degenerative disc disease 06/20/2017  . Upper airway cough syndrome 05/27/2017  . Chronic systolic heart failure ([place]) 11/01/2016  . AKI (acute kidney injury) ([place]) 10/07/2016  . Prolonged QT interval 10/07/2016  . Coronary artery disease involving coronary bypass graft of native heart with angina pectoris ([place])   . Abnormal echocardiogram   . Allergic rhinitis 05/06/2015  . Cough 05/10/2013  . Alcohol abuse 05/23/2012  . Arteriosclerotic cardiovascular disease (ASCVD) 05/23/2012  . ED (erectile dysfunction) 05/10/2012  . Overweight with body mass index (BMI) of 29 to 29.9 in adult 08/12/2010  . Insulin-requiring or dependent  type II diabetes mellitus ([place]) 03/08/2008  . Mixed hyperlipidemia 03/08/2008  . Essential hypertension 03/08/2008    Past Surgical History:  Procedure Laterality Date  . CARDIAC CATHETERIZATION  2 yrs ago  . CARDIAC CATHETERIZATION N/A 05/15/2016   Procedure: Right/Left Heart Cath and Coronary Angiography;  Surgeon: [name], MD;  Location: [facility];   Service: Cardiovascular;  Laterality: N/A;  . CORONARY STENT PLACEMENT  05/23/12  . ICD IMPLANT N/A 02/01/2018   Procedure: ICD IMPLANT;  Surgeon: Evans Lance, MD;  Location: [facility];  Service: Cardiovascular;  Laterality: N/A;  . LEFT HEART CATHETERIZATION WITH CORONARY ANGIOGRAM N/A 05/23/2012   Procedure: LEFT HEART CATHETERIZATION WITH CORONARY ANGIOGRAM;  Surgeon: [name], MD;  Location: [facility] CATH LAB;  Service: Cardiovascular;  Laterality: N/A;  . None    . PERCUTANEOUS CORONARY STENT INTERVENTION (PCI-S) N/A 05/23/2012   Procedure: PERCUTANEOUS CORONARY STENT INTERVENTION (PCI-S);  Surgeon: [name], MD;  Location: [facility] CATH LAB;  Service: Cardiovascular;  Laterality: N/A;  . POLYPECTOMY  10/16/2011   Procedure: POLYPECTOMY;  Surgeon: [name], MD;  Location: [facility];  Service: Endoscopy;;  Polypoid Lesion, Transverse and Sigmoid Colon       Family History  Problem Relation Age of Onset  . Emphysema Mother   . Heart attack Mother   . Hypertension Mother   . Diabetes Mother   . Heart disease Mother   . COPD Mother   . Emphysema Father   . COPD Father   . Stroke Sister   . Asthma Sister   . Colon cancer Neg Hx   . Anesthesia problems Neg Hx   . Hypotension Neg Hx   . Malignant hyperthermia Neg Hx   . Pseudochol deficiency Neg Hx     Social History   Tobacco Use  . Smoking status: Former Smoker    Packs/day: 0.25    Years: 30.00    Pack years: 7.50    Types: Cigars    Quit date: 05/23/2016    Years since quitting: 4.0  . Smokeless tobacco: Never Used  . Tobacco comment: since early 74s  Vaping Use  . Vaping Use: Never used  Substance Use Topics  . Alcohol use: Yes    Alcohol/week: 0.0 standard drinks    Comment: occ  . Drug use: No    Home Medications Prior to Admission medications   Medication Sig Start Date End Date Taking? Authorizing Provider  allopurinol (ZYLOPRIM) 300 MG tablet Take 1 tablet (300 mg total) by mouth daily.  06/07/20   [name], MD  aspirin EC 81 MG EC tablet Take 1 tablet (81 mg total) by mouth daily with breakfast. 11/20/19   Roxan Hockey, MD  docusate sodium (COLACE) 100 MG capsule Take 100 mg by mouth daily.    [provider]  famotidine (PEPCID) 20 MG tablet Take 1 tablet (20 mg total) by mouth at bedtime. 01/25/20   Orson Eva, MD  glucose blood (FREESTYLE LITE) test strip USE TO TEST FOUR TIMES DAILY 06/03/20   [name], MD  hydrOXYzine (ATARAX/VISTARIL) 25 MG tablet Take one tablet by mouth at bedtime, for anxiety and sleep 04/09/20   [name], MD  insulin aspart (NOVOLOG) 100 UNIT/ML FlexPen Inject 10-16 Units into the skin 3 (three) times daily with meals. 05/28/20   Cassandria Anger, MD  insulin glargine (LANTUS SOLOSTAR) 100 UNIT/ML Solostar Pen Inject 50 Units into the skin at bedtime. 05/28/20  Cassandria Anger, MD  loratadine (CLARITIN) 10 MG tablet Take 1 tablet (10 mg total) by mouth daily. 03/27/20   Perlie Mayo, NP  losartan (COZAAR) 25 MG tablet Take 1 tablet (25 mg total) by mouth daily. 03/27/20   Perlie Mayo, NP  Melatonin 10 MG TABS Take 2 tablets by mouth at bedtime.    [provider]  metoprolol succinate (TOPROL-XL) 50 MG 24 hr tablet TAKE 1 TABLET BY MOUTH EVERY DAY WITH FOOD Patient taking differently: Take 50 mg by mouth daily. TAKE 1 TABLET BY MOUTH EVERY DAY WITH FOOD 11/20/19   Roxan Hockey, MD  Multiple Vitamins-Minerals (MULTIVITAMIN WITH MINERALS) tablet Take 1 tablet by mouth daily. 11/20/19 11/19/20  Roxan Hockey, MD  pantoprazole (PROTONIX) 40 MG tablet Take 1 tablet (40 mg total) by mouth daily. Take 30-59min before first meal of the day. 04/01/20   Lauraine Rinne, NP  potassium chloride SA (KLOR-CON) 20 MEQ tablet Take 1 tablet (20 mEq total) by mouth daily. 11/20/19   Roxan Hockey, MD  predniSONE (DELTASONE) 20 MG tablet Take one tablet by mouth three times daily for 2 days, then take one  tablet two times daily for 2 days , then take one tablet once daily for 2 days, then stop 05/28/20   [name], MD  rosuvastatin (CRESTOR) 40 MG tablet Take 1 tablet (40 mg total) by mouth daily. 11/20/19   Roxan Hockey, MD  sertraline (ZOLOFT) 25 MG tablet Take 1 tablet (25 mg total) by mouth daily. For anxiety/panic attacks 04/09/20 04/09/21  [name], MD  spironolactone (ALDACTONE) 25 MG tablet Take 0.5 tablets (12.5 mg total) by mouth daily. 02/27/20   Arnoldo Lenis, MD  torsemide (DEMADEX) 20 MG tablet Take 3 tablets (60 mg total) by mouth daily. 05/10/20   Murlean Iba, MD    Allergies    Entresto [sacubitril-valsartan] and Other  Review of Systems   Review of Systems  Unable to perform ROS: Severe respiratory distress    Physical Exam Updated Vital Signs BP 124/84   Pulse 90   Temp (!) 97.5 F (36.4 C) (Axillary)   Resp (!) 22   Ht 5\' 10"  (1.778 m)   Wt 93 kg   SpO2 99%   BMI 29.41 kg/m   Physical Exam Vitals and nursing note reviewed.  Constitutional:      General: He is in acute distress.     Appearance: Normal appearance. He is diaphoretic.  HENT:     Head: Normocephalic and atraumatic.     Right Ear: External ear normal.  Eyes:     Extraocular Movements: Extraocular movements intact.     Conjunctiva/sclera: Conjunctivae normal.     Pupils: Pupils are equal, round, and reactive to light.  Cardiovascular:     Rate and Rhythm: Regular rhythm. Tachycardia present.     Pulses: Normal pulses.     Heart sounds: No murmur heard.   Pulmonary:     Effort: Tachypnea, accessory muscle usage, prolonged expiration and respiratory distress present.     Breath sounds: Decreased air movement present. Examination of the right-upper field reveals rales. Examination of the left-upper field reveals rales. Examination of the right-middle field reveals rales. Examination of the left-middle field reveals rales. Examination of the right-lower field  reveals rales. Examination of the left-lower field reveals rales. Rales present.  Abdominal:     General: There is distension.     Palpations: Abdomen is soft.  Musculoskeletal:  General: No swelling or deformity.     Cervical back: Normal range of motion.  Skin:    Comments: Patient skin is warm and dry but diaphoretic  Neurological:     Mental Status: He is alert.     Comments: Moves all extremities unable to assess more     ED Results / Procedures / Treatments   Labs (all labs ordered are listed, but only abnormal results are displayed) Results for orders placed or performed during the hospital encounter of 06/09/20  SARS Coronavirus 2 by RT PCR (hospital order, performed in [place] hospital lab) Nasopharyngeal Nasopharyngeal Swab   Specimen: Nasopharyngeal Swab  Result Value Ref Range   SARS Coronavirus 2 NEGATIVE NEGATIVE  Comprehensive metabolic panel  Result Value Ref Range   Sodium 139 135 - 145 mmol/L   Potassium 3.5 3.5 - 5.1 mmol/L   Chloride 99 98 - 111 mmol/L   CO2 24 22 - 32 mmol/L   Glucose, Bld 421 (H) 70 - 99 mg/dL   BUN 32 (H) 6 - 20 mg/dL   Creatinine, Ser 1.71 (H) 0.61 - 1.24 mg/dL   Calcium 9.2 8.9 - 10.3 mg/dL   Total Protein 7.8 6.5 - 8.1 g/dL   Albumin 3.8 3.5 - 5.0 g/dL   AST 32 15 - 41 U/L   ALT 36 0 - 44 U/L   Alkaline Phosphatase 103 38 - 126 U/L   Total Bilirubin 0.7 0.3 - 1.2 mg/dL   GFR calc non Af Amer 43 (L) >60 mL/min   GFR calc Af Amer 50 (L) >60 mL/min   Anion gap 16 (H) 5 - 15  Brain natriuretic peptide  Result Value Ref Range   B Natriuretic Peptide 519.0 (H) 0.0 - 100.0 pg/mL  CBC with Differential  Result Value Ref Range   WBC 13.9 (H) 4.0 - 10.5 K/uL   RBC 5.52 4.22 - 5.81 MIL/uL   Hemoglobin 15.1 13.0 - 17.0 g/dL   HCT 50.2 39 - 52 %   MCV 90.9 80.0 - 100.0 fL   MCH 27.4 26.0 - 34.0 pg   MCHC 30.1 30.0 - 36.0 g/dL   RDW 13.7 11.5 - 15.5 %   Platelets 273 150 - 400 K/uL   nRBC 0.0 0.0 - 0.2 %   Neutrophils  Relative % 43 %   Neutro Abs 6.0 1.7 - 7.7 K/uL   Lymphocytes Relative 37 %   Lymphs Abs 5.1 (H) 0.7 - 4.0 K/uL   Monocytes Relative 8 %   Monocytes Absolute 1.2 (H) 0 - 1 K/uL   Eosinophils Relative 10 %   Eosinophils Absolute 1.3 (H) 0 - 0 K/uL   Basophils Relative 1 %   Basophils Absolute 0.1 0 - 0 K/uL   Immature Granulocytes 1 %   Abs Immature Granulocytes 0.13 (H) 0.00 - 0.07 K/uL  Blood gas, venous  Result Value Ref Range   FIO2 60.00    pH, Ven 7.120 (LL) 7.25 - 7.43   pCO2, Ven 81.4 (HH) 44 - 60 mmHg   pO2, Ven 74.5 (H) 32 - 45 mmHg   Bicarbonate 18.7 (L) 20.0 - 28.0 mmol/L   Acid-base deficit 3.0 (H) 0.0 - 2.0 mmol/L   O2 Saturation 84.7 %   Patient temperature 36.4   Troponin I (High Sensitivity)  Result Value Ref Range   Troponin I (High Sensitivity) 28 (H) <18 ng/L   Laboratory interpretation all normal except leukocytosis, initial VBG shows respiratory acidosis, hyperglycemia, metabolic acidosis, renal  insufficiency, initial troponin mildly elevated, BNP only mildly elevated    EKG EKG Interpretation  Date/Time:  Sunday June 09 2020 06:11:45 EDT Ventricular Rate:  104 PR Interval:    QRS Duration: 89 QT Interval:  364 QTC Calculation: 479 R Axis:   6 Text Interpretation: Sinus tachycardia Abnormal R-wave progression, early transition Repol abnrm suggests ischemia, diffuse leads Baseline wander No significant change since last tracing 08 May 2020 Confirmed by Rolland Porter [id number]) on 06/09/2020 6:17:51 AM   Radiology DG Chest Port 1 View  Result Date: 06/09/2020 CLINICAL DATA:  Respiratory distress EXAM: PORTABLE CHEST 1 VIEW COMPARISON:  05/10/2020 FINDINGS: Unchanged pulmonary vascular congestion. Cardiomediastinal contours are normal. Unchanged position of left chest wall AICD lead. IMPRESSION: Unchanged pulmonary vascular congestion. Electronically Signed   By: Ulyses Jarred M.D.   On: 06/09/2020 06:48    Procedures .Critical Care Performed by: Rolland Porter, MD Authorized by: Rolland Porter, MD   Critical care provider statement:    Critical care time (minutes):  42   Critical care was necessary to treat or prevent imminent or life-threatening deterioration of the following conditions:  Respiratory failure   Critical care was time spent personally by me on the following activities:  Discussions with consultants, examination of patient, obtaining history from patient or surrogate, ordering and review of laboratory studies, ordering and review of radiographic studies, pulse oximetry, re-evaluation of patient's condition and review of old charts   (including critical care time)  Medications Ordered in ED Medications  nitroGLYCERIN 50 mg in dextrose 5 % 250 mL (0.2 mg/mL) infusion (35 mcg/min Intravenous Rate/Dose Change 06/09/20 0719)  furosemide (LASIX) injection 80 mg (80 mg Intravenous Given 06/09/20 0613)  albuterol (PROVENTIL,VENTOLIN) solution continuous neb (10 mg/hr Nebulization Given by Other 06/09/20 9417)    ED Course  I have reviewed the triage vital signs and the nursing notes.  Pertinent labs & imaging results that were available during my care of the patient were reviewed by me and considered in my medical decision making (see chart for details).    MDM Rules/Calculators/A&P                          Patient was removed from EMS CPAP to our BiPAP by respiratory therapy.  I recognize patient's face, I remembered he had a renal insufficiency, I looked in the chart and he did indeed have a mild renal insufficiency, last time I saw him I gave him 60 mg of Lasix and it took a long time to work.  He was given 80 mg of Lasix IV and started on a nitroglycerin drip for his blood pressure but when it would also help his congestive heart failure.  Recheck at 6:10 AM patient is now interactive, he denies any chest pain.  He indicates he is feeling better.  His blood pressure is 153/106, heart rate 104, respiratory rate 31, pulse ox 99% on  oxygen.  He is no longer diaphoretic except for a small amount on his face.  Venous blood gas done around 6:00 shows respiratory acidosis however patient is doing better.  I will repeat it in another hour.  6:50 AM patient's blood pressure is 118/85 on the nitroglycerin drip.   Recheck at 7 AM blood pressure is 124/84, heart rate 93, respiratory rate 21, pulse ox 99% on BiPAP.  Patient indicates he is feeling better.  He has not had any urinary output yet and does not feel the need  to have urine output yet.  As typical it seems to take him a long time before he gets her response to it.  Patient indicates he is feeling much better.  He is resting comfortably.  I am waiting for the hospitalist to call back to get him admitted.  7:18 AM Dr. Wynetta Emery, hospitalist will admit  Final Clinical Impression(s) / ED Diagnoses Final diagnoses:  Acute respiratory failure with hypoxia and hypercapnia ([place])  Acute on chronic congestive heart failure, unspecified heart failure type [facility])    Rx / DC Orders  Plan admission  Rolland Porter, MD, Barbette Or, MD 06/09/20 [phone]

## 2020-06-09 NOTE — ED Notes (Signed)
Date and time results received: 06/09/20 0818   Test: pCO2 Critical Value: 78.1  Name of Provider Notified: Wynetta Emery, hospitalist   Orders Received? Or Actions Taken?: n/a

## 2020-06-09 NOTE — ED Notes (Signed)
Per Network engineer, the pt reports he is feeling better and wants to go home, the attending MD has been notified re: the pts request

## 2020-06-09 NOTE — ED Triage Notes (Signed)
Pt arrives on CPAP with EMS from home due to severe resp distress. 90% on CPAP.

## 2020-06-09 NOTE — ED Notes (Signed)
Pt removed from [facility] on 3 L Finger will continue to monitor

## 2020-06-09 NOTE — ED Notes (Signed)
Pt. Requested removal of external urinary catheter. States he felt like he was urinating on self. Catheter removed and pt voided approx 850 ml in urinal at bedside. Pt was able to manipulate use of urinal at bedside independently. Informed pt to still call for assistance due to the amount of cords/tubes he was attached to in order to prevent tripping.

## 2020-06-09 NOTE — ED Notes (Signed)
Pt sitting up in bed talking with wife, pt talking in full sentences, resps even and unlabored, pt offers no complaints at this time.

## 2020-06-10 ENCOUNTER — Observation Stay (HOSPITAL_COMMUNITY): Payer: BC Managed Care – PPO

## 2020-06-10 DIAGNOSIS — J9621 Acute and chronic respiratory failure with hypoxia: Secondary | ICD-10-CM | POA: Diagnosis not present

## 2020-06-10 DIAGNOSIS — E559 Vitamin D deficiency, unspecified: Secondary | ICD-10-CM | POA: Diagnosis present

## 2020-06-10 DIAGNOSIS — I5021 Acute systolic (congestive) heart failure: Secondary | ICD-10-CM | POA: Diagnosis not present

## 2020-06-10 DIAGNOSIS — J9622 Acute and chronic respiratory failure with hypercapnia: Secondary | ICD-10-CM

## 2020-06-10 DIAGNOSIS — J811 Chronic pulmonary edema: Secondary | ICD-10-CM | POA: Diagnosis not present

## 2020-06-10 DIAGNOSIS — R931 Abnormal findings on diagnostic imaging of heart and coronary circulation: Secondary | ICD-10-CM | POA: Diagnosis not present

## 2020-06-10 DIAGNOSIS — G4733 Obstructive sleep apnea (adult) (pediatric): Secondary | ICD-10-CM | POA: Diagnosis not present

## 2020-06-10 DIAGNOSIS — J449 Chronic obstructive pulmonary disease, unspecified: Secondary | ICD-10-CM

## 2020-06-10 DIAGNOSIS — J9602 Acute respiratory failure with hypercapnia: Secondary | ICD-10-CM | POA: Diagnosis not present

## 2020-06-10 LAB — COMPREHENSIVE METABOLIC PANEL
ALT: 29 U/L (ref 0–44)
AST: 22 U/L (ref 15–41)
Albumin: 3.5 g/dL (ref 3.5–5.0)
Alkaline Phosphatase: 75 U/L (ref 38–126)
Anion gap: 13 (ref 5–15)
BUN: 27 mg/dL — ABNORMAL HIGH (ref 6–20)
CO2: 27 mmol/L (ref 22–32)
Calcium: 8.8 mg/dL — ABNORMAL LOW (ref 8.9–10.3)
Chloride: 99 mmol/L (ref 98–111)
Creatinine, Ser: 1.25 mg/dL — ABNORMAL HIGH (ref 0.61–1.24)
GFR calc Af Amer: >60 mL/min (ref >60)
GFR calc non Af Amer: >60 mL/min (ref >60)
Glucose, Bld: 203 mg/dL — ABNORMAL HIGH (ref 70–99)
Potassium: 3.9 mmol/L (ref 3.5–5.1)
Sodium: 139 mmol/L (ref 135–145)
Total Bilirubin: 1.1 mg/dL (ref 0.3–1.2)
Total Protein: 7 g/dL (ref 6.5–8.1)

## 2020-06-10 LAB — GLUCOSE, CAPILLARY
Glucose-Capillary: 198 mg/dL — ABNORMAL HIGH (ref 70–99)
Glucose-Capillary: 238 mg/dL — ABNORMAL HIGH (ref 70–99)

## 2020-06-10 LAB — MAGNESIUM: Magnesium: 2.2 mg/dL (ref 1.7–2.4)

## 2020-06-10 LAB — BRAIN NATRIURETIC PEPTIDE: B Natriuretic Peptide: 509 pg/mL — ABNORMAL HIGH (ref 0.0–100.0)

## 2020-06-10 MED ORDER — VITAMIN D (ERGOCALCIFEROL) 1.25 MG (50000 UNIT) PO CAPS
50000.0000 [IU] | ORAL_CAPSULE | ORAL | Status: DC
  Administered 2020-06-10: 50000 [IU] via ORAL
  Filled 2020-06-10: qty 1

## 2020-06-10 MED ORDER — VITAMIN D (ERGOCALCIFEROL) 1.25 MG (50000 UNIT) PO CAPS: 50000.0000 [IU] | ORAL_CAPSULE | ORAL | 0 refills | Status: AC

## 2020-06-10 NOTE — Discharge Summary (Addendum)
Physician Discharge Summary  Antonio Cobb GXQ:[id number] DOB: November 03, 1961 DOA: 06/09/2020  PCP: [name], MD Cardiologist: Branch  Admit date: 06/09/2020 Discharge date: 06/10/2020  Admitted From:  Home  Disposition: Home   Recommendations for Outpatient Follow-up:  1. Follow up with PCP and cardiology as already scheduled   Discharge Condition: STABLE   CODE STATUS: FULL    Brief Hospitalization Summary: Please see all hospital notes, images, labs for full details of the hospitalization. ADMISSION HPI: Antonio Cobb is a 59 y.o. male with medical history significant for ischemic cardiomyopathy with an EF 20% on last echo done in May 2021 implanted ICD followed by Dr. Lovena Le, hyperlipidemia, COPD, OSA, poorly controlled type 2 diabetes mellitus and hypertension who was recently discharged from [facility] in May 2021 after being admitted for acute respiratory failure secondary to systolic CHF exacerbation.  His EF was noted at that time to be down from 25% to 20%.  He has an appointment to see Dr. Haroldine Laws in August 2021 at the advanced heart failure clinic.  Unfortunately he developed a case of acute gout likely precipitated by diuretics and saw his PCP on 05/28/2020 and was treated with a short burst of prednisone and later started on allopurinol.  He tends to develop flash pulmonary edema especially during the evening hours.  His dry weights are usually 199-205 pounds. He reportedly developed severe shortness of breath and respiratory distress and arrived by EMS to the emergency department on CPAP.  He recently started home CPAP after equipment was delivered after hospitalization last month.  His pulse ox was 92% on CPAP.  EMS reported that he had a systolic blood pressure of 220 and he was given 2 nitroglycerin to help with blood pressure.  He was not having chest pain symptoms.  He was placed on BiPAP after arrival to the emergency department.  His ABG showed  findings of severe acute respiratory acidosis with high PCO2 levels.   ED Course: He arrived on CPAP, placed on BiPAP.  Venous blood gas pH 7.120 PCO2 81.4 PO2 74.5.  Sodium 139 potassium 3.5 glucose 421 BUN 32 creatinine 1.71.  Cardiac BNP 519.  Troponin 28.  WBC 13.9.  Chest x-ray with pulmonary vascular congestion.  EKG with sinus tachycardia.  He was placed on IV nitroglycerin infusion.  Fortunately patient started to improve on BiPAP therapy which he has tolerated well in the past.  Patient was given 80 mg of Lasix IV maintained on BiPAP and admission was requested.    Pt was admitted with acute flash pulmonary edema and had acute respiratory failure with hypercarbia and was treated briefly with bipap and then resume on nightly CPAP which he started at home last month.  He was given IV lasix for diuresis and responded well.  He is now at his baseline dry weight.  His repeat chest xray shows resolution of the pulmonary edema and he feels at his baseline and wants to go home.  His renal function is improved.  He has close outpatient follow up scheduled with cardiology.  He was noted to have very low vitamin D levels and was given supplementation.  He is stable to discharge home today.    Discharge Diagnoses:  Principal Problem:   Acute systolic CHF (congestive heart failure) (HCC) Active Problems:   Insulin-requiring or dependent type II diabetes mellitus (HCC)   Mixed hyperlipidemia   Overweight with body mass index (BMI) of 29 to 29.9 in adult   Essential hypertension  Abnormal echocardiogram   DM type 2 causing vascular disease (HCC)   Personal history of noncompliance with medical treatment, presenting hazards to health   Chronic systolic (congestive) heart failure (HCC)   Pulmonary edema   Flash pulmonary edema (HCC)   CAD (coronary artery disease)- s/p STEMI with DES to [facility] in 2013/S/p AICD 01/2018   Ischemic cardiomyopathy with implantable cardioverter-defibrillator (ICD)/s/p STEMI  with DES to [place] in 2013/S/p AICD 01/2018   Dyspnea   OSA (obstructive sleep apnea)    Acute respiratory failure with hypercapnia (HCC)   COPD (chronic obstructive pulmonary disease) ([place])   Acute on chronic respiratory failure with hypoxia and hypercapnia (HCC)   Vitamin D deficiency   Discharge Instructions:  Allergies as of 06/10/2020      Reactions   Entresto [sacubitril-valsartan]    Dizziness, Coughing   Other    Seasonal allergies  - has to use inhaler      Medication List    STOP taking these medications   predniSONE 20 MG tablet Commonly known as: DELTASONE     TAKE these medications   allopurinol 300 MG tablet Commonly known as: ZYLOPRIM Take 1 tablet (300 mg total) by mouth daily.   aspirin 81 MG EC tablet Take 1 tablet (81 mg total) by mouth daily with breakfast.   docusate sodium 100 MG capsule Commonly known as: COLACE Take 100 mg by mouth daily.   famotidine 20 MG tablet Commonly known as: PEPCID Take 1 tablet (20 mg total) by mouth at bedtime.   hydrOXYzine 25 MG tablet Commonly known as: ATARAX/VISTARIL Take one tablet by mouth at bedtime, for anxiety and sleep What changed:   how much to take  how to take this  when to take this   insulin aspart 100 UNIT/ML FlexPen Commonly known as: NOVOLOG Inject 10-16 Units into the skin 3 (three) times daily with meals.   Lantus SoloStar 100 UNIT/ML Solostar Pen Generic drug: insulin glargine Inject 50 Units into the skin at bedtime.   loratadine 10 MG tablet Commonly known as: CLARITIN Take 1 tablet (10 mg total) by mouth daily. What changed:   when to take this  reasons to take this   losartan 25 MG tablet Commonly known as: COZAAR Take 1 tablet (25 mg total) by mouth daily.   Melatonin 10 MG Tabs Take 2 tablets by mouth at bedtime as needed (for sleep).   metoprolol succinate 50 MG 24 hr tablet Commonly known as: TOPROL-XL TAKE 1 TABLET BY MOUTH EVERY DAY WITH FOOD What changed:    how much to take  how to take this  when to take this   multivitamin with minerals tablet Take 1 tablet by mouth daily.   pantoprazole 40 MG tablet Commonly known as: Protonix Take 1 tablet (40 mg total) by mouth daily. Take 30-64min before first meal of the day.   potassium chloride SA 20 MEQ tablet Commonly known as: KLOR-CON Take 1 tablet (20 mEq total) by mouth daily.   rosuvastatin 40 MG tablet Commonly known as: CRESTOR Take 1 tablet (40 mg total) by mouth daily.   sertraline 25 MG tablet Commonly known as: ZOLOFT Take 1 tablet (25 mg total) by mouth daily. For anxiety/panic attacks   spironolactone 25 MG tablet Commonly known as: Aldactone Take 0.5 tablets (12.5 mg total) by mouth daily.   torsemide 20 MG tablet Commonly known as: DEMADEX Take 3 tablets (60 mg total) by mouth daily.   Vitamin D (Ergocalciferol) 1.25  MG (50000 UNIT) Caps capsule Commonly known as: DRISDOL Take 1 capsule (50,000 Units total) by mouth every 7 (seven) days.       Follow-up Information    [name], MD Follow up in 1 week(s).   Specialty: Family Medicine Contact information: [address] [place] [address] [phone]        Arnoldo Lenis, MD .   Specialty: Cardiology Contact information: [place] [address] [phone]        Evans Lance, MD .   Specialty: Cardiology Contact information: [place] [address] [phone]              Allergies  Allergen Reactions  . Entresto [Sacubitril-Valsartan]     Dizziness, Coughing  . Other     Seasonal allergies  - has to use inhaler   Allergies as of 06/10/2020      Reactions   Entresto [sacubitril-valsartan]    Dizziness, Coughing   Other    Seasonal allergies  - has to use inhaler      Medication List    STOP taking these medications   predniSONE 20 MG tablet Commonly known as: DELTASONE     TAKE these medications    allopurinol 300 MG tablet Commonly known as: ZYLOPRIM Take 1 tablet (300 mg total) by mouth daily.   aspirin 81 MG EC tablet Take 1 tablet (81 mg total) by mouth daily with breakfast.   docusate sodium 100 MG capsule Commonly known as: COLACE Take 100 mg by mouth daily.   famotidine 20 MG tablet Commonly known as: PEPCID Take 1 tablet (20 mg total) by mouth at bedtime.   hydrOXYzine 25 MG tablet Commonly known as: ATARAX/VISTARIL Take one tablet by mouth at bedtime, for anxiety and sleep What changed:   how much to take  how to take this  when to take this   insulin aspart 100 UNIT/ML FlexPen Commonly known as: NOVOLOG Inject 10-16 Units into the skin 3 (three) times daily with meals.   Lantus SoloStar 100 UNIT/ML Solostar Pen Generic drug: insulin glargine Inject 50 Units into the skin at bedtime.   loratadine 10 MG tablet Commonly known as: CLARITIN Take 1 tablet (10 mg total) by mouth daily. What changed:   when to take this  reasons to take this   losartan 25 MG tablet Commonly known as: COZAAR Take 1 tablet (25 mg total) by mouth daily.   Melatonin 10 MG Tabs Take 2 tablets by mouth at bedtime as needed (for sleep).   metoprolol succinate 50 MG 24 hr tablet Commonly known as: TOPROL-XL TAKE 1 TABLET BY MOUTH EVERY DAY WITH FOOD What changed:   how much to take  how to take this  when to take this   multivitamin with minerals tablet Take 1 tablet by mouth daily.   pantoprazole 40 MG tablet Commonly known as: Protonix Take 1 tablet (40 mg total) by mouth daily. Take 30-34min before first meal of the day.   potassium chloride SA 20 MEQ tablet Commonly known as: KLOR-CON Take 1 tablet (20 mEq total) by mouth daily.   rosuvastatin 40 MG tablet Commonly known as: CRESTOR Take 1 tablet (40 mg total) by mouth daily.   sertraline 25 MG tablet Commonly known as: ZOLOFT Take 1 tablet (25 mg total) by mouth daily. For anxiety/panic attacks    spironolactone 25 MG tablet Commonly known as: Aldactone Take 0.5 tablets (12.5 mg  total) by mouth daily.   torsemide 20 MG tablet Commonly known as: DEMADEX Take 3 tablets (60 mg total) by mouth daily.   Vitamin D (Ergocalciferol) 1.25 MG (50000 UNIT) Caps capsule Commonly known as: DRISDOL Take 1 capsule (50,000 Units total) by mouth every 7 (seven) days.       Procedures/Studies: DG Chest Port 1 View  Result Date: 06/10/2020 CLINICAL DATA:  Acute pulmonary edema EXAM: PORTABLE CHEST 1 VIEW COMPARISON:  Yesterday FINDINGS: Improved vascular congestion. Stable heart size that is within normal limits. Defibrillator lead into the right ventricle. No visible effusion or pneumothorax. Linear scar-like opacity over the right upper lobe seen since at least 2009 IMPRESSION: Improved vascular congestion. Electronically Signed   By: Monte Fantasia M.D.   On: 06/10/2020 04:46   DG Chest Port 1 View  Result Date: 06/09/2020 CLINICAL DATA:  Respiratory distress EXAM: PORTABLE CHEST 1 VIEW COMPARISON:  05/10/2020 FINDINGS: Unchanged pulmonary vascular congestion. Cardiomediastinal contours are normal. Unchanged position of left chest wall AICD lead. IMPRESSION: Unchanged pulmonary vascular congestion. Electronically Signed   By: Ulyses Jarred M.D.   On: 06/09/2020 06:48   CUP PACEART REMOTE DEVICE CHECK  Result Date: 05/13/2020 Scheduled remote reviewed. Normal device function. 4 NST events, available EGM 12 beat NSVT. Next remote 91 days. [place]     Subjective: Pt says he feels much better, he wants to go home, no CP, no SOB and no palpitations.   Discharge Exam: Vitals:   06/10/20 0740 06/10/20 0902  BP:  (!) 147/90  Pulse:  86  Resp:  20  Temp:  98 F (36.7 C)  SpO2: 95% 96%   Vitals:   06/10/20 0441 06/10/20 0700 06/10/20 0740 06/10/20 0902  BP: 140/90   (!) 147/90  Pulse: 93   86  Resp:    20  Temp: 97.8 F (36.6 C)   98 F (36.7 C)  TempSrc: Oral     SpO2: 97%  95%  96%  Weight:  95 kg    Height:       General: Pt is alert, awake, not in acute distress Cardiovascular: normal S1/S2 +, no rubs, no gallops Respiratory: CTA bilaterally, no wheezing, no rhonchi Abdominal: Soft, NT, ND, bowel sounds + Extremities: no edema, no cyanosis   The results of significant diagnostics from this hospitalization (including imaging, microbiology, ancillary and laboratory) are listed below for reference.     Microbiology: Recent Results (from the past 240 hour(s))  SARS Coronavirus 2 by RT PCR (hospital order, performed in [facility] hospital lab) Nasopharyngeal Nasopharyngeal Swab     Status: None   Collection Time: 06/09/20  6:21 AM   Specimen: Nasopharyngeal Swab  Result Value Ref Range Status   SARS Coronavirus 2 NEGATIVE NEGATIVE Final    Comment: (NOTE) SARS-CoV-2 target nucleic acids are NOT DETECTED.  The SARS-CoV-2 RNA is generally detectable in upper and lower respiratory specimens during the acute phase of infection. The lowest concentration of SARS-CoV-2 viral copies this assay can detect is 250 copies / mL. A negative result does not preclude SARS-CoV-2 infection and should not be used as the sole basis for treatment or other patient management decisions.  A negative result may occur with improper specimen collection / handling, submission of specimen other than nasopharyngeal swab, presence of viral mutation(s) within the areas targeted by this assay, and inadequate number of viral copies (<250 copies / mL). A negative result must be combined with clinical observations, patient history, and epidemiological information.  Fact Sheet for Patients:   StrictlyIdeas.no  Fact Sheet for Healthcare Providers: BankingDealers.co.za  This test is not yet approved or  cleared by the [place] FDA and has been authorized for detection and/or diagnosis of SARS-CoV-2 by FDA under an Emergency Use  Authorization (EUA).  This EUA will remain in effect (meaning this test can be used) for the duration of the COVID-19 declaration under Section 564(b)(1) of the Act, 21 U.S.C. section 360bbb-3(b)(1), unless the authorization is terminated or revoked sooner.  Performed at [facility], [address] [place], [place] [address]      Labs: BNP (last 3 results) Recent Labs    05/10/20 0717 06/09/20 0601 06/10/20 0347  BNP 514.0* 519.0* 314.9*   Basic Metabolic Panel: Recent Labs  Lab 06/09/20 0601 06/09/20 0744 06/10/20 0347  NA 139  --  139  K 3.5  --  3.9  CL 99  --  99  CO2 24  --  27  GLUCOSE 421*  --  203*  BUN 32*  --  27*  CREATININE 1.71*  --  1.25*  CALCIUM 9.2  --  8.8*  MG  --  2.4 2.2   Liver Function Tests: Recent Labs  Lab 06/09/20 0601 06/10/20 0347  AST 32 22  ALT 36 29  ALKPHOS 103 75  BILITOT 0.7 1.1  PROT 7.8 7.0  ALBUMIN 3.8 3.5   No results for input(s): LIPASE, AMYLASE in the last 168 hours. No results for input(s): AMMONIA in the last 168 hours. CBC: Recent Labs  Lab 06/09/20 0601  WBC 13.9*  NEUTROABS 6.0  HGB 15.1  HCT 50.2  MCV 90.9  PLT 273   Cardiac Enzymes: No results for input(s): CKTOTAL, CKMB, CKMBINDEX, TROPONINI in the last 168 hours. BNP: Invalid input(s): POCBNP CBG: Recent Labs  Lab 06/09/20 1727 06/09/20 2132 06/10/20 0305 06/10/20 0731  GLUCAP 171* 212* 198* 238*   D-Dimer No results for input(s): DDIMER in the last 72 hours. Hgb A1c No results for input(s): HGBA1C in the last 72 hours. Lipid Profile Recent Labs    06/09/20 0744  CHOL 242*  HDL 51  LDLCALC 149*  TRIG 208*  CHOLHDL 4.7   Thyroid function studies Recent Labs    06/09/20 0601  TSH 4.728*   Anemia work up No results for input(s): VITAMINB12, FOLATE, FERRITIN, TIBC, IRON, RETICCTPCT in the last 72 hours. Urinalysis    Component Value Date/Time   COLORURINE COLORLESS (A) 09/06/2017 0452   APPEARANCEUR CLEAR 09/06/2017 0452    LABSPEC 1.006 09/06/2017 0452   PHURINE 5.0 09/06/2017 0452   GLUCOSEU >=500 (A) 09/06/2017 0452   GLUCOSEU NEG mg/dL 03/04/2007 0829   HGBUR NEGATIVE 09/06/2017 0452   HGBUR negative 11/13/2010 1522   BILIRUBINUR NEGATIVE 09/06/2017 0452   KETONESUR NEGATIVE 09/06/2017 0452   PROTEINUR NEGATIVE 09/06/2017 0452   UROBILINOGEN 0.2 03/23/2015 1805   NITRITE NEGATIVE 09/06/2017 0452   LEUKOCYTESUR NEGATIVE 09/06/2017 0452   Sepsis Labs Invalid input(s): PROCALCITONIN,  WBC,  LACTICIDVEN Microbiology Recent Results (from the past 240 hour(s))  SARS Coronavirus 2 by RT PCR (hospital order, performed in [facility] hospital lab) Nasopharyngeal Nasopharyngeal Swab     Status: None   Collection Time: 06/09/20  6:21 AM   Specimen: Nasopharyngeal Swab  Result Value Ref Range Status   SARS Coronavirus 2 NEGATIVE NEGATIVE Final    Comment: (NOTE) SARS-CoV-2 target nucleic acids are NOT DETECTED.  The SARS-CoV-2 RNA is generally detectable in upper and lower respiratory  specimens during the acute phase of infection. The lowest concentration of SARS-CoV-2 viral copies this assay can detect is 250 copies / mL. A negative result does not preclude SARS-CoV-2 infection and should not be used as the sole basis for treatment or other patient management decisions.  A negative result may occur with improper specimen collection / handling, submission of specimen other than nasopharyngeal swab, presence of viral mutation(s) within the areas targeted by this assay, and inadequate number of viral copies (<250 copies / mL). A negative result must be combined with clinical observations, patient history, and epidemiological information.  Fact Sheet for Patients:   StrictlyIdeas.no  Fact Sheet for Healthcare Providers: BankingDealers.co.za  This test is not yet approved or  cleared by the [place] FDA and has been authorized for detection and/or  diagnosis of SARS-CoV-2 by FDA under an Emergency Use Authorization (EUA).  This EUA will remain in effect (meaning this test can be used) for the duration of the COVID-19 declaration under Section 564(b)(1) of the Act, 21 U.S.C. section 360bbb-3(b)(1), unless the authorization is terminated or revoked sooner.  Performed at [facility], [address], [place], [place] [address]   I spoke with Levy Sjogren of utilization review and inpatient status can remain unchanged as he required bipap while in ED.   Time coordinating discharge:   SIGNED:  Irwin Brakeman, MD  [facility] 06/10/2020, 9:57 AM How to contact the [facility] Attending or Consulting provider [place] or covering provider during after hours [place], for this patient?  1. Check the care team in [facility] and look for a) attending/consulting [facility] provider listed and b) the [facility] team listed 2. Log into [email or web address] and use [place]'s universal password to access. If you do not have the password, please contact the hospital operator. 3. Locate the [facility] provider you are looking for under [facility] and page to a number that you can be directly reached. 4. If you still have difficulty reaching the provider, please page the [facility] (Director on Call) for the Hospitalists listed on amion for assistance.

## 2020-06-10 NOTE — Plan of Care (Signed)

## 2020-06-10 NOTE — Progress Notes (Signed)
Nsg Discharge Note  Admit Date:  06/09/2020 Discharge date: 06/10/2020   Antonio Cobb to be D/C'd Home per MD order.  AVS completed.  Copy for chart, and copy for patient signed, and dated. Patient/caregiver able to verbalize understanding.  Discharge Medication: Allergies as of 06/10/2020      Reactions   Entresto [sacubitril-valsartan]    Dizziness, Coughing   Other    Seasonal allergies  - has to use inhaler      Medication List    STOP taking these medications   predniSONE 20 MG tablet Commonly known as: DELTASONE     TAKE these medications   allopurinol 300 MG tablet Commonly known as: ZYLOPRIM Take 1 tablet (300 mg total) by mouth daily.   aspirin 81 MG EC tablet Take 1 tablet (81 mg total) by mouth daily with breakfast.   docusate sodium 100 MG capsule Commonly known as: COLACE Take 100 mg by mouth daily.   famotidine 20 MG tablet Commonly known as: PEPCID Take 1 tablet (20 mg total) by mouth at bedtime.   hydrOXYzine 25 MG tablet Commonly known as: ATARAX/VISTARIL Take one tablet by mouth at bedtime, for anxiety and sleep What changed:   how much to take  how to take this  when to take this   insulin aspart 100 UNIT/ML FlexPen Commonly known as: NOVOLOG Inject 10-16 Units into the skin 3 (three) times daily with meals.   Lantus SoloStar 100 UNIT/ML Solostar Pen Generic drug: insulin glargine Inject 50 Units into the skin at bedtime.   loratadine 10 MG tablet Commonly known as: CLARITIN Take 1 tablet (10 mg total) by mouth daily. What changed:   when to take this  reasons to take this   losartan 25 MG tablet Commonly known as: COZAAR Take 1 tablet (25 mg total) by mouth daily.   Melatonin 10 MG Tabs Take 2 tablets by mouth at bedtime as needed (for sleep).   metoprolol succinate 50 MG 24 hr tablet Commonly known as: TOPROL-XL TAKE 1 TABLET BY MOUTH EVERY DAY WITH FOOD What changed:   how much to take  how to take this  when  to take this   multivitamin with minerals tablet Take 1 tablet by mouth daily.   pantoprazole 40 MG tablet Commonly known as: Protonix Take 1 tablet (40 mg total) by mouth daily. Take 30-85min before first meal of the day.   potassium chloride SA 20 MEQ tablet Commonly known as: KLOR-CON Take 1 tablet (20 mEq total) by mouth daily.   rosuvastatin 40 MG tablet Commonly known as: CRESTOR Take 1 tablet (40 mg total) by mouth daily.   sertraline 25 MG tablet Commonly known as: ZOLOFT Take 1 tablet (25 mg total) by mouth daily. For anxiety/panic attacks   spironolactone 25 MG tablet Commonly known as: Aldactone Take 0.5 tablets (12.5 mg total) by mouth daily.   torsemide 20 MG tablet Commonly known as: DEMADEX Take 3 tablets (60 mg total) by mouth daily.   Vitamin D (Ergocalciferol) 1.25 MG (50000 UNIT) Caps capsule Commonly known as: DRISDOL Take 1 capsule (50,000 Units total) by mouth every 7 (seven) days.       Discharge Assessment: Vitals:   06/10/20 0740 06/10/20 0902  BP:  (!) 147/90  Pulse:  86  Resp:  20  Temp:  98 F (36.7 C)  SpO2: 95% 96%   Skin clean, dry and intact without evidence of skin break down, no evidence of skin tears noted. IV catheter  discontinued intact. Site without signs and symptoms of complications - no redness or edema noted at insertion site, patient denies c/o pain - only slight tenderness at site.  Dressing with slight pressure applied.  D/c Instructions-Education: Discharge instructions given to patient/family with verbalized understanding. D/c education completed with patient/family including follow up instructions, medication list, d/c activities limitations if indicated, with other d/c instructions as indicated by MD - patient able to verbalize understanding, all questions fully answered. Patient instructed to return to ED, call 911, or call MD for any changes in condition.  Patient escorted via [place], and D/C home via private  auto.  Dorcas Mcmurray, LPN 03/12/761 26:33 AM

## 2020-06-11 ENCOUNTER — Ambulatory Visit: Payer: BC Managed Care – PPO | Admitting: "Endocrinology

## 2020-06-12 ENCOUNTER — Telehealth: Payer: Self-pay

## 2020-06-12 NOTE — Telephone Encounter (Signed)
[id number]  Attempted to contact patient to complete [facility] telephone call. No answer. Left message requesting call back. Will try again later. 3rd attempt

## 2020-06-12 NOTE — Telephone Encounter (Signed)
Transition Care Management Follow-up Telephone Call   Date discharged?   06/10/20             How have you been since you were released from the hospital?    Do you understand why you were in the hospital?    Do you understand the discharge instructions?   Where were you discharged to?    Items Reviewed:  Medications reviewed:   Allergies reviewed:   Dietary changes reviewed:   Referrals reviewed:    Functional Questionnaire:   Activities of Daily Living (ADLs):      Any transportation issues/concerns?:    Any patient concerns?    Confirmed importance and date/time of follow-up visits scheduled      Confirmed with patient if condition begins to worsen call PCP or go to the ER.  Patient was given the office number and encouraged to call back with question or concerns.  :

## 2020-06-12 NOTE — Telephone Encounter (Signed)
[id number]  Attempted to contact patient to complete [facility] telephone call. No answer. Left message requesting call back. Will try again later. 2nd attempt

## 2020-06-25 ENCOUNTER — Ambulatory Visit (INDEPENDENT_AMBULATORY_CARE_PROVIDER_SITE_OTHER): Payer: BC Managed Care – PPO | Admitting: Student

## 2020-06-25 ENCOUNTER — Other Ambulatory Visit: Payer: Self-pay

## 2020-06-25 ENCOUNTER — Encounter: Payer: Self-pay | Admitting: Student

## 2020-06-25 VITALS — BP 112/60 | HR 84 | Ht 70.0 in | Wt 211.0 lb

## 2020-06-25 DIAGNOSIS — Z79899 Other long term (current) drug therapy: Secondary | ICD-10-CM | POA: Diagnosis not present

## 2020-06-25 DIAGNOSIS — I2510 Atherosclerotic heart disease of native coronary artery without angina pectoris: Secondary | ICD-10-CM

## 2020-06-25 DIAGNOSIS — I340 Nonrheumatic mitral (valve) insufficiency: Secondary | ICD-10-CM

## 2020-06-25 DIAGNOSIS — I10 Essential (primary) hypertension: Secondary | ICD-10-CM

## 2020-06-25 DIAGNOSIS — N183 Chronic kidney disease, stage 3 unspecified: Secondary | ICD-10-CM

## 2020-06-25 DIAGNOSIS — I5042 Chronic combined systolic (congestive) and diastolic (congestive) heart failure: Secondary | ICD-10-CM

## 2020-06-25 DIAGNOSIS — G4733 Obstructive sleep apnea (adult) (pediatric): Secondary | ICD-10-CM

## 2020-06-25 MED ORDER — SPIRONOLACTONE 25 MG PO TABS
25.0000 mg | ORAL_TABLET | Freq: Every day | ORAL | 3 refills | Status: DC
Start: 2020-06-25 — End: 2020-07-12

## 2020-06-25 NOTE — Progress Notes (Addendum)
Cardiology Office Note    Date:  06/25/2020   ID:  Antonio Cobb, DOB Sep 20, 1961, MRN [id number]  PCP:  [name], MD  Cardiologist: [name], MD   EP: Dr. Lovena Le  Chief Complaint  Patient presents with  . Hospitalization Follow-up    History of Present Illness:    Antonio Cobb is a 59 y.o. male with past medical history of CAD(s/p STEMI in 2013 with DES to LCx, patent by repeat cath in 05/2016 with moderate residual CAD along RCA and LAD), chronic combined systolic and diastolic CHF (EF 97-35% by echo in 09/2016, 25-30% in 08/2017 and 20% by most recent echo in 04/2020, s/p ICD placement in 01/2018), HTN, HLD, IDDM and OSA (on CPAP) who presents to the office today for hospital follow-up.   He was admitted to [facility] in 04/2020 for a CHF exacerbation and responded quickly with IV Lasix. Repeat echo did show his EF remained reduced at 20% and he had Grade 2 DD. RV function was normal. His outpatient Torsemide was increased to 60mg  daily and follow-up was arranged for 2 weeks but he canceled the visit.   He was again readmitted from 6/27 - 06/10/2020 for flash pulmonary edema as BNP was elevated to 519 and CXR was consistent with vascular congestion. He again responded quickly with IV Lasix and was discharged the following morning. Torsemide 60mg  daily was resumed and weight at the time of discharge was 209 lbs.   In talking with the patient and his wife today, he reports overall doing well since his recent hospitalization. He is looking forward to his 59th birthday party next week. He denies any recent chest pain and reports his breathing has overall been stable since discharge. No recent orthopnea, PND or lower extremity edema. Reports good compliance with CPAP and feels like this has helped significantly with his fatigue. He does utilize his compression stockings on a daily basis. Weight has been stable on his home scales and they are trying to follow a  low-sodium diet.   Past Medical History:  Diagnosis Date  . Acute CHF (congestive heart failure) ([place]) 01/21/2020  . Acute on chronic combined systolic and diastolic CHF (congestive heart failure) ([place])   . Acute respiratory failure with hypoxia and hypercapnia (H[facility])   . Acute systolic CHF (congestive heart failure) ([place]) 05/08/2020  . Alcohol abuse   . CAD (coronary artery disease)    a. s/p inferolateral STEMI in 05/2012 with DES to LCx b. patent stent by cath in 05/2016 with moderate RCA and LAD disease  . Cardiomyopathy ([place])    a. LVEF 30-35% by echo in 09/2016. b. 08/2017: echo showing EF remains reduced at 25-30% c. s/p [employer] ICD placement in 01/2018  . CHF (congestive heart failure) ([place])   . CHF exacerbation ([place]) 11/18/2019  . COPD (chronic obstructive pulmonary disease) ([place])   . Diabetes mellitus, type 2 ([place])   . Essential hypertension   . Hypertensive urgency   . Mitral regurgitation    Moderate  . Mixed hyperlipidemia   . Myocardial infarction ([place]) 05/23/2012   Inferolateral STEMI  . Obesity     Past Surgical History:  Procedure Laterality Date  . CARDIAC CATHETERIZATION  2 yrs ago  . CARDIAC CATHETERIZATION N/A 05/15/2016   Procedure: Right/Left Heart Cath and Coronary Angiography;  Surgeon: [name], MD;  Location: [place] [facility];  Service: Cardiovascular;  Laterality: N/A;  . CORONARY STENT PLACEMENT  05/23/12  .  ICD IMPLANT N/A 02/01/2018   Procedure: ICD IMPLANT;  Surgeon: Evans Lance, MD;  Location: [place] [facility];  Service: Cardiovascular;  Laterality: N/A;  . LEFT HEART CATHETERIZATION WITH CORONARY ANGIOGRAM N/A 05/23/2012   Procedure: LEFT HEART CATHETERIZATION WITH CORONARY ANGIOGRAM;  Surgeon: [name], MD;  Location: [facility] C[facility] LAB;  Service: Cardiovascular;  Laterality: N/A;  . None    . PERCUTANEOUS CORONARY STENT INTERVENTION (PCI-S) N/A 05/23/2012   Procedure: PERCUTANEOUS CORONARY STENT INTERVENTION (PCI-S);   Surgeon: [name], MD;  Location: [facility] CATH LAB;  Service: Cardiovascular;  Laterality: N/A;  . POLYPECTOMY  10/16/2011   Procedure: POLYPECTOMY;  Surgeon: [name], MD;  Location: [facility];  Service: Endoscopy;;  Polypoid Lesion, Transverse and Sigmoid Colon    Current Medications: Outpatient Medications Prior to Visit  Medication Sig Dispense Refill  . allopurinol (ZYLOPRIM) 300 MG tablet Take 1 tablet (300 mg total) by mouth daily. 30 tablet 6  . aspirin EC 81 MG EC tablet Take 1 tablet (81 mg total) by mouth daily with breakfast. 30 tablet 3  . docusate sodium (COLACE) 100 MG capsule Take 100 mg by mouth daily.    . famotidine (PEPCID) 20 MG tablet Take 1 tablet (20 mg total) by mouth at bedtime.    . hydrOXYzine (ATARAX/VISTARIL) 25 MG tablet Take one tablet by mouth at bedtime, for anxiety and sleep (Patient taking differently: Take 25 mg by mouth at bedtime. Take one tablet by mouth at bedtime, for anxiety and sleep) 30 tablet 5  . insulin aspart (NOVOLOG) 100 UNIT/ML FlexPen Inject 10-16 Units into the skin 3 (three) times daily with meals. 5 pen 3  . insulin glargine (LANTUS SOLOSTAR) 100 UNIT/ML Solostar Pen Inject 50 Units into the skin at bedtime. 10 pen 2  . losartan (COZAAR) 25 MG tablet Take 1 tablet (25 mg total) by mouth daily. 30 tablet 5  . Melatonin 10 MG TABS Take 2 tablets by mouth at bedtime as needed (for sleep).     . metoprolol succinate (TOPROL-XL) 50 MG 24 hr tablet TAKE 1 TABLET BY MOUTH EVERY DAY WITH FOOD (Patient taking differently: Take 50 mg by mouth daily. TAKE 1 TABLET BY MOUTH EVERY DAY WITH FOOD) 90 tablet 3  . Multiple Vitamins-Minerals (MULTIVITAMIN WITH MINERALS) tablet Take 1 tablet by mouth daily. 120 tablet 2  . pantoprazole (PROTONIX) 40 MG tablet Take 1 tablet (40 mg total) by mouth daily. Take 30-11min before first meal of the day. 30 tablet 5  . potassium chloride SA (KLOR-CON) 20 MEQ tablet Take 1 tablet (20 mEq total) by mouth daily. 90  tablet 3  . rosuvastatin (CRESTOR) 40 MG tablet Take 1 tablet (40 mg total) by mouth daily. 30 tablet 3  . sertraline (ZOLOFT) 25 MG tablet Take 1 tablet (25 mg total) by mouth daily. For anxiety/panic attacks 30 tablet 5  . torsemide (DEMADEX) 20 MG tablet Take 3 tablets (60 mg total) by mouth daily. 30 tablet 1  . Vitamin D, Ergocalciferol, (DRISDOL) 1.25 MG (50000 UNIT) CAPS capsule Take 1 capsule (50,000 Units total) by mouth every 7 (seven) days. 8 capsule 0  . loratadine (CLARITIN) 10 MG tablet Take 1 tablet (10 mg total) by mouth daily. (Patient taking differently: Take 10 mg by mouth daily as needed for allergies. ) 30 tablet 2  . spironolactone (ALDACTONE) 25 MG tablet Take 0.5 tablets (12.5 mg total) by mouth daily. 45 tablet 3   No facility-administered medications prior to visit.  Allergies:   Entresto [sacubitril-valsartan] and Other   Social History   Socioeconomic History  . Marital status: Married    Spouse name: Not on file  . Number of children: Not on file  . Years of education: Not on file  . Highest education level: Not on file  Occupational History  . Occupation: Therapist, sports: [employer]  Tobacco Use  . Smoking status: Former Smoker    Packs/day: 0.25    Years: 30.00    Pack years: 7.50    Types: Cigars    Quit date: 05/23/2016    Years since quitting: 4.0  . Smokeless tobacco: Never Used  . Tobacco comment: since early 27s  Vaping Use  . Vaping Use: Never used  Substance and Sexual Activity  . Alcohol use: Yes    Alcohol/week: 0.0 standard drinks    Comment: occ  . Drug use: No  . Sexual activity: Not Currently  Other Topics Concern  . Not on file  Social History Narrative  . Not on file   Social Determinants of Health   Financial Resource Strain:   . Difficulty of Paying Living Expenses:   Food Insecurity:   . Worried About Charity fundraiser in the Last Year:   . Arboriculturist in the Last Year:   Transportation  Needs:   . Film/video editor (Medical):   [name] Lack of Transportation (Non-Medical):   Physical Activity:   . Days of Exercise per Week:   . Minutes of Exercise per Session:   Stress:   . Feeling of Stress :   Social Connections:   . Frequency of Communication with Friends and Family:   . Frequency of Social Gatherings with Friends and Family:   . Attends Religious Services:   . Active Member of Clubs or Organizations:   . Attends Archivist Meetings:   [name] Marital Status:      Family History:  The patient's family history includes Asthma in his sister; COPD in his father and mother; Diabetes in his mother; Emphysema in his father and mother; Heart attack in his mother; Heart disease in his mother; Hypertension in his mother; Stroke in his sister.   Review of Systems:   Please see the history of present illness.     General:  No chills, fever, night sweats or weight changes.  Cardiovascular:  No chest pain, edema, orthopnea, palpitations, paroxysmal nocturnal dyspnea. Positive for dyspnea on exertion (improved).  Dermatological: No rash, lesions/masses Respiratory: No cough, dyspnea Urologic: No hematuria, dysuria Abdominal:   No nausea, vomiting, diarrhea, bright red blood per rectum, melena, or hematemesis Neurologic:  No visual changes, wkns, changes in mental status. All other systems reviewed and are otherwise negative except as noted above.   Physical Exam:    VS:  BP 112/60   Pulse 84   Ht 5\' 10"  (1.778 m)   Wt 211 lb (95.7 kg)   SpO2 96%   BMI 30.28 kg/m    General: Well developed, pleasant male appearing in no acute distress. Head: Normocephalic, atraumatic, sclera non-icteric.  Neck: No carotid bruits. JVD not elevated.  Lungs: Respirations regular and unlabored, without wheezes or rales.  Heart: Regular rate and rhythm. No S3 or S4.  No murmur, no rubs, or gallops appreciated. Abdomen: Soft, non-tender, non-distended. No obvious abdominal  masses. Msk:  Strength and tone appear normal for age. No obvious joint deformities or effusions. Extremities: No clubbing or cyanosis. No  lower extremity edema. Wearing compression stockings.  Distal pedal pulses are 2+ bilaterally. Neuro: Alert and oriented X 3. Moves all extremities spontaneously. No focal deficits noted. Psych:  Responds to questions appropriately with a normal affect. Skin: No rashes or lesions noted  Wt Readings from Last 3 Encounters:  06/25/20 211 lb (95.7 kg)  06/10/20 209 lb 7 oz (95 kg)  05/28/20 214 lb 11.2 oz (97.4 kg)     Studies/Labs Reviewed:   EKG:  EKG is not ordered today.   Recent Labs: 06/09/2020: Hemoglobin 15.1; Platelets 273; TSH 4.728 06/10/2020: ALT 29; B Natriuretic Peptide 509.0; BUN 27; Creatinine, Ser 1.25; Magnesium 2.2; Potassium 3.9; Sodium 139   Lipid Panel    Component Value Date/Time   CHOL 242 (H) 06/09/2020 0744   TRIG 208 (H) 06/09/2020 0744   HDL 51 06/09/2020 0744   CHOLHDL 4.7 06/09/2020 0744   VLDL 42 (H) 06/09/2020 0744   LDLCALC 149 (H) 06/09/2020 0744   LDLCALC 82 10/23/2019 0955   LDLDIRECT TEST NOT PERFORMED 06/19/2011 1230    Additional studies/ records that were reviewed today include:   Cardiac Catheterization: 05/2016  Patent stent in the mid circumflex.  Moderate disease in the RCA and LAD.  Mild pulmonary hypertension.  Cardiac output 5.1 L/m. Cardiac index 2.4  Pulmonary artery saturation 66%.   Cardiomyopathy not related to obstructive coronary artery disease. The patient did state that he drinks alcohol heavily. I did explain him the cutting back on his alcohol may help his cardiac function. Follow-up with Dr. Harl Bowie.  Echocardiogram: 04/2020 IMPRESSIONS    1. Left ventricular ejection fraction, by estimation, is 20%. The left  ventricle has severely decreased function. The left ventricle demonstrates  regional wall motion abnormalities (see scoring diagram/findings for  description). The  left ventricular  internal cavity size was mildly dilated. There is mild left ventricular  hypertrophy. Left ventricular diastolic parameters are consistent with  Grade II diastolic dysfunction (pseudonormalization).  2. Right ventricular systolic function is normal. The right ventricular  size is normal. There is normal pulmonary artery systolic pressure. The  estimated right ventricular systolic pressure is 97.3 mmHg.  3. Left atrial size was upper normal.  4. The mitral valve is grossly normal. Mild mitral valve regurgitation.  5. The aortic valve is tricuspid. Aortic valve regurgitation is trivial.  6. The inferior vena cava is dilated in size with >50% respiratory  variability, suggesting right atrial pressure of 8 mmHg.    Assessment:    1. Chronic combined systolic and diastolic heart failure ([place])   2. Coronary artery disease involving native coronary artery of native heart without angina pectoris   3. Medication management   4. Mitral valve insufficiency, unspecified etiology   5. Essential hypertension   6. OSA (obstructive sleep apnea)    7. Stage 3 chronic kidney disease, unspecified whether stage 3a or 3b CKD      Plan:   In order of problems listed above:  1. Chronic Combined Systolic and Diastolic CHF - He has a known reduced EF of 20% by most recent echocardiogram in 04/2020 as outlined above.  EF was previously 30 to 35% in 2017 (cardiomyopathy not felt to be related to his CAD at that time and possibly alcohol induced) and he did undergo ICD placement in 2019. - He reports his respiratory status has been stable since his most recent admission and weight has also been stable on his home scales. Weight is up 2 pounds on the office  scales as compared to his discharge weight but he appears euvolemic on examination.  - Continue Torsemide at current dosing of 60mg  daily. He typically takes this in the afternoon/evening hours due to his work schedule and also due to  his episodes of flash pulmonary edema at night.  Reviewed that he could divide this out and take 20mg  in AM/40mg  in PM or 40mg  in AM/20mg  in PM if he started to develop symptoms during the daytime. He is currently on Losartan 25 mg daily, Toprol-XL 50 mg daily and Spironolactone 12.5 mg daily. Will further titrate Spironolactone to 25 mg daily and recheck a BMET in 2 weeks. Previously intolerant to Entresto due to coughing and dizziness. He does have a previously scheduled visit with [facility] next month given his multiple admissions over the past 6+ months and was encouraged to keep this appointment.   2. CAD - He is s/p STEMI in 2013 with DES to LCx, patent by repeat cath in 05/2016 with moderate residual CAD along RCA and LAD. He denies any recent chest pain and his breathing has been at baseline since hospital discharge. Continue current medication regimen with ASA 81 mg daily, Toprol-XL 50 mg daily and Crestor 40 mg daily.  3. Mitral Regurgitation -This was mild by most recent echocardiogram in 04/2020.  4. HTN - BP is well controlled at 112/60 during today's visit. Will plan to titrate Spironolactone as outlined above and I did encourage him to follow his readings at home with this adjustment.    5. OSA - Continued compliance with CPAP encouraged.   6. Stage 3 CKD - Baseline creatinine 1.3 - 1.4. Peaked at 1.72 during his recent admission, improved to 1.25 at the time of discharge. Will recheck a BMET in 2 weeks to reassess renal function and electrolytes.      Medication Adjustments/Labs and Tests Ordered: Current medicines are reviewed at length with the patient today.  Concerns regarding medicines are outlined above.  Medication changes, Labs and Tests ordered today are listed in the Patient Instructions below. Patient Instructions  Medication Instructions:  Your physician has recommended you make the following change in your medication:   Increase Spironolactone to 25 mg Daily    *If you need a refill on your cardiac medications before your next appointment, please call your pharmacy*   Lab Work: Your physician recommends that you return for lab work in: 2 Weeks   If you have labs (blood work) drawn today and your tests are completely normal, you will receive your results only by: [name] MyChart Message (if you have MyChart) OR . A paper copy in the mail If you have any lab test that is abnormal or we need to change your treatment, we will call you to review the results.   Testing/Procedures: NONE    Follow-Up: At [facility], you and your health needs are our priority.  As part of our continuing mission to provide you with exceptional heart care, we have created designated Provider Care Teams.  These Care Teams include your primary Cardiologist (physician) and Advanced Practice Providers (APPs -  Physician Assistants and Nurse Practitioners) who all work together to provide you with the care you need, when you need it.  We recommend signing up for the patient portal called "MyChart".  Sign up information is provided on this After Visit Summary.  MyChart is used to connect with patients for Virtual Visits (Telemedicine).  Patients are able to view lab/test results, encounter notes, upcoming appointments,  etc.  Non-urgent messages can be sent to your provider as well.   To learn more about what you can do with MyChart, go to [email or web address].    Your next appointment:   3 month(s)  The format for your next appointment:   In Person  Provider:   [name], MD   Other Instructions Thank you for choosing [facility]!    Signed, Erma Heritage, PA-C  06/25/2020 6:00 PM    [facility] [address] [place], [place] [address] Phone: [phone] Fax: [phone]

## 2020-06-25 NOTE — Patient Instructions (Signed)
Medication Instructions:  Your physician has recommended you make the following change in your medication:   Increase Spironolactone to 25 mg Daily   *If you need a refill on your cardiac medications before your next appointment, please call your pharmacy*   Lab Work: Your physician recommends that you return for lab work in: 2 Weeks   If you have labs (blood work) drawn today and your tests are completely normal, you will receive your results only by: [name] MyChart Message (if you have MyChart) OR . A paper copy in the mail If you have any lab test that is abnormal or we need to change your treatment, we will call you to review the results.   Testing/Procedures: NONE    Follow-Up: At [facility], you and your health needs are our priority.  As part of our continuing mission to provide you with exceptional heart care, we have created designated Provider Care Teams.  These Care Teams include your primary Cardiologist (physician) and Advanced Practice Providers (APPs -  Physician Assistants and Nurse Practitioners) who all work together to provide you with the care you need, when you need it.  We recommend signing up for the patient portal called "MyChart".  Sign up information is provided on this After Visit Summary.  MyChart is used to connect with patients for Virtual Visits (Telemedicine).  Patients are able to view lab/test results, encounter notes, upcoming appointments, etc.  Non-urgent messages can be sent to your provider as well.   To learn more about what you can do with MyChart, go to [email or web address].    Your next appointment:   3 month(s)  The format for your next appointment:   In Person  Provider:   [name], MD   Other Instructions Thank you for choosing [facility]!

## 2020-07-02 ENCOUNTER — Other Ambulatory Visit: Payer: Self-pay

## 2020-07-02 ENCOUNTER — Telehealth: Payer: Self-pay

## 2020-07-02 ENCOUNTER — Encounter: Payer: Self-pay | Admitting: "Endocrinology

## 2020-07-02 ENCOUNTER — Ambulatory Visit (INDEPENDENT_AMBULATORY_CARE_PROVIDER_SITE_OTHER): Payer: BC Managed Care – PPO | Admitting: "Endocrinology

## 2020-07-02 VITALS — BP 104/67 | HR 87 | Ht 70.0 in | Wt 211.0 lb

## 2020-07-02 DIAGNOSIS — E1159 Type 2 diabetes mellitus with other circulatory complications: Secondary | ICD-10-CM

## 2020-07-02 DIAGNOSIS — E782 Mixed hyperlipidemia: Secondary | ICD-10-CM

## 2020-07-02 MED ORDER — GLIPIZIDE ER 5 MG PO TB24
5.0000 mg | ORAL_TABLET | Freq: Every day | ORAL | 3 refills | Status: DC
Start: 2020-07-02 — End: 2020-09-25

## 2020-07-02 MED ORDER — LANTUS SOLOSTAR 100 UNIT/ML ~~LOC~~ SOPN: 60.0000 [IU] | PEN_INJECTOR | Freq: Every day | SUBCUTANEOUS | 2 refills | Status: DC

## 2020-07-02 NOTE — Telephone Encounter (Signed)
Pt is calling his hand is flared back up , can you call him in gout medication

## 2020-07-02 NOTE — Patient Instructions (Signed)

## 2020-07-02 NOTE — Progress Notes (Signed)
07/02/2020, 7:29 PM                  Endocrinology follow-up note  Subjective:    Patient ID: Antonio Cobb, male    DOB: 23-May-1961.  he is being seen in follow-up for his chronically uncontrolled type 2 diabetes, hyperlipidemia, hypertension.   PMD :  [name], MD.   Past Medical History:  Diagnosis Date  . Acute CHF (congestive heart failure) ([place]) 01/21/2020  . Acute on chronic combined systolic and diastolic CHF (congestive heart failure) ([place])   . Acute respiratory failure with hypoxia and hypercapnia (HCC)   . Acute systolic CHF (congestive heart failure) ([place]) 05/08/2020  . Alcohol abuse   . CAD (coronary artery disease)    a. s/p inferolateral STEMI in 05/2012 with DES to LCx b. patent stent by cath in 05/2016 with moderate RCA and LAD disease  . Cardiomyopathy ([place])    a. LVEF 30-35% by echo in 09/2016. b. 08/2017: echo showing EF remains reduced at 25-30% c. s/p [employer] ICD placement in 01/2018  . CHF (congestive heart failure) ([place])   . CHF exacerbation ([place]) 11/18/2019  . COPD (chronic obstructive pulmonary disease) ([place])   . Diabetes mellitus, type 2 ([place])   . Essential hypertension   . Hypertensive urgency   . Mitral regurgitation    Moderate  . Mixed hyperlipidemia   . Myocardial infarction ([place]) 05/23/2012   Inferolateral STEMI  . Obesity    Past Surgical History:  Procedure Laterality Date  . CARDIAC CATHETERIZATION  2 yrs ago  . CARDIAC CATHETERIZATION N/A 05/15/2016   Procedure: Right/Left Heart Cath and Coronary Angiography;  Surgeon: [name], MD;  Location: [place] [facility];  Service: Cardiovascular;  Laterality: N/A;  . CORONARY STENT PLACEMENT  05/23/12  . ICD IMPLANT N/A 02/01/2018   Procedure: ICD IMPLANT;  Surgeon: Evans Lance, MD;  Location: [place] [facility];  Service: Cardiovascular;  Laterality: N/A;  . LEFT HEART  CATHETERIZATION WITH CORONARY ANGIOGRAM N/A 05/23/2012   Procedure: LEFT HEART CATHETERIZATION WITH CORONARY ANGIOGRAM;  Surgeon: [name], MD;  Location: [facility] CATH LAB;  Service: Cardiovascular;  Laterality: N/A;  . None    . PERCUTANEOUS CORONARY STENT INTERVENTION (PCI-S) N/A 05/23/2012   Procedure: PERCUTANEOUS CORONARY STENT INTERVENTION (PCI-S);  Surgeon: [name], MD;  Location: [facility] C[facility] LAB;  Service: Cardiovascular;  Laterality: N/A;  . POLYPECTOMY  10/16/2011   Procedure: POLYPECTOMY;  Surgeon: [name], MD;  Location: [facility];  Service: Endoscopy;;  Polypoid Lesion, Transverse and Sigmoid Colon   Social History   Socioeconomic History  . Marital status: Married    Spouse name: Not on file  . Number of children: Not on file  . Years of education: Not on file  . Highest education level: Not on file  Occupational History  . Occupation: Therapist, sports: [employer]  Tobacco Use  . Smoking status: Former Smoker    Packs/day: 0.25    Years: 30.00    Pack years: 7.50    Types: Cigars  Quit date: 05/23/2016    Years since quitting: 4.1  . Smokeless tobacco: Never Used  . Tobacco comment: since early 60s  Vaping Use  . Vaping Use: Never used  Substance and Sexual Activity  . Alcohol use: Yes    Alcohol/week: 0.0 standard drinks    Comment: occ  . Drug use: No  . Sexual activity: Not Currently  Other Topics Concern  . Not on file  Social History Narrative  . Not on file   Social Determinants of Health   Financial Resource Strain:   . Difficulty of Paying Living Expenses:   Food Insecurity:   . Worried About Charity fundraiser in the Last Year:   . Arboriculturist in the Last Year:   Transportation Needs:   . Film/video editor (Medical):   [name] Lack of Transportation (Non-Medical):   Physical Activity:   . Days of Exercise per Week:   . Minutes of Exercise per Session:   Stress:   . Feeling of Stress :   Social Connections:   .  Frequency of Communication with Friends and Family:   . Frequency of Social Gatherings with Friends and Family:   . Attends Religious Services:   . Active Member of Clubs or Organizations:   . Attends Archivist Meetings:   [name] Marital Status:    Outpatient Encounter Medications as of 07/02/2020  Medication Sig  . allopurinol (ZYLOPRIM) 300 MG tablet Take 1 tablet (300 mg total) by mouth daily.  [name] aspirin EC 81 MG EC tablet Take 1 tablet (81 mg total) by mouth daily with breakfast.  . docusate sodium (COLACE) 100 MG capsule Take 100 mg by mouth daily.  . famotidine (PEPCID) 20 MG tablet Take 1 tablet (20 mg total) by mouth at bedtime.  [name] glipiZIDE (GLUCOTROL XL) 5 MG 24 hr tablet Take 1 tablet (5 mg total) by mouth daily with breakfast.  . hydrOXYzine (ATARAX/VISTARIL) 25 MG tablet Take one tablet by mouth at bedtime, for anxiety and sleep (Patient taking differently: Take 25 mg by mouth at bedtime. Take one tablet by mouth at bedtime, for anxiety and sleep)  . insulin aspart (NOVOLOG) 100 UNIT/ML FlexPen Inject 10-16 Units into the skin 3 (three) times daily with meals.  . insulin glargine (LANTUS SOLOSTAR) 100 UNIT/ML Solostar Pen Inject 60 Units into the skin at bedtime.  [name] losartan (COZAAR) 25 MG tablet Take 1 tablet (25 mg total) by mouth daily.  . Melatonin 10 MG TABS Take 2 tablets by mouth at bedtime as needed (for sleep).   . metoprolol succinate (TOPROL-XL) 50 MG 24 hr tablet TAKE 1 TABLET BY MOUTH EVERY DAY WITH FOOD (Patient taking differently: Take 50 mg by mouth daily. TAKE 1 TABLET BY MOUTH EVERY DAY WITH FOOD)  . Multiple Vitamins-Minerals (MULTIVITAMIN WITH MINERALS) tablet Take 1 tablet by mouth daily.  . pantoprazole (PROTONIX) 40 MG tablet Take 1 tablet (40 mg total) by mouth daily. Take 30-68min before first meal of the day.  . potassium chloride SA (KLOR-CON) 20 MEQ tablet Take 1 tablet (20 mEq total) by mouth daily.  . rosuvastatin (CRESTOR) 40 MG tablet Take 1  tablet (40 mg total) by mouth daily.  . sertraline (ZOLOFT) 25 MG tablet Take 1 tablet (25 mg total) by mouth daily. For anxiety/panic attacks  . spironolactone (ALDACTONE) 25 MG tablet Take 1 tablet (25 mg total) by mouth daily.  [name] torsemide (DEMADEX) 20 MG tablet Take 3 tablets (60 mg total)  by mouth daily.  . Vitamin D, Ergocalciferol, (DRISDOL) 1.25 MG (50000 UNIT) CAPS capsule Take 1 capsule (50,000 Units total) by mouth every 7 (seven) days.  . [DISCONTINUED] insulin glargine (LANTUS SOLOSTAR) 100 UNIT/ML Solostar Pen Inject 50 Units into the skin at bedtime.   No facility-administered encounter medications on file as of 07/02/2020.    ALLERGIES: Allergies  Allergen Reactions  . Entresto [Sacubitril-Valsartan]     Dizziness, Coughing  . Other     Seasonal allergies  - has to use inhaler    VACCINATION STATUS: Immunization History  Administered Date(s) Administered  . Influenza Split 11/11/2011, 09/07/2012  . Influenza,inj,Quad PF,6+ Mos 03/12/2016, 10/08/2016, 09/14/2017, 01/30/2019, 01/30/2019, 10/11/2019  . Moderna SARS-COVID-2 Vaccination 02/11/2020, 03/16/2020  . Pneumococcal Polysaccharide-23 08/13/2011, 10/08/2016  . Td 07/01/2004  . Tdap 07/05/2014    Diabetes He presents for his follow-up diabetic visit. He has type 2 diabetes mellitus. Onset time: She was diagnosed at approximate age of 67 years. His disease course has been worsening (Patient was seen prior to 3 years ago in this clinic, no showed for unclear reasons. He did have a history of noncompliance/nonadherence.). There are no hypoglycemic associated symptoms. Pertinent negatives for hypoglycemia include no confusion, pallor or seizures. Associated symptoms include polydipsia and polyuria. Pertinent negatives for diabetes include no fatigue, no polyphagia and no weakness. There are no hypoglycemic complications. Symptoms are worsening. Diabetic complications include heart disease and impotence. Risk factors for  coronary artery disease include diabetes mellitus, dyslipidemia, hypertension, male sex, sedentary lifestyle, tobacco exposure, family history and obesity. He is compliant with treatment none of the time. He is following a generally unhealthy diet. When asked about meal planning, he reported none. He has not had a previous visit with a dietitian. He never participates in exercise. His home blood glucose trend is increasing steadily. His overall blood glucose range is >200 mg/dl. (This patient is known to have alarming noncompliance to follow-ups and treatment regimens/recommendations.  He presents with a meter showing 1-2 times per day readings averaging between 239-319.  His recent A1c was 10.9%, reappearing after long absence from clinic.    He is accompanied by his wife.) An ACE inhibitor/angiotensin II receptor blocker is being taken. He does not see a podiatrist.Eye exam is current.  Hyperlipidemia This is a chronic problem. The current episode started more than 1 year ago. Exacerbating diseases include diabetes. Pertinent negatives include no myalgias. Current antihyperlipidemic treatment includes statins. Risk factors for coronary artery disease include diabetes mellitus, dyslipidemia, hypertension and a sedentary lifestyle.     Objective:    BP 104/67   Pulse 87   Ht 5\' 10"  (1.778 m)   Wt 211 lb (95.7 kg)   BMI 30.28 kg/m   Wt Readings from Last 3 Encounters:  07/02/20 211 lb (95.7 kg)  06/25/20 211 lb (95.7 kg)  06/10/20 209 lb 7 oz (95 kg)     CMP ( most recent) CMP     Component Value Date/Time   NA 139 06/10/2020 0347   K 3.9 06/10/2020 0347   CL 99 06/10/2020 0347   CO2 27 06/10/2020 0347   GLUCOSE 203 (H) 06/10/2020 0347   BUN 27 (H) 06/10/2020 0347   CREATININE 1.25 (H) 06/10/2020 0347   CREATININE 1.51 (H) 05/28/2020 1030   CALCIUM 8.8 (L) 06/10/2020 0347   PROT 7.0 06/10/2020 0347   ALBUMIN 3.5 06/10/2020 0347   AST 22 06/10/2020 0347   ALT 29 06/10/2020 0347    ALKPHOS 75 06/10/2020  0347   BILITOT 1.1 06/10/2020 0347   GFRNONAA >60 06/10/2020 0347   GFRNONAA 50 (L) 05/28/2020 1030   GFRAA >60 06/10/2020 0347   GFRAA 58 (L) 05/28/2020 1030     Diabetic Labs (most recent): Lab Results  Component Value Date   HGBA1C 10.2 (H) 05/08/2020   HGBA1C 9.5 (H) 01/22/2020   HGBA1C 9.7 (H) 01/21/2020   Lipid Panel     Component Value Date/Time   CHOL 242 (H) 06/09/2020 0744   TRIG 208 (H) 06/09/2020 0744   HDL 51 06/09/2020 0744   CHOLHDL 4.7 06/09/2020 0744   VLDL 42 (H) 06/09/2020 0744   LDLCALC 149 (H) 06/09/2020 0744   LDLCALC 82 10/23/2019 0955   LDLDIRECT TEST NOT PERFORMED 06/19/2011 1230      Lab Results  Component Value Date   TSH 4.728 (H) 06/09/2020   TSH 1.333 11/13/2019   TSH 1.64 01/24/2019   TSH 1.38 03/12/2016   TSH 1.789 12/30/2012   TSH 2.849 11/11/2011   TSH 1.597 12/11/2009      Assessment & Plan:   1. DM type 2 causing vascular disease ([place])  - Patient has currently uncontrolled symptomatic type 2 DM since  59 years of age.  This patient is known to have alarming noncompliance to follow-ups and treatment regimens/recommendations.  He presents with a meter showing 1-2 times per day readings averaging between 239-319.  His recent A1c was 10.9%, reappearing after long absence from clinic.    He is accompanied by his wife.    He claims that he has not been using his Lantus.  He is not monitoring blood glucose regularly.   He is accompanied by his wife who is offering to help him manage his diabetes.  Recent labs reviewed.  -his diabetes is complicated by coronary artery disease, noncompliance/nonadherence, and Hosam A Falkner remains at a high risk for more acute and chronic complications which include CAD, CVA, CKD, retinopathy, and neuropathy. These are all discussed in detail with the patient.  -I had a long discussion with him and his wife about complications of uncontrolled diabetes and individualized  plan for him to use to control diabetes to target. - I have counseled him on diet management  by adopting a carbohydrate restricted/protein rich diet.  - he  admits there is a room for improvement in his diet and drink choices. -  Suggestion is made for him to avoid simple carbohydrates  from his diet including Cakes, Sweet Desserts / Pastries, Ice Cream, Soda (diet and regular), Sweet Tea, Candies, Chips, Cookies, Sweet Pastries,  Store Bought Juices, Alcohol in Excess of  1-2 drinks a day, Artificial Sweeteners, Coffee Creamer, and "Sugar-free" Products. This will help patient to have stable blood glucose profile and potentially avoid unintended weight gain.   - I encouraged him to switch to  unprocessed or minimally processed complex starch and increased protein intake (animal or plant source), fruits, and vegetables.  -He does not monitor blood glucose regularly, reports random glycemic profile greater than 200 mg per DL on average.  - he is advised to stick to a routine mealtimes to eat 3 meals  a day and avoid unnecessary snacks ( to snack only to correct hypoglycemia).   - I have approached him with the following individualized plan to manage diabetes and patient agrees:    -He is approached again for basal/bolus insulin.  He is advised to increase his Lantus to 60 units nightly, continue NovoLog  10 units 3 times  a day with meals  for pre-meal BG readings of 90-150mg /dl, plus patient specific correction dose for unexpected hyperglycemia above 150mg /dl, associated with strict monitoring of glucose 4 times a day-before meals and at bedtime.  - Patient is warned not to take insulin without proper monitoring per orders. -Adjustment parameters are given for hypo and hyperglycemia in writing. -Patient is encouraged to call clinic for blood glucose levels less than 70 or above 300 mg /dl. -  He does not tolerate metformin.  He may benefit from low-dose glipizide.  I discussed and added  glipizide 5 mg XL p.o. daily at breakfast. He will benefit from a CGM device.  Will be considered for the freestyle libre device on next visit. -Patient is not a candidate for  SGLT2 SGLT2 inhibitors due to CKD.  2) Lipids/HPL:   His most recent LDL was uncontrolled at 149, increasing from 76.  He is advised to resume and continue Crestor 40 mg p.o. nightly.   Side effects and precautions discussed with him.     3) hypertension: His blood pressure is controlled to target.  He is advised to continue losartan 25 mg daily, spironolactone 25 mg p.o. daily.  He also has torsemide as needed.    - I advised patient to maintain close follow up with [name], MD for primary care needs.  - Time spent on this patient care encounter:  35 min, of which > 50% was spent in  counseling and the rest reviewing his blood glucose logs , discussing his hypoglycemia and hyperglycemia episodes, reviewing his current and  previous labs / studies  ( including abstraction from other facilities) and medications  doses and developing a  long term treatment plan and documenting his care.   Please refer to Patient Instructions for Blood Glucose Monitoring and Insulin/Medications Dosing Guide"  in media tab for additional information. Please  also refer to " Patient Self Inventory" in the Media  tab for reviewed elements of pertinent patient history.  Edwena Bunde participated in the discussions, expressed understanding, and voiced agreement with the above plans.  All questions were answered to his satisfaction. he is encouraged to contact clinic should he have any questions or concerns prior to his return visit.   Follow up plan: - Return in about 2 weeks (around 07/16/2020) for F/U with Meter and Logs Only - no Labs.  Glade Lloyd, MD [facility] [address] [place], [place] [address] Phone: [phone]  Fax: [phone]    07/02/2020, 7:29  PM  This note was partially dictated with voice recognition software. Similar sounding words can be transcribed inadequately or may not  be corrected upon review.

## 2020-07-03 ENCOUNTER — Other Ambulatory Visit: Payer: Self-pay | Admitting: Family Medicine

## 2020-07-03 MED ORDER — PREDNISONE 10 MG PO TABS
10.0000 mg | ORAL_TABLET | Freq: Two times a day (BID) | ORAL | 0 refills | Status: DC
Start: 2020-07-03 — End: 2020-07-22

## 2020-07-03 NOTE — Telephone Encounter (Signed)
Pt was seen recently with same problem and left hand has flared back up with gout and wants to be treated again

## 2020-07-03 NOTE — Telephone Encounter (Signed)
Short course prednisone is prescribed. PLEASE let him know he should be taking the allopurinol prescribed starting next week Friday to lower the uric acid and reduce rept flares. If he necver collected this please re send, thanks

## 2020-07-04 NOTE — Telephone Encounter (Signed)
Left voicemail for patient with recommendations

## 2020-07-09 ENCOUNTER — Other Ambulatory Visit: Payer: Self-pay

## 2020-07-09 MED ORDER — TORSEMIDE 20 MG PO TABS: 60.0000 mg | ORAL_TABLET | Freq: Every day | ORAL | 6 refills | Status: DC

## 2020-07-09 NOTE — Telephone Encounter (Signed)
Refilled torsemide to [facility]

## 2020-07-10 ENCOUNTER — Other Ambulatory Visit: Payer: Self-pay | Admitting: Cardiology

## 2020-07-10 DIAGNOSIS — G4733 Obstructive sleep apnea (adult) (pediatric): Secondary | ICD-10-CM | POA: Diagnosis not present

## 2020-07-11 ENCOUNTER — Emergency Department (HOSPITAL_COMMUNITY): Payer: BC Managed Care – PPO

## 2020-07-11 ENCOUNTER — Other Ambulatory Visit: Payer: Self-pay

## 2020-07-11 ENCOUNTER — Encounter (HOSPITAL_COMMUNITY): Payer: Self-pay | Admitting: Emergency Medicine

## 2020-07-11 ENCOUNTER — Observation Stay (HOSPITAL_COMMUNITY)
Admission: EM | Admit: 2020-07-11 | Discharge: 2020-07-12 | Disposition: A | Discharge status: Home / Self Care | Payer: BC Managed Care – PPO | Attending: Family Medicine | Admitting: Family Medicine

## 2020-07-11 DIAGNOSIS — I5043 Acute on chronic combined systolic (congestive) and diastolic (congestive) heart failure: Secondary | ICD-10-CM | POA: Diagnosis not present

## 2020-07-11 DIAGNOSIS — I110 Hypertensive heart disease with heart failure: Secondary | ICD-10-CM | POA: Diagnosis not present

## 2020-07-11 DIAGNOSIS — I25721 Atherosclerosis of autologous artery coronary artery bypass graft(s) with angina pectoris with documented spasm: Secondary | ICD-10-CM | POA: Diagnosis not present

## 2020-07-11 DIAGNOSIS — E119 Type 2 diabetes mellitus without complications: Secondary | ICD-10-CM | POA: Diagnosis not present

## 2020-07-11 DIAGNOSIS — Z7982 Long term (current) use of aspirin: Secondary | ICD-10-CM | POA: Insufficient documentation

## 2020-07-11 DIAGNOSIS — Z20822 Contact with and (suspected) exposure to covid-19: Secondary | ICD-10-CM | POA: Insufficient documentation

## 2020-07-11 DIAGNOSIS — Z794 Long term (current) use of insulin: Secondary | ICD-10-CM | POA: Insufficient documentation

## 2020-07-11 DIAGNOSIS — R0689 Other abnormalities of breathing: Secondary | ICD-10-CM | POA: Diagnosis not present

## 2020-07-11 DIAGNOSIS — Z79899 Other long term (current) drug therapy: Secondary | ICD-10-CM | POA: Diagnosis not present

## 2020-07-11 DIAGNOSIS — R0602 Shortness of breath: Secondary | ICD-10-CM | POA: Diagnosis not present

## 2020-07-11 DIAGNOSIS — J449 Chronic obstructive pulmonary disease, unspecified: Secondary | ICD-10-CM | POA: Diagnosis not present

## 2020-07-11 DIAGNOSIS — I5023 Acute on chronic systolic (congestive) heart failure: Secondary | ICD-10-CM | POA: Diagnosis not present

## 2020-07-11 DIAGNOSIS — I509 Heart failure, unspecified: Secondary | ICD-10-CM | POA: Diagnosis not present

## 2020-07-11 DIAGNOSIS — R069 Unspecified abnormalities of breathing: Secondary | ICD-10-CM | POA: Diagnosis not present

## 2020-07-11 DIAGNOSIS — Z87891 Personal history of nicotine dependence: Secondary | ICD-10-CM | POA: Insufficient documentation

## 2020-07-11 DIAGNOSIS — I10 Essential (primary) hypertension: Secondary | ICD-10-CM | POA: Diagnosis not present

## 2020-07-11 LAB — TROPONIN I (HIGH SENSITIVITY)
Troponin I (High Sensitivity): 21 ng/L — ABNORMAL HIGH (ref <18)
Troponin I (High Sensitivity): 23 ng/L — ABNORMAL HIGH (ref <18)

## 2020-07-11 LAB — BLOOD GAS, ARTERIAL
Acid-Base Excess: 1 mmol/L (ref 0.0–2.0)
Bicarbonate: 24.9 mmol/L (ref 20.0–28.0)
FIO2: 40
Mode: POSITIVE
O2 Saturation: 97.5 %
Patient temperature: 37
RATE: 10 resp/min
pCO2 arterial: 45.1 mmHg (ref 32.0–48.0)
pH, Arterial: 7.373 (ref 7.350–7.450)
pO2, Arterial: 109 mmHg — ABNORMAL HIGH (ref 83.0–108.0)

## 2020-07-11 LAB — CBC
HCT: 40.8 % (ref 39.0–52.0)
Hemoglobin: 13.1 g/dL (ref 13.0–17.0)
MCH: 28.3 pg (ref 26.0–34.0)
MCHC: 32.1 g/dL (ref 30.0–36.0)
MCV: 88.1 fL (ref 80.0–100.0)
Platelets: 288 10*3/uL (ref 150–400)
RBC: 4.63 MIL/uL (ref 4.22–5.81)
RDW: 14 % (ref 11.5–15.5)
WBC: 9.3 10*3/uL (ref 4.0–10.5)
nRBC: 0 % (ref 0.0–0.2)

## 2020-07-11 LAB — BASIC METABOLIC PANEL
Anion gap: 12 (ref 5–15)
BUN: 35 mg/dL — ABNORMAL HIGH (ref 6–20)
CO2: 24 mmol/L (ref 22–32)
Calcium: 8.6 mg/dL — ABNORMAL LOW (ref 8.9–10.3)
Chloride: 100 mmol/L (ref 98–111)
Creatinine, Ser: 1.54 mg/dL — ABNORMAL HIGH (ref 0.61–1.24)
GFR calc Af Amer: 56 mL/min — ABNORMAL LOW (ref >60)
GFR calc non Af Amer: 48 mL/min — ABNORMAL LOW (ref >60)
Glucose, Bld: 272 mg/dL — ABNORMAL HIGH (ref 70–99)
Potassium: 3.7 mmol/L (ref 3.5–5.1)
Sodium: 136 mmol/L (ref 135–145)

## 2020-07-11 LAB — TSH: TSH: 3.195 u[IU]/mL (ref 0.350–4.500)

## 2020-07-11 LAB — CBG MONITORING, ED
Glucose-Capillary: 178 mg/dL — ABNORMAL HIGH (ref 70–99)
Glucose-Capillary: 174 mg/dL — ABNORMAL HIGH (ref 70–99)

## 2020-07-11 LAB — BRAIN NATRIURETIC PEPTIDE: B Natriuretic Peptide: 213 pg/mL — ABNORMAL HIGH (ref 0.0–100.0)

## 2020-07-11 LAB — SARS CORONAVIRUS 2 BY RT PCR (HOSPITAL ORDER, PERFORMED IN ~~LOC~~ HOSPITAL LAB): SARS Coronavirus 2: NEGATIVE

## 2020-07-11 LAB — GLUCOSE, CAPILLARY: Glucose-Capillary: 209 mg/dL — ABNORMAL HIGH (ref 70–99)

## 2020-07-11 MED ORDER — SODIUM CHLORIDE 0.9% FLUSH
3.0000 mL | Freq: Once | INTRAVENOUS | Status: AC
  Administered 2020-07-11: 3 mL via INTRAVENOUS

## 2020-07-11 MED ORDER — INSULIN ASPART 100 UNIT/ML ~~LOC~~ SOLN
0.0000 [IU] | Freq: Three times a day (TID) | SUBCUTANEOUS | Status: DC
  Administered 2020-07-11 – 2020-07-12 (×3): 2 [IU] via SUBCUTANEOUS
  Administered 2020-07-12: 3 [IU] via SUBCUTANEOUS
  Filled 2020-07-11 (×2): qty 1

## 2020-07-11 MED ORDER — SODIUM CHLORIDE 0.9 % IV SOLN: 250.0000 mL | INTRAVENOUS | Status: DC | PRN

## 2020-07-11 MED ORDER — ALBUTEROL SULFATE (2.5 MG/3ML) 0.083% IN NEBU: 2.5000 mg | INHALATION_SOLUTION | RESPIRATORY_TRACT | Status: DC | PRN

## 2020-07-11 MED ORDER — DOCUSATE SODIUM 100 MG PO CAPS
100.0000 mg | ORAL_CAPSULE | Freq: Every day | ORAL | Status: DC
  Administered 2020-07-11 – 2020-07-12 (×2): 100 mg via ORAL
  Filled 2020-07-11 (×2): qty 1

## 2020-07-11 MED ORDER — ROSUVASTATIN CALCIUM 20 MG PO TABS
40.0000 mg | ORAL_TABLET | Freq: Every day | ORAL | Status: DC
  Administered 2020-07-11 – 2020-07-12 (×2): 40 mg via ORAL
  Filled 2020-07-11: qty 2
  Filled 2020-07-11 (×2): qty 1

## 2020-07-11 MED ORDER — METOPROLOL SUCCINATE ER 50 MG PO TB24
50.0000 mg | ORAL_TABLET | Freq: Every day | ORAL | Status: DC
  Administered 2020-07-11 – 2020-07-12 (×2): 50 mg via ORAL
  Filled 2020-07-11: qty 1
  Filled 2020-07-11: qty 2

## 2020-07-11 MED ORDER — FUROSEMIDE 10 MG/ML IJ SOLN
40.0000 mg | Freq: Once | INTRAMUSCULAR | Status: AC
  Administered 2020-07-11: 40 mg via INTRAVENOUS
  Filled 2020-07-11: qty 4

## 2020-07-11 MED ORDER — HEPARIN SODIUM (PORCINE) 5000 UNIT/ML IJ SOLN
5000.0000 [IU] | Freq: Three times a day (TID) | INTRAMUSCULAR | Status: DC
  Administered 2020-07-11 – 2020-07-12 (×3): 5000 [IU] via SUBCUTANEOUS
  Filled 2020-07-11 (×4): qty 1

## 2020-07-11 MED ORDER — INSULIN ASPART 100 UNIT/ML ~~LOC~~ SOLN
0.0000 [IU] | Freq: Every day | SUBCUTANEOUS | Status: DC
  Administered 2020-07-11: 2 [IU] via SUBCUTANEOUS

## 2020-07-11 MED ORDER — ALLOPURINOL 300 MG PO TABS
300.0000 mg | ORAL_TABLET | Freq: Every day | ORAL | Status: DC
  Administered 2020-07-12: 300 mg via ORAL
  Filled 2020-07-11 (×3): qty 1

## 2020-07-11 MED ORDER — FUROSEMIDE 10 MG/ML IJ SOLN
40.0000 mg | Freq: Every evening | INTRAMUSCULAR | Status: DC
  Administered 2020-07-11: 40 mg via INTRAVENOUS
  Filled 2020-07-11: qty 4

## 2020-07-11 MED ORDER — MELATONIN 3 MG PO TABS
9.0000 mg | ORAL_TABLET | Freq: Every evening | ORAL | Status: DC | PRN
  Administered 2020-07-11: 9 mg via ORAL
  Filled 2020-07-11: qty 3

## 2020-07-11 MED ORDER — PANTOPRAZOLE SODIUM 40 MG PO TBEC
40.0000 mg | DELAYED_RELEASE_TABLET | Freq: Every day | ORAL | Status: DC
  Administered 2020-07-11 – 2020-07-12 (×2): 40 mg via ORAL
  Filled 2020-07-11 (×2): qty 1

## 2020-07-11 MED ORDER — VITAMIN D (ERGOCALCIFEROL) 1.25 MG (50000 UNIT) PO CAPS
50000.0000 [IU] | ORAL_CAPSULE | ORAL | Status: DC
  Administered 2020-07-11: 50000 [IU] via ORAL
  Filled 2020-07-11: qty 1

## 2020-07-11 MED ORDER — SODIUM CHLORIDE 0.9% FLUSH
3.0000 mL | Freq: Two times a day (BID) | INTRAVENOUS | Status: DC
  Administered 2020-07-11 – 2020-07-12 (×2): 3 mL via INTRAVENOUS

## 2020-07-11 MED ORDER — ASPIRIN EC 81 MG PO TBEC
81.0000 mg | DELAYED_RELEASE_TABLET | Freq: Every day | ORAL | Status: DC
  Administered 2020-07-12: 81 mg via ORAL
  Filled 2020-07-11: qty 1

## 2020-07-11 MED ORDER — INSULIN GLARGINE 100 UNIT/ML ~~LOC~~ SOLN
60.0000 [IU] | Freq: Every day | SUBCUTANEOUS | Status: DC
  Administered 2020-07-11: 60 [IU] via SUBCUTANEOUS
  Filled 2020-07-11 (×4): qty 0.6

## 2020-07-11 MED ORDER — ADULT MULTIVITAMIN W/MINERALS CH
1.0000 | ORAL_TABLET | Freq: Every day | ORAL | Status: DC
  Administered 2020-07-11 – 2020-07-12 (×2): 1 via ORAL
  Filled 2020-07-11 (×2): qty 1

## 2020-07-11 MED ORDER — PREDNISONE 10 MG PO TABS
10.0000 mg | ORAL_TABLET | Freq: Two times a day (BID) | ORAL | Status: DC
  Administered 2020-07-11 – 2020-07-12 (×2): 10 mg via ORAL
  Filled 2020-07-11 (×2): qty 1

## 2020-07-11 MED ORDER — FUROSEMIDE 10 MG/ML IJ SOLN
80.0000 mg | Freq: Every day | INTRAMUSCULAR | Status: DC
  Administered 2020-07-11 – 2020-07-12 (×2): 80 mg via INTRAVENOUS
  Filled 2020-07-11 (×2): qty 8

## 2020-07-11 MED ORDER — GLIPIZIDE ER 5 MG PO TB24
5.0000 mg | ORAL_TABLET | Freq: Every day | ORAL | Status: DC
  Administered 2020-07-11: 5 mg via ORAL
  Filled 2020-07-11 (×2): qty 1

## 2020-07-11 MED ORDER — SPIRONOLACTONE 25 MG PO TABS
25.0000 mg | ORAL_TABLET | Freq: Every day | ORAL | Status: DC
  Administered 2020-07-11: 25 mg via ORAL
  Filled 2020-07-11 (×2): qty 1

## 2020-07-11 MED ORDER — POLYETHYLENE GLYCOL 3350 17 G PO PACK: 17.0000 g | PACK | Freq: Every day | ORAL | Status: DC | PRN

## 2020-07-11 MED ORDER — LOSARTAN POTASSIUM 25 MG PO TABS
25.0000 mg | ORAL_TABLET | Freq: Every day | ORAL | Status: DC
  Filled 2020-07-11: qty 1

## 2020-07-11 MED ORDER — SODIUM CHLORIDE 0.9% FLUSH: 3.0000 mL | INTRAVENOUS | Status: DC | PRN

## 2020-07-11 MED ORDER — SERTRALINE HCL 50 MG PO TABS
25.0000 mg | ORAL_TABLET | Freq: Every day | ORAL | Status: DC
  Administered 2020-07-11 – 2020-07-12 (×2): 25 mg via ORAL
  Filled 2020-07-11 (×2): qty 1

## 2020-07-11 MED ORDER — FAMOTIDINE 20 MG PO TABS
20.0000 mg | ORAL_TABLET | Freq: Every day | ORAL | Status: DC
  Administered 2020-07-11: 20 mg via ORAL
  Filled 2020-07-11: qty 1

## 2020-07-11 MED ORDER — POTASSIUM CHLORIDE CRYS ER 20 MEQ PO TBCR
20.0000 meq | EXTENDED_RELEASE_TABLET | Freq: Every day | ORAL | Status: DC
  Administered 2020-07-11 – 2020-07-12 (×2): 20 meq via ORAL
  Filled 2020-07-11 (×2): qty 1

## 2020-07-11 NOTE — ED Triage Notes (Signed)
Pt states he woke up from a dream and felt a rattling in his chest. Ems gave pt 2 nitro en route.

## 2020-07-11 NOTE — Progress Notes (Signed)
Placed on BiPAP 40 14/6 f 10 blood gas drawn.

## 2020-07-11 NOTE — H&P (Signed)
Patient Demographics:    Antonio Cobb, is a 59 y.o. male  MRN: [id number]   DOB - 05/14/1961  Admit Date - 07/11/2020  Outpatient Primary MD for the patient is [name], MD   Assessment & Plan:    Active Problems:   CHF exacerbation (HCC)   Acute exacerbation of CHF (congestive heart failure) (HCC)  1) acute on chronic combined systolic and diastolic CHF exacerbation in a patient with ischemic cardiomyopathy with EF between 25% range--- --Troponin 21 and  23 not consistent with ACS -BNP 213 -Give Lasix 80 mg every morning and 40 mg every afternoon monitor renal function electrolytes closely -hold losartan and Aldactone due to kidney concerns -Daily weight and  fluid input and output balance as ordered  2) acute hypoxic respiratory failure--- secondary to #1 above initially required BiPAP =-Improved after IV diuresis -TSH 3.19 -Weaned off BiPAP, -Manage as above #1  3) CKD stage - IIIa----Renal function appears to be at baseline at this time  --- renally adjust medications, avoid nephrotoxic agents / dehydration  / hypotension -hold losartan and Aldactone due to kidney concerns -Monitor renal function closely with aggressive IV diuresis  4)DM2- A1c is 10.2, reflecting uncontrolled diabetes, give Lantus insulin 60 units nightly use Novolog/Humalog Sliding scale insulin with Accu-Cheks/Fingersticks as ordered -Stop glipizide  5)OSA--on CPAP, compliance advised  6)HTN--hold losartan and Aldactone due to kidney concerns  7)CAD(s/p STEMI in 2013 with DES to LCx, patent by repeat cath in 05/2016 with moderate residual CAD along RCA and LAD)--- -Troponin 21 and  23 not consistent with ACS, continue ASA,  continue Toprol-XL 50 mg daily along with Crestor 40 mg daily.  Disposition/Need for  in-Hospital Stay- patient unable to be discharged at this time due to --- acute respiratory distress requiring BiPAP in the setting of acute CHF exacerbation requiring IV diuresis  Dispo: The patient is from: Home              Anticipated d/c is to: Home              Anticipated d/c date is: 1 day              Patient currently is not medically stable to d/c. Barriers: Not Clinically Stable- -acute respiratory distress requiring BiPAP in the setting of acute CHF exacerbation requiring IV diuresis  With History of - Reviewed by me  Past Medical History:  Diagnosis Date  . Acute CHF (congestive heart failure) ([place]) 01/21/2020  . Acute on chronic combined systolic and diastolic CHF (congestive heart failure) ([place])   . Acute respiratory failure with hypoxia and hypercapnia (HCC)   . Acute systolic CHF (congestive heart failure) ([place]) 05/08/2020  . Alcohol abuse   . CAD (coronary artery disease)    a. s/p inferolateral STEMI in 05/2012 with DES to LCx b. patent stent by cath in 05/2016 with moderate RCA and LAD disease  . Cardiomyopathy ([place])  a. LVEF 30-35% by echo in 09/2016. b. 08/2017: echo showing EF remains reduced at 25-30% c. s/p [employer] ICD placement in 01/2018  . CHF (congestive heart failure) ([place])   . CHF exacerbation ([place]) 11/18/2019  . COPD (chronic obstructive pulmonary disease) ([place])   . Diabetes mellitus, type 2 ([place])   . Essential hypertension   . Hypertensive urgency   . Mitral regurgitation    Moderate  . Mixed hyperlipidemia   . Myocardial infarction ([place]) 05/23/2012   Inferolateral STEMI  . Obesity       Past Surgical History:  Procedure Laterality Date  . CARDIAC CATHETERIZATION  2 yrs ago  . CARDIAC CATHETERIZATION N/A 05/15/2016   Procedure: Right/Left Heart Cath and Coronary Angiography;  Surgeon: [name], MD;  Location: [place] [facility];  Service: Cardiovascular;  Laterality: N/A;  . CORONARY STENT PLACEMENT  05/23/12  . ICD IMPLANT  N/A 02/01/2018   Procedure: ICD IMPLANT;  Surgeon: Evans Lance, MD;  Location: [place] [facility];  Service: Cardiovascular;  Laterality: N/A;  . LEFT HEART CATHETERIZATION WITH CORONARY ANGIOGRAM N/A 05/23/2012   Procedure: LEFT HEART CATHETERIZATION WITH CORONARY ANGIOGRAM;  Surgeon: [name], MD;  [facility] CATH [facility];  Service: Cardiovascular;  Laterality: N/A;  . None    . PERCUTANEOUS CORONARY STENT INTERVENTION (PCI-S) N/A 05/23/2012   Procedure: PERCUTANEOUS CORONARY STENT INTERVENTION (PCI-S);  Surgeon: [name], MD;  Location: [facility] CATH LAB;  Service: Cardiovascular;  Laterality: N/A;  . POLYPECTOMY  10/16/2011   Procedure: POLYPECTOMY;  Surgeon: [name], MD;  Location: [facility];  Service: Endoscopy;;  Polypoid Lesion, Transverse and Sigmoid Colon      Chief Complaint  Patient presents with  . Shortness of Breath      HPI:    Antonio Cobb  is a 59 y.o. male with past medical history relevant for CAD(s/p STEMI in 2013 with DES to LCx, patent by repeat cath in 05/2016 with moderate residual CAD along RCA and LAD), h/o ischemic cardiomyopathy with an EF 20% on last echo done in May 2021 implanted ICD (01/2018) followed by Dr. Lovena Le, combined systolic and diastolic dysfunction CHF hyperlipidemia, COPD, OSA,poorly controlled type 2 diabetes mellitus andhypertensionpresents with severe shortness of breath and hypoxia, failed nasal cannula oxygen required BiPAP initially due to increased work of breathing -Patient states that he felt like he had a bad dream when he woke up he was panicky he felt rattling in his chest equilibrated called EMS EMS given 2 nitroglycerin work-up bradycardia remained high he was placed on BiPAP after failing nasal cannula -In ED chest x-ray consistent with vascular congestion -Troponin 21 and  23 not consistent with ACS -BNP is 213. ---,, CBC WNL --Creatinine 1.5 which is close to patient's previous baseline -ABG on BiPAP  reassuring No fever  Or chills  -No frank chest pains no productive cough no vomiting no diarrhea  Review of systems:    In addition to the HPI above,   A full Review of  Systems was done, all other systems reviewed are negative except as noted above in HPI , .    Social History:  Reviewed by me    Social History   Tobacco Use  . Smoking status: Former Smoker    Packs/day: 0.25    Years: 30.00    Pack years: 7.50    Types: Cigars    Quit date: 05/23/2016    Years since quitting: 4.1  . Smokeless tobacco: Never Used  .  Tobacco comment: since early 95s  Substance Use Topics  . Alcohol use: Yes    Alcohol/week: 0.0 standard drinks    Comment: occ       Family History :  Reviewed by me    Family History  Problem Relation Age of Onset  . Emphysema Mother   . Heart attack Mother   . Hypertension Mother   . Diabetes Mother   . Heart disease Mother   . COPD Mother   . Emphysema Father   . COPD Father   . Stroke Sister   . Asthma Sister   . Colon cancer Neg Hx   . Anesthesia problems Neg Hx   . Hypotension Neg Hx   . Malignant hyperthermia Neg Hx   . Pseudochol deficiency Neg Hx      Home Medications:   Prior to Admission medications   Medication Sig Start Date End Date Taking? Authorizing Provider  allopurinol (ZYLOPRIM) 300 MG tablet Take 1 tablet (300 mg total) by mouth daily. 06/07/20  Yes [name], MD  aspirin EC 81 MG EC tablet Take 1 tablet (81 mg total) by mouth daily with breakfast. 11/20/19  Yes Cyntha Brickman, MD  docusate sodium (COLACE) 100 MG capsule Take 100 mg by mouth daily.   Yes [provider]  famotidine (PEPCID) 20 MG tablet Take 1 tablet (20 mg total) by mouth at bedtime. 01/25/20  Yes Tat, Shanon Brow, MD  glipiZIDE (GLUCOTROL XL) 5 MG 24 hr tablet Take 1 tablet (5 mg total) by mouth daily with breakfast. 07/02/20  Yes Nida, Marella Chimes, MD  insulin glargine (LANTUS SOLOSTAR) 100 UNIT/ML Solostar Pen Inject 60 Units into  the skin at bedtime. 07/02/20  Yes Nida, Marella Chimes, MD  losartan (COZAAR) 25 MG tablet Take 1 tablet (25 mg total) by mouth daily. 03/27/20  Yes Perlie Mayo, NP  Melatonin 10 MG TABS Take 2 tablets by mouth at bedtime as needed (for sleep).    Yes [provider]  metoprolol succinate (TOPROL-XL) 50 MG 24 hr tablet Take 1 tablet (50 mg total) by mouth daily. TAKE 1 TABLET BY MOUTH EVERY DAY WITH FOOD 07/10/20  Yes Branch, Alphonse Guild, MD  Multiple Vitamins-Minerals (MULTIVITAMIN WITH MINERALS) tablet Take 1 tablet by mouth daily. 11/20/19 11/19/20 Yes Arihanna Estabrook, MD  pantoprazole (PROTONIX) 40 MG tablet Take 1 tablet (40 mg total) by mouth daily. Take 30-78min before first meal of the day. 04/01/20  Yes Lauraine Rinne, NP  potassium chloride SA (KLOR-CON) 20 MEQ tablet Take 1 tablet (20 mEq total) by mouth daily. 11/20/19  Yes Roxan Hockey, MD  predniSONE (DELTASONE) 10 MG tablet Take 1 tablet (10 mg total) by mouth 2 (two) times daily with a meal. 07/03/20  Yes [name], MD  rosuvastatin (CRESTOR) 40 MG tablet Take 1 tablet (40 mg total) by mouth daily. 11/20/19  Yes Saga Balthazar, MD  sertraline (ZOLOFT) 25 MG tablet Take 1 tablet (25 mg total) by mouth daily. For anxiety/panic attacks 04/09/20 04/09/21 Yes [name], MD  spironolactone (ALDACTONE) 25 MG tablet Take 1 tablet (25 mg total) by mouth daily. 06/25/20 09/23/20 Yes Strader, Fransisco Hertz, PA-C  torsemide (DEMADEX) 20 MG tablet Take 3 tablets (60 mg total) by mouth daily. 07/09/20  Yes Strader, [place] M, PA-C  Vitamin D, Ergocalciferol, (DRISDOL) 1.25 MG (50000 UNIT) CAPS capsule Take 1 capsule (50,000 Units total) by mouth every 7 (seven) days. 06/10/20 08/09/20 Yes Murlean Iba, MD  Allergies:     Allergies  Allergen Reactions  . Entresto [Sacubitril-Valsartan]     Dizziness, Coughing  . Other     Seasonal allergies  - has to use inhaler     Physical Exam:   Vitals  Blood  pressure (!) 143/99, pulse 74, temperature 98.1 F (36.7 C), temperature source Oral, resp. rate 14, height 5\' 10"  (1.778 m), weight (!) 93 kg, SpO2 99 %.  Physical Examination: General appearance - alert, respiratory distress, conversational dyspnea  mental status - alert, oriented to person, place, and time, anxious Eyes - sclera anicteric Neck - supple, no JVD elevation , Chest -diminished breath sounds, bibasilar rales  heart - S1 and S2 normal, regular  Abdomen - soft, nontender, nondistended, no masses or organomegaly Neurological - screening mental status exam normal, neck supple without rigidity, cranial nerves II through XII intact, DTR's normal and symmetric Extremities - trace pedal edema noted, intact peripheral pulses  Skin - warm, dry     Data Review:    CBC Recent Labs  Lab 07/11/20 0341  WBC 9.3  HGB 13.1  HCT 40.8  PLT 288  MCV 88.1  MCH 28.3  MCHC 32.1  RDW 14.0   ------------------------------------------------------------------------------------------------------------------  Chemistries  Recent Labs  Lab 07/11/20 0341  NA 136  K 3.7  CL 100  CO2 24  GLUCOSE 272*  BUN 35*  CREATININE 1.54*  CALCIUM 8.6*   ------------------------------------------------------------------------------------------------------------------ estimated creatinine clearance is 58.4 mL/min (A) (by C-G formula based on SCr of 1.54 mg/dL (H)). ------------------------------------------------------------------------------------------------------------------ Recent Labs    07/11/20 0341  TSH 3.195     Coagulation profile No results for input(s): INR, PROTIME in the last 168 hours. ------------------------------------------------------------------------------------------------------------------- No results for input(s): DDIMER in the last 72  hours. -------------------------------------------------------------------------------------------------------------------  Cardiac Enzymes No results for input(s): CKMB, TROPONINI, MYOGLOBIN in the last 168 hours.  Invalid input(s): CK ------------------------------------------------------------------------------------------------------------------    Component Value Date/Time   BNP 213.0 (H) 07/11/2020 0341     ---------------------------------------------------------------------------------------------------------------  Urinalysis    Component Value Date/Time   COLORURINE COLORLESS (A) 09/06/2017 0452   APPEARANCEUR CLEAR 09/06/2017 0452   LABSPEC 1.006 09/06/2017 0452   PHURINE 5.0 09/06/2017 0452   GLUCOSEU >=500 (A) 09/06/2017 0452   GLUCOSEU NEG mg/dL 03/04/2007 0829   HGBUR NEGATIVE 09/06/2017 0452   HGBUR negative 11/13/2010 1522   BILIRUBINUR NEGATIVE 09/06/2017 0452   KETONESUR NEGATIVE 09/06/2017 0452   PROTEINUR NEGATIVE 09/06/2017 0452   UROBILINOGEN 0.2 03/23/2015 1805   NITRITE NEGATIVE 09/06/2017 0452   LEUKOCYTESUR NEGATIVE 09/06/2017 0452    ----------------------------------------------------------------------------------------------------------------   Imaging Results:    DG Chest Portable 1 View  Result Date: 07/11/2020 CLINICAL DATA:  Shortness of breath EXAM: PORTABLE CHEST 1 VIEW COMPARISON:  06/10/2020 FINDINGS: Cardiac shadow is stable. Defibrillator is again noted and stable. The lungs are well aerated bilaterally. Some scarring is again seen in the right upper lobe. No focal infiltrate or sizable effusion is seen. Mild vascular congestion is noted. No bony abnormality is noted. IMPRESSION: Stable vascular congestion.  No new focal abnormality is noted. Electronically Signed   By: Inez Catalina M.D.   On: 07/11/2020 03:27   Radiological Exams on Admission: DG Chest Portable 1 View  Result Date: 07/11/2020 CLINICAL DATA:  Shortness of breath  EXAM: PORTABLE CHEST 1 VIEW COMPARISON:  06/10/2020 FINDINGS: Cardiac shadow is stable. Defibrillator is again noted and stable. The lungs are well aerated bilaterally. Some scarring is again seen in the right upper lobe. No focal  infiltrate or sizable effusion is seen. Mild vascular congestion is noted. No bony abnormality is noted. IMPRESSION: Stable vascular congestion.  No new focal abnormality is noted. Electronically Signed   By: Inez Catalina M.D.   On: 07/11/2020 03:27   DVT Prophylaxis -SCD  AM Labs Ordered, also please review Full Orders  Family Communication: Admission, patients condition and plan of care including tests being ordered have been discussed with the patient  who indicate understanding and agree with the plan   Code Status - Full Code  Likely DC to  Home   Condition   stable Roxan Hockey M.D on 07/11/2020 at 6:17 PM Go to [email or web address] -  for contact info  [facility] - Office  [phone]

## 2020-07-11 NOTE — Progress Notes (Signed)
PT REMOVED FROM BIPAP ABOUT 1010 AND ON RA WAS 96%

## 2020-07-11 NOTE — ED Notes (Signed)
Pt resting with equal rise and chest fall  

## 2020-07-11 NOTE — ED Provider Notes (Signed)
[facility] EMERGENCY DEPARTMENT Provider Note   CSN: [id number] Arrival date & time: 07/11/20  0248     History Chief Complaint  Patient presents with  . Shortness of Breath    Antonio Cobb is a 59 y.o. male.  Level 5 caveat for respiratory distress.  Patient here by EMS with difficulty breathing.  States he woke up from a bad dream and felt rattling in his chest.  He felt anxious after going to the bathroom and had progressively worsening shortness of breath.  He states this is similar to previous episodes of CHF.  He received 2 nitroglycerin by EMS and started to feel improved but still short of breath.  Denies any chest pain, cough or fever.  No leg pain or leg swelling.  States compliance with his medications.  No missed doses of diuretics.  No change in his weight.  Did receive his Covid vaccines. Felt well when he went to bed.  Antonio Cobb is a 59 y.o. male with medical history significant for ischemic cardiomyopathy with an EF 20% on last echo done in May 2021 implanted ICD followed by Dr. Lovena Le, hyperlipidemia, COPD, OSA, poorly controlled type 2 diabetes mellitus and hypertension   The history is provided by the patient and the EMS personnel. The history is limited by the condition of the patient.  Shortness of Breath Associated symptoms: cough   Associated symptoms: no abdominal pain, no chest pain, no fever, no headaches, no rash and no vomiting        Past Medical History:  Diagnosis Date  . Acute CHF (congestive heart failure) ([place]) 01/21/2020  . Acute on chronic combined systolic and diastolic CHF (congestive heart failure) ([place])   . Acute respiratory failure with hypoxia and hypercapnia (HCC)   . Acute systolic CHF (congestive heart failure) ([place]) 05/08/2020  . Alcohol abuse   . CAD (coronary artery disease)    a. s/p inferolateral STEMI in 05/2012 with DES to LCx b. patent stent by cath in 05/2016 with moderate RCA and LAD disease  . Cardiomyopathy  ([place])    a. LVEF 30-35% by echo in 09/2016. b. 08/2017: echo showing EF remains reduced at 25-30% c. s/p [employer] ICD placement in 01/2018  . CHF (congestive heart failure) ([place])   . CHF exacerbation ([place]) 11/18/2019  . COPD (chronic obstructive pulmonary disease) ([place])   . Diabetes mellitus, type 2 ([place])   . Essential hypertension   . Hypertensive urgency   . Mitral regurgitation    Moderate  . Mixed hyperlipidemia   . Myocardial infarction ([place]) 05/23/2012   Inferolateral STEMI  . Obesity     Patient Active Problem List   Diagnosis Date Noted  . Vitamin D deficiency 06/10/2020  . Acute systolic CHF (congestive heart failure) ([place]) 06/09/2020  . Acute respiratory failure with hypercapnia ([place]) 06/09/2020  . COPD (chronic obstructive pulmonary disease) ([place]) 06/09/2020  . Acute on chronic respiratory failure with hypoxia and hypercapnia ([place]) 06/09/2020  . Acute pain of left wrist 05/28/2020  . Gout attack 05/28/2020  . Environmental and seasonal allergies 03/27/2020  . OSA (obstructive sleep apnea)  01/24/2020  . Dyspnea 01/21/2020  . Encounter for support and coordination of transition of care 11/30/2019  . CAD (coronary artery disease)- s/p STEMI with DES to Lt Cir in 2013/S/p AICD 01/2018 11/19/2019  . Ischemic cardiomyopathy with implantable cardioverter-defibrillator (ICD)/s/p STEMI with DES to Lt Cir in 2013/S/p AICD 01/2018 11/19/2019  . Anxiety attack/panic attacks 11/19/2019  .  Flash pulmonary edema ([place]) 11/13/2019  . Uncontrolled type 1 diabetes mellitus with hyperglycemia ([place]) 10/11/2019  . Pulmonary edema 04/05/2019  . Muscle pain 02/28/2018  . Chronic systolic (congestive) heart failure ([place]) 02/01/2018  . DM type 2 causing vascular disease ([place]) 10/18/2017  . Personal history of noncompliance with medical treatment, presenting hazards to health 10/18/2017  . Lumbar degenerative disc disease 06/20/2017  . Upper airway cough syndrome 05/27/2017  . Chronic  systolic heart failure ([place]) 11/01/2016  . AKI (acute kidney injury) ([place]) 10/07/2016  . Prolonged QT interval 10/07/2016  . Coronary artery disease involving coronary bypass graft of native heart with angina pectoris ([place])   . Abnormal echocardiogram   . Allergic rhinitis 05/06/2015  . Cough 05/10/2013  . Alcohol abuse 05/23/2012  . Arteriosclerotic cardiovascular disease (ASCVD) 05/23/2012  . ED (erectile dysfunction) 05/10/2012  . Overweight with body mass index (BMI) of 29 to 29.9 in adult 08/12/2010  . Insulin-requiring or dependent type II diabetes mellitus ([place]) 03/08/2008  . Mixed hyperlipidemia 03/08/2008  . Essential hypertension 03/08/2008    Past Surgical History:  Procedure Laterality Date  . CARDIAC CATHETERIZATION  2 yrs ago  . CARDIAC CATHETERIZATION N/A 05/15/2016   Procedure: Right/Left Heart Cath and Coronary Angiography;  Surgeon: [name], MD;  Location: [place] CV LAB;  Service: Cardiovascular;  Laterality: N/A;  . CORONARY STENT PLACEMENT  05/23/12  . ICD IMPLANT N/A 02/01/2018   Procedure: ICD IMPLANT;  Surgeon: Evans Lance, MD;  Location: [place] CV LAB;  Service: Cardiovascular;  Laterality: N/A;  . LEFT HEART CATHETERIZATION WITH CORONARY ANGIOGRAM N/A 05/23/2012   Procedure: LEFT HEART CATHETERIZATION WITH CORONARY ANGIOGRAM;  Surgeon: [name], MD;  Location: [facility] CATH LAB;  Service: Cardiovascular;  Laterality: N/A;  . None    . PERCUTANEOUS CORONARY STENT INTERVENTION (PCI-S) N/A 05/23/2012   Procedure: PERCUTANEOUS CORONARY STENT INTERVENTION (PCI-S);  Surgeon: [name], MD;  Location: [facility] CATH LAB;  Service: Cardiovascular;  Laterality: N/A;  . POLYPECTOMY  10/16/2011   Procedure: POLYPECTOMY;  Surgeon: [name], MD;  Location: [facility] ORS;  Service: Endoscopy;;  Polypoid Lesion, Transverse and Sigmoid Colon       Family History  Problem Relation Age of Onset  . Emphysema Mother   . Heart attack Mother   . Hypertension  Mother   . Diabetes Mother   . Heart disease Mother   . COPD Mother   . Emphysema Father   . COPD Father   . Stroke Sister   . Asthma Sister   . Colon cancer Neg Hx   . Anesthesia problems Neg Hx   . Hypotension Neg Hx   . Malignant hyperthermia Neg Hx   . Pseudochol deficiency Neg Hx     Social History   Tobacco Use  . Smoking status: Former Smoker    Packs/day: 0.25    Years: 30.00    Pack years: 7.50    Types: Cigars    Quit date: 05/23/2016    Years since quitting: 4.1  . Smokeless tobacco: Never Used  . Tobacco comment: since early 65s  Vaping Use  . Vaping Use: Never used  Substance Use Topics  . Alcohol use: Yes    Alcohol/week: 0.0 standard drinks    Comment: occ  . Drug use: No    Home Medications Prior to Admission medications   Medication Sig Start Date End Date Taking? Authorizing Provider  allopurinol (ZYLOPRIM) 300 MG tablet Take 1 tablet (300 mg total)  by mouth daily. 06/07/20   [name], MD  aspirin EC 81 MG EC tablet Take 1 tablet (81 mg total) by mouth daily with breakfast. 11/20/19   Roxan Hockey, MD  docusate sodium (COLACE) 100 MG capsule Take 100 mg by mouth daily.    [provider]  famotidine (PEPCID) 20 MG tablet Take 1 tablet (20 mg total) by mouth at bedtime. 01/25/20   Orson Eva, MD  glipiZIDE (GLUCOTROL XL) 5 MG 24 hr tablet Take 1 tablet (5 mg total) by mouth daily with breakfast. 07/02/20   Cassandria Anger, MD  hydrOXYzine (ATARAX/VISTARIL) 25 MG tablet Take one tablet by mouth at bedtime, for anxiety and sleep Patient taking differently: Take 25 mg by mouth at bedtime. Take one tablet by mouth at bedtime, for anxiety and sleep 04/09/20   [name], MD  insulin aspart (NOVOLOG) 100 UNIT/ML FlexPen Inject 10-16 Units into the skin 3 (three) times daily with meals. 05/28/20   Cassandria Anger, MD  insulin glargine (LANTUS SOLOSTAR) 100 UNIT/ML Solostar Pen Inject 60 Units into the skin at bedtime.  07/02/20   Cassandria Anger, MD  losartan (COZAAR) 25 MG tablet Take 1 tablet (25 mg total) by mouth daily. 03/27/20   Perlie Mayo, NP  Melatonin 10 MG TABS Take 2 tablets by mouth at bedtime as needed (for sleep).     [provider]  metoprolol succinate (TOPROL-XL) 50 MG 24 hr tablet Take 1 tablet (50 mg total) by mouth daily. TAKE 1 TABLET BY MOUTH EVERY DAY WITH FOOD 07/10/20   Arnoldo Lenis, MD  Multiple Vitamins-Minerals (MULTIVITAMIN WITH MINERALS) tablet Take 1 tablet by mouth daily. 11/20/19 11/19/20  Roxan Hockey, MD  pantoprazole (PROTONIX) 40 MG tablet Take 1 tablet (40 mg total) by mouth daily. Take 30-59min before first meal of the day. 04/01/20   Lauraine Rinne, NP  potassium chloride SA (KLOR-CON) 20 MEQ tablet Take 1 tablet (20 mEq total) by mouth daily. 11/20/19   Roxan Hockey, MD  predniSONE (DELTASONE) 10 MG tablet Take 1 tablet (10 mg total) by mouth 2 (two) times daily with a meal. 07/03/20   [name], MD  rosuvastatin (CRESTOR) 40 MG tablet Take 1 tablet (40 mg total) by mouth daily. 11/20/19   Roxan Hockey, MD  sertraline (ZOLOFT) 25 MG tablet Take 1 tablet (25 mg total) by mouth daily. For anxiety/panic attacks 04/09/20 04/09/21  [name], MD  spironolactone (ALDACTONE) 25 MG tablet Take 1 tablet (25 mg total) by mouth daily. 06/25/20 09/23/20  Strader, Fransisco Hertz, PA-C  torsemide (DEMADEX) 20 MG tablet Take 3 tablets (60 mg total) by mouth daily. 07/09/20   Strader, Fransisco Hertz, PA-C  Vitamin D, Ergocalciferol, (DRISDOL) 1.25 MG (50000 UNIT) CAPS capsule Take 1 capsule (50,000 Units total) by mouth every 7 (seven) days. 06/10/20 08/09/20  Murlean Iba, MD    Allergies    Entresto [sacubitril-valsartan] and Other  Review of Systems   Review of Systems  Constitutional: Negative for activity change, appetite change and fever.  HENT: Negative for congestion and rhinorrhea.   Respiratory: Positive for cough and shortness of  breath. Negative for chest tightness.   Cardiovascular: Negative for chest pain and leg swelling.  Gastrointestinal: Negative for abdominal pain, nausea and vomiting.  Genitourinary: Negative for dysuria, hematuria, testicular pain and urgency.  Musculoskeletal: Negative for arthralgias and myalgias.  Skin: Negative for rash.  Neurological: Negative for light-headedness and headaches.   all  other systems are negative except as noted in the HPI and PMH.   Physical Exam Updated Vital Signs BP (!) 152/87   Pulse 80   Temp 97.7 F (36.5 C) (Oral)   Resp (!) 25   Ht 5\' 10"  (1.778 m)   Wt (!) 93 kg   SpO2 98%   BMI 29.41 kg/m   Physical Exam Vitals and nursing note reviewed.  Constitutional:      General: He is in acute distress.     Appearance: He is well-developed.     Comments: Tachypnea, dyspnea with conversation Speaking in short phrases  HENT:     Head: Normocephalic and atraumatic.     Mouth/Throat:     Pharynx: No oropharyngeal exudate.  Eyes:     Conjunctiva/sclera: Conjunctivae normal.     Pupils: Pupils are equal, round, and reactive to light.  Neck:     Comments: No meningismus. Cardiovascular:     Rate and Rhythm: Normal rate and regular rhythm.     Heart sounds: Normal heart sounds. No murmur heard.   Pulmonary:     Effort: Pulmonary effort is normal. No respiratory distress.     Breath sounds: Rales present.     Comments: Bibasilar crackles Abdominal:     Palpations: Abdomen is soft.     Tenderness: There is no abdominal tenderness. There is no guarding or rebound.  Musculoskeletal:        General: No tenderness. Normal range of motion.     Cervical back: Normal range of motion and neck supple.     Right lower leg: No edema.     Left lower leg: No edema.  Skin:    General: Skin is warm.  Neurological:     General: No focal deficit present.     Mental Status: He is alert and oriented to person, place, and time. Mental status is at baseline.      Cranial Nerves: No cranial nerve deficit.     Motor: No abnormal muscle tone.     Coordination: Coordination normal.     Comments: No ataxia on finger to nose bilaterally. No pronator drift. 5/5 strength throughout. CN 2-12 intact.Equal grip strength. Sensation intact.   Psychiatric:        Behavior: Behavior normal.     ED Results / Procedures / Treatments   Labs (all labs ordered are listed, but only abnormal results are displayed) Labs Reviewed  BASIC METABOLIC PANEL - Abnormal; Notable for the following components:      Result Value   Glucose, Bld 272 (*)    BUN 35 (*)    Creatinine, Ser 1.54 (*)    Calcium 8.6 (*)    GFR calc non Af Amer 48 (*)    GFR calc Af Amer 56 (*)    All other components within normal limits  BRAIN NATRIURETIC PEPTIDE - Abnormal; Notable for the following components:   B Natriuretic Peptide 213.0 (*)    All other components within normal limits  BLOOD GAS, ARTERIAL - Abnormal; Notable for the following components:   pO2, Arterial 109 (*)    All other components within normal limits  TROPONIN I (HIGH SENSITIVITY) - Abnormal; Notable for the following components:   Troponin I (High Sensitivity) 23 (*)    All other components within normal limits  TROPONIN I (HIGH SENSITIVITY) - Abnormal; Notable for the following components:   Troponin I (High Sensitivity) 21 (*)    All other components within normal limits  SARS CORONAVIRUS 2 BY RT PCR [facility] ORDER, [place] LAB)  CBC    EKG EKG Interpretation  Date/Time:  Thursday July 11 2020 02:58:12 EDT Ventricular Rate:  79 PR Interval:    QRS Duration: 87 QT Interval:  382 QTC Calculation: 438 R Axis:   -14 Text Interpretation: Sinus rhythm Posterior infarct, old No significant change was found Confirmed by Ezequiel Essex [phone]) on 07/11/2020 3:24:24 AM   Radiology DG Chest Portable 1 View  Result Date: 07/11/2020 CLINICAL DATA:  Shortness of breath EXAM: PORTABLE  CHEST 1 VIEW COMPARISON:  06/10/2020 FINDINGS: Cardiac shadow is stable. Defibrillator is again noted and stable. The lungs are well aerated bilaterally. Some scarring is again seen in the right upper lobe. No focal infiltrate or sizable effusion is seen. Mild vascular congestion is noted. No bony abnormality is noted. IMPRESSION: Stable vascular congestion.  No new focal abnormality is noted. Electronically Signed   By: Inez Catalina M.D.   On: 07/11/2020 03:27    Procedures .Critical Care Performed by: Ezequiel Essex, MD Authorized by: Ezequiel Essex, MD   Critical care provider statement:    Critical care time (minutes):  45   Critical care was necessary to treat or prevent imminent or life-threatening deterioration of the following conditions:  Cardiac failure and respiratory failure   Critical care was time spent personally by me on the following activities:  Discussions with consultants, evaluation of patient's response to treatment, examination of patient, ordering and performing treatments and interventions, ordering and review of laboratory studies, ordering and review of radiographic studies, pulse oximetry, re-evaluation of patient's condition, obtaining history from patient or surrogate and review of old charts   (including critical care time)  Medications Ordered in ED Medications  sodium chloride flush (NS) 0.9 % injection 3 mL (has no administration in time range)    ED Course  I have reviewed the triage vital signs and the nursing notes.  Pertinent labs & imaging results that were available during my care of the patient were reviewed by me and considered in my medical decision making (see chart for details).    MDM Rules/Calculators/A&P                         Sudden onset shortness of breath similar to previous episodes of pulmonary edema.  No chest pain.  Tachypneic and placed on 2 L of oxygen.  Patient gasping for breath on arrival we will start BiPAP.  EKG shows  no acute ischemia.  Chest x-ray does show stable vascular congestion.  ABG shows no significant CO2 retention.  Patient feels much improved on BiPAP.  He is given IV Lasix.  No additional nitroglycerin given after EMS nitroglycerin.  Blood pressure is improved to 188 systolic. Creatinine near baseline.  Troponin remains flat.  Patient would benefit from continued IV diuresis given his respiratory distress and BiPAP requirement.  Discussed with Dr. Josephine Cables.   Antonio Cobb was evaluated in Emergency Department on 07/11/2020 for the symptoms described in the history of present illness. He was evaluated in the context of the global COVID-19 pandemic, which necessitated consideration that the patient might be at risk for infection with the SARS-CoV-2 virus that causes COVID-19. Institutional protocols and algorithms that pertain to the evaluation of patients at risk for COVID-19 are in a state of rapid change based on information released by regulatory bodies including the CDC and federal and state organizations. These policies and  algorithms were followed during the patient's care in the ED.  Final Clinical Impression(s) / ED Diagnoses Final diagnoses:  Acute on chronic systolic congestive heart failure [facility])    Rx / DC Orders ED Discharge Orders    None       Carmeline Kowal, Annie Main, MD 07/11/20 [phone]

## 2020-07-12 ENCOUNTER — Telehealth: Payer: Self-pay | Admitting: Family Medicine

## 2020-07-12 DIAGNOSIS — I5043 Acute on chronic combined systolic (congestive) and diastolic (congestive) heart failure: Secondary | ICD-10-CM | POA: Diagnosis not present

## 2020-07-12 LAB — CBC
HCT: 41.8 % (ref 39.0–52.0)
Hemoglobin: 13.1 g/dL (ref 13.0–17.0)
MCH: 27.6 pg (ref 26.0–34.0)
MCHC: 31.3 g/dL (ref 30.0–36.0)
MCV: 88.2 fL (ref 80.0–100.0)
Platelets: 308 10*3/uL (ref 150–400)
RBC: 4.74 MIL/uL (ref 4.22–5.81)
RDW: 13.8 % (ref 11.5–15.5)
WBC: 10.9 10*3/uL — ABNORMAL HIGH (ref 4.0–10.5)
nRBC: 0 % (ref 0.0–0.2)

## 2020-07-12 LAB — BASIC METABOLIC PANEL
Anion gap: 10 (ref 5–15)
BUN: 35 mg/dL — ABNORMAL HIGH (ref 6–20)
CO2: 26 mmol/L (ref 22–32)
Calcium: 9.3 mg/dL (ref 8.9–10.3)
Chloride: 100 mmol/L (ref 98–111)
Creatinine, Ser: 1.56 mg/dL — ABNORMAL HIGH (ref 0.61–1.24)
GFR calc Af Amer: 55 mL/min — ABNORMAL LOW (ref >60)
GFR calc non Af Amer: 48 mL/min — ABNORMAL LOW (ref >60)
Glucose, Bld: 233 mg/dL — ABNORMAL HIGH (ref 70–99)
Potassium: 4 mmol/L (ref 3.5–5.1)
Sodium: 136 mmol/L (ref 135–145)

## 2020-07-12 LAB — GLUCOSE, CAPILLARY
Glucose-Capillary: 187 mg/dL — ABNORMAL HIGH (ref 70–99)
Glucose-Capillary: 207 mg/dL — ABNORMAL HIGH (ref 70–99)
Glucose-Capillary: 190 mg/dL — ABNORMAL HIGH (ref 70–99)

## 2020-07-12 MED ORDER — ASPIRIN 81 MG PO TBEC: 81.0000 mg | DELAYED_RELEASE_TABLET | Freq: Every day | ORAL | 3 refills | Status: AC

## 2020-07-12 MED ORDER — TORSEMIDE 20 MG PO TABS: 60.0000 mg | ORAL_TABLET | Freq: Every day | ORAL | 6 refills | Status: DC

## 2020-07-12 MED ORDER — SPIRONOLACTONE 25 MG PO TABS
25.0000 mg | ORAL_TABLET | Freq: Every day | ORAL | 3 refills | Status: AC
Start: 2020-07-12 — End: 2020-10-10

## 2020-07-12 NOTE — Progress Notes (Signed)
Nsg Discharge Note  Admit Date:  07/11/2020 Discharge date: 07/12/2020   Edwena Bunde to be D/C'd Home per MD order.  AVS completed.  Copy for chart, and copy for patient signed, and dated. Patient/caregiver able to verbalize understanding.  Discharge Medication: Allergies as of 07/12/2020      Reactions   Entresto [sacubitril-valsartan]    Dizziness, Coughing   Other    Seasonal allergies  - has to use inhaler      Medication List    TAKE these medications   allopurinol 300 MG tablet Commonly known as: ZYLOPRIM Take 1 tablet (300 mg total) by mouth daily.   aspirin 81 MG EC tablet Take 1 tablet (81 mg total) by mouth daily with breakfast.   docusate sodium 100 MG capsule Commonly known as: COLACE Take 100 mg by mouth daily.   famotidine 20 MG tablet Commonly known as: PEPCID Take 1 tablet (20 mg total) by mouth at bedtime.   glipiZIDE 5 MG 24 hr tablet Commonly known as: GLUCOTROL XL Take 1 tablet (5 mg total) by mouth daily with breakfast.   Lantus SoloStar 100 UNIT/ML Solostar Pen Generic drug: insulin glargine Inject 60 Units into the skin at bedtime.   losartan 25 MG tablet Commonly known as: COZAAR Take 1 tablet (25 mg total) by mouth daily.   Melatonin 10 MG Tabs Take 2 tablets by mouth at bedtime as needed (for sleep).   metoprolol succinate 50 MG 24 hr tablet Commonly known as: TOPROL-XL Take 1 tablet (50 mg total) by mouth daily. TAKE 1 TABLET BY MOUTH EVERY DAY WITH FOOD   multivitamin with minerals tablet Take 1 tablet by mouth daily.   pantoprazole 40 MG tablet Commonly known as: Protonix Take 1 tablet (40 mg total) by mouth daily. Take 30-87min before first meal of the day.   potassium chloride SA 20 MEQ tablet Commonly known as: KLOR-CON Take 1 tablet (20 mEq total) by mouth daily.   predniSONE 10 MG tablet Commonly known as: DELTASONE Take 1 tablet (10 mg total) by mouth 2 (two) times daily with a meal.   rosuvastatin 40 MG  tablet Commonly known as: CRESTOR Take 1 tablet (40 mg total) by mouth daily.   sertraline 25 MG tablet Commonly known as: ZOLOFT Take 1 tablet (25 mg total) by mouth daily. For anxiety/panic attacks   spironolactone 25 MG tablet Commonly known as: ALDACTONE Take 1 tablet (25 mg total) by mouth daily.   torsemide 20 MG tablet Commonly known as: DEMADEX Take 3 tablets (60 mg total) by mouth daily.   Vitamin D (Ergocalciferol) 1.25 MG (50000 UNIT) Caps capsule Commonly known as: DRISDOL Take 1 capsule (50,000 Units total) by mouth every 7 (seven) days.       Discharge Assessment: Vitals:   07/12/20 0416 07/12/20 0821  BP: (!) 148/82 (!) 138/89  Pulse: 72 82  Resp: 18 16  Temp: 97.7 F (36.5 C) 100.2 F (37.9 C)  SpO2: 99% 94%   Skin clean, dry and intact without evidence of skin break down, no evidence of skin tears noted. IV catheter discontinued intact. Site without signs and symptoms of complications - no redness or edema noted at insertion site, patient denies c/o pain - only slight tenderness at site.  Dressing with slight pressure applied.  D/c Instructions-Education: Discharge instructions given to patient/family with verbalized understanding. D/c education completed with patient/family including follow up instructions, medication list, d/c activities limitations if indicated, with other d/c instructions as indicated by  MD - patient able to verbalize understanding, all questions fully answered. Patient instructed to return to ED, call 911, or call MD for any changes in condition.  Patient escorted via [place], and D/C home via private auto.  Dorcas Mcmurray, LPN 3/90/3009 2:33 PM

## 2020-07-12 NOTE — Telephone Encounter (Signed)
Appt. Made

## 2020-07-12 NOTE — TOC Initial Note (Signed)
Transition of Care [facility]) - Initial/Assessment Note    Patient Details  Name: Antonio Cobb MRN: [id number] Date of Birth: August 21, 1961  Transition of Care [facility]) CM/SW Contact:    Salome Arnt, [place] Phone Number: 07/12/2020, 8:28 AM  Clinical Narrative:  TOC completed assessment due to high risk of readmission. Pt admitted due to CHF exacerbation. He reports he lives with his wife and works full time. Pt indicates he is feeling much better this morning. He plans to return home when medically stable and reports no needs at this time. LCSW called PCP office (Dr. Moshe Cipro) to set up hospital follow up appointment, but had to leave voicemail. Will update AVS with appointment time when scheduled.                 Expected Discharge Plan: Home/Self Care Barriers to Discharge: Continued Medical Work up   Patient Goals and CMS Choice Patient states their goals for this hospitalization and ongoing recovery are:: return home      Expected Discharge Plan and Services Expected Discharge Plan: Home/Self Care In-house Referral: Clinical Social Work     Living arrangements for the past 2 months: Single Family Home                                      Prior Living Arrangements/Services Living arrangements for the past 2 months: Single Family Home Lives with:: Spouse Patient language and need for interpreter reviewed:: Yes Do you feel safe going back to the place where you live?: Yes      Need for Family Participation in Patient Care: No (Comment)     Criminal Activity/Legal Involvement Pertinent to Current Situation/Hospitalization: No - Comment as needed  Activities of Daily Living Home Assistive Devices/Equipment: Eyeglasses, CBG Meter ADL Screening (condition at time of admission) Patient's cognitive ability adequate to safely complete daily activities?: Yes Is the patient deaf or have difficulty hearing?: No Does the patient have difficulty seeing, even when  wearing glasses/contacts?: No Does the patient have difficulty concentrating, remembering, or making decisions?: No Patient able to express need for assistance with ADLs?: Yes Does the patient have difficulty dressing or bathing?: No Independently performs ADLs?: Yes (appropriate for developmental age) Does the patient have difficulty walking or climbing stairs?: No Weakness of Legs: None Weakness of Arms/Hands: Left  Permission Sought/Granted                  Emotional Assessment Appearance:: Appears stated age Attitude/Demeanor/Rapport: Engaged Affect (typically observed): Appropriate Orientation: : Oriented to Place, Oriented to  Time, Oriented to Situation, Oriented to Self Alcohol / Substance Use: Not Applicable Psych Involvement: No (comment)  Admission diagnosis:  Acute exacerbation of CHF (congestive heart failure) (HCC) [I50.9] CHF exacerbation ([place]) [I50.9] Acute on chronic systolic congestive heart failure ([place]) [I50.23] Patient Active Problem List   Diagnosis Date Noted  . CHF exacerbation ([place]) 07/11/2020  . Acute exacerbation of CHF (congestive heart failure) ([place]) 07/11/2020  . Vitamin D deficiency 06/10/2020  . Acute systolic CHF (congestive heart failure) ([place]) 06/09/2020  . Acute respiratory failure with hypercapnia ([place]) 06/09/2020  . COPD (chronic obstructive pulmonary disease) ([place]) 06/09/2020  . Acute on chronic respiratory failure with hypoxia and hypercapnia ([place]) 06/09/2020  . Acute pain of left wrist 05/28/2020  . Gout attack 05/28/2020  . Environmental and seasonal allergies 03/27/2020  . OSA (obstructive sleep apnea)  01/24/2020  . Dyspnea  01/21/2020  . Encounter for support and coordination of transition of care 11/30/2019  . CAD (coronary artery disease)- s/p STEMI with DES to Lt Cir in 2013/S/p AICD 01/2018 11/19/2019  . Ischemic cardiomyopathy with implantable cardioverter-defibrillator (ICD)/s/p STEMI with DES to Lt Cir in 2013/S/p AICD  01/2018 11/19/2019  . Anxiety attack/panic attacks 11/19/2019  . Flash pulmonary edema ([place]) 11/13/2019  . Uncontrolled type 1 diabetes mellitus with hyperglycemia ([place]) 10/11/2019  . Pulmonary edema 04/05/2019  . Muscle pain 02/28/2018  . Chronic systolic (congestive) heart failure ([place]) 02/01/2018  . DM type 2 causing vascular disease ([place]) 10/18/2017  . Personal history of noncompliance with medical treatment, presenting hazards to health 10/18/2017  . Lumbar degenerative disc disease 06/20/2017  . Upper airway cough syndrome 05/27/2017  . Chronic systolic heart failure ([place]) 11/01/2016  . AKI (acute kidney injury) ([place]) 10/07/2016  . Prolonged QT interval 10/07/2016  . Coronary artery disease involving coronary bypass graft of native heart with angina pectoris ([place])   . Abnormal echocardiogram   . Allergic rhinitis 05/06/2015  . Cough 05/10/2013  . Alcohol abuse 05/23/2012  . Arteriosclerotic cardiovascular disease (ASCVD) 05/23/2012  . ED (erectile dysfunction) 05/10/2012  . Overweight with body mass index (BMI) of 29 to 29.9 in adult 08/12/2010  . Insulin-requiring or dependent type II diabetes mellitus ([place]) 03/08/2008  . Mixed hyperlipidemia 03/08/2008  . Essential hypertension 03/08/2008   PCP:  [name], MD Pharmacy:   [facility], [place] AT [place] [address] [place] [address] Phone: [phone] Fax: [phone]     Social Determinants of Health (SDOH) Interventions    Readmission Risk Interventions Readmission Risk Prevention Plan 07/12/2020 11/13/2019 04/05/2019  Medication Screening - Complete Complete  Transportation Screening Complete Complete Complete  Medication Review (RN Care Manager) Complete - -  HRI or Home Care Consult Complete - -  SW Recovery Care/Counseling Consult Complete - -  Palliative Care Screening Not Applicable - -  [place] Not  Applicable - -  Some recent data might be hidden

## 2020-07-12 NOTE — Telephone Encounter (Signed)
Hospital called to schedule appt for d/c

## 2020-07-12 NOTE — Discharge Summary (Signed)
Antonio Cobb, is a 59 y.o. male  DOB 09-13-1961  MRN [id number].  Admission date:  07/11/2020  Admitting Physician  Roxan Hockey, MD  Discharge Date:  07/12/2020   Primary MD  [name], MD  Recommendations for primary care physician for things to follow:   1)Very low-salt diet advised 2)Weigh yourself daily, call if you gain more than 3 pounds in 1 day or more than 5 pounds in 1 week as your diuretic medications may need to be adjusted 3)Limit your Fluid  intake to no more than 60 ounces (1.8 Liters) per day 4)Avoid ibuprofen/Advil/Aleve/Motrin/Goody Powders/Naproxen/BC powders/Meloxicam/Diclofenac/Indomethacin and other Nonsteroidal anti-inflammatory medications as these will make you more likely to bleed and can cause stomach ulcers, can also cause Kidney problems.   5)--follow-up with your primary care physician within a week for recheck and reevaluation and for repeat BMP test  6) continue to use oxygen at night  Admission Diagnosis  Acute exacerbation of CHF (congestive heart failure) ([place]) [I50.9] CHF exacerbation (HCC) [I50.9] Acute on chronic systolic congestive heart failure ([place]) [I50.23]   Discharge Diagnosis  Acute exacerbation of CHF (congestive heart failure) ([place]) [I50.9] CHF exacerbation ([place]) [I50.9] Acute on chronic systolic congestive heart failure ([place]) [I50.23]    Active Problems:   CHF exacerbation (HCC)   Acute exacerbation of CHF (congestive heart failure) ([place])      Past Medical History:  Diagnosis Date   Acute CHF (congestive heart failure) ([place]) 01/21/2020   Acute on chronic combined systolic and diastolic CHF (congestive heart failure) (HCC)    Acute respiratory failure with hypoxia and hypercapnia (HCC)    Acute systolic CHF (congestive heart failure) ([place]) 05/08/2020   Alcohol abuse    CAD (coronary artery disease)    a. s/p inferolateral  STEMI in 05/2012 with DES to LCx b. patent stent by cath in 05/2016 with moderate RCA and LAD disease   Cardiomyopathy ([place])    a. LVEF 30-35% by echo in 09/2016. b. 08/2017: echo showing EF remains reduced at 25-30% c. s/p [employer] ICD placement in 01/2018   CHF (congestive heart failure) (HCC)    CHF exacerbation ([place]) 11/18/2019   COPD (chronic obstructive pulmonary disease) ([place])    Diabetes mellitus, type 2 ([place])    Essential hypertension    Hypertensive urgency    Mitral regurgitation    Moderate   Mixed hyperlipidemia    Myocardial infarction ([place]) 05/23/2012   Inferolateral STEMI   Obesity     Past Surgical History:  Procedure Laterality Date   CARDIAC CATHETERIZATION  2 yrs ago   CARDIAC CATHETERIZATION N/A 05/15/2016   Procedure: Right/Left Heart Cath and Coronary Angiography;  Surgeon: [name], MD;  Location: [facility];  Service: Cardiovascular;  Laterality: N/A;   CORONARY STENT PLACEMENT  05/23/12   ICD IMPLANT N/A 02/01/2018   Procedure: ICD IMPLANT;  Surgeon: Evans Lance, MD;  Location: [facility];  Service: Cardiovascular;  Laterality: N/A;   LEFT HEART CATHETERIZATION WITH  CORONARY ANGIOGRAM N/A 05/23/2012   Procedure: LEFT HEART CATHETERIZATION WITH CORONARY ANGIOGRAM;  Surgeon: [name], MD;  Location: [facility] CATH LAB;  Service: Cardiovascular;  Laterality: N/A;   None     PERCUTANEOUS CORONARY STENT INTERVENTION (PCI-S) N/A 05/23/2012   Procedure: PERCUTANEOUS CORONARY STENT INTERVENTION (PCI-S);  Surgeon: [name], MD;  Location: [facility] CATH LAB;  Service: Cardiovascular;  Laterality: N/A;   POLYPECTOMY  10/16/2011   Procedure: POLYPECTOMY;  Surgeon: [name], MD;  Location: [facility];  Service: Endoscopy;;  Polypoid Lesion, Transverse and Sigmoid Colon     HPI  from the history and physical done on the day of admission:     Antonio Cobb  is a 59 y.o. male with past medical history relevant for  CAD(s/p STEMI in 2013 with DES to LCx, patent by repeat cath in 05/2016 with moderate residual CAD along RCA and LAD), h/o ischemic cardiomyopathy with an EF 20% on last echo done in May 2021 implanted ICD (01/2018) followed by Dr. Lovena Le, combined systolic and diastolic dysfunction CHF hyperlipidemia, COPD, OSA,poorly controlled type 2 diabetes mellitus andhypertensionpresents with severe shortness of breath and hypoxia, failed nasal cannula oxygen required BiPAP initially due to increased work of breathing -Patient states that he felt like he had a bad dream when he woke up he was panicky he felt rattling in his chest equilibrated called EMS EMS given 2 nitroglycerin work-up bradycardia remained high he was placed on BiPAP after failing nasal cannula -In ED chest x-ray consistent with vascular congestion -Troponin 21 and  23 not consistent with ACS -BNP is 213. ---,, CBC WNL --Creatinine 1.5 which is close to patient's previous baseline -ABG on BiPAP reassuring No fever  Or chills  -No frank chest pains no productive cough no vomiting no diarrhea     Hospital Course:     1)Acute on chronic combined systolic and diastolic CHF exacerbation in a patient with ischemic cardiomyopathy with EF between 25% range--- --Troponin 21 and  23 not consistent with ACS -BNP 213 -- respiratory status much improved after IV diuresis and with BiPAP use -This morning patient ambulated over 200 feet without significant dyspnea on exertion  And no hypoxia  -Post ambulation O2 sats is 97% on room air -Patient feels back to baseline  2) acute hypoxic respiratory failure--- secondary to #1 above initially required BiPAP =-Improved after IV diuresis -TSH 3.19 -Weaned off BiPAP, -Manage as above #1 -Back to baseline as above #1  3) CKD stage - IIIa----Renal function appears to be at baseline at this time  --- renally adjust medications, avoid nephrotoxic agents / dehydration  / hypotension -  4)DM2-  A1c is 10.2, reflecting uncontrolled diabetes,  continue e Lantus insulin 60 units nightly  --Follow-up with PCP for further adjustments  5)OSA--on CPAP, compliance advised  6)HTN--okay to restart losartan and Aldactone   7)CAD(s/p STEMI in 2013 with DES to LCx, patent by repeat cath in 05/2016 with moderate residual CAD along RCA and LAD)----Troponin 21 and  23 not consistent with ACS, continue ASA,  continue Toprol-XL 50 mg daily along with Crestor 40 mg daily.  Disposition--discharged home in significantly improved condition  Dispo: The patient is from: Home  Anticipated d/c is to: Home  Discharge Condition: stable  Follow UP   Follow-up Information    [name], MD Follow up.   Specialty: Family Medicine Contact information: [address], [place] [address] [phone]        [name]  F, MD .   Specialty: Cardiology Contact information: [place] [address] [phone]        Evans Lance, MD .   Specialty: Cardiology Contact information: [place] [address] [phone]                Consults obtained - na  Diet and Activity recommendation:  As advised  Discharge Instructions    Discharge Instructions    Call MD for:  difficulty breathing, headache or visual disturbances   Complete by: As directed    Call MD for:  persistant dizziness or light-headedness   Complete by: As directed    Call MD for:  persistant nausea and vomiting   Complete by: As directed    Call MD for:  severe uncontrolled pain   Complete by: As directed    Call MD for:  temperature >100.4   Complete by: As directed    Diet - low sodium heart healthy   Complete by: As directed    Discharge instructions   Complete by: As directed    1)Very low-salt diet advised 2)Weigh yourself daily, call if you gain more than 3 pounds in 1 day or more than 5 pounds in 1 week as your diuretic  medications may need to be adjusted 3)Limit your Fluid  intake to no more than 60 ounces (1.8 Liters) per day 4)Avoid ibuprofen/Advil/Aleve/Motrin/Goody Powders/Naproxen/BC powders/Meloxicam/Diclofenac/Indomethacin and other Nonsteroidal anti-inflammatory medications as these will make you more likely to bleed and can cause stomach ulcers, can also cause Kidney problems.   5)--follow-up with your primary care physician within a week for recheck and reevaluation and for repeat BMP test  6) continue to use oxygen at night   Increase activity slowly   Complete by: As directed         Discharge Medications     Allergies as of 07/12/2020      Reactions   Entresto [sacubitril-valsartan]    Dizziness, Coughing   Other    Seasonal allergies  - has to use inhaler      Medication List    TAKE these medications   allopurinol 300 MG tablet Commonly known as: ZYLOPRIM Take 1 tablet (300 mg total) by mouth daily.   aspirin 81 MG EC tablet Take 1 tablet (81 mg total) by mouth daily with breakfast.   docusate sodium 100 MG capsule Commonly known as: COLACE Take 100 mg by mouth daily.   famotidine 20 MG tablet Commonly known as: PEPCID Take 1 tablet (20 mg total) by mouth at bedtime.   glipiZIDE 5 MG 24 hr tablet Commonly known as: GLUCOTROL XL Take 1 tablet (5 mg total) by mouth daily with breakfast.   Lantus SoloStar 100 UNIT/ML Solostar Pen Generic drug: insulin glargine Inject 60 Units into the skin at bedtime.   losartan 25 MG tablet Commonly known as: COZAAR Take 1 tablet (25 mg total) by mouth daily.   Melatonin 10 MG Tabs Take 2 tablets by mouth at bedtime as needed (for sleep).   metoprolol succinate 50 MG 24 hr tablet Commonly known as: TOPROL-XL Take 1 tablet (50 mg total) by mouth daily. TAKE 1 TABLET BY MOUTH EVERY DAY WITH FOOD   multivitamin with minerals tablet Take 1 tablet by mouth daily.   pantoprazole 40 MG tablet Commonly known as: Protonix Take 1  tablet (40 mg total) by mouth daily. Take 30-66min before first meal of the day.   potassium chloride SA 20  MEQ tablet Commonly known as: KLOR-CON Take 1 tablet (20 mEq total) by mouth daily.   predniSONE 10 MG tablet Commonly known as: DELTASONE Take 1 tablet (10 mg total) by mouth 2 (two) times daily with a meal.   rosuvastatin 40 MG tablet Commonly known as: CRESTOR Take 1 tablet (40 mg total) by mouth daily.   sertraline 25 MG tablet Commonly known as: ZOLOFT Take 1 tablet (25 mg total) by mouth daily. For anxiety/panic attacks   spironolactone 25 MG tablet Commonly known as: ALDACTONE Take 1 tablet (25 mg total) by mouth daily.   torsemide 20 MG tablet Commonly known as: DEMADEX Take 3 tablets (60 mg total) by mouth daily.   Vitamin D (Ergocalciferol) 1.25 MG (50000 UNIT) Caps capsule Commonly known as: DRISDOL Take 1 capsule (50,000 Units total) by mouth every 7 (seven) days.       Major procedures and Radiology Reports - PLEASE review detailed and final reports for all details, in brief -   DG Chest Portable 1 View  Result Date: 07/11/2020 CLINICAL DATA:  Shortness of breath EXAM: PORTABLE CHEST 1 VIEW COMPARISON:  06/10/2020 FINDINGS: Cardiac shadow is stable. Defibrillator is again noted and stable. The lungs are well aerated bilaterally. Some scarring is again seen in the right upper lobe. No focal infiltrate or sizable effusion is seen. Mild vascular congestion is noted. No bony abnormality is noted. IMPRESSION: Stable vascular congestion.  No new focal abnormality is noted. Electronically Signed   By: Inez Catalina M.D.   On: 07/11/2020 03:27    Micro Results   Recent Results (from the past 240 hour(s))  SARS Coronavirus 2 by RT PCR (hospital order, performed in [facility] hospital lab) Nasopharyngeal Nasopharyngeal Swab     Status: None   Collection Time: 07/11/20  3:06 AM   Specimen: Nasopharyngeal Swab  Result Value Ref Range Status   SARS Coronavirus 2  NEGATIVE NEGATIVE Final    Comment: (NOTE) SARS-CoV-2 target nucleic acids are NOT DETECTED.  The SARS-CoV-2 RNA is generally detectable in upper and lower respiratory specimens during the acute phase of infection. The lowest concentration of SARS-CoV-2 viral copies this assay can detect is 250 copies / mL. A negative result does not preclude SARS-CoV-2 infection and should not be used as the sole basis for treatment or other patient management decisions.  A negative result may occur with improper specimen collection / handling, submission of specimen other than nasopharyngeal swab, presence of viral mutation(s) within the areas targeted by this assay, and inadequate number of viral copies (<250 copies / mL). A negative result must be combined with clinical observations, patient history, and epidemiological information.  Fact Sheet for Patients:   StrictlyIdeas.no  Fact Sheet for Healthcare Providers: BankingDealers.co.za  This test is not yet approved or  cleared by the [place] FDA and has been authorized for detection and/or diagnosis of SARS-CoV-2 by FDA under an Emergency Use Authorization (EUA).  This EUA will remain in effect (meaning this test can be used) for the duration of the COVID-19 declaration under Section 564(b)(1) of the Act, 21 U.S.C. section 360bbb-3(b)(1), unless the authorization is terminated or revoked sooner.  Performed at [facility], [address], [place],  [address]        Today   Subjective    Antonio Cobb today has no new complaints No fever  Or chills   No Nausea, Vomiting or Diarrhea  Post ambulation O2 sats 97% on room air  Patient has been seen and examined prior to discharge   Objective   Blood pressure (!) 138/89, pulse 82, temperature 100.2 F (37.9 C), temperature source Oral, resp. rate 16, height 5\' 10"  (1.778 m), weight (!) 94.2 kg, SpO2 94  %.   Intake/Output Summary (Last 24 hours) at 07/12/2020 1416 Last data filed at 07/11/2020 1533 Gross per 24 hour  Intake --  Output 775 ml  Net -775 ml    Exam Gen:- Awake Alert, no acute distress, speaking in full sentences HEENT:- [place].AT, No sclera icterus Neck-Supple Neck,No JVD,.  Lungs-  CTAB , good air movement bilaterally  CV- S1, S2 normal, regular Abd-  +ve B.Sounds, Abd Soft, No tenderness,    Extremity/Skin:- No  edema,   good pulses Psych-affect is appropriate, oriented x3 Neuro-no new focal deficits, no tremors    Data Review   CBC w Diff:  Lab Results  Component Value Date   WBC 10.9 (H) 07/12/2020   HGB 13.1 07/12/2020   HCT 41.8 07/12/2020   PLT 308 07/12/2020   LYMPHOPCT 37 06/09/2020   MONOPCT 8 06/09/2020   EOSPCT 10 06/09/2020   BASOPCT 1 06/09/2020    CMP:  Lab Results  Component Value Date   NA 136 07/12/2020   K 4.0 07/12/2020   CL 100 07/12/2020   CO2 26 07/12/2020   BUN 35 (H) 07/12/2020   CREATININE 1.56 (H) 07/12/2020   CREATININE 1.51 (H) 05/28/2020   PROT 7.0 06/10/2020   ALBUMIN 3.5 06/10/2020   BILITOT 1.1 06/10/2020   ALKPHOS 75 06/10/2020   AST 22 06/10/2020   ALT 29 06/10/2020  .   Total Discharge time is about 33 minutes  Roxan Hockey M.D on 07/12/2020 at 2:16 PM  Go to [email or web address] -  for contact info  [facility] - Office  [phone]

## 2020-07-12 NOTE — Discharge Instructions (Signed)
1)Very low-salt diet advised 2)Weigh yourself daily, call if you gain more than 3 pounds in 1 day or more than 5 pounds in 1 week as your diuretic medications may need to be adjusted 3)Limit your Fluid  intake to no more than 60 ounces (1.8 Liters) per day 4)Avoid ibuprofen/Advil/Aleve/Motrin/Goody Powders/Naproxen/BC powders/Meloxicam/Diclofenac/Indomethacin and other Nonsteroidal anti-inflammatory medications as these will make you more likely to bleed and can cause stomach ulcers, can also cause Kidney problems.   5)--follow-up with your primary care physician within a week for recheck and reevaluation and for repeat BMP test  6) continue to use oxygen at night

## 2020-07-15 ENCOUNTER — Telehealth: Payer: Self-pay

## 2020-07-15 NOTE — Telephone Encounter (Signed)
[id number]  Attempted to contact patient to complete [facility] telephone call. No answer. Left message requesting call back. Will try again later. 2nd attempt

## 2020-07-15 NOTE — Telephone Encounter (Signed)
[id number]  Attempted to contact patient to complete [facility] telephone call. No answer. Left message requesting call back. Will try again later. 1st attempt

## 2020-07-16 ENCOUNTER — Other Ambulatory Visit: Payer: Self-pay

## 2020-07-16 ENCOUNTER — Ambulatory Visit (INDEPENDENT_AMBULATORY_CARE_PROVIDER_SITE_OTHER): Payer: BC Managed Care – PPO | Admitting: Family Medicine

## 2020-07-16 VITALS — BP 117/75 | HR 76 | Resp 16 | Ht 70.0 in | Wt 205.0 lb

## 2020-07-16 DIAGNOSIS — Z7689 Persons encountering health services in other specified circumstances: Secondary | ICD-10-CM | POA: Diagnosis not present

## 2020-07-16 DIAGNOSIS — I10 Essential (primary) hypertension: Secondary | ICD-10-CM

## 2020-07-16 NOTE — Patient Instructions (Addendum)
F/U in office with MD as before  Chem 7 and EGFR today  Weigh every day, must be 205  less pounds, if gain 3 pounds overnight , you are getting into heart failure trouble call  For help and stop doing the bad that you did to get there  Limit total fluid to 60 ounces every 24 hrs   Reduce salt in diet, avoid canned foods and salted meat  Stop alcohol  It is important that you exercise regularly at least 30 minutes 5 times a week. If you develop chest pain, have severe difficulty breathing, or feel very tired, stop exercising immediately and seek medical attention  Think about what you will eat, plan ahead. Choose " clean, green, fresh or frozen" over canned, processed or packaged foods which are more sugary, salty and fatty. 70 to 75% of food eaten should be vegetables and fruit. Three meals at set times with snacks allowed between meals, but they must be fruit or vegetables. Aim to eat over a 12 hour period , example 7 am to 7 pm, and STOP after  your last meal of the day. Drink water,generally about 64 ounces per day, no other drink is as healthy. Fruit juice is best enjoyed in a healthy way, by EATING the fruit. Thanks for choosing [facility], we consider it a privelige to serve you.

## 2020-07-16 NOTE — Telephone Encounter (Signed)
[id number]  Attempted to contact patient to complete [facility] telephone call. No answer. Left message requesting call back.  3rd attempt

## 2020-07-17 LAB — BMP8+EGFR
BUN/Creatinine Ratio: 29 — ABNORMAL HIGH (ref 10–24)
BUN: 53 mg/dL — ABNORMAL HIGH (ref 8–27)
CO2: 22 mmol/L (ref 20–29)
Calcium: 9.8 mg/dL (ref 8.6–10.2)
Chloride: 99 mmol/L (ref 96–106)
Creatinine, Ser: 1.8 mg/dL — ABNORMAL HIGH (ref 0.76–1.27)
GFR calc Af Amer: 46 mL/min/{1.73_m2} — ABNORMAL LOW (ref >59)
GFR calc non Af Amer: 40 mL/min/{1.73_m2} — ABNORMAL LOW (ref >59)
Glucose: 209 mg/dL — ABNORMAL HIGH (ref 65–99)
Potassium: 4.5 mmol/L (ref 3.5–5.2)
Sodium: 138 mmol/L (ref 134–144)

## 2020-07-18 ENCOUNTER — Telehealth: Payer: Self-pay

## 2020-07-18 DIAGNOSIS — N289 Disorder of kidney and ureter, unspecified: Secondary | ICD-10-CM

## 2020-07-18 NOTE — Progress Notes (Signed)
Patient aware and referral entered   

## 2020-07-18 NOTE — Telephone Encounter (Signed)
-----   Message from [name], MD sent at 07/17/2020 11:17 AM EDT ----- His potassium is normal.  His kidney function is approximately two thirds normal.  I am recommending that he eewnsures that hew drinks the 60 ounces of water dAILY, that is his limit , because of heart failure. I RECOMMEND ESTABLISHING WITH KIDNEY SPECIALIST ,  dR bUTANI has mild chronic kidney disease , if he agrees pls refer to dr Marylene Buerger , I will sign

## 2020-07-19 ENCOUNTER — Encounter: Payer: Self-pay | Admitting: Family Medicine

## 2020-07-19 NOTE — Assessment & Plan Note (Signed)
Patient in for follow up of recent hospitalization. Discharge summary, and laboratory and radiology data are reviewed, and any questions or concerns  are discussed. Specific issues requiring follow up are specifically addressed.  

## 2020-07-19 NOTE — Progress Notes (Signed)
   BUREN HAVEY     MRN: [id number]      DOB: [birth year]   HPI Mr. Waddell is here for follow up of recent hospitalization from 7/29 to 07/12/2020 because of acute CHF exacerbation Reports weighing daily and maintaining post d/c weight. Denies leg swelling, dyspnea or PND or orthopnea Hospital course, reason for repeat hospitalization and  Management of an general education re CHF is discussed in detail and questions answered  ROS Denies recent fever or chills. Denies sinus pressure, nasal congestion, ear pain or sore throat. Denies chest congestion, productive cough or wheezing. Denies chest pains, palpitations and leg swelling Denies abdominal pain, nausea, vomiting,diarrhea or constipation.   Denies dysuria, frequency, hesitancy or incontinence. Denies joint pain, swelling and limitation in mobility. Denies headaches, seizures, numbness, or tingling. Denies depression, anxiety or insomnia. Denies skin break down or rash.   PE  BP 117/75   Pulse 76   Resp 16   Ht 5\' 10"  (1.778 m)   Wt 205 lb (93 kg)   SpO2 97%   BMI 29.41 kg/m   Patient alert and oriented and in no cardiopulmonary distress.  HEENT: No facial asymmetry, EOMI,     Neck supple .  Chest: Clear to auscultation bilaterally.  CVS: S1, S2 no murmurs, no S3.Regular rate.  ABD: Soft non tender.   Ext: No edema  MS: Adequate ROM spine, shoulders, hips and knees.  Skin: Intact, no ulcerations or rash noted.  Psych: Good eye contact, normal affect. Memory intact not anxious or depressed appearing.  CNS: CN 2-12 intact, power,  normal throughout.no focal deficits noted.   [facility] discharge follow-up Patient in for follow up of recent hospitalization. Discharge summary, and laboratory and radiology data are reviewed, and any questions or concerns  are discussed. Specific issues requiring follow up are specifically addressed.   Encounter for support and coordination of  transition of care Patient in for follow up of recent hospitalization. Discharge summary, and laboratory and radiology data are reviewed, and any questions or concerns  are discussed. Specific issues requiring follow up are specifically addressed.

## 2020-07-22 ENCOUNTER — Telehealth: Payer: Self-pay

## 2020-07-22 ENCOUNTER — Other Ambulatory Visit: Payer: Self-pay | Admitting: Family Medicine

## 2020-07-22 ENCOUNTER — Telehealth: Payer: Self-pay | Admitting: Family Medicine

## 2020-07-22 MED ORDER — PREDNISONE 10 MG PO TABS: 10.0000 mg | ORAL_TABLET | Freq: Two times a day (BID) | ORAL | 0 refills | Status: DC

## 2020-07-22 NOTE — Telephone Encounter (Signed)
[name] (spouse) called for pt stating that the prednisone for the pt's hand isn't working and wanting to know if there is something else can be called in.

## 2020-07-22 NOTE — Telephone Encounter (Signed)
Correction --[name] called back and stated that it was the allopurinol (ZYLOPRIM) 300 MG tablet that was not working

## 2020-07-22 NOTE — Progress Notes (Unsigned)
[place]chicine

## 2020-07-22 NOTE — Telephone Encounter (Signed)
Pt was seen 07-16-20 what would you recommend?

## 2020-07-22 NOTE — Telephone Encounter (Signed)
I sent you the update

## 2020-07-22 NOTE — Telephone Encounter (Signed)
FYI I think betsy sent message as you were sending yours it is the allopurinol that is not working...recommendations?

## 2020-07-22 NOTE — Telephone Encounter (Signed)
Per your report the allopurinol is not working willsend in another short course of prednisone for 5 days and recommend Rheumatology to manage if perisits

## 2020-07-22 NOTE — Telephone Encounter (Signed)
Pt advised of prednisone sent in to pharmacy advised to call back if in 5 days if medication was no good and we would refer to rheumatolgy

## 2020-07-22 NOTE — Telephone Encounter (Signed)
With kidney and heart function not good, I have no other alternatives , except he should start the daily Allopurinol this will help to lower the uric acid

## 2020-07-23 ENCOUNTER — Ambulatory Visit: Payer: BC Managed Care – PPO | Admitting: "Endocrinology

## 2020-07-23 NOTE — Telephone Encounter (Signed)
Pt was called yesterday afternoon and updated

## 2020-07-25 ENCOUNTER — Encounter (HOSPITAL_COMMUNITY): Payer: Self-pay | Admitting: Internal Medicine

## 2020-07-25 ENCOUNTER — Ambulatory Visit (HOSPITAL_COMMUNITY)
Admission: RE | Admit: 2020-07-25 | Discharge: 2020-07-25 | Disposition: A | Discharge status: Home / Self Care | Payer: BC Managed Care – PPO | Source: Ambulatory Visit | Attending: Internal Medicine | Admitting: Internal Medicine

## 2020-07-25 ENCOUNTER — Other Ambulatory Visit: Payer: Self-pay

## 2020-07-25 ENCOUNTER — Other Ambulatory Visit (HOSPITAL_COMMUNITY): Payer: Self-pay | Admitting: *Deleted

## 2020-07-25 VITALS — BP 105/70 | HR 81 | Wt 212.4 lb

## 2020-07-25 DIAGNOSIS — E782 Mixed hyperlipidemia: Secondary | ICD-10-CM | POA: Insufficient documentation

## 2020-07-25 DIAGNOSIS — I252 Old myocardial infarction: Secondary | ICD-10-CM | POA: Insufficient documentation

## 2020-07-25 DIAGNOSIS — Z95810 Presence of automatic (implantable) cardiac defibrillator: Secondary | ICD-10-CM | POA: Diagnosis not present

## 2020-07-25 DIAGNOSIS — N183 Chronic kidney disease, stage 3 unspecified: Secondary | ICD-10-CM | POA: Diagnosis not present

## 2020-07-25 DIAGNOSIS — I2510 Atherosclerotic heart disease of native coronary artery without angina pectoris: Secondary | ICD-10-CM | POA: Insufficient documentation

## 2020-07-25 DIAGNOSIS — I130 Hypertensive heart and chronic kidney disease with heart failure and stage 1 through stage 4 chronic kidney disease, or unspecified chronic kidney disease: Secondary | ICD-10-CM | POA: Diagnosis not present

## 2020-07-25 DIAGNOSIS — Z87891 Personal history of nicotine dependence: Secondary | ICD-10-CM | POA: Insufficient documentation

## 2020-07-25 DIAGNOSIS — F1010 Alcohol abuse, uncomplicated: Secondary | ICD-10-CM | POA: Insufficient documentation

## 2020-07-25 DIAGNOSIS — I5042 Chronic combined systolic (congestive) and diastolic (congestive) heart failure: Secondary | ICD-10-CM | POA: Insufficient documentation

## 2020-07-25 DIAGNOSIS — F419 Anxiety disorder, unspecified: Secondary | ICD-10-CM | POA: Diagnosis not present

## 2020-07-25 DIAGNOSIS — Z9989 Dependence on other enabling machines and devices: Secondary | ICD-10-CM | POA: Diagnosis not present

## 2020-07-25 DIAGNOSIS — Z7952 Long term (current) use of systemic steroids: Secondary | ICD-10-CM | POA: Insufficient documentation

## 2020-07-25 DIAGNOSIS — I5022 Chronic systolic (congestive) heart failure: Secondary | ICD-10-CM

## 2020-07-25 DIAGNOSIS — J449 Chronic obstructive pulmonary disease, unspecified: Secondary | ICD-10-CM | POA: Insufficient documentation

## 2020-07-25 DIAGNOSIS — G4733 Obstructive sleep apnea (adult) (pediatric): Secondary | ICD-10-CM | POA: Diagnosis not present

## 2020-07-25 DIAGNOSIS — M109 Gout, unspecified: Secondary | ICD-10-CM | POA: Diagnosis not present

## 2020-07-25 DIAGNOSIS — E1165 Type 2 diabetes mellitus with hyperglycemia: Secondary | ICD-10-CM | POA: Insufficient documentation

## 2020-07-25 DIAGNOSIS — Z6830 Body mass index (BMI) 30.0-30.9, adult: Secondary | ICD-10-CM | POA: Diagnosis not present

## 2020-07-25 DIAGNOSIS — Z7982 Long term (current) use of aspirin: Secondary | ICD-10-CM | POA: Insufficient documentation

## 2020-07-25 DIAGNOSIS — E1122 Type 2 diabetes mellitus with diabetic chronic kidney disease: Secondary | ICD-10-CM | POA: Insufficient documentation

## 2020-07-25 DIAGNOSIS — Z955 Presence of coronary angioplasty implant and graft: Secondary | ICD-10-CM | POA: Insufficient documentation

## 2020-07-25 DIAGNOSIS — N1831 Chronic kidney disease, stage 3a: Secondary | ICD-10-CM | POA: Insufficient documentation

## 2020-07-25 DIAGNOSIS — E669 Obesity, unspecified: Secondary | ICD-10-CM | POA: Insufficient documentation

## 2020-07-25 DIAGNOSIS — I340 Nonrheumatic mitral (valve) insufficiency: Secondary | ICD-10-CM | POA: Diagnosis not present

## 2020-07-25 DIAGNOSIS — Z79899 Other long term (current) drug therapy: Secondary | ICD-10-CM | POA: Diagnosis not present

## 2020-07-25 DIAGNOSIS — Z794 Long term (current) use of insulin: Secondary | ICD-10-CM | POA: Diagnosis not present

## 2020-07-25 DIAGNOSIS — Z833 Family history of diabetes mellitus: Secondary | ICD-10-CM | POA: Insufficient documentation

## 2020-07-25 DIAGNOSIS — Z8249 Family history of ischemic heart disease and other diseases of the circulatory system: Secondary | ICD-10-CM | POA: Insufficient documentation

## 2020-07-25 LAB — COMPREHENSIVE METABOLIC PANEL
ALT: 35 U/L (ref 0–44)
AST: 25 U/L (ref 15–41)
Albumin: 3.3 g/dL — ABNORMAL LOW (ref 3.5–5.0)
Alkaline Phosphatase: 78 U/L (ref 38–126)
Anion gap: 12 (ref 5–15)
BUN: 35 mg/dL — ABNORMAL HIGH (ref 6–20)
CO2: 24 mmol/L (ref 22–32)
Calcium: 9.4 mg/dL (ref 8.9–10.3)
Chloride: 102 mmol/L (ref 98–111)
Creatinine, Ser: 1.47 mg/dL — ABNORMAL HIGH (ref 0.61–1.24)
GFR calc Af Amer: 59 mL/min — ABNORMAL LOW (ref >60)
GFR calc non Af Amer: 51 mL/min — ABNORMAL LOW (ref >60)
Glucose, Bld: 163 mg/dL — ABNORMAL HIGH (ref 70–99)
Potassium: 4.1 mmol/L (ref 3.5–5.1)
Sodium: 138 mmol/L (ref 135–145)
Total Bilirubin: 0.6 mg/dL (ref 0.3–1.2)
Total Protein: 6.9 g/dL (ref 6.5–8.1)

## 2020-07-25 LAB — BRAIN NATRIURETIC PEPTIDE: B Natriuretic Peptide: 268.1 pg/mL — ABNORMAL HIGH (ref 0.0–100.0)

## 2020-07-25 MED ORDER — DAPAGLIFLOZIN PROPANEDIOL 10 MG PO TABS
10.0000 mg | ORAL_TABLET | Freq: Every day | ORAL | 3 refills | Status: AC
Start: 2020-07-25 — End: ?

## 2020-07-25 MED ORDER — SODIUM CHLORIDE 0.9% FLUSH: 3.0000 mL | Freq: Two times a day (BID) | INTRAVENOUS | Status: DC

## 2020-07-25 NOTE — Progress Notes (Signed)
[facility] CLINIC CONSULT NOTE  Referring Physician: Branch Primary Care: Tula Nakayama Primary Cardiologist: Harl Bowie  HPI:  Antonio Cobb is a 59 y.o. male with CAD(s/p STEMI in 2013 with DES to LCx, patent by repeat cath in 05/2016 with moderate residual CAD along RCA and LAD), chronic combined systolic and diastolic CHF (EF 24-40% by echo in 09/2016, 25-30% in 08/2017 and 20% by most recent echo in 04/2020, s/p ICD placement in 01/2018), HTN,HLD, IDDM and OSA (on CPAP) who presents to the office today for hospital follow-up.   He was admitted to [facility] in 04/2020 for a CHF exacerbation and responded quickly with IV Lasix. Repeat echo did show his EF remained reduced at 20% and he had Grade 2 DD. RV function was normal. His outpatient Torsemide was increased to 60mg  daily and follow-up was arranged for 2 weeks but he canceled the visit.   He was again readmitted from 6/27 - 06/10/2020 for flash pulmonary edema as BNP was elevated to 519 and CXR was consistent with vascular congestion. He again responded quickly with IV Lasix and was discharged the following morning. Torsemide 60mg  daily was resumed and weight at the time of discharge was 209 lbs.   Readmitted 7/29-7/30/21 with recurrent volume overload and SOB. Brought to ED and required BIPAP. Hstrop 21 and 23. Improved quickly with IV lasix   He is here with his wife. Says he is feeling really good - best he has felt in years. Works as a Dealer. Denies CP, dyspnea, orthopnea or PND. Can go up 14 steps. Weight stable around 205. Got a little dizzy recently so decreased torsemide from 60 daily to 40 daily. Wearing CPAP every night. Drinks 12 oz beer and about 3shots of rum on most days. Had gout recently and sugars were up from steroids.   ICD interrogated in clinic. 1 run NSVT. Impedance way up and now coming back down (dry) Personally reviewed   Review of Systems: [y] = yes, [ ]  = no   General: Weight gain [ ] ; Weight  loss [ ] ; Anorexia [ ] ; Fatigue [ ] ; Fever [ ] ; Chills [ ] ; Weakness [ ]   Cardiac: Chest pain/pressure [ ] ; Resting SOB [ ] ; Exertional SOB [ ] ; Orthopnea [ ] ; Pedal Edema [ ] ; Palpitations [ ] ; Syncope [ ] ; Presyncope [ ] ; Paroxysmal nocturnal dyspnea[ ]   Pulmonary: Cough [ ] ; Wheezing[ ] ; Hemoptysis[ ] ; Sputum [ ] ; Snoring [ ]   GI: Vomiting[ ] ; Dysphagia[ ] ; Melena[ ] ; Hematochezia [ ] ; Heartburn[ ] ; Abdominal pain [ ] ; Constipation [ ] ; Diarrhea [ ] ; BRBPR [ ]   GU: Hematuria[ ] ; Dysuria [ ] ; Nocturia[ ]   Vascular: Pain in legs with walking [ ] ; Pain in feet with lying flat [ ] ; Non-healing sores [ ] ; Stroke [ ] ; TIA [ ] ; Slurred speech [ ] ;  Neuro: Headaches[ ] ; Vertigo[ ] ; Seizures[ ] ; Paresthesias[ ] ;Blurred vision [ ] ; Diplopia [ ] ; Vision changes [ ]   Ortho/Skin: Arthritis Blue.Reese ]; Joint pain Blue.Reese ]; Muscle pain [ ] ; Joint swelling [ ] ; Back Pain [ ] ; Rash [ ]   Psych: Depression[ ] ; Anxiety[ ]   Heme: Bleeding problems [ ] ; Clotting disorders [ ] ; Anemia [ ]   Endocrine: Diabetes Blue.Reese ]; Thyroid dysfunction[ ]    Past Medical History:  Diagnosis Date  . Acute CHF (congestive heart failure) ([place]) 01/21/2020  . Acute on chronic combined systolic and diastolic CHF (congestive heart failure) ([place])   . Acute respiratory failure with hypoxia and hypercapnia ([facility])   .  Acute systolic CHF (congestive heart failure) ([place]) 05/08/2020  . Alcohol abuse   . CAD (coronary artery disease)    a. s/p inferolateral STEMI in 05/2012 with DES to LCx b. patent stent by cath in 05/2016 with moderate RCA and LAD disease  . Cardiomyopathy ([place])    a. LVEF 30-35% by echo in 09/2016. b. 08/2017: echo showing EF remains reduced at 25-30% c. s/p [employer] ICD placement in 01/2018  . CHF (congestive heart failure) ([place])   . CHF exacerbation ([place]) 11/18/2019  . COPD (chronic obstructive pulmonary disease) ([place])   . Diabetes mellitus, type 2 ([place])   . Essential hypertension   . Hypertensive urgency   . Mitral  regurgitation    Moderate  . Mixed hyperlipidemia   . Myocardial infarction ([place]) 05/23/2012   Inferolateral STEMI  . Obesity     Current Outpatient Medications  Medication Sig Dispense Refill  . allopurinol (ZYLOPRIM) 300 MG tablet Take 1 tablet (300 mg total) by mouth daily. 30 tablet 6  . aspirin 81 MG EC tablet Take 1 tablet (81 mg total) by mouth daily with breakfast. 90 tablet 3  . docusate sodium (COLACE) 100 MG capsule Take 100 mg by mouth daily.    [name] esomeprazole (NEXIUM) 20 MG capsule Take 20 mg by mouth daily at 12 noon.    . famotidine (PEPCID) 20 MG tablet Take 1 tablet (20 mg total) by mouth at bedtime.    [name] glipiZIDE (GLUCOTROL XL) 5 MG 24 hr tablet Take 1 tablet (5 mg total) by mouth daily with breakfast. 30 tablet 3  . hydrOXYzine (ATARAX/VISTARIL) 25 MG tablet Take 25 mg by mouth at bedtime as needed.    . insulin glargine (LANTUS SOLOSTAR) 100 UNIT/ML Solostar Pen Inject 60 Units into the skin at bedtime. 10 pen 2  . losartan (COZAAR) 25 MG tablet Take 1 tablet (25 mg total) by mouth daily. 30 tablet 5  . Melatonin 10 MG TABS Take 2 tablets by mouth at bedtime as needed (for sleep).     . metoprolol succinate (TOPROL-XL) 50 MG 24 hr tablet Take 1 tablet (50 mg total) by mouth daily. TAKE 1 TABLET BY MOUTH EVERY DAY WITH FOOD 90 tablet 3  . Multiple Vitamins-Minerals (MULTIVITAMIN WITH MINERALS) tablet Take 1 tablet by mouth daily. 120 tablet 2  . potassium chloride SA (KLOR-CON) 20 MEQ tablet Take 1 tablet (20 mEq total) by mouth daily. 90 tablet 3  . predniSONE (DELTASONE) 10 MG tablet Take 1 tablet (10 mg total) by mouth 2 (two) times daily with a meal. 10 tablet 0  . rosuvastatin (CRESTOR) 40 MG tablet Take 1 tablet (40 mg total) by mouth daily. 30 tablet 3  . sertraline (ZOLOFT) 25 MG tablet Take 1 tablet (25 mg total) by mouth daily. For anxiety/panic attacks 30 tablet 5  . spironolactone (ALDACTONE) 25 MG tablet Take 1 tablet (25 mg total) by mouth daily. 90 tablet  3  . torsemide (DEMADEX) 20 MG tablet Take 40 mg by mouth daily.    . Vitamin D, Ergocalciferol, (DRISDOL) 1.25 MG (50000 UNIT) CAPS capsule Take 1 capsule (50,000 Units total) by mouth every 7 (seven) days. 8 capsule 0  . dapagliflozin propanediol (FARXIGA) 10 MG TABS tablet Take 1 tablet (10 mg total) by mouth daily before breakfast. 30 tablet 3   No current facility-administered medications for this encounter.    Allergies  Allergen Reactions  . Entresto [Sacubitril-Valsartan]     Dizziness, Coughing  . Other  Seasonal allergies  - has to use inhaler      Social History   Socioeconomic History  . Marital status: Married    Spouse name: Not on file  . Number of children: Not on file  . Years of education: Not on file  . Highest education level: Not on file  Occupational History  . Occupation: Therapist, sports: [employer]  Tobacco Use  . Smoking status: Former Smoker    Packs/day: 0.25    Years: 30.00    Pack years: 7.50    Types: Cigars    Quit date: 05/23/2016    Years since quitting: 4.1  . Smokeless tobacco: Never Used  . Tobacco comment: since early 52s  Vaping Use  . Vaping Use: Never used  Substance and Sexual Activity  . Alcohol use: Yes    Alcohol/week: 0.0 standard drinks    Comment: occ  . Drug use: No  . Sexual activity: Not Currently  Other Topics Concern  . Not on file  Social History Narrative  . Not on file   Social Determinants of Health   Financial Resource Strain:   . Difficulty of Paying Living Expenses:   Food Insecurity:   . Worried About Charity fundraiser in the Last Year:   . Arboriculturist in the Last Year:   Transportation Needs:   . Film/video editor (Medical):   [name] Lack of Transportation (Non-Medical):   Physical Activity:   . Days of Exercise per Week:   . Minutes of Exercise per Session:   Stress:   . Feeling of Stress :   Social Connections:   . Frequency of Communication with Friends and Family:    . Frequency of Social Gatherings with Friends and Family:   . Attends Religious Services:   . Active Member of Clubs or Organizations:   . Attends Archivist Meetings:   [name] Marital Status:   Intimate Partner Violence:   . Fear of Current or Ex-Partner:   . Emotionally Abused:   [name] Physically Abused:   . Sexually Abused:       Family History  Problem Relation Age of Onset  . Emphysema Mother   . Heart attack Mother   . Hypertension Mother   . Diabetes Mother   . Heart disease Mother   . COPD Mother   . Emphysema Father   . COPD Father   . Stroke Sister   . Asthma Sister   . Colon cancer Neg Hx   . Anesthesia problems Neg Hx   . Hypotension Neg Hx   . Malignant hyperthermia Neg Hx   . Pseudochol deficiency Neg Hx     Vitals:   07/25/20 1122  BP: 105/70  Pulse: 81  SpO2: 97%  Weight: 96.3 kg (212 lb 6.4 oz)    PHYSICAL EXAM: General:  Well appearing. No respiratory difficulty HEENT: normal Neck: supple. JVP 7 Carotids 2+ bilat; no bruits. No lymphadenopathy or thryomegaly appreciated. Cor: PMI nondisplaced. Regular rate & rhythm. No rubs, gallops or murmurs. Lungs: clear Abdomen: soft, nontender, nondistended. No hepatosplenomegaly. No bruits or masses. Good bowel sounds. Extremities: no cyanosis, clubbing, rash, edema Neuro: alert & oriented x 3, cranial nerves grossly intact. moves all 4 extremities w/o difficulty. Affect pleasant.    ASSESSMENT & PLAN: 1. Chronic Combined Systolic and Diastolic CHF - suspect iCM s/p LCX STEMI in 2013  - EF 30-35% by echo in 2017.  (cardiomyopathy felt to  be out of proportion to his CAD at that time and possibly alcohol induced)  - Echo 5/21 EF 20% - s/p BSci ICD placement in 2019. - NYHA I-II - Volume status looks ok on torsemide 40 daily (was dry on 60 daily per ICD) - Continue Losartan 25 daily (failed Entresto due to cough per his report) - Continue Toprol 50 daily - Continue Arlyce Harman 25 daily  - Start Farxiga  10 daily - if dizzy cut torsemide to 20 daily - Reinforced need for daily weights and reviewed use of sliding scale diuretics. - Long talk about his situation and I told him it was unclear why his EF was getting worse. We talked about various etiologies. Ideally I would pursue cMRI to further evaluate for scar or infiltrative disease but ICD prohibits - Will start with repeat cath to exclude progressive CAD over past 4+ years. Can also consider PYP. We also discussed that ETOH may be an issue and I urged him to start cutting back and to try to stop completely - Discussed possible cardiomems as needed. Will enroll in ICD program with Sharman Cheek   2. CAD - He is s/p STEMI in 2013 with DES to LCx, patent by repeat cath in 05/2016 with moderate residual CAD along RCA and LAD. - Plan R/L cath as above - Continue current medication regimen with ASA 81 mg daily, Toprol-XL 50 mg daily and Crestor 40 mg daily. - Have added SGLT2i  3. Mitral Regurgitation -This was mild by most recent echocardiogram in 04/2020.  4. HTN - BP controlled  5. OSA - Continued compliance with CPAP encouraged.   6.  CKD 3a - Baseline creatinine 1.3 - 1.4. Peaked at 1.8 recently - Recheck today. Avoid overdiuresis   7. ETOH abuse - encouraged cessation as above  8. DM2, uncontrolled - Hgba1c 10.2 - starting Rhodia Albright, MD  12:32 PM

## 2020-07-25 NOTE — H&P (View-Only) (Signed)
ADVANCED HF CLINIC CONSULT NOTE  Referring Physician: Branch Primary Care: Tula Nakayama Primary Cardiologist: Harl Bowie  HPI:  Antonio Cobb is a 59 y.o. male with CAD(s/p STEMI in 2013 with DES to LCx, patent by repeat cath in 05/2016 with moderate residual CAD along RCA and LAD), chronic combined systolic and diastolic CHF (EF 42-87% by echo in 09/2016, 25-30% in 08/2017 and 20% by most recent echo in 04/2020, s/p ICD placement in 01/2018), HTN,HLD, IDDM and OSA (on CPAP) who presents to the office today for hospital follow-up.   He was admitted to [facility] in 04/2020 for a CHF exacerbation and responded quickly with IV Lasix. Repeat echo did show his EF remained reduced at 20% and he had Grade 2 DD. RV function was normal. His outpatient Torsemide was increased to 60mg  daily and follow-up was arranged for 2 weeks but he canceled the visit.   He was again readmitted from 6/27 - 06/10/2020 for flash pulmonary edema as BNP was elevated to 519 and CXR was consistent with vascular congestion. He again responded quickly with IV Lasix and was discharged the following morning. Torsemide 60mg  daily was resumed and weight at the time of discharge was 209 lbs.   Readmitted 7/29-7/30/21 with recurrent volume overload and SOB. Brought to ED and required BIPAP. Hstrop 21 and 23. Improved quickly with IV lasix   He is here with his wife. Says he is feeling really good - best he has felt in years. Works as a Dealer. Denies CP, dyspnea, orthopnea or PND. Can go up 14 steps. Weight stable around 205. Got a little dizzy recently so decreased torsemide from 60 daily to 40 daily. Wearing CPAP every night. Drinks 12 oz beer and about 3shots of rum on most days. Had gout recently and sugars were up from steroids.   ICD interrogated in clinic. 1 run NSVT. Impedance way up and now coming back down (dry) Personally reviewed   Review of Systems: [y] = yes, [ ]  = no   General: Weight gain [ ] ; Weight  loss [ ] ; Anorexia [ ] ; Fatigue [ ] ; Fever [ ] ; Chills [ ] ; Weakness [ ]   Cardiac: Chest pain/pressure [ ] ; Resting SOB [ ] ; Exertional SOB [ ] ; Orthopnea [ ] ; Pedal Edema [ ] ; Palpitations [ ] ; Syncope [ ] ; Presyncope [ ] ; Paroxysmal nocturnal dyspnea[ ]   Pulmonary: Cough [ ] ; Wheezing[ ] ; Hemoptysis[ ] ; Sputum [ ] ; Snoring [ ]   GI: Vomiting[ ] ; Dysphagia[ ] ; Melena[ ] ; Hematochezia [ ] ; Heartburn[ ] ; Abdominal pain [ ] ; Constipation [ ] ; Diarrhea [ ] ; BRBPR [ ]   GU: Hematuria[ ] ; Dysuria [ ] ; Nocturia[ ]   Vascular: Pain in legs with walking [ ] ; Pain in feet with lying flat [ ] ; Non-healing sores [ ] ; Stroke [ ] ; TIA [ ] ; Slurred speech [ ] ;  Neuro: Headaches[ ] ; Vertigo[ ] ; Seizures[ ] ; Paresthesias[ ] ;Blurred vision [ ] ; Diplopia [ ] ; Vision changes [ ]   Ortho/Skin: Arthritis Blue.Reese ]; Joint pain Blue.Reese ]; Muscle pain [ ] ; Joint swelling [ ] ; Back Pain [ ] ; Rash [ ]   Psych: Depression[ ] ; Anxiety[ ]   Heme: Bleeding problems [ ] ; Clotting disorders [ ] ; Anemia [ ]   Endocrine: Diabetes Blue.Reese ]; Thyroid dysfunction[ ]    Past Medical History:  Diagnosis Date   Acute CHF (congestive heart failure) ([place]) 01/21/2020   Acute on chronic combined systolic and diastolic CHF (congestive heart failure) ([place])    Acute respiratory failure with hypoxia and hypercapnia ([place])  Acute systolic CHF (congestive heart failure) ([place]) 05/08/2020   Alcohol abuse    CAD (coronary artery disease)    a. s/p inferolateral STEMI in 05/2012 with DES to LCx b. patent stent by cath in 05/2016 with moderate RCA and LAD disease   Cardiomyopathy ([place])    a. LVEF 30-35% by echo in 09/2016. b. 08/2017: echo showing EF remains reduced at 25-30% c. s/p [employer] ICD placement in 01/2018   CHF (congestive heart failure) ([facility])    CHF exacerbation ([place]) 11/18/2019   COPD (chronic obstructive pulmonary disease) ([place])    Diabetes mellitus, type 2 ([place])    Essential hypertension    Hypertensive urgency    Mitral  regurgitation    Moderate   Mixed hyperlipidemia    Myocardial infarction ([place]) 05/23/2012   Inferolateral STEMI   Obesity     Current Outpatient Medications  Medication Sig Dispense Refill   allopurinol (ZYLOPRIM) 300 MG tablet Take 1 tablet (300 mg total) by mouth daily. 30 tablet 6   aspirin 81 MG EC tablet Take 1 tablet (81 mg total) by mouth daily with breakfast. 90 tablet 3   docusate sodium (COLACE) 100 MG capsule Take 100 mg by mouth daily.     esomeprazole (NEXIUM) 20 MG capsule Take 20 mg by mouth daily at 12 noon.     famotidine (PEPCID) 20 MG tablet Take 1 tablet (20 mg total) by mouth at bedtime.     glipiZIDE (GLUCOTROL XL) 5 MG 24 hr tablet Take 1 tablet (5 mg total) by mouth daily with breakfast. 30 tablet 3   hydrOXYzine (ATARAX/VISTARIL) 25 MG tablet Take 25 mg by mouth at bedtime as needed.     insulin glargine (LANTUS SOLOSTAR) 100 UNIT/ML Solostar Pen Inject 60 Units into the skin at bedtime. 10 pen 2   losartan (COZAAR) 25 MG tablet Take 1 tablet (25 mg total) by mouth daily. 30 tablet 5   Melatonin 10 MG TABS Take 2 tablets by mouth at bedtime as needed (for sleep).      metoprolol succinate (TOPROL-XL) 50 MG 24 hr tablet Take 1 tablet (50 mg total) by mouth daily. TAKE 1 TABLET BY MOUTH EVERY DAY WITH FOOD 90 tablet 3   Multiple Vitamins-Minerals (MULTIVITAMIN WITH MINERALS) tablet Take 1 tablet by mouth daily. 120 tablet 2   potassium chloride SA (KLOR-CON) 20 MEQ tablet Take 1 tablet (20 mEq total) by mouth daily. 90 tablet 3   predniSONE (DELTASONE) 10 MG tablet Take 1 tablet (10 mg total) by mouth 2 (two) times daily with a meal. 10 tablet 0   rosuvastatin (CRESTOR) 40 MG tablet Take 1 tablet (40 mg total) by mouth daily. 30 tablet 3   sertraline (ZOLOFT) 25 MG tablet Take 1 tablet (25 mg total) by mouth daily. For anxiety/panic attacks 30 tablet 5   spironolactone (ALDACTONE) 25 MG tablet Take 1 tablet (25 mg total) by mouth daily. 90 tablet  3   torsemide (DEMADEX) 20 MG tablet Take 40 mg by mouth daily.     Vitamin D, Ergocalciferol, (DRISDOL) 1.25 MG (50000 UNIT) CAPS capsule Take 1 capsule (50,000 Units total) by mouth every 7 (seven) days. 8 capsule 0   dapagliflozin propanediol (FARXIGA) 10 MG TABS tablet Take 1 tablet (10 mg total) by mouth daily before breakfast. 30 tablet 3   No current facility-administered medications for this encounter.    Allergies  Allergen Reactions   Entresto [Sacubitril-Valsartan]     Dizziness, Coughing   Other  Seasonal allergies  - has to use inhaler      Social History   Socioeconomic History   Marital status: Married    Spouse name: Not on file   Number of children: Not on file   Years of education: Not on file   Highest education level: Not on file  Occupational History   Occupation: Therapist, sports: [employer]  Tobacco Use   Smoking status: Former Smoker    Packs/day: 0.25    Years: 30.00    Pack years: 7.50    Types: Cigars    Quit date: 05/23/2016    Years since quitting: 4.1   Smokeless tobacco: Never Used   Tobacco comment: since early 60s  Vaping Use   Vaping Use: Never used  Substance and Sexual Activity   Alcohol use: Yes    Alcohol/week: 0.0 standard drinks    Comment: occ   Drug use: No   Sexual activity: Not Currently  Other Topics Concern   Not on file  Social History Narrative   Not on file   Social Determinants of Health   Financial Resource Strain:    Difficulty of Paying Living Expenses:   Food Insecurity:    Worried About Charity fundraiser in the Last Year:    Arboriculturist in the Last Year:   Transportation Needs:    Film/video editor (Medical):    Lack of Transportation (Non-Medical):   Physical Activity:    Days of Exercise per Week:    Minutes of Exercise per Session:   Stress:    Feeling of Stress :   Social Connections:    Frequency of Communication with Friends and Family:     Frequency of Social Gatherings with Friends and Family:    Attends Religious Services:    Active Member of Clubs or Organizations:    Attends Music therapist:    Marital Status:   Intimate Partner Violence:    Fear of Current or Ex-Partner:    Emotionally Abused:    Physically Abused:    Sexually Abused:       Family History  Problem Relation Age of Onset   Emphysema Mother    Heart attack Mother    Hypertension Mother    Diabetes Mother    Heart disease Mother    COPD Mother    Emphysema Father    COPD Father    Stroke Sister    Asthma Sister    Colon cancer Neg Hx    Anesthesia problems Neg Hx    Hypotension Neg Hx    Malignant hyperthermia Neg Hx    Pseudochol deficiency Neg Hx     Vitals:   07/25/20 1122  BP: 105/70  Pulse: 81  SpO2: 97%  Weight: 96.3 kg (212 lb 6.4 oz)    PHYSICAL EXAM: General:  Well appearing. No respiratory difficulty HEENT: normal Neck: supple. JVP 7 Carotids 2+ bilat; no bruits. No lymphadenopathy or thryomegaly appreciated. Cor: PMI nondisplaced. Regular rate & rhythm. No rubs, gallops or murmurs. Lungs: clear Abdomen: soft, nontender, nondistended. No hepatosplenomegaly. No bruits or masses. Good bowel sounds. Extremities: no cyanosis, clubbing, rash, edema Neuro: alert & oriented x 3, cranial nerves grossly intact. moves all 4 extremities w/o difficulty. Affect pleasant.    ASSESSMENT & PLAN: 1. Chronic Combined Systolic and Diastolic CHF - suspect iCM s/p LCX STEMI in 2013  - EF 30-35% by echo in 2017.  (cardiomyopathy felt to  be out of proportion to his CAD at that time and possibly alcohol induced)  - Echo 5/21 EF 20% - s/p BSci ICD placement in 2019. - NYHA I-II - Volume status looks ok on torsemide 40 daily (was dry on 60 daily per ICD) - Continue Losartan 25 daily (failed Entresto due to cough per his report) - Continue Toprol 50 daily - Continue Arlyce Harman 25 daily  - Start Farxiga  10 daily - if dizzy cut torsemide to 20 daily - Reinforced need for daily weights and reviewed use of sliding scale diuretics. - Long talk about his situation and I told him it was unclear why his EF was getting worse. We talked about various etiologies. Ideally I would pursue cMRI to further evaluate for scar or infiltrative disease but ICD prohibits - Will start with repeat cath to exclude progressive CAD over past 4+ years. Can also consider PYP. We also discussed that ETOH may be an issue and I urged him to start cutting back and to try to stop completely - Discussed possible cardiomems as needed. Will enroll in ICD program with Sharman Cheek   2. CAD - He is s/p STEMI in 2013 with DES to LCx, patent by repeat cath in 05/2016 with moderate residual CAD along RCA and LAD. - Plan R/L cath as above - Continue current medication regimen with ASA 81 mg daily, Toprol-XL 50 mg daily and Crestor 40 mg daily. - Have added SGLT2i  3. Mitral Regurgitation -This was mild by most recent echocardiogram in 04/2020.  4. HTN - BP controlled  5. OSA - Continued compliance with CPAP encouraged.   6.  CKD 3a - Baseline creatinine 1.3 - 1.4. Peaked at 1.8 recently - Recheck today. Avoid overdiuresis   7. ETOH abuse - encouraged cessation as above  8. DM2, uncontrolled - Hgba1c 10.2 - starting Rhodia Albright, MD  12:32 PM

## 2020-07-25 NOTE — Patient Instructions (Addendum)
Lab work done today. We will notify you of any abnormal lab work. No news is good news!  START Farxiga 10mg  tab daily.  CONTINUE Torsemide 40mg  daily. You may take an extra 20mg  tab as needed for swelling.    [facility] [place] [id number] [place] [id number] Dept: [phone] Loc: [phone]  Antonio Cobb  07/25/2020  You are scheduled for a Cardiac Catheterization on Tuesday, August 24 with Dr. Glori Bickers.  1. Please arrive at the [facility] (Main Entrance A) at [facility]: [address] [place], [place] [address] at 5:30 AM (This time is two hours before your procedure to ensure your preparation). Free valet parking service is available.   Special note: Every effort is made to have your procedure done on time. Please understand that emergencies sometimes delay scheduled procedures.  2. Diet: Do not eat solid foods after midnight.  The patient may have clear liquids until 5am upon the day of the procedure.  3. Labs: You will need to have a COVID 19 test no later than 08/03/20.    4. Medication instructions in preparation for your procedure:   Contrast Allergy: No   DO NOT TAKE THE MORNING OF: Torsemide and Spironolactone.  Take only 30 units of insulin the night before your procedure. Do not take any insulin on the day of the procedure.    On the morning of your procedure, take your Aspirin and any morning medicines NOT listed above.  You may use sips of water.  5. Plan for one night stay--bring personal belongings. 6. Bring a current list of your medications and current insurance cards. 7. You MUST have a responsible person to drive you home. 8. Someone MUST be with you the first 24 hours after you arrive home or your discharge will be delayed. 9. Please wear clothes that are easy to get on and off and wear slip-on shoes.  Thank you for allowing [place] to  care for you!   -- [facility]

## 2020-07-26 ENCOUNTER — Telehealth: Payer: Self-pay

## 2020-07-26 NOTE — Telephone Encounter (Signed)
-----   Message from Jolaine Artist, MD sent at 07/25/2020 12:33 PM EDT -----  Leonie Man. Can you enroll Mr. Bartoli in your ICM volume monitoring program?   Thanks

## 2020-07-26 NOTE — Telephone Encounter (Signed)
Patient referred to [facility] clinic by Dr Haroldine Laws.  Attempted call to patient and left message with contact number to return call.

## 2020-07-29 ENCOUNTER — Other Ambulatory Visit: Payer: Self-pay

## 2020-07-29 DIAGNOSIS — R29898 Other symptoms and signs involving the musculoskeletal system: Secondary | ICD-10-CM

## 2020-08-02 ENCOUNTER — Other Ambulatory Visit: Payer: Self-pay

## 2020-08-02 ENCOUNTER — Other Ambulatory Visit (HOSPITAL_COMMUNITY)
Admission: RE | Admit: 2020-08-02 | Discharge: 2020-08-02 | Disposition: A | Discharge status: Home / Self Care | Payer: BC Managed Care – PPO | Source: Ambulatory Visit | Attending: Internal Medicine | Admitting: Internal Medicine

## 2020-08-02 DIAGNOSIS — Z01812 Encounter for preprocedural laboratory examination: Secondary | ICD-10-CM | POA: Diagnosis not present

## 2020-08-02 DIAGNOSIS — Z20822 Contact with and (suspected) exposure to covid-19: Secondary | ICD-10-CM | POA: Insufficient documentation

## 2020-08-03 LAB — SARS CORONAVIRUS 2 (TAT 6-24 HRS): SARS Coronavirus 2: NEGATIVE

## 2020-08-05 NOTE — Telephone Encounter (Signed)
Spoke with patient and provided ICM intro. Patient agreed to monthly ICM follow up.  Explained remote transmission will be sent automatically as long as he has the monitor by his bedside.  He said he is doing very well since starting [place].  He feels good at this time and weight is stable at 205 lbs.  Provided ICM number and encouraged to call for any fluid symptoms. He is scheduled for a heart catheretization tomorrow. 1st ICM remote transmission scheduled 08/15/2020.

## 2020-08-06 ENCOUNTER — Encounter (HOSPITAL_COMMUNITY)
Admission: RE | Disposition: A | Discharge status: Home / Self Care | Payer: Self-pay | Source: Home / Self Care | Attending: Internal Medicine

## 2020-08-06 ENCOUNTER — Other Ambulatory Visit: Payer: Self-pay

## 2020-08-06 ENCOUNTER — Ambulatory Visit: Payer: BC Managed Care – PPO | Admitting: "Endocrinology

## 2020-08-06 ENCOUNTER — Ambulatory Visit (HOSPITAL_COMMUNITY)
Admission: RE | Admit: 2020-08-06 | Discharge: 2020-08-06 | Disposition: A | Discharge status: Home / Self Care | Payer: BC Managed Care – PPO | Attending: Internal Medicine | Admitting: Internal Medicine

## 2020-08-06 ENCOUNTER — Ambulatory Visit (HOSPITAL_COMMUNITY)
Admission: RE | Admit: 2020-08-06 | Payer: BC Managed Care – PPO | Source: Home / Self Care | Admitting: Internal Medicine

## 2020-08-06 ENCOUNTER — Encounter (HOSPITAL_COMMUNITY): Admission: RE | Payer: Self-pay | Source: Home / Self Care

## 2020-08-06 ENCOUNTER — Encounter (HOSPITAL_COMMUNITY): Payer: Self-pay | Admitting: Internal Medicine

## 2020-08-06 DIAGNOSIS — Z7982 Long term (current) use of aspirin: Secondary | ICD-10-CM | POA: Diagnosis not present

## 2020-08-06 DIAGNOSIS — N1831 Chronic kidney disease, stage 3a: Secondary | ICD-10-CM | POA: Diagnosis not present

## 2020-08-06 DIAGNOSIS — J449 Chronic obstructive pulmonary disease, unspecified: Secondary | ICD-10-CM | POA: Diagnosis not present

## 2020-08-06 DIAGNOSIS — Z79899 Other long term (current) drug therapy: Secondary | ICD-10-CM | POA: Diagnosis not present

## 2020-08-06 DIAGNOSIS — I5022 Chronic systolic (congestive) heart failure: Secondary | ICD-10-CM | POA: Diagnosis not present

## 2020-08-06 DIAGNOSIS — Z87891 Personal history of nicotine dependence: Secondary | ICD-10-CM | POA: Insufficient documentation

## 2020-08-06 DIAGNOSIS — Z7952 Long term (current) use of systemic steroids: Secondary | ICD-10-CM | POA: Insufficient documentation

## 2020-08-06 DIAGNOSIS — E782 Mixed hyperlipidemia: Secondary | ICD-10-CM | POA: Insufficient documentation

## 2020-08-06 DIAGNOSIS — E669 Obesity, unspecified: Secondary | ICD-10-CM | POA: Insufficient documentation

## 2020-08-06 DIAGNOSIS — G4733 Obstructive sleep apnea (adult) (pediatric): Secondary | ICD-10-CM | POA: Insufficient documentation

## 2020-08-06 DIAGNOSIS — Z6829 Body mass index (BMI) 29.0-29.9, adult: Secondary | ICD-10-CM | POA: Insufficient documentation

## 2020-08-06 DIAGNOSIS — I5042 Chronic combined systolic (congestive) and diastolic (congestive) heart failure: Secondary | ICD-10-CM | POA: Insufficient documentation

## 2020-08-06 DIAGNOSIS — Z794 Long term (current) use of insulin: Secondary | ICD-10-CM | POA: Insufficient documentation

## 2020-08-06 DIAGNOSIS — I130 Hypertensive heart and chronic kidney disease with heart failure and stage 1 through stage 4 chronic kidney disease, or unspecified chronic kidney disease: Secondary | ICD-10-CM | POA: Insufficient documentation

## 2020-08-06 DIAGNOSIS — Z95810 Presence of automatic (implantable) cardiac defibrillator: Secondary | ICD-10-CM | POA: Diagnosis not present

## 2020-08-06 DIAGNOSIS — I2510 Atherosclerotic heart disease of native coronary artery without angina pectoris: Secondary | ICD-10-CM | POA: Diagnosis not present

## 2020-08-06 DIAGNOSIS — M109 Gout, unspecified: Secondary | ICD-10-CM | POA: Insufficient documentation

## 2020-08-06 DIAGNOSIS — I340 Nonrheumatic mitral (valve) insufficiency: Secondary | ICD-10-CM | POA: Diagnosis not present

## 2020-08-06 DIAGNOSIS — I252 Old myocardial infarction: Secondary | ICD-10-CM | POA: Diagnosis not present

## 2020-08-06 DIAGNOSIS — Z955 Presence of coronary angioplasty implant and graft: Secondary | ICD-10-CM | POA: Diagnosis not present

## 2020-08-06 DIAGNOSIS — E1122 Type 2 diabetes mellitus with diabetic chronic kidney disease: Secondary | ICD-10-CM | POA: Insufficient documentation

## 2020-08-06 DIAGNOSIS — F1010 Alcohol abuse, uncomplicated: Secondary | ICD-10-CM | POA: Diagnosis not present

## 2020-08-06 HISTORY — PX: RIGHT/LEFT HEART CATH AND CORONARY ANGIOGRAPHY: CATH118266

## 2020-08-06 LAB — POCT I-STAT 7, (LYTES, BLD GAS, ICA,H+H)
Acid-Base Excess: 4 mmol/L — ABNORMAL HIGH (ref 0.0–2.0)
Bicarbonate: 28.7 mmol/L — ABNORMAL HIGH (ref 20.0–28.0)
Calcium, Ion: 1.06 mmol/L — ABNORMAL LOW (ref 1.15–1.40)
HCT: 34 % — ABNORMAL LOW (ref 39.0–52.0)
Hemoglobin: 11.6 g/dL — ABNORMAL LOW (ref 13.0–17.0)
O2 Saturation: 95 %
Potassium: 3.8 mmol/L (ref 3.5–5.1)
Sodium: 141 mmol/L (ref 135–145)
TCO2: 30 mmol/L (ref 22–32)
pCO2 arterial: 43.4 mmHg (ref 32.0–48.0)
pH, Arterial: 7.428 (ref 7.350–7.450)
pO2, Arterial: 72 mmHg — ABNORMAL LOW (ref 83.0–108.0)

## 2020-08-06 LAB — POCT I-STAT EG7
Acid-Base Excess: 4 mmol/L — ABNORMAL HIGH (ref 0.0–2.0)
Acid-Base Excess: 5 mmol/L — ABNORMAL HIGH (ref 0.0–2.0)
Bicarbonate: 29.6 mmol/L — ABNORMAL HIGH (ref 20.0–28.0)
Bicarbonate: 30.5 mmol/L — ABNORMAL HIGH (ref 20.0–28.0)
Calcium, Ion: 1.1 mmol/L — ABNORMAL LOW (ref 1.15–1.40)
Calcium, Ion: 1.16 mmol/L (ref 1.15–1.40)
HCT: 35 % — ABNORMAL LOW (ref 39.0–52.0)
HCT: 35 % — ABNORMAL LOW (ref 39.0–52.0)
Hemoglobin: 11.9 g/dL — ABNORMAL LOW (ref 13.0–17.0)
Hemoglobin: 11.9 g/dL — ABNORMAL LOW (ref 13.0–17.0)
O2 Saturation: 68 %
O2 Saturation: 68 %
Potassium: 3.8 mmol/L (ref 3.5–5.1)
Potassium: 4 mmol/L (ref 3.5–5.1)
Sodium: 140 mmol/L (ref 135–145)
Sodium: 139 mmol/L (ref 135–145)
TCO2: 31 mmol/L (ref 22–32)
TCO2: 32 mmol/L (ref 22–32)
pCO2, Ven: 47.5 mmHg (ref 44.0–60.0)
pCO2, Ven: 47.3 mmHg (ref 44.0–60.0)
pH, Ven: 7.402 (ref 7.250–7.430)
pH, Ven: 7.418 (ref 7.250–7.430)
pO2, Ven: 36 mmHg (ref 32.0–45.0)
pO2, Ven: 35 mmHg (ref 32.0–45.0)

## 2020-08-06 LAB — GLUCOSE, CAPILLARY: Glucose-Capillary: 323 mg/dL — ABNORMAL HIGH (ref 70–99)

## 2020-08-06 SURGERY — RIGHT/LEFT HEART CATH AND CORONARY ANGIOGRAPHY
Anesthesia: LOCAL

## 2020-08-06 MED ORDER — HEPARIN (PORCINE) IN NACL 1000-0.9 UT/500ML-% IV SOLN
INTRAVENOUS | Status: DC | PRN
  Administered 2020-08-06 (×2): 500 mL

## 2020-08-06 MED ORDER — VERAPAMIL HCL 2.5 MG/ML IV SOLN
INTRAVENOUS | Status: DC | PRN
  Administered 2020-08-06: 10 mL via INTRA_ARTERIAL

## 2020-08-06 MED ORDER — SODIUM CHLORIDE 0.9 % IV SOLN: INTRAVENOUS | Status: DC

## 2020-08-06 MED ORDER — SODIUM CHLORIDE 0.9 % IV SOLN: 250.0000 mL | INTRAVENOUS | Status: DC | PRN

## 2020-08-06 MED ORDER — SODIUM CHLORIDE 0.9% FLUSH: 3.0000 mL | INTRAVENOUS | Status: DC | PRN

## 2020-08-06 MED ORDER — HEPARIN SODIUM (PORCINE) 1000 UNIT/ML IJ SOLN
INTRAMUSCULAR | Status: DC | PRN
  Administered 2020-08-06: 4500 [IU] via INTRAVENOUS

## 2020-08-06 MED ORDER — ACETAMINOPHEN 325 MG PO TABS: 650.0000 mg | ORAL_TABLET | ORAL | Status: DC | PRN

## 2020-08-06 MED ORDER — ASPIRIN 81 MG PO CHEW
81.0000 mg | CHEWABLE_TABLET | ORAL | Status: AC
  Administered 2020-08-06: 81 mg via ORAL

## 2020-08-06 MED ORDER — LABETALOL HCL 5 MG/ML IV SOLN: 10.0000 mg | INTRAVENOUS | Status: DC | PRN

## 2020-08-06 MED ORDER — MIDAZOLAM HCL 2 MG/2ML IJ SOLN
INTRAMUSCULAR | Status: DC | PRN
  Administered 2020-08-06: 1 mg via INTRAVENOUS

## 2020-08-06 MED ORDER — IOHEXOL 350 MG/ML SOLN
INTRAVENOUS | Status: DC | PRN
  Administered 2020-08-06: 100 mL via INTRA_ARTERIAL

## 2020-08-06 MED ORDER — ONDANSETRON HCL 4 MG/2ML IJ SOLN: 4.0000 mg | Freq: Four times a day (QID) | INTRAMUSCULAR | Status: DC | PRN

## 2020-08-06 MED ORDER — SODIUM CHLORIDE 0.9% FLUSH: 3.0000 mL | Freq: Two times a day (BID) | INTRAVENOUS | Status: DC

## 2020-08-06 MED ORDER — FENTANYL CITRATE (PF) 100 MCG/2ML IJ SOLN
INTRAMUSCULAR | Status: DC | PRN
  Administered 2020-08-06: 25 ug via INTRAVENOUS

## 2020-08-06 MED ORDER — LIDOCAINE HCL (PF) 1 % IJ SOLN
INTRAMUSCULAR | Status: DC | PRN
  Administered 2020-08-06: 4 mL

## 2020-08-06 MED ORDER — HYDRALAZINE HCL 20 MG/ML IJ SOLN: 10.0000 mg | INTRAMUSCULAR | Status: DC | PRN

## 2020-08-06 SURGICAL SUPPLY — 15 items
CATH 5FR JL3.5 JR4 ANG PIG MP (CATHETERS) ×2 IMPLANT
CATH BALLN WEDGE 5F 110CM (CATHETERS) ×2 IMPLANT
CATH INFINITI 5 FR LCB (CATHETERS) ×2 IMPLANT
CATH INFINITI 5 FR RCB (CATHETERS) ×2 IMPLANT
CATH INFINITI 5FR AL1 (CATHETERS) ×2 IMPLANT
CATH INFINITI 5FR JL4 (CATHETERS) ×2 IMPLANT
CATH INFINITI JR4 5F (CATHETERS) ×2 IMPLANT
DEVICE RAD COMP TR BAND LRG (VASCULAR PRODUCTS) ×2 IMPLANT
GLIDESHEATH SLEND SS 6F .021 (SHEATH) ×2 IMPLANT
GUIDEWIRE .025 260CM (WIRE) ×2 IMPLANT
GUIDEWIRE INQWIRE 1.5J.035X260 (WIRE) ×1 IMPLANT
INQWIRE 1.5J .035X260CM (WIRE) ×2
PACK CARDIAC CATHETERIZATION (CUSTOM PROCEDURE TRAY) ×2 IMPLANT
SHEATH GLIDE SLENDER 4/5FR (SHEATH) ×2 IMPLANT
TRANSDUCER W/STOPCOCK (MISCELLANEOUS) ×2 IMPLANT

## 2020-08-06 NOTE — Discharge Instructions (Signed)
DRINK PLENTY OF FLUIDS FOR THE NEXT 2-3 DAYS.  KEEP ARM ELEVATED THE REMAINDER OF THE DAY.  Radial Site Care  This sheet gives you information about how to care for yourself after your procedure. Your health care provider may also give you more specific instructions. If you have problems or questions, contact your health care provider. What can I expect after the procedure? After the procedure, it is common to have:  Bruising and tenderness at the catheter insertion area. Follow these instructions at home: Medicines  Take over-the-counter and prescription medicines only as told by your health care provider. Insertion site care 1. Follow instructions from your health care provider about how to take care of your insertion site. Make sure you: ? Wash your hands with soap and water before you change your bandage (dressing). If soap and water are not available, use hand sanitizer. ? Change your dressing as told by your health care provider. 2. Check your insertion site every day for signs of infection. Check for: ? Redness, swelling, or pain. ? Fluid or blood. ? Pus or a bad smell. ? Warmth. 3. Do not take baths, swim, or use a hot tub for 5 days. 4. You may shower 24-48 hours after the procedure. ? Remove the dressing and gently wash the site with plain soap and water. ? Pat the area dry with a clean towel. ? Do not rub the site. That could cause bleeding. 5. Do not apply powder or lotion to the site. Activity  1. For 24 hours after the procedure, or as directed by your health care provider: ? Do not flex or bend the affected arm. ? Do not push or pull heavy objects with the affected arm. ? Do not drive yourself home from the hospital or clinic. You may drive 24 hours after the procedure. ? Do not operate machinery or power tools. 2. Do not push, pull or lift anything that is heavier than 10 lb for 5 days. 3. Ask your health care provider when it is okay to: ? Return to work or  school. ? Resume usual physical activities or sports. ? Resume sexual activity. General instructions  If the catheter site starts to bleed, raise your arm and put firm pressure on the site. If the bleeding does not stop, get help right away. This is a medical emergency.  If you went home on the same day as your procedure, a responsible adult should be with you for the first 24 hours after you arrive home.  Keep all follow-up visits as told by your health care provider. This is important. Contact a health care provider if:  You have a fever.  You have redness, swelling, or yellow drainage around your insertion site. Get help right away if:  You have unusual pain at the radial site.  The catheter insertion area swells very fast.  The insertion area is bleeding, and the bleeding does not stop when you hold steady pressure on the area.  Your arm or hand becomes pale, cool, tingly, or numb. These symptoms may represent a serious problem that is an emergency. Do not wait to see if the symptoms will go away. Get medical help right away. Call your local emergency services (911 in the [place] Do not drive yourself to the hospital. Summary  After the procedure, it is common to have bruising and tenderness at the site.  Follow instructions from your health care provider about how to take care of your radial site wound. Check   the wound every day for signs of infection.  Do not push, pull or lift anything that is heavier than 10 lb for 5 days.  This information is not intended to replace advice given to you by your health care provider. Make sure you discuss any questions you have with your health care provider. Document Revised: 01/05/2018 Document Reviewed: 01/05/2018 Elsevier Patient Education  2020 Elsevier Inc. 

## 2020-08-06 NOTE — Interval H&P Note (Signed)
History and Physical Interval Note:  08/06/2020 7:56 AM  Antonio Cobb  has presented today for surgery, with the diagnosis of heart failure.  The various methods of treatment have been discussed with the patient and family. After consideration of risks, benefits and other options for treatment, the patient has consented to  Procedure(s): RIGHT/LEFT HEART CATH AND CORONARY ANGIOGRAPHY (N/A) and possible coronary angioplasty as a surgical intervention.  The patient's history has been reviewed, patient examined, no change in status, stable for surgery.  I have reviewed the patient's chart and labs.  Questions were answered to the patient's satisfaction.     Damichael Hofman

## 2020-08-06 NOTE — Progress Notes (Signed)
TR BAND REMOVAL  LOCATION:    right radial  DEFLATED PER PROTOCOL:    Yes.    TIME BAND OFF / DRESSING APPLIED:    1100   SITE UPON ARRIVAL:    Level 0  SITE AFTER BAND REMOVAL:    Level 0  CIRCULATION SENSATION AND MOVEMENT:    Within Normal Limits   Yes.

## 2020-08-07 ENCOUNTER — Ambulatory Visit: Payer: BC Managed Care – PPO | Admitting: "Endocrinology

## 2020-08-08 DIAGNOSIS — M19032 Primary osteoarthritis, left wrist: Secondary | ICD-10-CM | POA: Diagnosis not present

## 2020-08-08 DIAGNOSIS — M1812 Unilateral primary osteoarthritis of first carpometacarpal joint, left hand: Secondary | ICD-10-CM | POA: Diagnosis not present

## 2020-08-10 DIAGNOSIS — G4733 Obstructive sleep apnea (adult) (pediatric): Secondary | ICD-10-CM | POA: Diagnosis not present

## 2020-08-12 DIAGNOSIS — M19032 Primary osteoarthritis, left wrist: Secondary | ICD-10-CM | POA: Diagnosis not present

## 2020-08-12 DIAGNOSIS — M1812 Unilateral primary osteoarthritis of first carpometacarpal joint, left hand: Secondary | ICD-10-CM | POA: Diagnosis not present

## 2020-08-14 ENCOUNTER — Ambulatory Visit (INDEPENDENT_AMBULATORY_CARE_PROVIDER_SITE_OTHER): Payer: BC Managed Care – PPO | Admitting: *Deleted

## 2020-08-14 DIAGNOSIS — Z95810 Presence of automatic (implantable) cardiac defibrillator: Secondary | ICD-10-CM

## 2020-08-14 DIAGNOSIS — I255 Ischemic cardiomyopathy: Secondary | ICD-10-CM

## 2020-08-14 LAB — CUP PACEART REMOTE DEVICE CHECK
Battery Remaining Longevity: 174 mo
Battery Remaining Percentage: 100 %
Brady Statistic RV Percent Paced: 0 %
Date Time Interrogation Session: 20210901003600
HighPow Impedance: 85 Ohm
Implantable Lead Implant Date: 20190219
Implantable Lead Location: 753860
Implantable Lead Model: 293
Implantable Lead Serial Number: 438538
Implantable Pulse Generator Implant Date: 20190219
Lead Channel Impedance Value: 885 Ohm
Lead Channel Setting Pacing Amplitude: 2.5 V
Lead Channel Setting Pacing Pulse Width: 0.4 ms
Lead Channel Setting Sensing Sensitivity: 0.5 mV
Pulse Gen Serial Number: 248173

## 2020-08-15 NOTE — Progress Notes (Signed)
Remote ICD transmission.   

## 2020-08-16 ENCOUNTER — Ambulatory Visit (INDEPENDENT_AMBULATORY_CARE_PROVIDER_SITE_OTHER): Payer: BC Managed Care – PPO

## 2020-08-16 ENCOUNTER — Telehealth: Payer: Self-pay

## 2020-08-16 DIAGNOSIS — I5022 Chronic systolic (congestive) heart failure: Secondary | ICD-10-CM | POA: Diagnosis not present

## 2020-08-16 DIAGNOSIS — Z95810 Presence of automatic (implantable) cardiac defibrillator: Secondary | ICD-10-CM | POA: Diagnosis not present

## 2020-08-16 NOTE — Progress Notes (Signed)
EPIC Encounter for ICM Monitoring  Patient Name: Antonio Cobb is a 59 y.o. male Date: 08/16/2020 Primary Care Physican: [name], MD Primary Cardiologist: Bensimhon Electrophysiologist: Lovena Le 08/16/2020 Weight: 205 lbs        1st ICM Remote Transmission.  Attempted call to patient and unable to reach.  Left detailed message per DPR regarding transmission. Transmission reviewed.          Heart[employer]ic Heart Failure Index is 1 suggesting fluid accumulation is within normal threshold range..   Prescribed:   Torsemide 20 Take 2 tablets (40 mg total) by mouth daily  Potassium 20 mEq take 1 tablet by mouth daily.  Spironolactone 25 mg take 1 tablet by mouth daily  Labs: 07/25/2020 Creatinine 1.47, BUN 35, Potassium 4.1, Sodium 138, GFR 51-59 07/16/2020 Creatinine 1.80, BUN 53, Potassium 4.5, Sodium 138, GFR 40-46  07/12/2020 Creatinine 1.56, BUN 35, Potassium 4.0, Sodium 136, GFR 48-55  07/11/2020 Creatinine 1.54, BUN 35, Potassium 3.7, Sodium 136, GFR 48-55  A complete set of results can be found in Results Review.  Recommendations: Left voice mail with ICM number and encouraged to call if experiencing any fluid symptoms.  Follow-up plan: ICM clinic phone appointment on 09/16/2020.   91 day device clinic remote transmission 11/13/2020.    EP/Cardiology Office Visits: 08/21/2020 with Dr. Haroldine Laws.    Copy of ICM check sent to Dr. Lovena Le.   3 Month Trend   8 Day Data Trend   Rosalene Billings, RN 08/16/2020 1:47 PM

## 2020-08-16 NOTE — Telephone Encounter (Signed)
Remote ICM transmission received.  Attempted call to patient regarding ICM remote transmission and left detailed message per DPR.  Advised to return call for any fluid symptoms or questions. Next ICM remote transmission scheduled 09/16/2020.

## 2020-08-21 ENCOUNTER — Ambulatory Visit: Payer: BC Managed Care – PPO | Admitting: Family Medicine

## 2020-08-21 ENCOUNTER — Ambulatory Visit (HOSPITAL_COMMUNITY)
Admission: RE | Admit: 2020-08-21 | Discharge: 2020-08-21 | Disposition: A | Discharge status: Home / Self Care | Payer: BC Managed Care – PPO | Source: Ambulatory Visit | Attending: Internal Medicine | Admitting: Internal Medicine

## 2020-08-21 ENCOUNTER — Other Ambulatory Visit: Payer: Self-pay

## 2020-08-21 VITALS — BP 105/70 | HR 107 | Wt 212.4 lb

## 2020-08-21 DIAGNOSIS — I5042 Chronic combined systolic (congestive) and diastolic (congestive) heart failure: Secondary | ICD-10-CM | POA: Diagnosis not present

## 2020-08-21 DIAGNOSIS — Z7982 Long term (current) use of aspirin: Secondary | ICD-10-CM | POA: Diagnosis not present

## 2020-08-21 DIAGNOSIS — I252 Old myocardial infarction: Secondary | ICD-10-CM | POA: Diagnosis not present

## 2020-08-21 DIAGNOSIS — I340 Nonrheumatic mitral (valve) insufficiency: Secondary | ICD-10-CM | POA: Diagnosis not present

## 2020-08-21 DIAGNOSIS — I5022 Chronic systolic (congestive) heart failure: Secondary | ICD-10-CM | POA: Diagnosis not present

## 2020-08-21 DIAGNOSIS — I2510 Atherosclerotic heart disease of native coronary artery without angina pectoris: Secondary | ICD-10-CM | POA: Diagnosis not present

## 2020-08-21 DIAGNOSIS — Z955 Presence of coronary angioplasty implant and graft: Secondary | ICD-10-CM | POA: Diagnosis not present

## 2020-08-21 DIAGNOSIS — Z79899 Other long term (current) drug therapy: Secondary | ICD-10-CM | POA: Diagnosis not present

## 2020-08-21 DIAGNOSIS — E1122 Type 2 diabetes mellitus with diabetic chronic kidney disease: Secondary | ICD-10-CM | POA: Diagnosis not present

## 2020-08-21 DIAGNOSIS — F1010 Alcohol abuse, uncomplicated: Secondary | ICD-10-CM

## 2020-08-21 DIAGNOSIS — Z8249 Family history of ischemic heart disease and other diseases of the circulatory system: Secondary | ICD-10-CM | POA: Insufficient documentation

## 2020-08-21 DIAGNOSIS — N1831 Chronic kidney disease, stage 3a: Secondary | ICD-10-CM | POA: Diagnosis not present

## 2020-08-21 DIAGNOSIS — J449 Chronic obstructive pulmonary disease, unspecified: Secondary | ICD-10-CM | POA: Diagnosis not present

## 2020-08-21 DIAGNOSIS — Z7902 Long term (current) use of antithrombotics/antiplatelets: Secondary | ICD-10-CM | POA: Diagnosis not present

## 2020-08-21 DIAGNOSIS — Z7901 Long term (current) use of anticoagulants: Secondary | ICD-10-CM | POA: Diagnosis not present

## 2020-08-21 DIAGNOSIS — Z794 Long term (current) use of insulin: Secondary | ICD-10-CM | POA: Diagnosis not present

## 2020-08-21 DIAGNOSIS — Z87891 Personal history of nicotine dependence: Secondary | ICD-10-CM | POA: Insufficient documentation

## 2020-08-21 DIAGNOSIS — Z95810 Presence of automatic (implantable) cardiac defibrillator: Secondary | ICD-10-CM | POA: Diagnosis not present

## 2020-08-21 DIAGNOSIS — I130 Hypertensive heart and chronic kidney disease with heart failure and stage 1 through stage 4 chronic kidney disease, or unspecified chronic kidney disease: Secondary | ICD-10-CM | POA: Diagnosis not present

## 2020-08-21 DIAGNOSIS — I255 Ischemic cardiomyopathy: Secondary | ICD-10-CM | POA: Diagnosis not present

## 2020-08-21 DIAGNOSIS — G4733 Obstructive sleep apnea (adult) (pediatric): Secondary | ICD-10-CM | POA: Diagnosis not present

## 2020-08-21 DIAGNOSIS — R000 Tachycardia, unspecified: Secondary | ICD-10-CM | POA: Diagnosis not present

## 2020-08-21 DIAGNOSIS — E669 Obesity, unspecified: Secondary | ICD-10-CM | POA: Insufficient documentation

## 2020-08-21 DIAGNOSIS — J811 Chronic pulmonary edema: Secondary | ICD-10-CM | POA: Insufficient documentation

## 2020-08-21 DIAGNOSIS — E782 Mixed hyperlipidemia: Secondary | ICD-10-CM | POA: Diagnosis not present

## 2020-08-21 DIAGNOSIS — N183 Chronic kidney disease, stage 3 unspecified: Secondary | ICD-10-CM

## 2020-08-21 LAB — PROTIME-INR
INR: 0.9 (ref 0.8–1.2)
Prothrombin Time: 11.8 seconds (ref 11.4–15.2)

## 2020-08-21 LAB — CBC
HCT: 38.3 % — ABNORMAL LOW (ref 39.0–52.0)
Hemoglobin: 12 g/dL — ABNORMAL LOW (ref 13.0–17.0)
MCH: 27.7 pg (ref 26.0–34.0)
MCHC: 31.3 g/dL (ref 30.0–36.0)
MCV: 88.5 fL (ref 80.0–100.0)
Platelets: 253 10*3/uL (ref 150–400)
RBC: 4.33 MIL/uL (ref 4.22–5.81)
RDW: 14.6 % (ref 11.5–15.5)
WBC: 15.6 10*3/uL — ABNORMAL HIGH (ref 4.0–10.5)
nRBC: 0 % (ref 0.0–0.2)

## 2020-08-21 LAB — BASIC METABOLIC PANEL
Anion gap: 13 (ref 5–15)
BUN: 57 mg/dL — ABNORMAL HIGH (ref 6–20)
CO2: 21 mmol/L — ABNORMAL LOW (ref 22–32)
Calcium: 9.2 mg/dL (ref 8.9–10.3)
Chloride: 100 mmol/L (ref 98–111)
Creatinine, Ser: 2.21 mg/dL — ABNORMAL HIGH (ref 0.61–1.24)
GFR calc Af Amer: 36 mL/min — ABNORMAL LOW (ref >60)
GFR calc non Af Amer: 31 mL/min — ABNORMAL LOW (ref >60)
Glucose, Bld: 77 mg/dL (ref 70–99)
Potassium: 4.1 mmol/L (ref 3.5–5.1)
Sodium: 134 mmol/L — ABNORMAL LOW (ref 135–145)

## 2020-08-21 LAB — BRAIN NATRIURETIC PEPTIDE: B Natriuretic Peptide: 116.9 pg/mL — ABNORMAL HIGH (ref 0.0–100.0)

## 2020-08-21 MED ORDER — METOPROLOL SUCCINATE ER 100 MG PO TB24: 100.0000 mg | ORAL_TABLET | Freq: Every day | ORAL | 3 refills | Status: AC

## 2020-08-21 MED ORDER — CLOPIDOGREL BISULFATE 75 MG PO TABS: 75.0000 mg | ORAL_TABLET | Freq: Every day | ORAL | 4 refills | Status: DC

## 2020-08-21 NOTE — Progress Notes (Signed)
[facility]  NOTE  Referring Physician: Branch Primary Care: Tula Nakayama Primary Cardiologist: Harl Bowie  HPI:  Antonio Cobb is a 59 y.o. male with CAD(s/p STEMI in 2013 with DES to LCx, patent by repeat cath in 05/2016 with moderate residual CAD along RCA and LAD), chronic combined systolic and diastolic CHF (EF 17-51% by echo in 09/2016, 25-30% in 08/2017 and 20% by most recent echo in 04/2020, s/p ICD placement in 01/2018), HTN,HLD, IDDM and OSA (on CPAP) who presents to the office today for hospital follow-up.   He was admitted to [facility] in 04/2020 for a CHF exacerbation and responded quickly with IV Lasix. Repeat echo did show his EF remained reduced at 20% and he had Grade 2 DD. RV function was normal. His outpatient Torsemide was increased to 60mg  daily and follow-up was arranged for 2 weeks but he canceled the visit.   He was again readmitted from 6/27 - 06/10/2020 for flash pulmonary edema as BNP was elevated to 519 and CXR was consistent with vascular congestion. He again responded quickly with IV Lasix and was discharged the following morning. Torsemide 60mg  daily was resumed and weight at the time of discharge was 209 lbs.   Readmitted 7/29-7/30/21 with recurrent volume overload and SOB. Brought to ED and required BIPAP. Hstrop 21 and 23. Improved quickly with IV lasix   Underwent cath 08/06/20 with 2v CAD and preserved hemodynamics  He is here with his wife. Feels ok. No CP. Mild DOE. Rides his bike 2.5 miles per day. No edema, orthopnea or PND.   ICD interrogated in clinic. HL score = 1 No VT/AF Personally reviewed  Cath 08/06/20 RA = 5 RV = 28/2 PA = 31/11 (18) PCW =13 Fick cardiac output/index = 5.8/2.8 PVR = 0.9 WU Ao sat = 95% PA sat = 68%, 68%  Assessment: 1. 3v CAD with patent LCx stent 2. There are high-grade lesions in the ostial LCX and mid D1 3. 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 Well-compensated hemodynamics   Past Medical  History:  Diagnosis Date  . Acute CHF (congestive heart failure) ([place]) 01/21/2020  . Acute on chronic combined systolic and diastolic CHF (congestive heart failure) ([place])   . Acute respiratory failure with hypoxia and hypercapnia ([facility])   . Acute systolic CHF (congestive heart failure) ([place]) 05/08/2020  . Alcohol abuse   . CAD (coronary artery disease)    a. s/p inferolateral STEMI in 05/2012 with DES to LCx b. patent stent by cath in 05/2016 with moderate RCA and LAD disease  . Cardiomyopathy ([place])    a. LVEF 30-35% by echo in 09/2016. b. 08/2017: echo showing EF remains reduced at 25-30% c. s/p [employer] ICD placement in 01/2018  . CHF (congestive heart failure) ([place])   . CHF exacerbation ([place]) 11/18/2019  . COPD (chronic obstructive pulmonary disease) ([place])   . Diabetes mellitus, type 2 ([place])   . Essential hypertension   . Hypertensive urgency   . Mitral regurgitation    Moderate  . Mixed hyperlipidemia   . Myocardial infarction ([place]) 05/23/2012   Inferolateral STEMI  . Obesity     Current Outpatient Medications  Medication Sig Dispense Refill  . aspirin 81 MG EC tablet Take 1 tablet (81 mg total) by mouth daily with breakfast. 90 tablet 3  . dapagliflozin propanediol (FARXIGA) 10 MG TABS tablet Take 1 tablet (10 mg total) by mouth daily before breakfast. 30 tablet 3  . docusate sodium (COLACE) 100 MG capsule  Take 100 mg by mouth daily.    [name] esomeprazole (NEXIUM) 20 MG capsule Take 20 mg by mouth daily at 12 noon.    . famotidine (PEPCID) 20 MG tablet Take 1 tablet (20 mg total) by mouth at bedtime.    [name] glipiZIDE (GLUCOTROL XL) 5 MG 24 hr tablet Take 1 tablet (5 mg total) by mouth daily with breakfast. 30 tablet 3  . hydrOXYzine (ATARAX/VISTARIL) 25 MG tablet Take 25 mg by mouth at bedtime as needed.    . insulin glargine (LANTUS SOLOSTAR) 100 UNIT/ML Solostar Pen Inject 60 Units into the skin at bedtime. 10 pen 2  . losartan (COZAAR) 25 MG tablet Take 1 tablet (25 mg total)  by mouth daily. 30 tablet 5  . Melatonin 10 MG TABS Take 2 tablets by mouth at bedtime as needed (for sleep).     . metoprolol succinate (TOPROL-XL) 50 MG 24 hr tablet Take 1 tablet (50 mg total) by mouth daily. TAKE 1 TABLET BY MOUTH EVERY DAY WITH FOOD 90 tablet 3  . Multiple Vitamins-Minerals (MULTIVITAMIN WITH MINERALS) tablet Take 1 tablet by mouth daily. 120 tablet 2  . potassium chloride SA (KLOR-CON) 20 MEQ tablet Take 1 tablet (20 mEq total) by mouth daily. 90 tablet 3  . rosuvastatin (CRESTOR) 40 MG tablet Take 1 tablet (40 mg total) by mouth daily. 30 tablet 3  . sertraline (ZOLOFT) 25 MG tablet Take 1 tablet (25 mg total) by mouth daily. For anxiety/panic attacks 30 tablet 5  . spironolactone (ALDACTONE) 25 MG tablet Take 1 tablet (25 mg total) by mouth daily. 90 tablet 3  . torsemide (DEMADEX) 20 MG tablet Take 40 mg by mouth daily.     No current facility-administered medications for this encounter.    Allergies  Allergen Reactions  . Entresto [Sacubitril-Valsartan]     Dizziness, Coughing  . Other     Seasonal allergies  - has to use inhaler      Social History   Socioeconomic History  . Marital status: Married    Spouse name: Not on file  . Number of children: Not on file  . Years of education: Not on file  . Highest education level: Not on file  Occupational History  . Occupation: Therapist, sports: [employer]  Tobacco Use  . Smoking status: Former Smoker    Packs/day: 0.25    Years: 30.00    Pack years: 7.50    Types: Cigars    Quit date: 05/23/2016    Years since quitting: 4.2  . Smokeless tobacco: Never Used  . Tobacco comment: since early 54s  Vaping Use  . Vaping Use: Never used  Substance and Sexual Activity  . Alcohol use: Yes    Alcohol/week: 0.0 standard drinks    Comment: occ  . Drug use: No  . Sexual activity: Not Currently  Other Topics Concern  . Not on file  Social History Narrative  . Not on file   Social Determinants  of Health   Financial Resource Strain:   . Difficulty of Paying Living Expenses: Not on file  Food Insecurity:   . Worried About Charity fundraiser in the Last Year: Not on file  . Ran Out of Food in the Last Year: Not on file  Transportation Needs:   . Lack of Transportation (Medical): Not on file  . Lack of Transportation (Non-Medical): Not on file  Physical Activity:   . Days of Exercise per Week: Not  on file  . Minutes of Exercise per Session: Not on file  Stress:   . Feeling of Stress : Not on file  Social Connections:   . Frequency of Communication with Friends and Family: Not on file  . Frequency of Social Gatherings with Friends and Family: Not on file  . Attends Religious Services: Not on file  . Active Member of Clubs or Organizations: Not on file  . Attends Archivist Meetings: Not on file  . Marital Status: Not on file  Intimate Partner Violence:   . Fear of Current or Ex-Partner: Not on file  . Emotionally Abused: Not on file  . Physically Abused: Not on file  . Sexually Abused: Not on file      Family History  Problem Relation Age of Onset  . Emphysema Mother   . Heart attack Mother   . Hypertension Mother   . Diabetes Mother   . Heart disease Mother   . COPD Mother   . Emphysema Father   . COPD Father   . Stroke Sister   . Asthma Sister   . Colon cancer Neg Hx   . Anesthesia problems Neg Hx   . Hypotension Neg Hx   . Malignant hyperthermia Neg Hx   . Pseudochol deficiency Neg Hx     Vitals:   08/21/20 1549  BP: 105/70  Pulse: (!) 107  SpO2: 97%  Weight: 96.3 kg (212 lb 6.4 oz)    PHYSICAL EXAM: General:  Well appearing. No resp difficulty HEENT: normal Neck: supple. no JVD. Carotids 2+ bilat; no bruits. No lymphadenopathy or thryomegaly appreciated. Cor: PMI nondisplaced. Regular tachy No rubs, gallops or murmurs. Lungs: clear Abdomen: obese soft, nontender, nondistended. No hepatosplenomegaly. No bruits or masses. Good bowel  sounds. Extremities: no cyanosis, clubbing, rash, edema Neuro: alert & orientedx3, cranial nerves grossly intact. moves all 4 extremities w/o difficulty. Affect pleasant  ECG: Sinus tach 105 LVH Personally reviewed   ASSESSMENT & PLAN: 1. Chronic Combined Systolic and Diastolic CHF - suspect iCM s/p LCX STEMI in 2013  - EF 30-35% by echo in 2017.  (cardiomyopathy felt to be out of proportion to his CAD at that time and possibly alcohol induced)  - Echo 5/21 EF 20% - s/p BSci ICD placement in 2019. - Improved NYHA II - Volume status looks ok on torsemide 40 daily (was dry on 60 daily per ICD).  - ICD interrogated personally in clinic. HL = 1. Volume ok. No VT/AF.  - Unclear why he is so tachycardic today. Suspect he may be dry. Check labs - Continue Losartan 25 daily (failed Entresto due to cough per his report) - Increase Toprol to 100 daily - Continue Arlyce Harman 25 daily  - Continue Farxiga 10 daily - If EF does not improve post PCI, consdier PYP. We also discussed that ETOH may be an issue and I urged him to start cutting back and to try to stop completely - Discussed possible cardiomems as needed. Will enroll in ICD program with Sharman Cheek   2. CAD - He is s/p STEMI in 2013 with DES to LCx, patent by repeat cath in 05/2016 with moderate residual CAD along RCA and LAD. - Cath results reviewed with Dr. Burt Knack. Plan PCI. - Have added SGLT2i - Start Plavix 75  3. Mitral Regurgitation -This was mild by most recent echocardiogram in 04/2020.  4. HTN - BP controlled  5. OSA - Continued compliance with CPAP encouraged.   6.  CKD  3a - Baseline creatinine 1.3 - 1.4. Peaked at 1.8 recently - Recheck today. Avoid overdiuresis   7. ETOH abuse - encouraged cessation as above  8. DM2, uncontrolled - Hgba1c 10.2 - On Farxiga   Total time spent 35 minutes. Over half that time spent discussing above.   Glori Bickers, MD  4:02 PM

## 2020-08-21 NOTE — Patient Instructions (Addendum)
START Plavix 75mg  Daily  INCREASE Metoprolol 100mg  Daily  Labs done today, your results will be available in MyChart, we will contact you for abnormal readings.  Your physician recommends you follow up with our clinic in 3 months  If you have any questions or concerns before your next appointment please send [place] a message through [place] or call our office at [phone].    TO LEAVE A MESSAGE FOR THE NURSE SELECT OPTION 2, PLEASE LEAVE A MESSAGE INCLUDING: . YOUR NAME . DATE OF BIRTH . CALL BACK NUMBER . REASON FOR CALL**this is important as we prioritize the call backs  [place] AS LONG AS YOU CALL BEFORE 4:00 PM  At the [facility], you and your health needs are our priority. As part of our continuing mission to provide you with exceptional heart care, we have created designated Provider Care Teams. These Care Teams include your primary Cardiologist (physician) and Advanced Practice Providers (APPs- Physician Assistants and Nurse Practitioners) who all work together to provide you with the care you need, when you need it.   You may see any of the following providers on your designated Care Team at your next follow up: [name] Dr Glori Bickers . Dr Loralie Champagne . Darrick Grinder, NP . Lyda Jester, PA . Audry Riles, PharmD   Please be sure to bring in all your medications bottles to every appointment.

## 2020-08-23 ENCOUNTER — Telehealth: Payer: Self-pay

## 2020-08-23 ENCOUNTER — Other Ambulatory Visit: Payer: Self-pay | Admitting: Family Medicine

## 2020-08-23 NOTE — Telephone Encounter (Signed)
-----   Message from Candis Schatz sent at 08/23/2020  9:42 AM EDT ----- Regarding: add to Coop's sch Valetta Fuller,   Can you please add to Coop's schedule for Tuesday or Thursday afternoon next week? He said he was adding himself back into the clinic. He needs to see him and set him up for a PCI.  Thanks [employer]

## 2020-08-23 NOTE — Telephone Encounter (Signed)
Called to arrange visit with Dr. Burt Knack to arrange PCI.  Left message for wife [phone]) to call back to schedule.

## 2020-08-23 NOTE — Telephone Encounter (Signed)
Left message that an appointment is being held with Dr. Burt Knack on 9/16 at 1400. Instructed the patient/his wife to call back to confirm or cancel.

## 2020-08-26 NOTE — Telephone Encounter (Signed)
Confirmed with patient's wife he will be here for 1620 appointment tomorrow. Gave her address and instructed her to have him here 15 minutes early for check in. She was grateful for assistance.

## 2020-08-26 NOTE — Telephone Encounter (Signed)
Left message for patient's wife he has been moved to 1620 and will need to arrive 15 minutes prior to appointment. Instructed her to call back to confirm.

## 2020-08-26 NOTE — Telephone Encounter (Signed)
Patient's wife calling to confirm appt. She wants to know if there is anything after 2:30pm that can be arranged. She states her husband doesn't get off until 28. Please advise. If she does not answer then leave a VM. If nothing can be done later then he will come at 2:00pm.

## 2020-08-27 ENCOUNTER — Encounter: Payer: Self-pay | Admitting: Cardiovascular Disease

## 2020-08-27 ENCOUNTER — Other Ambulatory Visit: Payer: Self-pay

## 2020-08-27 ENCOUNTER — Ambulatory Visit (INDEPENDENT_AMBULATORY_CARE_PROVIDER_SITE_OTHER): Payer: BC Managed Care – PPO | Admitting: Cardiovascular Disease

## 2020-08-27 VITALS — BP 110/64 | HR 88 | Ht 70.0 in | Wt 218.4 lb

## 2020-08-27 DIAGNOSIS — I5022 Chronic systolic (congestive) heart failure: Secondary | ICD-10-CM

## 2020-08-27 DIAGNOSIS — I2510 Atherosclerotic heart disease of native coronary artery without angina pectoris: Secondary | ICD-10-CM

## 2020-08-27 MED ORDER — TORSEMIDE 20 MG PO TABS: 40.0000 mg | ORAL_TABLET | ORAL | 3 refills | Status: AC

## 2020-08-27 NOTE — Progress Notes (Signed)
Cardiology Office Note:    Date:  08/27/2020   ID:  Antonio Cobb, DOB 04/14/1961, MRN [id number]  PCP:  [name], MD  [place] Cardiologist:  [name], MD  [facility] Electrophysiologist:  Cristopher Peru, MD   Referring MD: [name], MD   Chief Complaint  Patient presents with  . Coronary Artery Disease    History of Present Illness:    Antonio Cobb is a 59 y.o. male with a hx of CAD, presenting for interventional cardiology consultation at the request of Dr Haroldine Laws.   The patient's coronary history dates back to 2013 when he presented with ST elevation infarction treated with stenting of the left circumflex.  At that time he was noted to have moderate residual CAD involving the LAD and RCA.  He has had severe LV systolic dysfunction with LVEF less than 35% and has undergone ICD implantation.  Comorbid conditions include hypertension, mixed hyperlipidemia, type 2 diabetes, and obstructive sleep apnea.  The patient was admitted June 27 with acute heart failure/flash pulmonary edema, with rapid improvement with IV diuretics.  He was readmitted July 29 with heart failure again.  This admission required administration of BiPAP and he again improved with IV diuretic therapy.  Cardiac catheterization has demonstrated severe stenosis of the ostial left circumflex, severe stenosis of the diagonal branch and moderately severe stenosis of the mid RCA.  He is referred for consideration of PCI.  The patient is here with his wife today.  He is getting along pretty well at present.  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  He has mild shortness of breath with activity, but denies any chest pain or pressure.  He denies leg swelling, orthopnea, PND, or any recent heart palpitations.  He denies lightheadedness or syncope.  He has no right arm complaints after undergoing radial catheterization by Dr. Haroldine Laws recently.  He has been started on  clopidogrel and denies any bleeding or bruising problems.  Past Medical History:  Diagnosis Date  . Acute CHF (congestive heart failure) ([place]) 01/21/2020  . Acute on chronic combined systolic and diastolic CHF (congestive heart failure) ([place])   . Acute respiratory failure with hypoxia and hypercapnia (HCC)   . Acute systolic CHF (congestive heart failure) ([place]) 05/08/2020  . Alcohol abuse   . CAD (coronary artery disease)    a. s/p inferolateral STEMI in 05/2012 with DES to LCx b. patent stent by cath in 05/2016 with moderate RCA and LAD disease  . Cardiomyopathy ([place])    a. LVEF 30-35% by echo in 09/2016. b. 08/2017: echo showing EF remains reduced at 25-30% c. s/p [employer] ICD placement in 01/2018  . CHF (congestive heart failure) ([place])   . CHF exacerbation ([place]) 11/18/2019  . COPD (chronic obstructive pulmonary disease) ([place])   . Diabetes mellitus, type 2 ([place])   . Essential hypertension   . Hypertensive urgency   . Mitral regurgitation    Moderate  . Mixed hyperlipidemia   . Myocardial infarction ([place]) 05/23/2012   Inferolateral STEMI  . Obesity     Past Surgical History:  Procedure Laterality Date  . CARDIAC CATHETERIZATION  2 yrs ago  . CARDIAC CATHETERIZATION N/A 05/15/2016   Procedure: Right/Left Heart Cath and Coronary Angiography;  Surgeon: [name], MD;  Location: [place] CV LAB;  Service: Cardiovascular;  Laterality: N/A;  . CORONARY STENT PLACEMENT  05/23/12  . ICD IMPLANT N/A 02/01/2018   Procedure: ICD  IMPLANT;  Surgeon: Evans Lance, MD;  Location: [place] CV [facility];  Service: Cardiovascular;  Laterality: N/A;  . LEFT HEART CATHETERIZATION WITH CORONARY ANGIOGRAM N/A 05/23/2012   Procedure: LEFT HEART CATHETERIZATION WITH CORONARY ANGIOGRAM;  Surgeon: [name], MD;  Location: [facility] CATH LAB;  Service: Cardiovascular;  Laterality: N/A;  . None    . PERCUTANEOUS CORONARY STENT INTERVENTION (PCI-S) N/A 05/23/2012   Procedure: PERCUTANEOUS  CORONARY STENT INTERVENTION (PCI-S);  Surgeon: [name], MD;  Location: [facility] CATH LAB;  Service: Cardiovascular;  Laterality: N/A;  . POLYPECTOMY  10/16/2011   Procedure: POLYPECTOMY;  Surgeon: [name], MD;  Location: [facility];  Service: Endoscopy;;  Polypoid Lesion, Transverse and Sigmoid Colon  . RIGHT/LEFT HEART CATH AND CORONARY ANGIOGRAPHY N/A 08/06/2020   Procedure: RIGHT/LEFT HEART CATH AND CORONARY ANGIOGRAPHY;  Surgeon: Jolaine Artist, MD;  Location: [place] CV LAB;  Service: Cardiovascular;  Laterality: N/A;    Current Medications: Current Meds  Medication Sig  . aspirin 81 MG EC tablet Take 1 tablet (81 mg total) by mouth daily with breakfast.  . clopidogrel (PLAVIX) 75 MG tablet Take 1 tablet (75 mg total) by mouth daily.  . dapagliflozin propanediol (FARXIGA) 10 MG TABS tablet Take 1 tablet (10 mg total) by mouth daily before breakfast.  . docusate sodium (COLACE) 100 MG capsule Take 100 mg by mouth daily.  [name] esomeprazole (NEXIUM) 20 MG capsule Take 20 mg by mouth daily at 12 noon.  . famotidine (PEPCID) 20 MG tablet Take 1 tablet (20 mg total) by mouth at bedtime.  [name] glipiZIDE (GLUCOTROL XL) 5 MG 24 hr tablet Take 1 tablet (5 mg total) by mouth daily with breakfast.  . hydrOXYzine (ATARAX/VISTARIL) 25 MG tablet Take 25 mg by mouth at bedtime as needed.  . insulin glargine (LANTUS SOLOSTAR) 100 UNIT/ML Solostar Pen Inject 60 Units into the skin at bedtime.  . Melatonin 10 MG TABS Take 2 tablets by mouth at bedtime as needed (for sleep).   . metoprolol succinate (TOPROL-XL) 100 MG 24 hr tablet Take 1 tablet (100 mg total) by mouth daily. TAKE 1 TABLET BY MOUTH EVERY DAY WITH FOOD  . Multiple Vitamins-Minerals (MULTIVITAMIN WITH MINERALS) tablet Take 1 tablet by mouth daily.  . potassium chloride SA (KLOR-CON) 20 MEQ tablet Take 1 tablet (20 mEq total) by mouth daily.  . rosuvastatin (CRESTOR) 40 MG tablet Take 1 tablet (40 mg total) by mouth daily.  . sertraline  (ZOLOFT) 25 MG tablet Take 1 tablet (25 mg total) by mouth daily. For anxiety/panic attacks  . spironolactone (ALDACTONE) 25 MG tablet Take 1 tablet (25 mg total) by mouth daily.  Derrill Memo ON 08/29/2020] torsemide (DEMADEX) 20 MG tablet Take 2 tablets (40 mg total) by mouth 2 (two) times a week. Take on Mondays and Fridays.  . [DISCONTINUED] losartan (COZAAR) 25 MG tablet Take 1 tablet (25 mg total) by mouth daily.  . [DISCONTINUED] torsemide (DEMADEX) 20 MG tablet Take 40 mg by mouth daily.     Allergies:   Entresto [sacubitril-valsartan] and Other   Social History   Socioeconomic History  . Marital status: Married    Spouse name: Not on file  . Number of children: Not on file  . Years of education: Not on file  . Highest education level: Not on file  Occupational History  . Occupation: Therapist, sports: [employer]  Tobacco Use  . Smoking status: Former Smoker    Packs/day: 0.25  Years: 30.00    Pack years: 7.50    Types: Cigars    Quit date: 05/23/2016    Years since quitting: 4.2  . Smokeless tobacco: Never Used  . Tobacco comment: since early 78s  Vaping Use  . Vaping Use: Never used  Substance and Sexual Activity  . Alcohol use: Yes    Alcohol/week: 0.0 standard drinks    Comment: occ  . Drug use: No  . Sexual activity: Not Currently  Other Topics Concern  . Not on file  Social History Narrative  . Not on file   Social Determinants of Health   Financial Resource Strain:   . Difficulty of Paying Living Expenses: Not on file  Food Insecurity:   . Worried About Charity fundraiser in the Last Year: Not on file  . Ran Out of Food in the Last Year: Not on file  Transportation Needs:   . Lack of Transportation (Medical): Not on file  . Lack of Transportation (Non-Medical): Not on file  Physical Activity:   . Days of Exercise per Week: Not on file  . Minutes of Exercise per Session: Not on file  Stress:   . Feeling of Stress : Not on file  Social  Connections:   . Frequency of Communication with Friends and Family: Not on file  . Frequency of Social Gatherings with Friends and Family: Not on file  . Attends Religious Services: Not on file  . Active Member of Clubs or Organizations: Not on file  . Attends Archivist Meetings: Not on file  . Marital Status: Not on file     Family History: The patient's family history includes Asthma in his sister; COPD in his father and mother; Diabetes in his mother; Emphysema in his father and mother; Heart attack in his mother; Heart disease in his mother; Hypertension in his mother; Stroke in his sister. There is no history of Colon cancer, Anesthesia problems, Hypotension, Malignant hyperthermia, or Pseudochol deficiency.  ROS:   Please see the history of present illness.    All other systems reviewed and are negative.  EKGs/Labs/Other Studies Reviewed:    The following studies were reviewed today: Echo 05/09/2020: IMPRESSIONS    1. Left ventricular ejection fraction, by estimation, is 20%. The left  ventricle has severely decreased function. The left ventricle demonstrates  regional wall motion abnormalities (see scoring diagram/findings for  description). The left ventricular  internal cavity size was mildly dilated. There is mild left ventricular  hypertrophy. Left ventricular diastolic parameters are consistent with  Grade II diastolic dysfunction (pseudonormalization).  2. Right ventricular systolic function is normal. The right ventricular  size is normal. There is normal pulmonary artery systolic pressure. The  estimated right ventricular systolic pressure is 31.5 mmHg.  3. Left atrial size was upper normal.  4. The mitral valve is grossly normal. Mild mitral valve regurgitation.  5. The aortic valve is tricuspid. Aortic valve regurgitation is trivial.  6. The inferior vena cava is dilated in size with >50% respiratory  variability, suggesting right atrial pressure  of 8 mmHg.   Cardiac Cath 08/06/2020: Conclusion    Mid LAD lesion is 50% stenosed.  Ost 2nd Diag to 2nd Diag lesion is 70% stenosed.  Ost 1st Diag to 1st Diag lesion is 25% stenosed.  Ost Cx lesion is 85% stenosed.  Mid RCA lesion is 50% stenosed.  Previously placed Prox Cx to Dist Cx stent (unknown type) is widely patent.  2nd Mrg lesion is  50% stenosed.  Dist Cx lesion is 50% stenosed.  1st Diag lesion is 90% stenosed.  Dist LAD lesion is 50% stenosed.   Findings:  RA = 5 RV = 28/2 PA = 31/11 (18) PCW =13 Fick cardiac output/index = 5.8/2.8 PVR = 0.9 WU Ao sat = 95% PA sat = 68%, 68%  Assessment: 1. 3v CAD with patent LCx stent 2. There are high-grade lesions in the ostial LCX and mid D1 3. 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 Well-compensated hemodynamics  Plan/Discussion:  Hemodynamics well compensated. Suspect EF down out of proportion to CAD.   Will review with interventional team regarding PCI of ostial LCX and D1. Although I doubt these are promiennt contributors to his cardiomyopathy they are large vessels and have had progressive disease since 2017 and likely worth addressing giving falling EF.  EKG:  EKG is not ordered today.    Recent Labs: 06/10/2020: Magnesium 2.2 07/11/2020: TSH 3.195 07/25/2020: ALT 35 08/21/2020: B Natriuretic Peptide 116.9; BUN 57; Creatinine, Ser 2.21; Hemoglobin 12.0; Platelets 253; Potassium 4.1; Sodium 134  Recent Lipid Panel    Component Value Date/Time   CHOL 242 (H) 06/09/2020 0744   TRIG 208 (H) 06/09/2020 0744   HDL 51 06/09/2020 0744   CHOLHDL 4.7 06/09/2020 0744   VLDL 42 (H) 06/09/2020 0744   LDLCALC 149 (H) 06/09/2020 0744   LDLCALC 82 10/23/2019 0955   LDLDIRECT TEST NOT PERFORMED 06/19/2011 1230    Physical Exam:    VS:  BP 110/64   Pulse 88   Ht 5\' 10"  (1.778 m)   Wt 218 lb 6.4 oz (99.1 kg)   SpO2 97%   BMI 31.34 kg/m     Wt Readings from Last 3 Encounters:  08/27/20 218 lb 6.4 oz  (99.1 kg)  08/21/20 212 lb 6.4 oz (96.3 kg)  08/06/20 205 lb (93 kg)     GEN:  Well nourished, well developed in no acute distress HEENT: Normal NECK: No JVD; No carotid bruits LYMPHATICS: No lymphadenopathy CARDIAC: RRR, no murmurs, rubs, gallops RESPIRATORY:  Clear to auscultation without rales, wheezing or rhonchi  ABDOMEN: Soft, non-tender, non-distended MUSCULOSKELETAL:  No edema; No deformity  SKIN: Warm and dry NEUROLOGIC:  Alert and oriented x 3 PSYCHIATRIC:  Normal affect   ASSESSMENT:    1. Atherosclerosis of native coronary artery of native heart without angina pectoris   2. Chronic systolic (congestive) heart failure (HCC)    PLAN:    In order of problems listed above:  47. 59 year old gentleman with type 2 diabetes, chronic systolic heart failure with advanced LV dysfunction, and known CAD status post inferolateral STEMI in 2013 treated with primary PCI of the left circumflex.  The patient has developed progressive LV dysfunction with LVEF of 20%.  He underwent recent diagnostic catheterization with findings outlined above.  I have personally reviewed his cardiac catheterization images and compared them to his 2013 study.  He has progressive and now severe coronary obstructive disease involving the ostium of the left circumflex and the proximal portion of a large first diagonal branch.  He also appears to have developed some progressive disease through the mid right coronary artery, although this is difficult to evaluate due to catheter position in a high anterior origin of the RCA.  With multivessel disease, severe LV dysfunction, and diabetes, the patient is at high risk of further ischemic complications.  PCI seems like a reasonable treatment option in this patient without hemodynamically significant LAD stenosis.  I would plan  on performing PCI of the ostial left circumflex lesion, the high diagonal lesion, and possibly reimage the RCA for consideration of PCI versus medical  management.  The patient understands the need to remain on dual antiplatelet therapy with aspirin and clopidogrel for at least 6 months following PCI.  I personally reviewed the risks, indications, and alternatives to cardiac catheterization and PCI with the patient and his wife today. Risks include but are not limited to bleeding, infection, vascular injury, stroke, myocardial infection, arrhythmia, kidney injury, radiation-related injury in the case of prolonged fluoroscopy use, emergency cardiac surgery, and death. The patient understands the risks of serious complication is 1-2 in [id number] with diagnostic cardiac cath and 1-2% or less with angioplasty/stenting.  The patient has developed acute kidney injury comparing his baseline creatinine of 1.47 on August 12 to his creatinine of 2.21 on September 8 with interval cardiac catheterization performed August 24.  I reviewed Dr. Clayborne Dana recommendations to reduce torsemide to 40 mg only on Monday and Friday.  I have also asked him to hold losartan temporarily.  We will repeat precatheterization labs in about 2 weeks.  As long as his creatinine trends back to near baseline, would anticipate moving forward with PCI at that time.  The patient will require an overnight hospital stay because of his increased risk of contrast nephropathy and because of his severely reduced LV function.   Medication Adjustments/Labs and Tests Ordered: Current medicines are reviewed at length with the patient today.  Concerns regarding medicines are outlined above.  Orders Placed This Encounter  Procedures  . Basic metabolic panel  . CBC with Differential/Platelet   Meds ordered this encounter  Medications  . torsemide (DEMADEX) 20 MG tablet    Sig: Take 2 tablets (40 mg total) by mouth 2 (two) times a week. Take on Mondays and Fridays.    Dispense:  48 tablet    Refill:  3    Patient Instructions  Medication Instructions:  1) DECREASE TORSEMIDE to 40 mg Mondays and  Fridays only 2) STOP LOSARTAN *If you need a refill on your cardiac medications before your next appointment, please call your pharmacy*  Lab Work: Your provider recommends that you return for lab work in 2 weeks. You do not need to be fasting. You can come ANY TIME between 7:30AM and 4:30PM. If you have labs (blood work) drawn today and your tests are completely normal, you will receive your results only by: [name] MyChart Message (if you have MyChart) OR . A paper copy in the mail If you have any lab test that is abnormal or we need to change your treatment, we will call you to review the results.  Follow-Up: We will be in touch after your labs are drawn to arrange further testing!    Signed, [name], MD  08/27/2020 5:15 PM    [facility]

## 2020-08-27 NOTE — Patient Instructions (Addendum)
Medication Instructions:  1) DECREASE TORSEMIDE to 40 mg Mondays and Fridays only 2) STOP LOSARTAN *If you need a refill on your cardiac medications before your next appointment, please call your pharmacy*  Lab Work: Your provider recommends that you return for lab work in 2 weeks. You do not need to be fasting. You can come ANY TIME between 7:30AM and 4:30PM. If you have labs (blood work) drawn today and your tests are completely normal, you will receive your results only by: [name] MyChart Message (if you have MyChart) OR . A paper copy in the mail If you have any lab test that is abnormal or we need to change your treatment, we will call you to review the results.  Follow-Up: We will be in touch after your labs are drawn to arrange further testing!

## 2020-08-27 NOTE — H&P (View-Only) (Signed)
Cardiology Office Note:    Date:  08/27/2020   ID:  Antonio Cobb, DOB 12-27-1960, MRN [id number]  PCP:  [name], MD  [place] Cardiologist:  [name], MD  [facility] Electrophysiologist:  Cristopher Peru, MD   Referring MD: [name], MD   Chief Complaint  Patient presents with  . Coronary Artery Disease    History of Present Illness:    Antonio Cobb is a 59 y.o. male with a hx of CAD, presenting for interventional cardiology consultation at the request of Dr Haroldine Laws.   The patient's coronary history dates back to 2013 when he presented with ST elevation infarction treated with stenting of the left circumflex.  At that time he was noted to have moderate residual CAD involving the LAD and RCA.  He has had severe LV systolic dysfunction with LVEF less than 35% and has undergone ICD implantation.  Comorbid conditions include hypertension, mixed hyperlipidemia, type 2 diabetes, and obstructive sleep apnea.  The patient was admitted June 27 with acute heart failure/flash pulmonary edema, with rapid improvement with IV diuretics.  He was readmitted July 29 with heart failure again.  This admission required administration of BiPAP and he again improved with IV diuretic therapy.  Cardiac catheterization has demonstrated severe stenosis of the ostial left circumflex, severe stenosis of the diagonal branch and moderately severe stenosis of the mid RCA.  He is referred for consideration of PCI.  The patient is here with his wife today.  He is getting along pretty well at present.  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  He has mild shortness of breath with activity, but denies any chest pain or pressure.  He denies leg swelling, orthopnea, PND, or any recent heart palpitations.  He denies lightheadedness or syncope.  He has no right arm complaints after undergoing radial catheterization by Dr. Haroldine Laws recently.  He has been started on  clopidogrel and denies any bleeding or bruising problems.  Past Medical History:  Diagnosis Date  . Acute CHF (congestive heart failure) ([place]) 01/21/2020  . Acute on chronic combined systolic and diastolic CHF (congestive heart failure) ([place])   . Acute respiratory failure with hypoxia and hypercapnia (HCC)   . Acute systolic CHF (congestive heart failure) ([place]) 05/08/2020  . Alcohol abuse   . CAD (coronary artery disease)    a. s/p inferolateral STEMI in 05/2012 with DES to LCx b. patent stent by cath in 05/2016 with moderate RCA and LAD disease  . Cardiomyopathy ([place])    a. LVEF 30-35% by echo in 09/2016. b. 08/2017: echo showing EF remains reduced at 25-30% c. s/p [employer] ICD placement in 01/2018  . CHF (congestive heart failure) ([place])   . CHF exacerbation ([place]) 11/18/2019  . COPD (chronic obstructive pulmonary disease) ([place])   . Diabetes mellitus, type 2 ([place])   . Essential hypertension   . Hypertensive urgency   . Mitral regurgitation    Moderate  . Mixed hyperlipidemia   . Myocardial infarction ([place]) 05/23/2012   Inferolateral STEMI  . Obesity     Past Surgical History:  Procedure Laterality Date  . CARDIAC CATHETERIZATION  2 yrs ago  . CARDIAC CATHETERIZATION N/A 05/15/2016   Procedure: Right/Left Heart Cath and Coronary Angiography;  Surgeon: [name], MD;  Location: [place] CV [facility];  Service: Cardiovascular;  Laterality: N/A;  . CORONARY STENT PLACEMENT  05/23/12  . ICD IMPLANT N/A 02/01/2018   Procedure: ICD  IMPLANT;  Surgeon: Evans Lance, MD;  Location: [place] [facility];  Service: Cardiovascular;  Laterality: N/A;  . LEFT HEART CATHETERIZATION WITH CORONARY ANGIOGRAM N/A 05/23/2012   Procedure: LEFT HEART CATHETERIZATION WITH CORONARY ANGIOGRAM;  Surgeon: [name], MD;  Location: [facility] CATH LAB;  Service: Cardiovascular;  Laterality: N/A;  . None    . PERCUTANEOUS CORONARY STENT INTERVENTION (PCI-S) N/A 05/23/2012   Procedure: PERCUTANEOUS  CORONARY STENT INTERVENTION (PCI-S);  Surgeon: [name], MD;  Location: [facility] CATH LAB;  Service: Cardiovascular;  Laterality: N/A;  . POLYPECTOMY  10/16/2011   Procedure: POLYPECTOMY;  Surgeon: [name], MD;  Location: [facility];  Service: Endoscopy;;  Polypoid Lesion, Transverse and Sigmoid Colon  . RIGHT/LEFT HEART CATH AND CORONARY ANGIOGRAPHY N/A 08/06/2020   Procedure: RIGHT/LEFT HEART CATH AND CORONARY ANGIOGRAPHY;  Surgeon: Jolaine Artist, MD;  Location: [place] [facility];  Service: Cardiovascular;  Laterality: N/A;    Current Medications: Current Meds  Medication Sig  . aspirin 81 MG EC tablet Take 1 tablet (81 mg total) by mouth daily with breakfast.  . clopidogrel (PLAVIX) 75 MG tablet Take 1 tablet (75 mg total) by mouth daily.  . dapagliflozin propanediol (FARXIGA) 10 MG TABS tablet Take 1 tablet (10 mg total) by mouth daily before breakfast.  . docusate sodium (COLACE) 100 MG capsule Take 100 mg by mouth daily.  [name] esomeprazole (NEXIUM) 20 MG capsule Take 20 mg by mouth daily at 12 noon.  . famotidine (PEPCID) 20 MG tablet Take 1 tablet (20 mg total) by mouth at bedtime.  [name] glipiZIDE (GLUCOTROL XL) 5 MG 24 hr tablet Take 1 tablet (5 mg total) by mouth daily with breakfast.  . hydrOXYzine (ATARAX/VISTARIL) 25 MG tablet Take 25 mg by mouth at bedtime as needed.  . insulin glargine (LANTUS SOLOSTAR) 100 UNIT/ML Solostar Pen Inject 60 Units into the skin at bedtime.  . Melatonin 10 MG TABS Take 2 tablets by mouth at bedtime as needed (for sleep).   . metoprolol succinate (TOPROL-XL) 100 MG 24 hr tablet Take 1 tablet (100 mg total) by mouth daily. TAKE 1 TABLET BY MOUTH EVERY DAY WITH FOOD  . Multiple Vitamins-Minerals (MULTIVITAMIN WITH MINERALS) tablet Take 1 tablet by mouth daily.  . potassium chloride SA (KLOR-CON) 20 MEQ tablet Take 1 tablet (20 mEq total) by mouth daily.  . rosuvastatin (CRESTOR) 40 MG tablet Take 1 tablet (40 mg total) by mouth daily.  . sertraline  (ZOLOFT) 25 MG tablet Take 1 tablet (25 mg total) by mouth daily. For anxiety/panic attacks  . spironolactone (ALDACTONE) 25 MG tablet Take 1 tablet (25 mg total) by mouth daily.  Derrill Memo ON 08/29/2020] torsemide (DEMADEX) 20 MG tablet Take 2 tablets (40 mg total) by mouth 2 (two) times a week. Take on Mondays and Fridays.  . [DISCONTINUED] losartan (COZAAR) 25 MG tablet Take 1 tablet (25 mg total) by mouth daily.  . [DISCONTINUED] torsemide (DEMADEX) 20 MG tablet Take 40 mg by mouth daily.     Allergies:   Entresto [sacubitril-valsartan] and Other   Social History   Socioeconomic History  . Marital status: Married    Spouse name: Not on file  . Number of children: Not on file  . Years of education: Not on file  . Highest education level: Not on file  Occupational History  . Occupation: Therapist, sports: [employer]  Tobacco Use  . Smoking status: Former Smoker    Packs/day: 0.25  Years: 30.00    Pack years: 7.50    Types: Cigars    Quit date: 05/23/2016    Years since quitting: 4.2  . Smokeless tobacco: Never Used  . Tobacco comment: since early 16s  Vaping Use  . Vaping Use: Never used  Substance and Sexual Activity  . Alcohol use: Yes    Alcohol/week: 0.0 standard drinks    Comment: occ  . Drug use: No  . Sexual activity: Not Currently  Other Topics Concern  . Not on file  Social History Narrative  . Not on file   Social Determinants of Health   Financial Resource Strain:   . Difficulty of Paying Living Expenses: Not on file  Food Insecurity:   . Worried About Charity fundraiser in the Last Year: Not on file  . Ran Out of Food in the Last Year: Not on file  Transportation Needs:   . Lack of Transportation (Medical): Not on file  . Lack of Transportation (Non-Medical): Not on file  Physical Activity:   . Days of Exercise per Week: Not on file  . Minutes of Exercise per Session: Not on file  Stress:   . Feeling of Stress : Not on file  Social  Connections:   . Frequency of Communication with Friends and Family: Not on file  . Frequency of Social Gatherings with Friends and Family: Not on file  . Attends Religious Services: Not on file  . Active Member of Clubs or Organizations: Not on file  . Attends Archivist Meetings: Not on file  . Marital Status: Not on file     Family History: The patient's family history includes Asthma in his sister; COPD in his father and mother; Diabetes in his mother; Emphysema in his father and mother; Heart attack in his mother; Heart disease in his mother; Hypertension in his mother; Stroke in his sister. There is no history of Colon cancer, Anesthesia problems, Hypotension, Malignant hyperthermia, or Pseudochol deficiency.  ROS:   Please see the history of present illness.    All other systems reviewed and are negative.  EKGs/Labs/Other Studies Reviewed:    The following studies were reviewed today: Echo 05/09/2020: IMPRESSIONS    1. Left ventricular ejection fraction, by estimation, is 20%. The left  ventricle has severely decreased function. The left ventricle demonstrates  regional wall motion abnormalities (see scoring diagram/findings for  description). The left ventricular  internal cavity size was mildly dilated. There is mild left ventricular  hypertrophy. Left ventricular diastolic parameters are consistent with  Grade II diastolic dysfunction (pseudonormalization).  2. Right ventricular systolic function is normal. The right ventricular  size is normal. There is normal pulmonary artery systolic pressure. The  estimated right ventricular systolic pressure is 32.6 mmHg.  3. Left atrial size was upper normal.  4. The mitral valve is grossly normal. Mild mitral valve regurgitation.  5. The aortic valve is tricuspid. Aortic valve regurgitation is trivial.  6. The inferior vena cava is dilated in size with >50% respiratory  variability, suggesting right atrial pressure  of 8 mmHg.   Cardiac Cath 08/06/2020: Conclusion    Mid LAD lesion is 50% stenosed.  Ost 2nd Diag to 2nd Diag lesion is 70% stenosed.  Ost 1st Diag to 1st Diag lesion is 25% stenosed.  Ost Cx lesion is 85% stenosed.  Mid RCA lesion is 50% stenosed.  Previously placed Prox Cx to Dist Cx stent (unknown type) is widely patent.  2nd Mrg lesion is  50% stenosed.  Dist Cx lesion is 50% stenosed.  1st Diag lesion is 90% stenosed.  Dist LAD lesion is 50% stenosed.   Findings:  RA = 5 RV = 28/2 PA = 31/11 (18) PCW =13 Fick cardiac output/index = 5.8/2.8 PVR = 0.9 WU Ao sat = 95% PA sat = 68%, 68%  Assessment: 1. 3v CAD with patent LCx stent 2. There are high-grade lesions in the ostial LCX and mid D1 3. 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 Well-compensated hemodynamics  Plan/Discussion:  Hemodynamics well compensated. Suspect EF down out of proportion to CAD.   Will review with interventional team regarding PCI of ostial LCX and D1. Although I doubt these are promiennt contributors to his cardiomyopathy they are large vessels and have had progressive disease since 2017 and likely worth addressing giving falling EF.  EKG:  EKG is not ordered today.    Recent Labs: 06/10/2020: Magnesium 2.2 07/11/2020: TSH 3.195 07/25/2020: ALT 35 08/21/2020: B Natriuretic Peptide 116.9; BUN 57; Creatinine, Ser 2.21; Hemoglobin 12.0; Platelets 253; Potassium 4.1; Sodium 134  Recent Lipid Panel    Component Value Date/Time   CHOL 242 (H) 06/09/2020 0744   TRIG 208 (H) 06/09/2020 0744   HDL 51 06/09/2020 0744   CHOLHDL 4.7 06/09/2020 0744   VLDL 42 (H) 06/09/2020 0744   LDLCALC 149 (H) 06/09/2020 0744   LDLCALC 82 10/23/2019 0955   LDLDIRECT TEST NOT PERFORMED 06/19/2011 1230    Physical Exam:    VS:  BP 110/64   Pulse 88   Ht 5\' 10"  (1.778 m)   Wt 218 lb 6.4 oz (99.1 kg)   SpO2 97%   BMI 31.34 kg/m     Wt Readings from Last 3 Encounters:  08/27/20 218 lb 6.4 oz  (99.1 kg)  08/21/20 212 lb 6.4 oz (96.3 kg)  08/06/20 205 lb (93 kg)     GEN:  Well nourished, well developed in no acute distress HEENT: Normal NECK: No JVD; No carotid bruits LYMPHATICS: No lymphadenopathy CARDIAC: RRR, no murmurs, rubs, gallops RESPIRATORY:  Clear to auscultation without rales, wheezing or rhonchi  ABDOMEN: Soft, non-tender, non-distended MUSCULOSKELETAL:  No edema; No deformity  SKIN: Warm and dry NEUROLOGIC:  Alert and oriented x 3 PSYCHIATRIC:  Normal affect   ASSESSMENT:    1. Atherosclerosis of native coronary artery of native heart without angina pectoris   2. Chronic systolic (congestive) heart failure (HCC)    PLAN:    In order of problems listed above:  48. 59 year old gentleman with type 2 diabetes, chronic systolic heart failure with advanced LV dysfunction, and known CAD status post inferolateral STEMI in 2013 treated with primary PCI of the left circumflex.  The patient has developed progressive LV dysfunction with LVEF of 20%.  He underwent recent diagnostic catheterization with findings outlined above.  I have personally reviewed his cardiac catheterization images and compared them to his 2013 study.  He has progressive and now severe coronary obstructive disease involving the ostium of the left circumflex and the proximal portion of a large first diagonal branch.  He also appears to have developed some progressive disease through the mid right coronary artery, although this is difficult to evaluate due to catheter position in a high anterior origin of the RCA.  With multivessel disease, severe LV dysfunction, and diabetes, the patient is at high risk of further ischemic complications.  PCI seems like a reasonable treatment option in this patient without hemodynamically significant LAD stenosis.  I would plan  on performing PCI of the ostial left circumflex lesion, the high diagonal lesion, and possibly reimage the RCA for consideration of PCI versus medical  management.  The patient understands the need to remain on dual antiplatelet therapy with aspirin and clopidogrel for at least 6 months following PCI.  I personally reviewed the risks, indications, and alternatives to cardiac catheterization and PCI with the patient and his wife today. Risks include but are not limited to bleeding, infection, vascular injury, stroke, myocardial infection, arrhythmia, kidney injury, radiation-related injury in the case of prolonged fluoroscopy use, emergency cardiac surgery, and death. The patient understands the risks of serious complication is 1-2 in 6384 with diagnostic cardiac cath and 1-2% or less with angioplasty/stenting.  The patient has developed acute kidney injury comparing his baseline creatinine of 1.47 on August 12 to his creatinine of 2.21 on September 8 with interval cardiac catheterization performed August 24.  I reviewed Dr. Clayborne Dana recommendations to reduce torsemide to 40 mg only on Monday and Friday.  I have also asked him to hold losartan temporarily.  We will repeat precatheterization labs in about 2 weeks.  As long as his creatinine trends back to near baseline, would anticipate moving forward with PCI at that time.  The patient will require an overnight hospital stay because of his increased risk of contrast nephropathy and because of his severely reduced LV function.   Medication Adjustments/Labs and Tests Ordered: Current medicines are reviewed at length with the patient today.  Concerns regarding medicines are outlined above.  Orders Placed This Encounter  Procedures  . Basic metabolic panel  . CBC with Differential/Platelet   Meds ordered this encounter  Medications  . torsemide (DEMADEX) 20 MG tablet    Sig: Take 2 tablets (40 mg total) by mouth 2 (two) times a week. Take on Mondays and Fridays.    Dispense:  48 tablet    Refill:  3    Patient Instructions  Medication Instructions:  1) DECREASE TORSEMIDE to 40 mg Mondays and  Fridays only 2) STOP LOSARTAN *If you need a refill on your cardiac medications before your next appointment, please call your pharmacy*  Lab Work: Your provider recommends that you return for lab work in 2 weeks. You do not need to be fasting. You can come ANY TIME between 7:30AM and 4:30PM. If you have labs (blood work) drawn today and your tests are completely normal, you will receive your results only by: [name] MyChart Message (if you have MyChart) OR . A paper copy in the mail If you have any lab test that is abnormal or we need to change your treatment, we will call you to review the results.  Follow-Up: We will be in touch after your labs are drawn to arrange further testing!    Signed, [name], MD  08/27/2020 5:15 PM    [facility]

## 2020-08-28 ENCOUNTER — Ambulatory Visit: Payer: BC Managed Care – PPO | Admitting: Family Medicine

## 2020-08-29 ENCOUNTER — Telehealth: Payer: Self-pay

## 2020-08-29 ENCOUNTER — Ambulatory Visit: Payer: BC Managed Care – PPO | Admitting: "Endocrinology

## 2020-08-29 NOTE — Telephone Encounter (Signed)
fyi

## 2020-08-29 NOTE — Telephone Encounter (Signed)
Antonio Cobb, This patient has been dismissed as of today per Dr Dorris Fetch for non compliance/cancelation's. Just wanted to let you know so you could let Dr Moshe Cipro know. He will be mailed a letter today.   Thanks [place]

## 2020-08-29 NOTE — Telephone Encounter (Signed)
Patient has been dismissed from [facility] as of 9/16 per Dr Dorris Fetch for non compliance. Mailed letter 9/16

## 2020-09-01 NOTE — Telephone Encounter (Signed)
Pls let pt know of his dismissal, explain he will need to go to Endo in [place] as there are no other Docs in [facility], and his diabetes is severe and uncontrolled so he needs tobe managed by Specialist. Once he is aware pls refer him to Dr Danae Chen is hyperglycemia, insulin dependent, I will sign, thanks

## 2020-09-02 ENCOUNTER — Other Ambulatory Visit: Payer: Self-pay | Admitting: *Deleted

## 2020-09-02 ENCOUNTER — Telehealth (HOSPITAL_COMMUNITY): Payer: Self-pay

## 2020-09-02 DIAGNOSIS — Z794 Long term (current) use of insulin: Secondary | ICD-10-CM

## 2020-09-02 NOTE — Telephone Encounter (Signed)
Malena Edman, RN  09/02/2020 4:25 PM EDT Back to Top    Patient advised and verbalized understanding. Med list updated   Malena Edman, RN  08/30/2020 2:46 PM EDT     [facility]   Malena Edman, RN  08/27/2020 5:08 PM EDT     Tried calling patient, no answer,unable to leave message. Will try again later.

## 2020-09-02 NOTE — Telephone Encounter (Signed)
Pt notified pf his dismissal with verbal understanding. Referral placed for dr Renne Crigler.

## 2020-09-02 NOTE — Telephone Encounter (Signed)
-----   Message from Jolaine Artist, MD sent at 08/27/2020 12:29 PM EDT ----- He is dry. Switch torsemide to 40mg  Mon and Friday only. Can take more as needed.

## 2020-09-05 ENCOUNTER — Ambulatory Visit: Payer: BC Managed Care – PPO | Admitting: "Endocrinology

## 2020-09-10 DIAGNOSIS — G4733 Obstructive sleep apnea (adult) (pediatric): Secondary | ICD-10-CM | POA: Diagnosis not present

## 2020-09-11 ENCOUNTER — Other Ambulatory Visit: Payer: BC Managed Care – PPO | Admitting: *Deleted

## 2020-09-11 ENCOUNTER — Other Ambulatory Visit: Payer: Self-pay

## 2020-09-11 DIAGNOSIS — I2510 Atherosclerotic heart disease of native coronary artery without angina pectoris: Secondary | ICD-10-CM | POA: Diagnosis not present

## 2020-09-12 ENCOUNTER — Other Ambulatory Visit: Payer: Self-pay | Admitting: Family Medicine

## 2020-09-12 ENCOUNTER — Telehealth: Payer: Self-pay | Admitting: Family Medicine

## 2020-09-12 ENCOUNTER — Telehealth: Payer: Self-pay | Admitting: Cardiovascular Disease

## 2020-09-12 ENCOUNTER — Ambulatory Visit (INDEPENDENT_AMBULATORY_CARE_PROVIDER_SITE_OTHER): Payer: BC Managed Care – PPO | Admitting: Family Medicine

## 2020-09-12 VITALS — BP 110/70 | HR 88 | Ht 70.0 in

## 2020-09-12 DIAGNOSIS — Z23 Encounter for immunization: Secondary | ICD-10-CM | POA: Diagnosis not present

## 2020-09-12 DIAGNOSIS — J309 Allergic rhinitis, unspecified: Secondary | ICD-10-CM

## 2020-09-12 LAB — CBC WITH DIFFERENTIAL/PLATELET
Basophils Absolute: 0.1 10*3/uL (ref 0.0–0.2)
Basos: 1 %
EOS (ABSOLUTE): 0.4 10*3/uL (ref 0.0–0.4)
Eos: 4 %
Hematocrit: 38.5 % (ref 37.5–51.0)
Hemoglobin: 12.7 g/dL — ABNORMAL LOW (ref 13.0–17.7)
Immature Grans (Abs): 0.1 10*3/uL (ref 0.0–0.1)
Immature Granulocytes: 1 %
Lymphocytes Absolute: 2.3 10*3/uL (ref 0.7–3.1)
Lymphs: 24 %
MCH: 28.3 pg (ref 26.6–33.0)
MCHC: 33 g/dL (ref 31.5–35.7)
MCV: 86 fL (ref 79–97)
Monocytes Absolute: 1 10*3/uL — ABNORMAL HIGH (ref 0.1–0.9)
Monocytes: 10 %
Neutrophils Absolute: 5.6 10*3/uL (ref 1.4–7.0)
Neutrophils: 60 %
Platelets: 279 10*3/uL (ref 150–450)
RBC: 4.48 x10E6/uL (ref 4.14–5.80)
RDW: 14.5 % (ref 11.6–15.4)
WBC: 9.4 10*3/uL (ref 3.4–10.8)

## 2020-09-12 LAB — BASIC METABOLIC PANEL
BUN/Creatinine Ratio: 17 (ref 10–24)
BUN: 25 mg/dL (ref 8–27)
CO2: 19 mmol/L — ABNORMAL LOW (ref 20–29)
Calcium: 9.2 mg/dL (ref 8.6–10.2)
Chloride: 104 mmol/L (ref 96–106)
Creatinine, Ser: 1.49 mg/dL — ABNORMAL HIGH (ref 0.76–1.27)
GFR calc Af Amer: 58 mL/min/{1.73_m2} — ABNORMAL LOW (ref >59)
GFR calc non Af Amer: 50 mL/min/{1.73_m2} — ABNORMAL LOW (ref >59)
Glucose: 128 mg/dL — ABNORMAL HIGH (ref 65–99)
Potassium: 4.8 mmol/L (ref 3.5–5.2)
Sodium: 142 mmol/L (ref 134–144)

## 2020-09-12 MED ORDER — MONTELUKAST SODIUM 10 MG PO TABS: 10.0000 mg | ORAL_TABLET | Freq: Every day | ORAL | 3 refills | Status: DC

## 2020-09-12 MED ORDER — FLUTICASONE PROPIONATE 50 MCG/ACT NA SUSP: 2.0000 | Freq: Every day | NASAL | 6 refills | Status: DC

## 2020-09-12 MED ORDER — CHLORPHENIRAMINE MALEATE 4 MG PO TABS: ORAL_TABLET | ORAL | 0 refills | Status: AC

## 2020-09-12 NOTE — Telephone Encounter (Signed)
C/o excess nasal congestion and runny nose x 1 week Chlorpheniramine, montelukast and flonase

## 2020-09-12 NOTE — Telephone Encounter (Signed)
Left message to call back  

## 2020-09-12 NOTE — Progress Notes (Signed)
Flu Shot Only

## 2020-09-12 NOTE — Telephone Encounter (Signed)
    Pt's wife returning 61 call

## 2020-09-12 NOTE — Telephone Encounter (Signed)
Scheduled the patient for PCI 10/6 with Dr. Burt Knack. Quickly reviewed instructions with wife, who requested instructions are printed out and placed at the front desk for pick up as well. She was grateful for assistance.

## 2020-09-13 NOTE — Progress Notes (Signed)
Acute Office Visit  Subjective:    Patient ID: Antonio Cobb, male    DOB: 1961/06/23, 59 y.o.   MRN: [id number]  Chief Complaint  Patient presents with  . Allergic Rhinitis     HPI Done as phone visit except that BP eas checked. Pt alos recwived the flu vaccine Patient is in today for excess allergy symptoms, runny nose , watery, and uncontrolled allergy symptoms, he is in the [place] with his wife who is scheduled to be seen, and he also is obtaining the flu vaccine Has upcoming cardiology procedure next week  Past Medical History:  Diagnosis Date  . Acute CHF (congestive heart failure) ([place]) 01/21/2020  . Acute on chronic combined systolic and diastolic CHF (congestive heart failure) ([place])   . Acute respiratory failure with hypoxia and hypercapnia ([facility])   . Acute systolic CHF (congestive heart failure) ([place]) 05/08/2020  . Alcohol abuse   . CAD (coronary artery disease)    a. s/p inferolateral STEMI in 05/2012 with DES to LCx b. patent stent by cath in 05/2016 with moderate RCA and LAD disease  . Cardiomyopathy ([place])    a. LVEF 30-35% by echo in 09/2016. b. 08/2017: echo showing EF remains reduced at 25-30% c. s/p [employer] ICD placement in 01/2018  . CHF (congestive heart failure) ([place])   . CHF exacerbation ([place]) 11/18/2019  . COPD (chronic obstructive pulmonary disease) ([place])   . Diabetes mellitus, type 2 ([place])   . Essential hypertension   . Hypertensive urgency   . Mitral regurgitation    Moderate  . Mixed hyperlipidemia   . Myocardial infarction ([place]) 05/23/2012   Inferolateral STEMI  . Obesity     Past Surgical History:  Procedure Laterality Date  . CARDIAC CATHETERIZATION  2 yrs ago  . CARDIAC CATHETERIZATION N/A 05/15/2016   Procedure: Right/Left Heart Cath and Coronary Angiography;  Surgeon: [name], MD;  Location: [facility];  Service: Cardiovascular;  Laterality: N/A;  . CORONARY STENT PLACEMENT  05/23/12  . ICD IMPLANT N/A 02/01/2018     Procedure: ICD IMPLANT;  Surgeon: Evans Lance, MD;  Location: [facility];  Service: Cardiovascular;  Laterality: N/A;  . LEFT HEART CATHETERIZATION WITH CORONARY ANGIOGRAM N/A 05/23/2012   Procedure: LEFT HEART CATHETERIZATION WITH CORONARY ANGIOGRAM;  Surgeon: [name], MD;  Location: [facility] CATH LAB;  Service: Cardiovascular;  Laterality: N/A;  . None    . PERCUTANEOUS CORONARY STENT INTERVENTION (PCI-S) N/A 05/23/2012   Procedure: PERCUTANEOUS CORONARY STENT INTERVENTION (PCI-S);  Surgeon: [name], MD;  Location: [facility] CATH LAB;  Service: Cardiovascular;  Laterality: N/A;  . POLYPECTOMY  10/16/2011   Procedure: POLYPECTOMY;  Surgeon: [name], MD;  Location: [facility];  Service: Endoscopy;;  Polypoid Lesion, Transverse and Sigmoid Colon  . RIGHT/LEFT HEART CATH AND CORONARY ANGIOGRAPHY N/A 08/06/2020   Procedure: RIGHT/LEFT HEART CATH AND CORONARY ANGIOGRAPHY;  Surgeon: Jolaine Artist, MD;  Location: [facility];  Service: Cardiovascular;  Laterality: N/A;    Family History  Problem Relation Age of Onset  . Emphysema Mother   . Heart attack Mother   . Hypertension Mother   . Diabetes Mother   . Heart disease Mother   . COPD Mother   . Emphysema Father   . COPD Father   . Stroke Sister   . Asthma Sister   . Colon cancer Neg Hx   . Anesthesia problems Neg Hx   . Hypotension Neg Hx   .  Malignant hyperthermia Neg Hx   . Pseudochol deficiency Neg Hx     Social History   Socioeconomic History  . Marital status: Married    Spouse name: Not on file  . Number of children: Not on file  . Years of education: Not on file  . Highest education level: Not on file  Occupational History  . Occupation: Therapist, sports: [employer]  Tobacco Use  . Smoking status: Former Smoker    Packs/day: 0.25    Years: 30.00    Pack years: 7.50    Types: Cigars    Quit date: 05/23/2016    Years since quitting: 4.3  . Smokeless tobacco: Never Used  .  Tobacco comment: since early 26s  Vaping Use  . Vaping Use: Never used  Substance and Sexual Activity  . Alcohol use: Yes    Alcohol/week: 0.0 standard drinks    Comment: occ  . Drug use: No  . Sexual activity: Not Currently  Other Topics Concern  . Not on file  Social History Narrative  . Not on file   Social Determinants of Health   Financial Resource Strain:   . Difficulty of Paying Living Expenses: Not on file  Food Insecurity:   . Worried About Charity fundraiser in the Last Year: Not on file  . Ran Out of Food in the Last Year: Not on file  Transportation Needs:   . Lack of Transportation (Medical): Not on file  . Lack of Transportation (Non-Medical): Not on file  Physical Activity:   . Days of Exercise per Week: Not on file  . Minutes of Exercise per Session: Not on file  Stress:   . Feeling of Stress : Not on file  Social Connections:   . Frequency of Communication with Friends and Family: Not on file  . Frequency of Social Gatherings with Friends and Family: Not on file  . Attends Religious Services: Not on file  . Active Member of Clubs or Organizations: Not on file  . Attends Archivist Meetings: Not on file  . Marital Status: Not on file  Intimate Partner Violence:   . Fear of Current or Ex-Partner: Not on file  . Emotionally Abused: Not on file  . Physically Abused: Not on file  . Sexually Abused: Not on file    Outpatient Medications Prior to Visit  Medication Sig Dispense Refill  . aspirin 81 MG EC tablet Take 1 tablet (81 mg total) by mouth daily with breakfast. 90 tablet 3  . chlorpheniramine (CHLOR-TRIMETON) 4 MG tablet Take one tablet by mouth once daily, as needed, for uncontrolled allergies 30 tablet 0  . clopidogrel (PLAVIX) 75 MG tablet Take 1 tablet (75 mg total) by mouth daily. 30 tablet 4  . dapagliflozin propanediol (FARXIGA) 10 MG TABS tablet Take 1 tablet (10 mg total) by mouth daily before breakfast. 30 tablet 3  . docusate  sodium (COLACE) 100 MG capsule Take 100 mg by mouth daily.    [name] esomeprazole (NEXIUM) 20 MG capsule Take 20 mg by mouth daily at 12 noon.    . famotidine (PEPCID) 20 MG tablet Take 1 tablet (20 mg total) by mouth at bedtime.    . fluticasone (FLONASE) 50 MCG/ACT nasal spray Place 2 sprays into both nostrils daily. 16 g 6  . FREESTYLE LITE test strip USE TO TEST FOUR TIMES DAILY 100 strip 0  . glipiZIDE (GLUCOTROL XL) 5 MG 24 hr tablet Take 1 tablet (  5 mg total) by mouth daily with breakfast. 30 tablet 3  . hydrOXYzine (ATARAX/VISTARIL) 25 MG tablet Take 25 mg by mouth at bedtime as needed.    . insulin glargine (LANTUS SOLOSTAR) 100 UNIT/ML Solostar Pen Inject 60 Units into the skin at bedtime. 10 pen 2  . Melatonin 10 MG TABS Take 2 tablets by mouth at bedtime as needed (for sleep).     . metoprolol succinate (TOPROL-XL) 100 MG 24 hr tablet Take 1 tablet (100 mg total) by mouth daily. TAKE 1 TABLET BY MOUTH EVERY DAY WITH FOOD 30 tablet 3  . montelukast (SINGULAIR) 10 MG tablet Take 1 tablet (10 mg total) by mouth at bedtime. 30 tablet 3  . Multiple Vitamins-Minerals (MULTIVITAMIN WITH MINERALS) tablet Take 1 tablet by mouth daily. 120 tablet 2  . potassium chloride SA (KLOR-CON) 20 MEQ tablet Take 1 tablet (20 mEq total) by mouth daily. 90 tablet 3  . rosuvastatin (CRESTOR) 40 MG tablet Take 1 tablet (40 mg total) by mouth daily. 30 tablet 3  . sertraline (ZOLOFT) 25 MG tablet Take 1 tablet (25 mg total) by mouth daily. For anxiety/panic attacks 30 tablet 5  . spironolactone (ALDACTONE) 25 MG tablet Take 1 tablet (25 mg total) by mouth daily. 90 tablet 3  . torsemide (DEMADEX) 20 MG tablet Take 2 tablets (40 mg total) by mouth 2 (two) times a week. Take on Mondays and Fridays. 48 tablet 3   No facility-administered medications prior to visit.    Allergies  Allergen Reactions  . Entresto [Sacubitril-Valsartan]     Dizziness, Coughing  . Other     Seasonal allergies  - has to use inhaler     Review of Systems     Objective:    Physical Exam  BP 110/70   Pulse 88   Ht 5\' 10"  (1.778 m)   BMI 31.34 kg/m   Excessive nasal congestion with clear watery drainage, no sinus tenderness Chest CTA Wt Readings from Last 3 Encounters:  08/27/20 218 lb 6.4 oz (99.1 kg)  08/21/20 212 lb 6.4 oz (96.3 kg)  08/06/20 205 lb (93 kg)    There are no preventive care reminders to display for this patient.  There are no preventive care reminders to display for this patient.   Lab Results  Component Value Date   TSH 3.195 07/11/2020   Lab Results  Component Value Date   WBC 9.4 09/11/2020   HGB 12.7 (L) 09/11/2020   HCT 38.5 09/11/2020   MCV 86 09/11/2020   PLT 279 09/11/2020   Lab Results  Component Value Date   NA 142 09/11/2020   K 4.8 09/11/2020   CO2 19 (L) 09/11/2020   GLUCOSE 128 (H) 09/11/2020   BUN 25 09/11/2020   CREATININE 1.49 (H) 09/11/2020   BILITOT 0.6 07/25/2020   ALKPHOS 78 07/25/2020   AST 25 07/25/2020   ALT 35 07/25/2020   PROT 6.9 07/25/2020   ALBUMIN 3.3 (L) 07/25/2020   CALCIUM 9.2 09/11/2020   ANIONGAP 13 08/21/2020   Lab Results  Component Value Date   CHOL 242 (H) 06/09/2020   Lab Results  Component Value Date   HDL 51 06/09/2020   Lab Results  Component Value Date   LDLCALC 149 (H) 06/09/2020   Lab Results  Component Value Date   TRIG 208 (H) 06/09/2020   Lab Results  Component Value Date   CHOLHDL 4.7 06/09/2020   Lab Results  Component Value Date   HGBA1C 10.2 (  H) 05/08/2020       Assessment & Plan:  Allergic rhinitis Uncontrolled, flonase , singulair and chlorpheniramine prescribed, also recommended netty pot usage daily   Problem List Items Addressed This Visit    None    Visit Diagnoses    Need for immunization against influenza    -  Primary   Relevant Orders   Flu Vaccine QUAD 36+ mos IM (Completed)   Flu Vaccine QUAD 36+ mos IM (Completed)       No orders of the defined types were placed in  this encounter.    Tula Nakayama, MD

## 2020-09-13 NOTE — Assessment & Plan Note (Signed)
Uncontrolled, flonase , singulair and chlorpheniramine prescribed, also recommended netty pot usage daily

## 2020-09-13 NOTE — Patient Instructions (Addendum)
Annual physical exam in office with MD in December, cal if you need me sooner  Three medications are prescribed for uncontrolled allergies  Flu vaccine administered today in  the office   Feasting lipid, cmp and EGFr and PSA  1 week before December visit

## 2020-09-16 ENCOUNTER — Other Ambulatory Visit: Payer: Self-pay | Admitting: *Deleted

## 2020-09-16 ENCOUNTER — Telehealth: Payer: Self-pay | Admitting: *Deleted

## 2020-09-16 ENCOUNTER — Ambulatory Visit (INDEPENDENT_AMBULATORY_CARE_PROVIDER_SITE_OTHER): Payer: BC Managed Care – PPO

## 2020-09-16 ENCOUNTER — Other Ambulatory Visit (HOSPITAL_COMMUNITY)
Admission: RE | Admit: 2020-09-16 | Discharge: 2020-09-16 | Disposition: A | Discharge status: Home / Self Care | Payer: BC Managed Care – PPO | Source: Ambulatory Visit | Attending: Cardiovascular Disease | Admitting: Cardiovascular Disease

## 2020-09-16 DIAGNOSIS — Z95810 Presence of automatic (implantable) cardiac defibrillator: Secondary | ICD-10-CM | POA: Diagnosis not present

## 2020-09-16 DIAGNOSIS — E782 Mixed hyperlipidemia: Secondary | ICD-10-CM

## 2020-09-16 DIAGNOSIS — Z01812 Encounter for preprocedural laboratory examination: Secondary | ICD-10-CM | POA: Diagnosis not present

## 2020-09-16 DIAGNOSIS — Z20822 Contact with and (suspected) exposure to covid-19: Secondary | ICD-10-CM | POA: Diagnosis not present

## 2020-09-16 DIAGNOSIS — I5022 Chronic systolic (congestive) heart failure: Secondary | ICD-10-CM | POA: Diagnosis not present

## 2020-09-16 DIAGNOSIS — I10 Essential (primary) hypertension: Secondary | ICD-10-CM

## 2020-09-16 DIAGNOSIS — Z125 Encounter for screening for malignant neoplasm of prostate: Secondary | ICD-10-CM

## 2020-09-16 LAB — SARS CORONAVIRUS 2 (TAT 6-24 HRS): SARS Coronavirus 2: NEGATIVE

## 2020-09-16 NOTE — Telephone Encounter (Signed)
LMTCB for pt to review procedure instructions 

## 2020-09-16 NOTE — Telephone Encounter (Addendum)
Pt contacted pre-coronary stent intervention  scheduled at [facility] for: Wednesday September 18, 2020 10 AM Verified arrival time and place: [facility]) at: 8 AM   No solid food after midnight prior to cath, clear liquids until 5 AM day of procedure.  Hold: Aldactone/KCl- hold day before and day of procedure-GFR 80 Torsemide-pt takes 2X/week and states days can vary-knows not to take day before and day of procedure Farixga-AM of procedure Glipizide-AM of procedure Insulin-AM of procedure Insulin-1/2 usual HS dose PM prior to procedure  Except hold medications AM meds can be  taken pre-cath with sips of water including: ASA 81 mg Plavix 75 mg  Confirmed patient has responsible adult to drive home post procedure and be with patient first 24 hours after arriving home: yes  You are allowed ONE visitor in the waiting room during the time you are at the hospital for your procedure. Both you and your visitor must wear a mask once you enter the hospital.       COVID-19 Pre-Screening Questions:  . In the past 14 days have you had a new cough, new headache, new nasal congestion, fever (100.4 or greater) unexplained body aches, new sore throat, or sudden loss of taste or sense of smell? no . In the past 14 days have you been around anyone with known Covid 19? no  . Have you been vaccinated for COVID-19? Yes, see immunization history                          Per Dr Burt Knack 09/13/20- IV flow rate 75 cc/hr on arrival no bolus                                                             -okay to hold diuretics day before and day of procedure.

## 2020-09-16 NOTE — Telephone Encounter (Addendum)
Reviewed procedure/mask/visitor instructions, COVID-19 questions with patient. 

## 2020-09-18 ENCOUNTER — Other Ambulatory Visit: Payer: Self-pay

## 2020-09-18 ENCOUNTER — Ambulatory Visit (HOSPITAL_COMMUNITY)
Admission: RE | Disposition: A | Discharge status: Home / Self Care | Payer: Self-pay | Source: Home / Self Care | Attending: Cardiovascular Disease

## 2020-09-18 ENCOUNTER — Ambulatory Visit (HOSPITAL_COMMUNITY)
Admission: RE | Admit: 2020-09-18 | Discharge: 2020-09-18 | Disposition: A | Discharge status: Home / Self Care | Payer: BC Managed Care – PPO | Attending: Cardiovascular Disease | Admitting: Cardiovascular Disease

## 2020-09-18 ENCOUNTER — Other Ambulatory Visit (HOSPITAL_COMMUNITY): Payer: Self-pay | Admitting: Cardiovascular Disease

## 2020-09-18 DIAGNOSIS — Z6831 Body mass index (BMI) 31.0-31.9, adult: Secondary | ICD-10-CM | POA: Diagnosis not present

## 2020-09-18 DIAGNOSIS — Z7982 Long term (current) use of aspirin: Secondary | ICD-10-CM | POA: Diagnosis not present

## 2020-09-18 DIAGNOSIS — E119 Type 2 diabetes mellitus without complications: Secondary | ICD-10-CM | POA: Insufficient documentation

## 2020-09-18 DIAGNOSIS — I110 Hypertensive heart disease with heart failure: Secondary | ICD-10-CM | POA: Insufficient documentation

## 2020-09-18 DIAGNOSIS — E782 Mixed hyperlipidemia: Secondary | ICD-10-CM | POA: Diagnosis not present

## 2020-09-18 DIAGNOSIS — I5042 Chronic combined systolic (congestive) and diastolic (congestive) heart failure: Secondary | ICD-10-CM | POA: Insufficient documentation

## 2020-09-18 DIAGNOSIS — Z95820 Peripheral vascular angioplasty status with implants and grafts: Secondary | ICD-10-CM

## 2020-09-18 DIAGNOSIS — I428 Other cardiomyopathies: Secondary | ICD-10-CM | POA: Diagnosis not present

## 2020-09-18 DIAGNOSIS — Z87891 Personal history of nicotine dependence: Secondary | ICD-10-CM | POA: Insufficient documentation

## 2020-09-18 DIAGNOSIS — Z7984 Long term (current) use of oral hypoglycemic drugs: Secondary | ICD-10-CM | POA: Insufficient documentation

## 2020-09-18 DIAGNOSIS — I2510 Atherosclerotic heart disease of native coronary artery without angina pectoris: Secondary | ICD-10-CM | POA: Diagnosis not present

## 2020-09-18 DIAGNOSIS — I5022 Chronic systolic (congestive) heart failure: Secondary | ICD-10-CM | POA: Diagnosis present

## 2020-09-18 DIAGNOSIS — I252 Old myocardial infarction: Secondary | ICD-10-CM | POA: Insufficient documentation

## 2020-09-18 DIAGNOSIS — J449 Chronic obstructive pulmonary disease, unspecified: Secondary | ICD-10-CM | POA: Diagnosis not present

## 2020-09-18 DIAGNOSIS — I080 Rheumatic disorders of both mitral and aortic valves: Secondary | ICD-10-CM | POA: Insufficient documentation

## 2020-09-18 DIAGNOSIS — Z7902 Long term (current) use of antithrombotics/antiplatelets: Secondary | ICD-10-CM | POA: Insufficient documentation

## 2020-09-18 DIAGNOSIS — Z79899 Other long term (current) drug therapy: Secondary | ICD-10-CM | POA: Insufficient documentation

## 2020-09-18 DIAGNOSIS — Z794 Long term (current) use of insulin: Secondary | ICD-10-CM | POA: Diagnosis not present

## 2020-09-18 DIAGNOSIS — G4733 Obstructive sleep apnea (adult) (pediatric): Secondary | ICD-10-CM | POA: Insufficient documentation

## 2020-09-18 DIAGNOSIS — E669 Obesity, unspecified: Secondary | ICD-10-CM | POA: Insufficient documentation

## 2020-09-18 HISTORY — PX: INTRAVASCULAR PRESSURE WIRE/FFR STUDY: CATH118243

## 2020-09-18 HISTORY — PX: CORONARY STENT INTERVENTION: CATH118234

## 2020-09-18 LAB — POCT ACTIVATED CLOTTING TIME
Activated Clotting Time: 252 seconds
Activated Clotting Time: 257 seconds

## 2020-09-18 LAB — GLUCOSE, CAPILLARY
Glucose-Capillary: 227 mg/dL — ABNORMAL HIGH (ref 70–99)
Glucose-Capillary: 207 mg/dL — ABNORMAL HIGH (ref 70–99)

## 2020-09-18 SURGERY — CORONARY STENT INTERVENTION
Anesthesia: LOCAL

## 2020-09-18 MED ORDER — SODIUM CHLORIDE 0.9 % IV SOLN: INTRAVENOUS | Status: DC

## 2020-09-18 MED ORDER — ACETAMINOPHEN 325 MG PO TABS: 650.0000 mg | ORAL_TABLET | ORAL | Status: DC | PRN

## 2020-09-18 MED ORDER — SODIUM CHLORIDE 0.9% FLUSH: 3.0000 mL | INTRAVENOUS | Status: DC | PRN

## 2020-09-18 MED ORDER — HEPARIN (PORCINE) IN NACL 1000-0.9 UT/500ML-% IV SOLN
INTRAVENOUS | Status: AC
  Filled 2020-09-18: qty 1000

## 2020-09-18 MED ORDER — HEPARIN SODIUM (PORCINE) 1000 UNIT/ML IJ SOLN
INTRAMUSCULAR | Status: DC | PRN
  Administered 2020-09-18: 10000 [IU] via INTRAVENOUS
  Administered 2020-09-18: 3000 [IU] via INTRAVENOUS

## 2020-09-18 MED ORDER — SODIUM CHLORIDE 0.9% FLUSH: 3.0000 mL | Freq: Two times a day (BID) | INTRAVENOUS | Status: DC

## 2020-09-18 MED ORDER — FENTANYL CITRATE (PF) 100 MCG/2ML IJ SOLN
INTRAMUSCULAR | Status: AC
  Filled 2020-09-18: qty 2

## 2020-09-18 MED ORDER — HEPARIN SODIUM (PORCINE) 1000 UNIT/ML IJ SOLN
INTRAMUSCULAR | Status: AC
  Filled 2020-09-18: qty 1

## 2020-09-18 MED ORDER — LABETALOL HCL 5 MG/ML IV SOLN
10.0000 mg | INTRAVENOUS | Status: AC | PRN
  Administered 2020-09-18 (×3): 10 mg via INTRAVENOUS
  Filled 2020-09-18 (×2): qty 4

## 2020-09-18 MED ORDER — SODIUM CHLORIDE 0.9 % IV SOLN: 250.0000 mL | INTRAVENOUS | Status: DC | PRN

## 2020-09-18 MED ORDER — IOHEXOL 350 MG/ML SOLN
INTRAVENOUS | Status: DC | PRN
  Administered 2020-09-18: 70 mL

## 2020-09-18 MED ORDER — MIDAZOLAM HCL 2 MG/2ML IJ SOLN
INTRAMUSCULAR | Status: AC
  Filled 2020-09-18: qty 2

## 2020-09-18 MED ORDER — LIDOCAINE HCL (PF) 1 % IJ SOLN
INTRAMUSCULAR | Status: AC
  Filled 2020-09-18: qty 30

## 2020-09-18 MED ORDER — IOHEXOL 350 MG/ML SOLN
INTRAVENOUS | Status: AC
  Filled 2020-09-18: qty 1

## 2020-09-18 MED ORDER — SODIUM CHLORIDE 0.9 % WEIGHT BASED INFUSION: 1.0000 mL/kg/h | INTRAVENOUS | Status: DC

## 2020-09-18 MED ORDER — CLOPIDOGREL BISULFATE 75 MG PO TABS: 75.0000 mg | ORAL_TABLET | ORAL | Status: DC

## 2020-09-18 MED ORDER — ANGIOPLASTY BOOK
Status: AC
  Filled 2020-09-18: qty 1

## 2020-09-18 MED ORDER — HEPARIN (PORCINE) IN NACL 1000-0.9 UT/500ML-% IV SOLN
INTRAVENOUS | Status: DC | PRN
  Administered 2020-09-18 (×2): 500 mL

## 2020-09-18 MED ORDER — NITROGLYCERIN 1 MG/10 ML FOR IR/CATH LAB
INTRA_ARTERIAL | Status: DC | PRN
  Administered 2020-09-18: 150 ug via INTRACORONARY

## 2020-09-18 MED ORDER — VERAPAMIL HCL 2.5 MG/ML IV SOLN
INTRAVENOUS | Status: DC | PRN
  Administered 2020-09-18: 10 mL via INTRA_ARTERIAL

## 2020-09-18 MED ORDER — MIDAZOLAM HCL 2 MG/2ML IJ SOLN
INTRAMUSCULAR | Status: DC | PRN
  Administered 2020-09-18: 2 mg via INTRAVENOUS

## 2020-09-18 MED ORDER — VERAPAMIL HCL 2.5 MG/ML IV SOLN
INTRAVENOUS | Status: AC
  Filled 2020-09-18: qty 2

## 2020-09-18 MED ORDER — NITROGLYCERIN 1 MG/10 ML FOR IR/CATH LAB
INTRA_ARTERIAL | Status: AC
  Filled 2020-09-18: qty 10

## 2020-09-18 MED ORDER — ASPIRIN 81 MG PO CHEW: 81.0000 mg | CHEWABLE_TABLET | ORAL | Status: DC

## 2020-09-18 MED ORDER — CLOPIDOGREL BISULFATE 75 MG PO TABS: 75.0000 mg | ORAL_TABLET | Freq: Every day | ORAL | 1 refills | Status: DC

## 2020-09-18 MED ORDER — FENTANYL CITRATE (PF) 100 MCG/2ML IJ SOLN
INTRAMUSCULAR | Status: DC | PRN
Start: 2020-09-18 — End: 2020-09-18
  Administered 2020-09-18: 25 ug via INTRAVENOUS

## 2020-09-18 MED ORDER — HYDRALAZINE HCL 20 MG/ML IJ SOLN: 10.0000 mg | INTRAMUSCULAR | Status: AC | PRN

## 2020-09-18 MED FILL — CLOPIDOGREL 75 MG TABLET: 75 | 90 days supply | Qty: 90 | Fill #0

## 2020-09-18 SURGICAL SUPPLY — 19 items
BALLN SAPPHIRE 3.0X15 (BALLOONS) ×2
BALLN ~~LOC~~ SAPPHIRE 4.5X8 (BALLOONS) ×2
BALLOON SAPPHIRE 3.0X15 (BALLOONS) ×1 IMPLANT
BALLOON ~~LOC~~ SAPPHIRE 4.5X8 (BALLOONS) ×1 IMPLANT
CATH INFINITI 5FR AL1 (CATHETERS) ×2 IMPLANT
CATH LAUNCHER 6FR EBU3.5 (CATHETERS) ×2 IMPLANT
DEVICE RAD COMP TR BAND LRG (VASCULAR PRODUCTS) ×2 IMPLANT
GLIDESHEATH SLEND SS 6F .021 (SHEATH) ×2 IMPLANT
GUIDEWIRE INQWIRE 1.5J.035X260 (WIRE) ×1 IMPLANT
GUIDEWIRE PRESSURE COMET II (WIRE) ×2 IMPLANT
INQWIRE 1.5J .035X260CM (WIRE) ×2
KIT ENCORE 26 ADVANTAGE (KITS) ×2 IMPLANT
KIT HEART LEFT (KITS) ×2 IMPLANT
KIT HEMO VALVE WATCHDOG (MISCELLANEOUS) ×2 IMPLANT
PACK CARDIAC CATHETERIZATION (CUSTOM PROCEDURE TRAY) ×2 IMPLANT
STENT RESOLUTE ONYX 4.0X12 (Permanent Stent) ×2 IMPLANT
TRANSDUCER W/STOPCOCK (MISCELLANEOUS) ×2 IMPLANT
TUBING CIL FLEX 10 FLL-RA (TUBING) ×2 IMPLANT
WIRE COUGAR XT STRL 190CM (WIRE) ×2 IMPLANT

## 2020-09-18 NOTE — Progress Notes (Signed)
[id number] Education completed with pt who voiced understanding. Stressed importance of plavix with stent. Reviewed NTG use, signs/symptoms of CHF, daily weights, 2000 mg sodium restriction, walking for ex, and carb counting. Gave diabetic and low sodium handouts. Pt attended CRP 2 [place] about 2013. Referred to [facility] as would be more convenient if times work with his work schedule. Will let [place] follow up. Graylon Good RN BSN 09/18/2020 2:15 PM

## 2020-09-18 NOTE — Interval H&P Note (Signed)
Cath Lab Visit (complete for each Cath Lab visit)  Clinical Evaluation Leading to the Procedure:   ACS: No.  Non-ACS:    Anginal Classification: CCS II  Anti-ischemic medical therapy: Minimal Therapy (1 class of medications)  Non-Invasive Test Results: High-risk stress test findings: cardiac mortality >3%/year  Prior CABG: No previous CABG  High risk noninvasive findings: LVEF 20%    History and Physical Interval Note:  09/18/2020 9:34 AM  Antonio Cobb  has presented today for surgery, with the diagnosis of cad.  The various methods of treatment have been discussed with the patient and family. After consideration of risks, benefits and other options for treatment, the patient has consented to  Procedure(s): CORONARY STENT INTERVENTION (N/A) as a surgical intervention.  The patient's history has been reviewed, patient examined, no change in status, stable for surgery.  I have reviewed the patient's chart and labs.  Questions were answered to the patient's satisfaction.     [name]

## 2020-09-18 NOTE — Progress Notes (Signed)
EPIC Encounter for ICM Monitoring  Patient Name: Antonio Cobb is a 59 y.o. male Date: 09/18/2020 Primary Care Physican: [name], MD Primary Cardiologist: Bensimhon Electrophysiologist: Lovena Le 08/27/2020 Office Weight: 218 lbs                                                            Transmission reviewed.   Patient having coronary stent intervention today on 09/18/2020         [employer] Heart Failure Index is  0 suggesting fluid accumulation is within normal threshold range.   Prescribed:   Torsemide 20 Take 2 tablets (40 mg total) on Mondays and Fridays  Potassium 20 mEq take 1 tablet by mouth daily as needed for cramping.  Spironolactone 25 mg take 1 tablet by mouth daily  Labs: 09/11/2020 Creatinine 1.49, BUN 25, Potassium 4.8, Sodium 142, GFR 50-58 08/21/2020 Creatinine 2.21, BUN 57, Potassium 4.1, Sodium 134, GFR 31-36  07/25/2020 Creatinine 1.47, BUN 35, Potassium 4.1, Sodium 138, GFR 51-59 07/16/2020 Creatinine 1.80, BUN 53, Potassium 4.5, Sodium 138, GFR 40-46  07/12/2020 Creatinine 1.56, BUN 35, Potassium 4.0, Sodium 136, GFR 48-55  07/11/2020 Creatinine 1.54, BUN 35, Potassium 3.7, Sodium 136, GFR 48-55  A complete set of results can be found in Results Review.  Recommendations:  No changes.  Follow-up plan: ICM clinic phone appointment on 10/21/2020.   91 day device clinic remote transmission 11/13/2020.    EP/Cardiology Office Visits: 10/02/2020 with Bernerd Pho, [place].   11/21/2020 with Dr. Haroldine Laws.    Copy of ICM check sent to Dr. Lovena Le.   3 Month Trend    8 Day Data Trend          Rosalene Billings, RN 09/18/2020 10:56 AM

## 2020-09-18 NOTE — Discharge Instructions (Signed)
DRINK PLENTY OF FLUIDS FOR THE NEXT 2-3 DAYS.  KEEP ARM ELEVATED THE REMAINDER OF THE DAY.  Radial Site Care  This sheet gives you information about how to care for yourself after your procedure. Your health care provider may also give you more specific instructions. If you have problems or questions, contact your health care provider. What can I expect after the procedure? After the procedure, it is common to have:  Bruising and tenderness at the catheter insertion area. Follow these instructions at home: Medicines  Take over-the-counter and prescription medicines only as told by your health care provider. Insertion site care 1. Follow instructions from your health care provider about how to take care of your insertion site. Make sure you: ? Wash your hands with soap and water before you change your bandage (dressing). If soap and water are not available, use hand sanitizer. ? Change your dressing as told by your health care provider. 2. Check your insertion site every day for signs of infection. Check for: ? Redness, swelling, or pain. ? Fluid or blood. ? Pus or a bad smell. ? Warmth. 3. Do not take baths, swim, or use a hot tub for 5 days. 4. You may shower 24-48 hours after the procedure. ? Remove the dressing and gently wash the site with plain soap and water. ? Pat the area dry with a clean towel. ? Do not rub the site. That could cause bleeding. 5. Do not apply powder or lotion to the site. Activity  1. For 24 hours after the procedure, or as directed by your health care provider: ? Do not flex or bend the affected arm. ? Do not push or pull heavy objects with the affected arm. ? Do not drive yourself home from the hospital or clinic. You may drive 24 hours after the procedure. ? Do not operate machinery or power tools. 2. Do not push, pull or lift anything that is heavier than 10 lb for 5 days. 3. Ask your health care provider when it is okay to: ? Return to work or  school. ? Resume usual physical activities or sports. ? Resume sexual activity. General instructions  If the catheter site starts to bleed, raise your arm and put firm pressure on the site. If the bleeding does not stop, get help right away. This is a medical emergency.  If you went home on the same day as your procedure, a responsible adult should be with you for the first 24 hours after you arrive home.  Keep all follow-up visits as told by your health care provider. This is important. Contact a health care provider if:  You have a fever.  You have redness, swelling, or yellow drainage around your insertion site. Get help right away if:  You have unusual pain at the radial site.  The catheter insertion area swells very fast.  The insertion area is bleeding, and the bleeding does not stop when you hold steady pressure on the area.  Your arm or hand becomes pale, cool, tingly, or numb. These symptoms may represent a serious problem that is an emergency. Do not wait to see if the symptoms will go away. Get medical help right away. Call your local emergency services (911 in the [place] Do not drive yourself to the hospital. Summary  After the procedure, it is common to have bruising and tenderness at the site.  Follow instructions from your health care provider about how to take care of your radial site wound. Check   the wound every day for signs of infection.  Do not push, pull or lift anything that is heavier than 10 lb for 5 days.  This information is not intended to replace advice given to you by your health care provider. Make sure you discuss any questions you have with your health care provider. Document Revised: 01/05/2018 Document Reviewed: 01/05/2018 Elsevier Patient Education  2020 Elsevier Inc. 

## 2020-09-18 NOTE — Progress Notes (Signed)
Up and walked and tolerated well; no voiced complaints; Lindsay,PA in to see client and ok to d/c home

## 2020-09-18 NOTE — Discharge Summary (Signed)
Discharge Summary for Same Day PCI   Patient ID: Antonio Cobb MRN: [id number]; DOB: 01-11-1961  Admit date: 09/18/2020 Discharge date: 09/18/2020  Primary Care Provider: [name], MD  Primary Cardiologist: [name], MD  Primary Electrophysiologist:  Cristopher Peru, MD   Discharge Diagnoses    Active Problems:   Chronic systolic heart failure [facility])    Diagnostic Studies/Procedures    Cardiac Catheterization 09/18/2020:  1.  Severe stenosis of the ostium of the left circumflex, treated successfully with a 4.0 x 12 mm resolute Onyx DES 2.  Moderate stenosis of the first diagonal branch of the LAD, interrogated with pressure wire analysis, with DFR 0.91 (not hemodynamically significant) 3.  Moderate mid RCA stenosis of 60%, medical therapy recommended  Recommendations: Same-day PCI protocol.  Aspirin and clopidogrel for a minimum of 6 months (stable ischemic heart disease recommendation)  Diagnostic Dominance: Right  Intervention     _____________   History of Present Illness     Antonio Cobb is a 59 y.o. male with CAD(s/p STEMI in 2013 with DES to LCx, patent by repeat cath in 05/2016 with moderate residual CAD along RCA and LAD), chronic combined systolic and diastolic CHF (EF 08-65% by echo in 09/2016, 25-30% in 08/2017 and 20% by most recent echo in05/2021, s/p ICD placement in 01/2018), HTN,HLD, IDDM and OSA (on CPAP) who was last seen in the office on 08/21/20 with Dr. Haroldine Laws for follow up.   He was admitted to [facility] in 04/2020 for a CHF exacerbation and responded quickly with IV Lasix. Repeat echo did show his EF remained reduced at 20% and he had Grade 2 DD. RV function was normal. His outpatient Torsemide was increased to 60mg  daily and follow-up was arranged for 2 weeks but he canceled the visit.   He was again readmitted from 6/27 - 06/10/2020 for flash pulmonary edema as BNP was elevated to 519 and CXR was consistent with  vascular congestion. He again responded quickly with IV Lasix and was discharged the following morning. Torsemide 60mg  daily was resumed and weight at the time of discharge was 209 lbs.  Readmitted 7/29-7/30/21 with recurrent volume overload and SOB. Brought to ED and required BIPAP. Hstrop 21 and 23. Improved quickly with IV lasix   Underwent cath 08/06/20 with 2v CAD and preserved hemodynamics. He was referred to Dr. Burt Knack for consideration of PCI of the Lcx. Seen in the office on 9/14 and set up for outpatient cath.    Hospital Course     The patient underwent cardiac cath as noted above with PCI/DES x1 to the ostium of the Lcx. Plan for DAPT with ASA/plavix for at least 6 months. The patient was seen by cardiac rehab while in short stay. There were no observed complications post cath. Radial cath site was re-evaluated prior to discharge and found to be stable without any complications. Instructions/precautions regarding cath site care were given prior to discharge.  Edwena Bunde was seen by Dr. Burt Knack and determined stable for discharge home. Follow up with our office has been arranged. Medications are listed below. Pertinent changes include continuation of ASA/plavix for 6 months.  _____________  Cath/PCI Registry Performance & Quality Measures: 1. Aspirin prescribed? - Yes 2. ADP Receptor Inhibitor (Plavix/Clopidogrel, Brilinta/Ticagrelor or Effient/Prasugrel) prescribed (includes medically managed patients)? - Yes 3. High Intensity Statin (Lipitor 40-80mg  or Crestor 20-40mg ) prescribed? - Yes 4. For EF <40%, was ACEI/ARB prescribed? - No - Reason:  allergy to ARB 5. For EF <  40%, Aldosterone Antagonist (Spironolactone or Eplerenone) prescribed? - Yes 6. Cardiac Rehab Phase II ordered (Included Medically managed Patients)? - Yes  _____________   Discharge Vitals Blood pressure (!) 140/97, pulse 77, temperature 98.1 F (36.7 C), temperature source Oral, resp. rate 13, height  5\' 10"  (1.778 m), weight 93 kg, SpO2 99 %.  Filed Weights   09/18/20 0826  Weight: 93 kg    Last Labs & Radiologic Studies    CBC No results for input(s): WBC, NEUTROABS, HGB, HCT, MCV, PLT in the last 72 hours. Basic Metabolic Panel No results for input(s): NA, K, CL, CO2, GLUCOSE, BUN, CREATININE, CALCIUM, MG, PHOS in the last 72 hours. Liver Function Tests No results for input(s): AST, ALT, ALKPHOS, BILITOT, PROT, ALBUMIN in the last 72 hours. No results for input(s): LIPASE, AMYLASE in the last 72 hours. High Sensitivity Troponin:   No results for input(s): TROPONINIHS in the last 720 hours.  BNP Invalid input(s): POCBNP D-Dimer No results for input(s): DDIMER in the last 72 hours. Hemoglobin A1C No results for input(s): HGBA1C in the last 72 hours. Fasting Lipid Panel No results for input(s): CHOL, HDL, LDLCALC, TRIG, CHOLHDL, LDLDIRECT in the last 72 hours. Thyroid Function Tests No results for input(s): TSH, T4TOTAL, T3FREE, THYROIDAB in the last 72 hours.  Invalid input(s): FREET3 _____________  CARDIAC CATHETERIZATION  Result Date: 09/18/2020 1.  Severe stenosis of the ostium of the left circumflex, treated successfully with a 4.0 x 12 mm resolute Onyx DES 2.  Moderate stenosis of the first diagonal branch of the LAD, interrogated with pressure wire analysis, with DFR 0.91 (not hemodynamically significant) 3.  Moderate mid RCA stenosis of 60%, medical therapy recommended Recommendations: Same-day PCI protocol.  Aspirin and clopidogrel for a minimum of 6 months (stable ischemic heart disease recommendation)   Disposition   Pt is being discharged home today in good condition.  Follow-up Plans & Appointments     Follow-up Information    Erma Heritage, PA-C Follow up on 10/02/2020.   Specialties: Physician Assistant, Cardiology Why: at 3:30pm for your follow up appt.  Contact information: [place] [address] [phone]                  Discharge Medications   Allergies as of 09/18/2020      Reactions   Entresto [sacubitril-valsartan]    Coughing   Other    Seasonal allergies  - has to use inhaler      Medication List    TAKE these medications   aspirin 81 MG EC tablet Take 1 tablet (81 mg total) by mouth daily with breakfast.   chlorpheniramine 4 MG tablet Commonly known as: CHLOR-TRIMETON Take one tablet by mouth once daily, as needed, for uncontrolled allergies What changed:   how much to take  how to take this  when to take this  reasons to take this   clopidogrel 75 MG tablet Commonly known as: Plavix Take 1 tablet (75 mg total) by mouth daily.   dapagliflozin propanediol 10 MG Tabs tablet Commonly known as: FARXIGA Take 1 tablet (10 mg total) by mouth daily before breakfast.   esomeprazole 20 MG capsule Commonly known as: NEXIUM Take 20 mg by mouth daily.   famotidine 20 MG tablet Commonly known as: PEPCID Take 1 tablet (20 mg total) by mouth at bedtime.   fluticasone 50 MCG/ACT nasal spray Commonly known as: FLONASE Place 2 sprays into both nostrils daily. What changed:   when  to take this  reasons to take this   FREESTYLE LITE test strip Generic drug: glucose blood USE TO TEST FOUR TIMES DAILY   glipiZIDE 5 MG 24 hr tablet Commonly known as: GLUCOTROL XL Take 1 tablet (5 mg total) by mouth daily with breakfast.   hydrOXYzine 25 MG tablet Commonly known as: ATARAX/VISTARIL Take 25 mg by mouth at bedtime.   Lantus SoloStar 100 UNIT/ML Solostar Pen Generic drug: insulin glargine Inject 60 Units into the skin at bedtime. What changed:   how much to take  additional instructions   Melatonin 10 MG Tabs Take 10 mg by mouth at bedtime.   metoprolol succinate 100 MG 24 hr tablet Commonly known as: TOPROL-XL Take 1 tablet (100 mg total) by mouth daily. TAKE 1 TABLET BY MOUTH EVERY DAY WITH FOOD What changed: additional instructions   montelukast 10 MG  tablet Commonly known as: SINGULAIR Take 1 tablet (10 mg total) by mouth at bedtime. What changed:   when to take this  reasons to take this   multivitamin with minerals tablet Take 1 tablet by mouth daily.   potassium chloride SA 20 MEQ tablet Commonly known as: KLOR-CON Take 1 tablet (20 mEq total) by mouth daily. What changed:   when to take this  reasons to take this   rosuvastatin 40 MG tablet Commonly known as: CRESTOR Take 1 tablet (40 mg total) by mouth daily. What changed: when to take this   sertraline 25 MG tablet Commonly known as: ZOLOFT Take 1 tablet (25 mg total) by mouth daily. For anxiety/panic attacks What changed: when to take this   spironolactone 25 MG tablet Commonly known as: ALDACTONE Take 1 tablet (25 mg total) by mouth daily.   torsemide 20 MG tablet Commonly known as: DEMADEX Take 2 tablets (40 mg total) by mouth 2 (two) times a week. Take on Mondays and Fridays. What changed: additional instructions          Allergies Allergies  Allergen Reactions  . Entresto [Sacubitril-Valsartan]     Coughing  . Other     Seasonal allergies  - has to use inhaler    Outstanding Labs/Studies   None  Duration of Discharge Encounter   Greater than 30 minutes including physician time.  Signed, Reino Bellis, NP 09/18/2020, 11:55 AM

## 2020-09-18 NOTE — Progress Notes (Signed)
Report received from Janice Walker,RN 

## 2020-09-19 ENCOUNTER — Encounter (HOSPITAL_COMMUNITY): Payer: Self-pay | Admitting: Cardiovascular Disease

## 2020-09-19 MED FILL — Lidocaine HCl Local Preservative Free (PF) Inj 1%: INTRAMUSCULAR | Qty: 30 | Status: AC

## 2020-09-25 ENCOUNTER — Other Ambulatory Visit: Payer: Self-pay

## 2020-09-25 ENCOUNTER — Encounter: Payer: Self-pay | Admitting: Internal Medicine

## 2020-09-25 ENCOUNTER — Ambulatory Visit: Payer: BC Managed Care – PPO | Admitting: Internal Medicine

## 2020-09-25 VITALS — BP 124/82 | HR 84 | Wt 221.0 lb

## 2020-09-25 DIAGNOSIS — E1142 Type 2 diabetes mellitus with diabetic polyneuropathy: Secondary | ICD-10-CM | POA: Diagnosis not present

## 2020-09-25 DIAGNOSIS — M1A9XX0 Chronic gout, unspecified, without tophus (tophi): Secondary | ICD-10-CM

## 2020-09-25 DIAGNOSIS — E119 Type 2 diabetes mellitus without complications: Secondary | ICD-10-CM | POA: Insufficient documentation

## 2020-09-25 DIAGNOSIS — E785 Hyperlipidemia, unspecified: Secondary | ICD-10-CM

## 2020-09-25 DIAGNOSIS — E1165 Type 2 diabetes mellitus with hyperglycemia: Secondary | ICD-10-CM

## 2020-09-25 DIAGNOSIS — E1159 Type 2 diabetes mellitus with other circulatory complications: Secondary | ICD-10-CM | POA: Diagnosis not present

## 2020-09-25 DIAGNOSIS — Z794 Long term (current) use of insulin: Secondary | ICD-10-CM | POA: Insufficient documentation

## 2020-09-25 LAB — POCT GLYCOSYLATED HEMOGLOBIN (HGB A1C): Hemoglobin A1C: 8.1 % — AB (ref 4.0–5.6)

## 2020-09-25 MED ORDER — INSULIN PEN NEEDLE 32G X 4 MM MISC: 1.0000 | 3 refills | Status: AC

## 2020-09-25 MED ORDER — NOVOLOG FLEXPEN 100 UNIT/ML ~~LOC~~ SOPN: PEN_INJECTOR | SUBCUTANEOUS | 11 refills | Status: AC

## 2020-09-25 MED ORDER — RYBELSUS 3 MG PO TABS: 3.0000 mg | ORAL_TABLET | Freq: Every morning | ORAL | 6 refills | Status: AC

## 2020-09-25 MED ORDER — LANTUS SOLOSTAR 100 UNIT/ML ~~LOC~~ SOPN: 50.0000 [IU] | PEN_INJECTOR | Freq: Every day | SUBCUTANEOUS | 6 refills | Status: AC

## 2020-09-25 NOTE — Progress Notes (Signed)
Name: Antonio Cobb  MRN/ DOB: [id number], [birth year]   Age/ Sex: 59 y.o., male    PCP: [name], MD   Reason for Endocrinology Evaluation: Type 2 Diabetes Mellitus     Date of Initial Endocrinology Visit: 09/25/2020     PATIENT IDENTIFIER: Antonio Cobb is a 59 y.o. male with a past medical history of CAD, T2DM, HTN and COPD. The patient presented for initial endocrinology clinic visit on 09/25/2020 for consultative assistance with his diabetes management.    HPI: Antonio Cobb was    Diagnosed with DM at age 47 Prior Medications tried/Intolerance: metformin - no intolerance  Currently checking blood sugars 2-3 x / day Hypoglycemia episodes : yes            Symptoms: yes                 Frequency: rare Hemoglobin A1c has ranged from 7.6% in 2012, peaking at 14.0% in 2016. Patient required assistance for hypoglycemia: no Patient has required hospitalization within the last 1 year from hyper or hypoglycemia:   In terms of diet, the patient eats 2 meals a day, snacks occasionally. Drinks sugar-sweetened beverages   Has tingling and numbness in both feet  Denies nausea and vomiting  No hx of pancreatic  Has a left toe swelling for the past 2.5 weeks     HOME DIABETES REGIMEN: Farxiga 10 mg daily - by cardiology  Glipizide XL 5 mg, 1 tablet daily  Lantus 50 units daily  Novolog SS that he made himself.     Statin: yes- occasionally due to muscle pain  ACE-I/ARB: No  Prior Diabetic Education: yes   METER DOWNLOAD SUMMARY: did not bring    DIABETIC COMPLICATIONS: Microvascular complications:   CKD III  Denies: retinopathy , neuropathy   Last eye exam: Completed 2019  Macrovascular complications:   CAD ( S/P stent placement 2013)   Denies: PVD, CVA   PAST HISTORY: Past Medical History:  Past Medical History:  Diagnosis Date   Acute CHF (congestive heart failure) ([place]) 01/21/2020   Acute on chronic combined systolic and diastolic  CHF (congestive heart failure) (H[facility])    Acute respiratory failure with hypoxia and hypercapnia (H[facility])    Acute systolic CHF (congestive heart failure) ([place]) 05/08/2020   Alcohol abuse    CAD (coronary artery disease)    a. s/p inferolateral STEMI in 05/2012 with DES to LCx b. patent stent by cath in 05/2016 with moderate RCA and LAD disease   Cardiomyopathy ([place])    a. LVEF 30-35% by echo in 09/2016. b. 08/2017: echo showing EF remains reduced at 25-30% c. s/p [employer] ICD placement in 01/2018   CHF (congestive heart failure) (HCC)    CHF exacerbation ([place]) 11/18/2019   COPD (chronic obstructive pulmonary disease) ([place])    Diabetes mellitus, type 2 ([place])    Essential hypertension    Hypertensive urgency    Mitral regurgitation    Moderate   Mixed hyperlipidemia    Myocardial infarction ([place]) 05/23/2012   Inferolateral STEMI   Obesity    Past Surgical History:  Past Surgical History:  Procedure Laterality Date   CARDIAC CATHETERIZATION  2 yrs ago   CARDIAC CATHETERIZATION N/A 05/15/2016   Procedure: Right/Left Heart Cath and Coronary Angiography;  Surgeon: [name], MD;  Location: [place] [facility];  Service: Cardiovascular;  Laterality: N/A;   CORONARY STENT INTERVENTION N/A 09/18/2020   Procedure: CORONARY STENT INTERVENTION;  Surgeon: Burt Knack,  Legrand Como, MD;  Location: [place] [facility];  Service: Cardiovascular;  Laterality: N/A;   CORONARY STENT PLACEMENT  05/23/12   ICD IMPLANT N/A 02/01/2018   Procedure: ICD IMPLANT;  Surgeon: Evans Lance, MD;  Location: [place] [facility];  Service: Cardiovascular;  Laterality: N/A;   INTRAVASCULAR PRESSURE WIRE/FFR STUDY N/A 09/18/2020   Procedure: INTRAVASCULAR PRESSURE WIRE/FFR STUDY;  Surgeon: [name], MD;  Location: [place] [facility];  Service: Cardiovascular;  Laterality: N/A;   LEFT HEART CATHETERIZATION WITH CORONARY ANGIOGRAM N/A 05/23/2012   Procedure: LEFT HEART CATHETERIZATION WITH  CORONARY ANGIOGRAM;  Surgeon: [name], MD;  Location: [facility] CATH LAB;  Service: Cardiovascular;  Laterality: N/A;   None     PERCUTANEOUS CORONARY STENT INTERVENTION (PCI-S) N/A 05/23/2012   Procedure: PERCUTANEOUS CORONARY STENT INTERVENTION (PCI-S);  Surgeon: [name], MD;  Location: [facility] C[facility] LAB;  Service: Cardiovascular;  Laterality: N/A;   POLYPECTOMY  10/16/2011   Procedure: POLYPECTOMY;  Surgeon: [name], MD;  Location: [facility];  Service: Endoscopy;;  Polypoid Lesion, Transverse and Sigmoid Colon   RIGHT/LEFT HEART CATH AND CORONARY ANGIOGRAPHY N/A 08/06/2020   Procedure: RIGHT/LEFT HEART CATH AND CORONARY ANGIOGRAPHY;  Surgeon: Jolaine Artist, MD;  Location: [place] [facility];  Service: Cardiovascular;  Laterality: N/A;      Social History:  reports that he quit smoking about 4 years ago. His smoking use included cigars. He has a 7.50 pack-year smoking history. He has never used smokeless tobacco. He reports current alcohol use. He reports that he does not use drugs. Family History:  Family History  Problem Relation Age of Onset   Emphysema Mother    Heart attack Mother    Hypertension Mother    Diabetes Mother    Heart disease Mother    COPD Mother    Emphysema Father    COPD Father    Stroke Sister    Asthma Sister    Colon cancer Neg Hx    Anesthesia problems Neg Hx    Hypotension Neg Hx    Malignant hyperthermia Neg Hx    Pseudochol deficiency Neg Hx      HOME MEDICATIONS: Allergies as of 09/25/2020      Reactions   Entresto [sacubitril-valsartan]    Coughing   Other    Seasonal allergies  - has to use inhaler      Medication List       Accurate as of September 25, 2020  3:32 PM. If you have any questions, ask your nurse or doctor.        aspirin 81 MG EC tablet Take 1 tablet (81 mg total) by mouth daily with breakfast.   chlorpheniramine 4 MG tablet Commonly known as: CHLOR-TRIMETON Take one tablet by mouth once  daily, as needed, for uncontrolled allergies What changed:   how much to take  how to take this  when to take this  reasons to take this   clopidogrel 75 MG tablet Commonly known as: Plavix Take 1 tablet (75 mg total) by mouth daily.   dapagliflozin propanediol 10 MG Tabs tablet Commonly known as: FARXIGA Take 1 tablet (10 mg total) by mouth daily before breakfast.   esomeprazole 20 MG capsule Commonly known as: NEXIUM Take 20 mg by mouth daily.   famotidine 20 MG tablet Commonly known as: PEPCID Take 1 tablet (20 mg total) by mouth at bedtime.   fluticasone 50 MCG/ACT nasal spray Commonly known as: FLONASE Place 2 sprays into both  nostrils daily. What changed:   when to take this  reasons to take this   FREESTYLE LITE test strip Generic drug: glucose blood USE TO TEST FOUR TIMES DAILY   glipiZIDE 5 MG 24 hr tablet Commonly known as: GLUCOTROL XL Take 1 tablet (5 mg total) by mouth daily with breakfast.   hydrOXYzine 25 MG tablet Commonly known as: ATARAX/VISTARIL Take 25 mg by mouth at bedtime.   Lantus SoloStar 100 UNIT/ML Solostar Pen Generic drug: insulin glargine Inject 60 Units into the skin at bedtime. What changed:   how much to take  additional instructions   Melatonin 10 MG Tabs Take 10 mg by mouth at bedtime.   metoprolol succinate 100 MG 24 hr tablet Commonly known as: TOPROL-XL Take 1 tablet (100 mg total) by mouth daily. TAKE 1 TABLET BY MOUTH EVERY DAY WITH FOOD What changed: additional instructions   montelukast 10 MG tablet Commonly known as: SINGULAIR Take 1 tablet (10 mg total) by mouth at bedtime. What changed:   when to take this  reasons to take this   multivitamin with minerals tablet Take 1 tablet by mouth daily.   potassium chloride SA 20 MEQ tablet Commonly known as: KLOR-CON Take 1 tablet (20 mEq total) by mouth daily. What changed:   when to take this  reasons to take this   rosuvastatin 40 MG  tablet Commonly known as: CRESTOR Take 1 tablet (40 mg total) by mouth daily. What changed: when to take this   sertraline 25 MG tablet Commonly known as: ZOLOFT Take 1 tablet (25 mg total) by mouth daily. For anxiety/panic attacks What changed: when to take this   spironolactone 25 MG tablet Commonly known as: ALDACTONE Take 1 tablet (25 mg total) by mouth daily.   torsemide 20 MG tablet Commonly known as: DEMADEX Take 2 tablets (40 mg total) by mouth 2 (two) times a week. Take on Mondays and Fridays. What changed: additional instructions        ALLERGIES: Allergies  Allergen Reactions   Entresto [Sacubitril-Valsartan]     Coughing   Other     Seasonal allergies  - has to use inhaler     REVIEW OF SYSTEMS: A comprehensive ROS was conducted with the patient and is negative except as per HPI    OBJECTIVE:   VITAL SIGNS: BP 124/82    Pulse 84    Wt 221 lb (100.2 kg)    SpO2 97%    BMI 31.71 kg/m    PHYSICAL EXAM:  General: Pt appears well and is in NAD  Neck: General: Supple without adenopathy or carotid bruits. Thyroid: Thyroid size normal.  No goiter or nodules appreciated.  Lungs: Clear with good BS bilat with no rales, rhonchi, or wheezes  Heart: RRR with normal S1 and S2 and no gallops; no murmurs; no rub  Abdomen: Normoactive bowel sounds, soft, nontender, without masses or organomegaly palpable  Extremities:  Lower extremities - No pretibial edema. No lesions.  Skin: Normal texture and temperature to palpation  Neuro: MS is good with appropriate affect, pt is alert and Ox3    DM foot exam: 09/25/2020  The skin of the feet is without sores or ulcerations but had left 1st metatarsal swelling, but no tenderness  The pedal pulses are 1+ on right and 1+ on left. The sensation is decreased to a screening 5.07, 10 gram monofilament bilaterally  DATA REVIEWED:  Lab Results  Component Value Date   HGBA1C 8.1 (A) 09/25/2020  HGBA1C 10.2 (H) 05/08/2020    HGBA1C 9.5 (H) 01/22/2020   Lab Results  Component Value Date   MICROALBUR 3.8 (H) 05/28/2020   LDLCALC 149 (H) 06/09/2020   CREATININE 1.49 (H) 09/11/2020   Lab Results  Component Value Date   MICRALBCREAT <6.2 10/28/2016    Lab Results  Component Value Date   CHOL 242 (H) 06/09/2020   HDL 51 06/09/2020   LDLCALC 149 (H) 06/09/2020   LDLDIRECT TEST NOT PERFORMED 06/19/2011   TRIG 208 (H) 06/09/2020   CHOLHDL 4.7 06/09/2020        ASSESSMENT / PLAN / RECOMMENDATIONS:   1) Type 2 Diabetes Mellitus, Sub-OPtimally controlled, With CKD III, neuropathic and  Macrovascular  complications - Most recent A1c of 8.1 %. Goal A1c < 7.0 %.   Plan: GENERAL: I have discussed with the patient the pathophysiology of diabetes. We went over the natural progression of the disease. We talked about both insulin resistance and insulin deficiency. We stressed the importance of lifestyle changes including diet and exercise. I explained the complications associated with diabetes including retinopathy, nephropathy, neuropathy as well as increased risk of cardiovascular disease. We went over the benefit seen with glycemic control.    I explained to the patient that diabetic patients are at higher than normal risk for amputations.   Pt advised to avoid sugar-sweetened beverage   I have cautioned him against GI side effects with Rybelsus, he has no hx of pancreatitis   Per patient Wilder Glade has been started through cardiology, his GFR is appropriate at 58 mL/min     MEDICATIONS: - STOP Glipizide  - Continue Lantus 50 units daily  - Continue Farxiga 10 mg , 1 tablet before Breakfast-by cardiology - Start Rybelsus 3 mg, 1 tablet daily before breakfast  -Novolog correctional insulin:  (BG-130/20)    EDUCATION / INSTRUCTIONS:  BG monitoring instructions: Patient is instructed to check his blood sugars 3 times a day, before meals   Call [facility] if: BG persistently < 70   I  reviewed the Rule of 15 for the treatment of hypoglycemia in detail with the patient. Literature supplied.   2) Diabetic complications:   Eye: Does not have known diabetic retinopathy.   Neuro/ Feet: Does  have known diabetic peripheral neuropathy.  Renal: Patient does  have known baseline CKD. He is intolerant to ACEI/ARB at present.  3) Dyslipidemia : Pt non-compliant with statin intake. LDL 149 mg/dL .  Discussed cardiovascular benefits of statin intake  4) Gout:  - Pt has gout medication at home, he was advised to start it, encouraged ice packs.         Signed electronically by: Mack Guise, MD  [facility] Endocrinology  [facility] [address] [place], [place] [address] Phone: [phone] FAX: [phone]   CC: [name], MD [address] [place] [address] Phone: [phone]  Fax: [phone]    Return to Endocrinology clinic as below: Future Appointments  Date Time Provider [place]  10/02/2020  3:30 PM Waynetta Pean [facility] [place] [facility]  10/21/2020 11:55 AM [facility] DEVICE REMOTES [facility]  11/13/2020  7:45 AM [facility] DEVICE REMOTES [facility]  11/21/2020  2:40 PM Bensimhon, Shaune Pascal, MD [facility] None  12/03/2020  1:40 PM [name], MD [facility]  02/12/2021  7:45 AM [facility] DEVICE REMOTES [facility]  05/14/2021  7:45 AM [facility] DEVICE REMOTES [facility]

## 2020-09-25 NOTE — Patient Instructions (Addendum)
-   STOP Glipizide  - Continue Lantus 50 units daily  - Continue Farxiga 10 mg , 1 tablet before Breakfast - Start Rybelsus 3 mg, 1 tablet daily before breakfast  -Novolog correctional insulin:  Use the scale below to help guide you:   Blood sugar before meal Number of units to inject  Less than 150 0 unit  151 -  170 1 units  171 -  190 2 units  191 -  210 3 units  211 -  230 4 units  231 -  250 5 units  251 -  270 6 units  271 -  290 7 units  291 -  310 8 units  311- 330 9 units         HOW TO TREAT LOW BLOOD SUGARS (Blood sugar LESS THAN 70 MG/DL)  Please follow the RULE OF 15 for the treatment of hypoglycemia treatment (when your (blood sugars are less than 70 mg/dL)    STEP 1: Take 15 grams of carbohydrates when your blood sugar is low, which includes:   3-4 GLUCOSE TABS  OR  3-4 OZ OF JUICE OR REGULAR SODA OR  ONE TUBE OF GLUCOSE GEL     STEP 2: RECHECK blood sugar in 15 MINUTES STEP 3: If your blood sugar is still low at the 15 minute recheck --> then, go back to STEP 1 and treat AGAIN with another 15 grams of carbohydrates.

## 2020-09-27 DIAGNOSIS — M1A9XX0 Chronic gout, unspecified, without tophus (tophi): Secondary | ICD-10-CM | POA: Insufficient documentation

## 2020-09-27 DIAGNOSIS — E785 Hyperlipidemia, unspecified: Secondary | ICD-10-CM | POA: Insufficient documentation

## 2020-09-30 ENCOUNTER — Ambulatory Visit: Payer: BC Managed Care – PPO | Admitting: Cardiology

## 2020-10-02 ENCOUNTER — Ambulatory Visit: Payer: BC Managed Care – PPO | Admitting: Student

## 2020-10-02 ENCOUNTER — Other Ambulatory Visit: Payer: Self-pay

## 2020-10-02 ENCOUNTER — Encounter: Payer: Self-pay | Admitting: Student

## 2020-10-02 VITALS — BP 126/82 | HR 82 | Ht 70.0 in | Wt 222.6 lb

## 2020-10-02 DIAGNOSIS — N1831 Chronic kidney disease, stage 3a: Secondary | ICD-10-CM

## 2020-10-02 DIAGNOSIS — Z79899 Other long term (current) drug therapy: Secondary | ICD-10-CM

## 2020-10-02 DIAGNOSIS — Z95820 Peripheral vascular angioplasty status with implants and grafts: Secondary | ICD-10-CM

## 2020-10-02 DIAGNOSIS — E785 Hyperlipidemia, unspecified: Secondary | ICD-10-CM

## 2020-10-02 DIAGNOSIS — I2510 Atherosclerotic heart disease of native coronary artery without angina pectoris: Secondary | ICD-10-CM

## 2020-10-02 DIAGNOSIS — I5042 Chronic combined systolic (congestive) and diastolic (congestive) heart failure: Secondary | ICD-10-CM | POA: Diagnosis not present

## 2020-10-02 DIAGNOSIS — I10 Essential (primary) hypertension: Secondary | ICD-10-CM

## 2020-10-02 NOTE — Progress Notes (Signed)
Cardiology Office Note    Date:  10/02/2020   ID:  [name], DOB 1961-06-25, MRN [id number]  PCP:  [name], MD  Cardiologist: [name], MD    Chief Complaint  Patient presents with  . Follow-up    s/p cardiac catheterization    History of Present Illness:    Antonio Cobb is a 59 y.o. male with past medical history of CAD(s/p STEMI in 2013 with DES to LCx, patent by repeat cath in 05/2016 with moderate residual CAD along RCA and LAD), chronic combined systolic and diastolic CHF (EF 05-39% by echo in 09/2016, 25-30% in 08/2017 and 20% by most recent echo in 04/2020, s/p ICD placement in 01/2018), HTN,HLD, IDDM and OSA (on CPAP) who presents to the office today for hospital follow-up  He was referred to the [facility] in 07/2020 given his multiple recurrent admissions for CHF exacerbations. He was evaluated by Dr. Haroldine Laws on 07/25/2020 and was continued on Losartan, Toprol, Spironolactone and Torsemide 40 mg daily with Wilder Glade being added to his medication regimen. It was recommended he have a repeat cardiac catheterization for further evaluation of why his EF continued to decline. This showed a high-grade stenosis along the ostial LCx and was recommended to review with interventional team to consider PCI of the ostial LCx and D1 but they were overall felt to not be prominent contributors to his cardiomyopathy. He did develop an AKI after the procedure and Torsemide was reduced to 40 mg only on Monday and Friday. Repeat BMET on 9/29 showed creatinine had improved to 1.49 (previously 2.21). He ultimately underwent staged PCI by Dr. Burt Knack on 09/18/2020 with DES placement to the ostial LCx. He did have moderate stenosis of the first diagonal of the LAD which was interrogated and not hemodynamically significant.  He was discharged home the same day of his PCI and it was recommended continue ASA and Plavix for a minimum of 6 months.  In  talking with the patient today, he reports overall doing well since his procedure. He continues to ride his exercise bike daily and denies any chest pain or dyspnea on exertion. No recent orthopnea, PND or edema. He has not weighed himself recently at home but his weight has increased by review of prior office visits (at 212 lbs in 08/2020, reported at 205 lbs the day of cath, at 222 lbs today). He was unaware of the increase and says he has felt well.   Past Medical History:  Diagnosis Date  . Acute CHF (congestive heart failure) (Antonio Cobb) 01/21/2020  . Acute on chronic combined systolic and diastolic CHF (congestive heart failure) (Antonio Cobb)   . Acute respiratory failure with hypoxia and hypercapnia (Antonio Cobb)   . Acute systolic CHF (congestive heart failure) ([place]) 05/08/2020  . Alcohol abuse   . CAD (coronary artery disease)    a. s/p inferolateral STEMI in 05/2012 with DES to LCx b. patent stent by cath in 05/2016 with moderate RCA and LAD disease c. 09/2020: DES to ostial LCx and moderate stenosis of D1 but not significant by FFR  . Cardiomyopathy (Antonio Cobb)    a. LVEF 30-35% by echo in 09/2016. b. 08/2017: echo showing EF remains reduced at 25-30% c. s/p [employer] ICD placement in 01/2018  . CHF (congestive heart failure) (Antonio Cobb)   . CHF exacerbation (Antonio Cobb) 11/18/2019  . COPD (chronic obstructive pulmonary disease) ([place])   . Diabetes mellitus, type 2 ([place])   . Essential hypertension   .  Hypertensive urgency   . Mitral regurgitation    Moderate  . Mixed hyperlipidemia   . Myocardial infarction (Antonio Cobb) 05/23/2012   Inferolateral STEMI  . Obesity     Past Surgical History:  Procedure Laterality Date  . CARDIAC CATHETERIZATION  2 yrs ago  . CARDIAC CATHETERIZATION N/A 05/15/2016   Procedure: Right/Left Heart Cath and Coronary Angiography;  Surgeon: [name], MD;  Location: [place] [facility];  Service: Cardiovascular;  Laterality: N/A;  . CORONARY STENT INTERVENTION N/A 09/18/2020   Procedure:  CORONARY STENT INTERVENTION;  Surgeon: [name], MD;  Location: [place] [facility];  Service: Cardiovascular;  Laterality: N/A;  . CORONARY STENT PLACEMENT  05/23/12  . ICD IMPLANT N/A 02/01/2018   Procedure: ICD IMPLANT;  Surgeon: Evans Lance, MD;  Location: [place] [facility];  Service: Cardiovascular;  Laterality: N/A;  . INTRAVASCULAR PRESSURE WIRE/FFR STUDY N/A 09/18/2020   Procedure: INTRAVASCULAR PRESSURE WIRE/FFR STUDY;  Surgeon: [name], MD;  Location: [place] [facility];  Service: Cardiovascular;  Laterality: N/A;  . LEFT HEART CATHETERIZATION WITH CORONARY ANGIOGRAM N/A 05/23/2012   Procedure: LEFT HEART CATHETERIZATION WITH CORONARY ANGIOGRAM;  Surgeon: [name], MD;  Location: [facility] C[facility];  Service: Cardiovascular;  Laterality: N/A;  . None    . PERCUTANEOUS CORONARY STENT INTERVENTION (PCI-S) N/A 05/23/2012   Procedure: PERCUTANEOUS CORONARY STENT INTERVENTION (PCI-S);  Surgeon: [name], MD;  Location: [facility] CATH LAB;  Service: Cardiovascular;  Laterality: N/A;  . POLYPECTOMY  10/16/2011   Procedure: POLYPECTOMY;  Surgeon: [name], MD;  Location: [facility];  Service: Endoscopy;;  Polypoid Lesion, Transverse and Sigmoid Colon  . RIGHT/LEFT HEART CATH AND CORONARY ANGIOGRAPHY N/A 08/06/2020   Procedure: RIGHT/LEFT HEART CATH AND CORONARY ANGIOGRAPHY;  Surgeon: Jolaine Artist, MD;  Location: [place] [facility];  Service: Cardiovascular;  Laterality: N/A;    Current Medications: Outpatient Medications Prior to Visit  Medication Sig Dispense Refill  . aspirin 81 MG EC tablet Take 1 tablet (81 mg total) by mouth daily with breakfast. 90 tablet 3  . chlorpheniramine (CHLOR-TRIMETON) 4 MG tablet Take one tablet by mouth once daily, as needed, for uncontrolled allergies (Patient taking differently: Take 4 mg by mouth daily as needed. Take one tablet by mouth once daily, as needed, for uncontrolled allergies) 30 tablet 0  . clopidogrel (PLAVIX) 75 MG tablet  Take 1 tablet (75 mg total) by mouth daily. 90 tablet 1  . dapagliflozin propanediol (FARXIGA) 10 MG TABS tablet Take 1 tablet (10 mg total) by mouth daily before breakfast. 30 tablet 3  . esomeprazole (NEXIUM) 20 MG capsule Take 20 mg by mouth daily.     . famotidine (PEPCID) 20 MG tablet Take 1 tablet (20 mg total) by mouth at bedtime.    [name] FREESTYLE LITE test strip USE TO TEST FOUR TIMES DAILY 100 strip 0  . hydrOXYzine (ATARAX/VISTARIL) 25 MG tablet Take 25 mg by mouth at bedtime.     . insulin aspart (NOVOLOG FLEXPEN) 100 UNIT/ML FlexPen Max 30 units a day 15 mL 11  . insulin glargine (LANTUS SOLOSTAR) 100 UNIT/ML Solostar Pen Inject 50 Units into the skin at bedtime. 30 mL 6  . Insulin Pen Needle 32G X 4 MM MISC 1 Device by Does not apply route as directed. 400 each 3  . Melatonin 10 MG TABS Take 10 mg by mouth at bedtime.     . metoprolol succinate (TOPROL-XL) 100 MG 24 hr tablet Take 1 tablet (100  mg total) by mouth daily. TAKE 1 TABLET BY MOUTH EVERY DAY WITH FOOD (Patient taking differently: Take 100 mg by mouth daily. ) 30 tablet 3  . Multiple Vitamins-Minerals (MULTIVITAMIN WITH MINERALS) tablet Take 1 tablet by mouth daily. 120 tablet 2  . potassium chloride SA (KLOR-CON) 20 MEQ tablet Take 1 tablet (20 mEq total) by mouth daily. (Patient taking differently: Take 20 mEq by mouth daily as needed (cramping). ) 90 tablet 3  . rosuvastatin (CRESTOR) 40 MG tablet Take 1 tablet (40 mg total) by mouth daily. (Patient taking differently: Take 40 mg by mouth every 30 (thirty) days. ) 30 tablet 3  . Semaglutide (RYBELSUS) 3 MG TABS Take 3 mg by mouth in the morning. 30 tablet 6  . spironolactone (ALDACTONE) 25 MG tablet Take 1 tablet (25 mg total) by mouth daily. 90 tablet 3  . torsemide (DEMADEX) 20 MG tablet Take 2 tablets (40 mg total) by mouth 2 (two) times a week. Take on Mondays and Fridays. (Patient taking differently: Take 40 mg by mouth 2 (two) times a week. ) 48 tablet 3  . fluticasone  (FLONASE) 50 MCG/ACT nasal spray Place 2 sprays into both nostrils daily. (Patient taking differently: Place 2 sprays into both nostrils daily as needed for allergies. ) 16 g 6  . montelukast (SINGULAIR) 10 MG tablet Take 1 tablet (10 mg total) by mouth at bedtime. (Patient taking differently: Take 10 mg by mouth daily as needed (allergies). ) 30 tablet 3  . sertraline (ZOLOFT) 25 MG tablet Take 1 tablet (25 mg total) by mouth daily. For anxiety/panic attacks (Patient taking differently: Take 25 mg by mouth at bedtime. For anxiety/panic attacks) 30 tablet 5   No facility-administered medications prior to visit.     Allergies:   Entresto [sacubitril-valsartan] and Other   Social History   Socioeconomic History  . Marital status: Married    Spouse name: Not on file  . Number of children: Not on file  . Years of education: Not on file  . Highest education level: Not on file  Occupational History  . Occupation: Therapist, sports: [employer]  Tobacco Use  . Smoking status: Former Smoker    Packs/day: 0.25    Years: 30.00    Pack years: 7.50    Types: Cigars    Quit date: 05/23/2016    Years since quitting: 4.3  . Smokeless tobacco: Never Used  . Tobacco comment: since early 85s  Vaping Use  . Vaping Use: Never used  Substance and Sexual Activity  . Alcohol use: Yes    Alcohol/week: 0.0 standard drinks    Comment: occ  . Drug use: No  . Sexual activity: Not Currently  Other Topics Concern  . Not on file  Social History Narrative  . Not on file   Social Determinants of Health   Financial Resource Strain:   . Difficulty of Paying Living Expenses: Not on file  Food Insecurity:   . Worried About Charity fundraiser in the Last Year: Not on file  . Ran Out of Food in the Last Year: Not on file  Transportation Needs:   . Lack of Transportation (Medical): Not on file  . Lack of Transportation (Non-Medical): Not on file  Physical Activity:   . Days of Exercise per  Week: Not on file  . Minutes of Exercise per Session: Not on file  Stress:   . Feeling of Stress : Not on file  Social  Connections:   . Frequency of Communication with Friends and Family: Not on file  . Frequency of Social Gatherings with Friends and Family: Not on file  . Attends Religious Services: Not on file  . Active Member of Clubs or Organizations: Not on file  . Attends Archivist Meetings: Not on file  . Marital Status: Not on file     Family History:  The patient's family history includes Asthma in his sister; COPD in his father and mother; Diabetes in his mother; Emphysema in his father and mother; Heart attack in his mother; Heart disease in his mother; Hypertension in his mother; Stroke in his sister.   Review of Systems:   Please see the history of present illness.     General:  No chills, fever, night sweats or weight changes.  Cardiovascular:  No chest pain, dyspnea on exertion, edema, orthopnea, palpitations, paroxysmal nocturnal dyspnea. Dermatological: No rash, lesions/masses Respiratory: No cough, dyspnea Urologic: No hematuria, dysuria Abdominal:   No nausea, vomiting, diarrhea, bright red blood per rectum, melena, or hematemesis Neurologic:  No visual changes, wkns, changes in mental status. All other systems reviewed and are otherwise negative except as noted above.   Physical Exam:    VS:  BP 126/82   Pulse 82   Ht 5\' 10"  (1.778 m)   Wt 222 lb 9.6 oz (101 kg)   SpO2 96%   BMI 31.94 kg/m    General: Well developed, well nourished,male appearing in no acute distress. Head: Normocephalic, atraumatic. Neck: No carotid bruits. JVD not elevated.  Lungs: Respirations regular and unlabored, without wheezes or rales.  Heart: Regular rate and rhythm. No S3 or S4.  No murmur, no rubs, or gallops appreciated. Abdomen: No obvious abdominal masses. Appears distended.  Msk:  Strength and tone appear normal for age. No obvious joint deformities or  effusions. Extremities: No clubbing or cyanosis. No lower extremity edema.  Distal pedal pulses are 2+ bilaterally. Radial cath site without ecchymosis or evidence of a hematoma.  Neuro: Alert and oriented X 3. Moves all extremities spontaneously. No focal deficits noted. Psych:  Responds to questions appropriately with a normal affect. Skin: No rashes or lesions noted  Wt Readings from Last 3 Encounters:  10/02/20 222 lb 9.6 oz (101 kg)  09/25/20 221 lb (100.2 kg)  09/18/20 205 lb (93 kg)     Studies/Labs Reviewed:   EKG:  EKG is ordered today.  The ekg ordered today demonstrates NSR,  HR 84 with LVH and TWI along the lateral leads which is similar to prior tracings.   Recent Labs: 06/10/2020: Magnesium 2.2 07/11/2020: TSH 3.195 07/25/2020: ALT 35 08/21/2020: B Natriuretic Peptide 116.9 09/11/2020: BUN 25; Creatinine, Ser 1.49; Hemoglobin 12.7; Platelets 279; Potassium 4.8; Sodium 142   Lipid Panel    Component Value Date/Time   CHOL 242 (H) 06/09/2020 0744   TRIG 208 (H) 06/09/2020 0744   HDL 51 06/09/2020 0744   CHOLHDL 4.7 06/09/2020 0744   VLDL 42 (H) 06/09/2020 0744   LDLCALC 149 (H) 06/09/2020 0744   LDLCALC 82 10/23/2019 0955   LDLDIRECT TEST NOT PERFORMED 06/19/2011 1230    Additional studies/ records that were reviewed today include:   Cardiac Catheterization: 07/2020  Mid LAD lesion is 50% stenosed.  Ost 2nd Diag to 2nd Diag lesion is 70% stenosed.  Ost 1st Diag to 1st Diag lesion is 25% stenosed.  Ost Cx lesion is 85% stenosed.  Mid RCA lesion is 50% stenosed.  Previously placed  Prox Cx to Dist Cx stent (unknown type) is widely patent.  2nd Mrg lesion is 50% stenosed.  Dist Cx lesion is 50% stenosed.  1st Diag lesion is 90% stenosed.  Dist LAD lesion is 50% stenosed.   Findings:  RA = 5 RV = 28/2 PA = 31/11 (18) PCW =13 Fick cardiac output/index = 5.8/2.8 PVR = 0.9 WU Ao sat = 95% PA sat = 68%, 68%  Assessment: 1. 3v CAD with patent LCx  stent 2. There are high-grade lesions in the ostial LCX and mid D1 3. 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 Well-compensated hemodynamics  Plan/Discussion:  Hemodynamics well compensated. Suspect EF down out of proportion to CAD.   Will review with interventional team regarding PCI of ostial LCX and D1. Although I doubt these are promiennt contributors to his cardiomyopathy they are large vessels and have had progressive disease since 2017 and likely worth addressing giving falling EF.   Coronary Stent Intervention: 09/2020 1.  Severe stenosis of the ostium of the left circumflex, treated successfully with a 4.0 x 12 mm resolute Onyx DES 2.  Moderate stenosis of the first diagonal branch of the LAD, interrogated with pressure wire analysis, with DFR 0.91 (not hemodynamically significant) 3.  Moderate mid RCA stenosis of 60%, medical therapy recommended  Recommendations: Same-day PCI protocol.  Aspirin and clopidogrel for a minimum of 6 months (stable ischemic heart disease recommendation)   Assessment:    1. Chronic combined systolic (congestive) and diastolic (congestive) heart failure ([place])   2. Coronary artery disease involving native coronary artery of native heart without angina pectoris   3. Status post angioplasty with stent   4. Medication management   5. Essential hypertension   6. Hyperlipidemia LDL goal <70   7. Stage 3a chronic kidney disease ([place])      Plan:   In order of problems listed above:  1. Chronic Combined Systolic and Diastolic CHF - Most recent echocardiogram in 04/2020 showed a reduced EF of 20%. Would plan for repeat imaging once 3 months out from his recent coronary intervention. He has also reduced his alcohol use and was congratulated on this with cessation advised.  - He reports his breathing has been at baseline but he appears distended on examination and weight has increased from 212 lbs in 08/2020 to 222 lbs today. Torsemide was previously  reduced from 40mg  daily to twice weekly given AKI following initial cath. Will plan for a repeat BMET following his most recent intervention. I will reach out to Sharman Cheek to check his device status as his Heart Failure Index was 0 by interrogation on 09/16/2020. Pending labs, would anticipate increasing his diuretic to Torsemide 20mg  daily.  - Remains on Toprol-XL 100mg  daily and Spironolactone 25mg  daily. He is not sure if he resumed Losartan so pending repeat labs, would restart this. Previously intolerant to [employer]. Remains on Farxiga as well.   2. CAD - He is s/p STEMI in 2013 with DES to LCx, patent by repeat cath in 05/2016 with moderate residual CAD along RCA and LAD. Most recent cath showed a high-grade stenosis along the ostial LCx and he ultimately underwent staged PCI by Dr. Burt Knack on 09/18/2020 with DES placement to the ostial LCx.  - He denies any anginal symptoms.  - Continue ASA, Plavix, Toprol-XL and Crestor at current dosing.   3. HTN - BP is well-controlled at 126/82 during today's visit. Continue Toprol-XL and Spironolactone. Pending repeat labs, would plan to add back Losartan as  this was held prior to his recent PCI and it is unclear if he restarted it following the procedure.   4. HLD - LDL was elevated to 149 in 05/2020 but he had been off statin therapy at that time. Will need to recheck with upcoming labs. Remains on Crestor 40mg  daily with goal LDL less than 70 with known CAD.    5. Stage 3 CKD - Baseline creatinine 1.4 - 1.5. Elevated to 2.21 on 08/21/2020, improved to 1.49 on 9/29. Will recheck following his most recent cath.    Medication Adjustments/Labs and Tests Ordered: Current medicines are reviewed at length with the patient today.  Concerns regarding medicines are outlined above.  Medication changes, Labs and Tests ordered today are listed in the Patient Instructions below. Patient Instructions  Medication Instructions:  Your physician recommends that you  continue on your current medications as directed. Please refer to the Current Medication list given to you today.  *If you need a refill on your cardiac medications before your next appointment, please call your pharmacy*   Lab Work: Your physician recommends that you return for lab work in: Tomorrow   If you have labs (blood work) drawn today and your tests are completely normal, you will receive your results only by: [name] MyChart Message (if you have MyChart) OR . A paper copy in the mail If you have any lab test that is abnormal or we need to change your treatment, we will call you to review the results.   Testing/Procedures: NONE    Follow-Up: At [facility], you and your health needs are our priority.  As part of our continuing mission to provide you with exceptional heart care, we have created designated Provider Care Teams.  These Care Teams include your primary Cardiologist (physician) and Advanced Practice Providers (APPs -  Physician Assistants and Nurse Practitioners) who all work together to provide you with the care you need, when you need it.  We recommend signing up for the patient portal called "MyChart".  Sign up information is provided on this After Visit Summary.  MyChart is used to connect with patients for Virtual Visits (Telemedicine).  Patients are able to view lab/test results, encounter notes, upcoming appointments, etc.  Non-urgent messages can be sent to your provider as well.   To learn more about what you can do with MyChart, go to [email or web address].    Your next appointment:    As planned   The format for your next appointment:   In Person  Provider:   Dr. Haroldine Laws  Other Instructions Thank you for choosing [facility]!       Signed, Erma Heritage, PA-C  10/02/2020 8:19 PM    [facility] [place], [place] [address] Phone: [phone] Fax: [phone]

## 2020-10-02 NOTE — Patient Instructions (Signed)
Medication Instructions:  Your physician recommends that you continue on your current medications as directed. Please refer to the Current Medication list given to you today.  *If you need a refill on your cardiac medications before your next appointment, please call your pharmacy*   Lab Work: Your physician recommends that you return for lab work in: Tomorrow   If you have labs (blood work) drawn today and your tests are completely normal, you will receive your results only by: [name] MyChart Message (if you have MyChart) OR . A paper copy in the mail If you have any lab test that is abnormal or we need to change your treatment, we will call you to review the results.   Testing/Procedures: NONE    Follow-Up: At [facility], you and your health needs are our priority.  As part of our continuing mission to provide you with exceptional heart care, we have created designated Provider Care Teams.  These Care Teams include your primary Cardiologist (physician) and Advanced Practice Providers (APPs -  Physician Assistants and Nurse Practitioners) who all work together to provide you with the care you need, when you need it.  We recommend signing up for the patient portal called "MyChart".  Sign up information is provided on this After Visit Summary.  MyChart is used to connect with patients for Virtual Visits (Telemedicine).  Patients are able to view lab/test results, encounter notes, upcoming appointments, etc.  Non-urgent messages can be sent to your provider as well.   To learn more about what you can do with MyChart, go to [email or web address].    Your next appointment:    As planned   The format for your next appointment:   In Person  Provider:   Dr. Haroldine Laws  Other Instructions Thank you for choosing [facility]!

## 2020-10-04 ENCOUNTER — Other Ambulatory Visit (HOSPITAL_COMMUNITY)
Admission: RE | Admit: 2020-10-04 | Discharge: 2020-10-04 | Disposition: A | Discharge status: Home / Self Care | Payer: BC Managed Care – PPO | Source: Ambulatory Visit | Attending: Student | Admitting: Student

## 2020-10-04 ENCOUNTER — Other Ambulatory Visit: Payer: Self-pay

## 2020-10-04 DIAGNOSIS — Z79899 Other long term (current) drug therapy: Secondary | ICD-10-CM | POA: Insufficient documentation

## 2020-10-04 LAB — BASIC METABOLIC PANEL
Anion gap: 12 (ref 5–15)
BUN: 32 mg/dL — ABNORMAL HIGH (ref 6–20)
CO2: 27 mmol/L (ref 22–32)
Calcium: 9.2 mg/dL (ref 8.9–10.3)
Chloride: 98 mmol/L (ref 98–111)
Creatinine, Ser: 2.09 mg/dL — ABNORMAL HIGH (ref 0.61–1.24)
GFR, Estimated: 36 mL/min — ABNORMAL LOW (ref >60)
Glucose, Bld: 178 mg/dL — ABNORMAL HIGH (ref 70–99)
Potassium: 3.9 mmol/L (ref 3.5–5.1)
Sodium: 137 mmol/L (ref 135–145)

## 2020-10-07 ENCOUNTER — Telehealth: Payer: Self-pay | Admitting: *Deleted

## 2020-10-07 DIAGNOSIS — Z79899 Other long term (current) drug therapy: Secondary | ICD-10-CM

## 2020-10-07 NOTE — Telephone Encounter (Signed)
Pt notified and order placed 

## 2020-10-07 NOTE — Telephone Encounter (Signed)
-----   Message from Erma Heritage, [place] sent at 10/05/2020  9:14 AM EDT ----- Please let the patient know his electrolytes remain stable but renal function has worsened when compared to his pre-cath labs. I did reach out to Sharman Cheek and she said his Heartlogic Index was normal with no evidence of fluid and she will continue to monitor this. Would continue with Torsemide 40mg  twice weekly unless he develops worsening edema or weight changes by 2 lbs overnight or 5 lbs in one week, as he should then take an extra Torsemide. Please confirm he has remained off Losartan for now and is not taking NSAIDS. Repeat BMET again in 2-3 weeks for reassessment.

## 2020-10-10 DIAGNOSIS — G4733 Obstructive sleep apnea (adult) (pediatric): Secondary | ICD-10-CM | POA: Diagnosis not present

## 2020-10-14 ENCOUNTER — Telehealth: Payer: Self-pay | Admitting: Family Medicine

## 2020-10-14 NOTE — Telephone Encounter (Signed)
Patients wife called to let us know he passed away on Oct 15, 2020

## 2020-10-14 DEATH — deceased

## 2020-10-18 ENCOUNTER — Telehealth: Payer: Self-pay

## 2020-10-18 NOTE — Telephone Encounter (Signed)
LMOM with device clinic # and office hours.  Last transmission received was 10/07/20 and last episode recorded was 09/12/20  At 0126 on that transmission.

## 2020-10-18 NOTE — Telephone Encounter (Signed)
The pt wife wanted to know if the pt ICD shocked him last week. I told the wife I will have the nurse call her back. Her phone number is [phone].

## 2020-10-23 ENCOUNTER — Other Ambulatory Visit: Payer: Self-pay | Admitting: Family Medicine

## 2020-11-21 ENCOUNTER — Encounter (HOSPITAL_COMMUNITY): Payer: BC Managed Care – PPO | Admitting: Internal Medicine

## 2020-12-03 ENCOUNTER — Encounter: Payer: BC Managed Care – PPO | Admitting: Family Medicine

## 2021-01-01 ENCOUNTER — Ambulatory Visit: Payer: BC Managed Care – PPO | Admitting: Internal Medicine

## 2021-01-11 ENCOUNTER — Other Ambulatory Visit: Payer: Self-pay | Admitting: Internal Medicine

## 2021-08-19 IMAGING — DX DG CHEST 2V
2 series · 2 of 2 positions shown · non-contrast
Comparison: 01/23/2020

CLINICAL DATA: Productive cough with rust colored sputum for
several days

EXAM:
CHEST - 2 VIEW

[chest pa]
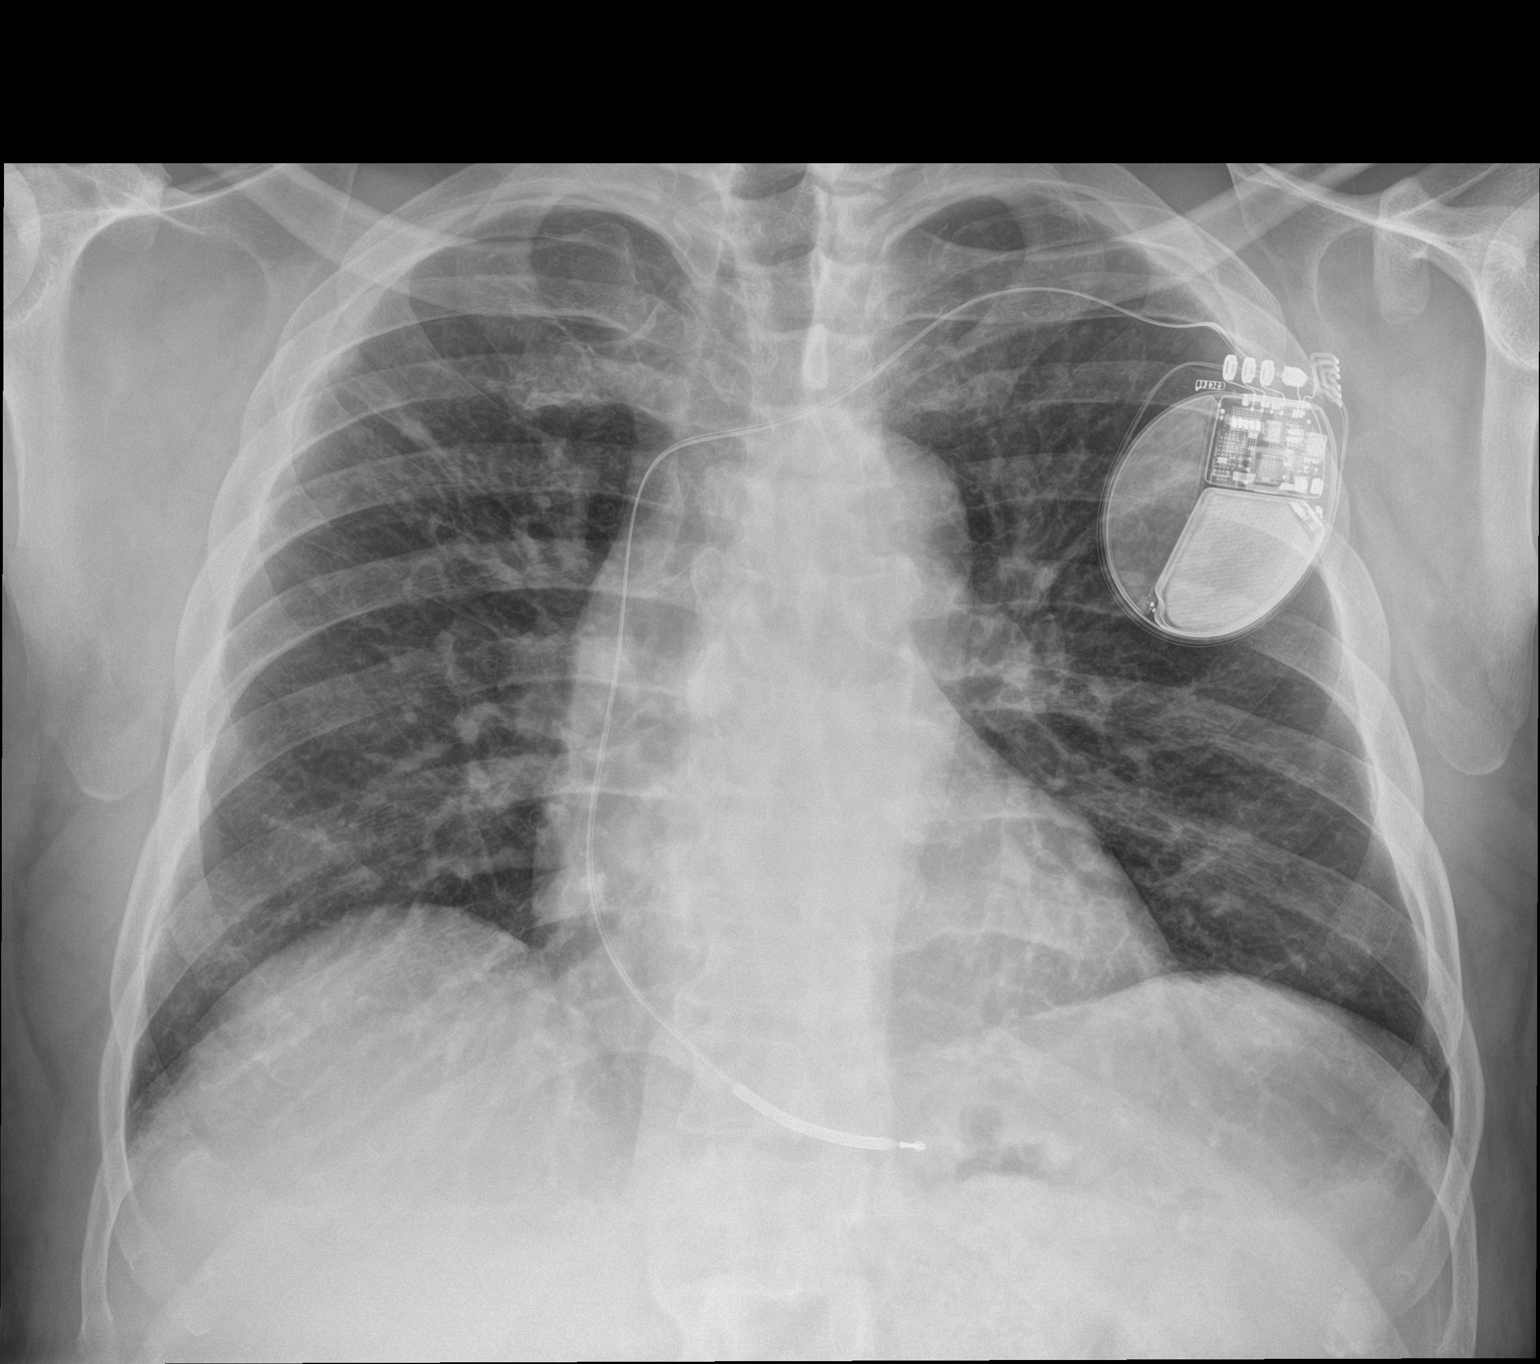

[chest lat]
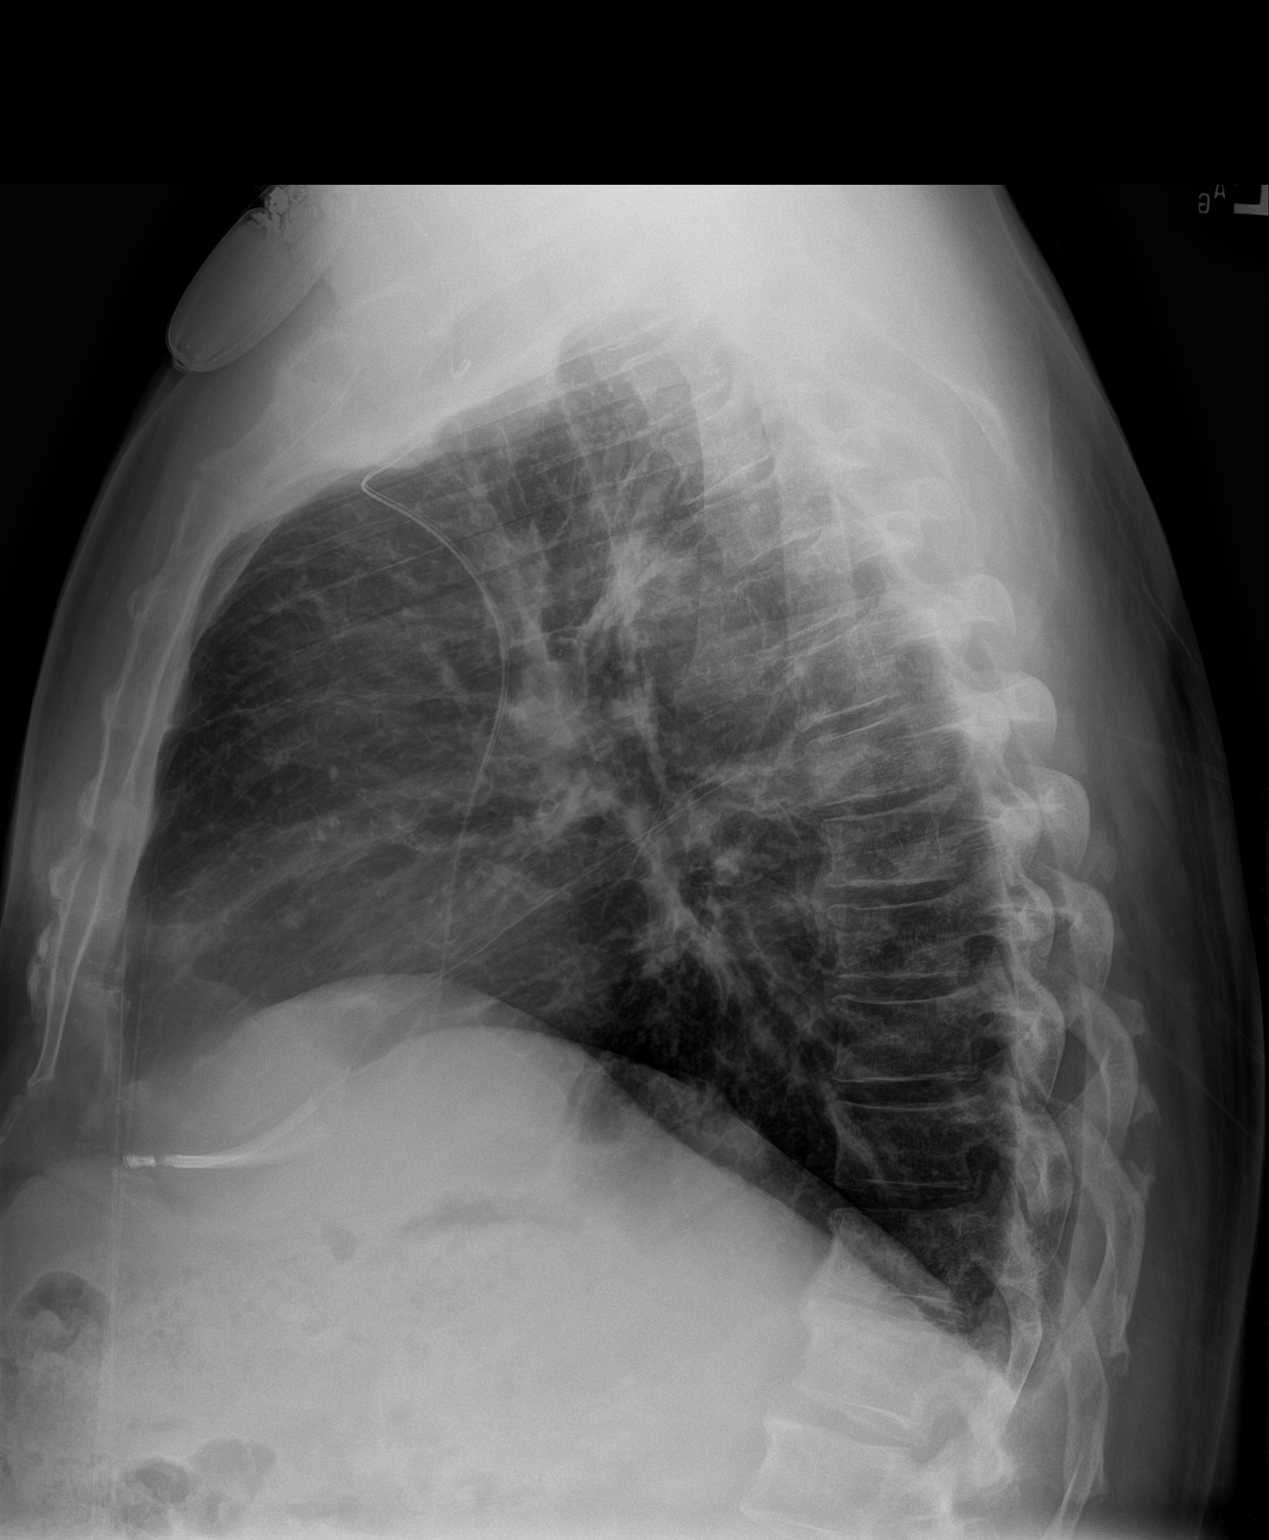

[2 of 2 positions shown; findings below may reference images not displayed]

FINDINGS: Cardiac shadow is within normal limits. Defibrillator is again noted
and stable. The lungs are well aerated bilaterally. Previously seen
prominent interstitial markings are again identified and stable. No
sizable infiltrate or effusion is noted. No acute bony abnormality
is seen.
IMPRESSION: Stable interstitial markings bilaterally. No acute abnormality is
seen.

## 2021-10-01 IMAGING — DX DG CHEST 1V PORT
1 series · 1 of 1 positions shown · non-contrast
Comparison: Radiograph 05/08/2020

CLINICAL DATA: Acute respiratory failure and acute systolic CHF

EXAM:
PORTABLE CHEST 1 VIEW

[chest ap grid]
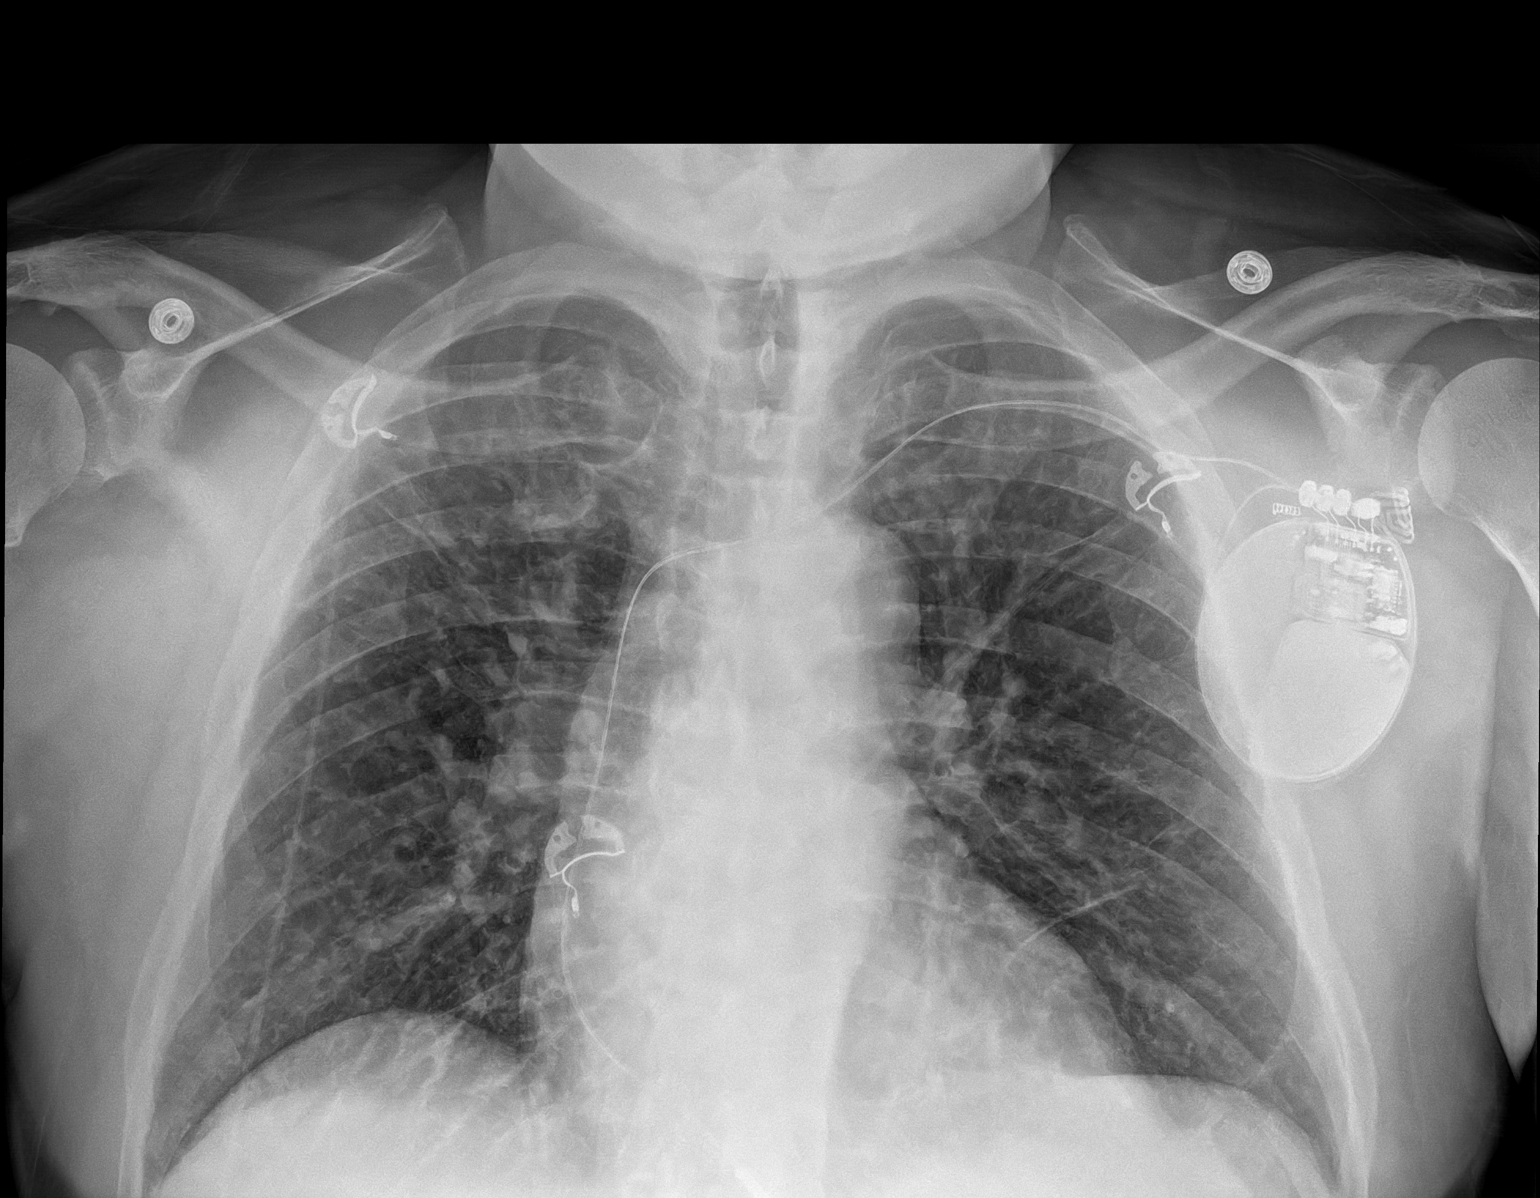

[1 of 1 positions shown; findings below may reference images not displayed]

FINDINGS: Pacer pack overlies the left chest wall with lead in stable position
directed towards the cardiac apex. Increasing interstitial opacities
with central vascular congestion. No focal consolidation,
pneumothorax or visible effusion. Cardiomediastinal contours are
unchanged from prior. No acute osseous or soft tissue abnormality.
IMPRESSION: Increasing interstitial opacities with central vascular congestion
could reflect developing edema or less likely infection.

## 2021-10-02 IMAGING — DX DG CHEST 1V PORT
1 series · 1 of 1 positions shown · non-contrast
Comparison: One-view chest x-ray 05/10/2020

CLINICAL DATA: Shortness of breath.

EXAM:
PORTABLE CHEST 1 VIEW

[chest ap]
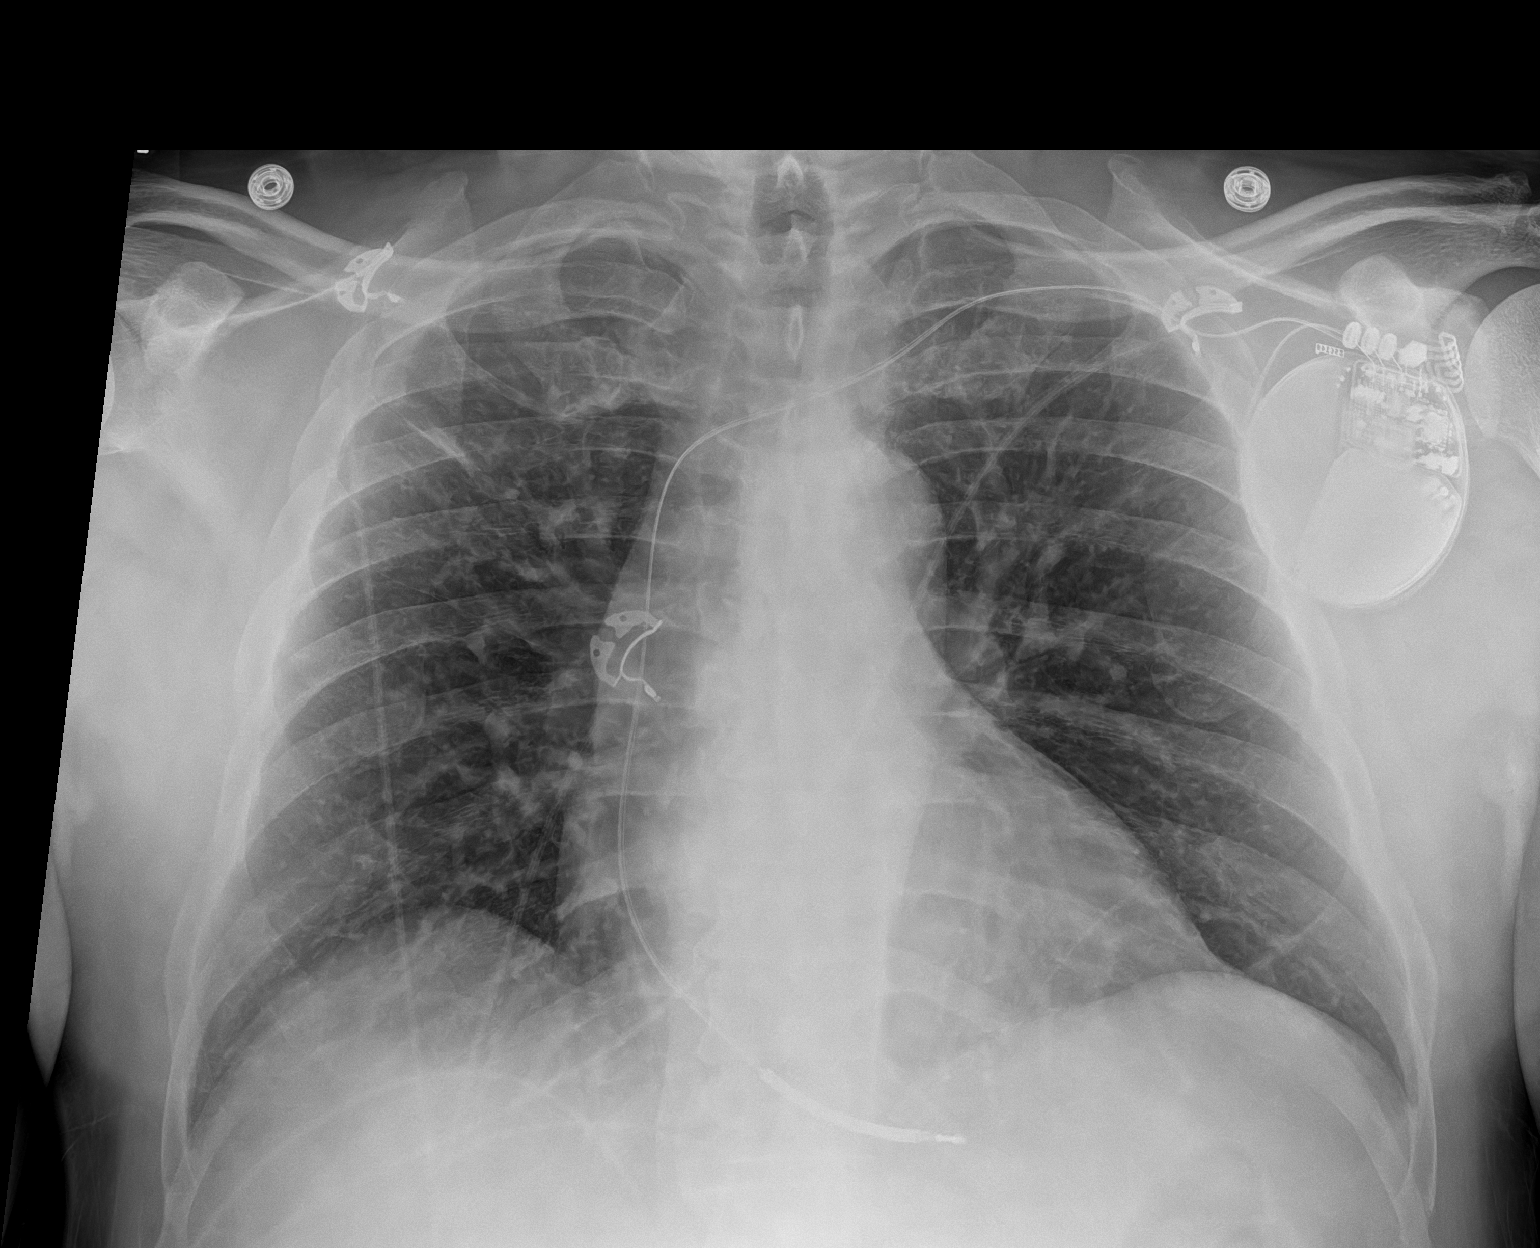

[1 of 1 positions shown; findings below may reference images not displayed]

FINDINGS: Heart size is normal. Pacing wire stable. Minimal atelectasis is
present in the right upper lobe. Mild pulmonary vascular congestion
is slightly improved.
IMPRESSION: Slight improvement in mild pulmonary vascular congestion.
